# Patient Record
Sex: Male | Born: 1935
Health system: Southern US, Community
[De-identification: ages and names within clinical notes are randomized; demographics above are authoritative.]

## PROBLEM LIST (undated history)

## (undated) DIAGNOSIS — M1990 Unspecified osteoarthritis, unspecified site: Secondary | ICD-10-CM

## (undated) DIAGNOSIS — R202 Paresthesia of skin: Secondary | ICD-10-CM

## (undated) DIAGNOSIS — K449 Diaphragmatic hernia without obstruction or gangrene: Secondary | ICD-10-CM

## (undated) DIAGNOSIS — I10 Essential (primary) hypertension: Secondary | ICD-10-CM

## (undated) DIAGNOSIS — H5440 Blindness, one eye, unspecified eye: Secondary | ICD-10-CM

## (undated) DIAGNOSIS — K4090 Unilateral inguinal hernia, without obstruction or gangrene, not specified as recurrent: Secondary | ICD-10-CM

## (undated) DIAGNOSIS — I779 Disorder of arteries and arterioles, unspecified: Secondary | ICD-10-CM

## (undated) DIAGNOSIS — H409 Unspecified glaucoma: Secondary | ICD-10-CM

## (undated) DIAGNOSIS — K3184 Gastroparesis: Secondary | ICD-10-CM

## (undated) DIAGNOSIS — K5792 Diverticulitis of intestine, part unspecified, without perforation or abscess without bleeding: Secondary | ICD-10-CM

## (undated) DIAGNOSIS — E785 Hyperlipidemia, unspecified: Secondary | ICD-10-CM

## (undated) DIAGNOSIS — K5900 Constipation, unspecified: Secondary | ICD-10-CM

## (undated) DIAGNOSIS — F17200 Nicotine dependence, unspecified, uncomplicated: Secondary | ICD-10-CM

## (undated) DIAGNOSIS — R200 Anesthesia of skin: Secondary | ICD-10-CM

## (undated) DIAGNOSIS — N529 Male erectile dysfunction, unspecified: Secondary | ICD-10-CM

## (undated) DIAGNOSIS — I519 Heart disease, unspecified: Secondary | ICD-10-CM

## (undated) DIAGNOSIS — R269 Unspecified abnormalities of gait and mobility: Secondary | ICD-10-CM

## (undated) DIAGNOSIS — N3281 Overactive bladder: Secondary | ICD-10-CM

## (undated) DIAGNOSIS — M5410 Radiculopathy, site unspecified: Secondary | ICD-10-CM

## (undated) DIAGNOSIS — G4730 Sleep apnea, unspecified: Secondary | ICD-10-CM

## (undated) DIAGNOSIS — K219 Gastro-esophageal reflux disease without esophagitis: Secondary | ICD-10-CM

## (undated) DIAGNOSIS — G4700 Insomnia, unspecified: Secondary | ICD-10-CM

## (undated) DIAGNOSIS — R413 Other amnesia: Secondary | ICD-10-CM

## (undated) DIAGNOSIS — I739 Peripheral vascular disease, unspecified: Secondary | ICD-10-CM

## (undated) DIAGNOSIS — R0602 Shortness of breath: Secondary | ICD-10-CM

## (undated) DIAGNOSIS — K635 Polyp of colon: Secondary | ICD-10-CM

## (undated) DIAGNOSIS — M5030 Other cervical disc degeneration, unspecified cervical region: Secondary | ICD-10-CM

## (undated) DIAGNOSIS — I639 Cerebral infarction, unspecified: Secondary | ICD-10-CM

## (undated) HISTORY — DX: Peripheral vascular disease, unspecified: I73.9

## (undated) HISTORY — DX: Radiculopathy, site unspecified: M54.10

## (undated) HISTORY — DX: Nicotine dependence, unspecified, uncomplicated: F17.200

## (undated) HISTORY — DX: Hyperlipidemia, unspecified: E78.5

## (undated) HISTORY — DX: Male erectile dysfunction, unspecified: N52.9

## (undated) HISTORY — PX: CERVICAL FUSION: SHX112

## (undated) HISTORY — DX: Diverticulitis of intestine, part unspecified, without perforation or abscess without bleeding: K57.92

## (undated) HISTORY — DX: Polyp of colon: K63.5

## (undated) HISTORY — DX: Insomnia, unspecified: G47.00

## (undated) HISTORY — DX: Anesthesia of skin: R20.0

## (undated) HISTORY — DX: Overactive bladder: N32.81

## (undated) HISTORY — PX: HERNIA REPAIR: SHX51

## (undated) HISTORY — DX: Diaphragmatic hernia without obstruction or gangrene: K44.9

## (undated) HISTORY — DX: Unspecified abnormalities of gait and mobility: R26.9

## (undated) HISTORY — DX: Gastroparesis: K31.84

## (undated) HISTORY — PX: COLONOSCOPY: SHX174

## (undated) HISTORY — DX: Other cervical disc degeneration, unspecified cervical region: M50.30

## (undated) HISTORY — DX: Blindness, one eye, unspecified eye: H54.40

## (undated) HISTORY — DX: Other amnesia: R41.3

## (undated) HISTORY — DX: Unspecified osteoarthritis, unspecified site: M19.90

## (undated) HISTORY — DX: Unilateral inguinal hernia, without obstruction or gangrene, not specified as recurrent: K40.90

## (undated) HISTORY — DX: Heart disease, unspecified: I51.9

## (undated) HISTORY — DX: Disorder of arteries and arterioles, unspecified: I77.9

## (undated) HISTORY — DX: Paresthesia of skin: R20.2

---

## 1999-08-29 ENCOUNTER — Ambulatory Visit (HOSPITAL_COMMUNITY): Admission: AD | Admit: 1999-08-29 | Discharge: 1999-08-30 | Payer: Self-pay | Admitting: Ophthalmology

## 1999-08-29 ENCOUNTER — Encounter: Payer: Self-pay | Admitting: Ophthalmology

## 2000-09-10 ENCOUNTER — Encounter: Payer: Self-pay | Admitting: General Surgery

## 2000-09-10 ENCOUNTER — Ambulatory Visit (HOSPITAL_COMMUNITY): Admission: RE | Admit: 2000-09-10 | Discharge: 2000-09-10 | Payer: Self-pay | Admitting: General Surgery

## 2000-12-28 ENCOUNTER — Encounter: Payer: Self-pay | Admitting: Family Medicine

## 2000-12-28 ENCOUNTER — Ambulatory Visit (HOSPITAL_COMMUNITY): Admission: RE | Admit: 2000-12-28 | Discharge: 2000-12-28 | Payer: Self-pay | Admitting: Family Medicine

## 2001-02-01 ENCOUNTER — Ambulatory Visit (HOSPITAL_COMMUNITY): Admission: RE | Admit: 2001-02-01 | Discharge: 2001-02-01 | Payer: Self-pay | Admitting: Family Medicine

## 2001-02-01 ENCOUNTER — Encounter: Payer: Self-pay | Admitting: Family Medicine

## 2001-02-03 ENCOUNTER — Ambulatory Visit (HOSPITAL_COMMUNITY): Admission: RE | Admit: 2001-02-03 | Discharge: 2001-02-03 | Payer: Self-pay | Admitting: Family Medicine

## 2001-02-03 ENCOUNTER — Encounter: Payer: Self-pay | Admitting: Family Medicine

## 2001-02-15 ENCOUNTER — Ambulatory Visit (HOSPITAL_COMMUNITY): Admission: RE | Admit: 2001-02-15 | Discharge: 2001-02-15 | Payer: Self-pay | Admitting: Family Medicine

## 2001-02-15 ENCOUNTER — Encounter: Payer: Self-pay | Admitting: Family Medicine

## 2001-04-29 ENCOUNTER — Emergency Department (HOSPITAL_COMMUNITY): Admission: EM | Admit: 2001-04-29 | Discharge: 2001-04-29 | Payer: Self-pay | Admitting: Emergency Medicine

## 2002-04-14 ENCOUNTER — Ambulatory Visit (HOSPITAL_COMMUNITY): Admission: RE | Admit: 2002-04-14 | Discharge: 2002-04-14 | Payer: Self-pay | Admitting: Internal Medicine

## 2002-04-14 HISTORY — PX: ESOPHAGOGASTRODUODENOSCOPY: SHX1529

## 2002-07-08 ENCOUNTER — Ambulatory Visit (HOSPITAL_COMMUNITY): Admission: RE | Admit: 2002-07-08 | Discharge: 2002-07-08 | Payer: Self-pay | Admitting: Family Medicine

## 2002-07-08 ENCOUNTER — Encounter: Payer: Self-pay | Admitting: Family Medicine

## 2002-08-04 ENCOUNTER — Ambulatory Visit (HOSPITAL_COMMUNITY): Admission: RE | Admit: 2002-08-04 | Discharge: 2002-08-04 | Payer: Self-pay | Admitting: Internal Medicine

## 2002-08-04 HISTORY — PX: COLONOSCOPY: SHX174

## 2003-10-16 ENCOUNTER — Ambulatory Visit (HOSPITAL_COMMUNITY): Admission: RE | Admit: 2003-10-16 | Discharge: 2003-10-16 | Payer: Self-pay | Admitting: Neurology

## 2003-11-16 ENCOUNTER — Ambulatory Visit (HOSPITAL_COMMUNITY): Admission: RE | Admit: 2003-11-16 | Discharge: 2003-11-16 | Payer: Self-pay | Admitting: Family Medicine

## 2003-11-22 ENCOUNTER — Emergency Department (HOSPITAL_COMMUNITY): Admission: EM | Admit: 2003-11-22 | Discharge: 2003-11-22 | Payer: Self-pay | Admitting: Emergency Medicine

## 2003-12-02 ENCOUNTER — Ambulatory Visit (HOSPITAL_COMMUNITY): Admission: RE | Admit: 2003-12-02 | Discharge: 2003-12-02 | Payer: Self-pay | Admitting: Orthopaedic Surgery

## 2004-01-22 ENCOUNTER — Ambulatory Visit (HOSPITAL_COMMUNITY): Admission: RE | Admit: 2004-01-22 | Discharge: 2004-01-22 | Payer: Self-pay | Admitting: Family Medicine

## 2004-04-22 ENCOUNTER — Ambulatory Visit (HOSPITAL_COMMUNITY): Admission: RE | Admit: 2004-04-22 | Discharge: 2004-04-22 | Payer: Self-pay | Admitting: Neurology

## 2004-04-26 ENCOUNTER — Inpatient Hospital Stay (HOSPITAL_COMMUNITY): Admission: RE | Admit: 2004-04-26 | Discharge: 2004-04-27 | Payer: Self-pay | Admitting: Neurosurgery

## 2004-06-02 ENCOUNTER — Encounter: Admission: RE | Admit: 2004-06-02 | Discharge: 2004-06-02 | Payer: Self-pay | Admitting: Neurosurgery

## 2004-12-22 ENCOUNTER — Ambulatory Visit (HOSPITAL_COMMUNITY): Admission: RE | Admit: 2004-12-22 | Discharge: 2004-12-22 | Payer: Self-pay | Admitting: Family Medicine

## 2006-03-15 ENCOUNTER — Ambulatory Visit: Payer: Self-pay | Admitting: Internal Medicine

## 2006-04-19 ENCOUNTER — Ambulatory Visit (HOSPITAL_COMMUNITY): Admission: RE | Admit: 2006-04-19 | Discharge: 2006-04-19 | Payer: Self-pay | Admitting: *Deleted

## 2006-04-23 ENCOUNTER — Ambulatory Visit: Payer: Self-pay | Admitting: Internal Medicine

## 2006-07-23 ENCOUNTER — Ambulatory Visit: Payer: Self-pay | Admitting: Orthopedic Surgery

## 2006-07-25 DIAGNOSIS — Z8679 Personal history of other diseases of the circulatory system: Secondary | ICD-10-CM | POA: Insufficient documentation

## 2006-07-27 ENCOUNTER — Ambulatory Visit (HOSPITAL_COMMUNITY): Admission: RE | Admit: 2006-07-27 | Discharge: 2006-07-27 | Payer: Self-pay | Admitting: Orthopedic Surgery

## 2006-08-06 ENCOUNTER — Ambulatory Visit: Payer: Self-pay | Admitting: Orthopedic Surgery

## 2006-08-12 ENCOUNTER — Emergency Department (HOSPITAL_COMMUNITY): Admission: EM | Admit: 2006-08-12 | Discharge: 2006-08-12 | Payer: Self-pay | Admitting: Emergency Medicine

## 2006-10-29 ENCOUNTER — Ambulatory Visit (HOSPITAL_COMMUNITY): Admission: RE | Admit: 2006-10-29 | Discharge: 2006-10-29 | Payer: Self-pay | Admitting: Family Medicine

## 2007-08-26 ENCOUNTER — Ambulatory Visit (HOSPITAL_COMMUNITY): Admission: RE | Admit: 2007-08-26 | Discharge: 2007-08-26 | Payer: Self-pay | Admitting: Family Medicine

## 2008-09-08 ENCOUNTER — Encounter: Admission: RE | Admit: 2008-09-08 | Discharge: 2008-09-08 | Payer: Self-pay | Admitting: Specialist

## 2008-09-09 DIAGNOSIS — K5900 Constipation, unspecified: Secondary | ICD-10-CM

## 2008-09-09 DIAGNOSIS — Z8673 Personal history of transient ischemic attack (TIA), and cerebral infarction without residual deficits: Secondary | ICD-10-CM

## 2008-09-09 DIAGNOSIS — I10 Essential (primary) hypertension: Secondary | ICD-10-CM | POA: Insufficient documentation

## 2008-09-09 DIAGNOSIS — F17200 Nicotine dependence, unspecified, uncomplicated: Secondary | ICD-10-CM

## 2008-11-19 ENCOUNTER — Encounter: Admission: RE | Admit: 2008-11-19 | Discharge: 2008-11-19 | Payer: Self-pay | Admitting: Neurology

## 2009-05-27 ENCOUNTER — Ambulatory Visit: Payer: Self-pay | Admitting: Internal Medicine

## 2009-05-27 DIAGNOSIS — K219 Gastro-esophageal reflux disease without esophagitis: Secondary | ICD-10-CM

## 2009-05-27 DIAGNOSIS — R1310 Dysphagia, unspecified: Secondary | ICD-10-CM | POA: Insufficient documentation

## 2009-05-27 DIAGNOSIS — R198 Other specified symptoms and signs involving the digestive system and abdomen: Secondary | ICD-10-CM

## 2009-06-09 ENCOUNTER — Ambulatory Visit: Payer: Self-pay | Admitting: Internal Medicine

## 2009-06-09 ENCOUNTER — Ambulatory Visit (HOSPITAL_COMMUNITY): Admission: RE | Admit: 2009-06-09 | Discharge: 2009-06-09 | Payer: Self-pay | Admitting: Internal Medicine

## 2009-06-09 HISTORY — PX: ESOPHAGOGASTRODUODENOSCOPY: SHX1529

## 2009-06-11 ENCOUNTER — Encounter: Payer: Self-pay | Admitting: Internal Medicine

## 2009-07-08 ENCOUNTER — Encounter: Payer: Self-pay | Admitting: Internal Medicine

## 2010-02-01 NOTE — Letter (Signed)
Summary: tcs/egd/ed order  tcs/egd/ed order   Imported By: Ave Filter 05/27/2009 11:51:10  _____________________________________________________________________  External Attachment:    Type:   Image     Comment:   External Document

## 2010-02-01 NOTE — Letter (Signed)
Summary: Patient Notice, Colon Biopsy Results  [facility]  [address]   [place], [place] [address]   Phone: [phone]  Fax: [phone]       June 11, 2009   CHESKY HEYER [address] [facility] [place], [place]  [address] 05-Jun-1934    Dear Mr. Tenorio,  I am pleased to inform you that the biopsies taken during your recent colonoscopy did not show any evidence of cancer upon pathologic examination.  Additional information/recommendations:  You should have a repeat colonoscopy examination  in 3 years.  Please call us if you are having persistent problems or have questions about your condition that have not been fully answered at this time.  Sincerely,    R. Roetta Sessions MD, FACP [facility] Ph: [phone]    Fax: [phone]   Appended Document: Patient Notice, Colon Biopsy Results letter mailed to pt  Appended Document: Patient Notice, Colon Biopsy Results 52yr TCS reminder appt made- cdg

## 2010-02-01 NOTE — Assessment & Plan Note (Signed)
Summary: DSYPHAGIA/SS   Primary Care Provider:  Sherryll Burger  Chief Complaint:  dysphagia/worsening constipation.  History of Present Illness: [name] is a pleasant 76 y/o AA male, who preents today for further evaluation of dysphagia, worsening constipation. He had EGD/ED in 2004. Gradually over last year he has increased difficulty swallowing dry foods and meats. Feels like food is sticking. No typical gerd on omeprazole. He has intermittent vague substernal chest discomfort which radiated throughout chest at rest. Denies diaphoresis or exertional component. Denies cough. Denies abd pain but feels bloated. BM once per week. Takes Epson salt as needed. Tried colace 3 daily without results. Used to be on Amitiza with good control. No melena, brbpr. No weight loss.     Current Medications (verified): 1)  Docusate Sodium 100 Mg Tabs (Docusate Sodium) .... 3 As Needed, But Not Working So Stopped 2)  Furosemide 20 Mg Tabs (Furosemide) .... Take 1 Tablet By Mouth Once A Day 3)  Metoprolol Tartrate 25 Mg Tabs (Metoprolol Tartrate) .... Take 1 Tablet By Mouth Two Times A Day 4)  Omeprazole 20 Mg Cpdr (Omeprazole) .... Take 1 Tablet By Mouth Two Times A Day 5)  Klor-Con 20 Meq Pack (Potassium Chloride) .... Take 1 Tablet By Mouth Once A Day 6)  Aspir-Low 81 Mg Tbec (Aspirin) .... Take 1 Tablet By Mouth Once A Day 7)  Clonazepam 0.5 Mg Tabs (Clonazepam) .... Take 1 Tablet By Mouth Two Times A Day 8)  Simvastatin 20 Mg Tabs (Simvastatin) .... Take 1 Tablet By Mouth Once A Day  Allergies (verified): 1)  ! Aspirin  Past History:  Past Medical History: Chronic constipation Hypertension GERD CVA TCS, 2004-->pancolonic diverticula, inflammatory polyps EGD, 2004-->Schatzki ring, s/p dilation 60F, focal antral erosions Hyperlipidemia  Past Surgical History: Reviewed history from 09/09/2008 and no changes required. RIGHT INGUINAL HERNIA REPAIR VERTEBRA REPAIRED IN HIS NECK  Family  History: Mother and Father deceased, unknown cause. No FH chronic GI illnesses, CRC, liver disease. Brothers, 2, had either prostate or crc.  Social History: Married. 2 children. Retired. Smokes 5 cig/day. No alcohol or drugs.   Review of Systems General:  Complains of weakness; denies fever, chills, sweats, anorexia, fatigue, and weight loss; left-sided, prior cva. Eyes:  Denies vision loss. ENT:  Complains of difficulty swallowing; denies nasal congestion, sore throat, and hoarseness. CV:  Denies chest pains, angina, palpitations, dyspnea on exertion, and peripheral edema. Resp:  Denies dyspnea at rest, dyspnea with exercise, and cough. GI:  See HPI. GU:  Denies urinary burning and blood in urine. MS:  Denies joint pain / LOM. Derm:  Denies rash and itching. Neuro:  Complains of weakness; denies frequent headaches, memory loss, and confusion. Psych:  Denies depression and anxiety. Endo:  Denies unusual weight change. Heme:  Denies bruising and bleeding. Allergy:  Denies hives and rash.  Vital Signs:  Patient profile:   76 year old male Height:      69 inches Weight:      195 pounds BMI:     28.90 Temp:     98.5 degrees F oral Pulse rate:   80 / minute BP sitting:   158 / 80  (left arm) Cuff size:   regular  Vitals Entered By: Cloria Spring LPN (May 27, 2009 10:42 AM)  Physical Exam  General:  Well developed, well nourished, no acute distress. Head:  Normocephalic and atraumatic. Eyes:  Conjunctivae pink, no scleral icterus.  Mouth:  Oropharyngeal mucosa moist, pink.  No lesions, erythema  or exudate.   poor dentition.   Neck:  Supple; no masses or thyromegaly. Lungs:  Clear throughout to auscultation. Heart:  Regular rate and rhythm; no murmurs, rubs,  or bruits. Abdomen:  Bowel sounds normal.  Abdomen is soft, nontender, nondistended.  No rebound or guarding.  No hepatosplenomegaly, masses or hernias.  No abdominal bruits.  Rectal:  deferred until time of colonoscopy.    Extremities:  No clubbing, cyanosis, edema or deformities noted. Neurologic:  Alert and  oriented x4  Skin:  Intact without significant lesions or rashes. Psych:  Alert and cooperative. Normal mood and affect.  Impression & Recommendations:  Problem # 1:  DYSPHAGIA UNSPECIFIED (ICD-787.20)  Recurrent dysphagia, h/o remote Schatzki ring s/p dilation in 2004. He has chronic controlled GERD. Recommend EGD/ED. EGD/ED to be performed in near future.  Risks, alternatives, benefits including but not limited to risk of reaction to medications, bleeding, infection, and perforation addressed.  Patient voiced understanding and verbal consent obtained.   Orders: Est. Patient Level IV (09811)  Problem # 2:  CHANGE IN BOWELS (ICD-787.99)  Worsening constipation. BM once per week with use of Epson Salt. Last TCS in 2004, inflammatory polyps. No clear FH of CRC. Given change in bowels, repeat TCS at this time. He will have modified prep for severe constipation. Colonoscopy to be performed in near future.  Risks, alternatives, and benefits including but not limited to the risk of reaction to medication, bleeding, infection, and perforation were addressed.  Patient voiced understanding and provided verbal consent. Will start Amitiza by mouth two times a day with food. RX and samples provided.   Orders: Est. Patient Level IV (91478)  Problem # 3:  Hx of CVA (ICD-434.91) He will be maintained on ASA for procedures. Prescriptions: AMITIZA 24 MCG CAPS (LUBIPROSTONE) one by mouth two times a day with food  #60 x 5   Entered and Authorized by:   Leanna Battles. Dixon Boos   Signed by:   Leanna Battles Joelly Bolanos PA-C on 05/27/2009   Method used:   Electronically to        [facility] (retail)       [address]       [place], [place]  [address]       Ph: [phone]       Fax: [phone]   RxID:   [id number]

## 2010-05-03 ENCOUNTER — Other Ambulatory Visit: Payer: Self-pay | Admitting: Family Medicine

## 2010-05-03 DIAGNOSIS — K219 Gastro-esophageal reflux disease without esophagitis: Secondary | ICD-10-CM

## 2010-05-03 DIAGNOSIS — K222 Esophageal obstruction: Secondary | ICD-10-CM

## 2010-05-05 ENCOUNTER — Other Ambulatory Visit: Payer: Self-pay | Admitting: Family Medicine

## 2010-05-05 DIAGNOSIS — M545 Low back pain: Secondary | ICD-10-CM

## 2010-05-06 ENCOUNTER — Ambulatory Visit
Admission: RE | Admit: 2010-05-06 | Discharge: 2010-05-06 | Disposition: A | Discharge status: Home / Self Care | Payer: No Typology Code available for payment source | Source: Ambulatory Visit | Attending: Family Medicine | Admitting: Family Medicine

## 2010-05-06 ENCOUNTER — Other Ambulatory Visit: Payer: Self-pay | Admitting: Diagnostic Radiology

## 2010-05-06 DIAGNOSIS — M545 Low back pain: Secondary | ICD-10-CM

## 2010-05-12 ENCOUNTER — Ambulatory Visit
Admission: RE | Admit: 2010-05-12 | Discharge: 2010-05-12 | Disposition: A | Discharge status: Home / Self Care | Payer: No Typology Code available for payment source | Source: Ambulatory Visit | Attending: Family Medicine | Admitting: Family Medicine

## 2010-05-12 ENCOUNTER — Other Ambulatory Visit: Payer: Self-pay | Admitting: Family Medicine

## 2010-05-12 DIAGNOSIS — R52 Pain, unspecified: Secondary | ICD-10-CM

## 2010-05-12 DIAGNOSIS — K222 Esophageal obstruction: Secondary | ICD-10-CM

## 2010-05-12 DIAGNOSIS — K219 Gastro-esophageal reflux disease without esophagitis: Secondary | ICD-10-CM

## 2010-05-19 ENCOUNTER — Other Ambulatory Visit: Payer: Self-pay | Admitting: Orthopaedic Surgery

## 2010-05-19 DIAGNOSIS — M4800 Spinal stenosis, site unspecified: Secondary | ICD-10-CM

## 2010-05-20 NOTE — Consult Note (Signed)
[place]  Patient:    [name], [name] Visit Number: [id number] MRN: [id number]          Service Type: EMS Location: [facility] Attending Physician:  Shelba Flake Dictated by:   Stefani Dama, M.D. Proc. Date: 04/29/01 Admit Date:  04/29/2001   CC:         Lizzie Giovanni, M.D.   Consultation Report  REQUESTER:  Earlyne Iba, M.D.  REASON FOR REQUEST:  Right shoulder pain, herniated nucleus pulposus.  HISTORY OF PRESENT ILLNESS:  Patient is a 76 year old individual who has had a previous history of a stroke.  He is followed by Dr. Gareth Morgan in [place].  Patient had an MRI of the cervical spine performed in February 2003 and has demonstrated presence of severe degenerative disk disease at C5-6 and also at C6-7 with a herniated nucleus pulposus at T6-7 central and accentric to the right side.  Patient has been having shoulder and right arm pain with numbness into the fingers.  Over the past week or two it has become more acute.  He had seen Dr. Sudie Bailey last week and was given a course of prednisone.  Patient continued to complain of significant pain and was experiencing weakness in the right hand with decreased grip, decreased ability to reach his arm above his head.  He was sent to the emergency room today for further evaluation with his films.  PAST MEDICAL HISTORY:  The patient has had a previous history of a stroke involving weakness of the left side.  This was about a year ago in January. He has been recovering since.  He has been unemployed.  He is on medicare and medicaid.  REVIEW OF SYSTEMS:  Notable for a mild history of hypertension.  Also, history of gastroesophageal reflux disease and he is on Nexium 40 mg q.d.  He also takes an aspirin a day.  He has recently been on prednisone and is currently taking 20 mg q.d. on tapering doses.  SOCIAL HISTORY:  He is married.  On medicare and unemployed secondary to  a history of a stroke.  PHYSICAL EXAMINATION  GENERAL:  He is an alert, oriented, cooperative individual in no overt distress.  HEENT:  Pupils are 4 mm, briskly reactive to light and accommodation. Extraocular movements are full.  Face is symmetric to grimace.  Tongue and uvula are in the midline.  Sclerae and conjunctivae are clear.  NECK:  No masses.  No bruits are heard.  Range of motion is limited to 45 degrees to the right, 60 degrees to the left.  He extends and flexes normally. Axial compression reproduces no pain.  There is no moderate tenderness in the supraclavicular fossa on the right-hand side.  NEUROLOGIC:  His motor strength in the deltoid is 5/5 bilaterally.  Biceps strength is slightly graded down to 4/5 on the right side compared to the left.  Triceps, however, is weak at 4/5 with some slight suggestion of atrophy on the right side compared to the left.  Deep tendon reflexes are absent at the biceps and triceps on the right, 1+ in the biceps and triceps on the left, 2+ in both patellae, 2+ in both Achilles.  Babinskis upgoing on the left side.  Sensation is diminished to ______ and vibration in distal right upper extremity, particularly towards the ulnar aspect of the hands.  IMPRESSION:  The patient has evidence of disk degeneration and herniation at C5-6 and C6-7.  I offered to admit  the patient to the hospital to undergo anterior discectomy and arthrodesis as this process has been going on for several months and his films are now two months old.  The patient boffed at this, indicating that he is not ready to consider surgical intervention at this time.  I indicated that he is undergoing a proper course of conservative management at this time.  However, if he needs to consider surgical intervention he would have to be admitted through the emergency department. I indicated that our practice does not participate in the medicaid program at this time.  He wishes to  consider his treatment options further.  I discussed with him in great detail the approach to the anterior portion of the neck dissecting between the carotid artery and jugular vein and reflecting his esophagus and trachea off to one side.  I indicated the nature of the procedure by removing the entire disk and placing a bone graft in between and a plate over the vertebrae to fuse them together.  I indicated the major risks of the surgery including nerve root injury, potential for spinal cord injury, and risks of infection and bleeding.  Patient understands all these things. However, he does not wish to consider surgical intervention at this time. Dictated by:   Stefani Dama, M.D. Attending Physician:  Shelba Flake DD:  04/29/01 TD:  04/30/01 Job: [id number]

## 2010-05-20 NOTE — Op Note (Signed)
[place]  Patient:    Benjamin, [name]                           MRN: [id number] Proc. Date: 08/29/99 Adm. Date:  [id number] Disc. Date: [id number] Attending:  Ernesto Rutherford                           Operative Report  PREOPERATIVE DIAGNOSIS: 1. 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 2. 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 3. 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  POSTOPERATIVE DIAGNOSIS: 1. 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 2. 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 3. 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  OPERATION PERFORMED: 1. Posterior vitrectomy. 2. Removal of foreign body--nonmagnetic left eye into vitreous cavity.  SURGEON:  Ernesto Rutherford, M.D.  ANESTHESIA:  General endotracheal.  INDICATIONS FOR PROCEDURE:  The patient is a 76 year old man who has intraoccular pressure elevated to 50 to 51 with acute of acute pain and redness iver the last two to three days.  He has had a history of trauma some 20 to 25 years previously with spontaneous dislocation into the vitreous cavity of his lens.  Previous examination in my office some two years ago, disclosed no troubles from this lens asymptomatic and inflammatory problems. The options had been discussed at that time and the patient opted not to proceed with any intervention.  Now the patient has complications developed which necessitates removal of the inciting inflammatory factor, in this case the lens and its lens induced proteins that are being released in addition to possible macrophages in the form of phacoanaphylactic changes.  At this time the patient has an intraoccular pressure of 50.  The patient has been examined earlier today by Dr. Georgina Pillion in [place].  The patient understands that this is an attempt to quiet the inflammation as well as to remove the lens.  The patient  understands the risks of anesthesia, including the rare occurrence of death, but also to the eye including hemorrhage, infection, scarring, need for another surgery, no change in vision, loss of vision, progressive disease despite intervention.  After appropriate signed consent was obtained, the patient was taken to operating room.  DESCRIPTION OF PROCEDURE:  In the operating room initially local retrobulbar anesthesia with monitored anesthesia control was attempted.  Under sedation 0.75% Marcaine was delivered to the left eye.  The patient tolerated this portion of the procedure very well.  However, after sterile prep and draped in the usual ophthalmic fashion the left eye, the patient became combative, restless and moving and thus necessitating conversion to general anesthesia before the direct incision to the sclera had been made.  At this time, general endotracheal anesthesia was then started.  The left periocular region sterilely prepped and draped again.  Conjunctival incisions were made superiorly.  Decision was made to use a Lewicky anterior chamber maintainer to maintain clarity of the anterior segment.  Corneal cloudiness began to subside almost immediately.  Provisc was then used into the anterior chamber to protect the endothelium.  Superior sclerotomy was fashioned.  A Wild microscope placed into position with Biom attached.  Core vitrectomy was then begun.  Notable findings were of diffuse cortical type flaky material 360 degrees in the vitreous cavity  and the anterior chamber.  A dark brown ____________ leathery type nucleus was also noted.  All this was removed with phacofragmentation.  Scleral depression was then used 360 degrees to remove all visible lens fragments.  Notable findings were that of a small dot hemorrhage superotemporal arcade presumably made from a lens fragment bumping off the phacoprobe.  At one point the patient did buck against the endotracheal tube;  however, my palms and hands resting on his forehead prevented any direct contact with the instruments that were in the eye at that time.  Careful inspection and continuance of the procedure did not disclose any direct consequences to the eye or the ocular structures.  At this time, reinspection and completion of vitrectomy was then noted.  All vitreous had been trimmed 360 degrees.  Sclerotomies were then closed with 7-0 Vicryl suture.  The infusion was removed.  No stitch was necessary in the cornea.  Subconjunctival injections of antibiotics and steroids applied after the conjunctiva had been closed with 7-0 Vicryl suture.  Patient awakened from anesthesia in good and stable condition and taken to the recovery room. DD:  08/29/99 TD:  08/30/99 Job: [id number]

## 2010-05-20 NOTE — Consult Note (Signed)
NAME:  [name], EWINGS NO.:  [id number]   MEDICAL RECORD NO.:  [id number]                   PATIENT TYPE:  AMB   LOCATION:                                       FACILITY:  [facility]   PHYSICIAN:  R. Roetta Sessions, M.D.              DATE OF BIRTH:  July 14, 1934   DATE OF CONSULTATION:  04/04/2002  DATE OF DISCHARGE:                                   CONSULTATION   REQUESTING PHYSICIAN:  Mila Homer. Sudie Bailey, M.D.   REASON FOR CONSULTATION:  Dysphagia, vomiting.   HISTORY OF PRESENT ILLNESS:  The patient is a 76 year old black gentleman  patient of Dr. Sudie Bailey who presents today for further evaluation of the  above-stated symptoms.  The patient states for the last two years since his  stroke he has had difficulties with swallowing.  He feels like the food is  getting stuck in his upper esophagus.  Sometimes it feels like his esophagus  fills completely up and then he develops nausea and vomiting.  He denies any  esophageal pain.  He does have typical heartburn symptoms which are well  controlled on Prevacid.  He denies any abdominal pain, melena, rectal  bleeding, diarrhea.  He says his bowels are moving slower than they used to.  He generally has a bowel movement two to three times weekly.  He has never  had a colonoscopy or upper endoscopy.   CURRENT MEDICATIONS:  1. Aspirin 81 mg daily.  2. Detrol 4 mg daily.  3. Hydrochlorothiazide 50 mg daily.  4. Ibuprofen 800 mg p.r.n.  5. Hydrocodone 7.5/350 mg p.r.n.  6. Meclizine 25 mg daily.  7. Prevacid 30 mg daily.   ALLERGIES:  ADULT ASPIRIN causes increased reflux.   PAST MEDICAL HISTORY:  1. Hypertension.  2. History of stroke two years ago with left-sided hemiparesis.  3. Weak bladder.  4. Right inguinal hernia repair.   FAMILY HISTORY:  Negative for chronic GI illnesses or colorectal cancer.   SOCIAL HISTORY:  Has been married for 13 years.  Has two children.  He is  retired.  He smokes a half  a pack of cigarettes daily and has smoked since  age 40.  Denies any alcohol use.   REVIEW OF SYSTEMS:  Please see HPI for GI.  GENERAL:  Denies any weight  loss.  CARDIOPULMONARY:  Denies any chest pain or shortness of breath.   PHYSICAL EXAMINATION:  VITAL SIGNS:  Weight 188, height 5 feet 10 inches.  Temperature 97.4, blood pressure 150/80, pulse 78.  GENERAL:  Pleasant, well-developed, well-nourished black male in no acute  distress.  SKIN:  Warm and dry, no jaundice.  HEENT:  Conjunctivae are pink, sclerae nonicteric.  Oropharyngeal mucosa  moist and pink.  No lesions, erythema, or exudate.  He has multiple teeth  missing.  No lymphadenopathy or thyromegaly.  CHEST:  Lungs reveal scattered expiratory  wheezes.  CARDIAC:  Reveals regular rate and rhythm, normal S1, S2.  No murmurs, rubs,  or gallops.  ABDOMEN:  Positive bowel sounds, soft, nontender, nondistended.  No  organomegaly or masses.  EXTREMITIES:  No edema.   IMPRESSION:  The patient is a pleasant 76 year old gentleman who has a two-  year history of dysphagia.  He states that food becomes lodged in his upper  esophagus and this sometimes induces vomiting.  Would be concerned about  esophageal web ring or stricture.  He is a chronic smoker which does  increase his risk of esophageal cancer.  He has typical acid reflux symptoms  which appear to be well controlled on Prevacid.   He has constipation.  He has never had a colonoscopy.  I have recommended  one at some point in the near future for screening purposes.   PLAN:  1. EGD with dilatation in the near future.  2. He will continue Prevacid 30 mg daily.  3. Fiber supplement daily - Metamucil or Citrucel; samples of each given.  4. MiraLax 17 grams p.o. daily #527 grams with three refills given.  He was     asked to try the fiber first and if he did not increase frequency of his     bowel movements he can go ahead and begin MiraLax.  5. Recommend colonoscopy at some  point in the near future.   I would like to thank Dr. Sudie Bailey for allowing [place] to take part in the care  of this patient.     Tana Coast, Pricilla Larsson, M.D.    LL/MEDQ  D:  04/04/2002  T:  04/05/2002  Job:  [id number]   cc:   Mila Homer. Sudie Bailey, M.D.  [address] [place], [place] [address]  Fax: [phone]

## 2010-05-20 NOTE — Procedures (Signed)
NAME:  GREGORIO, WORLEY NO.:  [id number]   MEDICAL RECORD NO.:  [id number]           PATIENT TYPE:   LOCATION:                                FACILITY:  [facility]   PHYSICIAN:  Edward L. Juanetta Gosling, M.D.DATE OF BIRTH:  [birth year]   DATE OF PROCEDURE:  DATE OF DISCHARGE:                              PULMONARY FUNCTION TEST   1.  Spirometry shows a mild ventilatory defect, and the flow volume loop is      somewhat rounded.  I am not certain if he did not get a good effort on      the forced vital capacity maneuver.  2.  Lung volumes show mild reduction in total lung capacity, suggesting a      restrictive lung disease.  3.  DLCO is moderately reduced.  4.  If extrathoracic flow obstruction such as tracheal stenosis is a serious      consideration, CT of the chest and tracheal area may be of help.  I      suspect that this may be effort-induced, however.                                               ______________________________  Oneal Deputy. Juanetta Gosling, M.D.  Electronically Signed 01/25/2004 08:36:14 EST    ELH/MEDQ  D:  01/24/2004  T:  01/24/2004  Job:  [id number]

## 2010-05-20 NOTE — Op Note (Signed)
NAME:  OLUWASEUN, CREMER NO.:  [id number]   MEDICAL RECORD NO.:  [id number]          PATIENT TYPE:  INP   LOCATION:  2899                         FACILITY:  [facility]   PHYSICIAN:  Kathaleen Maser. Pool, M.D.    DATE OF BIRTH:  11-Oct-1934   DATE OF PROCEDURE:  04/26/2004  DATE OF DISCHARGE:                                 OPERATIVE REPORT   ATTENDING PHYSICIAN:  Julio Sicks, M.D.   SERVICE:  Neurosurgery.   PREOPERATIVE DIAGNOSIS:  C6-7 herniated nucleus pulposus with myelopathy.   POSTOPERATIVE DIAGNOSIS:  C6-7 herniated nucleus pulposus with myelopathy.   PROCEDURE NAME:  C6-7 anterior cervical diskectomy and fusion with allograft  anterior plating.   SURGEON:  Julio Sicks, M.D.   ASSISTANT:  Donalee Citrin, M.D.   ANESTHESIA:  General orotracheal.   INDICATION:  Mr. Legan is a 76 year old male with a history of progressive  spastic paraparesis and right upper extremity pain, paresthesias and  weakness.  Workup demonstrates evidence of an extremely large right-sided C6-  7 disk herniation with marked spinal cord compression.  Patient has been  counseled as to his options.  He has decided to proceed with a C6-7 anterior  cervical diskectomy and fusion with allograft anterior plating in hopes of  improving his symptoms.   OPERATIVE NOTE:  The patient was brought to the operating room, placed in  the supine position.  After an adequate level of anesthesia was achieved,  the patient was positioned supine with his neck slightly extended and held  in place with Holter traction.  The patient's anterior cervical region was  prepped and draped sterilely.  A 10 blade was used to make a linear skin  incision overlying the C6-7 interspace.  This was carried down sharply to  the platysma.  The platysma was then divided vertically and dissection  proceeded along the medial border of the sternocleidomastoid muscle and  carotid sheath.  The trachea and esophagus were mobilized, directed  towards  the left.  Prevertebral fascia was stripped off the anterior spinal column.  The longus colli muscle was then elevated bilaterally using electrocautery.  A deep self-retaining retractor was placed.  Intraoperative fluoroscopy was  used and levels were confirmed.  Disk space at C6-7 was then incised, a 15  blade rectangular fashion.  A wide disk space clean out was then achieved  using pituitary rongeurs, forward and backward-angled Karlen curets,  Kerrison rongeurs and high speed drill.  All remnants of the disk were moved  out to the level of the posterior annulus.  A microscope was brought into  the field for used throughout the remainder of the diskectomy, remaining  aspects of the annulus and osteophytes removed using a high speed drill down  to the level of the posterior longitudinal ligament.  The posterior  longitudinal ligament was then elevated and resected in piecemeal fashion  using Kerrison rongeurs.  The underlying thecal sac was then identified.  A  large right-sided disk herniation was encountered and completely resected  using micro nerve hooks and pituitaries and Kerrison rongeurs.  The bodies  of C6 and C7 were then undercut using the Kerrison's to fully decompress the  spinal canal.  A wide anterior foraminotomy was then performed along the  course of the exiting C7 nerve roots bilaterally.  The wound was then  irrigated using antibiotic solution.  Hemostasis was found to be excellent.  A 5 mm Life-Net allograft wedge was then impacted in place, recessed  approximately 1 mm from the anterior cortical surface.  A 25 mm Atlantis  anterior cervical plate was then placed over the C6 and C7 levels.  This was  then attached under fluoroscopic guidance using 13 mm variable angle screws.  All four screws given final tightening and found to be solid in bone.  A  locking screw was then engaged in both levels.  Final images revealed good  position of bone grafts and  hardware, proper operative level with normal  alignment of the spine.  The wound was then irrigated with the antibiotic  solution.  Hemostasis was ensured with bipolar electrocautery.  The wound  was then closed in layers in a typical fashion.  Steri-Strips, sterile  dressing were applied.  There were no apparent complications.  Patient did  well and he returned to the recovery room postop.      HAP/MEDQ  D:  04/26/2004  T:  04/26/2004  Job:  [id number]

## 2010-05-20 NOTE — Op Note (Signed)
NAME:  [name], [name]                            ACCOUNT NO.:  [id number]   MEDICAL RECORD NO.:  [id number]                   PATIENT TYPE:  AMB   LOCATION:  DAY                                  FACILITY:  [facility]   PHYSICIAN:  R. Roetta Sessions, M.D.              DATE OF BIRTH:  [birth year]   DATE OF PROCEDURE:  DATE OF DISCHARGE:                                 OPERATIVE REPORT   PROCEDURE:  Colonoscopy and snare polypectomy and biopsy.   ENDOSCOPIST:  Gerrit Friends. Rourk, M.D.   INDICATIONS FOR PROCEDURE:  The patient is a 76 year old gentleman with  chronic constipation.  He comes for colonoscopy.  This approach has been  discussed with the patient at length.  The potential risks, benefits, and  alternatives have been reviewed; questions answered; he is agreeable.  Please see documentation in my July 23, 2002 for more information.   PROCEDURE NOTE:  O2 saturation, blood pressure, pulse and respirations were  monitored throughout the entire procedure.  Conscious sedation: Versed 3 mg IV, Demerol 75 mg IV in divided doses.   INSTRUMENT:  Olympus video chip adult colonoscope.   FINDINGS:  Digital rectal exam revealed no abnormalities.   ENDOSCOPIC FINDINGS:  The prep was good.   RECTUM:  Examination of the rectal mucosa including the retroflex view of  the anal verge revealed no abnormalities.   COLON:  The colonic mucosa was surveyed from the rectosigmoid junction  through the left transverse and right colon to the area of the appendiceal  orifice, ileocecal valve, and cecum.  These structures were well seen and  photographed for the record.  The patient had pancolonic diverticula and  polyps all the way to the cecum.  In the cecum he had a 2-mm polyp which was  cold biopsied/removed.  There was a 6-mm pedunculated polyp in the  midascending colon which was removed with cold snare technique.  In the  transverse colon there was a 4 mm polyp which was cold snared, and there was  another 4 mm polyp in the sigmoid which was also cold snared.  There were no  other abnormalities observed.  The patient tolerated the procedure well and  was reacted in endoscopy.   IMPRESSION:  1. Normal rectum.  2. Pancolonic diverticula.  3. Colonic polyps as described above, biopsied and/or snared.   RECOMMENDATIONS:  1. No aspirin or arthritis medications for 10 days.  2.     Follow up on path.  3. Follow up on pending calcium and TSH.  4. Further recommendations to follow.                                               Jonathon Bellows, M.D.    RMR/MEDQ  D:  08/04/2002  T:  08/04/2002  Job:  [id number]   cc:   Mila Homer. Sudie Bailey, M.D.  [address] [place], [place] [address]  Fax: [phone]

## 2010-05-20 NOTE — Op Note (Signed)
NAME:  [name], [name]                            ACCOUNT NO.:  [id number]   MEDICAL RECORD NO.:  [id number]                   PATIENT TYPE:  AMB   LOCATION:  DAY                                  FACILITY:  [facility]   PHYSICIAN:  R. Roetta Sessions, M.D.              DATE OF BIRTH:  Apr 02, 1934   DATE OF PROCEDURE:  04/14/2002  DATE OF DISCHARGE:                                 OPERATIVE REPORT   PROCEDURE PERFORMED:  Esophagogastroduodenoscopy with Elease Hashimoto dilation.   ENDOSCOPIST:  Jonathon Bellows, M.D.   INDICATIONS FOR PROCEDURE:  The patient is a 76 year old gentleman with  esophageal dysphagia to solids.  Esophagogastroduodenoscopy is now being  done to further evaluate his symptoms, his reflux symptoms were controlled  on Prevacid.  Procedure discussed with the patient previously, potential  risks, benefits and alternatives.  Please see my April 22, 2002 dictation.   Oxygen saturations, blood pressure, pulse and respirations were monitored  throughout the entire procedure.  Conscious sedation Versed 2 gm IV, Demerol  50 mg IV.  Olympus video gastroscope.  Cetacaine spray for topical  oropharyngeal anesthesia.   FINDINGS:  Examination of the tubular esophagus revealed a Schatzki's' ring.  Esophageal mucosa otherwise appeared normal.  Esophagogastric junction was  easily traversed.   Stomach:  Gastric cavity was empty and insufflated well with air.  Thorough  examination of the gastric mucosa including retroflex view in the proximal  stomach esophagogastric junction demonstrated only a small hiatal hernia,  Schatzki's' ring.  Except for the antrum where there were some focal  prepyloric antral erosions.  Please see photos.  Pylorus patent and easily  traversed.   Duodenum:  Bulb and second portion are normal.   Therapeutic/diagnostic maneuvers performed:  A 56 French Maloney dilator was  passed to full insertion with ease.  Subsequently 46 French Maloney dilator  was passed to  full insertion with slight resistance upon full insertion.  I  looked back, revealed the ring had been dilated without apparent  complication.   The patient tolerated the procedure well and was reacted in endoscopy unit.   IMPRESSION:  1. Schatzki's' ring, status post dilation as described above.  2. Hiatal hernia, focal antral erosions of uncertain clinical significance.     Remainder of gastric mucosa and duodenum to the second portion appeared     normal.   RECOMMENDATIONS:  1. Continue Prevacid 30 mg orally daily.  2.     Check Helicobacter pylori serologies today.  3. If dysphagia does not resolve then he would need further evaluation     beginning with a barium pill esophagram.  4. Patient to follow up with Dr. Sudie Bailey.  Jonathon Bellows, M.D.    RMR/MEDQ  D:  04/14/2002  T:  04/14/2002  Job:  [id number]   cc:   Mila Homer. Sudie Bailey, M.D.  [address] [place], [place] [address]  Fax: [phone]

## 2010-05-25 ENCOUNTER — Other Ambulatory Visit: Payer: No Typology Code available for payment source

## 2010-05-26 ENCOUNTER — Other Ambulatory Visit: Payer: Self-pay | Admitting: Family Medicine

## 2010-05-26 ENCOUNTER — Ambulatory Visit
Admission: RE | Admit: 2010-05-26 | Discharge: 2010-05-26 | Disposition: A | Discharge status: Home / Self Care | Payer: No Typology Code available for payment source | Source: Ambulatory Visit | Attending: Orthopaedic Surgery | Admitting: Orthopaedic Surgery

## 2010-05-26 DIAGNOSIS — M4800 Spinal stenosis, site unspecified: Secondary | ICD-10-CM

## 2010-05-26 MED ORDER — GADOBENATE DIMEGLUMINE 529 MG/ML IV SOLN
15.0000 mL | Freq: Once | INTRAVENOUS | Status: AC | PRN
  Administered 2010-05-26: 15 mL via INTRAVENOUS

## 2011-05-25 ENCOUNTER — Other Ambulatory Visit: Payer: Self-pay | Admitting: Family Medicine

## 2011-05-25 DIAGNOSIS — M79604 Pain in right leg: Secondary | ICD-10-CM

## 2011-05-26 ENCOUNTER — Ambulatory Visit
Admission: RE | Admit: 2011-05-26 | Discharge: 2011-05-26 | Disposition: A | Discharge status: Home / Self Care | Payer: PRIVATE HEALTH INSURANCE | Source: Ambulatory Visit | Attending: Family Medicine | Admitting: Family Medicine

## 2011-05-26 DIAGNOSIS — M79604 Pain in right leg: Secondary | ICD-10-CM

## 2011-10-10 ENCOUNTER — Ambulatory Visit
Admission: RE | Admit: 2011-10-10 | Discharge: 2011-10-10 | Disposition: A | Discharge status: Home / Self Care | Payer: No Typology Code available for payment source | Source: Ambulatory Visit | Attending: Family Medicine | Admitting: Family Medicine

## 2011-10-10 ENCOUNTER — Other Ambulatory Visit: Payer: Self-pay | Admitting: Family Medicine

## 2011-10-10 DIAGNOSIS — R05 Cough: Secondary | ICD-10-CM

## 2011-10-31 ENCOUNTER — Other Ambulatory Visit: Payer: Self-pay | Admitting: Family Medicine

## 2011-10-31 DIAGNOSIS — R531 Weakness: Secondary | ICD-10-CM

## 2011-11-07 ENCOUNTER — Ambulatory Visit
Admission: RE | Admit: 2011-11-07 | Discharge: 2011-11-07 | Disposition: A | Discharge status: Home / Self Care | Payer: No Typology Code available for payment source | Source: Ambulatory Visit | Attending: Family Medicine | Admitting: Family Medicine

## 2011-11-07 DIAGNOSIS — R531 Weakness: Secondary | ICD-10-CM

## 2011-11-07 MED ORDER — GADOBENATE DIMEGLUMINE 529 MG/ML IV SOLN
15.0000 mL | Freq: Once | INTRAVENOUS | Status: AC | PRN
  Administered 2011-11-07: 15 mL via INTRAVENOUS

## 2012-01-18 ENCOUNTER — Encounter (HOSPITAL_COMMUNITY): Payer: Self-pay | Admitting: *Deleted

## 2012-01-18 ENCOUNTER — Other Ambulatory Visit: Payer: Self-pay

## 2012-01-18 ENCOUNTER — Emergency Department (HOSPITAL_COMMUNITY)
Admission: EM | Admit: 2012-01-18 | Discharge: 2012-01-18 | Disposition: A | Discharge status: Home / Self Care | Payer: Medicare Other | Attending: Emergency Medicine | Admitting: Emergency Medicine

## 2012-01-18 DIAGNOSIS — F17200 Nicotine dependence, unspecified, uncomplicated: Secondary | ICD-10-CM | POA: Insufficient documentation

## 2012-01-18 DIAGNOSIS — R55 Syncope and collapse: Secondary | ICD-10-CM | POA: Insufficient documentation

## 2012-01-18 DIAGNOSIS — I10 Essential (primary) hypertension: Secondary | ICD-10-CM | POA: Insufficient documentation

## 2012-01-18 DIAGNOSIS — Z86718 Personal history of other venous thrombosis and embolism: Secondary | ICD-10-CM | POA: Insufficient documentation

## 2012-01-18 DIAGNOSIS — R42 Dizziness and giddiness: Secondary | ICD-10-CM | POA: Insufficient documentation

## 2012-01-18 HISTORY — DX: Cerebral infarction, unspecified: I63.9

## 2012-01-18 HISTORY — DX: Essential (primary) hypertension: I10

## 2012-01-18 LAB — BASIC METABOLIC PANEL
BUN: 18 mg/dL (ref 6–23)
Chloride: 103 mEq/L (ref 96–112)
Creatinine, Ser: 1.23 mg/dL (ref 0.50–1.35)
GFR calc Af Amer: 65 mL/min — ABNORMAL LOW (ref >90)
GFR calc non Af Amer: 56 mL/min — ABNORMAL LOW (ref >90)

## 2012-01-18 LAB — CBC WITH DIFFERENTIAL/PLATELET
Basophils Relative: 1 % (ref 0–1)
Eosinophils Absolute: 0.4 10*3/uL (ref 0.0–0.7)
Eosinophils Relative: 6 % — ABNORMAL HIGH (ref 0–5)
HCT: 40.1 % (ref 39.0–52.0)
Hemoglobin: 13.7 g/dL (ref 13.0–17.0)
MCH: 31.5 pg (ref 26.0–34.0)
MCHC: 34.2 g/dL (ref 30.0–36.0)
MCV: 92.2 fL (ref 78.0–100.0)
Monocytes Absolute: 0.5 10*3/uL (ref 0.1–1.0)
Monocytes Relative: 9 % (ref 3–12)

## 2012-01-18 NOTE — ED Notes (Signed)
States he almost passed out at dinner tonight, denies any pain

## 2012-01-18 NOTE — ED Provider Notes (Signed)
History     CSN: [id number]  Arrival date & time 01/18/12  1744   First MD Initiated Contact with Patient 01/18/12 1759      Chief Complaint  Patient presents with  . Near Syncope    (Consider location/radiation/quality/duration/timing/severity/associated sxs/prior treatment) HPI....Marland Kitcheneating hamburger this afternoon when he felt weak, dizzy, diaphoretic. Symptoms lasted approximately 20-30 minutes. Completely better now. No associated chest pain, dyspnea, neuro deficits, fever, sweats, chills.  He feels completely normal now. Nothing makes symptoms better or worse.  Severity was moderate. Past Medical History  Diagnosis Date  . Hypertension   . Stroke     History reviewed. No pertinent past surgical history.  No family history on file.  History  Substance Use Topics  . Smoking status: Current Every Day Smoker -- 0.5 packs/day  . Smokeless tobacco: Not on file  . Alcohol Use: No      Review of Systems  All other systems reviewed and are negative.    Allergies  Aspirin  Home Medications  No current outpatient prescriptions on file.  BP 163/68  Pulse 64  Temp 98 F (36.7 C) (Oral)  Resp 20  SpO2 97%  Physical Exam  Nursing note and vitals reviewed. Constitutional: He is oriented to person, place, and time. He appears well-developed and well-nourished.  HENT:  Head: Normocephalic and atraumatic.  Eyes: Conjunctivae normal and EOM are normal. Pupils are equal, round, and reactive to light.  Neck: Normal range of motion. Neck supple.  Cardiovascular: Normal rate, regular rhythm and normal heart sounds.   Pulmonary/Chest: Effort normal and breath sounds normal.  Abdominal: Soft. Bowel sounds are normal.  Musculoskeletal: Normal range of motion.  Neurological: He is alert and oriented to person, place, and time.  Skin: Skin is warm and dry.  Psychiatric: He has a normal mood and affect.    ED Course  Procedures (including critical care time)  Labs  Reviewed  CBC WITH DIFFERENTIAL - Abnormal; Notable for the following:    Eosinophils Relative 6 (*)     All other components within normal limits  BASIC METABOLIC PANEL - Abnormal; Notable for the following:    Glucose, Bld 120 (*)     GFR calc non Af Amer 56 (*)     GFR calc Af Amer 65 (*)     All other components within normal limits  GLUCOSE, CAPILLARY - Abnormal; Notable for the following:    Glucose-Capillary 112 (*)     All other components within normal limits   No results found.   No diagnosis found.   Date: 01/18/2012  Rate: 60  Rhythm: normal sinus rhythm  QRS Axis: normal  Intervals: normal  ST/T Wave abnormalities: normal  Conduction Disutrbances:first-degree A-V block   Narrative Interpretation:   Old EKG Reviewed: changes noted   MDM  Patient feels totally back to normal. Negative workup. His primary care followup        Donnetta Hutching, MD 01/18/12 [phone]

## 2012-02-22 ENCOUNTER — Ambulatory Visit (HOSPITAL_COMMUNITY)
Admission: RE | Admit: 2012-02-22 | Discharge: 2012-02-22 | Disposition: A | Discharge status: Home / Self Care | Payer: Medicare Other | Source: Ambulatory Visit | Attending: Family Medicine | Admitting: Family Medicine

## 2012-02-22 ENCOUNTER — Other Ambulatory Visit (HOSPITAL_COMMUNITY): Payer: Self-pay | Admitting: Family Medicine

## 2012-02-22 DIAGNOSIS — Z111 Encounter for screening for respiratory tuberculosis: Secondary | ICD-10-CM | POA: Insufficient documentation

## 2012-02-22 DIAGNOSIS — Z139 Encounter for screening, unspecified: Secondary | ICD-10-CM

## 2012-04-23 ENCOUNTER — Other Ambulatory Visit (HOSPITAL_COMMUNITY): Payer: Self-pay | Admitting: Family Medicine

## 2012-04-23 ENCOUNTER — Ambulatory Visit (HOSPITAL_COMMUNITY)
Admission: RE | Admit: 2012-04-23 | Discharge: 2012-04-23 | Disposition: A | Discharge status: Home / Self Care | Payer: PRIVATE HEALTH INSURANCE | Source: Ambulatory Visit | Attending: Family Medicine | Admitting: Family Medicine

## 2012-04-23 DIAGNOSIS — M25511 Pain in right shoulder: Secondary | ICD-10-CM

## 2012-04-23 DIAGNOSIS — M542 Cervicalgia: Secondary | ICD-10-CM | POA: Insufficient documentation

## 2012-04-23 DIAGNOSIS — M25519 Pain in unspecified shoulder: Secondary | ICD-10-CM | POA: Insufficient documentation

## 2012-04-23 DIAGNOSIS — M5030 Other cervical disc degeneration, unspecified cervical region: Secondary | ICD-10-CM | POA: Insufficient documentation

## 2012-04-23 DIAGNOSIS — M47812 Spondylosis without myelopathy or radiculopathy, cervical region: Secondary | ICD-10-CM | POA: Insufficient documentation

## 2012-04-23 DIAGNOSIS — Q762 Congenital spondylolisthesis: Secondary | ICD-10-CM | POA: Insufficient documentation

## 2012-05-23 ENCOUNTER — Other Ambulatory Visit: Payer: Self-pay | Admitting: Neurosurgery

## 2012-05-23 DIAGNOSIS — G959 Disease of spinal cord, unspecified: Secondary | ICD-10-CM

## 2012-05-29 ENCOUNTER — Ambulatory Visit: Payer: Medicare Other | Admitting: Cardiovascular Disease

## 2012-05-31 ENCOUNTER — Ambulatory Visit
Admission: RE | Admit: 2012-05-31 | Discharge: 2012-05-31 | Disposition: A | Discharge status: Home / Self Care | Payer: PRIVATE HEALTH INSURANCE | Source: Ambulatory Visit | Attending: Neurosurgery | Admitting: Neurosurgery

## 2012-05-31 DIAGNOSIS — G959 Disease of spinal cord, unspecified: Secondary | ICD-10-CM

## 2012-06-14 ENCOUNTER — Other Ambulatory Visit: Payer: Self-pay | Admitting: Neurosurgery

## 2012-06-14 DIAGNOSIS — G959 Disease of spinal cord, unspecified: Secondary | ICD-10-CM

## 2012-06-18 ENCOUNTER — Encounter: Payer: Self-pay | Admitting: Internal Medicine

## 2012-06-26 ENCOUNTER — Encounter (HOSPITAL_COMMUNITY): Payer: Self-pay | Admitting: Pharmacy Technician

## 2012-06-28 ENCOUNTER — Other Ambulatory Visit: Payer: Self-pay | Admitting: Neurosurgery

## 2012-06-28 ENCOUNTER — Encounter (HOSPITAL_COMMUNITY): Payer: Self-pay | Admitting: *Deleted

## 2012-06-28 NOTE — Progress Notes (Signed)
Pt has seen a cardiologist in the past, "she moved and he does not have one now.  PCP is Dr Katharine Look in [place]. Pt said he has a" pain sometimes in his right upper chest- that is where my neck and shoulder get to hurting."

## 2012-06-30 MED ORDER — CEFAZOLIN SODIUM-DEXTROSE 2-3 GM-% IV SOLR
2.0000 g | INTRAVENOUS | Status: AC
  Administered 2012-07-01: 2 g via INTRAVENOUS
  Filled 2012-06-30 (×2): qty 50

## 2012-07-01 ENCOUNTER — Inpatient Hospital Stay (HOSPITAL_COMMUNITY)
Admission: RE | Admit: 2012-07-01 | Discharge: 2012-07-04 | DRG: 473 | Disposition: A | Discharge status: Home Health | Payer: Medicare Other | Source: Ambulatory Visit | Attending: Neurosurgery | Admitting: Neurosurgery

## 2012-07-01 ENCOUNTER — Encounter (HOSPITAL_COMMUNITY)
Admission: RE | Disposition: A | Discharge status: Home Health | Payer: Self-pay | Source: Ambulatory Visit | Attending: Neurosurgery

## 2012-07-01 ENCOUNTER — Inpatient Hospital Stay (HOSPITAL_COMMUNITY): Payer: Medicare Other | Admitting: Anesthesiology

## 2012-07-01 ENCOUNTER — Encounter (HOSPITAL_COMMUNITY): Payer: Self-pay | Admitting: Certified Registered Nurse Anesthetist

## 2012-07-01 ENCOUNTER — Inpatient Hospital Stay (HOSPITAL_COMMUNITY): Payer: Medicare Other

## 2012-07-01 ENCOUNTER — Encounter (HOSPITAL_COMMUNITY): Payer: Self-pay | Admitting: Anesthesiology

## 2012-07-01 DIAGNOSIS — I10 Essential (primary) hypertension: Secondary | ICD-10-CM | POA: Diagnosis present

## 2012-07-01 DIAGNOSIS — Z981 Arthrodesis status: Secondary | ICD-10-CM

## 2012-07-01 DIAGNOSIS — M4802 Spinal stenosis, cervical region: Secondary | ICD-10-CM | POA: Diagnosis present

## 2012-07-01 DIAGNOSIS — Z886 Allergy status to analgesic agent status: Secondary | ICD-10-CM

## 2012-07-01 DIAGNOSIS — K219 Gastro-esophageal reflux disease without esophagitis: Secondary | ICD-10-CM | POA: Diagnosis present

## 2012-07-01 DIAGNOSIS — Z79899 Other long term (current) drug therapy: Secondary | ICD-10-CM

## 2012-07-01 DIAGNOSIS — Z7982 Long term (current) use of aspirin: Secondary | ICD-10-CM

## 2012-07-01 DIAGNOSIS — H409 Unspecified glaucoma: Secondary | ICD-10-CM | POA: Diagnosis present

## 2012-07-01 DIAGNOSIS — F17200 Nicotine dependence, unspecified, uncomplicated: Secondary | ICD-10-CM | POA: Diagnosis present

## 2012-07-01 DIAGNOSIS — M4712 Other spondylosis with myelopathy, cervical region: Principal | ICD-10-CM | POA: Diagnosis present

## 2012-07-01 DIAGNOSIS — G4730 Sleep apnea, unspecified: Secondary | ICD-10-CM | POA: Diagnosis present

## 2012-07-01 HISTORY — DX: Constipation, unspecified: K59.00

## 2012-07-01 HISTORY — DX: Unspecified glaucoma: H40.9

## 2012-07-01 HISTORY — DX: Shortness of breath: R06.02

## 2012-07-01 HISTORY — PX: ANTERIOR CERVICAL DECOMP/DISCECTOMY FUSION: SHX1161

## 2012-07-01 HISTORY — DX: Gastro-esophageal reflux disease without esophagitis: K21.9

## 2012-07-01 HISTORY — DX: Sleep apnea, unspecified: G47.30

## 2012-07-01 LAB — CBC WITH DIFFERENTIAL/PLATELET
Basophils Absolute: 0 10*3/uL (ref 0.0–0.1)
Lymphocytes Relative: 43 % (ref 12–46)
Lymphs Abs: 3 10*3/uL (ref 0.7–4.0)
Neutro Abs: 3.1 10*3/uL (ref 1.7–7.7)
Neutrophils Relative %: 45 % (ref 43–77)
Platelets: 219 10*3/uL (ref 150–400)
RBC: 4.73 MIL/uL (ref 4.22–5.81)
RDW: 13.2 % (ref 11.5–15.5)
WBC: 7 10*3/uL (ref 4.0–10.5)

## 2012-07-01 LAB — BASIC METABOLIC PANEL
Calcium: 9.2 mg/dL (ref 8.4–10.5)
Chloride: 101 mEq/L (ref 96–112)
Creatinine, Ser: 1.13 mg/dL (ref 0.50–1.35)
GFR calc Af Amer: 71 mL/min — ABNORMAL LOW (ref >90)
Sodium: 139 mEq/L (ref 135–145)

## 2012-07-01 LAB — SURGICAL PCR SCREEN
MRSA, PCR: NEGATIVE
Staphylococcus aureus: NEGATIVE

## 2012-07-01 SURGERY — ANTERIOR CERVICAL DECOMPRESSION/DISCECTOMY FUSION 1 LEVEL/HARDWARE REMOVAL
Anesthesia: General | Wound class: Clean

## 2012-07-01 MED ORDER — SODIUM CHLORIDE 0.9 % IJ SOLN: 3.0000 mL | INTRAMUSCULAR | Status: DC | PRN

## 2012-07-01 MED ORDER — DEXAMETHASONE SODIUM PHOSPHATE 10 MG/ML IJ SOLN
10.0000 mg | INTRAMUSCULAR | Status: AC
  Administered 2012-07-01: 10 mg via INTRAVENOUS

## 2012-07-01 MED ORDER — 0.9 % SODIUM CHLORIDE (POUR BTL) OPTIME
TOPICAL | Status: DC | PRN
  Administered 2012-07-01: 1000 mL

## 2012-07-01 MED ORDER — EPHEDRINE SULFATE 50 MG/ML IJ SOLN
INTRAMUSCULAR | Status: DC | PRN
  Administered 2012-07-01: 2.5 mg via INTRAVENOUS
  Administered 2012-07-01 (×2): 5 mg via INTRAVENOUS

## 2012-07-01 MED ORDER — MENTHOL 3 MG MT LOZG
1.0000 | LOZENGE | OROMUCOSAL | Status: DC | PRN
  Administered 2012-07-01: 3 mg via ORAL
  Filled 2012-07-01: qty 9

## 2012-07-01 MED ORDER — SODIUM CHLORIDE 0.9 % IV SOLN: 250.0000 mL | INTRAVENOUS | Status: DC

## 2012-07-01 MED ORDER — CEFAZOLIN SODIUM-DEXTROSE 2-3 GM-% IV SOLR: 2.0000 g | INTRAVENOUS | Status: DC

## 2012-07-01 MED ORDER — OXYCODONE HCL 5 MG PO TABS: 5.0000 mg | ORAL_TABLET | Freq: Once | ORAL | Status: DC | PRN

## 2012-07-01 MED ORDER — ARTIFICIAL TEARS OP OINT
TOPICAL_OINTMENT | OPHTHALMIC | Status: DC | PRN
  Administered 2012-07-01: 1 via OPHTHALMIC

## 2012-07-01 MED ORDER — LISINOPRIL 10 MG PO TABS
10.0000 mg | ORAL_TABLET | Freq: Every day | ORAL | Status: DC
  Administered 2012-07-01 – 2012-07-04 (×4): 10 mg via ORAL
  Filled 2012-07-01 (×4): qty 1

## 2012-07-01 MED ORDER — SENNA 8.6 MG PO TABS
1.0000 | ORAL_TABLET | Freq: Two times a day (BID) | ORAL | Status: DC
  Administered 2012-07-01 – 2012-07-04 (×5): 8.6 mg via ORAL
  Filled 2012-07-01 (×7): qty 1

## 2012-07-01 MED ORDER — LACTATED RINGERS IV SOLN
INTRAVENOUS | Status: DC | PRN
  Administered 2012-07-01 (×2): via INTRAVENOUS

## 2012-07-01 MED ORDER — SODIUM CHLORIDE 0.9 % IR SOLN
Status: DC | PRN
  Administered 2012-07-01: 10:00:00

## 2012-07-01 MED ORDER — BISACODYL 10 MG RE SUPP: 10.0000 mg | Freq: Every day | RECTAL | Status: DC | PRN

## 2012-07-01 MED ORDER — SODIUM CHLORIDE 0.9 % IJ SOLN
3.0000 mL | Freq: Two times a day (BID) | INTRAMUSCULAR | Status: DC
  Administered 2012-07-01 – 2012-07-03 (×3): 3 mL via INTRAVENOUS

## 2012-07-01 MED ORDER — ADULT MULTIVITAMIN W/MINERALS CH
1.0000 | ORAL_TABLET | Freq: Every day | ORAL | Status: DC
  Administered 2012-07-01 – 2012-07-04 (×4): 1 via ORAL
  Filled 2012-07-01 (×4): qty 1

## 2012-07-01 MED ORDER — HYDROCHLOROTHIAZIDE 25 MG PO TABS
25.0000 mg | ORAL_TABLET | Freq: Every day | ORAL | Status: DC
  Administered 2012-07-01 – 2012-07-04 (×4): 25 mg via ORAL
  Filled 2012-07-01 (×4): qty 1

## 2012-07-01 MED ORDER — VITAMIN D3 25 MCG (1000 UNIT) PO TABS
1000.0000 [IU] | ORAL_TABLET | Freq: Every day | ORAL | Status: DC
  Administered 2012-07-01 – 2012-07-04 (×4): 1000 [IU] via ORAL
  Filled 2012-07-01 (×4): qty 1

## 2012-07-01 MED ORDER — NEOSTIGMINE METHYLSULFATE 1 MG/ML IJ SOLN
INTRAMUSCULAR | Status: DC | PRN
  Administered 2012-07-01: 4 mg via INTRAVENOUS

## 2012-07-01 MED ORDER — POLYETHYLENE GLYCOL 3350 17 G PO PACK
17.0000 g | PACK | Freq: Every day | ORAL | Status: DC | PRN
  Filled 2012-07-01: qty 1

## 2012-07-01 MED ORDER — OXYCODONE HCL 5 MG/5ML PO SOLN: 5.0000 mg | Freq: Once | ORAL | Status: DC | PRN

## 2012-07-01 MED ORDER — PANTOPRAZOLE SODIUM 40 MG PO TBEC
80.0000 mg | DELAYED_RELEASE_TABLET | Freq: Every day | ORAL | Status: DC
  Administered 2012-07-01 – 2012-07-04 (×4): 80 mg via ORAL
  Filled 2012-07-01: qty 1
  Filled 2012-07-01 (×4): qty 2

## 2012-07-01 MED ORDER — ASPIRIN EC 81 MG PO TBEC
81.0000 mg | DELAYED_RELEASE_TABLET | Freq: Every day | ORAL | Status: DC
  Administered 2012-07-01 – 2012-07-04 (×3): 81 mg via ORAL
  Filled 2012-07-01 (×4): qty 1

## 2012-07-01 MED ORDER — HYDROCODONE-ACETAMINOPHEN 5-325 MG PO TABS
1.0000 | ORAL_TABLET | ORAL | Status: DC | PRN
  Administered 2012-07-01: 1 via ORAL
  Filled 2012-07-01: qty 1

## 2012-07-01 MED ORDER — PHENYLEPHRINE HCL 10 MG/ML IJ SOLN
INTRAMUSCULAR | Status: DC | PRN
  Administered 2012-07-01: 80 ug via INTRAVENOUS
  Administered 2012-07-01 (×2): 40 ug via INTRAVENOUS
  Administered 2012-07-01 (×3): 80 ug via INTRAVENOUS
  Administered 2012-07-01: 40 ug via INTRAVENOUS
  Administered 2012-07-01 (×2): 80 ug via INTRAVENOUS
  Administered 2012-07-01: 40 ug via INTRAVENOUS

## 2012-07-01 MED ORDER — OXYCODONE-ACETAMINOPHEN 5-325 MG PO TABS
1.0000 | ORAL_TABLET | ORAL | Status: DC | PRN
  Administered 2012-07-01: 1 via ORAL
  Administered 2012-07-02 – 2012-07-03 (×2): 2 via ORAL
  Filled 2012-07-01: qty 1
  Filled 2012-07-01 (×2): qty 2

## 2012-07-01 MED ORDER — ALUM & MAG HYDROXIDE-SIMETH 200-200-20 MG/5ML PO SUSP: 30.0000 mL | Freq: Four times a day (QID) | ORAL | Status: DC | PRN

## 2012-07-01 MED ORDER — DEXAMETHASONE SODIUM PHOSPHATE 10 MG/ML IJ SOLN
INTRAMUSCULAR | Status: AC
  Filled 2012-07-01: qty 1

## 2012-07-01 MED ORDER — ACETAMINOPHEN 650 MG RE SUPP: 650.0000 mg | RECTAL | Status: DC | PRN

## 2012-07-01 MED ORDER — SODIUM CHLORIDE 0.9 % IV SOLN
INTRAVENOUS | Status: AC
  Filled 2012-07-01: qty 500

## 2012-07-01 MED ORDER — FENTANYL CITRATE 0.05 MG/ML IJ SOLN
INTRAMUSCULAR | Status: DC | PRN
  Administered 2012-07-01 (×2): 100 ug via INTRAVENOUS
  Administered 2012-07-01: 50 ug via INTRAVENOUS

## 2012-07-01 MED ORDER — ACETAMINOPHEN 325 MG PO TABS: 650.0000 mg | ORAL_TABLET | ORAL | Status: DC | PRN

## 2012-07-01 MED ORDER — LIDOCAINE HCL (CARDIAC) 20 MG/ML IV SOLN
INTRAVENOUS | Status: DC | PRN
  Administered 2012-07-01: 100 mg via INTRAVENOUS

## 2012-07-01 MED ORDER — PHENOL 1.4 % MT LIQD
1.0000 | OROMUCOSAL | Status: DC | PRN
  Filled 2012-07-01: qty 177

## 2012-07-01 MED ORDER — CEFAZOLIN SODIUM 1-5 GM-% IV SOLN
1.0000 g | Freq: Three times a day (TID) | INTRAVENOUS | Status: AC
  Administered 2012-07-01 – 2012-07-02 (×2): 1 g via INTRAVENOUS
  Filled 2012-07-01 (×2): qty 50

## 2012-07-01 MED ORDER — MIDAZOLAM HCL 2 MG/2ML IJ SOLN: 1.0000 mg | INTRAMUSCULAR | Status: DC | PRN

## 2012-07-01 MED ORDER — ONDANSETRON HCL 4 MG/2ML IJ SOLN: 4.0000 mg | INTRAMUSCULAR | Status: DC | PRN

## 2012-07-01 MED ORDER — BACITRACIN 50000 UNITS IM SOLR
INTRAMUSCULAR | Status: AC
  Filled 2012-07-01: qty 1

## 2012-07-01 MED ORDER — HYDROMORPHONE HCL PF 1 MG/ML IJ SOLN: 0.5000 mg | INTRAMUSCULAR | Status: DC | PRN

## 2012-07-01 MED ORDER — HEMOSTATIC AGENTS (NO CHARGE) OPTIME
TOPICAL | Status: DC | PRN
  Administered 2012-07-01: 1 via TOPICAL

## 2012-07-01 MED ORDER — CYCLOBENZAPRINE HCL 10 MG PO TABS: 10.0000 mg | ORAL_TABLET | Freq: Three times a day (TID) | ORAL | Status: DC | PRN

## 2012-07-01 MED ORDER — ONDANSETRON HCL 4 MG/2ML IJ SOLN
INTRAMUSCULAR | Status: DC | PRN
  Administered 2012-07-01: 4 mg via INTRAVENOUS

## 2012-07-01 MED ORDER — GLYCOPYRROLATE 0.2 MG/ML IJ SOLN
INTRAMUSCULAR | Status: DC | PRN
  Administered 2012-07-01: 0.6 mg via INTRAVENOUS

## 2012-07-01 MED ORDER — DARIFENACIN HYDROBROMIDE ER 7.5 MG PO TB24
7.5000 mg | ORAL_TABLET | Freq: Every day | ORAL | Status: DC
  Administered 2012-07-01 – 2012-07-04 (×4): 7.5 mg via ORAL
  Filled 2012-07-01 (×4): qty 1

## 2012-07-01 MED ORDER — MUPIROCIN 2 % EX OINT
TOPICAL_OINTMENT | CUTANEOUS | Status: AC
  Administered 2012-07-01: 1 via NASAL
  Filled 2012-07-01: qty 22

## 2012-07-01 MED ORDER — THROMBIN 5000 UNITS EX SOLR
CUTANEOUS | Status: DC | PRN
  Administered 2012-07-01 (×2): 5000 [IU] via TOPICAL

## 2012-07-01 MED ORDER — HYDROMORPHONE HCL PF 1 MG/ML IJ SOLN
INTRAMUSCULAR | Status: AC
  Filled 2012-07-01: qty 1

## 2012-07-01 MED ORDER — PROPOFOL 10 MG/ML IV BOLUS
INTRAVENOUS | Status: DC | PRN
  Administered 2012-07-01: 180 mg via INTRAVENOUS

## 2012-07-01 MED ORDER — FLEET ENEMA 7-19 GM/118ML RE ENEM: 1.0000 | ENEMA | Freq: Once | RECTAL | Status: AC | PRN

## 2012-07-01 MED ORDER — NITROGLYCERIN 0.4 MG SL SUBL: 0.4000 mg | SUBLINGUAL_TABLET | SUBLINGUAL | Status: DC | PRN

## 2012-07-01 MED ORDER — ROCURONIUM BROMIDE 100 MG/10ML IV SOLN
INTRAVENOUS | Status: DC | PRN
  Administered 2012-07-01: 50 mg via INTRAVENOUS

## 2012-07-01 MED ORDER — METOCLOPRAMIDE HCL 10 MG PO TABS
10.0000 mg | ORAL_TABLET | Freq: Every day | ORAL | Status: DC
Start: 2012-07-01 — End: 2012-07-04
  Administered 2012-07-01 – 2012-07-03 (×3): 10 mg via ORAL
  Filled 2012-07-01 (×4): qty 1

## 2012-07-01 MED ORDER — MIDAZOLAM HCL 5 MG/5ML IJ SOLN
INTRAMUSCULAR | Status: DC | PRN
  Administered 2012-07-01: 1 mg via INTRAVENOUS

## 2012-07-01 MED ORDER — POTASSIUM CHLORIDE CRYS ER 20 MEQ PO TBCR
20.0000 meq | EXTENDED_RELEASE_TABLET | Freq: Two times a day (BID) | ORAL | Status: DC
  Administered 2012-07-01 – 2012-07-04 (×7): 20 meq via ORAL
  Filled 2012-07-01 (×8): qty 1

## 2012-07-01 MED ORDER — FENTANYL CITRATE 0.05 MG/ML IJ SOLN: 50.0000 ug | Freq: Once | INTRAMUSCULAR | Status: DC

## 2012-07-01 MED ORDER — HYDROMORPHONE HCL PF 1 MG/ML IJ SOLN
0.2500 mg | INTRAMUSCULAR | Status: DC | PRN
  Administered 2012-07-01 (×2): 0.5 mg via INTRAVENOUS

## 2012-07-01 SURGICAL SUPPLY — 57 items
BAG DECANTER FOR FLEXI CONT (MISCELLANEOUS) ×2 IMPLANT
BENZOIN TINCTURE PRP APPL 2/3 (GAUZE/BANDAGES/DRESSINGS) ×2 IMPLANT
BIT DRILL NEURO 2X3.1 SFT TUCH (MISCELLANEOUS) ×1 IMPLANT
BRUSH SCRUB EZ PLAIN DRY (MISCELLANEOUS) ×2 IMPLANT
BUR MATCHSTICK NEURO 3.0 LAGG (BURR) ×2 IMPLANT
CANISTER SUCTION 2500CC (MISCELLANEOUS) ×2 IMPLANT
CLOTH BEACON ORANGE TIMEOUT ST (SAFETY) ×2 IMPLANT
CONT SPEC 4OZ CLIKSEAL STRL BL (MISCELLANEOUS) ×2 IMPLANT
DERMABOND ADVANCED (GAUZE/BANDAGES/DRESSINGS) ×1
DERMABOND ADVANCED .7 DNX12 (GAUZE/BANDAGES/DRESSINGS) ×1 IMPLANT
DRAPE C-ARM 42X72 X-RAY (DRAPES) ×4 IMPLANT
DRAPE LAPAROTOMY 100X72 PEDS (DRAPES) ×2 IMPLANT
DRAPE MICROSCOPE ZEISS OPMI (DRAPES) ×2 IMPLANT
DRAPE POUCH INSTRU U-SHP 10X18 (DRAPES) ×2 IMPLANT
DRILL BIT (BIT) ×2 IMPLANT
DRILL NEURO 2X3.1 SOFT TOUCH (MISCELLANEOUS) ×2
DURAPREP 6ML APPLICATOR 50/CS (WOUND CARE) ×2 IMPLANT
ELECT COATED BLADE 2.86 ST (ELECTRODE) ×2 IMPLANT
ELECT REM PT RETURN 9FT ADLT (ELECTROSURGICAL) ×2
ELECTRODE REM PT RTRN 9FT ADLT (ELECTROSURGICAL) ×1 IMPLANT
GAUZE SPONGE 4X4 16PLY XRAY LF (GAUZE/BANDAGES/DRESSINGS) IMPLANT
GLOVE BIO SURGEON STRL SZ8.5 (GLOVE) ×2 IMPLANT
GLOVE ECLIPSE 8.5 STRL (GLOVE) ×2 IMPLANT
GLOVE EXAM NITRILE LRG STRL (GLOVE) ×2 IMPLANT
GLOVE EXAM NITRILE MD LF STRL (GLOVE) IMPLANT
GLOVE EXAM NITRILE XL STR (GLOVE) IMPLANT
GLOVE EXAM NITRILE XS STR PU (GLOVE) IMPLANT
GLOVE INDICATOR 7.0 STRL GRN (GLOVE) ×2 IMPLANT
GLOVE OPTIFIT SS 6.5 STRL BRWN (GLOVE) ×4 IMPLANT
GLOVE OPTIFIT SS 8.0 STRL (GLOVE) ×2 IMPLANT
GOWN BRE IMP SLV AUR LG STRL (GOWN DISPOSABLE) ×2 IMPLANT
GOWN BRE IMP SLV AUR XL STRL (GOWN DISPOSABLE) ×4 IMPLANT
GOWN STRL REIN 2XL LVL4 (GOWN DISPOSABLE) IMPLANT
HEAD HALTER (SOFTGOODS) ×2 IMPLANT
KIT BASIN OR (CUSTOM PROCEDURE TRAY) ×2 IMPLANT
KIT ROOM TURNOVER OR (KITS) ×2 IMPLANT
NEEDLE SPNL 20GX3.5 QUINCKE YW (NEEDLE) ×2 IMPLANT
NS IRRIG 1000ML POUR BTL (IV SOLUTION) ×2 IMPLANT
PACK LAMINECTOMY NEURO (CUSTOM PROCEDURE TRAY) ×2 IMPLANT
PAD ARMBOARD 7.5X6 YLW CONV (MISCELLANEOUS) ×6 IMPLANT
PLATE VISION ELITE 27.5MM (Plate) ×2 IMPLANT
RUBBERBAND STERILE (MISCELLANEOUS) ×4 IMPLANT
SCREW ST 13X4XST VA NS SPNE (Screw) ×4 IMPLANT
SCREW ST VAR 4 ATL (Screw) ×4 IMPLANT
SPACER BONE CORNERSTONE 8X14 (Orthopedic Implant) ×4 IMPLANT
SPONGE GAUZE 4X4 12PLY (GAUZE/BANDAGES/DRESSINGS) ×2 IMPLANT
SPONGE INTESTINAL PEANUT (DISPOSABLE) ×2 IMPLANT
SPONGE SURGIFOAM ABS GEL SZ50 (HEMOSTASIS) ×2 IMPLANT
STRIP CLOSURE SKIN 1/2X4 (GAUZE/BANDAGES/DRESSINGS) ×2 IMPLANT
SUT PDS AB 5-0 P3 18 (SUTURE) ×2 IMPLANT
SUT VIC AB 3-0 SH 8-18 (SUTURE) ×2 IMPLANT
SYR 20ML ECCENTRIC (SYRINGE) ×2 IMPLANT
TAPE CLOTH 4X10 WHT NS (GAUZE/BANDAGES/DRESSINGS) ×2 IMPLANT
TAPE CLOTH SURG 4X10 WHT LF (GAUZE/BANDAGES/DRESSINGS) ×2 IMPLANT
TOWEL OR 17X24 6PK STRL BLUE (TOWEL DISPOSABLE) ×2 IMPLANT
TOWEL OR 17X26 10 PK STRL BLUE (TOWEL DISPOSABLE) ×2 IMPLANT
WATER STERILE IRR 1000ML POUR (IV SOLUTION) ×2 IMPLANT

## 2012-07-01 NOTE — Brief Op Note (Signed)
07/01/2012  1:09 PM  PATIENT:  Kirke Corin  78 y.o. male  PRE-OPERATIVE DIAGNOSIS:  stenosis  POST-OPERATIVE DIAGNOSIS:  stenosis  PROCEDURE:  Procedure(s): ANTERIOR CERVICAL DECOMPRESSION/DISCECTOMY FUSION CERVICAL FIVE-SIX [name] REMOVAL CERVICAL SIX-SEVEN (N/A)  SURGEON:  Surgeon(s) and Role:    * Temple Pacini, MD - Primary  PHYSICIAN ASSISTANT:   ASSISTANTS: Jenkins   ANESTHESIA:   general  EBL:  Total I/O In: 1900 [I.V.:1900] Out: 50 [Blood:50]  BLOOD ADMINISTERED:none  DRAINS: none   LOCAL MEDICATIONS USED:  NONE  SPECIMEN:  No Specimen  DISPOSITION OF SPECIMEN:  N/A  COUNTS:  YES  TOURNIQUET:  * No tourniquets in log *  DICTATION: .Dragon Dictation  PLAN OF CARE: Admit to inpatient   PATIENT DISPOSITION:  PACU - hemodynamically stable.   Delay start of Pharmacological VTE agent (>24hrs) due to surgical blood loss or risk of bleeding: yes

## 2012-07-01 NOTE — Anesthesia Preprocedure Evaluation (Signed)
Anesthesia Evaluation  Patient identified by MRN, date of birth, ID band Patient awake    Reviewed: Allergy & Precautions, H&P , NPO status , Patient's Chart, lab work & pertinent test results  Airway Mallampati: II TM Distance: >3 FB Neck ROM: Limited    Dental   Pulmonary shortness of breath, sleep apnea , COPDCurrent Smoker,  + rhonchi         Cardiovascular hypertension, + CABG and + Peripheral Vascular Disease Rhythm:Regular Rate:Normal     Neuro/Psych CVA    GI/Hepatic GERD-  ,  Endo/Other    Renal/GU      Musculoskeletal   Abdominal   Peds  Hematology   Anesthesia Other Findings   Reproductive/Obstetrics                           Anesthesia Physical Anesthesia Plan  ASA: III  Anesthesia Plan: General   Post-op Pain Management:    Induction: Intravenous  Airway Management Planned: Oral ETT  Additional Equipment:   Intra-op Plan:   Post-operative Plan: Extubation in OR  Informed Consent: I have reviewed the patients History and Physical, chart, labs and discussed the procedure including the risks, benefits and alternatives for the proposed anesthesia with the patient or authorized representative who has indicated his/her understanding and acceptance.     Plan Discussed with: CRNA and Surgeon  Anesthesia Plan Comments:         Anesthesia Quick Evaluation

## 2012-07-01 NOTE — Transfer of Care (Signed)
Immediate Anesthesia Transfer of Care Note  Patient: [name]  Procedure(s) Performed: Procedure(s): ANTERIOR CERVICAL DECOMPRESSION/DISCECTOMY FUSION CERVICAL FIVE-SIX [name] REMOVAL CERVICAL SIX-SEVEN (N/A)  Patient Location: PACU  Anesthesia Type:General  Level of Consciousness: Alert and oriented, cooperative.  Airway & Oxygen Therapy: Patient Spontanous Breathing  Post-op Assessment: Report given to PACU RN, Post -op Vital signs reviewed and stable.  MAE  Post vital signs: Reviewed and stable  Complications: No apparent anesthesia complications

## 2012-07-01 NOTE — Preoperative (Signed)
Beta Blockers   Reason not to administer Beta Blockers:Not Applicable 

## 2012-07-01 NOTE — Op Note (Signed)
Date of procedure: 07/01/2012  Date of dictation: Same  Service: Neurosurgery  Preoperative diagnosis: C5-6 stenosis with myelopathy status post previous C6-7 anterior cervical discectomy and fusion with allograft and her plating.  Postoperative diagnosis: Same  Procedure Name: C5-6 anterior cervical discectomy with interbody allograft fusion and anterior plate instrumentation.  Reexploration of C6-7 anterior cervical fusion with removal of C6-7 anterior cervical plate hardware.  Surgeon:Bailee Thall A.Zair Borawski, M.D.  Asst. Surgeon: Lovell Sheehan  Anesthesia: General  Indication: 78 year old male status post previous C6-7 anterior cervical discectomy and fusion with allograft and her plating for treatment of a severe compressive myelopathy. Postoperative patient did reasonably well. Good functional gains over the last 6 months has worsened once again. Workup demonstrates evidence of stenosis above the level was previous fusion with significant spinal cord compression. Patient presents now for C5-6 anterior cervical decompression infusion in hopes of improving his symptoms.  Operative note: After induction of anesthesia, patient positioned supine with Extended and held in place of halter traction. Patient's anterior cervical region was prepped and draped sterilely. 10 blade in a linear skin incision overlying the C6 vertebral level. This carried down sharply through the platysma. Is then divided vertically and dissection proceeded on the medial border of the sternocleidomastoid muscle and carotid sheath. Trachea and esophagus were mobilized and retracted towards the left. Prevertebral fascia stripped off the anterior spinal column. Longus colli muscles elevated bilaterally. Previous anterior plate instrumentation at C6-7 was dissected free disassembled and removed. Fusion at C6-7 was inspected and found to be solid. Disc space at C5-6 was then incised 15 blade in a rectangular fashion. A large anterior  osteophyte emanating from the C5 vertebral body was resected. An aggressive discectomies and performed using pituitary rongeurs forward and backward angle Carlen curettes Kerrison rongeurs the high-speed drill. All elements the disc removed down to the level of the posterior annulus. Maximum then brought into the field and used throughout the remainder of the discectomy. Remaining aspects of annulus and osteophytes removed using a high-speed drill down to the level of the posterior longitudinal limb. Posterior lateral and was elevated and resected piecemeal fashion using Kerrison rongeurs. Underlying thecal sac was identified. A wide central decompression and perform undercutting the bodies of C5 and C6. Decompression MCH are of foramen. Wide anterior foraminotomies were then performed on course exiting C6 nerve roots bilaterally. This point a very thorough decompression been achieved. There is no evidence of injury to thecal sac and nerve roots. Wounds an area and with and bike solution. Gelfoam was placed topically for hemostasis the needed. Microscope and retractor system were removed. An 8 mm cornerstone allograft wedges and packed in place recessed off the 1 mm from the anterior cortical margin. 27 mm Atlantis anterior splint was then placed over the C5 and C6 vertebral level. This is an attachment or fluoroscopic guidance using 13 mm variable-angle screws 2 each at both levels. All 4 screws were given a final tightening down to be solidly within bone. Locking screws were engaged at both levels. Final images revealed good position the bone graft and hardware at proper upper level with normal lamina spine. Wounds that area and one final time. Hemostasis was achieved with bipolar chart was and close in layers with Vicryl sutures. Steri-Strips and sterile dressing were applied. There were no apparent complications. Patient tolerated the procedure well and he returns recovery room postop.

## 2012-07-01 NOTE — H&P (Signed)
[name] is an 78 y.o. male.   Chief Complaint: Neck pain and weakness. HPI: 78 year old male status post C6-7 anterior cervical discectomy and fusion for treatment of a severe compressive myelopathy. Patient did reasonably well but over the past 8 months has had recurrent neck pain and upper extremity pain. Patient also with increasing difficulty with lower extremity weakness. He is essentially become nonambulatory. Past Medical History  Diagnosis Date  . Hypertension   . Stroke     Left side weakness.  . Shortness of breath     with activity  . Sleep apnea     does not use CPAP.  Tested maybe 5- 10 years ago  . GERD (gastroesophageal reflux disease)   . Constipation   . Glaucoma     Past Surgical History  Procedure Laterality Date  . Hernia repair Right     inguinal  . Cervical fusion      History reviewed. No pertinent family history. Social History:  reports that he has been smoking.  He does not have any smokeless tobacco history on file. He reports that he does not drink alcohol or use illicit drugs.  Allergies:  Allergies  Allergen Reactions  . Aspirin     REACTION: vomiting, tolerates 81mg  daily    Medications Prior to Admission  Medication Sig Dispense Refill  . aspirin EC 81 MG tablet Take 81 mg by mouth daily.      . cholecalciferol (VITAMIN D) 1000 UNITS tablet Take 1,000 Units by mouth daily.      . fluticasone (FLONASE) 50 MCG/ACT nasal spray Place 2 sprays into the nose daily as needed for rhinitis.      . hydrochlorothiazide (HYDRODIURIL) 25 MG tablet Take 25 mg by mouth daily.      [name] ibuprofen (ADVIL,MOTRIN) 200 MG tablet Take 400 mg by mouth 2 (two) times daily as needed for pain.      [name] lisinopril (PRINIVIL,ZESTRIL) 10 MG tablet Take 10 mg by mouth daily.      . metoCLOPramide (REGLAN) 10 MG tablet Take 10 mg by mouth at bedtime.      . Multiple Vitamin (MULTIVITAMIN WITH MINERALS) TABS Take 1 tablet by mouth daily.      . nitroGLYCERIN (NITROSTAT)  0.4 MG SL tablet Place 0.4 mg under the tongue every 5 (five) minutes as needed for chest pain.      [name] omeprazole (PRILOSEC) 20 MG capsule Take 20 mg by mouth daily.      . polyethylene glycol (MIRALAX / GLYCOLAX) packet Take 17 g by mouth as needed.      . potassium chloride SA (K-DUR,KLOR-CON) 20 MEQ tablet Take 20 mEq by mouth 2 (two) times daily.      . solifenacin (VESICARE) 5 MG tablet Take 5 mg by mouth daily.        Results for orders placed during the hospital encounter of 07/01/12 (from the past 48 hour(s))  BASIC METABOLIC PANEL     Status: Abnormal   Collection Time    07/01/12  9:28 AM      Result Value Range   Sodium 139  135 - 145 mEq/L   Potassium 3.6  3.5 - 5.1 mEq/L   Chloride 101  96 - 112 mEq/L   CO2 26  19 - 32 mEq/L   Glucose, Bld 103 (*) 70 - 99 mg/dL   BUN 19  6 - 23 mg/dL   Creatinine, Ser 0.98  0.50 - 1.35 mg/dL   Calcium  9.2  8.4 - 10.5 mg/dL   GFR calc non Af Amer 61 (*) >90 mL/min   GFR calc Af Amer 71 (*) >90 mL/min   Comment:            The eGFR has been calculated     using the CKD EPI equation.     This calculation has not been     validated in all clinical     situations.     eGFR's persistently     <90 mL/min signify     possible Chronic Kidney Disease.  CBC WITH DIFFERENTIAL     Status: None   Collection Time    07/01/12  9:28 AM      Result Value Range   WBC 7.0  4.0 - 10.5 K/uL   RBC 4.73  4.22 - 5.81 MIL/uL   Hemoglobin 14.5  13.0 - 17.0 g/dL   HCT 16.1  09.6 - 04.5 %   MCV 90.5  78.0 - 100.0 fL   MCH 30.7  26.0 - 34.0 pg   MCHC 33.9  30.0 - 36.0 g/dL   RDW 40.9  81.1 - 91.4 %   Platelets 219  150 - 400 K/uL   Neutrophils Relative % 45  43 - 77 %   Neutro Abs 3.1  1.7 - 7.7 K/uL   Lymphocytes Relative 43  12 - 46 %   Lymphs Abs 3.0  0.7 - 4.0 K/uL   Monocytes Relative 7  3 - 12 %   Monocytes Absolute 0.5  0.1 - 1.0 K/uL   Eosinophils Relative 5  0 - 5 %   Eosinophils Absolute 0.3  0.0 - 0.7 K/uL   Basophils Relative 1  0 - 1 %    Basophils Absolute 0.0  0.0 - 0.1 K/uL   No results found.  Review of Systems  Constitutional: Negative.   HENT: Negative.   Eyes: Negative.   Respiratory: Negative.   Cardiovascular: Negative.   Gastrointestinal: Negative.   Genitourinary: Negative.   Musculoskeletal: Negative.   Skin: Negative.   Neurological: Negative.   Endo/Heme/Allergies: Negative.   Psychiatric/Behavioral: Negative.     Blood pressure 145/82, pulse 77, temperature 97.1 F (36.2 C), temperature source Oral, resp. rate 18, height 5\' 9"  (1.753 m), weight 76.5 kg (168 lb 10.4 oz), SpO2 99.00%. Physical Exam  Constitutional: He is oriented to person, place, and time. He appears well-developed and well-nourished. No distress.  HENT:  Head: Normocephalic and atraumatic.  Right Ear: External ear normal.  Left Ear: External ear normal.  Nose: Nose normal.  Mouth/Throat: Oropharynx is clear and moist.  Eyes: Conjunctivae and EOM are normal. Pupils are equal, round, and reactive to light. Right eye exhibits no discharge. Left eye exhibits no discharge.  Neck: Normal range of motion. No tracheal deviation present. No thyromegaly present.  Cardiovascular: Normal rate, regular rhythm, normal heart sounds and intact distal pulses.  Exam reveals no friction rub.   No murmur heard. Respiratory: Effort normal and breath sounds normal. No respiratory distress. He has no wheezes.  GI: Soft. Bowel sounds are normal. He exhibits no distension. There is no tenderness.  Musculoskeletal: Normal range of motion. He exhibits no edema and no tenderness.  Neurological: He is alert and oriented to person, place, and time. No cranial nerve deficit. He exhibits normal muscle tone. Coordination normal.  Patient with bilateral distal upper extremity weakness involving grip strength and intrinsic function bilaterally. Patient with bilateral upper extremity sensory loss. Bilateral lower  extremity weakness grating out at 4+ over 5. Patient  minimally ambulatory. Mild hyperreflexia.  Skin: Skin is warm and dry. No rash noted. He is not diaphoretic. No erythema. No pallor.  Psychiatric: He has a normal mood and affect. His behavior is normal. Judgment and thought content normal.     Assessment/Plan C5-6 stenosis with myelopathy status post previous C6-7 decompression and fusion. Plan C5-6 anterior cervical discectomy and fusion with allograft and her plating. Risks and benefits have been explained. Patient wishes to proceed.  Collette Pescador A 07/01/2012, 10:55 AM

## 2012-07-01 NOTE — Progress Notes (Signed)
UR COMPLETED  

## 2012-07-01 NOTE — Anesthesia Postprocedure Evaluation (Signed)
  Anesthesia Post-op Note  Patient: [name]  Procedure(s) Performed: Procedure(s): ANTERIOR CERVICAL DECOMPRESSION/DISCECTOMY FUSION CERVICAL FIVE-SIX [name] REMOVAL CERVICAL SIX-SEVEN (N/A)  Patient Location: PACU  Anesthesia Type:General  Level of Consciousness: awake  Airway and Oxygen Therapy: Patient Spontanous Breathing  Post-op Pain: mild  Post-op Assessment: Post-op Vital signs reviewed, Patient's Cardiovascular Status Stable, Respiratory Function Stable, Patent Airway, No signs of Nausea or vomiting and Pain level controlled  Post-op Vital Signs: stable  Complications: No apparent anesthesia complications

## 2012-07-01 NOTE — Plan of Care (Signed)
Offered pain meds but pt refused. States pain is 0/10.

## 2012-07-02 ENCOUNTER — Inpatient Hospital Stay (HOSPITAL_COMMUNITY): Admission: RE | Admit: 2012-07-02 | Payer: PRIVATE HEALTH INSURANCE | Source: Ambulatory Visit

## 2012-07-02 NOTE — Progress Notes (Signed)
Brief Nutrition Note:   RD pulled to chart for malnutrition screening tool report of possible unintentional weight loss.   Pt sleeping at time of RD visit. Spoke with family in room. Pt has not had a change in appetite. Pt has had some weight loss, but it was intentional with changes in diet/intake.   Chart reviewed, no nutrition interventions warranted at this time. Please consult as needed.   Clarene Duke RD, LDN Pager [phone] After Hours pager [phone]

## 2012-07-02 NOTE — Evaluation (Signed)
Occupational Therapy Evaluation Patient Details Name: [name] MRN: [id number] DOB: 08/25/1934 Today's Date: 07/02/2012 Time: 8469-6295 OT Time Calculation (min): 22 min  OT Assessment / Plan / Recommendation History of present illness 78 year old male with h/o CVA and left sided weakness status post previous C6-7 anterior cervical discectomy and fusion with allograft and her plating for treatment of a severe compressive myelopathy. Postoperative patient did reasonably well. Good functional gains over the last 6 months has worsened once again.  Patient now s/p C5-6 anterior cervical decompression POD #1. Lower extremities have also become weak, wife reporting he "just stopped walking" in August and has essentially become w/c bound (independent with transfers to and from w/c) however requiring assist from wife for ADLs and to propell w/c.    Clinical Impression   Pt presents to OT with generalized weakness.  Anticipate he is close to his baseline level of functioning.  Pt was non ambulatory PTA.  He will benefit from OT to address the below deficits to allow him to return home with wife at supervision level     OT Assessment  Patient needs continued OT Services    Follow Up Recommendations  No OT follow up;Supervision/Assistance - 24 hour    Barriers to Discharge      Equipment Recommendations  None recommended by OT    Recommendations for Other Services    Frequency  Min 2X/week    Precautions / Restrictions Precautions Precautions: Fall;Cervical Required Braces or Orthoses: Cervical Brace Cervical Brace: Soft collar Restrictions Weight Bearing Restrictions: No       ADL  Eating/Feeding: Modified independent Where Assessed - Eating/Feeding: Chair;Bed level Grooming: Wash/dry hands;Wash/dry face;Teeth care;Minimal assistance Where Assessed - Grooming: Supported sitting Upper Body Bathing: Supervision/safety Where Assessed - Upper Body Bathing: Unsupported sitting Lower  Body Bathing: Minimal assistance Where Assessed - Lower Body Bathing: Lean right and/or left Upper Body Dressing: Set up;Supervision/safety Where Assessed - Upper Body Dressing: Unsupported sitting Lower Body Dressing: Minimal assistance Where Assessed - Lower Body Dressing: Lean right and/or left Toilet Transfer: Minimal assistance Toilet Transfer Method: Stand pivot;Squat pivot Toilet Transfer Equipment: Bedside commode Toileting - Clothing Manipulation and Hygiene: Minimal assistance Where Assessed - Glass blower/designer Manipulation and Hygiene: Lean right and/or left Equipment Used:  (soft cervical collar) Transfers/Ambulation Related to ADLs: Pt able to perform squat pivot transfer with min guard assist, and stand pivot with min guard assist ADL Comments: Pt reports he leans side to side to pull up pants at home.      OT Diagnosis: Generalized weakness;Ataxia  OT Problem List: Decreased strength;Decreased activity tolerance;Decreased coordination;Decreased knowledge of use of DME or AE;Decreased knowledge of precautions OT Treatment Interventions: Self-care/ADL training;DME and/or AE instruction;Therapeutic activities;Patient/family education   OT Goals(Current goals can be found in the care plan section) Acute Rehab OT Goals Patient Stated Goal: to walk OT Goal Formulation: With patient Time For Goal Achievement: 07/09/12 Potential to Achieve Goals: Good ADL Goals Pt Will Perform Lower Body Bathing: sitting/lateral leans;with set-up Pt Will Perform Lower Body Dressing: with set-up;sitting/lateral leans Pt Will Transfer to Toilet: with supervision;squat pivot transfer;stand pivot transfer;regular height toilet;grab bars Pt Will Perform Toileting - Clothing Manipulation and hygiene: with supervision;sitting/lateral leans  Visit Information  Last OT Received On: 07/02/12 Assistance Needed: +1 History of Present Illness: 78 year old male with h/o CVA and left sided weakness status  post previous C6-7 anterior cervical discectomy and fusion with allograft and her plating for treatment of a severe compressive myelopathy. Postoperative patient did reasonably  well. Good functional gains over the last 6 months has worsened once again.  Patient now s/p C5-6 anterior cervical decompression POD #1. Lower extremities have also become weak, wife reporting he "just stopped walking" in August and has essentially become w/c bound (independent with transfers to and from w/c) however requiring assist from wife for ADLs and to propell w/c.        Prior Functioning     Home Living Family/patient expects to be discharged to:: Private residence Living Arrangements: Spouse/significant other Available Help at Discharge: Family;Available 24 hours/day Type of Home: House Home Access: Level entry Home Layout: One level Home Equipment: Wheelchair - manual;Walker - 4 wheels;Grab bars - toilet;Grab bars - tub/shower;Shower seat (wheel in shower seat; lift chair) Additional Comments: Pt reports he was going to [facility] [place] until the last of last year Prior Function Level of Independence: Needs assistance Gait / Transfers Assistance Needed: Patient is not ambulatory however was performing SPT modified independently/supervision ADL's / Homemaking Assistance Needed: minA for bathing/dressing Communication / Swallowing Assistance Needed: no difficulties Comments: Pt reports he was ambulating with RW until first of the year Communication Communication: No difficulties         Vision/Perception     Cognition  Cognition Arousal/Alertness: Awake/alert Behavior During Therapy: WFL for tasks assessed/performed Overall Cognitive Status: Within Functional Limits for tasks assessed    Extremity/Trunk Assessment Upper Extremity Assessment Upper Extremity Assessment: RUE deficits/detail;LUE deficits/detail RUE Deficits / Details: mild ataxia RUE Coordination: decreased fine motor;decreased  gross motor LUE Deficits / Details: dysmetria LUE Coordination: decreased fine motor;decreased gross motor Lower Extremity Assessment Lower Extremity Assessment: LLE deficits/detail;RLE deficits/detail RLE Deficits / Details: grossly 4+/5, clonic jerking noted in standing especially when trying to initiate stepping RLE Coordination: decreased gross motor LLE Deficits / Details: grossly 4/5, clonic jerking noted in standing especially when trying to initiate stepping LLE Coordination: decreased gross motor Cervical / Trunk Assessment Cervical / Trunk Assessment: Kyphotic;Other exceptions Cervical / Trunk Exceptions: forward head     Mobility Bed Mobility Bed Mobility: Supine to Sit;Sitting - Scoot to Edge of Bed Supine to Sit: 6: Modified independent (Device/Increase time);HOB elevated Sitting - Scoot to Edge of Bed: 6: Modified independent (Device/Increase time) Details for Bed Mobility Assistance: pt sat right up into long sitting  Transfers Transfers: Sit to Stand;Stand to Sit Sit to Stand: 4: Min guard;With upper extremity assist;From bed;From chair/3-in-1 Stand to Sit: 4: Min guard;With upper extremity assist;To bed;To chair/3-in-1 Details for Transfer Assistance: guarding for safety.  Pt with ataxic movements.     Exercise     Balance Balance Balance Assessed: Yes Static Standing Balance Static Standing - Balance Support: Bilateral upper extremity supported Static Standing - Level of Assistance:  (mingaurdA)   End of Session OT - End of Session Equipment Utilized During Treatment: Cervical collar Activity Tolerance: Patient tolerated treatment well Patient left: in chair;with call bell/phone within reach  GO     Ivet Guerrieri M 07/02/2012, 1:38 PM

## 2012-07-02 NOTE — Progress Notes (Signed)
Postop day 1. Neck pain and upper extremity symptoms improved. Lower extremity weakness and loss of motor control unchanged.  Afebrile. Vital stable. Urine output good. Next soft. Dressing dry. Motor examination reveals improved upper extremity strength with some mild intrinsic and grip weakness bilaterally. Lower extremity strength remains diffusely weak grating out at 4/5.  Doing well following anterior cervical decompression and fusion. I remained concerned the patient has coexistent severe lumbar stenosis. This is never been fully evaluated with imaging. One is in the hospital I like to have an MRI scan of his lumbar spine to better evaluate this.

## 2012-07-02 NOTE — Evaluation (Signed)
Physical Therapy Evaluation Patient Details Name: [name] MRN: [id number] DOB: 1934-04-29 Today's Date: 07/02/2012 Time: 4098-1191 PT Time Calculation (min): 26 min  PT Assessment / Plan / Recommendation History of Present Illness  78 year old male with h/o CVA and left sided weakness status post previous C6-7 anterior cervical discectomy and fusion with allograft and her plating for treatment of a severe compressive myelopathy. Postoperative patient did reasonably well. Good functional gains over the last 6 months has worsened once again.  Patient now s/p C5-6 anterior cervical decompression POD #1. Lower extremities have also become weak over the past several months, wife reporting he "just stopped walking" in August and has essentially become w/c bound (independent with transfers to and from w/c) however requiring assist from wife for ADLs and to propel w/c.   Clinical Impression  Presents to PT with below impairments including weakness, decreased lower extremity coordination and imbalance impacting his safety. Will benefit physical therapy in the acute setting to maximize safety with transfers so as to facilitate safe d/c home and initiation of OPPT services to work on ambulation. Wife and patient feel good about going home and feel they know a good place to go to physical therapy in [place].     PT Assessment  Patient needs continued PT services    Follow Up Recommendations  Outpatient PT;Supervision/Assistance - 24 hour    Does the patient have the potential to tolerate intense rehabilitation      Barriers to Discharge        Equipment Recommendations  None recommended by PT    Recommendations for Other Services     Frequency Min 5X/week    Precautions / Restrictions Precautions Precautions: Fall;Cervical Required Braces or Orthoses: Cervical Brace Cervical Brace: Soft collar Restrictions Weight Bearing Restrictions: No   Pertinent Vitals/Pain Reports minimal soreness  anterior neck, area of incision      Mobility  Bed Mobility Bed Mobility: Supine to Sit Supine to Sit: 6: Modified independent (Device/Increase time);HOB elevated (30 degrees) Details for Bed Mobility Assistance: pt sat right up into long sitting  Transfers Transfers: Sit to Stand;Stand to Sit;Stand Pivot Transfers Sit to Stand: 4: Min guard;From bed;From chair/3-in-1;With upper extremity assist;With armrests Stand to Sit: 4: Min guard;To chair/3-in-1;With upper extremity assist;With armrests Stand Pivot Transfers: 4: Min guard Details for Transfer Assistance: gaurding for safety, no physical assist needed however pt with jerky/clonic instability in lower extremities when weight bearing needing close gaurding; difficulty controlling, limited tolerance for standing long periods of time Ambulation/Gait Ambulation/Gait Assistance: 4: Min assist Ambulation Distance (Feet): 2 Feet Assistive device: Rolling walker Ambulation/Gait Assistance Details: attempted taking 2 steps forward and backward with minA stability assist and using RW, pt with increase in clonic jerks when trying to initiate stepping needing increased assist; decrease tolerance for tall standing, legs give out quickly Gait Pattern: Step-through pattern Stairs: No    Exercises     PT Diagnosis: Difficulty walking;Abnormality of gait;Generalized weakness  PT Problem List: Decreased strength;Decreased activity tolerance;Decreased balance;Decreased coordination;Decreased knowledge of precautions PT Treatment Interventions: DME instruction;Gait training;Functional mobility training;Therapeutic activities;Therapeutic exercise;Balance training;Neuromuscular re-education;Patient/family education     PT Goals(Current goals can be found in the care plan section) Acute Rehab PT Goals Patient Stated Goal: to walk PT Goal Formulation: With patient Time For Goal Achievement: 07/09/12 Potential to Achieve Goals: Good  Visit  Information  Last PT Received On: 07/02/12 Assistance Needed: +1 (+2 for ambulation) History of Present Illness: 78 year old male with h/o CVA and left sided weakness  status post previous C6-7 anterior cervical discectomy and fusion with allograft and her plating for treatment of a severe compressive myelopathy. Postoperative patient did reasonably well. Good functional gains over the last 6 months has worsened once again.  Patient now s/p C5-6 anterior cervical decompression POD #1. Lower extremities have also become weak, wife reporting he "just stopped walking" in August and has essentially become w/c bound (independent with transfers to and from w/c) however requiring assist from wife for ADLs and to propell w/c.        Prior Functioning  Home Living Family/patient expects to be discharged to:: Private residence Living Arrangements: Spouse/significant other Available Help at Discharge: Family;Available 24 hours/day Type of Home: House Home Access: Level entry Home Layout: One level Home Equipment: Wheelchair - manual;Walker - 4 wheels;Grab bars - toilet;Grab bars - tub/shower;Shower seat Prior Function Level of Independence: Needs assistance Gait / Transfers Assistance Needed: Patient is not ambulatory however was performing SPT modified independently/supervision ADL's / Homemaking Assistance Needed: minA for bathing/dressing Communication / Swallowing Assistance Needed: no difficulties Communication Communication: No difficulties    Cognition  Cognition Arousal/Alertness: Awake/alert Behavior During Therapy: WFL for tasks assessed/performed Overall Cognitive Status: Within Functional Limits for tasks assessed    Extremity/Trunk Assessment Upper Extremity Assessment Upper Extremity Assessment: Defer to OT evaluation Lower Extremity Assessment Lower Extremity Assessment: LLE deficits/detail;RLE deficits/detail RLE Deficits / Details: grossly 4+/5, clonic jerking noted in standing  especially when trying to initiate stepping RLE Coordination: decreased gross motor LLE Deficits / Details: grossly 4/5, clonic jerking noted in standing especially when trying to initiate stepping LLE Coordination: decreased gross motor Cervical / Trunk Assessment Cervical / Trunk Assessment: Kyphotic;Other exceptions Cervical / Trunk Exceptions: forward head   Balance Balance Balance Assessed: Yes Static Standing Balance Static Standing - Balance Support: Bilateral upper extremity supported Static Standing - Level of Assistance:  (mingaurdA)  End of Session PT - End of Session Equipment Utilized During Treatment: Gait belt Activity Tolerance: Patient tolerated treatment well Patient left: in chair;with call bell/phone within reach Nurse Communication: Mobility status  GP     [facility] HELEN 07/02/2012, 10:40 AM

## 2012-07-03 ENCOUNTER — Inpatient Hospital Stay (HOSPITAL_COMMUNITY): Payer: Medicare Other

## 2012-07-03 NOTE — Progress Notes (Signed)
Physical Therapy Treatment Patient Details Name: [name] MRN: [id number] DOB: 04/27/1934 Today's Date: 07/03/2012 Time: 2841-3244 PT Time Calculation (min): 19 min  PT Assessment / Plan / Recommendation  PT Comments   Much improved control from yesterday. Performing SPT with supervision. Wife and patient feel confident with his mobility for d/c home, hopeful for tomorrow. Educated pt on lower extremity exercises for strengthening in prep for ambulation. Still feel OPPT is the best plan to progress his ambulation. Advised patient to wait for therapy to progress gait training.   Follow Up Recommendations  Outpatient PT;Supervision/Assistance - 24 hour     Does the patient have the potential to tolerate intense rehabilitation     Barriers to Discharge        Equipment Recommendations  None recommended by PT    Recommendations for Other Services    Frequency Min 3X/week   Progress towards PT Goals Progress towards PT goals: Progressing toward goals  Plan Current plan remains appropriate;Frequency needs to be updated    Precautions / Restrictions Precautions Precautions: Fall;Cervical Required Braces or Orthoses: Cervical Brace Cervical Brace: Soft collar   Pertinent Vitals/Pain Denies pain    Mobility  Bed Mobility Bed Mobility: Not assessed Transfers Transfers: Sit to Stand;Stand to Sit;Stand Pivot Transfers Sit to Stand: 5: Supervision;With upper extremity assist;From chair/3-in-1;From bed Stand to Sit: 5: Supervision;With upper extremity assist;To chair/3-in-1;To bed Stand Pivot Transfers: 5: Supervision (bed<>chair) Details for Transfer Assistance: supervision for safety, much improved from yesterday, ataxia still present however not as pronounced with improved control from the patient Ambulation/Gait Ambulation/Gait Assistance: 4: Min assist Assistive device: Standard walker Ambulation/Gait Assistance Details: pre-gait activity initiated with reciprocal steps fwd/bwd  x5 bilaterally, improved control from yesterday, still needing minA for stability Gait Pattern: Step-through pattern;Decreased stride length    Exercises General Exercises - Lower Extremity Long Arc Quad: AROM;Both;10 reps;Seated Hip Flexion/Marching: Standing Heel Raises: AROM;Both;10 reps;Seated Mini-Sqauts: AROM;Both;10 reps;Seated Other Exercises Other Exercises: educated wife and patient on exercise of sit<>stand to improve lower extremity strengthening     PT Goals (current goals can now be found in the care plan section) Acute Rehab PT Goals Patient Stated Goal: to walk  Visit Information  Last PT Received On: 07/03/12 Assistance Needed: +1 History of Present Illness: 78 year old male with h/o CVA and left sided weakness status post previous C6-7 anterior cervical discectomy and fusion with allograft and her plating for treatment of a severe compressive myelopathy. Postoperative patient did reasonably well. Good functional gains over the last 6 months has worsened once again.  Patient now s/p C5-6 anterior cervical decompression POD #1. Lower extremities have also become weak, wife reporting he "just stopped walking" in August and has essentially become w/c bound (independent with transfers to and from w/c) however requiring assist from wife for ADLs and to propell w/c.     Subjective Data  Patient Stated Goal: to walk   Cognition  Cognition Arousal/Alertness: Awake/alert Behavior During Therapy: WFL for tasks assessed/performed Overall Cognitive Status: Within Functional Limits for tasks assessed    Balance  Static Standing Balance Static Standing - Balance Support: Bilateral upper extremity supported Static Standing - Level of Assistance: 5: Stand by assistance  End of Session PT - End of Session Equipment Utilized During Treatment: Gait belt Activity Tolerance: Patient tolerated treatment well Patient left: in chair;with call bell/phone within reach   GP      [facility] HELEN 07/03/2012, 5:23 PM

## 2012-07-03 NOTE — Progress Notes (Signed)
OT Cancellation Note  Patient Details Name: [name] MRN: [id number] DOB: Nov 14, 1934   Cancelled Treatment:    Reason Eval/Treat Not Completed: Fatigue/lethargy limiting ability to participate - pt reports fatigue just after MRI. Attempted later, and pt sleeping soundly   [name] M 07/03/2012, 3:07 PM

## 2012-07-03 NOTE — Progress Notes (Signed)
Overall stable. Pain well controlled.  Afebrile with stable vitals. Neuro exam stable. Wound clean and dry.  MRI scan lumbar spine pending. Continue efforts at therapy.

## 2012-07-04 ENCOUNTER — Encounter (HOSPITAL_COMMUNITY): Payer: Self-pay | Admitting: Neurosurgery

## 2012-07-04 MED ORDER — HYDROCODONE-ACETAMINOPHEN 5-325 MG PO TABS: 1.0000 | ORAL_TABLET | ORAL | Status: DC | PRN

## 2012-07-04 MED ORDER — CYCLOBENZAPRINE HCL 10 MG PO TABS: 10.0000 mg | ORAL_TABLET | Freq: Three times a day (TID) | ORAL | Status: DC | PRN

## 2012-07-04 NOTE — Discharge Summary (Signed)
Physician Discharge Summary  Patient ID: [name] MRN: [id number] DOB/AGE: Jul 23, 1934 78 y.o.  Admit date: 07/01/2012 Discharge date: 07/04/2012  Admission Diagnoses:  Discharge Diagnoses:  Principal Problem:   Spinal stenosis of cervical region   Discharged Condition: good  Hospital Course: Patient in the hospital for treatment and evaluation of his severe cervical myelopathy secondary to cervical stenosis. Patient underwent uncomplicated C5-6 anterior cervical discectomy and fusion with allograft and her plating. Postoperatively he is to progress quite well. Neck pain and upper extremity pain and weakness are much improved. Lower extremity is much improved as well. During his hospitalization the patient also underwent an MRI scan of his lumbar spine to rule out significant lumbar stenosis as a cause for his lower t extremity weakness. This was negative for any severe stenosis.  Consults:   Significant Diagnostic Studies:   Treatments:   Discharge Exam: Blood pressure 136/49, pulse 73, temperature 98 F (36.7 C), temperature source Oral, resp. rate 18, height 5\' 9"  (1.753 m), weight 76.5 kg (168 lb 10.4 oz), SpO2 93.00%. Awake and alert. Oriented appropriate her cranial nerve function intact. Motor examination extremities demonstrates 5 over 5 strength except for some mild grip and intrinsic weakness bilaterally. Lower trimming strength is 4+ over 5 with moderate spasticity has an occasional clonus. Wound is well-healed. Chest and abdomen benign.  Disposition: 01-Home or Self Care  Discharge Orders   Future Orders Complete By Expires     Face-to-face encounter (required for Medicare/Medicaid patients)  As directed     Comments:      I Lenny Bouchillon A certify that this patient is under my care and that I, or a nurse practitioner or physician's assistant working with me, had a face-to-face encounter that meets the physician face-to-face encounter requirements with this patient on  07/04/2012. The encounter with the patient was in whole, or in part for the following medical condition(s) which is the primary reason for home health care (List medical condition): Cervical myelopathy secondary to cervical stenosis    Questions:      The encounter with the patient was in whole, or in part, for the following medical condition, which is the primary reason for home health care:  Cervical myelopathy    I certify that, based on my findings, the following services are medically necessary home health services:  Physical therapy    My clinical findings support the need for the above services:  Unable to leave home safely without assistance and/or assistive device    Further, I certify that my clinical findings support that this patient is homebound due to:  Unsafe ambulation due to balance issues    Reason for Medically Necessary Home Health Services:  Therapy- Home Adaptation to Facilitate Safety    Therapy- Investment banker, operational, Patent examiner    Home Health  As directed     Questions:      To provide the following care/treatments:  PT    OT        Medication List         aspirin EC 81 MG tablet  Take 81 mg by mouth daily.     cholecalciferol 1000 UNITS tablet  Commonly known as:  VITAMIN D  Take 1,000 Units by mouth daily.     cyclobenzaprine 10 MG tablet  Commonly known as:  FLEXERIL  Take 1 tablet (10 mg total) by mouth 3 (three) times daily as needed for muscle spasms.     fluticasone 50 MCG/ACT nasal  spray  Commonly known as:  FLONASE  Place 2 sprays into the nose daily as needed for rhinitis.     hydrochlorothiazide 25 MG tablet  Commonly known as:  HYDRODIURIL  Take 25 mg by mouth daily.     HYDROcodone-acetaminophen 5-325 MG per tablet  Commonly known as:  NORCO/VICODIN  Take 1-2 tablets by mouth every 4 (four) hours as needed.     ibuprofen 200 MG tablet  Commonly known as:  ADVIL,MOTRIN  Take 400 mg by mouth 2 (two) times daily as needed  for pain.     lisinopril 10 MG tablet  Commonly known as:  PRINIVIL,ZESTRIL  Take 10 mg by mouth daily.     metoCLOPramide 10 MG tablet  Commonly known as:  REGLAN  Take 10 mg by mouth at bedtime.     multivitamin with minerals Tabs  Take 1 tablet by mouth daily.     nitroGLYCERIN 0.4 MG SL tablet  Commonly known as:  NITROSTAT  Place 0.4 mg under the tongue every 5 (five) minutes as needed for chest pain.     omeprazole 20 MG capsule  Commonly known as:  PRILOSEC  Take 20 mg by mouth daily.     polyethylene glycol packet  Commonly known as:  MIRALAX / GLYCOLAX  Take 17 g by mouth as needed.     potassium chloride SA 20 MEQ tablet  Commonly known as:  K-DUR,KLOR-CON  Take 20 mEq by mouth 2 (two) times daily.     solifenacin 5 MG tablet  Commonly known as:  VESICARE  Take 5 mg by mouth daily.           Follow-up Information   Follow up with Jayme Cham A, MD. Call in 1 week. (ext [phone])    Contact information:   [address] [place] [address] [phone]       Signed: Shahara Hartsfield A 07/04/2012, 8:03 AM

## 2012-07-04 NOTE — Progress Notes (Signed)
PT Cancellation Note  Patient Details Name: [name] MRN: [id number] DOB: [birth year]   Cancelled Treatment:    Reason Eval/Treat Not Completed: Plans for d/c this morning. Discussed this with the patient who denies concerns from a physical therapy perspective. Feels confident transferring to and from the car and his w/c. Plan is for HHPT with progression to OPPT for continued gait training. I agree with this.    [facility] HELEN 07/04/2012, 8:48 AM Pager: [phone]

## 2012-07-04 NOTE — Care Management Note (Signed)
    Page 1 of 1   07/04/2012     2:34:34 PM   CARE MANAGEMENT NOTE 07/04/2012  Patient:  LAVARIS, SEXSON   Account Number:  [id number]  Date Initiated:  07/04/2012  Documentation initiated by:  Elmer Bales  Subjective/Objective Assessment:   Pt admitted with stenosis.     Action/Plan:   Will follow for discharge needs.   Anticipated DC Date:  07/04/2012   Anticipated DC Plan:  HOME/SELF CARE      DC Planning Services  CM consult      Choice offered to / List presented to:  C-1 Patient           Status of service:  Completed, signed off Medicare Important Message given?   (If response is "NO", the following Medicare IM given date fields will be blank) Date Medicare IM given:   Date Additional Medicare IM given:    Discharge Disposition:  HOME/SELF CARE  Per UR Regulation:  Reviewed for med. necessity/level of care/duration of stay  If discussed at Long Length of Stay Meetings, dates discussed:    Comments:  07/04/12 1030  Elmer Bales RN, MSN CM-  Met with patient and family to discuss outpatient PT. Pt has requested that he be set up at [facility] in [place], [place].  Contacted [facility] to verify that patient's insurance is accepted. [facility] has accepted patient referral and information was faxed.

## 2012-08-30 ENCOUNTER — Other Ambulatory Visit (HOSPITAL_COMMUNITY): Payer: Self-pay | Admitting: Family Medicine

## 2012-08-30 ENCOUNTER — Ambulatory Visit (HOSPITAL_COMMUNITY)
Admission: RE | Admit: 2012-08-30 | Discharge: 2012-08-30 | Disposition: A | Discharge status: Home / Self Care | Payer: PRIVATE HEALTH INSURANCE | Source: Ambulatory Visit | Attending: Family Medicine | Admitting: Family Medicine

## 2012-08-30 DIAGNOSIS — J189 Pneumonia, unspecified organism: Secondary | ICD-10-CM

## 2012-08-30 DIAGNOSIS — R079 Chest pain, unspecified: Secondary | ICD-10-CM | POA: Insufficient documentation

## 2012-08-30 DIAGNOSIS — Z87891 Personal history of nicotine dependence: Secondary | ICD-10-CM | POA: Insufficient documentation

## 2012-10-12 ENCOUNTER — Inpatient Hospital Stay (HOSPITAL_COMMUNITY)
Admission: EM | Admit: 2012-10-12 | Discharge: 2012-10-15 | DRG: 603 | Disposition: A | Discharge status: Home / Self Care | Payer: PRIVATE HEALTH INSURANCE | Attending: Family Medicine | Admitting: Family Medicine

## 2012-10-12 ENCOUNTER — Encounter (HOSPITAL_COMMUNITY): Payer: Self-pay | Admitting: Emergency Medicine

## 2012-10-12 ENCOUNTER — Emergency Department (HOSPITAL_COMMUNITY): Payer: PRIVATE HEALTH INSURANCE

## 2012-10-12 ENCOUNTER — Other Ambulatory Visit: Payer: Self-pay

## 2012-10-12 ENCOUNTER — Inpatient Hospital Stay (HOSPITAL_COMMUNITY): Payer: PRIVATE HEALTH INSURANCE

## 2012-10-12 DIAGNOSIS — M4802 Spinal stenosis, cervical region: Secondary | ICD-10-CM | POA: Diagnosis present

## 2012-10-12 DIAGNOSIS — IMO0002 Reserved for concepts with insufficient information to code with codable children: Secondary | ICD-10-CM

## 2012-10-12 DIAGNOSIS — Z981 Arthrodesis status: Secondary | ICD-10-CM

## 2012-10-12 DIAGNOSIS — K219 Gastro-esophageal reflux disease without esophagitis: Secondary | ICD-10-CM | POA: Diagnosis present

## 2012-10-12 DIAGNOSIS — G4730 Sleep apnea, unspecified: Secondary | ICD-10-CM | POA: Diagnosis present

## 2012-10-12 DIAGNOSIS — I69992 Facial weakness following unspecified cerebrovascular disease: Secondary | ICD-10-CM

## 2012-10-12 DIAGNOSIS — L03211 Cellulitis of face: Principal | ICD-10-CM | POA: Diagnosis present

## 2012-10-12 DIAGNOSIS — K029 Dental caries, unspecified: Secondary | ICD-10-CM | POA: Diagnosis present

## 2012-10-12 DIAGNOSIS — Z79899 Other long term (current) drug therapy: Secondary | ICD-10-CM

## 2012-10-12 DIAGNOSIS — H409 Unspecified glaucoma: Secondary | ICD-10-CM | POA: Diagnosis present

## 2012-10-12 DIAGNOSIS — F17200 Nicotine dependence, unspecified, uncomplicated: Secondary | ICD-10-CM | POA: Diagnosis present

## 2012-10-12 DIAGNOSIS — I6350 Cerebral infarction due to unspecified occlusion or stenosis of unspecified cerebral artery: Secondary | ICD-10-CM

## 2012-10-12 DIAGNOSIS — Z8673 Personal history of transient ischemic attack (TIA), and cerebral infarction without residual deficits: Secondary | ICD-10-CM

## 2012-10-12 DIAGNOSIS — L0201 Cutaneous abscess of face: Principal | ICD-10-CM | POA: Diagnosis present

## 2012-10-12 DIAGNOSIS — I10 Essential (primary) hypertension: Secondary | ICD-10-CM | POA: Diagnosis present

## 2012-10-12 DIAGNOSIS — R29810 Facial weakness: Secondary | ICD-10-CM

## 2012-10-12 LAB — URINALYSIS, ROUTINE W REFLEX MICROSCOPIC
Glucose, UA: NEGATIVE mg/dL
Hgb urine dipstick: NEGATIVE
Ketones, ur: NEGATIVE mg/dL
Leukocytes, UA: NEGATIVE
Protein, ur: NEGATIVE mg/dL
pH: 7 (ref 5.0–8.0)

## 2012-10-12 LAB — RAPID URINE DRUG SCREEN, HOSP PERFORMED
Amphetamines: NOT DETECTED
Barbiturates: NOT DETECTED
Benzodiazepines: NOT DETECTED
Cocaine: NOT DETECTED
Opiates: NOT DETECTED
Tetrahydrocannabinol: NOT DETECTED

## 2012-10-12 LAB — CBC
HCT: 37.8 % — ABNORMAL LOW (ref 39.0–52.0)
Hemoglobin: 12.7 g/dL — ABNORMAL LOW (ref 13.0–17.0)
MCH: 30.3 pg (ref 26.0–34.0)
MCV: 90.2 fL (ref 78.0–100.0)
RBC: 4.19 MIL/uL — ABNORMAL LOW (ref 4.22–5.81)
WBC: 7.3 10*3/uL (ref 4.0–10.5)

## 2012-10-12 LAB — BASIC METABOLIC PANEL
CO2: 28 mEq/L (ref 19–32)
Calcium: 9.3 mg/dL (ref 8.4–10.5)
Chloride: 101 mEq/L (ref 96–112)
Creatinine, Ser: 0.98 mg/dL (ref 0.50–1.35)
Glucose, Bld: 120 mg/dL — ABNORMAL HIGH (ref 70–99)

## 2012-10-12 MED ORDER — FLUTICASONE PROPIONATE 50 MCG/ACT NA SUSP
2.0000 | Freq: Every day | NASAL | Status: DC | PRN
  Filled 2012-10-12: qty 16

## 2012-10-12 MED ORDER — ADULT MULTIVITAMIN W/MINERALS CH
1.0000 | ORAL_TABLET | Freq: Every day | ORAL | Status: DC
  Administered 2012-10-13 – 2012-10-15 (×3): 1 via ORAL
  Filled 2012-10-12 (×3): qty 1

## 2012-10-12 MED ORDER — VITAMIN D 1000 UNITS PO TABS
1000.0000 [IU] | ORAL_TABLET | Freq: Every day | ORAL | Status: DC
  Administered 2012-10-13 – 2012-10-15 (×3): 1000 [IU] via ORAL
  Filled 2012-10-12 (×3): qty 1

## 2012-10-12 MED ORDER — METOCLOPRAMIDE HCL 10 MG PO TABS
10.0000 mg | ORAL_TABLET | Freq: Every day | ORAL | Status: DC
  Administered 2012-10-12 – 2012-10-14 (×3): 10 mg via ORAL
  Filled 2012-10-12 (×3): qty 1

## 2012-10-12 MED ORDER — ENOXAPARIN SODIUM 40 MG/0.4ML ~~LOC~~ SOLN
40.0000 mg | SUBCUTANEOUS | Status: DC
  Administered 2012-10-12 – 2012-10-13 (×2): 40 mg via SUBCUTANEOUS
  Filled 2012-10-12 (×3): qty 0.4

## 2012-10-12 MED ORDER — CLOPIDOGREL BISULFATE 75 MG PO TABS
75.0000 mg | ORAL_TABLET | Freq: Every day | ORAL | Status: DC
  Administered 2012-10-13 – 2012-10-15 (×3): 75 mg via ORAL
  Filled 2012-10-12 (×3): qty 1

## 2012-10-12 MED ORDER — SODIUM CHLORIDE 0.9 % IJ SOLN: 3.0000 mL | INTRAMUSCULAR | Status: DC | PRN

## 2012-10-12 MED ORDER — POTASSIUM CHLORIDE CRYS ER 20 MEQ PO TBCR
20.0000 meq | EXTENDED_RELEASE_TABLET | Freq: Two times a day (BID) | ORAL | Status: DC
Start: 2012-10-12 — End: 2012-10-15
  Administered 2012-10-12 – 2012-10-15 (×6): 20 meq via ORAL
  Filled 2012-10-12 (×6): qty 1

## 2012-10-12 MED ORDER — PANTOPRAZOLE SODIUM 40 MG PO TBEC
40.0000 mg | DELAYED_RELEASE_TABLET | Freq: Every day | ORAL | Status: DC
  Administered 2012-10-13 – 2012-10-15 (×3): 40 mg via ORAL
  Filled 2012-10-12 (×3): qty 1

## 2012-10-12 MED ORDER — HYDROCODONE-ACETAMINOPHEN 5-325 MG PO TABS
1.0000 | ORAL_TABLET | Freq: Four times a day (QID) | ORAL | Status: DC | PRN
  Administered 2012-10-12 – 2012-10-14 (×3): 1 via ORAL
  Filled 2012-10-12 (×3): qty 1

## 2012-10-12 MED ORDER — SENNOSIDES-DOCUSATE SODIUM 8.6-50 MG PO TABS: 1.0000 | ORAL_TABLET | Freq: Every evening | ORAL | Status: DC | PRN

## 2012-10-12 MED ORDER — ACETAMINOPHEN 650 MG RE SUPP: 650.0000 mg | RECTAL | Status: DC | PRN

## 2012-10-12 MED ORDER — NITROGLYCERIN 0.4 MG SL SUBL: 0.4000 mg | SUBLINGUAL_TABLET | SUBLINGUAL | Status: DC | PRN

## 2012-10-12 MED ORDER — POLYETHYLENE GLYCOL 3350 17 G PO PACK
17.0000 g | PACK | ORAL | Status: DC | PRN
  Administered 2012-10-14: 17 g via ORAL
  Filled 2012-10-12: qty 1

## 2012-10-12 MED ORDER — LISINOPRIL 10 MG PO TABS
10.0000 mg | ORAL_TABLET | Freq: Every day | ORAL | Status: DC
  Administered 2012-10-13 – 2012-10-15 (×3): 10 mg via ORAL
  Filled 2012-10-12 (×3): qty 1

## 2012-10-12 MED ORDER — ONDANSETRON HCL 4 MG/2ML IJ SOLN: 4.0000 mg | Freq: Four times a day (QID) | INTRAMUSCULAR | Status: DC | PRN

## 2012-10-12 MED ORDER — SODIUM CHLORIDE 0.9 % IJ SOLN
3.0000 mL | Freq: Two times a day (BID) | INTRAMUSCULAR | Status: DC
  Administered 2012-10-12 – 2012-10-14 (×5): 3 mL via INTRAVENOUS

## 2012-10-12 MED ORDER — ACETAMINOPHEN 325 MG PO TABS
650.0000 mg | ORAL_TABLET | ORAL | Status: DC | PRN
  Administered 2012-10-13: 650 mg via ORAL
  Filled 2012-10-12: qty 2

## 2012-10-12 MED ORDER — DARIFENACIN HYDROBROMIDE ER 7.5 MG PO TB24
7.5000 mg | ORAL_TABLET | Freq: Every day | ORAL | Status: DC
  Administered 2012-10-13 – 2012-10-15 (×3): 7.5 mg via ORAL
  Filled 2012-10-12 (×5): qty 1

## 2012-10-12 MED ORDER — SODIUM CHLORIDE 0.9 % IV SOLN: 250.0000 mL | INTRAVENOUS | Status: DC | PRN

## 2012-10-12 MED ORDER — HYDROCHLOROTHIAZIDE 25 MG PO TABS
25.0000 mg | ORAL_TABLET | Freq: Every day | ORAL | Status: DC
  Administered 2012-10-13 – 2012-10-15 (×3): 25 mg via ORAL
  Filled 2012-10-12 (×3): qty 1

## 2012-10-12 NOTE — ED Provider Notes (Addendum)
CSN: [id number]     Arrival date & time 10/12/12  1207 History   First MD Initiated Contact with Patient 10/12/12 1224     Chief Complaint  Patient presents with  . Facial Swelling   (Consider location/radiation/quality/duration/timing/severity/associated sxs/prior Treatment) HPI Comments: Wife noticed some R sided facial droop that began yesterday morning. Also stated some swelling underneath the R lateral eye that began yesterday with some associated facial swelling.   Patient is a 78 y.o. male presenting with neurologic complaint. The history is provided by the patient.  Neurologic Problem This is a new problem. The current episode started yesterday. The problem occurs constantly. The problem has not changed since onset.Pertinent negatives include no chest pain, no abdominal pain and no shortness of breath. Nothing aggravates the symptoms. Nothing relieves the symptoms. He has tried nothing for the symptoms.    Past Medical History  Diagnosis Date  . Hypertension   . Stroke     Left side weakness.  . Shortness of breath     with activity  . Sleep apnea     does not use CPAP.  Tested maybe 5- 10 years ago  . GERD (gastroesophageal reflux disease)   . Constipation   . Glaucoma    Past Surgical History  Procedure Laterality Date  . Hernia repair Right     inguinal  . Cervical fusion    . Anterior cervical decomp/discectomy fusion N/A 07/01/2012    Procedure: ANTERIOR CERVICAL DECOMPRESSION/DISCECTOMY FUSION CERVICAL FIVE-SIX [name] REMOVAL CERVICAL SIX-SEVEN;  Surgeon: Temple Pacini, MD;  Location: [facility];  Service: Neurosurgery;  Laterality: N/A;   No family history on file. History  Substance Use Topics  . Smoking status: Current Every Day Smoker -- 0.40 packs/day for 60 years  . Smokeless tobacco: Not on file  . Alcohol Use: No    Review of Systems  Constitutional: Negative for fever.  Respiratory: Negative for cough and shortness of breath.   Cardiovascular:  Negative for chest pain.  Gastrointestinal: Negative for vomiting and abdominal pain.  All other systems reviewed and are negative.    Allergies  Aspirin  Home Medications   Current Outpatient Rx  Name  Route  Sig  Dispense  Refill  . aspirin EC 81 MG tablet   Oral   Take 81 mg by mouth daily.         . cholecalciferol (VITAMIN D) 1000 UNITS tablet   Oral   Take 1,000 Units by mouth daily.         . fluticasone (FLONASE) 50 MCG/ACT nasal spray   Nasal   Place 2 sprays into the nose daily as needed for rhinitis.         . hydrochlorothiazide (HYDRODIURIL) 25 MG tablet   Oral   Take 25 mg by mouth daily.         [name] lisinopril (PRINIVIL,ZESTRIL) 10 MG tablet   Oral   Take 10 mg by mouth daily.         . metoCLOPramide (REGLAN) 10 MG tablet   Oral   Take 10 mg by mouth at bedtime.         . Multiple Vitamin (MULTIVITAMIN WITH MINERALS) TABS   Oral   Take 1 tablet by mouth daily.         [name] omeprazole (PRILOSEC) 20 MG capsule   Oral   Take 20 mg by mouth daily.         . polyethylene glycol (MIRALAX / GLYCOLAX) packet  Oral   Take 17 g by mouth as needed.         . potassium chloride SA (K-DUR,KLOR-CON) 20 MEQ tablet   Oral   Take 20 mEq by mouth 2 (two) times daily.         . solifenacin (VESICARE) 5 MG tablet   Oral   Take 5 mg by mouth daily.         . nitroGLYCERIN (NITROSTAT) 0.4 MG SL tablet   Sublingual   Place 0.4 mg under the tongue every 5 (five) minutes as needed for chest pain.          BP 116/55  Pulse 72  Temp(Src) 98.6 F (37 C) (Oral)  Resp 23  Ht 5\' 9"  (1.753 m)  Wt 148 lb (67.132 kg)  BMI 21.85 kg/m2  SpO2 93% Physical Exam  Nursing note and vitals reviewed. Constitutional: He is oriented to person, place, and time. He appears well-developed and well-nourished. No distress.  HENT:  Head: Normocephalic and atraumatic.    Mouth/Throat: No oropharyngeal exudate.  No fluctuance or marked cellulitis  Eyes:  EOM are normal. Pupils are equal, round, and reactive to light.  Neck: Normal range of motion. Neck supple.  Cardiovascular: Normal rate and regular rhythm.  Exam reveals no friction rub.   No murmur heard. Pulmonary/Chest: Effort normal and breath sounds normal. No respiratory distress. He has no wheezes. He has no rales.  Abdominal: He exhibits no distension. There is no tenderness. There is no rebound.  Musculoskeletal: Normal range of motion. He exhibits no edema.  Neurological: He is alert and oriented to person, place, and time. A cranial nerve deficit (R sided facial droop with sparing of the forehead.) is present. He exhibits normal muscle tone. Coordination normal.  Skin: He is not diaphoretic.    ED Course  Procedures (including critical care time) Labs Review Labs Reviewed  CBC - Abnormal; Notable for the following:    RBC 4.19 (*)    Hemoglobin 12.7 (*)    HCT 37.8 (*)    All other components within normal limits  BASIC METABOLIC PANEL  URINALYSIS, ROUTINE W REFLEX MICROSCOPIC  URINE RAPID DRUG SCREEN (HOSP PERFORMED)   Imaging Review No results found.  EKG Interpretation   None       Date: 10/12/2012  Rate: 72  Rhythm: normal sinus rhythm  QRS Axis: normal  Intervals: normal  ST/T Wave abnormalities: normal  Conduction Disutrbances:first-degree A-V block   Narrative Interpretation:   Old EKG Reviewed: unchanged   MDM   1. Facial droop due to stroke    51M here with R sided facial droop, constant since yesterday morning. Patient has hx of stroke, is currently on aspirin. He also has mild R lateral lower eyelid swelling at lateral orbit, not within eyelid itself. Here, AFVSS, relaxing comfortably. R facial droop with sparing of the forehead. Normal strength and sensation in upper and lower extremities. Cannot obtain MRI, outside of the tPA window also. Will start with CT of Head.   CT without acute infarct. Admitted for stroke workup.   Dagmar Hait, MD 10/12/12 1478  Dagmar Hait, MD 10/12/12 [phone]

## 2012-10-12 NOTE — ED Notes (Signed)
Pt wife took pt's wheelchair, that was from home, to the car prior to admission.

## 2012-10-12 NOTE — Progress Notes (Signed)
Clarified the NPO order with Dr. Ardyth Harps.  According to her orders the patient could be progressed to eat if he passed the RN swallow eval.  According to the Emergency Room report the patient did pass his screen.  So the patient was placed on a heart healthy diet due to his heart hx.

## 2012-10-12 NOTE — ED Notes (Signed)
Wife states pt woke up yesterday with facial swelling on the right. States this morning his mouth was drawn to the right

## 2012-10-12 NOTE — H&P (Signed)
[facility]          History and Physical    PCP:   Milana Obey, MD   Chief Complaint:  Right-sided facial droop  HPI: Patient is a very pleasant 78 year old African American man with history of hypertension, stroke x2 in the past as well as recent neck surgery for spinal stenosis. He presents to the hospital with a 2 day history of right-sided facial droop. Patient's wife is present at time of my interview. She states that she first noticed some "swelling" to the right side of his face that began about 3 days ago. Yesterday morning she noted right-sided facial droop but they did not seek medical attention. This morning when he woke up facial droop was still present so they decided to seek medical attention at that time. Of note patient denies dysarthria, difficulty swallowing, excessive drooling, gait unsteadiness or imbalance as well as focal weakness. We have been asked to admit him for further evaluation and management.  Allergies:   Allergies  Allergen Reactions  . Aspirin     REACTION: vomiting, tolerates 81mg  daily      Past Medical History  Diagnosis Date  . Hypertension   . Stroke     Left side weakness.  . Shortness of breath     with activity  . Sleep apnea     does not use CPAP.  Tested maybe 5- 10 years ago  . GERD (gastroesophageal reflux disease)   . Constipation   . Glaucoma     Past Surgical History  Procedure Laterality Date  . Hernia repair Right     inguinal  . Cervical fusion    . Anterior cervical decomp/discectomy fusion N/A 07/01/2012    Procedure: ANTERIOR CERVICAL DECOMPRESSION/DISCECTOMY FUSION CERVICAL FIVE-SIX [name] REMOVAL CERVICAL SIX-SEVEN;  Surgeon: Temple Pacini, MD;  Location: [facility];  Service: Neurosurgery;  Laterality: N/A;    Prior to Admission medications   Medication Sig Start Date End Date Taking? Authorizing Provider  aspirin EC 81 MG tablet Take 81 mg by mouth daily.   Yes Historical Provider,  MD  cholecalciferol (VITAMIN D) 1000 UNITS tablet Take 1,000 Units by mouth daily.   Yes Historical Provider, MD  fluticasone (FLONASE) 50 MCG/ACT nasal spray Place 2 sprays into the nose daily as needed for rhinitis.   Yes Historical Provider, MD  hydrochlorothiazide (HYDRODIURIL) 25 MG tablet Take 25 mg by mouth daily.   Yes Historical Provider, MD  lisinopril (PRINIVIL,ZESTRIL) 10 MG tablet Take 10 mg by mouth daily.   Yes Historical Provider, MD  metoCLOPramide (REGLAN) 10 MG tablet Take 10 mg by mouth at bedtime.   Yes Historical Provider, MD  Multiple Vitamin (MULTIVITAMIN WITH MINERALS) TABS Take 1 tablet by mouth daily.   Yes Historical Provider, MD  omeprazole (PRILOSEC) 20 MG capsule Take 20 mg by mouth daily.   Yes Historical Provider, MD  polyethylene glycol (MIRALAX / GLYCOLAX) packet Take 17 g by mouth as needed.   Yes Historical Provider, MD  potassium chloride SA (K-DUR,KLOR-CON) 20 MEQ tablet Take 20 mEq by mouth 2 (two) times daily.   Yes Historical Provider, MD  solifenacin (VESICARE) 5 MG tablet Take 5 mg by mouth daily.   Yes Historical Provider, MD  nitroGLYCERIN (NITROSTAT) 0.4 MG SL tablet Place 0.4 mg under the tongue every 5 (five) minutes as needed for chest pain.    Historical Provider, MD    Social History:  reports that he has been smoking.  He does  not have any smokeless tobacco history on file. He reports that he does not drink alcohol or use illicit drugs.  No family history on file.  Review of Systems:  Constitutional: Denies fever, chills, diaphoresis, appetite change and fatigue.  HEENT: Denies photophobia, eye pain, hearing loss, ear pain, congestion, sore throat, rhinorrhea, sneezing, mouth sores, trouble swallowing, neck pain, neck stiffness and tinnitus.   Respiratory: Denies SOB, DOE, cough, chest tightness,  and wheezing.   Cardiovascular: Denies chest pain, palpitations and leg swelling.  Gastrointestinal: Denies nausea, vomiting, abdominal pain,  diarrhea, constipation, blood in stool and abdominal distention.  Genitourinary: Denies dysuria, urgency, frequency, hematuria, flank pain and difficulty urinating.  Endocrine: Denies: hot or cold intolerance, sweats, changes in hair or nails, polyuria, polydipsia. Musculoskeletal: Denies myalgias, back pain, joint swelling, arthralgias and gait problem.  Skin: Denies pallor, rash and wound.  Neurological: Denies dizziness, seizures, syncope, weakness, light-headedness, numbness and headaches.  Hematological: Denies adenopathy. Easy bruising, personal or family bleeding history  Psychiatric/Behavioral: Denies suicidal ideation, mood changes, confusion, nervousness, sleep disturbance and agitation   Physical Exam: Blood pressure 125/69, pulse 68, temperature 98.6 F (37 C), temperature source Oral, resp. rate 17, height 5\' 9"  (1.753 m), weight 67.132 kg (148 lb), SpO2 100.00%. General: Alert, awake, oriented x3, no distress. HEENT: Normocephalic, atraumatic, pupils equal round and reactive to light, extraocular movements intact, moist mucous membranes, right scleral injection, puffiness under the right eye that is tender to palpation. Neck: Supple, no JVD, no lymphadenopathy, no bruits, no goiter, visible right sided scar of the lower neck from recent surgery. Cardiovascular: Regular rate and rhythm, no murmurs, rubs or gallops. Lungs: Clear to auscultation bilaterally. Abdomen: Soft, nontender, nondistended, positive bowel sounds, no masses or organomegaly noted. Extremities: No clubbing, cyanosis or edema, positive pedal pulses. Neurologic: Mental status intact, right-sided facial droop otherwise cranial nerves intact, muscle strength is 5 out of 5 bilateral and symmetrical, DTRs are 2+ symmetric, normal finger-nose and heel-to-shin, downgoing Babinski bilaterally.  Labs on Admission:  Results for orders placed during the hospital encounter of 10/12/12 (from the past 48 hour(s))  CBC      Status: Abnormal   Collection Time    10/12/12  1:40 PM      Result Value Range   WBC 7.3  4.0 - 10.5 K/uL   RBC 4.19 (*) 4.22 - 5.81 MIL/uL   Hemoglobin 12.7 (*) 13.0 - 17.0 g/dL   HCT 16.1 (*) 09.6 - 04.5 %   MCV 90.2  78.0 - 100.0 fL   MCH 30.3  26.0 - 34.0 pg   MCHC 33.6  30.0 - 36.0 g/dL   RDW 40.9  81.1 - 91.4 %   Platelets 197  150 - 400 K/uL  BASIC METABOLIC PANEL     Status: Abnormal   Collection Time    10/12/12  1:40 PM      Result Value Range   Sodium 135  135 - 145 mEq/L   Potassium 3.8  3.5 - 5.1 mEq/L   Chloride 101  96 - 112 mEq/L   CO2 28  19 - 32 mEq/L   Glucose, Bld 120 (*) 70 - 99 mg/dL   BUN 15  6 - 23 mg/dL   Creatinine, Ser 7.82  0.50 - 1.35 mg/dL   Calcium 9.3  8.4 - 95.6 mg/dL   GFR calc non Af Amer 78 (*) >90 mL/min   GFR calc Af Amer >90  >90 mL/min   Comment: (NOTE)  The eGFR has been calculated using the CKD EPI equation.     This calculation has not been validated in all clinical situations.     eGFR's persistently <90 mL/min signify possible Chronic Kidney     Disease.  URINALYSIS, ROUTINE W REFLEX MICROSCOPIC     Status: Abnormal   Collection Time    10/12/12  1:45 PM      Result Value Range   Color, Urine YELLOW  YELLOW   APPearance CLEAR  CLEAR   Specific Gravity, Urine 1.020  1.005 - 1.030   pH 7.0  5.0 - 8.0   Glucose, UA NEGATIVE  NEGATIVE mg/dL   Hgb urine dipstick NEGATIVE  NEGATIVE   Bilirubin Urine NEGATIVE  NEGATIVE   Ketones, ur NEGATIVE  NEGATIVE mg/dL   Protein, ur NEGATIVE  NEGATIVE mg/dL   Urobilinogen, UA 2.0 (*) 0.0 - 1.0 mg/dL   Nitrite NEGATIVE  NEGATIVE   Leukocytes, UA NEGATIVE  NEGATIVE   Comment: MICROSCOPIC NOT DONE ON URINES WITH NEGATIVE PROTEIN, BLOOD, LEUKOCYTES, NITRITE, OR GLUCOSE <1000 mg/dL.  URINE RAPID DRUG SCREEN (HOSP PERFORMED)     Status: None   Collection Time    10/12/12  1:45 PM      Result Value Range   Opiates NONE DETECTED  NONE DETECTED   Cocaine NONE DETECTED  NONE DETECTED    Benzodiazepines NONE DETECTED  NONE DETECTED   Amphetamines NONE DETECTED  NONE DETECTED   Tetrahydrocannabinol NONE DETECTED  NONE DETECTED   Barbiturates NONE DETECTED  NONE DETECTED   Comment:            DRUG SCREEN FOR MEDICAL PURPOSES     ONLY.  IF CONFIRMATION IS NEEDED     FOR ANY PURPOSE, NOTIFY LAB     WITHIN 5 DAYS.                LOWEST DETECTABLE LIMITS     FOR URINE DRUG SCREEN     Drug Class       Cutoff (ng/mL)     Amphetamine      1000     Barbiturate      200     Benzodiazepine   200     Tricyclics       300     Opiates          300     Cocaine          300     THC              50    Radiological Exams on Admission: Ct Head Wo Contrast  10/12/2012   CLINICAL DATA:  Patient with left side of mouth drawn. History of hypertension and stroke.  EXAM: CT HEAD WITHOUT CONTRAST  TECHNIQUE: Contiguous axial images were obtained from the base of the skull through the vertex without intravenous contrast.  COMPARISON:  09/08/2008.  FINDINGS: The ventricles are normal in size, for this patient's age, and normal configuration. There is sulcal enlargement reflecting volume loss, greatest from the cerebellum.  There are no parenchymal masses or mass effect. There is an old right inferior cerebellar infarct. Mild white matter hypoattenuation is noted most consistent with chronic microvascular ischemic change. There is no evidence of a recent cortical infarct.  There are no extra-axial masses or abnormal fluid collections.  There is no intracranial hemorrhage.  The visualized sinuses and mastoid air cells are clear.  IMPRESSION: 1. No acute intracranial abnormalities. 2. Age related  volume loss. 3. Old right cerebellar infarct. Mild chronic microvascular ischemic change.   Electronically Signed   By: Amie Portland M.D.   On: 10/12/2012 14:37    Assessment/Plan Principal Problem:   Facial droop Active Problems:   HYPERTENSION   CVA   Spinal stenosis of cervical region   Right-sided  facial droop/rule out CVA -Patient has a history of prior CVAs, both he and his wife corroborate that this right-sided facial droop is new from 2 days ago. -Will upgrade aspirin to Plavix, we'll admit to telemetry, will check carotid Dopplers and 2-D echoes, we'll order an MRI on Monday morning. We'll request neurology evaluation on Monday. -PT/OT evaluations.  Hypertension -Well controlled. -Continue current medications.  DVT prophylaxis -Lovenox.  CODE STATUS -Full code.  Time Spent on Admission: 75 minutes  HERNANDEZ ACOSTA,ESTELA [facility] Pager: [phone] 10/12/2012, 4:20 PM

## 2012-10-13 ENCOUNTER — Inpatient Hospital Stay (HOSPITAL_COMMUNITY): Payer: PRIVATE HEALTH INSURANCE

## 2012-10-13 LAB — LIPID PANEL
LDL Cholesterol: 77 mg/dL (ref 0–99)
Total CHOL/HDL Ratio: 3.4 RATIO
VLDL: 19 mg/dL (ref 0–40)

## 2012-10-13 NOTE — Evaluation (Signed)
Physical Therapy Evaluation Patient Details Name: [name] MRN: [id number] DOB: [birth year] Today's Date: 10/13/2012 Time: 8295-6213 PT Time Calculation (min): 17 min  PT Assessment / Plan / Recommendation History of Present Illness  78 year old male status post C6-7 anterior cervical discectomy and fusion for treatment of a severe compressive myelopathy. Patient did reasonably well but over the past 8 months has had recurrent neck pain and upper extremity pain. Patient also with increasing difficulty with lower extremity weakness. He is essentially become nonambulatory.  He has been admitted for possible stroke.   Clinical Impression  [name] is a pleasant 78 year old male admitted to [facility] for ischemic stroke with impairments listed below.  At this time pt reports that he has been using a W/C for a year since his past stroke.  He attended OP PT to address UE weakness s/p cervical discectomy and did not address his LE weakness.  At this time pt demonstrates mild coordination deficits to his BLE likely due to generalized weakness and decreased mobility.  Pt reports that his wife helps him with his exercises 3x/wk and he states she tells him to "let her do the work".  Educated pt that he needs to be doing his exercises along with her guidance and to decrease use of caregiver work. Pt would benefit from continued care in the hospital and with HHPT in order to decrease burden of care to caregiver and educate patient and family on importance of independent mobility to improve QOL to patient and caregiver.     PT Assessment  Patient needs continued PT services    Follow Up Recommendations  Home health PT;Outpatient PT    Does the patient have the potential to tolerate intense rehabilitation      Barriers to Discharge        Equipment Recommendations       Recommendations for Other Services     Frequency Min 3X/week    Precautions / Restrictions Precautions Precautions: Fall   Pertinent  Vitals/Pain Denies pain     Mobility  Bed Mobility Bed Mobility: Rolling Left;Left Sidelying to Sit Rolling Left: 6: Modified independent (Device/Increase time);With rail Left Sidelying to Sit: 6: Modified independent (Device/Increase time);HOB flat;With rails Transfers Transfers: Sit to Stand;Stand to Sit Sit to Stand: 5: Supervision;With armrests;From bed Stand to Sit: 5: Supervision;To chair/3-in-1;To bed Details for Transfer Assistance: cueing for handplacement with RW Ambulation/Gait Ambulation/Gait Assistance: 4: Min assist Ambulation Distance (Feet): 5 Feet Assistive device: Rolling walker Gait Pattern: Step-to pattern Gait velocity: decreased    Exercises General Exercises - Lower Extremity Ankle Circles/Pumps: AROM;Both;10 reps;Seated;Strengthening Long Arc Quad: AROM;Strengthening;Both;10 reps;Seated Heel Slides: AROM;Both;10 reps;Seated Hip ABduction/ADduction: AROM;Both;10 reps;Seated Straight Leg Raises: AROM;Seated;Both;10 reps   PT Diagnosis: Difficulty walking;Abnormality of gait;Generalized weakness  PT Problem List: Decreased strength;Decreased activity tolerance;Decreased mobility PT Treatment Interventions: Gait training;Stair training;Functional mobility training;Therapeutic activities;Therapeutic exercise;Patient/family education     PT Goals(Current goals can be found in the care plan section) Acute Rehab PT Goals Patient Stated Goal: "I should be able to walk more." Pt reports that he is worried that he will not be able to afford his co-pay for outpatient therapy.  PT Goal Formulation: With patient Time For Goal Achievement: 10/18/12 Potential to Achieve Goals: Good  Visit Information  Last PT Received On: 10/13/12 History of Present Illness: 78 year old male status post C6-7 anterior cervical discectomy and fusion for treatment of a severe compressive myelopathy. Patient did reasonably well but over the past 8 months has had recurrent  neck pain and  upper extremity pain. Patient also with increasing difficulty with lower extremity weakness. He is essentially become nonambulatory.  He has been admitted for possible stroke.        Prior Functioning  Home Living Family/patient expects to be discharged to:: Private residence Living Arrangements: Spouse/significant other Type of Home: House Home Access: Level entry Home Layout: One level Home Equipment: Wheelchair - manual;Walker - 4 wheels;Grab bars - toilet;Grab bars - tub/shower;Shower seat Additional Comments: Pt reports recent outpatient PT to address UE weakness from cervical surgery. Prior Function Level of Independence: Needs assistance Gait / Transfers Assistance Needed: Reports requiring WC and assistance w/SPT.  Comments: Pt reports he was ambulating with RW until first of the year Communication Communication: No difficulties    Cognition  Cognition Arousal/Alertness: Awake/alert Behavior During Therapy: WFL for tasks assessed/performed Overall Cognitive Status: Within Functional Limits for tasks assessed    Extremity/Trunk Assessment Lower Extremity Assessment Lower Extremity Assessment: Generalized weakness;LLE deficits/detail LLE Deficits / Details: Hip/knee/ankle 3/5    Balance Balance Balance Assessed: Yes Static Sitting Balance Static Sitting - Balance Support: No upper extremity supported;Feet supported Static Sitting - Level of Assistance: 7: Independent Static Sitting - Comment/# of Minutes: x5 minutes Static Standing Balance Static Standing - Balance Support: Bilateral upper extremity supported Static Standing - Level of Assistance: 4: Min assist Static Standing - Comment/# of Minutes: has mild coordination/strength deficits to BLE requiring min A  End of Session PT - End of Session Equipment Utilized During Treatment: Gait belt Activity Tolerance: Patient tolerated treatment well Patient left: in chair;with call bell/phone within reach Nurse  Communication: Mobility status  GP     Jaqulyn Chancellor, MPT, ATC 10/13/2012, 10:07 AM

## 2012-10-13 NOTE — Progress Notes (Signed)
Notified Dr. Ardyth Harps of the patients complaint of noBM x1 week.  Voiced to her the patient is eating well and on pain medications.  Suggested that the patient take dulcolax;ax po since that is what he is takes at home. New orders given and followed.

## 2012-10-14 ENCOUNTER — Inpatient Hospital Stay (HOSPITAL_COMMUNITY): Payer: PRIVATE HEALTH INSURANCE

## 2012-10-14 DIAGNOSIS — I517 Cardiomegaly: Secondary | ICD-10-CM

## 2012-10-14 MED ORDER — ENSURE COMPLETE PO LIQD
237.0000 mL | Freq: Two times a day (BID) | ORAL | Status: DC
Start: 2012-10-15 — End: 2012-10-15

## 2012-10-14 NOTE — Progress Notes (Addendum)
INITIAL NUTRITION ASSESSMENT  DOCUMENTATION CODES Per approved criteria  -Not Applicable   INTERVENTION:  Recommend liberalize diet as much as medically feasible to increase food choices.  Add milk with all meals  Bowel regimen per MD  Ensure Complete po BID between meals, each supplement provides 350 kcal and 13 grams of protein.  NUTRITION DIAGNOSIS:    Inadequate oral intake related to decreased appetite  as evidenced by diet hx.   Goal: Pt to meet >/= 90% of their estimated nutrition needs   Monitor:  Diet tolerance, meal and supplement intake Weight changes and labs   Reason for Assessment: Malnutrition Screen Score = 4  78 y.o. male  Admitting Dx: Facial droop  ASSESSMENT: Pt from home. Hx includes strokes, HTN, constipation.  He says appetite has been poor lately. Usually eats 2 meals per day: late breakfast and dinner. Feeds himself. He uses a wheelchair at home. Drinks Ensure (receives delivery to home) prefers chocolate. Unintentional weight loss of 12%, 21# x 4 months considered severe. Unable to complete nutrition focused exam at this time.  Patient Active Problem List   Diagnosis Date Noted  . Facial droop 10/12/2012  . Spinal stenosis of cervical region 07/01/2012  . GERD 05/27/2009  . DYSPHAGIA UNSPECIFIED 05/27/2009  . CHANGE IN BOWELS 05/27/2009  . SMOKER 09/09/2008  . HYPERTENSION 09/09/2008  . CVA 09/09/2008  . CONSTIPATION 09/09/2008  . HIGH BLOOD PRESSURE 07/25/2006    Height: Ht Readings from Last 1 Encounters:  10/12/12 5\' 9"  (1.753 m)    Weight: Wt Readings from Last 1 Encounters:  10/12/12 148 lb (67.132 kg)    Ideal Body Weight: 160# (72.7 kg)  % Ideal Body Weight: 93%  Wt Readings from Last 10 Encounters:  10/12/12 148 lb (67.132 kg)  07/01/12 168 lb 10.4 oz (76.5 kg)  07/01/12 168 lb 10.4 oz (76.5 kg)  05/27/09 195 lb (88.451 kg)    Usual Body Weight: 169#  % Usual Body Weight: 88%  BMI:  Body mass index is 21.85  kg/(m^2).normal range  Estimated Nutritional Needs: Kcal: 1610-9604 Protein: 80-94 gr Fluid: 2000 ml/day  Skin: No current issues noted  Diet Order: Cardiac po's 20-50% per pt  EDUCATION NEEDS: -Education needs addressed   Intake/Output Summary (Last 24 hours) at 10/14/12 1208 Last data filed at 10/14/12 0900  Gross per 24 hour  Intake    120 ml  Output      0 ml  Net    120 ml    Last BM: 10/11/12 (addressed by nursing)  Labs:   Recent Labs Lab 10/12/12 1340  NA 135  K 3.8  CL 101  CO2 28  BUN 15  CREATININE 0.98  CALCIUM 9.3  GLUCOSE 120*    CBG (last 3)  No results found for this basename: GLUCAP,  in the last 72 hours  Scheduled Meds: . cholecalciferol  1,000 Units Oral Daily  . clopidogrel  75 mg Oral Q breakfast  . darifenacin  7.5 mg Oral Daily  . enoxaparin (LOVENOX) injection  40 mg Subcutaneous Q24H  . hydrochlorothiazide  25 mg Oral Daily  . lisinopril  10 mg Oral Daily  . metoCLOPramide  10 mg Oral QHS  . multivitamin with minerals  1 tablet Oral Daily  . pantoprazole  40 mg Oral Daily  . potassium chloride SA  20 mEq Oral BID  . sodium chloride  3 mL Intravenous Q12H    Continuous Infusions:   Past Medical History  Diagnosis Date  .  Hypertension   . Stroke     Left side weakness.  . Shortness of breath     with activity  . Sleep apnea     does not use CPAP.  Tested maybe 5- 10 years ago  . GERD (gastroesophageal reflux disease)   . Constipation   . Glaucoma     Past Surgical History  Procedure Laterality Date  . Hernia repair Right     inguinal  . Cervical fusion    . Anterior cervical decomp/discectomy fusion N/A 07/01/2012    Procedure: ANTERIOR CERVICAL DECOMPRESSION/DISCECTOMY FUSION CERVICAL FIVE-SIX [name] REMOVAL CERVICAL SIX-SEVEN;  Surgeon: Temple Pacini, MD;  Location: [facility];  Service: Neurosurgery;  Laterality: N/A;    Royann Shivers MS,RD,LDN,CSG Office: [phone] Pager: [phone]

## 2012-10-14 NOTE — Progress Notes (Signed)
Occupational Therapy Evaluation Patient Details Name: [name] MRN: [id number] DOB: January 29, 1934 Today's Date: 10/14/2012 Time: 4132-4401 OT Time Calculation (min): 25 min 0272-5366 Eval   OT Assessment / Plan / Recommendation History of present illness 78 year old male status post C6-7 anterior cervical discectomy and fusion for treatment of a severe compressive myelopathy. Patient did reasonably well but over the past 8 months has had recurrent neck pain and upper extremity pain. Patient also with increasing difficulty with lower extremity weakness. He is essentially become nonambulatory.  He has been admitted for possible stroke.    Clinical Impression   Pt pesents with generalized weakness bilateral UE and increased complaint of LLE pain and decreased balance.      OT Assessment  Patient does not need any further OT services    Follow Up Recommendations  No OT follow up                Recommendations for Other Services Other (comment) Recommend cont of UE HEP for strengthening   Frequency       Precautions / Restrictions Precautions Precautions: Fall Restrictions Weight Bearing Restrictions: No       ADL  Grooming: Set up;Wash/dry face;Teeth care Where Assessed - Grooming: Supported sitting Upper Body Dressing: Simulated;Supervision/safety Where Assessed - Upper Body Dressing: Supported sitting Lower Body Dressing: Supervision/safety Where Assessed - Lower Body Dressing: Supported sitting      OT Goals(Current goals can be found in the care plan section) Acute Rehab OT Goals Patient Stated Goal: My legs and my balance are what give me more trouble besides whatever this is going on with my face right now OT Goal Formulation: With patient/family Potential to Achieve Goals: Good  Visit Information  History of Present Illness: 78 year old male status post C6-7 anterior cervical discectomy and fusion for treatment of a severe compressive myelopathy. Patient did  reasonably well but over the past 8 months has had recurrent neck pain and upper extremity pain. Patient also with increasing difficulty with lower extremity weakness. He is essentially become nonambulatory.  He has been admitted for possible stroke.        Prior Functioning     Home Living Family/patient expects to be discharged to:: Private residence Living Arrangements: Spouse/significant other Available Help at Discharge: Family;Available 24 hours/day Type of Home: House Home Access: Level entry Home Layout: One level Home Equipment: Wheelchair - manual;Walker - 4 wheels;Grab bars - toilet;Grab bars - tub/shower;Shower seat Additional Comments: Pt reports recent outpatient PT to address UE weakness from cervical surgery. Prior Function Level of Independence: Needs assistance Gait / Transfers Assistance Needed: Reports requiring WC and assistance w/SPT.  ADL's / Homemaking Assistance Needed: minA for bathing/dressing Communication / Swallowing Assistance Needed: no difficulties Communication Communication: No difficulties Dominant Hand: Right         Vision/Perception Vision - History Patient Visual Report: No change from baseline;Other (comment) Vision - Assessment Eye Alignment: Within Functional Limits Additional Comments: Pt states his right eye just feels heavy, no difficulties with vision or tracking    Cognition  Cognition Arousal/Alertness: Awake/alert Behavior During Therapy: WFL for tasks assessed/performed Overall Cognitive Status: Within Functional Limits for tasks assessed    Extremity/Trunk Assessment Upper Extremity Assessment Upper Extremity Assessment: Generalized weakness RUE Deficits / Details: proximal 3/5 LUE Deficits / Details: proximal 3/5 Lower Extremity Assessment Lower Extremity Assessment: Defer to PT evaluation     Mobility Bed Mobility Bed Mobility: Rolling Right Rolling Left: 6: Modified independent (Device/Increase time);With  rail Left Sidelying  to Sit: 6: Modified independent (Device/Increase time);HOB flat;With rails Transfers Transfers: Sit to Stand Sit to Stand: 5: Supervision;With armrests;From bed Stand to Sit: 5: Supervision;To chair/3-in-1;To bed     Exercise General Exercises - Upper Extremity Shoulder Flexion: AROM Shoulder ABduction: AROM Shoulder Horizontal ABduction: AROM Shoulder Horizontal ADduction: AROM Elbow Flexion: AROM Chair Push Up: AROM Other Exercises Other Exercises: Review of UE HEP with wife and patient; encouraged patient to perform exercises with supervision rather than allow his wife to do for him.  verbalized good understanding and with return demo    Balance     End of Session OT - End of Session Equipment Utilized During Treatment: Gait belt Activity Tolerance: Patient tolerated treatment well;Patient limited by fatigue;Patient limited by pain Patient left: in chair;with call bell/phone within reach;with family/visitor present  GO     Velora Mediate 10/14/2012, 1:46 PM

## 2012-10-14 NOTE — Progress Notes (Signed)
UR Chart Review Completed  

## 2012-10-14 NOTE — Care Management Note (Unsigned)
    Page 1 of 1   10/14/2012     1:25:35 PM   CARE MANAGEMENT NOTE 10/14/2012  Patient:  JESSICA, SEIDMAN   Account Number:  [id number]  Date Initiated:  10/14/2012  Documentation initiated by:  Rosemary Holms  Subjective/Objective Assessment:   Pt admitted from home where he lives with spouse. Per pt recently had cervical spine surgery and has been going to Outpt PT at [facility] in [place] working on all upper body. No assistance with legs.     Action/Plan:   Anticipated DC Date:     Anticipated DC Plan:        DC Planning Services  CM consult      Choice offered to / List presented to:             Status of service:  In process, will continue to follow Medicare Important Message given?   (If response is "NO", the following Medicare IM given date fields will be blank) Date Medicare IM given:   Date Additional Medicare IM given:    Discharge Disposition:    Per UR Regulation:    If discussed at Long Length of Stay Meetings, dates discussed:    Comments:  10/14/12 Rosemary Holms RN BSN CM

## 2012-10-14 NOTE — Progress Notes (Signed)
[facility]   2D echo completed 10/14/2012.   Cindy Chelsey Kimberley, RDCS  

## 2012-10-14 NOTE — Progress Notes (Signed)
[name] RUE:[id number] DOB: [birth year] DOA: 10/12/2012 PCP: Milana Obey, MD   Subjective: This very pleasant 78 year old man, hypertensive, history of previous stroke presents with a right facial droop for the last 2 days now which has not resolved. MRI brain scan does not show any new CVA. Neurology consultation is pending.           Physical Exam: Blood pressure 124/71, pulse 76, temperature 98.4 F (36.9 C), temperature source Oral, resp. rate 19, height 5\' 9"  (1.753 m), weight 67.132 kg (148 lb), SpO2 99.00%. He looks systemically well. He still has a right facial droop, upper motor neuron lesion, consistent with CVA. Heart sounds are present and normal without murmurs. Lung fields are clear. There are no carotid bruits. He is alert and orientated. There is no limb weakness.   Investigations:  No results found for this or any previous visit (from the past 240 hour(s)).   Basic Metabolic Panel:  Recent Labs  14/78/29 1340  NA 135  K 3.8  CL 101  CO2 28  GLUCOSE 120*  BUN 15  CREATININE 0.98  CALCIUM 9.3      CBC:  Recent Labs  10/12/12 1340  WBC 7.3  HGB 12.7*  HCT 37.8*  MCV 90.2  PLT 197    Dg Chest 1 View  10/13/2012   *RADIOLOGY REPORT*  Clinical Data: Weakness.  CHEST - 1 VIEW  Comparison: Chest radiograph performed 06/30/2012  Findings: The lungs are hypoexpanded.  Left basilar airspace opacity raises concern for mild pneumonia.  Vascular crowding and vascular congestion are seen.  No definite pleural effusion or pneumothorax is identified.  The cardiomediastinal silhouette is within normal limits.  No acute osseous abnormalities are seen.  IMPRESSION:  1.  Lungs hypoexpanded.  Left basilar airspace opacity raises concern for mild pneumonia. 2.  Vascular congestion noted.   Original Report Authenticated By: Tonia Ghent, M.D.   Mr [facility] Wo Contrast  10/14/2012   CLINICAL DATA:  78 year old male with headache and weakness.  EXAM:  MRA HEAD WITHOUT CONTRAST  TECHNIQUE: MRA HEAD WITHOUT CONTRAST  COMPARISON:  Brain MRI from the same day reported separately.  FINDINGS: Antegrade flow in the posterior circulation with codominant distal vertebral arteries. No distal vertebral artery stenosis. Patent vertebrobasilar junction. PICA and right AICA vessels largely not visible. No basilar stenosis. SCA and PCA origins are normal. Left posterior communicating artery is present and normal, the right is diminutive or absent. Bilateral PCA branches are within normal limits.  Antegrade flow in both ICA siphons. No ICA stenosis. Ophthalmic and left posterior communicating artery origins are normal. Normal carotid termini, MCA and ACA origins.  Tortuous proximal ACAs. Anterior communicating artery within normal limits. Otherwise negative visualized ACA branches. Visualized bilateral MCA branches are within normal limits.  IMPRESSION: Negative intracranial MRA.   Electronically Signed   By: Augusto Gamble M.D.   On: 10/14/2012 12:19   Mr Brain Wo Contrast  10/14/2012   CLINICAL DATA:  78 year old male with headache and weakness.  EXAM: MRI HEAD WITHOUT CONTRAST  TECHNIQUE: Multiplanar, multisequence MR imaging was performed. No intravenous contrast was administered.  COMPARISON:  Brain MRI 11/19/2008.  FINDINGS: Widespread chronic bilateral cerebellar infarcts, more so on the right. Involvement of the right lateral brainstem (right PICA territory). Remaining brainstem appears stable and spared.  Mild T2 heterogeneity in the thalami appears increased. Patchy in confluent mostly posterior periventricular white matter T2 and FLAIR hyperintensity appears stable. No supratentorial cortical encephalomalacia identified.  Overall, brain volume is stable since 2010.  No restricted diffusion to suggest acute infarction. No midline shift, mass effect, evidence of mass lesion, ventriculomegaly, extra-axial collection or acute intracranial hemorrhage. Cervicomedullary  junction and pituitary are within normal limits. Negative visualized cervical spine. Major intracranial vascular flow voids are stable.  Stable orbits soft tissues. Visualized paranasal sinuses and mastoids are clear. Negative visualized internal auditory structures. Normal bone marrow signal. Negative scalp soft tissues.  IMPRESSION: No acute intracranial abnormality. Stable since 2010. Advanced chronic ischemic disease, mostly in the cerebellum.   Electronically Signed   By: Augusto Gamble M.D.   On: 10/14/2012 12:23   US Carotid Duplex Bilateral  10/13/2012   CLINICAL DATA:  Cerebrovascular accident.  EXAM: BILATERAL CAROTID DUPLEX ULTRASOUND  TECHNIQUE: Wallace Cullens scale imaging, color Doppler and duplex ultrasound were performed of bilateral carotid and vertebral arteries in the neck.  COMPARISON:  None.  FINDINGS: Criteria: Quantification of carotid stenosis is based on velocity parameters that correlate the residual internal carotid diameter with NASCET-based stenosis levels, using the diameter of the distal internal carotid lumen as the denominator for stenosis measurement.  The following velocity measurements were obtained:  RIGHT  ICA:  121/29 cm/sec  CCA:  134/20 cm/sec  SYSTOLIC ICA/CCA RATIO:  0.91  DIASTOLIC ICA/CCA RATIO:  1.45  ECA:  114 cm/sec  LEFT  ICA:  138/22 cm/sec  CCA:  108/13 cm/sec  SYSTOLIC ICA/CCA RATIO:  1.28  DIASTOLIC ICA/CCA RATIO:  1.62  ECA:  87 cm/sec  RIGHT CAROTID ARTERY: Minimal atheromatous disease is seen involving the distal right common carotid artery and proximal right internal carotid artery, which is consistent with less than 50% diameter stenosis based on Doppler criteria.  RIGHT VERTEBRAL ARTERY:  Antegrade flow is noted.  LEFT CAROTID ARTERY: Focal partially calcified plaque is noted in the mid portion of the left common carotid artery which is moderate in size. Moderate irregular partially calcified plaque is noted in the left carotid bulb and extending into the proximal left  internal carotid artery. This is consistent with 50-69% stenosis based on Doppler criteria.  LEFT VERTEBRAL ARTERY:  Antegrade flow is noted.  IMPRESSION: Minimal plaque formation is seen involving the proximal right internal carotid artery consistent with less than 50% diameter stenosis based on ultrasound criteria. Moderate size partially calcified plaque is noted in the left carotid bulb and extending into proximal left internal carotid artery consistent with 50-69% stenosis based on Doppler criteria.   Electronically Signed   By: Roque Lias M.D.   On: 10/13/2012 14:53      Medications: I have reviewed the patient's current medications.  Impression: 1. New CVA clinically. MRI is actually negative. 2. Hypertension.     Plan: 1. Await neurology consultation for further recommendations. Patient has been started on Plavix, which I think is appropriate. Blood pressure is well-controlled at the present time. Lipid panel is unremarkable and I do not think requires any statin therapy at the present time.  Consultants:  Neurology consultation pending.   Procedures:  Echocardiogram.   Antibiotics:  None.                   Code Status: Full code.  Family Communication: Discuss plan with patient at the bedside.   Disposition Plan: Home when medically stable, possibly in the next 24 hours.  Time spent: 20 minutes.   LOS: 2 days   Wilson Singer Pager [phone]  10/14/2012, 2:17 PM

## 2012-10-14 NOTE — Consult Note (Signed)
[facility] Brylee Mcgreal A. Gerilyn Pilgrim, MD     [email or web address]          [name] is an 78 y.o. male.   ASSESSMENT/PLAN:  1. Right facial pain and swelling of unclear etiology. The patient is unlikely to have a stroke. Potential etiologies includes inflammatory processes, infection or tumors. Consider additional imaging of the head and neck. A CT scan with and without IV contrast may be more sensitive. The patient apparently did have an MRI. This be followed. 2. Moderate to severe left hip pain of unclear etiology. Consider imaging. Also consider sedimentation rate.     The patient presents with a 3 day history of right facial swelling and pain. The patient denies dysarthria. He denies focal weakness. He decided to seek medical attention when he developed significant facial asymmetry on the right side. Again, there is no focal numbness or weakness. There is no gait instability. No dyspnea, chest pain, GI GU symptoms. The patient does complain of having significant left hip pain which radiates down the left lower extremity. The left hip pain has been present for about a month or so. It's unclear what the workup he has had for this. The review of systems otherwise negative.  GENERAL: This a very pleasant man in no acute distress.  HEENT: Supple. There is significant swelling of the right cheek area associated with marked soreness on palpation. There is no swelling or breaking of the skin. The oropharynx is normal without evidence of infection.  ABDOMEN: soft  EXTREMITIES: No edema. The patient has slightly reduced range of motion of the left hip due to pain. Pain is most prominent on flexion of the hip and less so on external rotation or abduction.   BACK: Normal.  SKIN: Normal by inspection.    MENTAL STATUS: Alert and oriented. Speech, language and cognition are generally intact. Judgment and insight normal.   CRANIAL NERVES: Pupils are equal, round and reactive to light  and accommodation; extra ocular movements are full, there is no significant nystagmus; visual fields are full; upper and lower facial muscles are normal in strength and symmetric, there is no flattening of the nasolabial folds; tongue is midline; uvula is midline; shoulder elevation is normal.  MOTOR: Normal tone, bulk and strength in the upper extremities; no pronator drift. The patient has left hip weakness 4/5 on flexion and and the abduction. Dorsiflexion 5. The right side is normal.  COORDINATION: Left finger to nose is normal, right finger to nose is normal, No rest tremor; no intention tremor; no postural tremor; no bradykinesia.  REFLEXES: Deep tendon reflexes are symmetrical and normal. Babinski reflexes are flexor bilaterally.   SENSATION: Normal to light touch.   Past Medical History  Diagnosis Date  . Hypertension   . Stroke     Left side weakness.  . Shortness of breath     with activity  . Sleep apnea     does not use CPAP.  Tested maybe 5- 10 years ago  . GERD (gastroesophageal reflux disease)   . Constipation   . Glaucoma     Past Surgical History  Procedure Laterality Date  . Hernia repair Right     inguinal  . Cervical fusion    . Anterior cervical decomp/discectomy fusion N/A 07/01/2012    Procedure: ANTERIOR CERVICAL DECOMPRESSION/DISCECTOMY FUSION CERVICAL FIVE-SIX [name] REMOVAL CERVICAL SIX-SEVEN;  Surgeon: Temple Pacini, MD;  Location: [facility];  Service: Neurosurgery;  Laterality: N/A;    History reviewed. No  pertinent family history.  Social History:  reports that he has been smoking.  He does not have any smokeless tobacco history on file. He reports that he does not drink alcohol or use illicit drugs.  Allergies:  Allergies  Allergen Reactions  . Aspirin     REACTION: vomiting, tolerates 81mg  daily    Medications: Prior to Admission medications   Medication Sig Start Date End Date Taking? Authorizing Provider  aspirin EC 81 MG tablet  Take 81 mg by mouth daily.   Yes Historical Provider, MD  cholecalciferol (VITAMIN D) 1000 UNITS tablet Take 1,000 Units by mouth daily.   Yes Historical Provider, MD  fluticasone (FLONASE) 50 MCG/ACT nasal spray Place 2 sprays into the nose daily as needed for rhinitis.   Yes Historical Provider, MD  hydrochlorothiazide (HYDRODIURIL) 25 MG tablet Take 25 mg by mouth daily.   Yes Historical Provider, MD  lisinopril (PRINIVIL,ZESTRIL) 10 MG tablet Take 10 mg by mouth daily.   Yes Historical Provider, MD  metoCLOPramide (REGLAN) 10 MG tablet Take 10 mg by mouth at bedtime.   Yes Historical Provider, MD  Multiple Vitamin (MULTIVITAMIN WITH MINERALS) TABS Take 1 tablet by mouth daily.   Yes Historical Provider, MD  omeprazole (PRILOSEC) 20 MG capsule Take 20 mg by mouth daily.   Yes Historical Provider, MD  polyethylene glycol (MIRALAX / GLYCOLAX) packet Take 17 g by mouth as needed.   Yes Historical Provider, MD  potassium chloride SA (K-DUR,KLOR-CON) 20 MEQ tablet Take 20 mEq by mouth 2 (two) times daily.   Yes Historical Provider, MD  solifenacin (VESICARE) 5 MG tablet Take 5 mg by mouth daily.   Yes Historical Provider, MD  nitroGLYCERIN (NITROSTAT) 0.4 MG SL tablet Place 0.4 mg under the tongue every 5 (five) minutes as needed for chest pain.    Historical Provider, MD    Scheduled Meds: . cholecalciferol  1,000 Units Oral Daily  . clopidogrel  75 mg Oral Q breakfast  . darifenacin  7.5 mg Oral Daily  . enoxaparin (LOVENOX) injection  40 mg Subcutaneous Q24H  . hydrochlorothiazide  25 mg Oral Daily  . lisinopril  10 mg Oral Daily  . metoCLOPramide  10 mg Oral QHS  . multivitamin with minerals  1 tablet Oral Daily  . pantoprazole  40 mg Oral Daily  . potassium chloride SA  20 mEq Oral BID  . sodium chloride  3 mL Intravenous Q12H   Continuous Infusions:  PRN Meds:.sodium chloride, acetaminophen, acetaminophen, fluticasone, HYDROcodone-acetaminophen, nitroGLYCERIN, ondansetron (ZOFRAN) IV,  polyethylene glycol, senna-docusate, sodium chloride    Blood pressure 136/56, pulse 82, temperature 97.9 F (36.6 C), temperature source Oral, resp. rate 18, height 5\' 9"  (1.753 m), weight 67.132 kg (148 lb), SpO2 97.00%.   Results for orders placed during the hospital encounter of 10/12/12 (from the past 48 hour(s))  CBC     Status: Abnormal   Collection Time    10/12/12  1:40 PM      Result Value Range   WBC 7.3  4.0 - 10.5 K/uL   RBC 4.19 (*) 4.22 - 5.81 MIL/uL   Hemoglobin 12.7 (*) 13.0 - 17.0 g/dL   HCT 16.1 (*) 09.6 - 04.5 %   MCV 90.2  78.0 - 100.0 fL   MCH 30.3  26.0 - 34.0 pg   MCHC 33.6  30.0 - 36.0 g/dL   RDW 40.9  81.1 - 91.4 %   Platelets 197  150 - 400 K/uL  BASIC METABOLIC PANEL  Status: Abnormal   Collection Time    10/12/12  1:40 PM      Result Value Range   Sodium 135  135 - 145 mEq/L   Potassium 3.8  3.5 - 5.1 mEq/L   Chloride 101  96 - 112 mEq/L   CO2 28  19 - 32 mEq/L   Glucose, Bld 120 (*) 70 - 99 mg/dL   BUN 15  6 - 23 mg/dL   Creatinine, Ser 4.54  0.50 - 1.35 mg/dL   Calcium 9.3  8.4 - 09.8 mg/dL   GFR calc non Af Amer 78 (*) >90 mL/min   GFR calc Af Amer >90  >90 mL/min   Comment: (NOTE)     The eGFR has been calculated using the CKD EPI equation.     This calculation has not been validated in all clinical situations.     eGFR's persistently <90 mL/min signify possible Chronic Kidney     Disease.  URINALYSIS, ROUTINE W REFLEX MICROSCOPIC     Status: Abnormal   Collection Time    10/12/12  1:45 PM      Result Value Range   Color, Urine YELLOW  YELLOW   APPearance CLEAR  CLEAR   Specific Gravity, Urine 1.020  1.005 - 1.030   pH 7.0  5.0 - 8.0   Glucose, UA NEGATIVE  NEGATIVE mg/dL   Hgb urine dipstick NEGATIVE  NEGATIVE   Bilirubin Urine NEGATIVE  NEGATIVE   Ketones, ur NEGATIVE  NEGATIVE mg/dL   Protein, ur NEGATIVE  NEGATIVE mg/dL   Urobilinogen, UA 2.0 (*) 0.0 - 1.0 mg/dL   Nitrite NEGATIVE  NEGATIVE   Leukocytes, UA NEGATIVE   NEGATIVE   Comment: MICROSCOPIC NOT DONE ON URINES WITH NEGATIVE PROTEIN, BLOOD, LEUKOCYTES, NITRITE, OR GLUCOSE <1000 mg/dL.  URINE RAPID DRUG SCREEN (HOSP PERFORMED)     Status: None   Collection Time    10/12/12  1:45 PM      Result Value Range   Opiates NONE DETECTED  NONE DETECTED   Cocaine NONE DETECTED  NONE DETECTED   Benzodiazepines NONE DETECTED  NONE DETECTED   Amphetamines NONE DETECTED  NONE DETECTED   Tetrahydrocannabinol NONE DETECTED  NONE DETECTED   Barbiturates NONE DETECTED  NONE DETECTED   Comment:            DRUG SCREEN FOR MEDICAL PURPOSES     ONLY.  IF CONFIRMATION IS NEEDED     FOR ANY PURPOSE, NOTIFY LAB     WITHIN 5 DAYS.                LOWEST DETECTABLE LIMITS     FOR URINE DRUG SCREEN     Drug Class       Cutoff (ng/mL)     Amphetamine      1000     Barbiturate      200     Benzodiazepine   200     Tricyclics       300     Opiates          300     Cocaine          300     THC              50  HEMOGLOBIN A1C     Status: Abnormal   Collection Time    10/13/12  4:50 AM      Result Value Range   Hemoglobin A1C 6.3 (*) <5.7 %  Comment: (NOTE)                                                                               According to the ADA Clinical Practice Recommendations for 2011, when     HbA1c is used as a screening test:      >=6.5%   Diagnostic of Diabetes Mellitus               (if abnormal result is confirmed)     5.7-6.4%   Increased risk of developing Diabetes Mellitus     References:Diagnosis and Classification of Diabetes Mellitus,Diabetes     Care,2011,34(Suppl 1):S62-S69 and Standards of Medical Care in             Diabetes - 2011,Diabetes Care,2011,34 (Suppl 1):S11-S61.   Mean Plasma Glucose 134 (*) <117 mg/dL   Comment: Performed at [facility]  LIPID PANEL     Status: None   Collection Time    10/13/12  4:50 AM      Result Value Range   Cholesterol 136  0 - 200 mg/dL   Triglycerides 95  <409 mg/dL   HDL 40  >81 mg/dL    Total CHOL/HDL Ratio 3.4     VLDL 19  0 - 40 mg/dL   LDL Cholesterol 77  0 - 99 mg/dL   Comment:            Total Cholesterol/HDL:CHD Risk     Coronary Heart Disease Risk Table                         Men   Women      1/2 Average Risk   3.4   3.3      Average Risk       5.0   4.4      2 X Average Risk   9.6   7.1      3 X Average Risk  23.4   11.0                Use the calculated Patient Ratio     above and the CHD Risk Table     to determine the patient's CHD Risk.                ATP III CLASSIFICATION (LDL):      <100     mg/dL   Optimal      191-478  mg/dL   Near or Above                        Optimal      130-159  mg/dL   Borderline      295-621  mg/dL   High      >308     mg/dL   Very High    Dg Chest 1 View  10/13/2012   *RADIOLOGY REPORT*  Clinical Data: Weakness.  CHEST - 1 VIEW  Comparison: Chest radiograph performed 06/30/2012  Findings: The lungs are hypoexpanded.  Left basilar airspace opacity raises concern for mild pneumonia.  Vascular crowding and vascular congestion are seen.  No definite pleural effusion or  pneumothorax is identified.  The cardiomediastinal silhouette is within normal limits.  No acute osseous abnormalities are seen.  IMPRESSION:  1.  Lungs hypoexpanded.  Left basilar airspace opacity raises concern for mild pneumonia. 2.  Vascular congestion noted.   Original Report Authenticated By: Tonia Ghent, M.D.   Ct Head Wo Contrast  10/12/2012   CLINICAL DATA:  Patient with left side of mouth drawn. History of hypertension and stroke.  EXAM: CT HEAD WITHOUT CONTRAST  TECHNIQUE: Contiguous axial images were obtained from the base of the skull through the vertex without intravenous contrast.  COMPARISON:  09/08/2008.  FINDINGS: The ventricles are normal in size, for this patient's age, and normal configuration. There is sulcal enlargement reflecting volume loss, greatest from the cerebellum.  There are no parenchymal masses or mass effect. There is an old  right inferior cerebellar infarct. Mild white matter hypoattenuation is noted most consistent with chronic microvascular ischemic change. There is no evidence of a recent cortical infarct.  There are no extra-axial masses or abnormal fluid collections.  There is no intracranial hemorrhage.  The visualized sinuses and mastoid air cells are clear.  IMPRESSION: 1. No acute intracranial abnormalities. 2. Age related volume loss. 3. Old right cerebellar infarct. Mild chronic microvascular ischemic change.   Electronically Signed   By: Amie Portland M.D.   On: 10/12/2012 14:37   US Carotid Duplex Bilateral  10/13/2012   CLINICAL DATA:  Cerebrovascular accident.  EXAM: BILATERAL CAROTID DUPLEX ULTRASOUND  TECHNIQUE: Wallace Cullens scale imaging, color Doppler and duplex ultrasound were performed of bilateral carotid and vertebral arteries in the neck.  COMPARISON:  None.  FINDINGS: Criteria: Quantification of carotid stenosis is based on velocity parameters that correlate the residual internal carotid diameter with NASCET-based stenosis levels, using the diameter of the distal internal carotid lumen as the denominator for stenosis measurement.  The following velocity measurements were obtained:  RIGHT  ICA:  121/29 cm/sec  CCA:  134/20 cm/sec  SYSTOLIC ICA/CCA RATIO:  0.91  DIASTOLIC ICA/CCA RATIO:  1.45  ECA:  114 cm/sec  LEFT  ICA:  138/22 cm/sec  CCA:  108/13 cm/sec  SYSTOLIC ICA/CCA RATIO:  1.28  DIASTOLIC ICA/CCA RATIO:  1.62  ECA:  87 cm/sec  RIGHT CAROTID ARTERY: Minimal atheromatous disease is seen involving the distal right common carotid artery and proximal right internal carotid artery, which is consistent with less than 50% diameter stenosis based on Doppler criteria.  RIGHT VERTEBRAL ARTERY:  Antegrade flow is noted.  LEFT CAROTID ARTERY: Focal partially calcified plaque is noted in the mid portion of the left common carotid artery which is moderate in size. Moderate irregular partially calcified plaque is noted in  the left carotid bulb and extending into the proximal left internal carotid artery. This is consistent with 50-69% stenosis based on Doppler criteria.  LEFT VERTEBRAL ARTERY:  Antegrade flow is noted.  IMPRESSION: Minimal plaque formation is seen involving the proximal right internal carotid artery consistent with less than 50% diameter stenosis based on ultrasound criteria. Moderate size partially calcified plaque is noted in the left carotid bulb and extending into proximal left internal carotid artery consistent with 50-69% stenosis based on Doppler criteria.   Electronically Signed   By: Roque Lias M.D.   On: 10/13/2012 14:53        Haly Feher A. Gerilyn Pilgrim, M.D.  Diplomate, Biomedical engineer of Psychiatry and Neurology ( Neurology). 10/14/2012, 8:41 AM

## 2012-10-14 NOTE — Progress Notes (Signed)
SLP Cancellation Note  Patient Details Name: [name] MRN: [id number] DOB: Oct 07, 1934   Cancelled treatment:       Reason Eval/Treat Not Completed: SLP screened, no needs identified, will sign off. SLP spoke with pt and wife and both deny changes in swallowing, speech, memory, or language this visit. Pt does have facial swelling on the right side which impairs buccal movement and pt reports that it feels sore. CN VII appears intact and no lingual deviation present.  Thank you,  Havery Moros, CCC-SLP [phone]    [name] 10/14/2012, 4:55 PM

## 2012-10-15 ENCOUNTER — Encounter (HOSPITAL_COMMUNITY): Payer: Self-pay | Admitting: Family Medicine

## 2012-10-15 ENCOUNTER — Inpatient Hospital Stay (HOSPITAL_COMMUNITY): Payer: PRIVATE HEALTH INSURANCE

## 2012-10-15 LAB — VITAMIN B12: Vitamin B-12: 378 pg/mL (ref 211–911)

## 2012-10-15 MED ORDER — AMOXICILLIN-POT CLAVULANATE 875-125 MG PO TABS: 1.0000 | ORAL_TABLET | Freq: Two times a day (BID) | ORAL | Status: DC

## 2012-10-15 NOTE — Progress Notes (Signed)
Patient ID: [name], male   DOB: 01/06/1934, 78 y.o.   MRN: [id number]  [facility] [name] A. Gerilyn Pilgrim, MD     [email or web address]          [name] is an 78 y.o. male.   Assessment/Plan: R facial mass ---infect versus tumor. FACIAL CT W contrast.   L hip pain.   Xray   In addition to the imaging studies above, sedimentation rate ANA and additional labs to be done.  The patient continues to complain of right facial swelling. He does not report focal neurological deficits, dysarthria or dysphagia. He also has pain right facial region.  The patient is awake and alert. He is lucid and coherent. He continues to have significant right facial swelling. The skin is intact there is no evidence of redness or erythema. No evidence of infection. He has a right exotropia and the left pupil is dilated and the minimal reactive. He has good strength in the extremities.  The patient's MRI is reviewed in person. The MRA is essentially unremarkable. There is no brain lesions indicating a stroke. There is swelling of the right facial region which seems most consistent with a mass as there is no evidence of edema. The case is reviewed with the Dr. Tyron Vaughan and he recommends a facial CT with contrast.   Objective: Vital signs in last 24 hours: Temp:  [98.1 F (36.7 C)-99 F (37.2 C)] 99 F (37.2 C) (10/14 0624) Pulse Rate:  [76-93] 93 (10/14 0624) Resp:  [18-20] 18 (10/14 0624) BP: (99-124)/(55-78) 115/55 mmHg (10/14 0624) SpO2:  [97 %-99 %] 97 % (10/14 0624)  Intake/Output from previous day: 10/13 0701 - 10/14 0700 In: 520 [P.O.:520] Out: -  Intake/Output this shift:   Nutritional status: Cardiac   Lab Results: No results found for this or any previous visit (from the past 48 hour(s)).  Lipid Panel  Recent Labs  10/13/12 0450  CHOL 136  TRIG 95  HDL 40  CHOLHDL 3.4  VLDL 19  LDLCALC 77    Studies/Results: [name] Wo Contrast  10/14/2012   CLINICAL DATA:   78 year old male with headache and weakness.  EXAM: MRA HEAD WITHOUT CONTRAST  TECHNIQUE: MRA HEAD WITHOUT CONTRAST  COMPARISON:  Brain MRI from the same day reported separately.  FINDINGS: Antegrade flow in the posterior circulation with codominant distal vertebral arteries. No distal vertebral artery stenosis. Patent vertebrobasilar junction. PICA and right AICA vessels largely not visible. No basilar stenosis. SCA and PCA origins are normal. Left posterior communicating artery is present and normal, the right is diminutive or absent. Bilateral PCA branches are within normal limits.  Antegrade flow in both ICA siphons. No ICA stenosis. Ophthalmic and left posterior communicating artery origins are normal. Normal carotid termini, MCA and ACA origins.  Tortuous proximal ACAs. Anterior communicating artery within normal limits. Otherwise negative visualized ACA branches. Visualized bilateral MCA branches are within normal limits.  IMPRESSION: Negative intracranial MRA.   Electronically Signed   By: [name] M.D.   On: 10/14/2012 12:19   Mr Brain Wo Contrast  10/14/2012   CLINICAL DATA:  78 year old male with headache and weakness.  EXAM: MRI HEAD WITHOUT CONTRAST  TECHNIQUE: Multiplanar, multisequence MR imaging was performed. No intravenous contrast was administered.  COMPARISON:  Brain MRI 11/19/2008.  FINDINGS: Widespread chronic bilateral cerebellar infarcts, more so on the right. Involvement of the right lateral brainstem (right PICA territory). Remaining brainstem appears stable and spared.  Mild T2 heterogeneity in  the thalami appears increased. Patchy in confluent mostly posterior periventricular white matter T2 and FLAIR hyperintensity appears stable. No supratentorial cortical encephalomalacia identified. Overall, brain volume is stable since 2010.  No restricted diffusion to suggest acute infarction. No midline shift, mass effect, evidence of mass lesion, ventriculomegaly, extra-axial collection or  acute intracranial hemorrhage. Cervicomedullary junction and pituitary are within normal limits. Negative visualized cervical spine. Major intracranial vascular flow voids are stable.  Stable orbits soft tissues. Visualized paranasal sinuses and mastoids are clear. Negative visualized internal auditory structures. Normal bone marrow signal. Negative scalp soft tissues.  IMPRESSION: No acute intracranial abnormality. Stable since 2010. Advanced chronic ischemic disease, mostly in the cerebellum.   Electronically Signed   By: [name] M.D.   On: 10/14/2012 12:23   US Carotid Duplex Bilateral  10/13/2012   CLINICAL DATA:  Cerebrovascular accident.  EXAM: BILATERAL CAROTID DUPLEX ULTRASOUND  TECHNIQUE: [name] scale imaging, color Doppler and duplex ultrasound were performed of bilateral carotid and vertebral arteries in the neck.  COMPARISON:  None.  FINDINGS: Criteria: Quantification of carotid stenosis is based on velocity parameters that correlate the residual internal carotid diameter with NASCET-based stenosis levels, using the diameter of the distal internal carotid lumen as the denominator for stenosis measurement.  The following velocity measurements were obtained:  RIGHT  ICA:  121/29 cm/sec  CCA:  134/20 cm/sec  SYSTOLIC ICA/CCA RATIO:  0.91  DIASTOLIC ICA/CCA RATIO:  1.45  ECA:  114 cm/sec  LEFT  ICA:  138/22 cm/sec  CCA:  108/13 cm/sec  SYSTOLIC ICA/CCA RATIO:  1.28  DIASTOLIC ICA/CCA RATIO:  1.62  ECA:  87 cm/sec  RIGHT CAROTID ARTERY: Minimal atheromatous disease is seen involving the distal right common carotid artery and proximal right internal carotid artery, which is consistent with less than 50% diameter stenosis based on Doppler criteria.  RIGHT VERTEBRAL ARTERY:  Antegrade flow is noted.  LEFT CAROTID ARTERY: Focal partially calcified plaque is noted in the mid portion of the left common carotid artery which is moderate in size. Moderate irregular partially calcified plaque is noted in the left  carotid bulb and extending into the proximal left internal carotid artery. This is consistent with 50-69% stenosis based on Doppler criteria.  LEFT VERTEBRAL ARTERY:  Antegrade flow is noted.  IMPRESSION: Minimal plaque formation is seen involving the proximal right internal carotid artery consistent with less than 50% diameter stenosis based on ultrasound criteria. Moderate size partially calcified plaque is noted in the left carotid bulb and extending into proximal left internal carotid artery consistent with 50-69% stenosis based on Doppler criteria.   Electronically Signed   By: Roque Lias M.D.   On: 10/13/2012 14:53    Medications:  Scheduled Meds: . cholecalciferol  1,000 Units Oral Daily  . clopidogrel  75 mg Oral Q breakfast  . darifenacin  7.5 mg Oral Daily  . enoxaparin (LOVENOX) injection  40 mg Subcutaneous Q24H  . feeding supplement (ENSURE COMPLETE)  237 mL Oral BID BM  . hydrochlorothiazide  25 mg Oral Daily  . lisinopril  10 mg Oral Daily  . metoCLOPramide  10 mg Oral QHS  . multivitamin with minerals  1 tablet Oral Daily  . pantoprazole  40 mg Oral Daily  . potassium chloride SA  20 mEq Oral BID  . sodium chloride  3 mL Intravenous Q12H   Continuous Infusions:  PRN Meds:.sodium chloride, acetaminophen, acetaminophen, fluticasone, HYDROcodone-acetaminophen, nitroGLYCERIN, ondansetron (ZOFRAN) IV, polyethylene glycol, senna-docusate, sodium chloride  LOS: 3 days   Chibuike Fleek A. Merlene Laughter, M.D.  Diplomate, Tax adviser of Psychiatry and Neurology ( Neurology).

## 2012-10-15 NOTE — Progress Notes (Signed)
Pt is to be discharged home today. Pt is in NAD, IV is out, all paperwork has been reviewed/discussed with patient, and there are no questions/concerns at this time. Assessment is unchanged from this morning. Pt is to be accompanied downstairs by staff and family via wheelchair.  

## 2012-10-15 NOTE — Progress Notes (Signed)
PT Cancellation Note  Patient Details Name: [name] MRN: [id number] DOB: [birth year]   Cancelled Treatment:    Reason Eval/Treat Not Completed: Patient at procedure or test/unavailable; 1st attempt patient was out of room in Xray; 2nd attempt patient refused due to being tired and wanting to rest.   Annalaya Wile ATKINSO 10/15/2012, 12:36 PM

## 2012-10-16 LAB — ANA: Anti Nuclear Antibody(ANA): NEGATIVE

## 2012-10-16 NOTE — Discharge Summary (Signed)
NAMEMarland Kitchen  [name], [name] NO.:  [id number]  MEDICAL RECORD NO.:  [id number]  LOCATION:  A341                          FACILITY:  [facility]  PHYSICIAN:  Mila Homer. Sudie Bailey, M.D.DATE OF BIRTH:  [birth year]  DATE OF ADMISSION:  10/12/2012 DATE OF DISCHARGE:  10/14/2014LH                              DISCHARGE SUMMARY   This 78 year old was admitted to the hospital with a presumptive CVA affecting his right cheek and right side of the face.  He developed a facial droop.  Apparently, unbeknownst to the admitting doctor, he also had developed soreness there as well.  These symptoms had started several days before admission.  He does have a history of a CVA affecting the left side.  He had a benign 4-day hospitalization extending from October 12, 2012, to October 15, 2012.  His vital signs were stable.  White cell count was 7300, hemoglobin 12.7.  His BMP was essentially normal, as was the lipid profile.  His A1c was 6.3 and glucose 120.  CT of the head without contrast showed an old right cerebellar infarct and mild chronic microvascular ischemic change.  MRI of the brain showed no acute abnormalities and stable since 2010, advanced chronic ischemic disease mainly in cerebellum.  There was no acute infarct seen.  His admission chest x-ray showed that his lungs to be hyperexpanded and vascular congestion.  His hip x-ray showed mild degenerative arthropathy in both hips.  Carotid ultrasound showed minimal plaque formation in the proximal right internal carotid artery felt to be less than 50% stenosis, and on the left side is moderate-sized partially calcified plaques consistent with 50-69% stenosis.  The 2D echocardiogram showed an ejection fraction 65-70% and mild LVH. The valve was seen normal.  A 12-lead EKG showed sinus rhythm with first-degree AV block.  The PR interval of 254.  There is no change in EKG of January 2014.  He was admitted to the hospital and put  on a feeding supplementation and RPR, ANA, and sed rate, B12 level pending.  He was put on IV fluids.  He was continued on vitamin D, fluticasone nasal spray, HCTZ, lisinopril, metoclopramide, multivitamins, Enablex, and then started on Plavix 75 mg daily as well.  Neurochecks q.2 hours, 12 hours, then every 4 hours.  Workup proceeded.  During this time apparently the swelling and pain got worse in the right face.  On the day of discharge, he still had a facial droop which seemed to be secondary to a cellulitis of the right face.  He also was complaining of left hip pain radiating down the lateral aspect of the left leg down into the knee, which had been going on for weeks at least.  He had no pain on palpation of the left greater trochanter and his x-rays were consistent with DJD of the hips.  However, the diagnosis of sciatica was also entertained.  He was discharged home on amoxicillin/clavulanic acid 875, 125 b.i.d. for 10 days.  He is to continue his other medications including aspirin 81 mg daily, cholecalciferol 1000 international units daily, fluticasone 15 nasal spray 2 sprays in each nostril daily, HCTZ 25 mg daily, lisinopril 10 mg  daily, metoclopramide 10 mg at bedtime, multivitamin and minerals daily, Omeprazole 20 mg daily, potassium chloride 20 mEq b.i.d., VESIcare 5 mg daily, polyethylene glycol 17 g in water or juice daily, nitroglycerin 0.4 mg sublingually p.r.n.  FINAL DISCHARGE DIAGNOSES: 1. Cellulitis of the right face. 2. Status post old cerebrovascular accident. 3. Benign essential hypertension. 4. Reflux esophagitis. 5. Carious teeth.  He will have followup in the office in 3 days.  I have discontinued the CT of the face that was ordered but this can be done over the next week to 10 days if the problem persists.     Mila Homer. Sudie Bailey, M.D.     SDK/MEDQ  D:  10/15/2012  T:  10/16/2012  Job:  [id number]

## 2012-10-30 ENCOUNTER — Other Ambulatory Visit (HOSPITAL_COMMUNITY): Payer: Self-pay | Admitting: Family Medicine

## 2012-10-30 DIAGNOSIS — R634 Abnormal weight loss: Secondary | ICD-10-CM

## 2012-10-30 DIAGNOSIS — R700 Elevated erythrocyte sedimentation rate: Secondary | ICD-10-CM

## 2012-11-04 ENCOUNTER — Ambulatory Visit (HOSPITAL_COMMUNITY)
Admission: RE | Admit: 2012-11-04 | Discharge: 2012-11-04 | Disposition: A | Discharge status: Home / Self Care | Payer: PRIVATE HEALTH INSURANCE | Source: Ambulatory Visit | Attending: Family Medicine | Admitting: Family Medicine

## 2012-11-04 DIAGNOSIS — K4090 Unilateral inguinal hernia, without obstruction or gangrene, not specified as recurrent: Secondary | ICD-10-CM | POA: Insufficient documentation

## 2012-11-04 DIAGNOSIS — R634 Abnormal weight loss: Secondary | ICD-10-CM

## 2012-11-04 DIAGNOSIS — R933 Abnormal findings on diagnostic imaging of other parts of digestive tract: Secondary | ICD-10-CM | POA: Insufficient documentation

## 2012-11-04 DIAGNOSIS — R700 Elevated erythrocyte sedimentation rate: Secondary | ICD-10-CM

## 2012-11-04 MED ORDER — IOHEXOL 300 MG/ML  SOLN
100.0000 mL | Freq: Once | INTRAMUSCULAR | Status: AC | PRN
  Administered 2012-11-04: 100 mL via INTRAVENOUS

## 2012-12-03 ENCOUNTER — Encounter: Payer: Self-pay | Admitting: Gastroenterology

## 2012-12-03 ENCOUNTER — Ambulatory Visit (INDEPENDENT_AMBULATORY_CARE_PROVIDER_SITE_OTHER): Payer: PRIVATE HEALTH INSURANCE | Admitting: Gastroenterology

## 2012-12-03 ENCOUNTER — Encounter (INDEPENDENT_AMBULATORY_CARE_PROVIDER_SITE_OTHER): Payer: Self-pay

## 2012-12-03 VITALS — BP 111/67 | HR 76 | Temp 97.5°F | Ht 69.0 in | Wt 167.2 lb

## 2012-12-03 DIAGNOSIS — R6881 Early satiety: Secondary | ICD-10-CM | POA: Insufficient documentation

## 2012-12-03 DIAGNOSIS — K5900 Constipation, unspecified: Secondary | ICD-10-CM

## 2012-12-03 DIAGNOSIS — D649 Anemia, unspecified: Secondary | ICD-10-CM

## 2012-12-03 DIAGNOSIS — Z86010 Personal history of colonic polyps: Secondary | ICD-10-CM

## 2012-12-03 LAB — IRON: Iron: 103 ug/dL (ref 42–165)

## 2012-12-03 MED ORDER — LUBIPROSTONE 24 MCG PO CAPS: 24.0000 ug | ORAL_CAPSULE | Freq: Two times a day (BID) | ORAL | Status: DC

## 2012-12-03 MED ORDER — PEG 3350-KCL-NA BICARB-NACL 420 G PO SOLR: 4000.0000 mL | ORAL | Status: DC

## 2012-12-03 NOTE — Patient Instructions (Signed)
Start taking Amitiza 1 capsule twice a day with meals to avoid nausea. I have sent this to your pharmacy and provided samples to get you started.   We have scheduled you for a colonoscopy and upper endoscopy with Dr. Jena Gauss in the near future.  Please have blood work drawn today. We will call with the results!

## 2012-12-03 NOTE — Progress Notes (Signed)
 Referring Provider: Knowlton, Stephen D, MD Primary Care Physician:  KNOWLTON,STEPHEN D, MD Primary GI: Dr. Rourk   Chief Complaint  Patient presents with  . Colonoscopy  . EGD  . Constipation    HPI:   [name] is a pleasant 78-year-old male who was last seen by our practice in 2011 and underwent a colonoscopy and EGD with empiric dilation. He is actually due for surveillance colonoscopy now due to multiple tubular adenomas in 2011. Hx of chronic constipation. Suppositories, Miralax, stool softeners. Not doing well. Has to use epsom salts.  Occasional low-volume hematochezia. BM every 4-5 days. Small balls. Amitiza worked well in the past. Abdominal discomfort secondary to constipation. Prilosec 20 mg daily for GERD. No dysphagia. reports weight loss of about 20 lbs within a few months. Several recent hospital admissions. States wasn't eating as much during that time. EARLY SATIETY noted. No melena. Sometimes LLQ discomfort at site of known hernia.    CT Nov 2014: thickened distal rectum.   Past Medical History  Diagnosis Date  . Hypertension   . Stroke     Left side weakness.  . Shortness of breath     with activity  . Sleep apnea     does not use CPAP.  Tested maybe 5- 10 years ago  . GERD (gastroesophageal reflux disease)   . Constipation   . Glaucoma     Past Surgical History  Procedure Laterality Date  . Hernia repair Right     inguinal  . Cervical fusion    . Anterior cervical decomp/discectomy fusion N/A 07/01/2012    Procedure: ANTERIOR CERVICAL DECOMPRESSION/DISCECTOMY FUSION CERVICAL FIVE-SIX /HARDWARE REMOVAL CERVICAL SIX-SEVEN;  Surgeon: Henry A Pool, MD;  Location: [facility];  Service: Neurosurgery;  Laterality: N/A;  . Esophagogastroduodenoscopy  04/14/2002    RMR:Schatzki's' ring, status post dilation as described above/Hiatal hernia, focal antral erosions of uncertain clinical significance  . Colonoscopy   08/04/2002      RMR: Normal rectum/Pancolonic  diverticula/Colonic polyps as described above, biopsied and/or snared  . Esophagogastroduodenoscopy  06/09/2009    RMR: normal esophagus s/p dilator/small hiatal hernia otherwise normal  . Colonoscopy  60/08/2009    RMR: normal rectum/left and right sided diverticula/multiple colonic polyps. tubular adenomas. surveillance due 2014    Current Outpatient Prescriptions  Medication Sig Dispense Refill  . aspirin EC 81 MG tablet Take 81 mg by mouth daily.      . cholecalciferol (VITAMIN D) 1000 UNITS tablet Take 1,000 Units by mouth daily.      . fluticasone (FLONASE) 50 MCG/ACT nasal spray Place 2 sprays into the nose daily as needed for rhinitis.      . hydrochlorothiazide (HYDRODIURIL) 25 MG tablet Take 25 mg by mouth daily.      . lisinopril (PRINIVIL,ZESTRIL) 10 MG tablet Take 10 mg by mouth daily.      . metoCLOPramide (REGLAN) 10 MG tablet Take 10 mg by mouth at bedtime.      . Multiple Vitamin (MULTIVITAMIN WITH MINERALS) TABS Take 1 tablet by mouth daily.      . nitroGLYCERIN (NITROSTAT) 0.4 MG SL tablet Place 0.4 mg under the tongue every 5 (five) minutes as needed for chest pain.      . omeprazole (PRILOSEC) 20 MG capsule Take 20 mg by mouth daily.      . polyethylene glycol (MIRALAX / GLYCOLAX) packet Take 17 g by mouth as needed.      . potassium chloride SA (K-DUR,KLOR-CON) 20 MEQ tablet   Take 20 mEq by mouth 2 (two) times daily.      . solifenacin (VESICARE) 5 MG tablet Take 5 mg by mouth daily.      . lubiprostone (AMITIZA) 24 MCG capsule Take 1 capsule (24 mcg total) by mouth 2 (two) times daily with a meal.  60 capsule  11  . polyethylene glycol-electrolytes (TRILYTE) 420 G solution Take 4,000 mLs by mouth as directed.  4000 mL  0   No current facility-administered medications for this visit.    Allergies as of 12/03/2012 - Review Complete 12/03/2012  Allergen Reaction Noted  . Aspirin  07/25/2006    Family History  Problem Relation Age of Onset  . Colon cancer Neg Hx      History   Social History  . Marital Status: Married    Spouse Name: N/A    Number of Children: N/A  . Years of Education: N/A   Social History Main Topics  . Smoking status: Current Every Day Smoker -- 0.40 packs/day for 60 years  . Smokeless tobacco: None  . Alcohol Use: No  . Drug Use: No  . Sexual Activity: None   Other Topics Concern  . None   Social History Narrative  . None    Review of Systems: Gen: see HPI CV: Denies chest pain, palpitations, syncope, peripheral edema, and claudication. Resp: mild DOE GI: see HPI Derm: Denies rash, itching, dry skin Psych: short-term memory loss  Heme: Denies bruising, bleeding, and enlarged lymph nodes.  Physical Exam: BP 111/67  Pulse 76  Temp(Src) 97.5 F (36.4 C) (Oral)  Ht 5' 9" (1.753 m)  Wt 167 lb 3.2 oz (75.841 kg)  BMI 24.68 kg/m2 General:   Alert and oriented. No distress noted. Pleasant and cooperative.  Head:  Normocephalic and atraumatic. Eyes:  Conjuctiva clear without scleral icterus. Mouth:  Oral mucosa pink and moist. Poor dentition Neck:  Supple, without mass or thyromegaly. Heart:  S1, S2 present without murmurs, rubs, or gallops. Regular rate and rhythm. Abdomen:  +BS, soft, mild discomfort LLQ and non-distended. No rebound or guarding. No HSM or masses noted. Msk:  Has to have assistance walking. Wheelchair all the time at home.  Extremities:  Without edema. Neurologic:  Alert and  oriented x4;  grossly normal neurologically. Skin:  Intact without significant lesions or rashes. Psych:  Alert and cooperative. Normal mood and affect.  Outside labs Oct 2014 by Dr. Knowlton:  Hgb 12.7 (was 14 in Jan) Hct 38.2   LFTs normal  CT Nov 04, 2012 at [place]: IMPRESSION:  1. No clear explanation for weight loss.  2. Moderate left inguinal hernia contains a nonobstructed loop of  descending colon.  3. Thickened distal rectum is nonspecific. Consider digital rectal  exam or flex sig for  evaluation.  

## 2012-12-05 ENCOUNTER — Encounter: Payer: Self-pay | Admitting: Gastroenterology

## 2012-12-05 DIAGNOSIS — D649 Anemia, unspecified: Secondary | ICD-10-CM | POA: Insufficient documentation

## 2012-12-05 NOTE — Assessment & Plan Note (Signed)
Resume Amitiza 24 mcg BID.  

## 2012-12-05 NOTE — Assessment & Plan Note (Signed)
New onset, normocytic. Check iron and ferritin now.

## 2012-12-05 NOTE — Assessment & Plan Note (Signed)
Multiple tubular adenomas on colonoscopy in 2011. Due for surveillance now. Occasional low-volume hematochezia likely benign anorectal source. On CT recently, non-specific thickened distal rectum. Needs lower GI evaluation for both surveillance and diagnostic purposes in near future.  Proceed with TCS with Dr. Jena Gauss in near future: the risks, benefits, and alternatives have been discussed with the patient in detail. The patient states understanding and desires to proceed.

## 2012-12-05 NOTE — Assessment & Plan Note (Signed)
Decreased appetite, early satiety, reported weight loss of 20 lbs in past few months. Last EGD in 2011; however, I recommend an EGD at time of colonoscopy due to dyspepsia. Concern for gastritis, PUD, occult malignancy. NO dysphagia reported.   Proceed with upper endoscopy in the near future with Dr. Jena Gauss. The risks, benefits, and alternatives have been discussed in detail with patient. They have stated understanding and desire to proceed.

## 2012-12-09 NOTE — Progress Notes (Signed)
cc'd to pcp 

## 2012-12-10 NOTE — Progress Notes (Signed)
Quick Note:  Ferritin and iron normal. Proceed with EGD/colonoscopy with Dr. Jena Gauss as planned. ______

## 2012-12-11 ENCOUNTER — Encounter (HOSPITAL_COMMUNITY): Payer: Self-pay | Admitting: Pharmacy Technician

## 2012-12-18 ENCOUNTER — Encounter (HOSPITAL_COMMUNITY): Payer: Self-pay | Admitting: *Deleted

## 2012-12-18 ENCOUNTER — Encounter (HOSPITAL_COMMUNITY)
Admission: RE | Disposition: A | Discharge status: Home / Self Care | Payer: Self-pay | Source: Ambulatory Visit | Attending: Internal Medicine

## 2012-12-18 ENCOUNTER — Telehealth: Payer: Self-pay

## 2012-12-18 ENCOUNTER — Ambulatory Visit (HOSPITAL_COMMUNITY)
Admission: RE | Admit: 2012-12-18 | Discharge: 2012-12-18 | Disposition: A | Discharge status: Home / Self Care | Payer: PRIVATE HEALTH INSURANCE | Source: Ambulatory Visit | Attending: Internal Medicine | Admitting: Internal Medicine

## 2012-12-18 DIAGNOSIS — K648 Other hemorrhoids: Secondary | ICD-10-CM | POA: Insufficient documentation

## 2012-12-18 DIAGNOSIS — Z79899 Other long term (current) drug therapy: Secondary | ICD-10-CM | POA: Diagnosis not present

## 2012-12-18 DIAGNOSIS — D126 Benign neoplasm of colon, unspecified: Secondary | ICD-10-CM | POA: Insufficient documentation

## 2012-12-18 DIAGNOSIS — Z86010 Personal history of colonic polyps: Secondary | ICD-10-CM

## 2012-12-18 DIAGNOSIS — K2970 Gastritis, unspecified, without bleeding: Secondary | ICD-10-CM

## 2012-12-18 DIAGNOSIS — I10 Essential (primary) hypertension: Secondary | ICD-10-CM | POA: Diagnosis not present

## 2012-12-18 DIAGNOSIS — K921 Melena: Secondary | ICD-10-CM | POA: Diagnosis not present

## 2012-12-18 DIAGNOSIS — K2990 Gastroduodenitis, unspecified, without bleeding: Secondary | ICD-10-CM

## 2012-12-18 DIAGNOSIS — K5730 Diverticulosis of large intestine without perforation or abscess without bleeding: Secondary | ICD-10-CM

## 2012-12-18 DIAGNOSIS — R634 Abnormal weight loss: Secondary | ICD-10-CM

## 2012-12-18 DIAGNOSIS — K5900 Constipation, unspecified: Secondary | ICD-10-CM

## 2012-12-18 DIAGNOSIS — Z7982 Long term (current) use of aspirin: Secondary | ICD-10-CM | POA: Insufficient documentation

## 2012-12-18 DIAGNOSIS — D649 Anemia, unspecified: Secondary | ICD-10-CM

## 2012-12-18 DIAGNOSIS — R6881 Early satiety: Secondary | ICD-10-CM | POA: Insufficient documentation

## 2012-12-18 DIAGNOSIS — K2940 Chronic atrophic gastritis without bleeding: Secondary | ICD-10-CM | POA: Diagnosis not present

## 2012-12-18 HISTORY — PX: COLONOSCOPY WITH ESOPHAGOGASTRODUODENOSCOPY (EGD): SHX5779

## 2012-12-18 SURGERY — COLONOSCOPY WITH ESOPHAGOGASTRODUODENOSCOPY (EGD)
Anesthesia: Moderate Sedation

## 2012-12-18 MED ORDER — BUTAMBEN-TETRACAINE-BENZOCAINE 2-2-14 % EX AERO
INHALATION_SPRAY | CUTANEOUS | Status: DC | PRN
  Administered 2012-12-18: 2 via TOPICAL

## 2012-12-18 MED ORDER — MIDAZOLAM HCL 5 MG/5ML IJ SOLN
INTRAMUSCULAR | Status: DC | PRN
  Administered 2012-12-18 (×3): 1 mg via INTRAVENOUS
  Administered 2012-12-18: 2 mg via INTRAVENOUS

## 2012-12-18 MED ORDER — MIDAZOLAM HCL 5 MG/5ML IJ SOLN
INTRAMUSCULAR | Status: AC
  Filled 2012-12-18: qty 10

## 2012-12-18 MED ORDER — STERILE WATER FOR IRRIGATION IR SOLN
Status: DC | PRN
  Administered 2012-12-18: 13:00:00

## 2012-12-18 MED ORDER — ONDANSETRON HCL 4 MG/2ML IJ SOLN
INTRAMUSCULAR | Status: AC
  Filled 2012-12-18: qty 2

## 2012-12-18 MED ORDER — SODIUM CHLORIDE 0.9 % IV SOLN
INTRAVENOUS | Status: DC
  Administered 2012-12-18: 1000 mL via INTRAVENOUS

## 2012-12-18 MED ORDER — MEPERIDINE HCL 100 MG/ML IJ SOLN
INTRAMUSCULAR | Status: DC | PRN
  Administered 2012-12-18 (×3): 25 mg via INTRAVENOUS

## 2012-12-18 MED ORDER — MEPERIDINE HCL 100 MG/ML IJ SOLN
INTRAMUSCULAR | Status: AC
  Filled 2012-12-18: qty 2

## 2012-12-18 MED ORDER — ONDANSETRON HCL 4 MG/2ML IJ SOLN
INTRAMUSCULAR | Status: DC | PRN
  Administered 2012-12-18: 4 mg via INTRAVENOUS

## 2012-12-18 NOTE — Op Note (Signed)
[facility] [address] [place], [address]   COLONOSCOPY PROCEDURE REPORT  PATIENT: [name], [name]  MR#:         [id number] BIRTHDATE: 1934-12-01 , 78  yrs. old GENDER: Male ENDOSCOPIST: R.  Roetta Sessions, MD FACP FACG REFERRED BY:  Gareth Morgan, M.D. PROCEDURE DATE:  12/18/2012 PROCEDURE:     Colonoscopy with multiple snare polypectomies  INDICATIONS: hematochezia; history of colonic adenoma  INFORMED CONSENT:  The risks, benefits, alternatives and imponderables including but not limited to bleeding, perforation as well as the possibility of a missed lesion have been reviewed.  The potential for biopsy, lesion removal, etc. have also been discussed.  Questions have been answered.  All parties agreeable. Please see the history and physical in the medical record for more information.  MEDICATIONS: Versed 5 mg IV and Demerol 75 mg IV in divided.. Zofran 4 mg IV  DESCRIPTION OF PROCEDURE:  After a digital rectal exam was performed, the EG-2990i ([id number]) and EC-3890Li ([id number]) colonoscope was advanced from the anus through the rectum and colon to the area of the cecum, ileocecal valve and appendiceal orifice. The cecum was deeply intubated.  These structures were well-seen and photographed for the record.  From the level of the cecum and ileocecal valve, the scope was slowly and cautiously withdrawn. The mucosal surfaces were carefully surveyed utilizing scope tip deflection to facilitate fold flattening as needed.  The scope was pulled down into the rectum where a thorough examination including retroflexion was performed.    FINDINGS:  Adequate preparation. Friable anal canal hemorrhoids; otherwise normal rectum. Pancolonic diverticulosis. Multiple colonic polyps; (2) at the splenic flexure; (1) at the hepatic flexure and (1) in the mid ascending  segment. The largest polyp was approximately 8 mm in dimensions. The remainder of the colonic mucosa  appeared normal.  THERAPEUTIC / DIAGNOSTIC MANEUVERS PERFORMED:  All of the above-mentioned polyps were either hot or cold snare removed.  COMPLICATIONS: None  CECAL WITHDRAWAL TIME:  15 minute  IMPRESSION:  Colonic diverticulosis. Multiple colonic polyps-removed as described above. Friable anal canal hemorrhoids-likely source of hematochezia  RECOMMENDATIONS:   Followup on pathology. See EGD report. Begin Benefiber 2 teaspoons twice daily. Course of Anusol suppositories - one per rectum twice a day x10 days. MiraLax 17 g orally at bedtime as needed for constipation.   _______________________________ eSigned:  R. Roetta Sessions, MD FACP [facility] 12/18/2012 2:03 PM   CC:

## 2012-12-18 NOTE — Telephone Encounter (Signed)
Pharmacist called- Tana [place] is not covered by pts insurance and it costs $70.00. They want to know if its ok to change to proctozone cream instead. Pharmacist said it would be covered by insurance.

## 2012-12-18 NOTE — Telephone Encounter (Signed)
Yes

## 2012-12-18 NOTE — Interval H&P Note (Signed)
History and Physical Interval Note:  12/18/2012 1:01 PM  [name]  has presented today for surgery, with the diagnosis of EARLY SATIETY AND CONSTIPATION  The various methods of treatment have been discussed with the patient and family. After consideration of risks, benefits and other options for treatment, the patient has consented to  Procedure(s) with comments: COLONOSCOPY WITH ESOPHAGOGASTRODUODENOSCOPY (EGD) (N/A) - 11:15 as a surgical intervention .  The patient's history has been reviewed, patient examined, no change in status, stable for surgery.  I have reviewed the patient's chart and labs.  Questions were answered to the patient's satisfaction.     No change. EGD and colonoscopy per plan.  The risks, benefits, limitations, imponderables and alternatives regarding both EGD and colonoscopy have been reviewed with the patient. Questions have been answered. All parties agreeable.   Eula Listen

## 2012-12-18 NOTE — H&P (View-Only) (Signed)
Referring Provider: Milana Obey, MD Primary Care Physician:  Milana Obey, MD Primary GI: Dr. Jena Gauss   Chief Complaint  Patient presents with  . Colonoscopy  . EGD  . Constipation    HPI:   [name] is a pleasant 78 year old male who was last seen by our practice in 2011 and underwent a colonoscopy and EGD with empiric dilation. He is actually due for surveillance colonoscopy now due to multiple tubular adenomas in 2011. Hx of chronic constipation. Suppositories, Miralax, stool softeners. Not doing well. Has to use epsom salts.  Occasional low-volume hematochezia. BM every 4-5 days. Small balls. Amitiza worked well in the past. Abdominal discomfort secondary to constipation. Prilosec 20 mg daily for GERD. No dysphagia. reports weight loss of about 20 lbs within a few months. Several recent hospital admissions. States wasn't eating as much during that time. EARLY SATIETY noted. No melena. Sometimes LLQ discomfort at site of known hernia.    CT Nov 2014: thickened distal rectum.   Past Medical History  Diagnosis Date  . Hypertension   . Stroke     Left side weakness.  . Shortness of breath     with activity  . Sleep apnea     does not use CPAP.  Tested maybe 5- 10 years ago  . GERD (gastroesophageal reflux disease)   . Constipation   . Glaucoma     Past Surgical History  Procedure Laterality Date  . Hernia repair Right     inguinal  . Cervical fusion    . Anterior cervical decomp/discectomy fusion N/A 07/01/2012    Procedure: ANTERIOR CERVICAL DECOMPRESSION/DISCECTOMY FUSION CERVICAL FIVE-SIX [name] REMOVAL CERVICAL SIX-SEVEN;  Surgeon: Temple Pacini, MD;  Location: [facility];  Service: Neurosurgery;  Laterality: N/A;  . Esophagogastroduodenoscopy  04/14/2002    ZOX:WRUEAVWU'J' ring, status post dilation as described above/Hiatal hernia, focal antral erosions of uncertain clinical significance  . Colonoscopy   08/04/2002      RMR: Normal rectum/Pancolonic  diverticula/Colonic polyps as described above, biopsied and/or snared  . Esophagogastroduodenoscopy  06/09/2009    RMR: normal esophagus s/p dilator/small hiatal hernia otherwise normal  . Colonoscopy  60/08/2009    RMR: normal rectum/left and right sided diverticula/multiple colonic polyps. tubular adenomas. surveillance due 2014    Current Outpatient Prescriptions  Medication Sig Dispense Refill  . aspirin EC 81 MG tablet Take 81 mg by mouth daily.      . cholecalciferol (VITAMIN D) 1000 UNITS tablet Take 1,000 Units by mouth daily.      . fluticasone (FLONASE) 50 MCG/ACT nasal spray Place 2 sprays into the nose daily as needed for rhinitis.      . hydrochlorothiazide (HYDRODIURIL) 25 MG tablet Take 25 mg by mouth daily.      [name] lisinopril (PRINIVIL,ZESTRIL) 10 MG tablet Take 10 mg by mouth daily.      . metoCLOPramide (REGLAN) 10 MG tablet Take 10 mg by mouth at bedtime.      . Multiple Vitamin (MULTIVITAMIN WITH MINERALS) TABS Take 1 tablet by mouth daily.      . nitroGLYCERIN (NITROSTAT) 0.4 MG SL tablet Place 0.4 mg under the tongue every 5 (five) minutes as needed for chest pain.      [name] omeprazole (PRILOSEC) 20 MG capsule Take 20 mg by mouth daily.      . polyethylene glycol (MIRALAX / GLYCOLAX) packet Take 17 g by mouth as needed.      . potassium chloride SA (K-DUR,KLOR-CON) 20 MEQ tablet  Take 20 mEq by mouth 2 (two) times daily.      . solifenacin (VESICARE) 5 MG tablet Take 5 mg by mouth daily.      [name] lubiprostone (AMITIZA) 24 MCG capsule Take 1 capsule (24 mcg total) by mouth 2 (two) times daily with a meal.  60 capsule  11  . polyethylene glycol-electrolytes (TRILYTE) 420 G solution Take 4,000 mLs by mouth as directed.  4000 mL  0   No current facility-administered medications for this visit.    Allergies as of 12/03/2012 - Review Complete 12/03/2012  Allergen Reaction Noted  . Aspirin  07/25/2006    Family History  Problem Relation Age of Onset  . Colon cancer Neg Hx      History   Social History  . Marital Status: Married    Spouse Name: N/A    Number of Children: N/A  . Years of Education: N/A   Social History Main Topics  . Smoking status: Current Every Day Smoker -- 0.40 packs/day for 60 years  . Smokeless tobacco: None  . Alcohol Use: No  . Drug Use: No  . Sexual Activity: None   Other Topics Concern  . None   Social History Narrative  . None    Review of Systems: Gen: see HPI CV: Denies chest pain, palpitations, syncope, peripheral edema, and claudication. Resp: mild DOE GI: see HPI Derm: Denies rash, itching, dry skin Psych: short-term memory loss  Heme: Denies bruising, bleeding, and enlarged lymph nodes.  Physical Exam: BP 111/67  Pulse 76  Temp(Src) 97.5 F (36.4 C) (Oral)  Ht 5\' 9"  (1.753 m)  Wt 167 lb 3.2 oz (75.841 kg)  BMI 24.68 kg/m2 General:   Alert and oriented. No distress noted. Pleasant and cooperative.  Head:  Normocephalic and atraumatic. Eyes:  Conjuctiva clear without scleral icterus. Mouth:  Oral mucosa pink and moist. Poor dentition Neck:  Supple, without mass or thyromegaly. Heart:  S1, S2 present without murmurs, rubs, or gallops. Regular rate and rhythm. Abdomen:  +BS, soft, mild discomfort LLQ and non-distended. No rebound or guarding. No HSM or masses noted. Msk:  Has to have assistance walking. Wheelchair all the time at home.  Extremities:  Without edema. Neurologic:  Alert and  oriented x4;  grossly normal neurologically. Skin:  Intact without significant lesions or rashes. Psych:  Alert and cooperative. Normal mood and affect.  Outside labs Oct 2014 by Dr. Sudie Bailey:  Hgb 12.7 (was 14 in Jan) Hct 38.2   LFTs normal  CT Nov 04, 2012 at [facility]: IMPRESSION:  1. No clear explanation for weight loss.  2. Moderate left inguinal hernia contains a nonobstructed loop of  descending colon.  3. Thickened distal rectum is nonspecific. Consider digital rectal  exam or flex sig for  evaluation.

## 2012-12-18 NOTE — Op Note (Signed)
[facility] [address] [place], [address]   ENDOSCOPY PROCEDURE REPORT  PATIENT: [name], [name]  MR#: [id number] BIRTHDATE: 1934/05/19 , 78  yrs. old GENDER: Male ENDOSCOPIST: R.  Roetta Sessions, MD FACP FACG REFERRED BY:  Gareth Morgan, M.D. PROCEDURE DATE:  12/18/2012 PROCEDURE:     EGD with gastric biopsy  INDICATIONS:     Early satiety; weight loss  INFORMED CONSENT:   The risks, benefits, limitations, alternatives and imponderables have been discussed.  The potential for biopsy, esophogeal dilation, etc. have also been reviewed.  Questions have been answered.  All parties agreeable.  Please see the history and physical in the medical record for more information.  MEDICATIONS:    Versed 4 mg IV and Demerol 50 mg IV in divided doses.   Cetacaine spray.  Zofran 4 mg IV  DESCRIPTION OF PROCEDURE:   The EG-2990i ([id number])  endoscope was introduced through the mouth and advanced to the second portion of the duodenum without difficulty or limitations.  The mucosal surfaces were surveyed very carefully during advancement of the scope and upon withdrawal.  Retroflexion view of the proximal stomach and esophagogastric junction was performed.      FINDINGS: Normal appearing tubular esophagus. Stomach empty. Scattered gastric erosions. No ulcer or infiltrating process. Patent pylorus. Normal first and second portion of the duodenum  THERAPEUTIC / DIAGNOSTIC MANEUVERS PERFORMED:  Biopsies of the abnormal gastric mucosa taken for histologic study   COMPLICATIONS:  None  IMPRESSION:   Gastric erosions-status post biopsy  RECOMMENDATIONS:  Followup on pathology. Further recommendations to follow. See colonoscopy report    _______________________________ R. Roetta Sessions, MD FACP [facility] eSigned:  R. Roetta Sessions, MD FACP [facility] 12/18/2012 1:28 PM     CC:

## 2012-12-18 NOTE — Telephone Encounter (Signed)
Pharmacist at [facility] is aware.

## 2012-12-21 ENCOUNTER — Encounter: Payer: Self-pay | Admitting: Internal Medicine

## 2012-12-23 ENCOUNTER — Telehealth: Payer: Self-pay

## 2012-12-23 ENCOUNTER — Encounter: Payer: Self-pay | Admitting: Internal Medicine

## 2012-12-23 ENCOUNTER — Encounter (HOSPITAL_COMMUNITY): Payer: Self-pay | Admitting: Internal Medicine

## 2012-12-23 NOTE — Telephone Encounter (Signed)
Cc PCP 

## 2012-12-23 NOTE — Telephone Encounter (Signed)
Pt is aware of OV on 1/20 at 130 with AS and appt card was mailed

## 2012-12-23 NOTE — Telephone Encounter (Signed)
Letter mailed to pt. [name], please schedule pt ov.

## 2012-12-23 NOTE — Telephone Encounter (Signed)
Letter from: Corbin Ade  Reason for Letter: Results Review  Send letter to patient.  Send copy of letter with path to referring provider and PCP.   Needs ov w extender 4-6 weeks to review ongoing gi sx

## 2013-01-14 LAB — CBC
HCT: 38 %
HGB: 12.7 g/dL

## 2013-01-14 LAB — COMPREHENSIVE METABOLIC PANEL
ALBUMIN: 1.2
ALT: 9 U/L — AB (ref 10–40)
AST: 16 U/L
Alkaline Phosphatase: 62 U/L
BILIRUBIN TOTAL: 0.3 mg/dL

## 2013-01-21 ENCOUNTER — Encounter (INDEPENDENT_AMBULATORY_CARE_PROVIDER_SITE_OTHER): Payer: Self-pay

## 2013-01-21 ENCOUNTER — Encounter: Payer: Self-pay | Admitting: Gastroenterology

## 2013-01-21 ENCOUNTER — Ambulatory Visit (INDEPENDENT_AMBULATORY_CARE_PROVIDER_SITE_OTHER): Payer: PRIVATE HEALTH INSURANCE | Admitting: Gastroenterology

## 2013-01-21 VITALS — BP 101/61 | HR 78 | Temp 98.4°F | Wt 167.0 lb

## 2013-01-21 DIAGNOSIS — K5900 Constipation, unspecified: Secondary | ICD-10-CM

## 2013-01-21 NOTE — Assessment & Plan Note (Signed)
Continue Amitiza 24 mcg BID; colonoscopy on file and due for surveillance if health permits in 5449. Overall, GI symptoms much improved, with patient denying early satiety and actually with stable weight. With prior hospitalizations, weight loss likely multifactorial. Return in 6 months or sooner if necessary.

## 2013-01-21 NOTE — Progress Notes (Signed)
Referring Provider: Robert Bellow, MD Primary Care Physician:  Robert Bellow, MD Primary GI: Dr. [name]   Chief Complaint  Patient presents with  . Follow-up    HPI:   [name] presents today in follow-up after TCS/EGD. Hx of chronic constipation, recent weight loss, early satiety. CT nov 2014 without evidence of occult malignancy. Weight stable since last seen. Appetite is better. Trying to eat 2 meals a day. Drinks coffee early in the morning. No abdominal pain. No N/V. Amitiza 24 mcg BID. BM 1-2 X per day. No diarrhea. No rectal bleeding.    Past Medical History  Diagnosis Date  . Hypertension   . Stroke     Left side weakness.  . Shortness of breath     with activity  . Sleep apnea     does not use CPAP.  Tested maybe 5- 10 years ago  . GERD (gastroesophageal reflux disease)   . Constipation   . Glaucoma     Past Surgical History  Procedure Laterality Date  . Hernia repair Right     inguinal  . Cervical fusion    . Anterior cervical decomp/discectomy fusion N/A 07/01/2012    Procedure: ANTERIOR CERVICAL DECOMPRESSION/DISCECTOMY FUSION CERVICAL FIVE-SIX [name] REMOVAL CERVICAL SIX-SEVEN;  Surgeon: [name], MD;  Location: [facility];  Service: Neurosurgery;  Laterality: N/A;  . Esophagogastroduodenoscopy  04/14/2002    OZD:GUYQIHKV'Q' ring, status post dilation as described above/Hiatal hernia, focal antral erosions of uncertain clinical significance  . Colonoscopy   08/04/2002      RMR: Normal rectum/Pancolonic diverticula/Colonic polyps as described above, biopsied and/or snared  . Esophagogastroduodenoscopy  06/09/2009    RMR: normal esophagus s/p dilator/small hiatal hernia otherwise normal  . Colonoscopy  60/08/2009    RMR: normal rectum/left and right sided diverticula/multiple colonic polyps. tubular adenomas. surveillance due 2014  . Colonoscopy with esophagogastroduodenoscopy (egd) N/A 12/18/2012    Dr. [name]:  Colonic diverticulosis.  Multiple colonic polyps-hyperplastic polyps and tubular adenoma. Friable anal canal hemorrhoids. EGD with chronic atrophic gastritis    Current Outpatient Prescriptions  Medication Sig Dispense Refill  . aspirin EC 81 MG tablet Take 81 mg by mouth daily.      . cholecalciferol (VITAMIN D) 1000 UNITS tablet Take 1,000 Units by mouth daily.      . fluticasone (FLONASE) 50 MCG/ACT nasal spray Place 2 sprays into the nose daily as needed for rhinitis.      . hydrochlorothiazide (HYDRODIURIL) 25 MG tablet Take 25 mg by mouth daily.      [name] ibuprofen (ADVIL,MOTRIN) 200 MG tablet Take 200 mg by mouth every 6 (six) hours as needed.      [name] lisinopril (PRINIVIL,ZESTRIL) 10 MG tablet Take 10 mg by mouth daily.      [name] lubiprostone (AMITIZA) 24 MCG capsule Take 1 capsule (24 mcg total) by mouth 2 (two) times daily with a meal.  60 capsule  11  . metoCLOPramide (REGLAN) 10 MG tablet Take 10 mg by mouth at bedtime.      . Multiple Vitamin (MULTIVITAMIN WITH MINERALS) TABS Take 1 tablet by mouth daily.      . nitroGLYCERIN (NITROSTAT) 0.4 MG SL tablet Place 0.4 mg under the tongue every 5 (five) minutes as needed for chest pain.      [name] omeprazole (PRILOSEC) 20 MG capsule Take 20 mg by mouth daily.      . potassium chloride SA (K-DUR,KLOR-CON) 20 MEQ tablet Take 20 mEq by mouth 2 (  two) times daily.      . solifenacin (VESICARE) 5 MG tablet Take 5 mg by mouth daily.      . polyethylene glycol-electrolytes (TRILYTE) 420 G solution Take 4,000 mLs by mouth as directed.  4000 mL  0   No current facility-administered medications for this visit.    Allergies as of 01/21/2013 - Review Complete 01/21/2013  Allergen Reaction Noted  . Aspirin Nausea And Vomiting 07/25/2006    Family History  Problem Relation Age of Onset  . Colon cancer Neg Hx     History   Social History  . Marital Status: Married    Spouse Name: N/A    Number of Children: N/A  . Years of Education: N/A   Social History Main Topics  .  Smoking status: Current Every Day Smoker -- 0.40 packs/day for 60 years  . Smokeless tobacco: None  . Alcohol Use: No  . Drug Use: No  . Sexual Activity: None   Other Topics Concern  . None   Social History Narrative  . None    Review of Systems: As mentioned in HPI.   Physical Exam: BP 101/61  Pulse 78  Temp(Src) 98.4 F (36.9 C) (Oral)  Wt 167 lb (75.751 kg) General:   Alert and oriented. No distress noted. Pleasant and cooperative.  Head:  Normocephalic and atraumatic. Eyes:  Conjuctiva clear without scleral icterus. Heart:  S1, S2 present without murmurs, rubs, or gallops. Regular rate and rhythm. Abdomen:  +BS, soft, non-tender and non-distended. No rebound or guarding. Extremities:  Without edema. Neurologic:  Alert and  oriented x4;  grossly normal neurologically. Skin:  Intact without significant lesions or rashes. Psych:  Alert and cooperative. Normal mood and affect.

## 2013-01-21 NOTE — Patient Instructions (Signed)
Continue taking Amitiza 1 capsule with food twice a day.   Please watch for any nausea, decreased appetite, weight loss, or constipation. Please call me if you have any of these issues.  Otherwise, we will see you in 6 months!

## 2013-01-22 NOTE — Progress Notes (Signed)
cc'd to pcp 

## 2013-07-30 ENCOUNTER — Encounter: Payer: Self-pay | Admitting: Internal Medicine

## 2013-07-31 ENCOUNTER — Encounter: Payer: Self-pay | Admitting: *Deleted

## 2013-08-01 ENCOUNTER — Ambulatory Visit (INDEPENDENT_AMBULATORY_CARE_PROVIDER_SITE_OTHER): Payer: PRIVATE HEALTH INSURANCE | Admitting: Neurology

## 2013-08-01 ENCOUNTER — Encounter: Payer: Self-pay | Admitting: Neurology

## 2013-08-01 VITALS — BP 114/66 | HR 75 | Ht 69.0 in

## 2013-08-01 DIAGNOSIS — R269 Unspecified abnormalities of gait and mobility: Secondary | ICD-10-CM | POA: Insufficient documentation

## 2013-08-01 HISTORY — DX: Unspecified abnormalities of gait and mobility: R26.9

## 2013-08-01 MED ORDER — BACLOFEN 10 MG PO TABS: ORAL_TABLET | ORAL | Status: DC

## 2013-08-01 NOTE — Patient Instructions (Signed)

## 2013-08-01 NOTE — Progress Notes (Signed)
Reason for visit: Gait disorder  [name] is a 79 y.o. male  History of present illness:  [name] is a 79 year old right-handed black male with a history of cerebrovascular disease sustaining a right PICA distribution stroke that occurred in 2002. The patient has had some difficulty with gait instability since that time. He has been walking with a walker, but he has been able to get around relatively well. On 08/10/2011, the patient had a sudden onset of problems with walking, and he has been in a wheelchair since that time. The patient indicates that when he tries to stand up, he will "bounce" on both legs. The patient will occasionally have extensor spasms of the legs at nighttime while sleeping. The patient has some urgency of the bladder, and some incontinence of the bowels. He has had recent cervical spine surgery done in June of 2014. He recently had MRI evaluation of the brain approximately one year ago, which shows no significant change from a prior study done in November 2010. The patient reports no new numbness or weakness of the extremities. He has not had any falls at home, and he is remaining safe. The denies any visual loss, syncope, slurred speech or swallowing problems. He has never had physical therapy for his walking. He is sent to this office for an evaluation.  Past Medical History  Diagnosis Date  . Hypertension   . Stroke     Left side weakness.  . Shortness of breath     with activity  . Sleep apnea     does not use CPAP.  Tested maybe 5- 10 years ago  . GERD (gastroesophageal reflux disease)   . Constipation   . Glaucoma   . Abnormality of gait 08/01/2013  . Blindness of left eye     Posttraumatic    Past Surgical History  Procedure Laterality Date  . Hernia repair Right     inguinal  . Cervical fusion    . Anterior cervical decomp/discectomy fusion N/A 07/01/2012    Procedure: ANTERIOR CERVICAL DECOMPRESSION/DISCECTOMY FUSION CERVICAL FIVE-SIX  [name] REMOVAL CERVICAL SIX-SEVEN;  Surgeon: [name], MD;  Location: [facility];  Service: Neurosurgery;  Laterality: N/A;  . Esophagogastroduodenoscopy  04/14/2002    DVV:OHYWVPXT'G' ring, status post dilation as described above/Hiatal hernia, focal antral erosions of uncertain clinical significance  . Colonoscopy   08/04/2002      RMR: Normal rectum/Pancolonic diverticula/Colonic polyps as described above, biopsied and/or snared  . Esophagogastroduodenoscopy  06/09/2009    RMR: normal esophagus s/p dilator/small hiatal hernia otherwise normal  . Colonoscopy  60/08/2009    RMR: normal rectum/left and right sided diverticula/multiple colonic polyps. tubular adenomas. surveillance due 2014  . Colonoscopy with esophagogastroduodenoscopy (egd) N/A 12/18/2012    Dr. [name]:  Colonic diverticulosis. Multiple colonic polyps-hyperplastic polyps and tubular adenoma. Friable anal canal hemorrhoids. EGD with chronic atrophic gastritis    Family History  Problem Relation Age of Onset  . Colon cancer Neg Hx   . Cancer Mother     Social history:  reports that he has been smoking Cigarettes.  He has a 24 pack-year smoking history. He has never used smokeless tobacco. He reports that he does not drink alcohol or use illicit drugs.  Medications:  Current Outpatient Prescriptions on File Prior to Visit  Medication Sig Dispense Refill  . aspirin EC 81 MG tablet Take 81 mg by mouth daily.      . cholecalciferol (VITAMIN D) 1000 UNITS tablet  Take 1,000 Units by mouth daily.      . fluticasone (FLONASE) 50 MCG/ACT nasal spray Place 2 sprays into the nose daily as needed for rhinitis.      . hydrochlorothiazide (HYDRODIURIL) 25 MG tablet Take 25 mg by mouth daily.      [name] ibuprofen (ADVIL,MOTRIN) 200 MG tablet Take 200 mg by mouth every 6 (six) hours as needed.      [name] lisinopril (PRINIVIL,ZESTRIL) 10 MG tablet Take 10 mg by mouth daily.      [name] lubiprostone (AMITIZA) 24 MCG capsule Take 1 capsule (24  mcg total) by mouth 2 (two) times daily with a meal.  60 capsule  11  . metoCLOPramide (REGLAN) 10 MG tablet Take 10 mg by mouth at bedtime.      . Multiple Vitamin (MULTIVITAMIN WITH MINERALS) TABS Take 1 tablet by mouth daily.      . nitroGLYCERIN (NITROSTAT) 0.4 MG SL tablet Place 0.4 mg under the tongue every 5 (five) minutes as needed for chest pain.      [name] omeprazole (PRILOSEC) 20 MG capsule Take 20 mg by mouth daily.      . polyethylene glycol-electrolytes (TRILYTE) 420 G solution Take 4,000 mLs by mouth as directed.  4000 mL  0  . potassium chloride SA (K-DUR,KLOR-CON) 20 MEQ tablet Take 20 mEq by mouth 2 (two) times daily.      . solifenacin (VESICARE) 5 MG tablet Take 5 mg by mouth daily.       No current facility-administered medications on file prior to visit.      Allergies  Allergen Reactions  . Aspirin Nausea And Vomiting    Tolerates 81mg  daily    ROS:  Out of a complete 14 system review of symptoms, the patient complains only of the following symptoms, and all other reviewed systems are negative.  Weight loss, fatigue Chest pain, swelling in the legs Moles Blurred vision, blindness in the left eye Shortness of breath, cough, snoring Constipation Feeling cold Joint pain, joint swelling Memory loss, confusion, numbness, weakness Depression, anxiety, not enough sleep, decreased energy, change in appetite, disinterest in activities Insomnia, restless legs  Blood pressure 114/66, pulse 75, height 5\' 9"  (1.753 m), weight 0 lb (0 kg).  Physical Exam  General: The patient is alert and cooperative at the time of the examination.  Eyes: Pupils are round, and reactive to light on the right. Discs are flat on the right. The patient is blind in the left eye.  Neck: The neck is supple, no carotid bruits are noted.  Respiratory: The respiratory examination is clear on the left, but there are posterior rhonchi on the right.  Cardiovascular: The cardiovascular  examination reveals a regular rate and rhythm, no obvious murmurs or rubs are noted.  Skin: Extremities are with 1+ edema at the ankles bilaterally.  Neurologic Exam  Mental status: The patient is alert and oriented x 3 at the time of the examination. The patient has apparent normal recent and remote memory, with an apparently normal attention span and concentration ability.  Cranial nerves: Facial symmetry is present. There is good sensation of the face to pinprick and soft touch bilaterally. The strength of the facial muscles and the muscles to head turning and shoulder shrug are normal bilaterally. Speech is well enunciated, no aphasia or dysarthria is noted. Extraocular movements are full on the right, the patient has divergent some days on the left, blindness in the left eye. Visual fields are full. The tongue is  midline, and the patient has symmetric elevation of the soft palate. No obvious hearing deficits are noted.  Motor: The motor testing reveals 5 over 5 strength of all 4 extremities. There may be some increased motor tone in the legs. Good symmetric motor tone is noted in the arms.  Sensory: Sensory testing is intact to pinprick, soft touch, vibration sensation, and position sense on all 4 extremities, with exception that position sense is decreased in both feet. No evidence of extinction is noted.  Coordination: Cerebellar testing reveals mild dysmetria with finger-nose-finger bilaterally. With the lower extremities, the patient has dysmetria bilaterally, but it is much more significant on the left than the right.  Gait and station: Gait could not be effectively tested. With standing, the patient develops severe truncal ataxia, unable to effectively walk.  Reflexes: Deep tendon reflexes are symmetric and normal bilaterally. Toes are downgoing bilaterally.   MRI brain 11/19/2008:  Impression: This MRI scan of the brain shows remote age infarcts in both cerebellum and mild changes  of chronic microvascular ischemia and generalized cerebral atrophy.   Carotid doppler 10/13/12:  IMPRESSION:  Minimal plaque formation is seen involving the proximal right  internal carotid artery consistent with less than 50% diameter  stenosis based on ultrasound criteria. Moderate size partially  calcified plaque is noted in the left carotid bulb and extending  into proximal left internal carotid artery consistent with 50-69%  stenosis based on Doppler criteria.   MRI cervical 05/14/12:  IMPRESSION:  1. Solid C6-C7 fusion without recurrent stenosis.  2. Moderate C7-T1 central stenosis with right greater than left  foraminal stenosis due to disc and osteophytic spurring extending  into both neural foramina. This potentially affects both C8  nerves.  3. Moderate C5-C6 central stenosis associated with broad-based  disc osteophyte complex and posterior ligamentum flavum redundancy.  Right greater than left foraminal stenosis potentially affects both  C6 nerves.  4. Less pronounced degenerative disease at other levels, detailed  above.     Assessment/Plan:  1. Cerebrovascular disease, right cerebellar, medullary stroke  2. Gait disturbance  The patient has a severe gait disorder, and he has what appears to be severe ataxia that affects the left greater than right lower extremity. The patient has had a sudden change in his ability to ambulate on 08/10/2011. The patient may have had some minor extension of his prior stroke, but this is not apparent on MRI evaluation of the brain. The patient has some stiffness in the legs, and evaluation for spinal cord compression needs to be undertaken. He did have spinal surgery in the cervical area in June 2014, but no significant cord compression was noted at that time. He will undergo a MRI of the thoracic spine, and further blood work evaluation. He will be placed on low-dose baclofen, and he will be set up for physical therapy for gait  training. I am concerned that the majority of his gait problems are related to dysmetria, and this may be difficult to treat. He will followup in about 3-4 months.  Jill Alexanders MD 08/02/2013 10:22 AM  [facility] [address] [place], [place] [address]  Phone [phone] Fax [phone]

## 2013-08-03 LAB — RPR: RPR: NONREACTIVE

## 2013-08-03 LAB — TSH: TSH: 1.45 u[IU]/mL (ref 0.450–4.500)

## 2013-08-03 LAB — HTLV-I/II ANTIBODIES, QUAL.: HTLV I/II Ab: NEGATIVE

## 2013-08-03 LAB — ANGIOTENSIN CONVERTING ENZYME: Angio Convert Enzyme: 16 U/L (ref 14–82)

## 2013-08-03 LAB — VITAMIN B12: VITAMIN B 12: 523 pg/mL (ref 211–946)

## 2013-08-03 LAB — ANA W/REFLEX: ANA: NEGATIVE

## 2013-08-04 ENCOUNTER — Telehealth: Payer: Self-pay | Admitting: Neurology

## 2013-08-04 NOTE — Telephone Encounter (Addendum)
Shared lab results with patient per Dr Jannifer Franklin, he verbalized understanding but said that they only received an weeks supply of the Baclofen- 10 mg. Explained to patient's wife  that the refill was sent on 08/01/13, and to check back with pharmacy for prescription and to call back if needed.

## 2013-08-04 NOTE — Telephone Encounter (Signed)
Patient's wife calling to state they received a call from [place] and a message stating to "ask for triage." Please return call to patient and advise.

## 2013-08-04 NOTE — Telephone Encounter (Signed)
Patient's wife returning call to [facility], please return call and advise.

## 2013-08-13 ENCOUNTER — Ambulatory Visit: Payer: PRIVATE HEALTH INSURANCE | Attending: Neurology | Admitting: Physical Therapy

## 2013-08-13 DIAGNOSIS — M6281 Muscle weakness (generalized): Secondary | ICD-10-CM | POA: Insufficient documentation

## 2013-08-13 DIAGNOSIS — IMO0001 Reserved for inherently not codable concepts without codable children: Secondary | ICD-10-CM | POA: Diagnosis present

## 2013-08-13 DIAGNOSIS — R269 Unspecified abnormalities of gait and mobility: Secondary | ICD-10-CM | POA: Diagnosis not present

## 2013-08-19 ENCOUNTER — Ambulatory Visit
Admission: RE | Admit: 2013-08-19 | Discharge: 2013-08-19 | Disposition: A | Discharge status: Home / Self Care | Payer: PRIVATE HEALTH INSURANCE | Source: Ambulatory Visit | Attending: Neurology | Admitting: Neurology

## 2013-08-19 DIAGNOSIS — R269 Unspecified abnormalities of gait and mobility: Secondary | ICD-10-CM

## 2013-08-20 ENCOUNTER — Telehealth: Payer: Self-pay | Admitting: Neurology

## 2013-08-20 NOTE — Telephone Encounter (Signed)
  I called patient. The MRI study of the thoracic spine was unremarkable. The likely etiology of his sudden change in walking in 2013 with an extension of the medullary stroke that was not apparent on MRI of the brain. The walking issues may not be very treatable.   MRI thoracic spine, 08/19/2013:  Impression   Normal MRI thoracic spine (without).

## 2013-08-26 ENCOUNTER — Other Ambulatory Visit: Payer: Self-pay | Admitting: Neurology

## 2013-09-03 ENCOUNTER — Ambulatory Visit (HOSPITAL_COMMUNITY)
Admission: RE | Admit: 2013-09-03 | Discharge: 2013-09-03 | Disposition: A | Discharge status: Home / Self Care | Payer: PRIVATE HEALTH INSURANCE | Source: Ambulatory Visit | Attending: Neurology | Admitting: Neurology

## 2013-09-03 DIAGNOSIS — R269 Unspecified abnormalities of gait and mobility: Secondary | ICD-10-CM | POA: Insufficient documentation

## 2013-09-03 DIAGNOSIS — I10 Essential (primary) hypertension: Secondary | ICD-10-CM | POA: Insufficient documentation

## 2013-09-03 DIAGNOSIS — IMO0001 Reserved for inherently not codable concepts without codable children: Secondary | ICD-10-CM | POA: Diagnosis present

## 2013-09-03 DIAGNOSIS — M6281 Muscle weakness (generalized): Secondary | ICD-10-CM | POA: Insufficient documentation

## 2013-09-03 NOTE — Progress Notes (Addendum)
Physical Therapy Treatment Patient Details  Name: [name] MRN: [id number] Date of Birth: September 03, 1934  Today's Date: 09/03/2013 Time: 0800-0845 [name] Time Calculation (min): 45 min Charge:  There ex [phone]  Visit#: 2 of 16  Re-eval: 09/17/13 Assessment Diagnosis: CVA Prior Therapy: evaluation at neuro  Authorization: [facility] Time Period:    Authorization Visit#: 2 of 10   Subjective: Symptoms/Limitations Symptoms: [name] is a 79 yo male who had a CVA.  He has been evaluated at [facility] Neuro outpatient.  [name] lives in [place] therefore he requested to come to therapy at [facility] How long can you sit comfortably?: no problem How long can you stand comfortably?: needs assist How long can you walk comfortably?: not walking   Precautions/Restrictions   falls  Exercise/Treatments   Aerobic Stationary Bike: nustep L2 hills 3 x 6:00   Standing Other Standing Knee Exercises: standing getting in correct posture and holding no hands x 15-30 seconds x 3 Seated Other Seated Knee Exercises: sit to stand x 5 Other Seated Knee Exercises: heel raises x 10 B Supine Bridges: 5 reps Straight Leg Raises: 5 reps;Both Sidelying Hip ABduction: 5 reps;Both Prone  Hamstring Curl: 5 reps Hip Extension: 5 reps      Physical Therapy Assessment and Plan [name] Assessment and Plan Clinical Impression Statement: [name] is a 79 yo male transferring services from this neuro to this clinic.  [name] has decreased balance, decrased coordination of his mm, decreased activity tolerance and decreased strength.  [name] will benefit from skilled therapy to improve his functional mobility and quality of life.  [name] Plan: Continue balance activity, [name] will benefit from the biodex free step.     Evaluation is in scanner done at Neurological clinic  [place] [name] will Perform Home Exercise Program: For increased strengthening [name] Goal: Perform Home Exercise Program -  Progress: Goal set today [name] Short Term Goals Time to Complete Short Term Goals: 4 weeks (goals are from evaluating therapist transcibed via cindy Elzie Knisley ) [name] Short Term Goal 1: Tolerate standing x 2 minu. with intremittent UE support for improved activity tolerance [name] Short Term Goal 2: ambulate greater than 50' with RW and min A for improved mobiliiy [name] Short Term Goal 3: sit to stand transfers with supervision  [name] Long Term Goals Time to Complete Long Term Goals: 8 weeks [name] Long Term Goal 1: [name] to verbalize fall prevention strategies within home environment [name] Long Term Goal 2: [name] to tolerate 5 min of standing with intermittent UE support and supervision for improved activity tolerance  Long Term Goal 3: [name] to ambulate 10 feet with RW and clse supervision for improved mobility  Long Term Goal 4: [name] to be able to complete sit to stnd transfers with mod I   Problem List Patient Active Problem List   Diagnosis Date Noted  . Abnormality of gait 08/01/2013  . Anemia 12/05/2012  . Early satiety 12/03/2012  . History of colon polyps 12/03/2012  . Facial droop 10/12/2012  . Spinal stenosis of cervical region 07/01/2012  . GERD 05/27/2009  . DYSPHAGIA UNSPECIFIED 05/27/2009  . CHANGE IN BOWELS 05/27/2009  . SMOKER 09/09/2008  . HYPERTENSION 09/09/2008  . CVA 09/09/2008  . CONSTIPATION 09/09/2008  . HIGH BLOOD PRESSURE 07/25/2006    [name] Plan of Care [name] Home Exercise Plan: given   GP Functional Limitation: Mobility: Walking and moving around Mobility: Walking and Moving Around Current Status (V0350): At least 80 percent but  less than 100 percent impaired, limited or restricted Mobility: Walking and Moving Around Goal Status [phone]): At least 40 percent but less than 60 percent impaired, limited or restricted  Cadan Vaughan,CINDY 09/03/2013, 9:57 AM

## 2013-09-11 ENCOUNTER — Ambulatory Visit (HOSPITAL_COMMUNITY)
Admission: RE | Admit: 2013-09-11 | Discharge: 2013-09-11 | Disposition: A | Discharge status: Home / Self Care | Payer: PRIVATE HEALTH INSURANCE | Source: Ambulatory Visit | Attending: Neurology | Admitting: Neurology

## 2013-09-11 DIAGNOSIS — IMO0001 Reserved for inherently not codable concepts without codable children: Secondary | ICD-10-CM | POA: Diagnosis not present

## 2013-09-11 NOTE — Progress Notes (Signed)
Physical Therapy Treatment Patient Details  Name: Caton Popowski MRN: [id number] Date of Birth: 01-28-1934  Today's Date: 09/11/2013 Time: 3646-8032 PT Time Calculation (min): 40 min TE 1224-8250  Visit#: 3 of 16  Re-eval: 09/17/13    Exercise/Treatments Aerobic Stationary Bike: [place] #2, Lv 3, 7' Standing Heel Raises: 2 sets;10 reps;Limitations;Other (comment) (In SITTING) Heel Raises Limitations: Toe Raises, 2x10, in sitting Seated Long Arc Quad: 2 sets;10 reps;Weights Long Arc Quad Weight: 1 lbs. Other Seated Knee Exercises: Sit <-> Stand 2x5 Other Seated Knee Exercises: Marching 2x10, 1# Supine Bridges: 2 sets;5 reps Straight Leg Raises: 2 sets;5 sets;Both Other Supine Knee Exercises: Calm Shells, GTB, 2x10 Sidelying Hip ABduction: 2 sets;5 reps;Both  Physical Therapy Assessment and Plan PT Assessment and Plan Clinical Impression Statement: Initiated POC, with therapuetic exercise in sitting and on table for LE strengthing.  Noted decreased strength on the Lt > Rt, though some coorindation/proprioception issues on the Rt > Lt during OK[place] exercises. Pt was able to complete all exercises, with limited rest breaks between activity, though increased rest breaks required on NuStep secondary to decreased activity tolerance. VC for upright standing during sit <-> stand exercises.  Pt will benefit from skilled therapeutic intervention in order to improve on the following deficits: Decreased activity tolerance;Decreased balance;Decreased strength;Decreased mobility Rehab Potential: Fair PT Plan: Continue balance activity, pt will benefit from the biodex free step.       Problem List Patient Active Problem List   Diagnosis Date Noted  . Abnormality of gait 08/01/2013  . Anemia 12/05/2012  . Early satiety 12/03/2012  . History of colon polyps 12/03/2012  . Facial droop 10/12/2012  . Spinal stenosis of cervical region 07/01/2012  . GERD 05/27/2009  . DYSPHAGIA UNSPECIFIED  05/27/2009  . CHANGE IN BOWELS 05/27/2009  . SMOKER 09/09/2008  . HYPERTENSION 09/09/2008  . CVA 09/09/2008  . CONSTIPATION 09/09/2008  . HIGH BLOOD PRESSURE 07/25/2006    PT - End of Session Equipment Utilized During Treatment: Gait belt Activity Tolerance: Patient tolerated treatment well;Patient limited by fatigue   Estoria Geary 09/11/2013, 3:20 PM

## 2013-09-15 ENCOUNTER — Ambulatory Visit (HOSPITAL_COMMUNITY)
Admission: RE | Admit: 2013-09-15 | Discharge: 2013-09-15 | Disposition: A | Discharge status: Home / Self Care | Payer: PRIVATE HEALTH INSURANCE | Source: Ambulatory Visit | Attending: Neurology | Admitting: Neurology

## 2013-09-15 DIAGNOSIS — IMO0001 Reserved for inherently not codable concepts without codable children: Secondary | ICD-10-CM | POA: Diagnosis not present

## 2013-09-15 NOTE — Progress Notes (Signed)
Physical Therapy Treatment Patient Details  Name: [name] MRN: [id number] Date of Birth: 31-Jan-1934  Today's Date: 09/15/2013 Time: 6812-7517 PT Time Calculation (min): 43 min TE 1302-1335, TA 1335-1345 Visit#: 4 of 16  Re-eval: 09/17/13      Subjective: Symptoms/Limitations Symptoms: No complaints of pain.   Precautions/Restrictions     Exercise/Treatments Aerobic Stationary Bike: NuStep, [place] #2, Lv. 2, 8' 0Standing Heel Raises: 10 reps Heel Raises Limitations: Toe Raises, x10 in standing Gait Training: 3 feet forward/backward with RW, x5 Seated Other Seated Knee Exercises: Sit <-> Stand x5 Supine Bridges: 3 sets;5 reps Straight Leg Raises: 3 sets;5 reps Other Supine Knee Exercises: Clam Shells, GTB, 3x5 with 3" hold Sidelying Hip ABduction: 3 sets;5 reps Prone  Hamstring Curl: 15 reps Hip Extension: 3 sets;5 reps      Physical Therapy Assessment and Plan PT Assessment and Plan Clinical Impression Statement: Transitioned HR/TR to standing today, with tremors noted during TR on the Lt side.  Pt was able to ambulate 6 feet today with min guard, though min guard/min assist required with sit <-> stand exercises secondary to fair immediate standing balance.  Pt will benefit from skilled therapeutic intervention in order to improve on the following deficits: Decreased activity tolerance;Decreased balance;Decreased strength;Decreased mobility Rehab Potential: Fair PT Plan: Re-assessment next visit.     Goals PT Short Term Goals PT Short Term Goal 1: Tolerate standing x 2 minu. with intremittent UE support for improved activity tolerance PT Short Term Goal 1 - Progress: Progressing toward goal PT Short Term Goal 2: ambulate greater than 50' with RW and min A for improved mobiliiy PT Short Term Goal 2 - Progress: Progressing toward goal PT Short Term Goal 3: sit to stand transfers with supervision  PT Short Term Goal 3 - Progress: Progressing toward  goal  Problem List Patient Active Problem List   Diagnosis Date Noted  . Abnormality of gait 08/01/2013  . Anemia 12/05/2012  . Early satiety 12/03/2012  . History of colon polyps 12/03/2012  . Facial droop 10/12/2012  . Spinal stenosis of cervical region 07/01/2012  . GERD 05/27/2009  . DYSPHAGIA UNSPECIFIED 05/27/2009  . CHANGE IN BOWELS 05/27/2009  . SMOKER 09/09/2008  . HYPERTENSION 09/09/2008  . CVA 09/09/2008  . CONSTIPATION 09/09/2008  . HIGH BLOOD PRESSURE 07/25/2006    PT - End of Session Activity Tolerance: Patient tolerated treatment well;Patient limited by fatigue   Shirline Kendle 09/15/2013, 1:59 PM

## 2013-09-18 ENCOUNTER — Ambulatory Visit (HOSPITAL_COMMUNITY)
Admission: RE | Admit: 2013-09-18 | Discharge: 2013-09-18 | Disposition: A | Discharge status: Home / Self Care | Payer: PRIVATE HEALTH INSURANCE | Source: Ambulatory Visit | Attending: Neurology | Admitting: Neurology

## 2013-09-18 DIAGNOSIS — IMO0001 Reserved for inherently not codable concepts without codable children: Secondary | ICD-10-CM | POA: Diagnosis not present

## 2013-09-18 NOTE — Evaluation (Signed)
Physical Therapy Re-Evaluation  Patient Details  Name: [name] MRN: [id number] Date of Birth: 1934/04/10  Today's Date: 09/18/2013 Time: 7616-0737 PT Time Calculation (min): 39 min  TE 1305-1320, MMT 1320-1334, Gait 1335-1344             Visit#: 5 of 24  Re-eval: 10/16/13 Assessment Diagnosis: CVA Next MD Visit: Jannifer Franklin 10/08/13    Past Medical History:  Past Medical History  Diagnosis Date  . Hypertension   . Stroke     Left side weakness.  . Shortness of breath     with activity  . Sleep apnea     does not use CPAP.  Tested maybe 5- 10 years ago  . GERD (gastroesophageal reflux disease)   . Constipation   . Glaucoma   . Abnormality of gait 08/01/2013  . Blindness of left eye     Posttraumatic   Past Surgical History:  Past Surgical History  Procedure Laterality Date  . Hernia repair Right     inguinal  . Cervical fusion    . Anterior cervical decomp/discectomy fusion N/A 07/01/2012    Procedure: ANTERIOR CERVICAL DECOMPRESSION/DISCECTOMY FUSION CERVICAL FIVE-SIX [name] REMOVAL CERVICAL SIX-SEVEN;  Surgeon: [name], MD;  Location: [facility];  Service: Neurosurgery;  Laterality: N/A;  . Esophagogastroduodenoscopy  04/14/2002    TGG:YIRSWNIO'E' ring, status post dilation as described above/Hiatal hernia, focal antral erosions of uncertain clinical significance  . Colonoscopy   08/04/2002      RMR: Normal rectum/Pancolonic diverticula/Colonic polyps as described above, biopsied and/or snared  . Esophagogastroduodenoscopy  06/09/2009    RMR: normal esophagus s/p dilator/small hiatal hernia otherwise normal  . Colonoscopy  60/08/2009    RMR: normal rectum/left and right sided diverticula/multiple colonic polyps. tubular adenomas. surveillance due 2014  . Colonoscopy with esophagogastroduodenoscopy (egd) N/A 12/18/2012    Dr. [name]:  Colonic diverticulosis. Multiple colonic polyps-hyperplastic polyps and tubular adenoma. Friable anal canal hemorrhoids. EGD  with chronic atrophic gastritis    Subjective Symptoms/Limitations Symptoms: No complaints of pain.  "feeling good today" How long can you walk comfortably?: Pt was able to ambulate in clinic with use of RW and min guard for 87 feet.  (was not walking)   Sensation/Coordination/Flexibility/Functional Tests Functional Tests Functional Tests: STS x5 with (B) UE/RW 45.02 seconds  Assessment RLE Strength Right Hip Flexion: 4/5 ((was 4/5)) Right Hip Extension: 3-/5 ((was 2+/5)) Right Hip ABduction: 3+/5 ((was 3-/5)) Right Hip ADduction: 4/5 ((was 4/5)) Right Knee Flexion:  (4+/5 (was 4/5)) Right Knee Extension: 5/5 (9was 5/5)) Right Ankle Dorsiflexion: 5/5 (9was 5/5)) Right Ankle Plantar Flexion: 4/5 ((was 2+/5)) LLE Strength Left Hip Flexion:  (4+/5 (was 5/5)) Left Hip Extension: 3-/5 ((was 2+/5)) Left Hip ABduction: 3+/5 ((was 3-/5)) Left Hip ADduction: 4/5 ((was 4/5)) Left Knee Flexion:  (4+/5 (was 4/5)) Left Knee Extension: 5/5 ((was 5/5)) Left Ankle Dorsiflexion: 5/5 Left Ankle Plantar Flexion:  (4-/5 (was 2+/5))  Exercise/Treatments Mobility/Balance  Static Standing Balance Tandem Stance - Right Leg: 1 Tandem Stance - Left Leg: 1  Supine Straight Leg Raises: 2 sets;10 reps;Both Prone  Hip Extension: 2 sets;10 reps;Both      Physical Therapy Assessment and Plan PT Assessment and Plan Clinical Impression Statement: Re-assessment completed today.  Pt demonstrates improvements in LE on (B) LE, though limitations are present on Rt > Lt side.  Notable deficiets with smooth motion in [place] exercises on (B) LE during bed exercises (related to decreased proximal stability vs. coordination/proprioception issues).  Pt has currently met 2/3  STG, for gait distance and static standing balance with limited UE assist.  Pt was able to complete squat pivot transfer from W/C to bed with supervision, though pt did require min guard and use of RW to complete stand pivot transfer.   Recommend  continued PT 2x/wk for 4 weeks to progress into LTG, and improve functional mobility skills.  Pt did questions attempting to walk at home; educated pt and wife of family education to be provided next week on how to assist with walking in the home for safe mobility skills.  Pt will benefit from skilled therapeutic intervention in order to improve on the following deficits: Decreased activity tolerance;Decreased balance;Decreased strength;Decreased mobility Rehab Potential: Fair PT Frequency: Min 2X/week PT Duration: 4 weeks PT Plan: Family to be present for upcoming treatment session to assist with walking some around the home; teach wife how to stand by pt (using gait belt) for safe mobility.  Work on stand pivot transfers (vs. squat pivot transfers) using RW as needed.     Goals PT Short Term Goals PT Short Term Goal 1: Tolerate standing x 2 minu. with intremittent UE support for improved activity tolerance PT Short Term Goal 1 - Progress: Met PT Short Term Goal 2: ambulate greater than 16' with RW and min A for improved mobility PT Short Term Goal 2 - Progress: Met PT Short Term Goal 3: sit to stand transfers with supervision  PT Short Term Goal 3 - Progress: Progressing toward goal (Able to complete transfer with squat pivot transfer with supervision) PT Long Term Goals Time to Complete Long Term Goals: 8 weeks PT Long Term Goal 1: Pt to verbalize fall prevention strategies within home environment PT Long Term Goal 1 - Progress: Progressing toward goal PT Long Term Goal 2: Pt to tolerate 5 min of standing with intermittent UE support and supervision for improved activity tolerance  PT Long Term Goal 2 - Progress: Progressing toward goal Long Term Goal 3: Pt to ambulate 10 feet with RW and clse supervision for improved mobility  Long Term Goal 3 Progress: Progressing toward goal Long Term Goal 4: Pt to be able to complete sit to stnd transfers with mod I  Long Term Goal 4 Progress:  Progressing toward goal  Problem List Patient Active Problem List   Diagnosis Date Noted  . Abnormality of gait 08/01/2013  . Anemia 12/05/2012  . Early satiety 12/03/2012  . History of colon polyps 12/03/2012  . Facial droop 10/12/2012  . Spinal stenosis of cervical region 07/01/2012  . GERD 05/27/2009  . DYSPHAGIA UNSPECIFIED 05/27/2009  . CHANGE IN BOWELS 05/27/2009  . SMOKER 09/09/2008  . HYPERTENSION 09/09/2008  . CVA 09/09/2008  . CONSTIPATION 09/09/2008  . HIGH BLOOD PRESSURE 07/25/2006    PT - End of Session Equipment Utilized During Treatment: Gait belt Activity Tolerance: Patient tolerated treatment well;Patient limited by fatigue General Behavior During Therapy: [facility] for tasks assessed/performed   , 09/18/2013, 4:53 PM  Physician Documentation Your signature is required to indicate approval of the treatment plan as stated above.  Please sign and either send electronically or make a copy of this report for your files and return this physician signed original.   Please mark one 1.__approve of plan  2. ___approve of plan with the following conditions.   ______________________________  _____________________ Physician Signature                                                                                                             Date

## 2013-09-22 ENCOUNTER — Ambulatory Visit (HOSPITAL_COMMUNITY)
Admission: RE | Admit: 2013-09-22 | Discharge: 2013-09-22 | Disposition: A | Discharge status: Home / Self Care | Payer: PRIVATE HEALTH INSURANCE | Source: Ambulatory Visit | Attending: Neurology | Admitting: Neurology

## 2013-09-22 DIAGNOSIS — IMO0001 Reserved for inherently not codable concepts without codable children: Secondary | ICD-10-CM | POA: Diagnosis not present

## 2013-09-22 NOTE — Progress Notes (Signed)
Physical Therapy Treatment Patient Details  Name: [name] MRN: [id number] Date of Birth: [birth year]  Today's Date: 09/22/2013 Time: 1350-1450 PT Time Calculation (min): 60 min  Visit#: 6 of 24  Re-eval: 10/16/13 Authorization: [employer] medicare  Authorization Visit#: 6 of 10  Charges:  Gait 1350-1415 (25'), therex 1415-1440 (25')   Subjective: Pt reports pain in his Lt hip when he walks.  States its more of a soreness not really a pain.     Exercise/Treatments Aerobic Stationary Bike: NuStep, [place] #2, Lv. 2, 8' Standing Gait Training: [phone], 180'X1 Supine Bridges: 2 sets;10 reps Straight Leg Raises: 2 sets;10 reps;Both Sidelying Hip ABduction: 2 sets;10 reps Prone  Hamstring Curl: 2 sets;10 reps Hip Extension: 2 sets;10 reps;Both      Physical Therapy Assessment and Plan PT Assessment and Plan Clinical Impression Statement: Wife present for session today.  Educated patient and spouse on safetly with ambulation and transfers.  Both verbalized understanding.  Urged patient to increase his activity level at home and to walk more often during the day.  Wife reports patient is scared but she is comfortable with assisting him with ambulation.  Patient able to increase ambulation distance today. Noted weakness with repeated repititions with therex.  PT Plan: Continue to progress stability via standing exercises, activity tolerance and  improve overall confidence in ambulation.  Pt to continue mat activities with HEP; progress standing and functional actvities.   Problem List Patient Active Problem List   Diagnosis Date Noted  . Abnormality of gait 08/01/2013  . Anemia 12/05/2012  . Early satiety 12/03/2012  . History of colon polyps 12/03/2012  . Facial droop 10/12/2012  . Spinal stenosis of cervical region 07/01/2012  . GERD 05/27/2009  . DYSPHAGIA UNSPECIFIED 05/27/2009  . CHANGE IN BOWELS 05/27/2009  . SMOKER 09/09/2008  . HYPERTENSION 09/09/2008  . CVA 09/09/2008   . CONSTIPATION 09/09/2008  . HIGH BLOOD PRESSURE 07/25/2006    PT - End of Session Equipment Utilized During Treatment: Gait belt Activity Tolerance: Patient tolerated treatment well;Patient limited by fatigue General Behavior During Therapy: [facility] for tasks assessed/performed  GP    Teena Irani, PTA/CLT 09/22/2013, 5:03 PM

## 2013-09-25 ENCOUNTER — Ambulatory Visit (HOSPITAL_COMMUNITY)
Admission: RE | Admit: 2013-09-25 | Discharge: 2013-09-25 | Disposition: A | Discharge status: Home / Self Care | Payer: PRIVATE HEALTH INSURANCE | Source: Ambulatory Visit | Attending: Neurology | Admitting: Neurology

## 2013-09-25 DIAGNOSIS — IMO0001 Reserved for inherently not codable concepts without codable children: Secondary | ICD-10-CM | POA: Diagnosis not present

## 2013-09-25 NOTE — Progress Notes (Signed)
Physical Therapy Treatment Patient Details  Name: [name] MRN: [id number] Date of Birth: May 08, 1934  Today's Date: 09/25/2013 Time: 1300-1340 PT Time Calculation (min): 40 min Charge there activity 1300-1330; gt 1330-1340  Visit#: 7 of 24  Re-eval: 10/16/13   Authorization Time Period:    Authorization Visit#: 7 of 10   Subjective: Symptoms/Limitations Symptoms: Pt states that he has been doing his exercises at night.   Exercise/Treatments      Standing Gait Training: 226 feet with one rest break Other Standing Knee Exercises: marching x 10  Other Standing Knee Exercises: tall kneeling 2 RT on mat for forward, retro and sidestep  Seated Other Seated Knee Exercises: sit to stand x 10    Prone  Hamstring Curl: 10 reps;Limitations Hamstring Curl Limitations: 3# B Hip Extension: 10 reps Other Prone Exercises: quadriped = crawling forward and backward on mat x 2  Other Prone Exercises: quadriped shifting wt from arms to legs after each RT of the quadriped exercises for rest.     Physical Therapy Assessment and Plan PT Assessment and Plan Clinical Impression Statement: Pt worked on balance and hip stability with tall kneeling and quadriped exercises.  Pt HR increased to 104 ( 100 being 70% of max) during treatment.  Pt able to tolerate walking for a longer distance today prior to resting.  PT Plan: Continue to progress stability via standing exercises, activity tolerance and  improve overall confidence in ambulation. Monitor HR with treatment.         Problem List Patient Active Problem List   Diagnosis Date Noted  . Abnormality of gait 08/01/2013  . Anemia 12/05/2012  . Early satiety 12/03/2012  . History of colon polyps 12/03/2012  . Facial droop 10/12/2012  . Spinal stenosis of cervical region 07/01/2012  . GERD 05/27/2009  . DYSPHAGIA UNSPECIFIED 05/27/2009  . CHANGE IN BOWELS 05/27/2009  . SMOKER 09/09/2008  . HYPERTENSION 09/09/2008  . CVA 09/09/2008  .  CONSTIPATION 09/09/2008  . HIGH BLOOD PRESSURE 07/25/2006       GP    Ramie Palladino,CINDY 09/25/2013, 1:50 PM

## 2013-09-29 ENCOUNTER — Ambulatory Visit (HOSPITAL_COMMUNITY)
Admission: RE | Admit: 2013-09-29 | Discharge: 2013-09-29 | Disposition: A | Discharge status: Home / Self Care | Payer: PRIVATE HEALTH INSURANCE | Source: Ambulatory Visit | Attending: Neurology | Admitting: Neurology

## 2013-09-29 DIAGNOSIS — IMO0001 Reserved for inherently not codable concepts without codable children: Secondary | ICD-10-CM | POA: Diagnosis not present

## 2013-09-29 NOTE — Progress Notes (Signed)
Physical Therapy Treatment Patient Details  Name: [name] MRN: [id number] Date of Birth: 09/10/1934  Today's Date: 09/29/2013 Time: 1300-1400 PT Time Calculation (min): 60 min Visit#: 8 of 24  Re-eval: 10/16/13 Authorization Visit#: 8 of 10  Charges:  therex 50  Subjective: Symptoms/Limitations Symptoms: Pt states he feels he is getting stronger.  STates his prone exericses are most dificult to complete at home.   Exercise/Treatments Aerobic Stationary Bike: NuStep, [place] #2, Lv. 2, 8' Standing Gait Training: 226 feet with RW  no rest break Other Standing Knee Exercises: marching x 10  Other Standing Knee Exercises: tall kneeling 1 RT on mat for forward, retro and sidestep  Seated Other Seated Knee Exercises: sit to stand x 10 no UE's standard chair  Prone  Hamstring Curl: 2 sets;10 reps Hamstring Curl Limitations: 3# B Hip Extension: 2 sets;10 reps Other Prone Exercises: quadriped = alt UE's 3X, alt LE's 3X      Physical Therapy Assessment and Plan PT Assessment and Plan Clinical Impression Statement: Pt able to complete all actvities with short rest breaks between each.  Able to increase to 2 sets of therex in prone.  Pt unable to complete prone exercises at home due to difficulty.   Retro ambulation in tall kneeling most difficult for patient to complete with noted hip and hamstring weakness.  Added alternating UE/LE in quadriped as well to increase trunk stablity.   PT Plan: Continue to progress stability via standing exercises, activity tolerance and  improve overall confidence in ambulation. Monitor HR with treatment.      Problem List Patient Active Problem List   Diagnosis Date Noted  . Abnormality of gait 08/01/2013  . Anemia 12/05/2012  . Early satiety 12/03/2012  . History of colon polyps 12/03/2012  . Facial droop 10/12/2012  . Spinal stenosis of cervical region 07/01/2012  . GERD 05/27/2009  . DYSPHAGIA UNSPECIFIED 05/27/2009  . CHANGE IN BOWELS  05/27/2009  . SMOKER 09/09/2008  . HYPERTENSION 09/09/2008  . CVA 09/09/2008  . CONSTIPATION 09/09/2008  . HIGH BLOOD PRESSURE 07/25/2006       Teena Irani, PTA/CLT 09/29/2013, 2:00 PM

## 2013-10-02 ENCOUNTER — Ambulatory Visit (HOSPITAL_COMMUNITY)
Admission: RE | Admit: 2013-10-02 | Discharge: 2013-10-02 | Disposition: A | Discharge status: Home / Self Care | Payer: PRIVATE HEALTH INSURANCE | Source: Ambulatory Visit | Attending: Neurology | Admitting: Neurology

## 2013-10-02 DIAGNOSIS — Z5189 Encounter for other specified aftercare: Secondary | ICD-10-CM | POA: Insufficient documentation

## 2013-10-02 DIAGNOSIS — I10 Essential (primary) hypertension: Secondary | ICD-10-CM | POA: Insufficient documentation

## 2013-10-02 DIAGNOSIS — M6281 Muscle weakness (generalized): Secondary | ICD-10-CM | POA: Diagnosis not present

## 2013-10-02 DIAGNOSIS — R269 Unspecified abnormalities of gait and mobility: Secondary | ICD-10-CM | POA: Diagnosis not present

## 2013-10-02 NOTE — Progress Notes (Signed)
Physical Therapy Treatment Patient Details  Name: [name] MRN: [id number] Date of Birth: October 15, 1934  Today's Date: 10/02/2013 Time: 1300-1340 PT Time Calculation (min): 40 min Charge:  There ex 1300-1340  Visit#: 9 of 10  Re-eval: 10/16/13    Authorization: [employer] medicare  Authorization Visit#: 9 of 10   Subjective: Symptoms/Limitations Symptoms: Pt states that he has not ate today secondary to having to go and give blood after therapy today.  Asks therapist to be easy on him.   At end of treatment pt states that he wants today to be his last day as he has so many MD appointments.  Pt states he feels therapy has gotten him to a point where he and his wife can take over.    Exercise/Treatments Standing Heel Raises: 10 reps Functional Squat: 10 reps Gait Training: 226 feet with RW  no rest break Other Standing Knee Exercises: weight shifting, abduction x 2 sets of 5  Other Standing Knee Exercises: hip extension x 5 ; marching x 5 Seated Other Seated Knee Exercises: sit to stand x 10       Physical Therapy Assessment and Plan PT Assessment and Plan Clinical Impression Statement: Pt needs therapist facilitation to maintain balance and correct form with exercises.  Therapist encouraged pt to come one last treatment so that he could be formally re-assessed and given an updated exercise program.   PT Plan: reassess next treatment and give new HEP         Problem List Patient Active Problem List   Diagnosis Date Noted  . Abnormality of gait 08/01/2013  . Anemia 12/05/2012  . Early satiety 12/03/2012  . History of colon polyps 12/03/2012  . Facial droop 10/12/2012  . Spinal stenosis of cervical region 07/01/2012  . GERD 05/27/2009  . DYSPHAGIA UNSPECIFIED 05/27/2009  . CHANGE IN BOWELS 05/27/2009  . SMOKER 09/09/2008  . HYPERTENSION 09/09/2008  . CVA 09/09/2008  . CONSTIPATION 09/09/2008  . HIGH BLOOD PRESSURE 07/25/2006       GP    RUSSELL,CINDY 10/02/2013,  2:13 PM

## 2013-10-08 ENCOUNTER — Ambulatory Visit (HOSPITAL_COMMUNITY)
Admission: RE | Admit: 2013-10-08 | Discharge: 2013-10-08 | Disposition: A | Discharge status: Home / Self Care | Payer: PRIVATE HEALTH INSURANCE | Source: Ambulatory Visit | Attending: Neurology | Admitting: Neurology

## 2013-10-08 DIAGNOSIS — Z5189 Encounter for other specified aftercare: Secondary | ICD-10-CM | POA: Diagnosis not present

## 2013-10-08 NOTE — Progress Notes (Signed)
Physical Therapy Re-evaluation/Treatment Note/Discharge summary  Patient Details  Name: [name] MRN: [id number] Date of Birth: [birth year]  Today's Date: 10/08/2013 Time: 7619-5093 PT Time Calculation (min): 50 min Charge: MMT 1432-1445, Gait 1445-1455, TE 2671-2458              Visit#: 10 of 10  Re-eval: 10/16/13 Assessment Diagnosis: CVA Next MD Visit: Jannifer Franklin 12/08/2013  Authorization: [employer] medicare    Authorization Time Period:    Authorization Visit#: 10 of 10   Subjective Symptoms/Limitations Symptoms: Pt stated he is sore today following exercises done at home.  Compliant wtih HEP 2x a week.  Pt and wife reported noted improved balance and safe mechnaics walking.  Has been walking around the house and reports no falls in over a year.  Pt preparing to move next week and feel confident completeing exercises at home and ready for discharge. How long can you sit comfortably?: no problem How long can you stand comfortably?: 5-10 minutes HHA with and without assistance (needs assist) How long can you walk comfortably?: Has been walking 4 laps (50 feet) around apt with wife assistnace 3-4x a week.(last re-eval Pt was able to ambulate in clinic with use of RW and min guard for 87 feet.  (was not walking)) Pain Assessment Currently in Pain?: No/denies  Objective:   Sensation/Coordination/Flexibility/Functional Tests Functional Tests Functional Tests: foto 51 was 38 Functional Tests: STS 5x in 31" was 45"  Assessment RLE Strength Right Hip Flexion:  (4+/5 was 4/5) Right Hip Extension: 3/5 (was 3-/5) Right Hip ABduction: 4/5 (was 3+/5) Right Hip ADduction: 4/5 (was 4/5) Right Knee Flexion: 5/5 (was 4/5) Right Knee Extension: 5/5 Right Ankle Dorsiflexion: 5/5 Right Ankle Plantar Flexion:  (was 4/5) LLE Strength Left Hip Flexion:  (4+/5 was 5/5) Left Hip Extension: 3/5 (was 3-/5) Left Hip ABduction: 4/5 (was 3+/5) Left Hip ADduction: 4/5 (was 4/5) Left Knee Flexion: 5/5  (was 4+/5) Left Knee Extension: 5/5 Left Ankle Dorsiflexion: 5/5 Left Ankle Plantar Flexion:  (was 4-/5)  Exercise/Treatments Standing Heel Raises: 10 reps Heel Raises Limitations: Toe Raises, x10 in standing Forward Lunges: Both;10 reps Functional Squat: 10 reps Gait Training: 226x 3 RT with RW SBA Seated Other Seated Knee Exercises: sit to stand x 10 2 sets    Physical Therapy Assessment and Plan PT Assessment and Plan Clinical Impression Statement: Reassessment complete with the following findings:  Pt reports compliance with HEP 2x a week.  Reports he has been walking daily with RW around the house.  Stated no falls in over a year.  Able to demonstrate 5 minutes standing with no HHA and no LOB episodes.  Improved LE strength and activity tolerance with abiltiy to ambulate for 5'30" with RW SBA before limited by fatigue.  Reviewed goals, recommend pt to continue wtih OPPT to improve strength, activity tolerance and gait.  Pt statedhe was ready for discharge, plans to move next week and has several MD apts, feels confident to complete at home.  Pt given advanced HEP printout progressed to closed chain strengthening acttivities.  Pt able to demosntrate appropriate technqique with all new exercises safely.   PT Plan: D/C to HEP per pt request.      Goals Home Exercise Program PT Goal: Perform Home Exercise Program - Progress: Met PT Short Term Goals PT Short Term Goal 1: Tolerate standing x 2 minu. with intremittent UE support for improved activity tolerance PT Short Term Goal 1 - Progress: Met PT Short Term Goal 2: ambulate greater than  55' with RW and min A for improved mobility PT Short Term Goal 2 - Progress: Met PT Short Term Goal 3: sit to stand transfers with supervision  PT Short Term Goal 3 - Progress: Met PT Long Term Goals PT Long Term Goal 1: Pt to verbalize fall prevention strategies within home environment PT Long Term Goal 1 - Progress: Met (Verbalized no fall in over  a year) PT Long Term Goal 2: Pt to tolerate 5 min of standing with intermittent UE support and supervision for improved activity tolerance  PT Long Term Goal 2 - Progress: Met Long Term Goal 3: Pt to ambulate 10 feet with RW and clse supervision for improved mobility  Long Term Goal 3 Progress: Progressing toward goal (5:26 completed 250 feet prior rest break) Long Term Goal 4: Pt to be able to complete sit to stnd transfers with mod I  Long Term Goal 4 Progress: Met  Problem List Patient Active Problem List   Diagnosis Date Noted  . Abnormality of gait 08/01/2013  . Anemia 12/05/2012  . Early satiety 12/03/2012  . History of colon polyps 12/03/2012  . Facial droop 10/12/2012  . Spinal stenosis of cervical region 07/01/2012  . GERD 05/27/2009  . DYSPHAGIA UNSPECIFIED 05/27/2009  . CHANGE IN BOWELS 05/27/2009  . SMOKER 09/09/2008  . HYPERTENSION 09/09/2008  . CVA 09/09/2008  . CONSTIPATION 09/09/2008  . HIGH BLOOD PRESSURE 07/25/2006    PT - End of Session Equipment Utilized During Treatment: Gait belt Activity Tolerance: Patient tolerated treatment well;Patient limited by fatigue General Behavior During Therapy: [facility] for tasks assessed/performed PT Plan of Care PT Home Exercise Plan: given.    GP Functional Assessment Tool Used: FOTO 51% was 38% Functional Limitation: Mobility: Walking and moving around Mobility: Walking and Moving Around Goal Status [phone]): At least 40 percent but less than 60 percent impaired, limited or restricted Mobility: Walking and Moving Around Discharge Status [phone]): At least 40 percent but less than 60 percent impaired, limited or restricted  Aldona Lento 10/08/2013, 4:30 PM  Physician Documentation Your signature is required to indicate approval of the treatment plan as stated above.  Please sign and either send electronically or make a copy of this report for your files and return this physician signed original.   Please mark one  1.__approve of plan  2. ___approve of plan with the following conditions.   ______________________________                                                          _____________________ Physician Signature                                                                                                             Date

## 2013-11-19 ENCOUNTER — Encounter: Payer: Self-pay | Admitting: Neurology

## 2013-11-25 ENCOUNTER — Encounter: Payer: Self-pay | Admitting: Neurology

## 2013-12-08 ENCOUNTER — Ambulatory Visit: Payer: PRIVATE HEALTH INSURANCE | Admitting: Neurology

## 2013-12-23 ENCOUNTER — Other Ambulatory Visit: Payer: Self-pay | Admitting: Neurology

## 2013-12-30 ENCOUNTER — Ambulatory Visit (HOSPITAL_COMMUNITY)
Admission: RE | Admit: 2013-12-30 | Discharge: 2013-12-30 | Disposition: A | Discharge status: Home / Self Care | Payer: PRIVATE HEALTH INSURANCE | Source: Ambulatory Visit | Attending: Family Medicine | Admitting: Family Medicine

## 2013-12-30 ENCOUNTER — Other Ambulatory Visit (HOSPITAL_COMMUNITY): Payer: Self-pay | Admitting: Family Medicine

## 2013-12-30 DIAGNOSIS — R05 Cough: Secondary | ICD-10-CM | POA: Insufficient documentation

## 2013-12-30 DIAGNOSIS — R0989 Other specified symptoms and signs involving the circulatory and respiratory systems: Secondary | ICD-10-CM | POA: Insufficient documentation

## 2013-12-30 DIAGNOSIS — J209 Acute bronchitis, unspecified: Secondary | ICD-10-CM

## 2014-02-06 ENCOUNTER — Ambulatory Visit (HOSPITAL_COMMUNITY)
Admission: RE | Admit: 2014-02-06 | Discharge: 2014-02-06 | Disposition: A | Discharge status: Home / Self Care | Payer: Medicare Other | Source: Ambulatory Visit | Attending: Family Medicine | Admitting: Family Medicine

## 2014-02-06 ENCOUNTER — Other Ambulatory Visit (HOSPITAL_COMMUNITY): Payer: Self-pay | Admitting: Family Medicine

## 2014-02-06 DIAGNOSIS — J42 Unspecified chronic bronchitis: Secondary | ICD-10-CM | POA: Diagnosis not present

## 2014-02-06 DIAGNOSIS — J410 Simple chronic bronchitis: Secondary | ICD-10-CM

## 2014-04-01 NOTE — H&P (Signed)
  NTS SOAP Note  Vital Signs:  Vitals as of: 9/67/8938: Systolic 101: Diastolic 62: Heart Rate 89: Temp 24F: Height 48ft 9in: Weight 168Lbs 0 Ounces: BMI 24.81  BMI : 24.81 kg/m2  Subjective: This 80 Years  old Male presents for of a left inguinal hernia.  Has been having swelling in left groin region, made worse when he coughs or transfers to wheelchair.  Reduces when lying back.  No pain or discomfort noted.  Has been present for a few months.;  Review of Symptoms:  Constitutional:  fatigue, weakness Head:unremarkable    Eyes:  blurred vision bilateral sinus problems Cardiovascular:  unremarkable   Respiratory:  dyspnea, wheezing, cough Gastrointestinal:  unremarkable   Genitourinary:unremarkable       back pain Skin:unremarkable Hematolgic/Lymphatic:unremarkable     Allergic/Immunologic:unremarkable     Past Medical History:    Reviewed  Past Medical History  Surgical History: RIH in remote past Medical Problems: CVA, GERD, HTN Allergies: asa Medications: metoclopramide, baby ASA, vesicare, potassium, HCTZ, lisinopril, oeprazole, amitiza   Social History:Reviewed  Social History  Preferred Language: English Race:  Black or African American Ethnicity: Not Hispanic / Latino Age: 70 Years 8 Months Marital Status:  M Alcohol: no   Smoking Status: Current every day smoker reviewed on 03/07/2014 Started Date:  Packs per day: 1.00 Functional Status reviewed on 03/07/2014 ------------------------------------------------ Bathing: Normal Cooking: Normal Dressing: Normal Driving: Normal Eating: Normal Managing Meds: Normal Oral Care: Normal Shopping: Normal Toileting: Normal Transferring: Disablilty - with difficulty Walking: Disablilty - uses wheelchair primarily Cognitive Status reviewed on 03/07/2014 ------------------------------------------------ Attention: Normal Decision Making: Normal Language:  Normal Memory: Normal Motor: Normal Perception: Normal Problem Solving: Normal Visual and Spatial: Normal   Family History:  Reviewed  Family Health History Family History is Unknown    Objective Information:   Wheelchair usage, but able to get up on exam table Neck:  Supple without lymphadenopathy.  Heart:  RRR, no murmur     No wheezing appreciated.  Clear bilaterally Abdomen:Soft, NT/ND, no HSM, no masses.  Reducible left inguinal hernia noted. BP:[id number]    Assessment:Left inguinal hernia  Diagnoses: 550.90 Inguinal hernia (Unilateral inguinal hernia, without obstruction or gangrene, not specified as recurrent)  Procedures: 23536 - OFFICE OUTPATIENT NEW 20 MINUTES    Plan:  Patient scheduled for left inguinal herniorrhaphy with mesh on 04/15/2014. The risks and benefits of the procedure including bleeding, infection, mesh use, and the possibility of recurrence of the hernia were fully explained to the patient, who gave informed consent.

## 2014-04-08 NOTE — Patient Instructions (Signed)
[name]  04/08/2014   Your procedure is scheduled on:  04/15/2014  Report to [facility] at  700  AM.  Call this number if you have problems the morning of surgery: [phone]   Remember:   Do not eat food or drink liquids after midnight.   Take these medicines the morning of surgery with A SIP OF WATER:  Lisinopril, reglan, prilosec, vesicare   Do not wear jewelry, make-up or nail polish.  Do not wear lotions, powders, or perfumes.   Do not shave 48 hours prior to surgery. Men may shave face and neck.  Do not bring valuables to the hospital.  [facility] is not responsible for any belongings or valuables.               Contacts, dentures or bridgework may not be worn into surgery.  Leave suitcase in the car. After surgery it may be brought to your room.  For patients admitted to the hospital, discharge time is determined by your treatment team.               Patients discharged the day of surgery will not be allowed to drive home.  Name and phone number of your driver: family  Special Instructions: Shower using CHG 2 nights before surgery and the night before surgery.  If you shower the day of surgery use CHG.  Use special wash - you have one bottle of CHG for all showers.  You should use approximately 1/3 of the bottle for each shower.   Please read over the following fact sheets that you were given: Pain Booklet, Coughing and Deep Breathing, Surgical Site Infection Prevention, Anesthesia Post-op Instructions and Care and Recovery After Surgery Hernia A hernia occurs when an internal organ pushes out through a weak spot in the abdominal wall. Hernias most commonly occur in the groin and around the navel. Hernias often can be pushed back into place (reduced). Most hernias tend to get worse over time. Some abdominal hernias can get stuck in the opening (irreducible or incarcerated hernia) and cannot be reduced. An irreducible abdominal hernia which is tightly squeezed into the  opening is at risk for impaired blood supply (strangulated hernia). A strangulated hernia is a medical emergency. Because of the risk for an irreducible or strangulated hernia, surgery may be recommended to repair a hernia. CAUSES   Heavy lifting.  Prolonged coughing.  Straining to have a bowel movement.  A cut (incision) made during an abdominal surgery. HOME CARE INSTRUCTIONS   Bed rest is not required. You may continue your normal activities.  Avoid lifting more than 10 pounds (4.5 kg) or straining.  Cough gently. If you are a smoker it is best to stop. Even the best hernia repair can break down with the continual strain of coughing. Even if you do not have your hernia repaired, a cough will continue to aggravate the problem.  Do not wear anything tight over your hernia. Do not try to keep it in with an outside bandage or truss. These can damage abdominal contents if they are trapped within the hernia sac.  Eat a normal diet.  Avoid constipation. Straining over long periods of time will increase hernia size and encourage breakdown of repairs. If you cannot do this with diet alone, stool softeners may be used. SEEK IMMEDIATE MEDICAL CARE IF:   You have a fever.  You develop increasing abdominal pain.  You feel nauseous or vomit.  Your hernia  is stuck outside the abdomen, looks discolored, feels hard, or is tender.  You have any changes in your bowel habits or in the hernia that are unusual for you.  You have increased pain or swelling around the hernia.  You cannot push the hernia back in place by applying gentle pressure while lying down. MAKE SURE YOU:   Understand these instructions.  Will watch your condition.  Will get help right away if you are not doing well or get worse. Document Released: 12/19/2004 Document Revised: 03/13/2011 Document Reviewed: 08/08/2007 [facility] Patient Information 2015 [place], [place]. This information is not intended to replace advice  given to you by your health care provider. Make sure you discuss any questions you have with your health care provider. PATIENT INSTRUCTIONS POST-ANESTHESIA  IMMEDIATELY FOLLOWING SURGERY:  Do not drive or operate machinery for the first twenty four hours after surgery.  Do not make any important decisions for twenty four hours after surgery or while taking narcotic pain medications or sedatives.  If you develop intractable nausea and vomiting or a severe headache please notify your doctor immediately.  FOLLOW-UP:  Please make an appointment with your surgeon as instructed. You do not need to follow up with anesthesia unless specifically instructed to do so.  WOUND CARE INSTRUCTIONS (if applicable):  Keep a dry clean dressing on the anesthesia/puncture wound site if there is drainage.  Once the wound has quit draining you may leave it open to air.  Generally you should leave the bandage intact for twenty four hours unless there is drainage.  If the epidural site drains for more than 36-48 hours please call the anesthesia department.  QUESTIONS?:  Please feel free to call your physician or the hospital operator if you have any questions, and they will be happy to assist you.

## 2014-04-10 ENCOUNTER — Other Ambulatory Visit: Payer: Self-pay

## 2014-04-10 ENCOUNTER — Encounter (HOSPITAL_COMMUNITY): Admission: RE | Admit: 2014-04-10 | Payer: Medicare Other | Source: Ambulatory Visit | Admitting: General Surgery

## 2014-04-10 ENCOUNTER — Encounter (HOSPITAL_COMMUNITY)
Admission: RE | Admit: 2014-04-10 | Discharge: 2014-04-10 | Disposition: A | Discharge status: Home / Self Care | Payer: Medicare Other | Source: Ambulatory Visit | Attending: General Surgery | Admitting: General Surgery

## 2014-04-10 ENCOUNTER — Encounter (HOSPITAL_COMMUNITY): Payer: Self-pay

## 2014-04-10 ENCOUNTER — Encounter (HOSPITAL_COMMUNITY)
Admission: RE | Admit: 2014-04-10 | Disposition: A | Discharge status: Home / Self Care | Payer: Medicare Other | Source: Ambulatory Visit | Attending: General Surgery | Admitting: General Surgery

## 2014-04-10 HISTORY — DX: Unspecified osteoarthritis, unspecified site: M19.90

## 2014-04-10 LAB — BASIC METABOLIC PANEL
Anion gap: 9 (ref 5–15)
BUN: 14 mg/dL (ref 6–23)
CHLORIDE: 107 mmol/L (ref 96–112)
CO2: 25 mmol/L (ref 19–32)
Calcium: 9.3 mg/dL (ref 8.4–10.5)
Creatinine, Ser: 1.12 mg/dL (ref 0.50–1.35)
GFR calc Af Amer: 71 mL/min — ABNORMAL LOW (ref >90)
GFR calc non Af Amer: 61 mL/min — ABNORMAL LOW (ref >90)
Glucose, Bld: 119 mg/dL — ABNORMAL HIGH (ref 70–99)
POTASSIUM: 4.2 mmol/L (ref 3.5–5.1)
Sodium: 141 mmol/L (ref 135–145)

## 2014-04-10 LAB — CBC WITH DIFFERENTIAL/PLATELET
Basophils Absolute: 0 10*3/uL (ref 0.0–0.1)
Basophils Relative: 1 % (ref 0–1)
EOS PCT: 6 % — AB (ref 0–5)
Eosinophils Absolute: 0.4 10*3/uL (ref 0.0–0.7)
HEMATOCRIT: 42.1 % (ref 39.0–52.0)
Hemoglobin: 14.2 g/dL (ref 13.0–17.0)
Lymphocytes Relative: 44 % (ref 12–46)
Lymphs Abs: 3 10*3/uL (ref 0.7–4.0)
MCH: 31.5 pg (ref 26.0–34.0)
MCHC: 33.7 g/dL (ref 30.0–36.0)
MCV: 93.3 fL (ref 78.0–100.0)
MONO ABS: 0.6 10*3/uL (ref 0.1–1.0)
Monocytes Relative: 9 % (ref 3–12)
Neutro Abs: 2.6 10*3/uL (ref 1.7–7.7)
Neutrophils Relative %: 40 % — ABNORMAL LOW (ref 43–77)
Platelets: 178 10*3/uL (ref 150–400)
RBC: 4.51 MIL/uL (ref 4.22–5.81)
RDW: 13.5 % (ref 11.5–15.5)
WBC: 6.6 10*3/uL (ref 4.0–10.5)

## 2014-04-10 NOTE — Pre-Procedure Instructions (Signed)
Patient given information to sign up for my chart at home. 

## 2014-04-15 ENCOUNTER — Encounter (HOSPITAL_COMMUNITY): Payer: Self-pay | Admitting: *Deleted

## 2014-04-15 ENCOUNTER — Ambulatory Visit (HOSPITAL_COMMUNITY): Payer: Medicare Other | Admitting: Certified Registered Nurse Anesthetist

## 2014-04-15 ENCOUNTER — Ambulatory Visit (HOSPITAL_COMMUNITY)
Admission: RE | Admit: 2014-04-15 | Discharge: 2014-04-15 | Disposition: A | Discharge status: Home / Self Care | Payer: Medicare Other | Source: Ambulatory Visit | Attending: General Surgery | Admitting: General Surgery

## 2014-04-15 ENCOUNTER — Encounter (HOSPITAL_COMMUNITY)
Admission: RE | Disposition: A | Discharge status: Home / Self Care | Payer: Self-pay | Source: Ambulatory Visit | Attending: General Surgery

## 2014-04-15 DIAGNOSIS — K4090 Unilateral inguinal hernia, without obstruction or gangrene, not specified as recurrent: Secondary | ICD-10-CM | POA: Diagnosis present

## 2014-04-15 DIAGNOSIS — K219 Gastro-esophageal reflux disease without esophagitis: Secondary | ICD-10-CM | POA: Insufficient documentation

## 2014-04-15 DIAGNOSIS — I10 Essential (primary) hypertension: Secondary | ICD-10-CM | POA: Diagnosis not present

## 2014-04-15 DIAGNOSIS — F17210 Nicotine dependence, cigarettes, uncomplicated: Secondary | ICD-10-CM | POA: Insufficient documentation

## 2014-04-15 DIAGNOSIS — Z8673 Personal history of transient ischemic attack (TIA), and cerebral infarction without residual deficits: Secondary | ICD-10-CM | POA: Insufficient documentation

## 2014-04-15 DIAGNOSIS — G4730 Sleep apnea, unspecified: Secondary | ICD-10-CM | POA: Insufficient documentation

## 2014-04-15 DIAGNOSIS — D176 Benign lipomatous neoplasm of spermatic cord: Secondary | ICD-10-CM | POA: Insufficient documentation

## 2014-04-15 DIAGNOSIS — Z888 Allergy status to other drugs, medicaments and biological substances status: Secondary | ICD-10-CM | POA: Diagnosis not present

## 2014-04-15 HISTORY — PX: INSERTION OF MESH: SHX5868

## 2014-04-15 HISTORY — PX: INGUINAL HERNIA REPAIR: SHX194

## 2014-04-15 SURGERY — REPAIR, HERNIA, INGUINAL, ADULT
Anesthesia: General | Site: Groin | Laterality: Left

## 2014-04-15 MED ORDER — NEOSTIGMINE METHYLSULFATE 10 MG/10ML IV SOLN
INTRAVENOUS | Status: AC
  Filled 2014-04-15: qty 1

## 2014-04-15 MED ORDER — FENTANYL CITRATE 0.05 MG/ML IJ SOLN
INTRAMUSCULAR | Status: AC
  Filled 2014-04-15: qty 5

## 2014-04-15 MED ORDER — DEXAMETHASONE SODIUM PHOSPHATE 4 MG/ML IJ SOLN
INTRAMUSCULAR | Status: AC
  Filled 2014-04-15: qty 1

## 2014-04-15 MED ORDER — LACTATED RINGERS IV SOLN
INTRAVENOUS | Status: DC
  Administered 2014-04-15 (×3): via INTRAVENOUS

## 2014-04-15 MED ORDER — FENTANYL CITRATE 0.05 MG/ML IJ SOLN
INTRAMUSCULAR | Status: AC
  Filled 2014-04-15: qty 2

## 2014-04-15 MED ORDER — SODIUM CHLORIDE 0.9 % IR SOLN
Status: DC | PRN
  Administered 2014-04-15: 1000 mL

## 2014-04-15 MED ORDER — ONDANSETRON HCL 4 MG/2ML IJ SOLN
INTRAMUSCULAR | Status: AC
  Filled 2014-04-15: qty 2

## 2014-04-15 MED ORDER — FENTANYL CITRATE 0.05 MG/ML IJ SOLN
25.0000 ug | INTRAMUSCULAR | Status: AC
  Administered 2014-04-15: 25 ug via INTRAVENOUS

## 2014-04-15 MED ORDER — GLYCOPYRROLATE 0.2 MG/ML IJ SOLN
INTRAMUSCULAR | Status: AC
  Filled 2014-04-15: qty 3

## 2014-04-15 MED ORDER — DEXAMETHASONE SODIUM PHOSPHATE 10 MG/ML IJ SOLN
INTRAMUSCULAR | Status: DC | PRN
  Administered 2014-04-15: 4 mg via INTRAVENOUS

## 2014-04-15 MED ORDER — CEFAZOLIN SODIUM-DEXTROSE 2-3 GM-% IV SOLR
INTRAVENOUS | Status: AC
  Filled 2014-04-15: qty 50

## 2014-04-15 MED ORDER — ROCURONIUM BROMIDE 50 MG/5ML IV SOLN
INTRAVENOUS | Status: AC
  Filled 2014-04-15: qty 1

## 2014-04-15 MED ORDER — KETOROLAC TROMETHAMINE 30 MG/ML IJ SOLN
30.0000 mg | Freq: Once | INTRAMUSCULAR | Status: AC
  Administered 2014-04-15: 30 mg via INTRAVENOUS

## 2014-04-15 MED ORDER — LIDOCAINE HCL (CARDIAC) 10 MG/ML IV SOLN
INTRAVENOUS | Status: DC | PRN
  Administered 2014-04-15: 40 mg via INTRAVENOUS

## 2014-04-15 MED ORDER — BUPIVACAINE LIPOSOME 1.3 % IJ SUSP
INTRAMUSCULAR | Status: AC
  Filled 2014-04-15: qty 20

## 2014-04-15 MED ORDER — CEFAZOLIN SODIUM-DEXTROSE 2-3 GM-% IV SOLR: 2.0000 g | INTRAVENOUS | Status: DC

## 2014-04-15 MED ORDER — CEFAZOLIN SODIUM-DEXTROSE 2-3 GM-% IV SOLR
INTRAVENOUS | Status: DC | PRN
  Administered 2014-04-15: 2 g via INTRAVENOUS

## 2014-04-15 MED ORDER — CHLORHEXIDINE GLUCONATE 4 % EX LIQD
1.0000 | Freq: Once | CUTANEOUS | Status: DC
Start: 2014-04-15 — End: 2014-04-15

## 2014-04-15 MED ORDER — MIDAZOLAM HCL 2 MG/2ML IJ SOLN
1.0000 mg | INTRAMUSCULAR | Status: DC | PRN
  Administered 2014-04-15: 2 mg via INTRAVENOUS

## 2014-04-15 MED ORDER — KETOROLAC TROMETHAMINE 30 MG/ML IJ SOLN
INTRAMUSCULAR | Status: AC
  Filled 2014-04-15: qty 1

## 2014-04-15 MED ORDER — FENTANYL CITRATE 0.05 MG/ML IJ SOLN
INTRAMUSCULAR | Status: DC | PRN
  Administered 2014-04-15: 50 ug via INTRAVENOUS
  Administered 2014-04-15: 25 ug via INTRAVENOUS
  Administered 2014-04-15: 50 ug via INTRAVENOUS
  Administered 2014-04-15: 25 ug via INTRAVENOUS

## 2014-04-15 MED ORDER — ONDANSETRON HCL 4 MG/2ML IJ SOLN
4.0000 mg | Freq: Once | INTRAMUSCULAR | Status: AC
  Administered 2014-04-15: 4 mg via INTRAVENOUS

## 2014-04-15 MED ORDER — BUPIVACAINE LIPOSOME 1.3 % IJ SUSP
INTRAMUSCULAR | Status: DC | PRN
  Administered 2014-04-15: 20 mL

## 2014-04-15 MED ORDER — ONDANSETRON HCL 4 MG/2ML IJ SOLN
INTRAMUSCULAR | Status: DC | PRN
  Administered 2014-04-15: 4 mg via INTRAVENOUS

## 2014-04-15 MED ORDER — PROPOFOL 10 MG/ML IV BOLUS
INTRAVENOUS | Status: DC | PRN
  Administered 2014-04-15: 130 mg via INTRAVENOUS

## 2014-04-15 MED ORDER — MIDAZOLAM HCL 2 MG/2ML IJ SOLN
INTRAMUSCULAR | Status: AC
  Filled 2014-04-15: qty 2

## 2014-04-15 MED ORDER — HYDROCODONE-ACETAMINOPHEN 5-325 MG PO TABS
1.0000 | ORAL_TABLET | ORAL | Status: DC | PRN
Start: 2014-04-15 — End: 2014-08-04

## 2014-04-15 MED ORDER — PROPOFOL 10 MG/ML IV BOLUS
INTRAVENOUS | Status: AC
  Filled 2014-04-15: qty 20

## 2014-04-15 MED ORDER — ONDANSETRON HCL 4 MG/2ML IJ SOLN: 4.0000 mg | Freq: Once | INTRAMUSCULAR | Status: DC | PRN

## 2014-04-15 MED ORDER — FENTANYL CITRATE 0.05 MG/ML IJ SOLN
25.0000 ug | INTRAMUSCULAR | Status: DC | PRN
  Administered 2014-04-15 (×2): 50 ug via INTRAVENOUS

## 2014-04-15 MED ORDER — SUCCINYLCHOLINE CHLORIDE 20 MG/ML IJ SOLN
INTRAMUSCULAR | Status: AC
  Filled 2014-04-15: qty 1

## 2014-04-15 MED ORDER — EPHEDRINE SULFATE 50 MG/ML IJ SOLN
INTRAMUSCULAR | Status: AC
  Filled 2014-04-15: qty 1

## 2014-04-15 MED ORDER — EPHEDRINE SULFATE 50 MG/ML IJ SOLN
INTRAMUSCULAR | Status: DC | PRN
  Administered 2014-04-15: 5 mg via INTRAVENOUS
  Administered 2014-04-15: 10 mg via INTRAVENOUS

## 2014-04-15 SURGICAL SUPPLY — 45 items
BAG HAMPER (MISCELLANEOUS) ×3 IMPLANT
BLADE 10 SAFETY STRL DISP (BLADE) IMPLANT
CLOTH BEACON ORANGE TIMEOUT ST (SAFETY) ×3 IMPLANT
COVER LIGHT HANDLE STERIS (MISCELLANEOUS) ×6 IMPLANT
DECANTER SPIKE VIAL GLASS SM (MISCELLANEOUS) ×3 IMPLANT
DRAIN PENROSE 18X1/2 LTX STRL (DRAIN) ×3 IMPLANT
ELECT REM PT RETURN 9FT ADLT (ELECTROSURGICAL) ×3
ELECTRODE REM PT RTRN 9FT ADLT (ELECTROSURGICAL) ×1 IMPLANT
FORMALIN 10 PREFIL 120ML (MISCELLANEOUS) ×3 IMPLANT
GLOVE BIO SURGEON STRL SZ 6.5 (GLOVE) ×2 IMPLANT
GLOVE BIO SURGEONS STRL SZ 6.5 (GLOVE) ×1
GLOVE BIOGEL PI IND STRL 7.0 (GLOVE) ×2 IMPLANT
GLOVE BIOGEL PI IND STRL 7.5 (GLOVE) ×1 IMPLANT
GLOVE BIOGEL PI INDICATOR 7.0 (GLOVE) ×4
GLOVE BIOGEL PI INDICATOR 7.5 (GLOVE) ×2
GLOVE ECLIPSE 6.5 STRL STRAW (GLOVE) ×3 IMPLANT
GLOVE SURG SS PI 7.5 STRL IVOR (GLOVE) ×3 IMPLANT
GOWN STRL REUS W/ TWL XL LVL3 (GOWN DISPOSABLE) ×1 IMPLANT
GOWN STRL REUS W/TWL LRG LVL3 (GOWN DISPOSABLE) ×6 IMPLANT
GOWN STRL REUS W/TWL XL LVL3 (GOWN DISPOSABLE) ×2
INST SET MINOR GENERAL (KITS) ×3 IMPLANT
KIT ROOM TURNOVER APOR (KITS) ×3 IMPLANT
LIQUID BAND (GAUZE/BANDAGES/DRESSINGS) ×3 IMPLANT
MANIFOLD NEPTUNE II (INSTRUMENTS) ×3 IMPLANT
MESH HERNIA 1.6X1.9 PLUG LRG (Mesh General) ×1 IMPLANT
MESH HERNIA PLUG LRG (Mesh General) ×2 IMPLANT
MESH MARLEX PLUG MEDIUM (Mesh General) ×3 IMPLANT
NEEDLE HYPO 21X1.5 SAFETY (NEEDLE) ×3 IMPLANT
NS IRRIG 1000ML POUR BTL (IV SOLUTION) ×3 IMPLANT
PACK MINOR (CUSTOM PROCEDURE TRAY) ×3 IMPLANT
PAD ARMBOARD 7.5X6 YLW CONV (MISCELLANEOUS) ×3 IMPLANT
PENCIL HANDSWITCHING (ELECTRODE) ×3 IMPLANT
SCRUB PCMX 4 OZ (MISCELLANEOUS) ×3 IMPLANT
SET BASIN LINEN APH (SET/KITS/TRAYS/PACK) ×3 IMPLANT
SUT NOVA NAB GS-22 2 2-0 T-19 (SUTURE) ×9 IMPLANT
SUT NOVAFIL NAB HGS22 2-0 30IN (SUTURE) IMPLANT
SUT SILK 3 0 (SUTURE)
SUT SILK 3-0 18XBRD TIE 12 (SUTURE) IMPLANT
SUT VIC AB 2-0 CT1 27 (SUTURE) ×2
SUT VIC AB 2-0 CT1 TAPERPNT 27 (SUTURE) ×1 IMPLANT
SUT VIC AB 3-0 SH 27 (SUTURE) ×2
SUT VIC AB 3-0 SH 27X BRD (SUTURE) ×1 IMPLANT
SUT VIC AB 4-0 PS2 27 (SUTURE) ×3 IMPLANT
SUT VICRYL AB 3 0 TIES (SUTURE) ×3 IMPLANT
SYR 20CC LL (SYRINGE) ×3 IMPLANT

## 2014-04-15 NOTE — Anesthesia Postprocedure Evaluation (Signed)
  Anesthesia Post-op Note  Patient: [name]  Procedure(s) Performed: Procedure(s): LEFT INGUINAL HERNIORRHAPHY WITH MESH (Left) INSERTION OF MESH (Left)  Patient Location: PACU  Anesthesia Type:General  Level of Consciousness: awake, alert , oriented, patient cooperative and responds to stimulation  Airway and Oxygen Therapy: Patient Spontanous Breathing and Patient connected to face mask oxygen  Post-op Pain: none  Post-op Assessment: Post-op Vital signs reviewed, Patient's Cardiovascular Status Stable, Respiratory Function Stable, Patent Airway, No signs of Nausea or vomiting and Pain level controlled  Post-op Vital Signs: Reviewed and stable  Last Vitals:  Filed Vitals:   04/15/14 0849  BP: 137/76  Pulse:   Temp:   Resp: 16    Complications: No apparent anesthesia complications

## 2014-04-15 NOTE — Anesthesia Procedure Notes (Signed)
Procedure Name: LMA Insertion Date/Time: 04/15/2014 9:00 AM Performed by: Gershon Mussel, Burgandy Hackworth Pre-anesthesia Checklist: Patient identified, Patient being monitored, Timeout performed, Emergency Drugs available and Suction available Patient Re-evaluated:Patient Re-evaluated prior to inductionOxygen Delivery Method: Circle System Utilized Preoxygenation: Pre-oxygenation with 100% oxygen Intubation Type: IV induction Ventilation: Mask ventilation without difficulty LMA Size: 4.0 Tube size: 7.0 mm Number of attempts: 1 Airway Equipment and Method: Stylet Placement Confirmation: positive ETCO2 Secured at: 21 cm Tube secured with: Tape Dental Injury: Teeth and Oropharynx as per pre-operative assessment

## 2014-04-15 NOTE — Transfer of Care (Signed)
Immediate Anesthesia Transfer of Care Note  Patient: [name]  Procedure(s) Performed: Procedure(s): LEFT INGUINAL HERNIORRHAPHY WITH MESH (Left) INSERTION OF MESH (Left)  Patient Location: PACU  Anesthesia Type:General  Level of Consciousness: awake, alert , oriented, patient cooperative and responds to stimulation  Airway & Oxygen Therapy: Patient Spontanous Breathing and Patient connected to face mask oxygen  Post-op Assessment: Report given to RN, Post -op Vital signs reviewed and stable, Patient moving all extremities and Patient moving all extremities X 4  Post vital signs: Reviewed and stable  Last Vitals:  Filed Vitals:   04/15/14 0849  BP: 137/76  Pulse:   Temp:   Resp: 16    Complications: No apparent anesthesia complications

## 2014-04-15 NOTE — Anesthesia Preprocedure Evaluation (Signed)
Anesthesia Evaluation  Patient identified by MRN, date of birth, ID band Patient awake    Reviewed: Allergy & Precautions, H&P , NPO status , Patient's Chart, lab work & pertinent test results  Airway Mallampati: II  TM Distance: >3 FB Neck ROM: Full    Dental  (+) Teeth Intact, Poor Dentition, Missing, Dental Advisory Given   Pulmonary shortness of breath, sleep apnea , COPDCurrent Smoker,  breath sounds clear to auscultation- rhonchi        Cardiovascular hypertension, Pt. on medications Rhythm:Regular Rate:Normal     Neuro/Psych CVA, Residual Symptoms    GI/Hepatic GERD-  Controlled and Medicated,  Endo/Other    Renal/GU      Musculoskeletal   Abdominal   Peds  Hematology   Anesthesia Other Findings   Reproductive/Obstetrics                             Anesthesia Physical Anesthesia Plan  ASA: III  Anesthesia Plan: General   Post-op Pain Management:    Induction: Intravenous  Airway Management Planned: LMA  Additional Equipment:   Intra-op Plan:   Post-operative Plan: Extubation in OR  Informed Consent: I have reviewed the patients History and Physical, chart, labs and discussed the procedure including the risks, benefits and alternatives for the proposed anesthesia with the patient or authorized representative who has indicated his/her understanding and acceptance.     Plan Discussed with:   Anesthesia Plan Comments:         Anesthesia Quick Evaluation

## 2014-04-15 NOTE — Op Note (Signed)
Patient:  [name]  DOB:  Sep 18, 1934  MRN:  [id number]   Preop Diagnosis:  Left inguinal hernia  Postop Diagnosis:  Same  Procedure:  Left inguinal herniorrhaphy with mesh  Surgeon:  Aviva Signs, M.D.  Anes:  Gen.  Indications:  Patient is a 80 year old black male who presents with a symptomatic left inguinal hernia. The risks and benefits of the procedure including bleeding, infection, mesh use, and recurrence of the hernia were fully explained to the patient, who gave informed consent.  Procedure note:  The patient was placed in the supine position. After general anesthesia was administered, the left groin region was prepped and draped using the usual sterile technique with Betadine. Surgical site confirmation was performed.  A left groin incision was made down to the external oblique aponeuroses. The aponeuroses was incised to the external ring. A Penrose drain was placed around the spermatic cord. The vase deferens was noted within the spermatic cord. The ilioinguinal nerve was identified retracted superiorly from the operative field. The patient had both an indirect and direct hernia. Both were reduced at the peritoneal reflection and inverted. A large Bard PerFix plug was then placed in the indirect ring and a medium size plug was placed in the direct ring. An onlay patch was then placed off the floor of the inguinal canal and secured superiorly to the conjoined tendon and inferiorly to the shelving edge of Poupart's ligament using 2-0 Novafil interrupted sutures. The internal ring was re-created using a 2-0 Novafil interrupted suture. A lipoma of the cord was excised and sent to pathology for further examination. The external oblique aponeuroses was reapproximated using a 2-0 Vicryl running suture. The subcutaneous layer was reapproximated using 3-0 Vicryl interrupted sutures. The skin was closed using a 4-0 Vicryl subcuticular suture. Exparel was instilled the surrounding wound. Liquid  barium was then applied.  All tape and needle counts were correct at the end of the procedure. Patient was awakened and transferred to PACU in stable condition.    Complications:  None  EBL:  Minimal  Specimen:  Lipoma of spermatic cord

## 2014-04-15 NOTE — Discharge Instructions (Signed)
Inguinal Hernia, Adult  °Care After °Refer to this sheet in the next few weeks. These discharge instructions provide you with general information on caring for yourself after you leave the hospital. Your caregiver may also give you specific instructions. Your treatment has been planned according to the most current medical practices available, but unavoidable complications sometimes occur. If you have any problems or questions after discharge, please call your caregiver. °HOME CARE INSTRUCTIONS °· Put ice on the operative site. °¨ Put ice in a plastic bag. °¨ Place a towel between your skin and the bag. °¨ Leave the ice on for 15-20 minutes at a time, 03-04 times a day while awake. °· Change bandages (dressings) as directed. °· Keep the wound dry and clean. The wound may be washed gently with soap and water. Gently blot or dab the wound dry. It is okay to take showers 24 to 48 hours after surgery. Do not take baths, use swimming pools, or use hot tubs for 10 days, or as directed by your caregiver. °· Only take over-the-counter or prescription medicines for pain, discomfort, or fever as directed by your caregiver. °· Continue your normal diet as directed. °· Do not lift anything more than 10 pounds or play contact sports for 3 weeks, or as directed. °SEEK MEDICAL CARE IF: °· There is redness, swelling, or increasing pain in the wound. °· There is fluid (pus) coming from the wound. °· There is drainage from a wound lasting longer than 1 day. °· You have an oral temperature above 102° F (38.9° C). °· You notice a bad smell coming from the wound or dressing. °· The wound breaks open after the stitches (sutures) have been removed. °· You notice increasing pain in the shoulders (shoulder strap areas). °· You develop dizzy episodes or fainting while standing. °· You feel sick to your stomach (nauseous) or throw up (vomit). °SEEK IMMEDIATE MEDICAL CARE IF: °· You develop a rash. °· You have difficulty breathing. °· You  develop a reaction or have side effects to medicines you were given. °MAKE SURE YOU:  °· Understand these instructions. °· Will watch your condition. °· Will get help right away if you are not doing well or get worse. °Document Released: 01/19/2006 Document Revised: 03/13/2011 Document Reviewed: 11/18/2008 °[employer]® Patient Information ©2015 [employer]. This information is not intended to replace advice given to you by your health care provider. Make sure you discuss any questions you have with your health care provider. ° °

## 2014-04-15 NOTE — Interval H&P Note (Signed)
History and Physical Interval Note:  04/15/2014 8:42 AM  [name]  has presented today for surgery, with the diagnosis of left inguinal hernia  The various methods of treatment have been discussed with the patient and family. After consideration of risks, benefits and other options for treatment, the patient has consented to  Procedure(s): LEFT INGUINAL HERNIORRHAPHY WITH MESH (Left) as a surgical intervention .  The patient's history has been reviewed, patient examined, no change in status, stable for surgery.  I have reviewed the patient's chart and labs.  Questions were answered to the patient's satisfaction.     Aviva Signs A

## 2014-04-16 ENCOUNTER — Encounter (HOSPITAL_COMMUNITY): Payer: Self-pay | Admitting: General Surgery

## 2014-04-16 NOTE — Addendum Note (Signed)
Addendum  created 04/16/14 5974 by Mickel Baas, CRNA   Modules edited: Charges VN

## 2014-04-23 ENCOUNTER — Other Ambulatory Visit: Payer: Self-pay | Admitting: Neurology

## 2014-04-26 ENCOUNTER — Emergency Department (HOSPITAL_COMMUNITY): Payer: Medicare Other

## 2014-04-26 ENCOUNTER — Encounter (HOSPITAL_COMMUNITY): Payer: Self-pay | Admitting: Emergency Medicine

## 2014-04-26 ENCOUNTER — Emergency Department (HOSPITAL_COMMUNITY)
Admission: EM | Admit: 2014-04-26 | Discharge: 2014-04-26 | Disposition: A | Discharge status: Home / Self Care | Payer: Medicare Other | Attending: Emergency Medicine | Admitting: Emergency Medicine

## 2014-04-26 ENCOUNTER — Other Ambulatory Visit: Payer: Self-pay

## 2014-04-26 DIAGNOSIS — H5442 Blindness, left eye, normal vision right eye: Secondary | ICD-10-CM | POA: Insufficient documentation

## 2014-04-26 DIAGNOSIS — K5900 Constipation, unspecified: Secondary | ICD-10-CM | POA: Insufficient documentation

## 2014-04-26 DIAGNOSIS — K219 Gastro-esophageal reflux disease without esophagitis: Secondary | ICD-10-CM | POA: Insufficient documentation

## 2014-04-26 DIAGNOSIS — Z79899 Other long term (current) drug therapy: Secondary | ICD-10-CM | POA: Insufficient documentation

## 2014-04-26 DIAGNOSIS — Z8673 Personal history of transient ischemic attack (TIA), and cerebral infarction without residual deficits: Secondary | ICD-10-CM | POA: Insufficient documentation

## 2014-04-26 DIAGNOSIS — M1990 Unspecified osteoarthritis, unspecified site: Secondary | ICD-10-CM | POA: Diagnosis not present

## 2014-04-26 DIAGNOSIS — R5383 Other fatigue: Secondary | ICD-10-CM | POA: Diagnosis present

## 2014-04-26 DIAGNOSIS — Y95 Nosocomial condition: Secondary | ICD-10-CM

## 2014-04-26 DIAGNOSIS — I10 Essential (primary) hypertension: Secondary | ICD-10-CM | POA: Diagnosis not present

## 2014-04-26 DIAGNOSIS — Z8669 Personal history of other diseases of the nervous system and sense organs: Secondary | ICD-10-CM | POA: Diagnosis not present

## 2014-04-26 DIAGNOSIS — Z7982 Long term (current) use of aspirin: Secondary | ICD-10-CM | POA: Diagnosis not present

## 2014-04-26 DIAGNOSIS — J189 Pneumonia, unspecified organism: Secondary | ICD-10-CM

## 2014-04-26 DIAGNOSIS — J159 Unspecified bacterial pneumonia: Secondary | ICD-10-CM | POA: Insufficient documentation

## 2014-04-26 DIAGNOSIS — Z720 Tobacco use: Secondary | ICD-10-CM | POA: Diagnosis not present

## 2014-04-26 LAB — CBC WITH DIFFERENTIAL/PLATELET
Basophils Absolute: 0.1 10*3/uL (ref 0.0–0.1)
Basophils Relative: 1 % (ref 0–1)
EOS ABS: 0.2 10*3/uL (ref 0.0–0.7)
Eosinophils Relative: 2 % (ref 0–5)
HEMATOCRIT: 36.9 % — AB (ref 39.0–52.0)
Hemoglobin: 12.4 g/dL — ABNORMAL LOW (ref 13.0–17.0)
Lymphocytes Relative: 23 % (ref 12–46)
Lymphs Abs: 2.1 10*3/uL (ref 0.7–4.0)
MCH: 30.9 pg (ref 26.0–34.0)
MCHC: 33.6 g/dL (ref 30.0–36.0)
MCV: 92 fL (ref 78.0–100.0)
MONO ABS: 0.7 10*3/uL (ref 0.1–1.0)
Monocytes Relative: 8 % (ref 3–12)
Neutro Abs: 5.9 10*3/uL (ref 1.7–7.7)
Neutrophils Relative %: 66 % (ref 43–77)
PLATELETS: 190 10*3/uL (ref 150–400)
RBC: 4.01 MIL/uL — AB (ref 4.22–5.81)
RDW: 13 % (ref 11.5–15.5)
WBC: 9 10*3/uL (ref 4.0–10.5)

## 2014-04-26 LAB — COMPREHENSIVE METABOLIC PANEL
ALK PHOS: 56 U/L (ref 39–117)
ALT: 17 U/L (ref 0–53)
AST: 27 U/L (ref 0–37)
Albumin: 3.1 g/dL — ABNORMAL LOW (ref 3.5–5.2)
Anion gap: 5 (ref 5–15)
BUN: 13 mg/dL (ref 6–23)
CHLORIDE: 106 mmol/L (ref 96–112)
CO2: 25 mmol/L (ref 19–32)
CREATININE: 1.11 mg/dL (ref 0.50–1.35)
Calcium: 8.7 mg/dL (ref 8.4–10.5)
GFR calc Af Amer: 72 mL/min — ABNORMAL LOW (ref >90)
GFR calc non Af Amer: 62 mL/min — ABNORMAL LOW (ref >90)
Glucose, Bld: 135 mg/dL — ABNORMAL HIGH (ref 70–99)
POTASSIUM: 3.7 mmol/L (ref 3.5–5.1)
SODIUM: 136 mmol/L (ref 135–145)
Total Bilirubin: 0.8 mg/dL (ref 0.3–1.2)
Total Protein: 7.3 g/dL (ref 6.0–8.3)

## 2014-04-26 LAB — URINALYSIS, ROUTINE W REFLEX MICROSCOPIC
Bilirubin Urine: NEGATIVE
Glucose, UA: NEGATIVE mg/dL
KETONES UR: NEGATIVE mg/dL
LEUKOCYTES UA: NEGATIVE
Nitrite: NEGATIVE
Protein, ur: NEGATIVE mg/dL
SPECIFIC GRAVITY, URINE: 1.025 (ref 1.005–1.030)
UROBILINOGEN UA: 0.2 mg/dL (ref 0.0–1.0)
pH: 5.5 (ref 5.0–8.0)

## 2014-04-26 LAB — URINE MICROSCOPIC-ADD ON

## 2014-04-26 LAB — TROPONIN I

## 2014-04-26 MED ORDER — LEVOFLOXACIN 750 MG PO TABS: 750.0000 mg | ORAL_TABLET | Freq: Every day | ORAL | Status: DC

## 2014-04-26 MED ORDER — LEVOFLOXACIN 750 MG PO TABS
750.0000 mg | ORAL_TABLET | Freq: Once | ORAL | Status: AC
  Administered 2014-04-26: 750 mg via ORAL
  Filled 2014-04-26: qty 1

## 2014-04-26 NOTE — Discharge Instructions (Signed)
Pneumonia, Adult  Pneumonia is an infection of the lungs. It may be caused by a germ (virus or bacteria). Some types of pneumonia can spread easily from person to person. This can happen when you cough or sneeze.  HOME CARE   Only take medicine as told by your doctor.   Take your medicine (antibiotics) as told. Finish it even if you start to feel better.   Do not smoke.   You may use a vaporizer or humidifier in your room. This can help loosen thick spit (mucus).   Sleep so you are almost sitting up (semi-upright). This helps reduce coughing.   Rest.  A shot (vaccine) can help prevent pneumonia. Shots are often advised for:   People over 65 years old.   Patients on chemotherapy.   People with long-term (chronic) lung problems.   People with immune system problems.  GET HELP RIGHT AWAY IF:    You are getting worse.   You cannot control your cough, and you are losing sleep.   You cough up blood.   Your pain gets worse, even with medicine.   You have a fever.   Any of your problems are getting worse, not better.   You have shortness of breath or chest pain.  MAKE SURE YOU:    Understand these instructions.   Will watch your condition.   Will get help right away if you are not doing well or get worse.  Document Released: 06/07/2007 Document Revised: 03/13/2011 Document Reviewed: 03/11/2010  ExitCare Patient Information 2015 [employer]. This information is not intended to replace advice given to you by your health care provider. Make sure you discuss any questions you have with your health care provider.

## 2014-04-26 NOTE — ED Provider Notes (Signed)
CSN: [id number]     Arrival date & time 04/26/14  8101 History   First MD Initiated Contact with Patient 04/26/14 [phone]     Chief Complaint  Patient presents with  . Fatigue     (Consider location/radiation/quality/duration/timing/severity/associated sxs/prior Treatment) The history is provided by the patient.   [name] is a 80 y.o. 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 He had a left inguinal repair 04/15/14, follow up with Dr. Arnoldo Morale 2 days ago for this and is without complication at this time.  He has had a cough occasionally productive of white sputum. He denies sob at rest, but has mild sob with exertion.  He denies headache, uri sx, neck pain or stiffness, abdominal pain, nausea or vomiting, no diarrhea or dysuria. He also denies chest or back pain.  He does have occasional angina which is treated with prn ntg, last dose taken 3-4 days ago with prompt resolution of symptoms.  He reports anorexia for the past 2 days and insomnia last night. He has had no medicines this am that would suppress fever this morning. He denies any recent contact with ill persons and obtained a flu shot last fall.    Past Medical History  Diagnosis Date  . Hypertension   . Stroke     Left side weakness.  . Shortness of breath     with activity  . Sleep apnea     does not use CPAP.  Tested maybe 5- 10 years ago  . GERD (gastroesophageal reflux disease)   . Constipation   . Glaucoma   . Abnormality of gait 08/01/2013  . Blindness of left eye     Posttraumatic  . Arthritis    Past Surgical History  Procedure Laterality Date  . Hernia repair Right     inguinal  . Cervical fusion    . Anterior cervical decomp/discectomy fusion N/A 07/01/2012    Procedure: ANTERIOR  CERVICAL DECOMPRESSION/DISCECTOMY FUSION CERVICAL FIVE-SIX [name] REMOVAL CERVICAL SIX-SEVEN;  Surgeon: [name], MD;  Location: [facility];  Service: Neurosurgery;  Laterality: N/A;  . Esophagogastroduodenoscopy  04/14/2002    CHE:NIDPOEUM'P' ring, status post dilation as described above/Hiatal hernia, focal antral erosions of uncertain clinical significance  . Colonoscopy   08/04/2002      RMR: Normal rectum/Pancolonic diverticula/Colonic polyps as described above, biopsied and/or snared  . Esophagogastroduodenoscopy  06/09/2009    RMR: normal esophagus s/p dilator/small hiatal hernia otherwise normal  . Colonoscopy  60/08/2009    RMR: normal rectum/left and right sided diverticula/multiple colonic polyps. tubular adenomas. surveillance due 2014  . Colonoscopy with esophagogastroduodenoscopy (egd) N/A 12/18/2012    Dr. [name]:  Colonic diverticulosis. Multiple colonic polyps-hyperplastic polyps and tubular adenoma. Friable anal canal hemorrhoids. EGD with chronic atrophic gastritis  . Inguinal hernia repair Left 04/15/2014    Procedure: LEFT INGUINAL HERNIORRHAPHY WITH MESH;  Surgeon: [name], MD;  Location: [facility];  Service: General;  Laterality: Left;  . Insertion of mesh Left 04/15/2014    Procedure: INSERTION OF MESH;  Surgeon: [name], MD;  Location: [facility];  Service: General;  Laterality: Left;   Family History  Problem Relation Age of Onset  . Colon cancer Neg Hx   . Cancer Mother    History  Substance Use  Topics  . Smoking status: Light Tobacco Smoker -- 0.25 packs/day for 60 years    Types: Cigarettes  . Smokeless tobacco: Never Used  . Alcohol Use: No    Review of Systems  Constitutional: Positive for fever, chills and appetite change.  HENT: Negative.  Negative for congestion and sore throat.   Eyes: Negative.   Respiratory: Positive for cough and shortness of breath. Negative for chest tightness and wheezing.   Cardiovascular: Negative for chest  pain, palpitations and leg swelling.  Gastrointestinal: Negative for nausea, vomiting, abdominal pain, diarrhea and constipation.  Genitourinary: Negative.  Negative for dysuria.  Musculoskeletal: Negative for joint swelling, arthralgias, neck pain and neck stiffness.  Skin: Negative.  Negative for rash and wound.  Neurological: Negative for dizziness, weakness, light-headedness, numbness and headaches.  Psychiatric/Behavioral: Negative.       Allergies  Aspirin  Home Medications   Prior to Admission medications   Medication Sig Start Date End Date Taking? Authorizing Provider  aspirin EC 81 MG tablet Take 81 mg by mouth daily.   Yes Historical Provider, MD  baclofen (LIORESAL) 10 MG tablet TAKE (1/2) TABLET BY MOUTH THREE TIMES A DAY. 04/23/14  Yes Kathrynn Ducking, MD  bisacodyl (DULCOLAX) 5 MG EC tablet Take 5 mg by mouth daily.   Yes Historical Provider, MD  cholecalciferol (VITAMIN D) 1000 UNITS tablet Take 1,000 Units by mouth daily.   Yes Historical Provider, MD  diphenhydrAMINE (BENADRYL) 25 MG tablet Take 25 mg by mouth daily.   Yes Historical Provider, MD  docusate sodium (COLACE) 100 MG capsule Take 100 mg by mouth daily.   Yes Historical Provider, MD  fluticasone (FLONASE) 50 MCG/ACT nasal spray Place 2 sprays into the nose daily as needed for rhinitis.   Yes Historical Provider, MD  hydrochlorothiazide (HYDRODIURIL) 25 MG tablet Take 25 mg by mouth daily.   Yes Historical Provider, MD  HYDROcodone-acetaminophen (NORCO/VICODIN) 5-325 MG per tablet Take 1 tablet by mouth every 4 (four) hours as needed for moderate pain. 04/15/14 04/15/15 Yes [name], MD  lisinopril (PRINIVIL,ZESTRIL) 10 MG tablet Take 10 mg by mouth daily.   Yes Historical Provider, MD  LORazepam (ATIVAN) 0.5 MG tablet Take 1 mg by mouth at bedtime as needed for sleep.   Yes Historical Provider, MD  lubiprostone (AMITIZA) 24 MCG capsule Take 1 capsule (24 mcg total) by mouth 2 (two) times daily with a  meal. 12/03/12  Yes Orvil Feil, NP  Melatonin 3 MG CAPS Take 3 capsules by mouth at bedtime.   Yes Historical Provider, MD  metoCLOPramide (REGLAN) 10 MG tablet Take 10 mg by mouth at bedtime.   Yes Historical Provider, MD  Multiple Vitamin (MULTIVITAMIN WITH MINERALS) TABS Take 1 tablet by mouth daily.   Yes Historical Provider, MD  nitroGLYCERIN (NITROSTAT) 0.4 MG SL tablet Place 0.4 mg under the tongue every 5 (five) minutes as needed for chest pain.   Yes Historical Provider, MD  omeprazole (PRILOSEC) 20 MG capsule Take 20 mg by mouth daily.   Yes Historical Provider, MD  potassium chloride SA (K-DUR,KLOR-CON) 20 MEQ tablet Take 20 mEq by mouth 2 (two) times daily.   Yes Historical Provider, MD  solifenacin (VESICARE) 5 MG tablet Take 5 mg by mouth daily.   Yes Historical Provider, MD  vitamin C (ASCORBIC ACID) 500 MG tablet Take 500 mg by mouth daily.   Yes Historical Provider, MD  levofloxacin (LEVAQUIN) 750 MG tablet Take 1 tablet (750 mg total) by mouth  daily. 04/26/14   Evalee Jefferson, PA-C   BP 118/71 mmHg  Pulse 75  Temp(Src) 98.1 F (36.7 C) (Oral)  Resp 21  Ht 5\' 9"  (1.753 m)  Wt 178 lb (80.74 kg)  BMI 26.27 kg/m2  SpO2 100% Physical Exam  Constitutional: He is oriented to person, place, and time. He appears well-developed and well-nourished.  HENT:  Head: Normocephalic and atraumatic.  Oropharynx clear, dry.  Eyes: Conjunctivae are normal.  Neck: Normal range of motion. Neck supple.  Cardiovascular: Normal rate, regular rhythm, normal heart sounds and intact distal pulses.   Pulmonary/Chest: Effort normal and breath sounds normal. He has no wheezes. He has no rales. He exhibits no tenderness.  Abdominal: Soft. Bowel sounds are normal. He exhibits no distension. There is no tenderness. There is no guarding.  Well healing surgical incision left groin, no edema or erythema.  Musculoskeletal: He exhibits no edema or tenderness.  Lymphadenopathy:    He has no cervical adenopathy.   Neurological: He is alert and oriented to person, place, and time.  Skin: Skin is warm and dry. No rash noted.  Psychiatric: He has a normal mood and affect.  Nursing note and vitals reviewed.   ED Course  Procedures (including critical care time) Labs Review Labs Reviewed  COMPREHENSIVE METABOLIC PANEL - Abnormal; Notable for the following:    Glucose, Bld 135 (*)    Albumin 3.1 (*)    GFR calc non Af Amer 62 (*)    GFR calc Af Amer 72 (*)    All other components within normal limits  CBC WITH DIFFERENTIAL/PLATELET - Abnormal; Notable for the following:    RBC 4.01 (*)    Hemoglobin 12.4 (*)    HCT 36.9 (*)    All other components within normal limits  URINALYSIS, ROUTINE W REFLEX MICROSCOPIC - Abnormal; Notable for the following:    Hgb urine dipstick TRACE (*)    All other components within normal limits  TROPONIN I  URINE MICROSCOPIC-ADD ON    Imaging Review Dg Chest 2 View  04/26/2014   CLINICAL DATA:  Fatigue. Fever and cough. Generalized weakness for 3 days.  EXAM: CHEST  2 VIEW  COMPARISON:  02/06/2014 and 12/30/2013  FINDINGS: Again noted are subtle densities in the right upper lung that appear to be chronic. There is concern for new subtle densities at the left lung base. Heart size is stable. Again noted are large calcifications along the left side of the mediastinum.  IMPRESSION: Subtle densities at the left lung base could represent atelectasis or infection.  Chronic densities in the right upper lung.  Evidence for old granulomatous disease.   Electronically Signed   By: Markus Daft M.D.   On: 04/26/2014 10:42     EKG Interpretation None      ED ECG REPORT   Date: 04/26/2014  Rate: 86  Rhythm: normal sinus rhythm  QRS Axis: normal  Intervals: normal  ST/T Wave abnormalities: normal  Conduction Disutrbances:first-degree A-V block   Narrative Interpretation: unchanged from 04/10/14  Old EKG Reviewed: unchanged    Results for orders placed or performed  during the hospital encounter of 04/26/14  Troponin I  Result Value Ref Range   Troponin I <0.03 <0.031 ng/mL  Comprehensive metabolic panel  Result Value Ref Range   Sodium 136 135 - 145 mmol/L   Potassium 3.7 3.5 - 5.1 mmol/L   Chloride 106 96 - 112 mmol/L   CO2 25 19 - 32 mmol/L   Glucose,  Bld 135 (H) 70 - 99 mg/dL   BUN 13 6 - 23 mg/dL   Creatinine, Ser 1.11 0.50 - 1.35 mg/dL   Calcium 8.7 8.4 - 10.5 mg/dL   Total Protein 7.3 6.0 - 8.3 g/dL   Albumin 3.1 (L) 3.5 - 5.2 g/dL   AST 27 0 - 37 U/L   ALT 17 0 - 53 U/L   Alkaline Phosphatase 56 39 - 117 U/L   Total Bilirubin 0.8 0.3 - 1.2 mg/dL   GFR calc non Af Amer 62 (L) >90 mL/min   GFR calc Af Amer 72 (L) >90 mL/min   Anion gap 5 5 - 15  CBC with Differential  Result Value Ref Range   WBC 9.0 4.0 - 10.5 K/uL   RBC 4.01 (L) 4.22 - 5.81 MIL/uL   Hemoglobin 12.4 (L) 13.0 - 17.0 g/dL   HCT 36.9 (L) 39.0 - 52.0 %   MCV 92.0 78.0 - 100.0 fL   MCH 30.9 26.0 - 34.0 pg   MCHC 33.6 30.0 - 36.0 g/dL   RDW 13.0 11.5 - 15.5 %   Platelets 190 150 - 400 K/uL   Neutrophils Relative % 66 43 - 77 %   Lymphocytes Relative 23 12 - 46 %   Monocytes Relative 8 3 - 12 %   Eosinophils Relative 2 0 - 5 %   Basophils Relative 1 0 - 1 %   Neutro Abs 5.9 1.7 - 7.7 K/uL   Lymphs Abs 2.1 0.7 - 4.0 K/uL   Monocytes Absolute 0.7 0.1 - 1.0 K/uL   Eosinophils Absolute 0.2 0.0 - 0.7 K/uL   Basophils Absolute 0.1 0.0 - 0.1 K/uL   WBC Morphology ATYPICAL LYMPHOCYTES   Urinalysis, Routine w reflex microscopic  Result Value Ref Range   Color, Urine YELLOW YELLOW   APPearance CLEAR CLEAR   Specific Gravity, Urine 1.025 1.005 - 1.030   pH 5.5 5.0 - 8.0   Glucose, UA NEGATIVE NEGATIVE mg/dL   Hgb urine dipstick TRACE (A) NEGATIVE   Bilirubin Urine NEGATIVE NEGATIVE   Ketones, ur NEGATIVE NEGATIVE mg/dL   Protein, ur NEGATIVE NEGATIVE mg/dL   Urobilinogen, UA 0.2 0.0 - 1.0 mg/dL   Nitrite NEGATIVE NEGATIVE   Leukocytes, UA NEGATIVE NEGATIVE  Urine  microscopic-add on  Result Value Ref Range   Squamous Epithelial / LPF RARE RARE   WBC, UA 0-2 <3 WBC/hpf   RBC / HPF 0-2 <3 RBC/hpf    MDM   Final diagnoses:  HAP (hospital-acquired pneumonia)    Patients labs and/or radiological studies were reviewed and considered during the medical decision making and disposition process.  Results were also discussed with patient. Pt was seen by Dr. Sabra Heck during this visit.  Pt with subtle but probable left lower pneumonia, hospital acquired given hospitalization last week for hernia repair.  He was placed on levaquin 750 mg qd x 7 days, first dose given here.  Advised recheck by pcp and/or return here for any worsened sx including worse fever, sob, weakness.   The patient appears reasonably screened and/or stabilized for discharge and I doubt any other medical condition or other [facility] requiring further screening, evaluation, or treatment in the ED at this time prior to discharge.     Evalee Jefferson, PA-C 04/26/14 1715  Noemi Chapel, MD 04/26/14 [phone]

## 2014-04-26 NOTE — ED Provider Notes (Addendum)
HPI: Patient complaining of subjective fever and cough for 2 days. He reports history surgery to left abdomen.  PE: Rales of the left base.  Pt will be treated with Levaquin for CAP - toehrwise non toxic appearing, pt is agreeable to plan.   EKG Interpretation  Date/Time:  Sunday April 26 2014 09:09:19 EDT Ventricular Rate:  86 PR Interval:  194 QRS Duration: 82 QT Interval:  347 QTC Calculation: 415 R Axis:   69 Text Interpretation:  Sinus rhythm Anteroseptal infarct, age indeterminate Baseline wander in lead(s) I II aVR Abnormal ekg since last tracing no significant change Confirmed by Sabra Heck  MD, Jomarion Mish ([id number]) on 04/26/2014 8:54:06 PM        Medical screening examination/treatment/procedure(s) were conducted as a shared visit with non-physician practitioner(s) and myself.  I personally evaluated the patient during the encounter.  Clinical Impression:   Final diagnoses:  HAP (hospital-acquired pneumonia)         Noemi Chapel, MD 04/26/14 2052  Noemi Chapel, MD 04/26/14 2055

## 2014-04-26 NOTE — ED Notes (Addendum)
Pt reports fever,cough, and generalized weakness x3 days. Pt denies any gi/gu symptoms.

## 2014-05-26 ENCOUNTER — Other Ambulatory Visit (HOSPITAL_COMMUNITY): Payer: Self-pay | Admitting: Pulmonary Disease

## 2014-05-26 DIAGNOSIS — R4702 Dysphasia: Secondary | ICD-10-CM

## 2014-06-03 ENCOUNTER — Other Ambulatory Visit (HOSPITAL_COMMUNITY): Payer: Medicare Other

## 2014-06-03 ENCOUNTER — Encounter (HOSPITAL_COMMUNITY): Payer: Medicare Other | Admitting: Speech Pathology

## 2014-06-04 ENCOUNTER — Ambulatory Visit (HOSPITAL_COMMUNITY): Payer: Medicare Other | Attending: Family Medicine | Admitting: Speech Pathology

## 2014-06-04 ENCOUNTER — Ambulatory Visit (HOSPITAL_COMMUNITY)
Admission: RE | Admit: 2014-06-04 | Discharge: 2014-06-04 | Disposition: A | Discharge status: Home / Self Care | Payer: Medicare Other | Source: Ambulatory Visit | Attending: Pulmonary Disease | Admitting: Pulmonary Disease

## 2014-06-04 DIAGNOSIS — R1314 Dysphagia, pharyngoesophageal phase: Secondary | ICD-10-CM | POA: Insufficient documentation

## 2014-06-04 DIAGNOSIS — R4702 Dysphasia: Secondary | ICD-10-CM | POA: Diagnosis not present

## 2014-06-04 NOTE — Therapy (Signed)
[place], [place], [address] Phone: [phone]   Fax:  [phone]  Modified Barium Swallow  Patient Details  Name: [name] MRN: [id number] Date of Birth: [birth year] Referring Provider:  Lemmie Evens, MD  Encounter Date: 06/04/2014      End of Session - 06/04/14 1316    Visit Number 1   Number of Visits 1   Authorization Type [employer] Medicare   SLP Start Time 1310   SLP Stop Time  3976   SLP Time Calculation (min) 28 min   Activity Tolerance Patient tolerated treatment well      Past Medical History  Diagnosis Date  . Hypertension   . Stroke     Left side weakness.  . Shortness of breath     with activity  . Sleep apnea     does not use CPAP.  Tested maybe 5- 10 years ago  . GERD (gastroesophageal reflux disease)   . Constipation   . Glaucoma   . Abnormality of gait 08/01/2013  . Blindness of left eye     Posttraumatic  . Arthritis     Past Surgical History  Procedure Laterality Date  . Hernia repair Right     inguinal  . Cervical fusion    . Anterior cervical decomp/discectomy fusion N/A 07/01/2012    Procedure: ANTERIOR CERVICAL DECOMPRESSION/DISCECTOMY FUSION CERVICAL FIVE-SIX [name] REMOVAL CERVICAL SIX-SEVEN;  Surgeon: [name], MD;  Location: [facility];  Service: Neurosurgery;  Laterality: N/A;  . Esophagogastroduodenoscopy  04/14/2002    BHA:LPFXTKWI'O' ring, status post dilation as described above/Hiatal hernia, focal antral erosions of uncertain clinical significance  . Colonoscopy   08/04/2002      RMR: Normal rectum/Pancolonic diverticula/Colonic polyps as described above, biopsied and/or snared  . Esophagogastroduodenoscopy  06/09/2009    RMR: normal esophagus s/p dilator/small hiatal hernia otherwise normal  . Colonoscopy  60/08/2009    RMR: normal rectum/left and right sided diverticula/multiple colonic polyps. tubular adenomas. surveillance due 2014  . Colonoscopy  with esophagogastroduodenoscopy (egd) N/A 12/18/2012    Dr. [name]:  Colonic diverticulosis. Multiple colonic polyps-hyperplastic polyps and tubular adenoma. Friable anal canal hemorrhoids. EGD with chronic atrophic gastritis  . Inguinal hernia repair Left 04/15/2014    Procedure: LEFT INGUINAL HERNIORRHAPHY WITH MESH;  Surgeon: [name], MD;  Location: [facility];  Service: General;  Laterality: Left;  . Insertion of mesh Left 04/15/2014    Procedure: INSERTION OF MESH;  Surgeon: [name], MD;  Location: [facility];  Service: General;  Laterality: Left;    There were no vitals filed for this visit.  Visit Diagnosis: Dysphagia, pharyngoesophageal phase      Subjective Assessment - 06/04/14 1312    Subjective "I don't feel like I have trouble swallowing."   Patient is accompained by: Family member   Special Tests MBSS   Currently in Pain? No/denies             General - 06/04/14 1312    General Information   Date of Onset 04/26/14   Other Pertinent Information [name] is a 80 yo gentleman who was referred by Dr. Luan Pulling for MBSS due to concerns of aspiration PNA. Pt treated for PNA in December and April. His PCP is Dr. Karie Kirks and he is accompanied by his wife. He had a stroke in 2002 with some residual left hemiparesis and ACDF surgery in 2014.  Pt denies difficulty swallowing, but states he occasionally has  trouble swallowing pills.    Type of Study --  MBSS   Reason for Referral Objectively evaluate swallowing function   Previous Swallow Assessment None on record; h/o EGD with dilation in 2004   Diet Prior to this Study Regular;Thin liquids   Temperature Spikes Noted No   Respiratory Status Room air   History of Recent Intubation No   Behavior/Cognition Alert;Cooperative;Pleasant mood   Oral Cavity - Dentition Adequate natural dentition/normal for age;Missing dentition   Oral Motor / Sensory Function Within functional limits   Self-Feeding Abilities Able to feed  self   Patient Positioning Upright in chair/Tumbleform   Baseline Vocal Quality Normal   Volitional Cough Strong   Volitional Swallow Able to elicit   Anatomy Other (Comment)  ACDF repair   Pharyngeal Secretions Not observed secondary MBS            Oral Preparation/Oral Phase - Jun 17, 2014 1614    Oral Preparation/Oral Phase   Oral Phase Within functional limits   Electrical stimulation - Oral Phase   Was Electrical Stimulation Used No          Pharyngeal Phase - 2014/06/17 1614    Pharyngeal Phase   Pharyngeal Phase Impaired   Pharyngeal - Thin   Pharyngeal- Thin Cup Swallow initiation at vallecula;Lateral channel residue   Pharyngeal- Thin Straw Lateral channel residue;Swallow initiation at vallecula;Swallow initiation at pyriform sinus   Pharyngeal - Solids   Pharyngeal- Puree Pharyngeal residue - pyriform   Pharyngeal- Mechanical Soft Within functional limits   Pharyngeal- Pill Pharyngeal residue - valleculae  transiently delayed during epiglottic deflection   Pharyngeal Phase - Comment   Pharyngeal Comment Pt noted to have a small amount of residue RIGHT lateral pharynx just beneath epiglottis with thin liquids-? laryngocele although it seemed a little high   Electrical Stimulation - Pharyngeal Phase   Was Electrical Stimulation Used No          Cricopharyngeal Phase - 06-17-2014 1618    Cervical Esophageal Phase   Cervical Esophageal Phase Within functional limits                  Plan - 06-17-14 1620    Clinical Impression Statement Pt presents with mild pharyngeal phase dysphagia, although WFL for age and in setting of prior stroke and ACDF repair. Pt with mild residuals in lateral channels with thin liquids which he independently clears with repeat/dry swallow and mild residuals in pyriforms with puree textures (possibly due to hardware from ACDF). No penetration or aspiration observed; pill transiently delayed during epiglottic deflection. Of note, pt  had some sort of recess (? laryngocele) on RIGHT side just posterior to valleculae which collected trace amounts of thin liquid during swallow. This did not appear to have clinical significance. Recommend regular diet textures and thin liquids with standard aspiration and reflux precautions. Pt encouraged to swallow 2x for each sip. Study reviewed with pt and spouse.   Consulted and Agree with Plan of Care Patient;Family member/caregiver   Family Member Consulted Wife          G-Codes - 06-17-2014 1630    Functional Assessment Tool Used clinical judgement; MBSS   Functional Limitations Swallowing   Swallow Current Status [phone]) 0 percent impaired, limited or restricted   Swallow Goal Status ([id number]) 0 percent impaired, limited or restricted   Swallow Discharge Status [id number] 0 percent impaired, limited or restricted          Recommendations/Treatment - 06/17/2014 1618    Swallow  Evaluation Recommendations   SLP Diet Recommendations Thin  regular textures   Liquid Administration via Cup;Straw   Medication Administration Whole meds with liquid   Supervision Patient able to self feed   Compensations Multiple dry swallows after each bite/sip   Postural Changes Seated upright at 90 degrees          Prognosis - 06/04/14 1618    Prognosis   Prognosis for Safe Diet Advancement Good   Individuals Consulted   Consulted and Agree with Results and Recommendations Patient;Family member/caregiver   Family Member Consulted Wife   Report Sent to  Referring physician      Problem List Patient Active Problem List   Diagnosis Date Noted  . Abnormality of gait 08/01/2013  . Anemia 12/05/2012  . Early satiety 12/03/2012  . History of colon polyps 12/03/2012  . Facial droop 10/12/2012  . Spinal stenosis of cervical region 07/01/2012  . GERD 05/27/2009  . DYSPHAGIA UNSPECIFIED 05/27/2009  . CHANGE IN BOWELS 05/27/2009  . SMOKER 09/09/2008  . HYPERTENSION 09/09/2008  . CVA 09/09/2008  .  CONSTIPATION 09/09/2008  . HIGH BLOOD PRESSURE 07/25/2006   Thank you,  Genene Churn, [place]  [facility] 06/04/2014, 4:31 PM  [facility] [address] [place], [place], [address] Phone: [phone]   Fax:  [phone]

## 2014-06-23 ENCOUNTER — Encounter (HOSPITAL_COMMUNITY): Payer: Medicare Other | Admitting: Speech Pathology

## 2014-06-25 ENCOUNTER — Ambulatory Visit (HOSPITAL_COMMUNITY)
Admission: RE | Admit: 2014-06-25 | Discharge: 2014-06-25 | Disposition: A | Discharge status: Home / Self Care | Payer: Medicare Other | Source: Ambulatory Visit | Attending: Family Medicine | Admitting: Family Medicine

## 2014-06-25 ENCOUNTER — Other Ambulatory Visit (HOSPITAL_COMMUNITY): Payer: Self-pay | Admitting: Family Medicine

## 2014-06-25 DIAGNOSIS — M5412 Radiculopathy, cervical region: Secondary | ICD-10-CM

## 2014-08-04 ENCOUNTER — Encounter: Payer: Self-pay | Admitting: Neurology

## 2014-08-04 ENCOUNTER — Ambulatory Visit (INDEPENDENT_AMBULATORY_CARE_PROVIDER_SITE_OTHER): Payer: Medicare Other | Admitting: Neurology

## 2014-08-04 VITALS — BP 170/92 | HR 64 | Ht 69.0 in | Wt 167.0 lb

## 2014-08-04 DIAGNOSIS — R413 Other amnesia: Secondary | ICD-10-CM | POA: Diagnosis not present

## 2014-08-04 DIAGNOSIS — R269 Unspecified abnormalities of gait and mobility: Secondary | ICD-10-CM

## 2014-08-04 DIAGNOSIS — R202 Paresthesia of skin: Secondary | ICD-10-CM

## 2014-08-04 DIAGNOSIS — R200 Anesthesia of skin: Secondary | ICD-10-CM | POA: Diagnosis not present

## 2014-08-04 DIAGNOSIS — E538 Deficiency of other specified B group vitamins: Secondary | ICD-10-CM

## 2014-08-04 HISTORY — DX: Other amnesia: R41.3

## 2014-08-04 HISTORY — DX: Anesthesia of skin: R20.0

## 2014-08-04 MED ORDER — DONEPEZIL HCL 5 MG PO TABS: 5.0000 mg | ORAL_TABLET | Freq: Every day | ORAL | Status: DC

## 2014-08-04 NOTE — Progress Notes (Signed)
Reason for visit: Memory disorder  Referring physician: Dr. Lemmie Evens  [name] is a 80 y.o. male  History of present illness:  [name] is a 80 year old right-handed black male with a history of cervical spondylosis and cerebrovascular disease. The patient has a chronic gait disorder associated with spasticity of the lower extremities. When previously seen, MRI evaluation of the thoracic spine was done and showed no cord compression. The patient has continued to have spasticity in both legs, he reports no recent falls, he is able to stand for transfers only. He has severe spasticity in the legs with standing. The patient comes in at this point with 2 new medical issues. Three to four months ago, he noted onset of right arm numbness that went down the entirety of the arm including the shoulder, with some pain initially, but the pain has resolved at this point. The patient has continued to have numbness of the hand and arm, slight weakness of grip, and problems with coordination of the arm. He denies any numbness on the face or body. The patient has chronic issues with urinary urgency and occasional incontinence. He has had a progressive issue with memory over the last 6-12 months. The patient denies any dizziness or headaches, he has some decreased vision that is chronic in the left eye. He denies any changes in speech or swallowing. His main issues with the memory are short-term, difficulty with names, and he indicates that he has not given up any activities of daily living because of memory. He requires some assistance during the day for bathing, but he is able to dress himself. He is sent to this office for further evaluation.  Past Medical History  Diagnosis Date  . Hypertension   . Stroke     Left side weakness.  . Shortness of breath     with activity  . Sleep apnea     does not use CPAP.  Tested maybe 5- 10 years ago  . GERD (gastroesophageal reflux disease)   . Constipation    . Glaucoma   . Abnormality of gait 08/01/2013  . Blindness of left eye     Posttraumatic  . Arthritis   . Radiculopathy   . ED (erectile dysfunction)   . Gastroparesis   . Unilateral inguinal hernia   . Osteoarthritis   . Carotid artery disease   . Disc disease, degenerative, cervical   . Overactive bladder   . Heart disease   . Insomnia   . Hyperlipidemia   . Diverticulitis   . Colon polyp   . Diaphragmatic hernia   . Nicotine dependence   . Memory difficulty 08/04/2014  . Numbness and tingling of right arm 08/04/2014    Past Surgical History  Procedure Laterality Date  . Hernia repair Right     inguinal  . Cervical fusion    . Anterior cervical decomp/discectomy fusion N/A 07/01/2012    Procedure: ANTERIOR CERVICAL DECOMPRESSION/DISCECTOMY FUSION CERVICAL FIVE-SIX [name] REMOVAL CERVICAL SIX-SEVEN;  Surgeon: [name], MD;  Location: [facility];  Service: Neurosurgery;  Laterality: N/A;  . Esophagogastroduodenoscopy  04/14/2002    GLO:VFIEPPIR'[id number]' ring, status post dilation as described above/Hiatal hernia, focal antral erosions of uncertain clinical significance  . Colonoscopy   08/04/2002      RMR: Normal rectum/Pancolonic diverticula/Colonic polyps as described above, biopsied and/or snared  . Esophagogastroduodenoscopy  06/09/2009    RMR: normal esophagus s/p dilator/small hiatal hernia otherwise normal  . Colonoscopy  60/08/2009  RMR: normal rectum/left and right sided diverticula/multiple colonic polyps. tubular adenomas. surveillance due 2014  . Colonoscopy with esophagogastroduodenoscopy (egd) N/A 12/18/2012    Dr. [name]:  Colonic diverticulosis. Multiple colonic polyps-hyperplastic polyps and tubular adenoma. Friable anal canal hemorrhoids. EGD with chronic atrophic gastritis  . Inguinal hernia repair Left 04/15/2014    Procedure: LEFT INGUINAL HERNIORRHAPHY WITH MESH;  Surgeon: [name], MD;  Location: [facility];  Service: General;  Laterality: Left;  .  Insertion of mesh Left 04/15/2014    Procedure: INSERTION OF MESH;  Surgeon: [name], MD;  Location: [facility];  Service: General;  Laterality: Left;    Family History  Problem Relation Age of Onset  . Colon cancer Neg Hx   . Cancer Mother   . Dementia Father     Social history:  reports that he has been smoking Cigarettes.  He has a 30 pack-year smoking history. He has never used smokeless tobacco. He reports that he does not drink alcohol or use illicit drugs.  Medications:  Prior to Admission medications   Medication Sig Start Date End Date Taking? Authorizing Provider  Albuterol Sulfate (PROAIR RESPICLICK) 035 (90 BASE) MCG/ACT AEPB Inhale 1-2 puffs into the lungs daily.   Yes Historical Provider, MD  aspirin EC 81 MG tablet Take 81 mg by mouth daily.   Yes Historical Provider, MD  cholecalciferol (VITAMIN D) 1000 UNITS tablet Take 1,000 Units by mouth daily.   Yes Historical Provider, MD  diphenhydrAMINE (BENADRYL) 25 MG tablet Take 25 mg by mouth daily.   Yes Historical Provider, MD  fluticasone (FLONASE) 50 MCG/ACT nasal spray Place 2 sprays into the nose daily as needed for rhinitis.   Yes Historical Provider, MD  LORazepam (ATIVAN) 0.5 MG tablet Take 1 mg by mouth at bedtime as needed for sleep.   Yes Historical Provider, MD  lubiprostone (AMITIZA) 24 MCG capsule Take 1 capsule (24 mcg total) by mouth 2 (two) times daily with a meal. 12/03/12  Yes Orvil Feil, NP  Melatonin 3 MG CAPS Take 3 capsules by mouth at bedtime.   Yes Historical Provider, MD  metoCLOPramide (REGLAN) 10 MG tablet Take 10 mg by mouth at bedtime.   Yes Historical Provider, MD  Multiple Vitamin (MULTIVITAMIN WITH MINERALS) TABS Take 1 tablet by mouth daily.   Yes Historical Provider, MD  nitroGLYCERIN (NITROSTAT) 0.4 MG SL tablet Place 0.4 mg under the tongue every 5 (five) minutes as needed for chest pain.   Yes Historical Provider, MD  omeprazole (PRILOSEC) 20 MG capsule Take 20 mg by mouth daily.   Yes  Historical Provider, MD  sildenafil (REVATIO) 20 MG tablet Take 20 mg by mouth daily. Up to 5 tablets   Yes Historical Provider, MD  solifenacin (VESICARE) 5 MG tablet Take 5 mg by mouth daily.   Yes Historical Provider, MD  Umeclidinium-Vilanterol (ANORO ELLIPTA) 62.5-25 MCG/INH AEPB Inhale 1 puff into the lungs daily.   Yes Historical Provider, MD      Allergies  Allergen Reactions  . Aspirin Nausea And Vomiting    Tolerates 81mg  daily    ROS:  Out of a complete 14 system review of symptoms, the patient complains only of the following symptoms, and all other reviewed systems are negative.  Fatigue Chest pain Blurred vision, loss of vision  Shortness of breath, snoring Feeling cold Constipation Evidence, urinary urgency Joint pain, achy muscles Memory loss, confusion, numbness, weakness Insomnia   Blood pressure 170/92, pulse 64, height 5\' 9"  (  1.753 m), weight 167 lb (75.751 kg).  Physical Exam  General: The patient is alert and cooperative at the time of the examination.  Eyes: Pupils are equal, round, and reactive to light. Discs are flat bilaterally.  Neck: The neck is supple, no carotid bruits are noted.  Respiratory: The respiratory examination is clear.  Cardiovascular: The cardiovascular examination reveals a regular rate and rhythm, no obvious murmurs or rubs are noted.  Skin: Extremities are without significant edema.  Neurologic Exam  Mental status: The patient is alert and oriented x 3 at the time of the examination. The patient has apparent normal recent and remote memory, with an apparently normal attention span and concentration ability. Mini-Mental Status Examination done today shows a total score 24/30. The patient is able to name 7 animals in 30 seconds.  Cranial nerves: Facial symmetry is present. There is good sensation of the face to pinprick and soft touch bilaterally. The strength of the facial muscles and the muscles to head turning and  shoulder shrug are normal bilaterally. Speech is well enunciated, no aphasia or dysarthria is noted. Extraocular movements are full with the right eye, there is exotropia with the left eye. There is decreased vision with the left eye. Visual fields are full with the right eye. The tongue is midline, and the patient has symmetric elevation of the soft palate. No obvious hearing deficits are noted.  Motor: The motor testing reveals 5 over 5 strength of all 4 extremities. Good symmetric motor tone is noted throughout.  Sensory: Sensory testing is intact to pinprick, soft touch, vibration sensation, and position sense on all 4 extremities, With exception of a minimal decrease in position sense in both feet. No evidence of extinction is noted.  Coordination: Cerebellar testing reveals good finger-nose-finger with the left arm, ataxia is noted with finger-nose-finger with the right arm and with heel-to-shin with the legs bilaterally..  Gait and station: Gait is wide-based, ataxic, the patient is able to stand only, cannot be ambulated.  Reflexes: Deep tendon reflexes are symmetric bilaterally, Slightly increased in the lower extremities. Toes are downgoing bilaterally.   Assessment/Plan:  1. Spastic paraparesis  2. Right arm numbness, ataxia  3. Memory disorder  4. Gait disorder   The patient is having new issues with memory, and he has had onset of numbness of the right arm associated with ataxia. The patient will need to be evaluated for cerebrovascular disease, MRI of the brain will be done. He will have blood work today, and he will be set up for nerve conduction studies of both arms, EMG on the right arm. The patient wishes to go on medication for memory, we will start Aricept at this time. He will follow-up in several weeks for the EMG evaluation.   Jill Alexanders MD 08/04/2014 7:15 PM  [facility] [address] [place], [place] [address]  Phone  [phone] Fax [phone]

## 2014-08-04 NOTE — Patient Instructions (Addendum)
We will check blood work today, get an evaluation for the memory with a brain scan. We will start a medication called Aricept (donepezil) for the memory. We will set you up for test called EMG and nerve conduction study, look at the right arm for evidence of nerve injury.  Begin Aricept (donepezil) at 5 mg at night for one month. If this medication is well-tolerated, please call our office and we will call in a prescription for the 10 mg tablets. Look out for side effects that may include nausea, diarrhea, weight loss, or stomach cramps. This medication will also cause a runny nose, therefore there is no need for allergy medications for this purpose.  Fall Prevention and Home Safety Falls cause injuries and can affect all age groups. It is possible to use preventive measures to significantly decrease the likelihood of falls. There are many simple measures which can make your home safer and prevent falls. OUTDOORS  Repair cracks and edges of walkways and driveways.  Remove high doorway thresholds.  Trim shrubbery on the main path into your home.  Have good outside lighting.  Clear walkways of tools, rocks, debris, and clutter.  Check that handrails are not broken and are securely fastened. Both sides of steps should have handrails.  Have leaves, snow, and ice cleared regularly.  Use sand or salt on walkways during winter months.  In the garage, clean up grease or oil spills. BATHROOM  Install night lights.  Install grab bars by the toilet and in the tub and shower.  Use non-skid mats or decals in the tub or shower.  Place a plastic non-slip stool in the shower to sit on, if needed.  Keep floors dry and clean up all water on the floor immediately.  Remove soap buildup in the tub or shower on a regular basis.  Secure bath mats with non-slip, double-sided rug tape.  Remove throw rugs and tripping hazards from the floors. BEDROOMS  Install night lights.  Make sure a  bedside light is easy to reach.  Do not use oversized bedding.  Keep a telephone by your bedside.  Have a firm chair with side arms to use for getting dressed.  Remove throw rugs and tripping hazards from the floor. KITCHEN  Keep handles on pots and pans turned toward the center of the stove. Use back burners when possible.  Clean up spills quickly and allow time for drying.  Avoid walking on wet floors.  Avoid hot utensils and knives.  Position shelves so they are not too high or low.  Place commonly used objects within easy reach.  If necessary, use a sturdy step stool with a grab bar when reaching.  Keep electrical cables out of the way.  Do not use floor polish or wax that makes floors slippery. If you must use wax, use non-skid floor wax.  Remove throw rugs and tripping hazards from the floor. STAIRWAYS  Never leave objects on stairs.  Place handrails on both sides of stairways and use them. Fix any loose handrails. Make sure handrails on both sides of the stairways are as long as the stairs.  Check carpeting to make sure it is firmly attached along stairs. Make repairs to worn or loose carpet promptly.  Avoid placing throw rugs at the top or bottom of stairways, or properly secure the rug with carpet tape to prevent slippage. Get rid of throw rugs, if possible.  Have an electrician put in a light switch at the top and  bottom of the stairs. OTHER FALL PREVENTION TIPS  Wear low-heel or rubber-soled shoes that are supportive and fit well. Wear closed toe shoes.  When using a stepladder, make sure it is fully opened and both spreaders are firmly locked. Do not climb a closed stepladder.  Add color or contrast paint or tape to grab bars and handrails in your home. Place contrasting color strips on first and last steps.  Learn and use mobility aids as needed. Install an electrical emergency response system.  Turn on lights to avoid dark areas. Replace light bulbs  that burn out immediately. Get light switches that glow.  Arrange furniture to create clear pathways. Keep furniture in the same place.  Firmly attach carpet with non-skid or double-sided tape.  Eliminate uneven floor surfaces.  Select a carpet pattern that does not visually hide the edge of steps.  Be aware of all pets. OTHER HOME SAFETY TIPS  Set the water temperature for 120 F (48.8 C).  Keep emergency numbers on or near the telephone.  Keep smoke detectors on every level of the home and near sleeping areas. Document Released: 12/09/2001 Document Revised: 06/20/2011 Document Reviewed: 03/10/2011 [facility] Patient Information 2015 [place], [place]. This information is not intended to replace advice given to you by your health care provider. Make sure you discuss any questions you have with your health care provider.

## 2014-08-06 ENCOUNTER — Telehealth: Payer: Self-pay

## 2014-08-06 LAB — VITAMIN B12: Vitamin B-12: 562 pg/mL (ref 211–946)

## 2014-08-06 LAB — COPPER, SERUM: COPPER: 99 ug/dL (ref 72–166)

## 2014-08-06 LAB — B. BURGDORFI ANTIBODIES

## 2014-08-06 NOTE — Telephone Encounter (Signed)
-----   Message from Kathrynn Ducking, MD sent at 08/06/2014  9:30 AM EDT -----  The blood work results are unremarkable. Please call the patient.  ----- Message -----    From: [facility] Lab Results In Interface    Sent: 08/05/2014   7:43 AM      To: Kathrynn Ducking, MD

## 2014-08-06 NOTE — Telephone Encounter (Signed)
I called the patient and relayed results. 

## 2014-08-19 ENCOUNTER — Encounter: Payer: Self-pay | Admitting: Neurology

## 2014-08-19 ENCOUNTER — Ambulatory Visit (INDEPENDENT_AMBULATORY_CARE_PROVIDER_SITE_OTHER): Payer: Self-pay | Admitting: Neurology

## 2014-08-19 ENCOUNTER — Ambulatory Visit (INDEPENDENT_AMBULATORY_CARE_PROVIDER_SITE_OTHER): Payer: Medicare Other | Admitting: Neurology

## 2014-08-19 DIAGNOSIS — M5412 Radiculopathy, cervical region: Secondary | ICD-10-CM | POA: Diagnosis not present

## 2014-08-19 DIAGNOSIS — R269 Unspecified abnormalities of gait and mobility: Secondary | ICD-10-CM

## 2014-08-19 DIAGNOSIS — R413 Other amnesia: Secondary | ICD-10-CM

## 2014-08-19 DIAGNOSIS — R200 Anesthesia of skin: Secondary | ICD-10-CM

## 2014-08-19 DIAGNOSIS — R202 Paresthesia of skin: Secondary | ICD-10-CM

## 2014-08-19 NOTE — Progress Notes (Signed)
Please refer to EMG and nerve conduction study procedure note. 

## 2014-08-19 NOTE — Procedures (Signed)
     HISTORY:  [name] is a 80 year old gentleman with history of cervical spondylosis, with prior cervical spine surgery and a chronic gait disorder with spasticity in the lower extremities.   NERVE CONDUCTION STUDIES:  Nerve conduction studies were performed on both upper extremities. The distal motor latencies and motor amplitudes for the median and ulnar nerves were within normal limits. The F wave latencies and nerve conduction velocities for these nerves were also normal. The sensory latencies for the median and ulnar nerves were normal.   EMG STUDIES:  EMG study was performed on the right upper extremity:  The first dorsal interosseous muscle reveals 2 to 6 K units with decreased recruitment. No fibrillations or positive waves were noted. The abductor pollicis brevis muscle reveals 2 to 6 K units with decreased recruitment. No fibrillations or positive waves were noted. The extensor indicis proprius muscle reveals 2 to 6 K units with moderately decreased recruitment. No fibrillations or positive waves were noted. The pronator teres muscle reveals 2 to 8 K units with decreased recruitment. No fibrillations or positive waves were noted. The biceps muscle reveals 1 to 2 K units with full recruitment. No fibrillations or positive waves were noted. The triceps muscle reveals 2 to 8 K units with decreased recruitment. No fibrillations or positive waves were noted. The anterior deltoid muscle reveals 2 to 3 K units with full recruitment. No fibrillations or positive waves were noted. The cervical paraspinal muscles were tested at 2 levels. No abnormalities of insertional activity were seen at either level tested. There was good relaxation.   IMPRESSION:  Nerve conduction studies done on both upper extremities were within normal limits. No evidence of a neuropathy is seen. EMG evaluation of the right upper extremity shows findings consistent with a chronic stable C7 and C8  radiculopathy, possibly involving the T1 level as well. No acute findings were noted.  Jill Alexanders MD 08/19/2014 4:33 PM  [facility] [address] [place], [place] [address]  Phone [phone] Fax [phone]

## 2014-08-20 ENCOUNTER — Other Ambulatory Visit: Payer: Self-pay | Admitting: Neurology

## 2014-08-20 ENCOUNTER — Ambulatory Visit
Admission: RE | Admit: 2014-08-20 | Discharge: 2014-08-20 | Disposition: A | Discharge status: Home / Self Care | Payer: Medicare Other | Source: Ambulatory Visit | Attending: Neurology | Admitting: Neurology

## 2014-08-20 DIAGNOSIS — R202 Paresthesia of skin: Secondary | ICD-10-CM

## 2014-08-20 DIAGNOSIS — R269 Unspecified abnormalities of gait and mobility: Secondary | ICD-10-CM | POA: Diagnosis not present

## 2014-08-20 DIAGNOSIS — R200 Anesthesia of skin: Secondary | ICD-10-CM

## 2014-08-20 DIAGNOSIS — R413 Other amnesia: Secondary | ICD-10-CM

## 2014-08-21 ENCOUNTER — Telehealth: Payer: Self-pay | Admitting: Neurology

## 2014-08-21 NOTE — Telephone Encounter (Signed)
I've tried to reach patient to ask how he is doing on this dose, to no avail.  Refilling same dose so he not without medication over the weekend, noting that he please call us.

## 2014-08-21 NOTE — Telephone Encounter (Signed)
  I called the patient. MRI the brain shows small vessel disease in the periventricular areas, significant right greater than left cerebellar involvement that is chronic, no obvious new changes. The EMG and nerve conduction study showed significant chronic stable C7 and C8 level nerve root injury. The patient reports no changes with his right arm function. He mainly has numbness in the hand and arm.  MRI brain 08/21/14:  IMPRESSION: Abnormal MRI scan of the brain showing mild to moderate changes of chronic microvascular ischemia and generalized cerebral atrophy. Remote age bilateral cerebellar infarcts are noted. Overall improvement in the right sided soft tissue swelling and otherwise stable appearance compared with MRI dated 10/14/2012

## 2014-10-20 ENCOUNTER — Other Ambulatory Visit: Payer: Self-pay | Admitting: Neurology

## 2014-10-20 NOTE — Telephone Encounter (Signed)
I spoke with the patient who says he feels good on the Aricpet 5mg , and would like to proceed to 10mg  dose.  I asked him to call us back if he notices any changes on this dose.  Patient expressed understanding and was agreeable to this plan.

## 2014-12-08 ENCOUNTER — Other Ambulatory Visit (HOSPITAL_COMMUNITY): Payer: Self-pay | Admitting: Family Medicine

## 2014-12-08 ENCOUNTER — Ambulatory Visit (HOSPITAL_COMMUNITY)
Admission: RE | Admit: 2014-12-08 | Discharge: 2014-12-08 | Disposition: A | Discharge status: Home / Self Care | Payer: Medicare Other | Source: Ambulatory Visit | Attending: Family Medicine | Admitting: Family Medicine

## 2014-12-08 DIAGNOSIS — M545 Low back pain: Secondary | ICD-10-CM | POA: Diagnosis present

## 2014-12-08 DIAGNOSIS — I700 Atherosclerosis of aorta: Secondary | ICD-10-CM | POA: Insufficient documentation

## 2014-12-08 DIAGNOSIS — M5441 Lumbago with sciatica, right side: Secondary | ICD-10-CM

## 2014-12-08 DIAGNOSIS — M5442 Lumbago with sciatica, left side: Principal | ICD-10-CM

## 2014-12-08 DIAGNOSIS — M25552 Pain in left hip: Secondary | ICD-10-CM | POA: Diagnosis not present

## 2014-12-21 ENCOUNTER — Other Ambulatory Visit (HOSPITAL_COMMUNITY): Payer: Self-pay | Admitting: Family Medicine

## 2014-12-21 DIAGNOSIS — M25551 Pain in right hip: Secondary | ICD-10-CM

## 2014-12-22 ENCOUNTER — Other Ambulatory Visit: Payer: Self-pay | Admitting: Neurology

## 2015-01-01 ENCOUNTER — Ambulatory Visit (HOSPITAL_COMMUNITY)
Admission: RE | Admit: 2015-01-01 | Discharge: 2015-01-01 | Disposition: A | Discharge status: Home / Self Care | Payer: Medicare Other | Source: Ambulatory Visit | Attending: Family Medicine | Admitting: Family Medicine

## 2015-01-01 DIAGNOSIS — R937 Abnormal findings on diagnostic imaging of other parts of musculoskeletal system: Secondary | ICD-10-CM | POA: Diagnosis not present

## 2015-01-01 DIAGNOSIS — M545 Low back pain: Secondary | ICD-10-CM | POA: Insufficient documentation

## 2015-01-01 DIAGNOSIS — M25551 Pain in right hip: Secondary | ICD-10-CM | POA: Insufficient documentation

## 2015-01-05 MED ORDER — MIDAZOLAM HCL 2 MG/2ML IJ SOLN
INTRAMUSCULAR | Status: AC
  Filled 2015-01-05: qty 2

## 2015-01-05 MED ORDER — FENTANYL CITRATE (PF) 100 MCG/2ML IJ SOLN
INTRAMUSCULAR | Status: AC
  Filled 2015-01-05: qty 2

## 2015-01-07 ENCOUNTER — Ambulatory Visit (HOSPITAL_COMMUNITY): Payer: Medicare Other

## 2015-01-19 ENCOUNTER — Ambulatory Visit (INDEPENDENT_AMBULATORY_CARE_PROVIDER_SITE_OTHER): Payer: Medicare Other | Admitting: Orthopedic Surgery

## 2015-01-19 ENCOUNTER — Encounter: Payer: Self-pay | Admitting: Orthopedic Surgery

## 2015-01-19 VITALS — BP 137/83 | Ht 69.0 in | Wt 167.0 lb

## 2015-01-19 DIAGNOSIS — M4806 Spinal stenosis, lumbar region: Secondary | ICD-10-CM

## 2015-01-19 DIAGNOSIS — M48061 Spinal stenosis, lumbar region without neurogenic claudication: Secondary | ICD-10-CM

## 2015-01-19 NOTE — Progress Notes (Signed)
Consult   Patient ID: [name], male   DOB: 1934/08/24, 81 y.o.   MRN: [id number]  Chief Complaint  Patient presents with  . Hip Pain    Bilateral hip pain, Referred by Dr. Karie Kirks for consult.    HPI [name] is a 81 y.o. male.  81 year old male presents for evaluation of bilateral groin pain however the patient has a history of long-standing spinal stenosis. He also had a cervical spine fusion about 2 years ago and presents also with persistent or I should say recurrent right upper extremity pain numbness reading from his neck to his hand. Surgery performed by Dr. Trenton Gammon  The patient notes that over the last 3 months his ability to walk has decreased is now full assistance help from his wife can get dressed can walk can't stand. He has back pain radiating down to his groin with normal x-rays of his hip and normal X MRI of his hip his right leg is worse than the left. He has sharp throbbing burning aching radiating pain unrelieved by Aleve with stiffness numbness and tingling in the lower extremities nothing has made it better and is worse when he moves or lies on his right side.  His review of systems is notable for recent weight loss fever and chills and fatigue and constipation he has take medicine to move his bowels. He had blood work which was normal he had a colonoscopy which was normal he had a prostate exam in the last few years including normal findings  She also has sinusitis vision problems shortness of breath cough depression anxiety temperature intolerance or excessive nighttime urination constipation numbness burning pain in his legs with weakness and tremors seasonal allergies joint pain limb pain muscle weakness and back pain    Review of Systems Review of Systems see history   Past Medical History  Diagnosis Date  . Hypertension   . Stroke     Left side weakness.  . Shortness of breath     with activity  . Sleep apnea     does not use CPAP.  Tested maybe 5- 10  years ago  . GERD (gastroesophageal reflux disease)   . Constipation   . Glaucoma   . Abnormality of gait 08/01/2013  . Blindness of left eye     Posttraumatic  . Arthritis   . Radiculopathy   . ED (erectile dysfunction)   . Gastroparesis   . Unilateral inguinal hernia   . Osteoarthritis   . Carotid artery disease   . Disc disease, degenerative, cervical   . Overactive bladder   . Heart disease   . Insomnia   . Hyperlipidemia   . Diverticulitis   . Colon polyp   . Diaphragmatic hernia   . Nicotine dependence   . Memory difficulty 08/04/2014  . Numbness and tingling of right arm 08/04/2014    Past Surgical History  Procedure Laterality Date  . Hernia repair Right     inguinal  . Cervical fusion    . Anterior cervical decomp/discectomy fusion N/A 07/01/2012    Procedure: ANTERIOR CERVICAL DECOMPRESSION/DISCECTOMY FUSION CERVICAL FIVE-SIX [name] REMOVAL CERVICAL SIX-SEVEN;  Surgeon: [name], MD;  Location: [facility];  Service: Neurosurgery;  Laterality: N/A;  . Esophagogastroduodenoscopy  04/14/2002    [id number] ring, status post dilation as described above/Hiatal hernia, focal antral erosions of uncertain clinical significance  . Colonoscopy   08/04/2002      RMR: Normal rectum/Pancolonic diverticula/Colonic polyps as described above, biopsied and/or  snared  . Esophagogastroduodenoscopy  06/09/2009    RMR: normal esophagus s/p dilator/small hiatal hernia otherwise normal  . Colonoscopy  60/08/2009    RMR: normal rectum/left and right sided diverticula/multiple colonic polyps. tubular adenomas. surveillance due 2014  . Colonoscopy with esophagogastroduodenoscopy (egd) N/A 12/18/2012    Dr. [name]:  Colonic diverticulosis. Multiple colonic polyps-hyperplastic polyps and tubular adenoma. Friable anal canal hemorrhoids. EGD with chronic atrophic gastritis  . Inguinal hernia repair Left 04/15/2014    Procedure: LEFT INGUINAL HERNIORRHAPHY WITH MESH;  Surgeon: [name], MD;  Location: [facility];  Service: General;  Laterality: Left;  . Insertion of mesh Left 04/15/2014    Procedure: INSERTION OF MESH;  Surgeon: [name], MD;  Location: [facility];  Service: General;  Laterality: Left;    Family History  Problem Relation Age of Onset  . Colon cancer Neg Hx   . Cancer Mother   . Dementia Father     Social History Social History  Substance Use Topics  . Smoking status: Current Every Day Smoker -- 0.50 packs/day for 60 years    Types: Cigarettes  . Smokeless tobacco: Never Used  . Alcohol Use: No    Allergies  Allergen Reactions  . Aspirin Nausea And Vomiting    Tolerates 81mg  daily    Current Outpatient Prescriptions  Medication Sig Dispense Refill  . Albuterol Sulfate (PROAIR RESPICLICK) 123XX123 (90 BASE) MCG/ACT AEPB Inhale 1-2 puffs into the lungs daily.    [name] aspirin EC 81 MG tablet Take 81 mg by mouth daily.    . cholecalciferol (VITAMIN D) 1000 UNITS tablet Take 1,000 Units by mouth daily.    . diphenhydrAMINE (BENADRYL) 25 MG tablet Take 25 mg by mouth daily.    [name] donepezil (ARICEPT) 10 MG tablet TAKE (1) TABLET BY MOUTH AT BEDTIME. 30 tablet 3  . fluticasone (FLONASE) 50 MCG/ACT nasal spray Place 2 sprays into the nose daily as needed for rhinitis.    [name] LORazepam (ATIVAN) 0.5 MG tablet Take 1 mg by mouth at bedtime as needed for sleep.    [name] lubiprostone (AMITIZA) 24 MCG capsule Take 1 capsule (24 mcg total) by mouth 2 (two) times daily with a meal. 60 capsule 11  . Melatonin 3 MG CAPS Take 3 capsules by mouth at bedtime.    . metoCLOPramide (REGLAN) 10 MG tablet Take 10 mg by mouth at bedtime.    . Multiple Vitamin (MULTIVITAMIN WITH MINERALS) TABS Take 1 tablet by mouth daily.    . nitroGLYCERIN (NITROSTAT) 0.4 MG SL tablet Place 0.4 mg under the tongue every 5 (five) minutes as needed for chest pain.    [name] omeprazole (PRILOSEC) 20 MG capsule Take 20 mg by mouth daily.    . sildenafil (REVATIO) 20 MG tablet Take 20 mg by mouth  daily. Up to 5 tablets    . solifenacin (VESICARE) 5 MG tablet Take 5 mg by mouth daily.    [name] Umeclidinium-Vilanterol (ANORO ELLIPTA) 62.5-25 MCG/INH AEPB Inhale 1 puff into the lungs daily.     No current facility-administered medications for this visit.       Physical Exam Physical Exam Blood pressure 137/83, height 5\' 9"  (1.753 m), weight 167 lb (75.751 kg). Appearance, there are no abnormalities in terms of appearance the patient was well-developed and well-nourished. The grooming and hygiene were normal.  Mental status orientation, there was normal alertness and orientation Mood pleasant Ambulatory status abnormal he is full assist sit to stand any basically wheelchair-bound  Upper extremity show no gross abnormalities on alignment range of motion stability and no atrophy neurovascular exam remains intact despite upper extremity pain and radicular symptoms  Examination of the lumbar spine reveal tenderness at the L4-5 disc space and also on the   right gluteal area  and lower back.  Decreased spinal flexion extension and rotation were noted.  The lower extremities exhibited normal dorsiflexion plantar flexion extension flexion of the knee as well as hip flexion. No atrophy was noted. The skin overlying the lumbar thoracic and cervical regions was warm dry and intact without laceration. The lower extremities also had normal skin. Specifically had no pain on provocative movers of the hip including flexion log roll push pull  Neurologic examination  Reflexes were 0+ and equal  Sensation was normal in both feet and legs  Babinski's tests were down going  Straight leg raise testing was normal bilaterally  The vascular examination revealed normal dorsalis pedis pulses in both feet and both feet were warm with good capillary refill   Data Reviewed Cervical spine x-ray dated June 2016 cervical spine fusion noted with degenerative changes in the left neural foramen at C2-3 and C3-4  large calcification in the carotid artery anterior plate fixation [id number] and C6-7 hip x-rays I personally reviewed these and they are normal no arthritis.  Lumbar spine x-ray had reviewed those he has chronic lumbar facet degeneration worse at L3-4 but noted throughout the lower spine  MRI of right hip the patient had no trauma but there was fluid suggesting hematoma in the left gluteus maximus and left vastus lateralis and again he is more symptomatic on his right side and these are not the areas of pain  Dr. Vickey Sages notes include all medications and blood pressure findings    Assessment  Spinal stenosis lumbar spine   Plan  MRI, call patient and then referred to neurosurgery

## 2015-02-03 ENCOUNTER — Ambulatory Visit (HOSPITAL_COMMUNITY)
Admission: RE | Admit: 2015-02-03 | Discharge: 2015-02-03 | Disposition: A | Discharge status: Home / Self Care | Payer: Medicare Other | Source: Ambulatory Visit | Attending: Orthopedic Surgery | Admitting: Orthopedic Surgery

## 2015-02-03 DIAGNOSIS — M48061 Spinal stenosis, lumbar region without neurogenic claudication: Secondary | ICD-10-CM

## 2015-02-03 DIAGNOSIS — M4806 Spinal stenosis, lumbar region: Secondary | ICD-10-CM | POA: Insufficient documentation

## 2015-03-30 ENCOUNTER — Encounter (HOSPITAL_COMMUNITY): Payer: Self-pay

## 2015-03-31 ENCOUNTER — Ambulatory Visit (HOSPITAL_COMMUNITY): Payer: Medicare Other | Attending: Anesthesiology

## 2015-03-31 DIAGNOSIS — Z658 Other specified problems related to psychosocial circumstances: Secondary | ICD-10-CM | POA: Insufficient documentation

## 2015-03-31 DIAGNOSIS — R2681 Unsteadiness on feet: Secondary | ICD-10-CM | POA: Insufficient documentation

## 2015-03-31 DIAGNOSIS — R262 Difficulty in walking, not elsewhere classified: Secondary | ICD-10-CM | POA: Diagnosis present

## 2015-03-31 DIAGNOSIS — M5416 Radiculopathy, lumbar region: Secondary | ICD-10-CM | POA: Diagnosis present

## 2015-03-31 DIAGNOSIS — R29898 Other symptoms and signs involving the musculoskeletal system: Secondary | ICD-10-CM | POA: Diagnosis present

## 2015-03-31 DIAGNOSIS — M79604 Pain in right leg: Secondary | ICD-10-CM | POA: Diagnosis present

## 2015-03-31 DIAGNOSIS — Z7409 Other reduced mobility: Secondary | ICD-10-CM

## 2015-04-01 NOTE — Therapy (Signed)
[facility] [address] [place], [place], [address] Phone: [phone]   Fax:  [phone]  Physical Therapy Evaluation  Patient Details  Name: [name] MRN: [id number] Date of Birth: Dec 08, 1934 Referring Provider: Dr. Clydell Hakim  Encounter Date: 03/31/2015      PT End of Session - 03/31/15 1954    Visit Number 1   Number of Visits 22   Date for PT Re-Evaluation 04/30/15   Authorization Type [employer] Medicare   Authorization Time Period 03/31/2015 to 05/19/2015    PT Start Time 1615   PT Stop Time 1710   PT Time Calculation (min) 55 min   Equipment Utilized During Treatment Gait belt   Activity Tolerance Patient tolerated treatment well;Patient limited by pain   Behavior During Therapy [facility] for tasks assessed/performed      Past Medical History  Diagnosis Date  . Hypertension   . Stroke [facility])     Left side weakness.  . Shortness of breath     with activity  . Sleep apnea     does not use CPAP.  Tested maybe 5- 10 years ago  . GERD (gastroesophageal reflux disease)   . Constipation   . Glaucoma   . Abnormality of gait 08/01/2013  . Blindness of left eye     Posttraumatic  . Arthritis   . Radiculopathy   . ED (erectile dysfunction)   . Gastroparesis   . Unilateral inguinal hernia   . Osteoarthritis   . Carotid artery disease ([place])   . Disc disease, degenerative, cervical   . Overactive bladder   . Heart disease   . Insomnia   . Hyperlipidemia   . Diverticulitis   . Colon polyp   . Diaphragmatic hernia   . Nicotine dependence   . Memory difficulty 08/04/2014  . Numbness and tingling of right arm 08/04/2014    Past Surgical History  Procedure Laterality Date  . Hernia repair Right     inguinal  . Cervical fusion    . Anterior cervical decomp/discectomy fusion N/A 07/01/2012    Procedure: ANTERIOR CERVICAL DECOMPRESSION/DISCECTOMY FUSION CERVICAL FIVE-SIX [name] REMOVAL CERVICAL SIX-SEVEN;  Surgeon: Benjamin Pitter,  MD;  Location: [facility];  Service: Neurosurgery;  Laterality: N/A;  . Esophagogastroduodenoscopy  04/14/2002    [id number] ring, status post dilation as described above/Hiatal hernia, focal antral erosions of uncertain clinical significance  . Colonoscopy   08/04/2002      RMR: Normal rectum/Pancolonic diverticula/Colonic polyps as described above, biopsied and/or snared  . Esophagogastroduodenoscopy  06/09/2009    RMR: normal esophagus s/p dilator/small hiatal hernia otherwise normal  . Colonoscopy  60/08/2009    RMR: normal rectum/left and right sided diverticula/multiple colonic polyps. tubular adenomas. surveillance due 2014  . Colonoscopy with esophagogastroduodenoscopy (egd) N/A 12/18/2012    Dr. [name]:  Colonic diverticulosis. Multiple colonic polyps-hyperplastic polyps and tubular adenoma. Friable anal canal hemorrhoids. EGD with chronic atrophic gastritis  . Inguinal hernia repair Left 04/15/2014    Procedure: LEFT INGUINAL HERNIORRHAPHY WITH MESH;  Surgeon: [name], MD;  Location: [facility];  Service: General;  Laterality: Left;  . Insertion of mesh Left 04/15/2014    Procedure: INSERTION OF MESH;  Surgeon: [name], MD;  Location: [facility];  Service: General;  Laterality: Left;    There were no vitals filed for this visit.  Visit Diagnosis:  Radiculopathy, lumbar region - Plan: PT plan of care cert/re-cert  Pain of right leg - Plan: PT  plan of care cert/re-cert  Weakness of both legs - Plan: PT plan of care cert/re-cert  Difficulty in walking, not elsewhere classified - Plan: PT plan of care cert/re-cert  Decreased independence with transfers - Plan: PT plan of care cert/re-cert  Unsteadiness on feet - Plan: PT plan of care cert/re-cert      Subjective Assessment - 03/31/15 1618    Subjective [name] is a 81 yo male who currently c/o R LE pain and occaional tingling of the R LE that insidiously began ~4 months ago. Patient and spouse reported that a  recent MRI indicated "pinched nerves" of his low back. Patient had an injection to the low back at the beginning of this month, which significantly reduced his low back pain. In addition, patient reports that he is unable to stand or ambulate independnetly, which began 4 years ago.    Patient is accompained by: Family member   Pertinent History HTN, CVA (2002), cervical fusion x 2, CAD, and blind in L eye    Limitations Walking;Standing   How long can you sit comfortably? Unlimited    How long can you stand comfortably? Few seconds    How long can you walk comfortably? Non-ambulatory    Diagnostic tests MRI= Indicated "pinched nerves"    Patient Stated Goals Patient's goal is to reduce his R LE pain and be able to stand/walk again if possible.    Currently in Pain? Yes   Pain Score 2  R LE pain ranges between a 0-8/10 on a VAS   Pain Location Groin  Leg   Pain Orientation Right   Pain Descriptors / Indicators Burning   Pain Type Chronic pain   Pain Radiating Towards into the R LE and into the R foot    Pain Onset More than a month ago   Pain Frequency Intermittent   Aggravating Factors  turning over in bed   Pain Relieving Factors pain meds and rest    Effect of Pain on Daily Activities Limiting his ability to complete standing activities and roll in bed             [facility] PT Assessment - 04/01/15 0001    Assessment   Medical Diagnosis Lumbar radiculopathy    Referring Provider Dr. Clydell Hakim   Onset Date/Surgical Date 12/03/14   Hand Dominance Right   Next MD Visit April 05, 2015    Precautions   Precautions Fall;Cervical   Precaution Comments hx of Cervical fusion x 2    Restrictions   Weight Bearing Restrictions No   Balance Screen   Has the patient fallen in the past 6 months Yes   How many times? 1   Has the patient had a decrease in activity level because of a fear of falling?  Yes   Is the patient reluctant to leave their home because of a fear of falling?  Yes    [place] Private residence   Living Arrangements Spouse/significant other   Type of [place] Access Level entry   [place] - power;Walker - 2 wheels;Walker - 4 wheels;Grab bars - tub/shower;Shower seat   Prior Function   Level of Independence Needs assistance with ADLs;Needs assistance with transfers   Vocation Retired   Leisure Restaurant manager, fast food   Cognition   Overall Cognitive Status Within Functional Limits for tasks assessed   Memory Impaired   Observation/Other  Assessments   Focus on Therapeutic Outcomes (FOTO)  54%   Sensation   Light Touch Appears Intact   Coordination   Finger Nose Finger Test [place], bilaterally   Heel Shin Test WNL   Posture/Postural Control   Posture/Postural Control Postural limitations   Postural Limitations Rounded Shoulders;Forward head;Flexed trunk   ROM / Strength   AROM / PROM / Strength Strength   AROM   Overall AROM Comments B hip PROM is [facility]   AROM Assessment Site Lumbar   Lumbar Flexion WFL   Lumbar Extension NT   Lumbar - Right Side Bend 15   Lumbar - Left Side Bend 15   Lumbar - Right Rotation NT   Lumbar - Left Rotation NT   Strength   Strength Assessment Site Hip;Knee;Ankle   Right/Left Hip Right;Left   Right Hip Flexion 3+/5   Right Hip Extension 3/5   Right Hip ABduction 3+/5   Left Hip Flexion 3+/5   Left Hip Extension 3/5   Left Hip ABduction 3+/5   Right Knee Flexion 4+/5   Right Knee Extension 5/5   Left Knee Flexion 4/5   Left Knee Extension 5/5   Right Ankle Dorsiflexion 4/5   Left Ankle Dorsiflexion 4/5   Flexibility   Soft Tissue Assessment /Muscle Length yes   Hamstrings Mod tightness, bilateral    Piriformis Mod tightness, bilateral    Palpation   Spinal mobility Unable to assess   Palpation comment TTP of lumbar paraspinals and R glutel region    Bed Mobility   Bed Mobility Sit to Supine;Supine to  Sit   Supine to Sit 4: Min assist   Sit to Supine 4: Min assist   Transfers   Transfers Sit to Stand;Stand to Sit;Stand Pivot Transfers   Sit to Stand 3: Mod assist   Sit to Stand Details (indicate cue type and reason) cues required for proper technique and hand placement    Stand to Sit 3: Mod assist   Stand to Sit Details cues required for proper technique and hand placement    Stand Pivot Transfers 3: Mod assist   Stand Pivot Transfer Details (indicate cue type and reason) cues required for proper technique and hand placement    Ambulation/Gait   Ambulation/Gait No   Gait Comments  non-ambulatory ( last 3 years)    Static Standing Balance   Static Standing - Balance Support Bilateral upper extremity supported   Static Standing - Level of Assistance 3: Mod assist;Other (comment)  static stand; unable to maintain for >6 sec sec pain/weak                           PT Education - 03/31/15 1957    Education provided Yes   Education Details Edcuated pt and spouse on PT eval findings, initial HEP, and pain management strategies   Person(s) Educated Patient;Spouse   Methods Explanation;Demonstration   Comprehension Verbalized understanding;Returned demonstration          PT Short Term Goals - 03/31/15 1951    PT SHORT TERM GOAL #1   Title Patient/spouse will independently verbalize and demo proper completion of initial HEP to continue with core/LE strengthening and LE stretches.   Time 2   Period Weeks   Status New   PT SHORT TERM GOAL #2   Title Patient will independently verbalize 5/5 fall precautions in order to reduce the risk for falls with functional mobility activities.    Time  2   Period Weeks   Status New   PT SHORT TERM GOAL #3   Title Patient will report decreased max R LE radicular pain to 5/10 on a VAS in order to improve quality of life and tolerance with lying in supine.    Time 3   Period Weeks   Status New   PT SHORT TERM GOAL #4   Title  Patient will complete sit<>stand transfer with CGA x1 with less difficulty assessed in order to imrpove safety and independence with transfers.    Time 4   Period Weeks   Status New           PT Long Term Goals - 03/31/15 1950    PT LONG TERM GOAL #1   Title  Patient/spouse will independently verbalize and demo proper completion of advanced HEP to continue with core/LE strengthening and LE stretches once DC from PT.    Time 7   Period Weeks   Status New   PT LONG TERM GOAL #2   Title Patient will report decreased R LE radicular pain to 0-3/10 with less exacerbations in order to improve ability to complete transfers with less pain and limitations.    Time 7   Period Weeks   Status New   PT LONG TERM GOAL #3   Title Patient will improve B hip and core strength by 1 MMT grade in order to imrpove performance with transfers.    Time 7   Period Weeks   Status New   PT LONG TERM GOAL #4   Title Patient will improve his FOTO limitation score to <40% in order to progress towards his PLOF.   Time 7   Period Weeks   Status New   PT LONG TERM GOAL #5   Title Patient will be able to statically stand for 30 seconds with B UE support on FWW in order to improve balance with ADLs.    Time 7   Period Weeks   Status New               Plan - 03/31/15 1953    Clinical Impression Statement Mr. Trottier is a 81 year old male who was referred to outpatient physical therapy secondary to recent diagnosis of lumbar radiculopathy. The patient presents with signs and symptoms that are consistent with referring MD diagnosis. The patient is significantly deconditioned and is chair bound at this time. According to the patient and his spouse, the patient has not been ambulatory for over three years and has a hx of CVA. The patient requires assistance from his spouse to complete all ADLs and tranfer due to his deconditioned state. Upon examination, patient demonstrated difficulty with sit to stand transfer  and required mod assist x1 in order to maintain standing balance. He also presented with decreased coordination with finger to nose testing. Furthermore, patient presented with decreased bilateral LE strength, impaired flexibility of hip flexors/piriformis/hamstring, impaired postural/core strength, and right LE radicular pain. Right LE radicular pain increased when placed in a supine position. Symptoms were relieved with long axis distraction of the right hip in the open pack position and with knees to chest exercise. Symptoms were completely reduced once transferred back into a short sit position. Instructed the patient and spouse on seated transversus abdominis activation and seated lumbar flexion in order to reduce symptoms and improve core stabilization. Good tolerance reported with initial prescribed ther ex. Mr. Zeh would benefit from skilled physical therapy for 3X/week for 7 weeks in order  to reduce pain levels, improve postural/core strength, improve lower extremity strength, improve balance, and improve functional mobility in order to reduce the risk for falls. Discussed possible referral to home physical therapy if patient has difficulty attending outpatient physical therapy due to his deconditioned state. The patient and his spouse verbalized full understanding and agreement with outpatient physical therapy POC and frequency with possible referral to homecare physical therapy if required.   Pt will benefit from skilled therapeutic intervention in order to improve on the following deficits Difficulty walking;Postural dysfunction;Decreased activity tolerance;Decreased mobility;Decreased balance;Decreased range of motion;Pain;Decreased coordination   Rehab Potential Fair   Clinical Impairments Affecting Rehab Potential B LE weakness and non-ambulatory    PT Frequency 3x / week   PT Duration Other (comment)  7 weeks    PT Treatment/Interventions ADLs/Self Care Home Management;Cryotherapy;Electrical  Stimulation;Moist Heat;Gait training;DME Instruction;Therapeutic activities;Therapeutic exercise;Balance training;Neuromuscular re-education;Patient/family education;Taping;Passive range of motion;Manual techniques   PT Next Visit Plan Next visit to focus on seated core stabilization, seated lumbar flexion, seated hip abduction strengthening, R LE distraction, and SKTC/DKTC.    PT Home Exercise Plan HEP initiated this visit with addition of seated transversus abdominis activation and seated lumbar flexion; Review HEP and add seated SKTC and seated piriformis stretch    Recommended Other Services Possible homecare PT if patient has difficulty attending outpatient PT due to his deconditioned state    Consulted and Agree with Plan of Care Patient;Family member/caregiver   Family Member Consulted Spouse           G-Codes - 2015-04-29 1952    Functional Assessment Tool Used Based on clinical findings ( MMT, balance, transfers, pain levels, ROM)    Functional Limitation Mobility: Walking and moving around   Mobility: Walking and Moving Around Current Status [phone]) At least 60 percent but less than 80 percent impaired, limited or restricted   Mobility: Walking and Moving Around Goal Status [phone]) At least 40 percent but less than 60 percent impaired, limited or restricted       Problem List Patient Active Problem List   Diagnosis Date Noted  . Memory difficulty 08/04/2014  . Numbness and tingling of right arm 08/04/2014  . Abnormality of gait 08/01/2013  . Anemia 12/05/2012  . Early satiety 12/03/2012  . History of colon polyps 12/03/2012  . Facial droop 10/12/2012  . Spinal stenosis of cervical region 07/01/2012  . GERD 05/27/2009  . DYSPHAGIA UNSPECIFIED 05/27/2009  . CHANGE IN BOWELS 05/27/2009  . SMOKER 09/09/2008  . HYPERTENSION 09/09/2008  . CVA 09/09/2008  . CONSTIPATION 09/09/2008  . HIGH BLOOD PRESSURE 07/25/2006    Garen Lah, PT, DPT  04/01/2015, 12:59 PM  [facility] [address] [place], [place], [address] Phone: [phone]   Fax:  [phone]  Name: [name] MRN: [id number] Date of Birth: September 18, 1934

## 2015-04-07 ENCOUNTER — Encounter (HOSPITAL_COMMUNITY): Payer: Self-pay

## 2015-04-07 ENCOUNTER — Ambulatory Visit (HOSPITAL_COMMUNITY): Payer: Medicare Other | Attending: Anesthesiology

## 2015-04-07 DIAGNOSIS — Z658 Other specified problems related to psychosocial circumstances: Secondary | ICD-10-CM | POA: Insufficient documentation

## 2015-04-07 DIAGNOSIS — M6281 Muscle weakness (generalized): Secondary | ICD-10-CM | POA: Insufficient documentation

## 2015-04-07 DIAGNOSIS — M5416 Radiculopathy, lumbar region: Secondary | ICD-10-CM | POA: Insufficient documentation

## 2015-04-07 DIAGNOSIS — Z7409 Other reduced mobility: Secondary | ICD-10-CM

## 2015-04-07 DIAGNOSIS — R2681 Unsteadiness on feet: Secondary | ICD-10-CM | POA: Diagnosis present

## 2015-04-07 DIAGNOSIS — R262 Difficulty in walking, not elsewhere classified: Secondary | ICD-10-CM | POA: Insufficient documentation

## 2015-04-07 DIAGNOSIS — R29898 Other symptoms and signs involving the musculoskeletal system: Secondary | ICD-10-CM | POA: Insufficient documentation

## 2015-04-07 DIAGNOSIS — M79604 Pain in right leg: Secondary | ICD-10-CM

## 2015-04-07 NOTE — Therapy (Signed)
[place], [place], [address] Phone: [phone]   Fax:  [phone]  Physical Therapy Treatment  Patient Details  Name: [name] MRN: [id number] Date of Birth: [birth year] Referring Provider: Dr. Clydell Hakim  Encounter Date: 04/07/2015      PT End of Session - 04/07/15 1358    Visit Number 2   Number of Visits 22   Date for PT Re-Evaluation 04/30/15   Authorization Type [employer] Medicare   Authorization Time Period 03/31/2015 to 05/19/2015    PT Start Time 1350   PT Stop Time 1430   PT Time Calculation (min) 40 min   Equipment Utilized During Treatment Gait belt   Activity Tolerance Patient tolerated treatment well   Behavior During Therapy [facility] for tasks assessed/performed      Past Medical History  Diagnosis Date  . Hypertension   . Stroke [facility])     Left side weakness.  . Shortness of breath     with activity  . Sleep apnea     does not use CPAP.  Tested maybe 5- 10 years ago  . GERD (gastroesophageal reflux disease)   . Constipation   . Glaucoma   . Abnormality of gait 08/01/2013  . Blindness of left eye     Posttraumatic  . Arthritis   . Radiculopathy   . ED (erectile dysfunction)   . Gastroparesis   . Unilateral inguinal hernia   . Osteoarthritis   . Carotid artery disease ([place])   . Disc disease, degenerative, cervical   . Overactive bladder   . Heart disease   . Insomnia   . Hyperlipidemia   . Diverticulitis   . Colon polyp   . Diaphragmatic hernia   . Nicotine dependence   . Memory difficulty 08/04/2014  . Numbness and tingling of right arm 08/04/2014    Past Surgical History  Procedure Laterality Date  . Hernia repair Right     inguinal  . Cervical fusion    . Anterior cervical decomp/discectomy fusion N/A 07/01/2012    Procedure: ANTERIOR CERVICAL DECOMPRESSION/DISCECTOMY FUSION CERVICAL FIVE-SIX [name] REMOVAL CERVICAL SIX-SEVEN;  Surgeon: [name], MD;  Location: [place] NEURO  ORS;  Service: Neurosurgery;  Laterality: N/A;  . Esophagogastroduodenoscopy  04/14/2002    [id number] ring, status post dilation as described above/Hiatal hernia, focal antral erosions of uncertain clinical significance  . Colonoscopy   08/04/2002      RMR: Normal rectum/Pancolonic diverticula/Colonic polyps as described above, biopsied and/or snared  . Esophagogastroduodenoscopy  06/09/2009    RMR: normal esophagus s/p dilator/small hiatal hernia otherwise normal  . Colonoscopy  60/08/2009    RMR: normal rectum/left and right sided diverticula/multiple colonic polyps. tubular adenomas. surveillance due 2014  . Colonoscopy with esophagogastroduodenoscopy (egd) N/A 12/18/2012    Dr. [name]:  Colonic diverticulosis. Multiple colonic polyps-hyperplastic polyps and tubular adenoma. Friable anal canal hemorrhoids. EGD with chronic atrophic gastritis  . Inguinal hernia repair Left 04/15/2014    Procedure: LEFT INGUINAL HERNIORRHAPHY WITH MESH;  Surgeon: [name], MD;  [facility];  Service: General;  Laterality: Left;  . Insertion of mesh Left 04/15/2014    Procedure: INSERTION OF MESH;  Surgeon: [name], MD;  Location: [facility];  Service: General;  Laterality: Left;    There were no vitals filed for this visit.  Visit Diagnosis:  Radiculopathy, lumbar region  Pain of right leg  Weakness of both legs  Difficulty in walking, not elsewhere classified  Decreased independence with transfers  Unsteadiness on feet      Subjective Assessment - 04/07/15 1354    Subjective Patient reported that he had an additional injection administered to his R gluteal/back region this past Monday with no significant changes assessed. However, pt noted that his MD instructed him that it would take ~ 1 week for his injection to be fully effective. Pt reported good compliance and tolerance with his HEP with the assistance from his spouse. He denied pain upon arrival while in a seated position,  but noted that his R groin and LE pain quickly increases when attempting to stand or transfer.   Patient is accompained by: Family member   Pertinent History HTN, CVA (2002), cervical fusion x 2, CAD, and blind in L eye    Limitations Walking;Standing   How long can you sit comfortably? Unlimited    How long can you stand comfortably? Few seconds    How long can you walk comfortably? Non-ambulatory    Diagnostic tests MRI= Indicated "pinched nerves"    Patient Stated Goals Patient's goal is to reduce his R LE pain and be able to stand/walk again if possible.    Currently in Pain? No/denies   Pain Score 0-No pain  R groin pain ranged between a 0-7/10 on a VAS during today's PT tx    Pain Orientation Right   Pain Descriptors / Indicators Burning   Pain Type Chronic pain   Pain Radiating Towards into the R LE and into the R foot    Pain Onset More than a month ago   Pain Frequency Intermittent   Aggravating Factors  lying in supine and standing    Pain Relieving Factors pain meds and sitting    Effect of Pain on Daily Activities Limiting his ability to complete standing activities, ambulate, transfer, and roll in bed                          [facility] Adult PT Treatment/Exercise - 04/07/15 0001    Exercises   Exercises Lumbar;Knee/Hip   Lumbar Exercises: Stretches   Single Knee to Chest Stretch Limitations 1 set of 15 reps with manual assist    Lower Trunk Rotation Limitations 1 set of 10 reps with manual assist    Piriformis Stretch 30 seconds;3 reps   Piriformis Stretch Limitations in seated position, bilateral    Lumbar Exercises: Seated   Other Seated Lumbar Exercises Seated lumbar flexion with use of physioball x 1 set of 15 reps with 3 sec hold   manual assist provided for proper technique    Other Seated Lumbar Exercises Seated core bracing x 1 set of 15 reps with 5 sec hold   verbal and tactile cues required for proper core activation   Knee/Hip Exercises: Seated    Abduction/Adduction  Strengthening;2 sets;Both;10 reps;Other (comment)  each ther ex    Abd/Adduction Limitations Abduction= red thera-band;  Adduction= yellow ball squeeze with 3 sec hold    Sit to Sand 2 sets;Other (comment)  with min to mod assist x 1; limited by R groin pain    Manual Therapy   Manual Therapy Joint mobilization;Passive ROM   Manual therapy comments Completed separately from ther ex interventions    Joint Mobilization R hip long axis distraction in supine, 4 sets of 1 minute   Passive ROM B hip PROM into flex/IR/ER x 15 reps in supine  PT Education - 04/07/15 1614    Education provided Yes   Education Details Current HEP, pain management strategies, and transfer technique with stan pivot transfer from/to W/C    Person(s) Educated Patient   Methods Explanation;Demonstration;Verbal cues;Tactile cues   Comprehension Verbalized understanding;Returned demonstration;Need further instruction          PT Short Term Goals - 03/31/15 1951    PT SHORT TERM GOAL #1   Title Patient/spouse will independently verbalize and demo proper completion of initial HEP to continue with core/LE strengthening and LE stretches.   Time 2   Period Weeks   Status New   PT SHORT TERM GOAL #2   Title Patient will independently verbalize 5/5 fall precautions in order to reduce the risk for falls with functional mobility activities.    Time 2   Period Weeks   Status New   PT SHORT TERM GOAL #3   Title Patient will report decreased max R LE radicular pain to 5/10 on a VAS in order to improve quality of life and tolerance with lying in supine.    Time 3   Period Weeks   Status New   PT SHORT TERM GOAL #4   Title Patient will complete sit<>stand transfer with CGA x1 with less difficulty assessed in order to imrpove safety and independence with transfers.    Time 4   Period Weeks   Status New           PT Long Term Goals - 03/31/15 1950    PT LONG TERM  GOAL #1   Title  Patient/spouse will independently verbalize and demo proper completion of advanced HEP to continue with core/LE strengthening and LE stretches once DC from PT.    Time 7   Period Weeks   Status New   PT LONG TERM GOAL #2   Title Patient will report decreased R LE radicular pain to 0-3/10 with less exacerbations in order to improve ability to complete transfers with less pain and limitations.    Time 7   Period Weeks   Status New   PT LONG TERM GOAL #3   Title Patient will improve B hip and core strength by 1 MMT grade in order to imrpove performance with transfers.    Time 7   Period Weeks   Status New   PT LONG TERM GOAL #4   Title Patient will improve his FOTO limitation score to <40% in order to progress towards his PLOF.   Time 7   Period Weeks   Status New   PT LONG TERM GOAL #5   Title Patient will be able to statically stand for 30 seconds with B UE support on FWW in order to improve balance with ADLs.    Time 7   Period Weeks   Status New               Plan - 04/07/15 1359    Clinical Impression Statement PT tx focused on seated hip strengthening ther ex, lumbar flexion AAROM based ther ex in supine/sitting, core stabilization in sitting, and R LE distraction/PROM. Fair to good tolerance reported with completed ther ex and manual techniques. Pt c/o increased R groin pain to a 7/10 on a VAS upon standing, which subsided once LE were placed in hooklying position. Completed R hip distraction with decreased R groin pain to a 0/10 on a VAS. Pt was limited with supine ther ex secondary to R groin pain; therefore, majority of his ther  ex were completed in a seated position. Pt was able to tolerate all seated ther ex with R groin pain rated a 0/10 on a VAS. Reviewed current HEP with patient and spouse with good understanding verbalized by spouse.  Pt responded well to PT tx and would benefit from seated ther ex in order to improve tolerance with ther ex. Continue  with current POC.    Pt will benefit from skilled therapeutic intervention in order to improve on the following deficits Difficulty walking;Postural dysfunction;Decreased activity tolerance;Decreased mobility;Decreased balance;Decreased range of motion;Pain;Decreased coordination   Rehab Potential Fair   Clinical Impairments Affecting Rehab Potential B LE weakness and non-ambulatory    PT Frequency 3x / week   PT Duration Other (comment)  7 weeks    PT Treatment/Interventions ADLs/Self Care Home Management;Cryotherapy;Electrical Stimulation;Moist Heat;Gait training;DME Instruction;Therapeutic activities;Therapeutic exercise;Balance training;Neuromuscular re-education;Patient/family education;Taping;Passive range of motion;Manual techniques   PT Next Visit Plan Next visit to focus on seated core stabilization, seated lumbar flexion, seated hip abduction strengthening, R LE distraction, and SKTC/DKTC.    PT Home Exercise Plan  Reviewed HEP with spouse and patient with good understanding verbalized by patient only   Consulted and Agree with Plan of Care Patient;Family member/caregiver   Family Member Consulted Spouse         Problem List Patient Active Problem List   Diagnosis Date Noted  . Memory difficulty 08/04/2014  . Numbness and tingling of right arm 08/04/2014  . Abnormality of gait 08/01/2013  . Anemia 12/05/2012  . Early satiety 12/03/2012  . History of colon polyps 12/03/2012  . Facial droop 10/12/2012  . Spinal stenosis of cervical region 07/01/2012  . GERD 05/27/2009  . DYSPHAGIA UNSPECIFIED 05/27/2009  . CHANGE IN BOWELS 05/27/2009  . SMOKER 09/09/2008  . HYPERTENSION 09/09/2008  . CVA 09/09/2008  . CONSTIPATION 09/09/2008  . HIGH BLOOD PRESSURE 07/25/2006    Garen Lah, PT, DPT   04/07/2015, 4:24 PM  [place] [address] [place], [place], [address] Phone: [phone]   Fax:  [phone]  Name: [name] MRN:  [id number] Date of Birth: July 09, 1934

## 2015-04-09 ENCOUNTER — Ambulatory Visit (HOSPITAL_COMMUNITY): Payer: Medicare Other

## 2015-04-09 DIAGNOSIS — M79604 Pain in right leg: Secondary | ICD-10-CM

## 2015-04-09 DIAGNOSIS — M5416 Radiculopathy, lumbar region: Secondary | ICD-10-CM | POA: Diagnosis not present

## 2015-04-09 DIAGNOSIS — R2681 Unsteadiness on feet: Secondary | ICD-10-CM

## 2015-04-09 DIAGNOSIS — M6281 Muscle weakness (generalized): Secondary | ICD-10-CM

## 2015-04-09 DIAGNOSIS — R262 Difficulty in walking, not elsewhere classified: Secondary | ICD-10-CM

## 2015-04-09 NOTE — Therapy (Signed)
[place], [place], [address] Phone: [phone]   Fax:  [phone]  Physical Therapy Treatment  Patient Details  Name: [name] MRN: [id number] Date of Birth: 1934/05/05 Referring Provider: Dr. Clydell Hakim  Encounter Date: 04/09/2015      PT End of Session - 04/09/15 1643    Visit Number 3   Number of Visits 22   Date for PT Re-Evaluation 04/30/15   Authorization Type [employer]HC Medicare   Authorization Time Period 03/31/2015 to 05/19/2015    PT Start Time 1603   PT Stop Time 1643   PT Time Calculation (min) 40 min   Equipment Utilized During Treatment Gait belt   Activity Tolerance Patient tolerated treatment well   Behavior During Therapy [facility] for tasks assessed/performed      Past Medical History  Diagnosis Date  . Hypertension   . Stroke [facility])     Left side weakness.  . Shortness of breath     with activity  . Sleep apnea     does not use CPAP.  Tested maybe 5- 10 years ago  . GERD (gastroesophageal reflux disease)   . Constipation   . Glaucoma   . Abnormality of gait 08/01/2013  . Blindness of left eye     Posttraumatic  . Arthritis   . Radiculopathy   . ED (erectile dysfunction)   . Gastroparesis   . Unilateral inguinal hernia   . Osteoarthritis   . Carotid artery disease ([employer])   . Disc disease, degenerative, cervical   . Overactive bladder   . Heart disease   . Insomnia   . Hyperlipidemia   . Diverticulitis   . Colon polyp   . Diaphragmatic hernia   . Nicotine dependence   . Memory difficulty 08/04/2014  . Numbness and tingling of right arm 08/04/2014    Past Surgical History  Procedure Laterality Date  . Hernia repair Right     inguinal  . Cervical fusion    . Anterior cervical decomp/discectomy fusion N/A 07/01/2012    Procedure: ANTERIOR CERVICAL DECOMPRESSION/DISCECTOMY FUSION CERVICAL FIVE-SIX [name] REMOVAL CERVICAL SIX-SEVEN;  Surgeon: [name], MD;  Location: [place] NEURO  ORS;  Service: Neurosurgery;  Laterality: N/A;  . Esophagogastroduodenoscopy  04/14/2002    [id number] ring, status post dilation as described above/Hiatal hernia, focal antral erosions of uncertain clinical significance  . Colonoscopy   08/04/2002      RMR: Normal rectum/Pancolonic diverticula/Colonic polyps as described above, biopsied and/or snared  . Esophagogastroduodenoscopy  06/09/2009    RMR: normal esophagus s/p dilator/small hiatal hernia otherwise normal  . Colonoscopy  60/08/2009    RMR: normal rectum/left and right sided diverticula/multiple colonic polyps. tubular adenomas. surveillance due 2014  . Colonoscopy with esophagogastroduodenoscopy (egd) N/A 12/18/2012    Dr. [name]:  Colonic diverticulosis. Multiple colonic polyps-hyperplastic polyps and tubular adenoma. Friable anal canal hemorrhoids. EGD with chronic atrophic gastritis  . Inguinal hernia repair Left 04/15/2014    Procedure: LEFT INGUINAL HERNIORRHAPHY WITH MESH;  Surgeon: [name], MD;  Location: [facility];  Service: General;  Laterality: Left;  . Insertion of mesh Left 04/15/2014    Procedure: INSERTION OF MESH;  Surgeon: [name], MD;  Location: [facility];  Service: General;  Laterality: Left;    There were no vitals filed for this visit.      Subjective Assessment - 04/09/15 1601    Subjective Pt reports pain free in seated position, pain increased to 7/10  with standing or supine position   Pertinent History HTN, CVA (2002), cervical fusion x 2, CAD, and blind in L eye    Patient Stated Goals Patient's goal is to reduce his R LE pain and be able to stand/walk again if possible.    Currently in Pain? No/denies   Pain Score 0-No pain  0-7/10 with supine or standing   Pain Location Groin   Pain Orientation Right             [facility] Adult PT Treatment/Exercise - 04/09/15 0001    Lumbar Exercises: Stretches   Single Knee to Chest Stretch Limitations 1 set of 15 reps with manual assist     Piriformis Stretch 30 seconds;3 reps   Piriformis Stretch Limitations in seated position, bilateral    Lumbar Exercises: Seated   Hip Flexion on Ball Both;10 reps   Hip Flexion on Ball Limitations on mat marching and hold 5" while sitting tall and no UE A for core activation   Other Seated Lumbar Exercises Seated lumbar flexion with use of physioball x 1 set of 15 reps with 3 sec hold    Other Seated Lumbar Exercises Seated core bracing x 1 set of 15 reps with 5 sec hold    Knee/Hip Exercises: Seated   Abduction/Adduction  Strengthening;2 sets;Both;10 reps;Other (comment)   Abd/Adduction Limitations Abduction= red thera-band; Adduction= yellow ball squeeze with 3 sec hold    Manual Therapy   Manual Therapy Joint mobilization;Passive ROM   Manual therapy comments Completed separately from ther ex interventions    Joint Mobilization R hip long axis distraction in supine, 4 sets of 1 minute   Passive ROM B hip PROM into flex/IR/ER x 15 reps in supine              PT Short Term Goals - 03/31/15 1951    PT SHORT TERM GOAL #1   Title Patient/spouse will independently verbalize and demo proper completion of initial HEP to continue with core/LE strengthening and LE stretches.   Time 2   Period Weeks   Status New   PT SHORT TERM GOAL #2   Title Patient will independently verbalize 5/5 fall precautions in order to reduce the risk for falls with functional mobility activities.    Time 2   Period Weeks   Status New   PT SHORT TERM GOAL #3   Title Patient will report decreased max R LE radicular pain to 5/10 on a VAS in order to improve quality of life and tolerance with lying in supine.    Time 3   Period Weeks   Status New   PT SHORT TERM GOAL #4   Title Patient will complete sit<>stand transfer with CGA x1 with less difficulty assessed in order to imrpove safety and independence with transfers.    Time 4   Period Weeks   Status New           PT Long Term Goals - 03/31/15 1950     PT LONG TERM GOAL #1   Title  Patient/spouse will independently verbalize and demo proper completion of advanced HEP to continue with core/LE strengthening and LE stretches once DC from PT.    Time 7   Period Weeks   Status New   PT LONG TERM GOAL #2   Title Patient will report decreased R LE radicular pain to 0-3/10 with less exacerbations in order to improve ability to complete transfers with less pain and limitations.    Time  7   Period Weeks   Status New   PT LONG TERM GOAL #3   Title Patient will improve B hip and core strength by 1 MMT grade in order to imrpove performance with transfers.    Time 7   Period Weeks   Status New   PT LONG TERM GOAL #4   Title Patient will improve his FOTO limitation score to <40% in order to progress towards his PLOF.   Time 7   Period Weeks   Status New   PT LONG TERM GOAL #5   Title Patient will be able to statically stand for 30 seconds with B UE support on FWW in order to improve balance with ADLs.    Time 7   Period Weeks   Status New               Plan - 04/09/15 1655    Clinical Impression Statement Session focus on manual stretches to improve LE mobility and core stabiliization exercises.  Pt c/o increased Rt goin pain scale to 7/10 with standing while transitioning to supine position, pain supsided once LE were in hooklying positoin.  Pt reports pain relief with Rt hip distraction with pain scale reduced to 0/10.  Pt with good tolerance with core stabilization exercises in seated position with no reports of pain.  Pt educated on importance of proper posture, added hip marching exercises with cueing for core activaiton for strengthening, min cueing required to reduce forward rolled shoulders wtih new exercise.  No reports of pain through session.     Rehab Potential Fair   Clinical Impairments Affecting Rehab Potential B LE weakness and non-ambulatory    PT Frequency 3x / week   PT Duration --  7 weeks   PT  Treatment/Interventions ADLs/Self Care Home Management;Cryotherapy;Electrical Stimulation;Moist Heat;Gait training;DME Instruction;Therapeutic activities;Therapeutic exercise;Balance training;Neuromuscular re-education;Patient/family education;Taping;Passive range of motion;Manual techniques   PT Next Visit Plan Next visit to focus on seated core stabilization, seated lumbar flexion, seated hip abduction strengthening, R LE distraction, and SKTC/DKTC.  Progress transfers and  standing as able      Patient will benefit from skilled therapeutic intervention in order to improve the following deficits and impairments:  Difficulty walking, Postural dysfunction, Decreased activity tolerance, Decreased mobility, Decreased balance, Decreased range of motion, Pain, Decreased coordination  Visit Diagnosis: Radiculopathy, lumbar region  Pain of right leg  Muscle weakness (generalized)  Difficulty in walking, not elsewhere classified  Unsteadiness on feet     Problem List Patient Active Problem List   Diagnosis Date Noted  . Memory difficulty 08/04/2014  . Numbness and tingling of right arm 08/04/2014  . Abnormality of gait 08/01/2013  . Anemia 12/05/2012  . Early satiety 12/03/2012  . History of colon polyps 12/03/2012  . Facial droop 10/12/2012  . Spinal stenosis of cervical region 07/01/2012  . GERD 05/27/2009  . DYSPHAGIA UNSPECIFIED 05/27/2009  . CHANGE IN BOWELS 05/27/2009  . SMOKER 09/09/2008  . HYPERTENSION 09/09/2008  . CVA 09/09/2008  . CONSTIPATION 09/09/2008  . HIGH BLOOD PRESSURE 07/25/2006   Ihor Austin, LPTA; [place]  Aldona Lento 04/09/2015, 5:47 PM   Garen Lah, PT, DPT  [place] [address] [place], [place], [address] Phone: [phone]   Fax:  [phone]  Name: [name] MRN: [id number] Date of Birth: [birth year]

## 2015-04-12 ENCOUNTER — Encounter (HOSPITAL_COMMUNITY): Payer: Medicare Other

## 2015-04-16 ENCOUNTER — Ambulatory Visit (HOSPITAL_COMMUNITY): Payer: Medicare Other | Admitting: Physical Therapy

## 2015-04-16 DIAGNOSIS — M79604 Pain in right leg: Secondary | ICD-10-CM

## 2015-04-16 DIAGNOSIS — M5416 Radiculopathy, lumbar region: Secondary | ICD-10-CM | POA: Diagnosis not present

## 2015-04-16 DIAGNOSIS — M6281 Muscle weakness (generalized): Secondary | ICD-10-CM

## 2015-04-16 DIAGNOSIS — R262 Difficulty in walking, not elsewhere classified: Secondary | ICD-10-CM

## 2015-04-16 NOTE — Therapy (Signed)
[place], [place], [address] Phone: [phone]   Fax:  [phone]  Physical Therapy Treatment  Patient Details  Name: [name] MRN: [id number] Date of Birth: 1934-09-20 Referring Provider: Dr. Clydell Hakim  Encounter Date: 04/16/2015      PT End of Session - 04/16/15 1158    Visit Number 4   Number of Visits 22   Date for PT Re-Evaluation 04/30/15   Authorization Type [employer] Medicare   Authorization Time Period 03/31/2015 to 05/19/2015    PT Start Time 1120   PT Stop Time 1158   PT Time Calculation (min) 38 min   Equipment Utilized During Treatment Gait belt   Activity Tolerance Patient tolerated treatment well   Behavior During Therapy [facility] for tasks assessed/performed      Past Medical History  Diagnosis Date  . Hypertension   . Stroke [facility])     Left side weakness.  . Shortness of breath     with activity  . Sleep apnea     does not use CPAP.  Tested maybe 5- 10 years ago  . GERD (gastroesophageal reflux disease)   . Constipation   . Glaucoma   . Abnormality of gait 08/01/2013  . Blindness of left eye     Posttraumatic  . Arthritis   . Radiculopathy   . ED (erectile dysfunction)   . Gastroparesis   . Unilateral inguinal hernia   . Osteoarthritis   . Carotid artery disease ([place])   . Disc disease, degenerative, cervical   . Overactive bladder   . Heart disease   . Insomnia   . Hyperlipidemia   . Diverticulitis   . Colon polyp   . Diaphragmatic hernia   . Nicotine dependence   . Memory difficulty 08/04/2014  . Numbness and tingling of right arm 08/04/2014    Past Surgical History  Procedure Laterality Date  . Hernia repair Right     inguinal  . Cervical fusion    . Anterior cervical decomp/discectomy fusion N/A 07/01/2012    Procedure: ANTERIOR CERVICAL DECOMPRESSION/DISCECTOMY FUSION CERVICAL FIVE-SIX [name] REMOVAL CERVICAL SIX-SEVEN;  Surgeon: [name], MD;  Location: [place] [facility]  [facility];  Service: Neurosurgery;  Laterality: N/A;  . Esophagogastroduodenoscopy  04/14/2002    [id number] ring, status post dilation as described above/Hiatal hernia, focal antral erosions of uncertain clinical significance  . Colonoscopy   08/04/2002      RMR: Normal rectum/Pancolonic diverticula/Colonic polyps as described above, biopsied and/or snared  . Esophagogastroduodenoscopy  06/09/2009    RMR: normal esophagus s/p dilator/small hiatal hernia otherwise normal  . Colonoscopy  60/08/2009    RMR: normal rectum/left and right sided diverticula/multiple colonic polyps. tubular adenomas. surveillance due 2014  . Colonoscopy with esophagogastroduodenoscopy (egd) N/A 12/18/2012    Dr. [name]:  Colonic diverticulosis. Multiple colonic polyps-hyperplastic polyps and tubular adenoma. Friable anal canal hemorrhoids. EGD with chronic atrophic gastritis  . Inguinal hernia repair Left 04/15/2014    Procedure: LEFT INGUINAL HERNIORRHAPHY WITH MESH;  Surgeon: [name], MD;  Location: [facility];  Service: General;  Laterality: Left;  . Insertion of mesh Left 04/15/2014    Procedure: INSERTION OF MESH;  Surgeon: [name], MD;  Location: [facility];  Service: General;  Laterality: Left;    There were no vitals filed for this visit.      Subjective Assessment - 04/16/15 1120    Subjective Pt notes he hasn't had much change since his last visit.  He has had 2 injections and didn't notice much improvement. He has noticed both ankles have been swelling by the end of the day, but it is gone by the next morning.    Patient is accompained by: Family member   Pertinent History HTN, CVA (2002), cervical fusion x 2, CAD, and blind in L eye    Currently in Pain? No/denies  notes R groin pain during therex 8/10                         [facility] Adult PT Treatment/Exercise - 04/16/15 0001    Transfers   Stand Pivot Transfers 5: Supervision   Lumbar Exercises: Supine   Ab Set 10 reps, hold  5 sec   AB Set Limitations Cuing for correct technique   Clam 10 reps   Clam Limitations red TB   Bridge 10 reps   Other Supine Lumbar Exercises isometric abduction with towel squeeze x10 reps   Other Supine Lumbar Exercises Bent knee rotation L/R x20 reps   Manual Therapy   Manual Therapy Joint mobilization;Passive ROM   Manual therapy comments Completed separately from ther ex interventions    Joint Mobilization R hip long axis distraction in supine, 4 sets of 1 minute   Passive ROM B hip PROM into flex/IR/ER x 15 reps in supine                 PT Education - 04/16/15 1158    Education provided Yes   Education Details reviewed and updated HEP; discussed compliance with HEP for improved strength, etc.; encouraged pt to contact PCP regarding R groin pain.   Person(s) Educated Patient;Spouse   Methods Explanation;Demonstration;Handout   Comprehension Verbalized understanding;Returned demonstration;Need further instruction          PT Short Term Goals - 03/31/15 1951    PT SHORT TERM GOAL #1   Title Patient/spouse will independently verbalize and demo proper completion of initial HEP to continue with core/LE strengthening and LE stretches.   Time 2   Period Weeks   Status New   PT SHORT TERM GOAL #2   Title Patient will independently verbalize 5/5 fall precautions in order to reduce the risk for falls with functional mobility activities.    Time 2   Period Weeks   Status New   PT SHORT TERM GOAL #3   Title Patient will report decreased max R LE radicular pain to 5/10 on a VAS in order to improve quality of life and tolerance with lying in supine.    Time 3   Period Weeks   Status New   PT SHORT TERM GOAL #4   Title Patient will complete sit<>stand transfer with CGA x1 with less difficulty assessed in order to imrpove safety and independence with transfers.    Time 4   Period Weeks   Status New           PT Long Term Goals - 03/31/15 1950    PT LONG TERM  GOAL #1   Title  Patient/spouse will independently verbalize and demo proper completion of advanced HEP to continue with core/LE strengthening and LE stretches once DC from PT.    Time 7   Period Weeks   Status New   PT LONG TERM GOAL #2   Title Patient will report decreased R LE radicular pain to 0-3/10 with less exacerbations in order to improve ability to complete transfers with less pain and limitations.  Time 7   Period Weeks   Status New   PT LONG TERM GOAL #3   Title Patient will improve B hip and core strength by 1 MMT grade in order to imrpove performance with transfers.    Time 7   Period Weeks   Status New   PT LONG TERM GOAL #4   Title Patient will improve his FOTO limitation score to <40% in order to progress towards his PLOF.   Time 7   Period Weeks   Status New   PT LONG TERM GOAL #5   Title Patient will be able to statically stand for 30 seconds with B UE support on FWW in order to improve balance with ADLs.    Time 7   Period Weeks   Status New               Plan - 04/16/15 1200    Clinical Impression Statement Session focused on manual stretching and LE therex to improve strength and flexibility. Pt continues to c/o Rt groin pain rating it 8/10 on VAS with all activity during today's session. Pain was relieved with Rt hip distraction to 0/10. Otherwise pt with increased difficulty sequencing transverse abd activation during abdominal bracing exercise and requiring cuing for proper technique. Updated HEP and reviewed with pt and his wife with reported good understanding at the end of today's session.   Rehab Potential Fair   Clinical Impairments Affecting Rehab Potential B LE weakness and non-ambulatory    PT Frequency 3x / week   PT Duration --  7 weeks   PT Treatment/Interventions ADLs/Self Care Home Management;Cryotherapy;Electrical Stimulation;Moist Heat;Gait training;DME Instruction;Therapeutic activities;Therapeutic exercise;Balance  training;Neuromuscular re-education;Patient/family education;Taping;Passive range of motion;Manual techniques   PT Next Visit Plan Next visit to focus on seated core stabilization, seated lumbar flexion, seated hip abduction strengthening, R LE distraction, and SKTC/DKTC.  Progress transfers and  standing as able   PT Home Exercise Plan  Reviewed HEP with spouse and patient with good understanding verbalized by both   Consulted and Agree with Plan of Care Patient;Family member/caregiver   Family Member Consulted spouse      Patient will benefit from skilled therapeutic intervention in order to improve the following deficits and impairments:  Difficulty walking, Postural dysfunction, Decreased activity tolerance, Decreased mobility, Decreased balance, Decreased range of motion, Pain, Decreased coordination  Visit Diagnosis: Radiculopathy, lumbar region  Pain of right leg  Muscle weakness (generalized)  Difficulty in walking, not elsewhere classified     Problem List Patient Active Problem List   Diagnosis Date Noted  . Memory difficulty 08/04/2014  . Numbness and tingling of right arm 08/04/2014  . Abnormality of gait 08/01/2013  . Anemia 12/05/2012  . Early satiety 12/03/2012  . History of colon polyps 12/03/2012  . Facial droop 10/12/2012  . Spinal stenosis of cervical region 07/01/2012  . GERD 05/27/2009  . DYSPHAGIA UNSPECIFIED 05/27/2009  . CHANGE IN BOWELS 05/27/2009  . SMOKER 09/09/2008  . HYPERTENSION 09/09/2008  . CVA 09/09/2008  . CONSTIPATION 09/09/2008  . HIGH BLOOD PRESSURE 07/25/2006    12:07 PM,04/16/2015 Elly Modena PT, DPT [facility] [place] [address] [place], [place], [address] Phone: [phone]   Fax:  [phone]  Name: [name] MRN: [id number] Date of Birth: Oct 21, 1934

## 2015-04-20 ENCOUNTER — Ambulatory Visit (HOSPITAL_COMMUNITY): Payer: Medicare Other

## 2015-04-20 DIAGNOSIS — M6281 Muscle weakness (generalized): Secondary | ICD-10-CM

## 2015-04-20 DIAGNOSIS — R2681 Unsteadiness on feet: Secondary | ICD-10-CM

## 2015-04-20 DIAGNOSIS — M5416 Radiculopathy, lumbar region: Secondary | ICD-10-CM | POA: Diagnosis not present

## 2015-04-20 DIAGNOSIS — R262 Difficulty in walking, not elsewhere classified: Secondary | ICD-10-CM

## 2015-04-20 DIAGNOSIS — M79604 Pain in right leg: Secondary | ICD-10-CM

## 2015-04-20 NOTE — Therapy (Signed)
[name], [place], [address] Phone: [phone]   Fax:  [phone]  Physical Therapy Treatment  Patient Details  Name: [name] MRN: [id number] Date of Birth: 04-16-1934 Referring Provider: Dr. Clydell Hakim  Encounter Date: 04/20/2015      PT End of Session - 04/20/15 1621    Visit Number 5   Number of Visits 22   Date for PT Re-Evaluation 04/30/15   Authorization Type [employer]HC Medicare   PT Start Time 1605   PT Stop Time 1644   PT Time Calculation (min) 39 min   Equipment Utilized During Treatment Gait belt   Activity Tolerance Patient tolerated treatment well;Patient limited by pain;No increased pain  No reports of pain at EOS   Behavior During Therapy [facility] for tasks assessed/performed      Past Medical History  Diagnosis Date  . Hypertension   . Stroke [facility])     Left side weakness.  . Shortness of breath     with activity  . Sleep apnea     does not use CPAP.  Tested maybe 5- 10 years ago  . GERD (gastroesophageal reflux disease)   . Constipation   . Glaucoma   . Abnormality of gait 08/01/2013  . Blindness of left eye     Posttraumatic  . Arthritis   . Radiculopathy   . ED (erectile dysfunction)   . Gastroparesis   . Unilateral inguinal hernia   . Osteoarthritis   . Carotid artery disease ([place])   . Disc disease, degenerative, cervical   . Overactive bladder   . Heart disease   . Insomnia   . Hyperlipidemia   . Diverticulitis   . Colon polyp   . Diaphragmatic hernia   . Nicotine dependence   . Memory difficulty 08/04/2014  . Numbness and tingling of right arm 08/04/2014    Past Surgical History  Procedure Laterality Date  . Hernia repair Right     inguinal  . Cervical fusion    . Anterior cervical decomp/discectomy fusion N/A 07/01/2012    Procedure: ANTERIOR CERVICAL DECOMPRESSION/DISCECTOMY FUSION CERVICAL FIVE-SIX [name] REMOVAL CERVICAL SIX-SEVEN;  Surgeon: [name], MD;   Location: [place] NEURO ORS;  Service: Neurosurgery;  Laterality: N/A;  . Esophagogastroduodenoscopy  04/14/2002    [id number] ring, status post dilation as described above/Hiatal hernia, focal antral erosions of uncertain clinical significance  . Colonoscopy   08/04/2002      RMR: Normal rectum/Pancolonic diverticula/Colonic polyps as described above, biopsied and/or snared  . Esophagogastroduodenoscopy  06/09/2009    RMR: normal esophagus s/p dilator/small hiatal hernia otherwise normal  . Colonoscopy  60/08/2009    RMR: normal rectum/left and right sided diverticula/multiple colonic polyps. tubular adenomas. surveillance due 2014  . Colonoscopy with esophagogastroduodenoscopy (egd) N/A 12/18/2012    Dr. [name]:  Colonic diverticulosis. Multiple colonic polyps-hyperplastic polyps and tubular adenoma. Friable anal canal hemorrhoids. EGD with chronic atrophic gastritis  . Inguinal hernia repair Left 04/15/2014    Procedure: LEFT INGUINAL HERNIORRHAPHY WITH MESH;  Surgeon: [name], MD;  Location: [facility];  Service: General;  Laterality: Left;  . Insertion of mesh Left 04/15/2014    Procedure: INSERTION OF MESH;  Surgeon: [name], MD;  Location: [facility];  Service: General;  Laterality: Left;    There were no vitals filed for this visit.      Subjective Assessment - 04/20/15 1559    Subjective Pt reports pain free at entrance into dept  today, stated he continues to have increased Rt groin pain with standing or sitting on other chairs (arrived in wheelchair), reports went to MD yesterday and plans to be referred to specialist following xray tommorrow.     Pertinent History HTN, CVA (2002), cervical fusion x 2, CAD, and blind in L eye    Patient Stated Goals Patient's goal is to reduce his R LE pain and be able to stand/walk again if possible.    Currently in Pain? No/denies  0 sitting in WC, 5-6/10 supine with LE bent and extended   Pain Score --  0-6/10 supine position   Pain  Location Groin   Pain Orientation Right   Pain Descriptors / Indicators Aching   Pain Radiating Towards into the R LE and into the R foot   Pain Onset More than a month ago   Pain Frequency Intermittent   Aggravating Factors  lying in supine and standing   Pain Relieving Factors pain meds and sitting   Effect of Pain on Daily Activities Limiting his ability to complete standing activities, ambulate, transfer, and roll in bed             [facility] Adult PT Treatment/Exercise - 04/20/15 0001    Bed Mobility   Bed Mobility Sit to Sidelying Left;Rolling Right;Rolling Left;Left Sidelying to Sit   Rolling Right 6: Modified independent (Device/Increase time)  cueing for technique   Rolling Left 6: Modified independent (Device/Increase time)  cueing for technique   Left Sidelying to Sit 6: Modified independent (Device/Increase time)  cueing for technique   Sit to Sidelying Left --  cueing for technique   Transfers   Stand Pivot Transfers 5: Supervision   Lumbar Exercises: Stretches   Passive Hamstring Stretch 3 reps;30 seconds  passive long sitting on therapist    Piriformis Stretch 3 reps;30 seconds   Piriformis Stretch Limitations seated   Lumbar Exercises: Seated   Other Seated Lumbar Exercises Seated lumbar flexion with use of physioball x 1 set of 15 reps with 3 sec hold ; abd with RTB 15; h/s curls 15x with RTB   Other Seated Lumbar Exercises Seated core bracing x 1 set of 15 reps with 5 sec hold    Lumbar Exercises: Supine   Ab Set 10 reps   AB Set Limitations unable    Glut Set 10 reps   Glut Set Limitations attempted bridges reports increased pain   Bridge 10 reps   Bridge Limitations attempted   Other Supine Lumbar Exercises isometric abduction with towel squeeze                  PT Short Term Goals - 03/31/15 1951    PT SHORT TERM GOAL #1   Title Patient/spouse will independently verbalize and demo proper completion of initial HEP to continue with core/LE  strengthening and LE stretches.   Time 2   Period Weeks   Status New   PT SHORT TERM GOAL #2   Title Patient will independently verbalize 5/5 fall precautions in order to reduce the risk for falls with functional mobility activities.    Time 2   Period Weeks   Status New   PT SHORT TERM GOAL #3   Title Patient will report decreased max R LE radicular pain to 5/10 on a VAS in order to improve quality of life and tolerance with lying in supine.    Time 3   Period Weeks   Status New   PT SHORT TERM  GOAL #4   Title Patient will complete sit<>stand transfer with CGA x1 with less difficulty assessed in order to imrpove safety and independence with transfers.    Time 4   Period Weeks   Status New           PT Long Term Goals - 03/31/15 1950    PT LONG TERM GOAL #1   Title  Patient/spouse will independently verbalize and demo proper completion of advanced HEP to continue with core/LE strengthening and LE stretches once DC from PT.    Time 7   Period Weeks   Status New   PT LONG TERM GOAL #2   Title Patient will report decreased R LE radicular pain to 0-3/10 with less exacerbations in order to improve ability to complete transfers with less pain and limitations.    Time 7   Period Weeks   Status New   PT LONG TERM GOAL #3   Title Patient will improve B hip and core strength by 1 MMT grade in order to imrpove performance with transfers.    Time 7   Period Weeks   Status New   PT LONG TERM GOAL #4   Title Patient will improve his FOTO limitation score to <40% in order to progress towards his PLOF.   Time 7   Period Weeks   Status New   PT LONG TERM GOAL #5   Title Patient will be able to statically stand for 30 seconds with B UE support on FWW in order to improve balance with ADLs.    Time 7   Period Weeks   Status New               Plan - 04/20/15 1901    Clinical Impression Statement Session focus on manual/active stretches for LE mobilty and therex for  strengthening.  Pt continues to c/o significant Rt groin pain increasd to 6/10 with position change in supine.  Pt educated on bed mobilty mechanics to reduce stress on lower back and hip and encouraged to begin at home.  Pt stated pain resolved to 0/10 with Rt hip distraction.  No change in supine position with knee bent vs. extension.  Pt stated he has plans for an xray tomorrow and stated MD may send him to specialist based upon findings.  Pt continues to demonstrate significant weakness of core and proximal weakness and reports of increased pain with exercises in supine position.  Therapist facilitation required multimodal cueing for proper musculature activation as technique wtih majority of exercises today.  Reports compliance with seated HEP exercises at home.  End of session pt stated pain free in seated position.     Rehab Potential Fair   Clinical Impairments Affecting Rehab Potential B LE weakness and non-ambulatory    PT Frequency 3x / week   PT Duration --  7 weeks   PT Treatment/Interventions ADLs/Self Care Home Management;Cryotherapy;Electrical Stimulation;Moist Heat;Gait training;DME Instruction;Therapeutic activities;Therapeutic exercise;Balance training;Neuromuscular re-education;Patient/family education;Taping;Passive range of motion;Manual techniques   PT Next Visit Plan Pt with MD apt tomorrow 04/21/2015 concerning Rt groin pain, f/u with changes as appropriate.  Continue current PT POC to improve core stabilization, seated lumbar flexion and proximal musculature strengthening.  Manual Rt LE distraction for pain relief and progress transfers and standings as able.  Pt with increased pain in supine position.     PT Home Exercise Plan  Reviewed HEP with spouse and patient with good understanding verbalized by both      Patient will  benefit from skilled therapeutic intervention in order to improve the following deficits and impairments:  Difficulty walking, Postural dysfunction,  Decreased activity tolerance, Decreased mobility, Decreased balance, Decreased range of motion, Pain, Decreased coordination  Visit Diagnosis: Radiculopathy, lumbar region  Pain of right leg  Muscle weakness (generalized)  Difficulty in walking, not elsewhere classified  Unsteadiness on feet     Problem List Patient Active Problem List   Diagnosis Date Noted  . Memory difficulty 08/04/2014  . Numbness and tingling of right arm 08/04/2014  . Abnormality of gait 08/01/2013  . Anemia 12/05/2012  . Early satiety 12/03/2012  . History of colon polyps 12/03/2012  . Facial droop 10/12/2012  . Spinal stenosis of cervical region 07/01/2012  . GERD 05/27/2009  . DYSPHAGIA UNSPECIFIED 05/27/2009  . CHANGE IN BOWELS 05/27/2009  . SMOKER 09/09/2008  . HYPERTENSION 09/09/2008  . CVA 09/09/2008  . CONSTIPATION 09/09/2008  . HIGH BLOOD PRESSURE 07/25/2006   [name], LPTA; CBIS [phone]  [name] 04/20/2015, 7:12 PM  [place] [address] [place], [place], [address] Phone: [phone]   Fax:  [phone]  Name: [name] MRN: [id number] Date of Birth: January 01, 1935

## 2015-04-21 ENCOUNTER — Ambulatory Visit (HOSPITAL_COMMUNITY)
Admission: RE | Admit: 2015-04-21 | Discharge: 2015-04-21 | Disposition: A | Discharge status: Home / Self Care | Payer: Medicare Other | Source: Ambulatory Visit | Attending: Family Medicine | Admitting: Family Medicine

## 2015-04-21 ENCOUNTER — Other Ambulatory Visit (HOSPITAL_COMMUNITY): Payer: Self-pay | Admitting: Family Medicine

## 2015-04-21 DIAGNOSIS — R1031 Right lower quadrant pain: Secondary | ICD-10-CM | POA: Diagnosis present

## 2015-04-23 ENCOUNTER — Ambulatory Visit (HOSPITAL_COMMUNITY): Payer: Medicare Other | Admitting: Physical Therapy

## 2015-04-23 DIAGNOSIS — M79604 Pain in right leg: Secondary | ICD-10-CM

## 2015-04-23 DIAGNOSIS — M5416 Radiculopathy, lumbar region: Secondary | ICD-10-CM

## 2015-04-23 DIAGNOSIS — R2681 Unsteadiness on feet: Secondary | ICD-10-CM

## 2015-04-23 DIAGNOSIS — M6281 Muscle weakness (generalized): Secondary | ICD-10-CM

## 2015-04-23 DIAGNOSIS — R262 Difficulty in walking, not elsewhere classified: Secondary | ICD-10-CM

## 2015-04-23 NOTE — Therapy (Signed)
[facility] [address] [place], [place], [address] Phone: [phone]   Fax:  [phone]  Physical Therapy Treatment  Patient Details  Name: [name] MRN: [id number] Date of Birth: 01/20/1934 Referring Provider: Dr. Clydell Hakim  Encounter Date: 04/23/2015      PT End of Session - 04/23/15 1859    Visit Number 6   Number of Visits 22   Date for PT Re-Evaluation 04/30/15   Authorization Type [employer] Medicare   Authorization Time Period 03/31/2015 to 05/19/2015    PT Start Time [id number]   PT Stop Time 1756   PT Time Calculation (min) 41 min   Equipment Utilized During Treatment Gait belt   Activity Tolerance Patient tolerated treatment well;Patient limited by pain  reports of pain with weight bearing   Behavior During Therapy [facility] for tasks assessed/performed      Past Medical History  Diagnosis Date  . Hypertension   . Stroke [facility])     Left side weakness.  . Shortness of breath     with activity  . Sleep apnea     does not use CPAP.  Tested maybe 5- 10 years ago  . GERD (gastroesophageal reflux disease)   . Constipation   . Glaucoma   . Abnormality of gait 08/01/2013  . Blindness of left eye     Posttraumatic  . Arthritis   . Radiculopathy   . ED (erectile dysfunction)   . Gastroparesis   . Unilateral inguinal hernia   . Osteoarthritis   . Carotid artery disease ([place])   . Disc disease, degenerative, cervical   . Overactive bladder   . Heart disease   . Insomnia   . Hyperlipidemia   . Diverticulitis   . Colon polyp   . Diaphragmatic hernia   . Nicotine dependence   . Memory difficulty 08/04/2014  . Numbness and tingling of right arm 08/04/2014    Past Surgical History  Procedure Laterality Date  . Hernia repair Right     inguinal  . Cervical fusion    . Anterior cervical decomp/discectomy fusion N/A 07/01/2012    Procedure: ANTERIOR CERVICAL DECOMPRESSION/DISCECTOMY FUSION CERVICAL FIVE-SIX [name] REMOVAL CERVICAL  SIX-SEVEN;  Surgeon: [name], MD;  Location: [place] [facility];  Service: Neurosurgery;  Laterality: N/A;  . Esophagogastroduodenoscopy  04/14/2002    [id number] ring, status post dilation as described above/Hiatal hernia, focal antral erosions of uncertain clinical significance  . Colonoscopy   08/04/2002      RMR: Normal rectum/Pancolonic diverticula/Colonic polyps as described above, biopsied and/or snared  . Esophagogastroduodenoscopy  06/09/2009    RMR: normal esophagus s/p dilator/small hiatal hernia otherwise normal  . Colonoscopy  60/08/2009    RMR: normal rectum/left and right sided diverticula/multiple colonic polyps. tubular adenomas. surveillance due 2014  . Colonoscopy with esophagogastroduodenoscopy (egd) N/A 12/18/2012    Dr. [name]:  Colonic diverticulosis. Multiple colonic polyps-hyperplastic polyps and tubular adenoma. Friable anal canal hemorrhoids. EGD with chronic atrophic gastritis  . Inguinal hernia repair Left 04/15/2014    Procedure: LEFT INGUINAL HERNIORRHAPHY WITH MESH;  Surgeon: [name], MD;  Location: [facility];  Service: General;  Laterality: Left;  . Insertion of mesh Left 04/15/2014    Procedure: INSERTION OF MESH;  Surgeon: [name], MD;  Location: [facility];  Service: General;  Laterality: Left;    There were no vitals filed for this visit.      Subjective Assessment - 04/23/15 1719    Subjective Pt  reports he went to his MD and got an xray, but is not sure of  the results yet. He is currently not in any pain. He has been doing his exercises regularly. Notes 8/10 pain during weight bearing activity.    Patient is accompained by: Family member   Pertinent History HTN, CVA (2002), cervical fusion x 2, CAD, and blind in L eye    Currently in Pain? No/denies                         [facility] Adult PT Treatment/Exercise - 04/23/15 0001    Lumbar Exercises: Stretches   Passive Hamstring Stretch 3 reps;30 seconds   Lumbar Exercises:  Seated   Other Seated Lumbar Exercises seated trunk rotation reaching across body for cones x5 reps each, intermittent UE support   Lumbar Exercises: Supine   Ab Set 15 reps   AB Set Limitations perfomred correctly ~50% of the time with max cues   Clam 10 reps   Clam Limitations green TB x2 sets   Knee/Hip Exercises: Seated   Sit to Sand 1 set;with UE support  MinA, verbal cues for    Manual Therapy   Manual Therapy Joint mobilization   Joint Mobilization Rt long axis distraction x57min                PT Education - 04/23/15 1858    Education provided Yes   Education Details correct technique with sit to stand; log roll during sit to/from supine transfer   Person(s) Educated Patient   Methods Explanation;Demonstration;Verbal cues   Comprehension Verbalized understanding;Need further instruction          PT Short Term Goals - 03/31/15 1951    PT SHORT TERM GOAL #1   Title Patient/spouse will independently verbalize and demo proper completion of initial HEP to continue with core/LE strengthening and LE stretches.   Time 2   Period Weeks   Status New   PT SHORT TERM GOAL #2   Title Patient will independently verbalize 5/5 fall precautions in order to reduce the risk for falls with functional mobility activities.    Time 2   Period Weeks   Status New   PT SHORT TERM GOAL #3   Title Patient will report decreased max R LE radicular pain to 5/10 on a VAS in order to improve quality of life and tolerance with lying in supine.    Time 3   Period Weeks   Status New   PT SHORT TERM GOAL #4   Title Patient will complete sit<>stand transfer with CGA x1 with less difficulty assessed in order to imrpove safety and independence with transfers.    Time 4   Period Weeks   Status New           PT Long Term Goals - 03/31/15 1950    PT LONG TERM GOAL #1   Title  Patient/spouse will independently verbalize and demo proper completion of advanced HEP to continue with core/LE  strengthening and LE stretches once DC from PT.    Time 7   Period Weeks   Status New   PT LONG TERM GOAL #2   Title Patient will report decreased R LE radicular pain to 0-3/10 with less exacerbations in order to improve ability to complete transfers with less pain and limitations.    Time 7   Period Weeks   Status New   PT LONG TERM GOAL #3   Title  Patient will improve B hip and core strength by 1 MMT grade in order to imrpove performance with transfers.    Time 7   Period Weeks   Status New   PT LONG TERM GOAL #4   Title Patient will improve his FOTO limitation score to <40% in order to progress towards his PLOF.   Time 7   Period Weeks   Status New   PT LONG TERM GOAL #5   Title Patient will be able to statically stand for 30 seconds with B UE support on FWW in order to improve balance with ADLs.    Time 7   Period Weeks   Status New               Plan - 04/23/15 1901    Clinical Impression Statement Today's session focused on LE strengthening, flexibility and mobility training to improve strength and pain. Pt was pain free upon arrival however he was limited with weight bearing activities secondary to his sharp groin pain.  Therapist discussed correct log roll technique with pt needing further review. Will continue with current POC.   Rehab Potential Fair   Clinical Impairments Affecting Rehab Potential B LE weakness and non-ambulatory    PT Frequency 3x / week   PT Duration --  7 weeks   PT Treatment/Interventions ADLs/Self Care Home Management;Cryotherapy;Electrical Stimulation;Moist Heat;Gait training;DME Instruction;Therapeutic activities;Therapeutic exercise;Balance training;Neuromuscular re-education;Patient/family education;Taping;Passive range of motion;Manual techniques   PT Next Visit Plan Pt to f/u with MD concerning groin pain on 04/30/15.  Continue current PT POC to improve core stabilization, seated lumbar flexion and proximal musculature strengthening.   Manual Rt LE distraction for pain relief and progress transfers and standings as able.     PT Home Exercise Plan  Reviewed HEP with spouse and patient with good understanding verbalized by both   Consulted and Agree with Plan of Care Patient;Family member/caregiver   Family Member Consulted spouse      Patient will benefit from skilled therapeutic intervention in order to improve the following deficits and impairments:  Difficulty walking, Postural dysfunction, Decreased activity tolerance, Decreased mobility, Decreased balance, Decreased range of motion, Pain, Decreased coordination  Visit Diagnosis: Radiculopathy, lumbar region  Pain of right leg  Muscle weakness (generalized)  Difficulty in walking, not elsewhere classified  Unsteadiness on feet     Problem List Patient Active Problem List   Diagnosis Date Noted  . Memory difficulty 08/04/2014  . Numbness and tingling of right arm 08/04/2014  . Abnormality of gait 08/01/2013  . Anemia 12/05/2012  . Early satiety 12/03/2012  . History of colon polyps 12/03/2012  . Facial droop 10/12/2012  . Spinal stenosis of cervical region 07/01/2012  . GERD 05/27/2009  . DYSPHAGIA UNSPECIFIED 05/27/2009  . CHANGE IN BOWELS 05/27/2009  . SMOKER 09/09/2008  . HYPERTENSION 09/09/2008  . CVA 09/09/2008  . CONSTIPATION 09/09/2008  . HIGH BLOOD PRESSURE 07/25/2006   8:17 PM,04/23/2015 Elly Modena PT, DPT [facility] [address] [place], [place], [address] Phone: [phone]   Fax:  [phone]  Name: [name] MRN: [id number] Date of Birth: 1934-12-27

## 2015-04-27 ENCOUNTER — Encounter (HOSPITAL_COMMUNITY): Payer: Medicare Other

## 2015-04-28 ENCOUNTER — Ambulatory Visit (HOSPITAL_COMMUNITY): Payer: Medicare Other | Admitting: Physical Therapy

## 2015-04-28 DIAGNOSIS — R262 Difficulty in walking, not elsewhere classified: Secondary | ICD-10-CM

## 2015-04-28 DIAGNOSIS — M6281 Muscle weakness (generalized): Secondary | ICD-10-CM

## 2015-04-28 DIAGNOSIS — M79604 Pain in right leg: Secondary | ICD-10-CM

## 2015-04-28 DIAGNOSIS — M5416 Radiculopathy, lumbar region: Secondary | ICD-10-CM

## 2015-04-28 NOTE — Therapy (Signed)
[facility] [place], [place], [address] Phone: [phone]   Fax:  [phone]  Physical Therapy Treatment  Patient Details  Name: [name] MRN: [id number] Date of Birth: 01-Sep-1934 Referring Provider: Dr. Clydell Hakim  Encounter Date: 04/28/2015      PT End of Session - 04/28/15 1707    Visit Number 7   Number of Visits 22   Date for PT Re-Evaluation 04/30/15   Authorization Type [employer] Medicare   Authorization Time Period 03/31/2015 to 05/19/2015    Authorization - Visit Number 7   Authorization - Number of Visits 10   PT Start Time 1612   PT Stop Time 1655   PT Time Calculation (min) 43 min   Equipment Utilized During Treatment Gait belt   Activity Tolerance Patient tolerated treatment well;Patient limited by pain  reports of pain with weight bearing   Behavior During Therapy [facility] for tasks assessed/performed      Past Medical History  Diagnosis Date  . Hypertension   . Stroke [facility])     Left side weakness.  . Shortness of breath     with activity  . Sleep apnea     does not use CPAP.  Tested maybe 5- 10 years ago  . GERD (gastroesophageal reflux disease)   . Constipation   . Glaucoma   . Abnormality of gait 08/01/2013  . Blindness of left eye     Posttraumatic  . Arthritis   . Radiculopathy   . ED (erectile dysfunction)   . Gastroparesis   . Unilateral inguinal hernia   . Osteoarthritis   . Carotid artery disease ([place])   . Disc disease, degenerative, cervical   . Overactive bladder   . Heart disease   . Insomnia   . Hyperlipidemia   . Diverticulitis   . Colon polyp   . Diaphragmatic hernia   . Nicotine dependence   . Memory difficulty 08/04/2014  . Numbness and tingling of right arm 08/04/2014    Past Surgical History  Procedure Laterality Date  . Hernia repair Right     inguinal  . Cervical fusion    . Anterior cervical decomp/discectomy fusion N/A 07/01/2012    Procedure: ANTERIOR CERVICAL  DECOMPRESSION/DISCECTOMY FUSION CERVICAL FIVE-SIX [name] REMOVAL CERVICAL SIX-SEVEN;  Surgeon: [name], MD;  Location: [place] NEURO ORS;  Service: Neurosurgery;  Laterality: N/A;  . Esophagogastroduodenoscopy  04/14/2002    [id number]' ring, status post dilation as described above/Hiatal hernia, focal antral erosions of uncertain clinical significance  . Colonoscopy   08/04/2002      RMR: Normal rectum/Pancolonic diverticula/Colonic polyps as described above, biopsied and/or snared  . Esophagogastroduodenoscopy  06/09/2009    RMR: normal esophagus s/p dilator/small hiatal hernia otherwise normal  . Colonoscopy  60/08/2009    RMR: normal rectum/left and right sided diverticula/multiple colonic polyps. tubular adenomas. surveillance due 2014  . Colonoscopy with esophagogastroduodenoscopy (egd) N/A 12/18/2012    Dr. [name]:  Colonic diverticulosis. Multiple colonic polyps-hyperplastic polyps and tubular adenoma. Friable anal canal hemorrhoids. EGD with chronic atrophic gastritis  . Inguinal hernia repair Left 04/15/2014    Procedure: LEFT INGUINAL HERNIORRHAPHY WITH MESH;  Surgeon: [name], MD;  Location: [facility];  Service: General;  Laterality: Left;  . Insertion of mesh Left 04/15/2014    Procedure: INSERTION OF MESH;  Surgeon: [name], MD;  Location: [facility];  Service: General;  Laterality: Left;    There were no vitals filed for this visit.  Subjective Assessment - 04/28/15 1618    Subjective PT states he has been hurting in his Rt pelvis.  States he had an Xray but still hasn't gotten any results yet as his MD is out of town.  STates his back is feeling much better without pain 8/10 pain in his pelvis region.    Currently in Pain? No/denies                         [facility] Adult PT Treatment/Exercise - 04/28/15 0001    Lumbar Exercises: Stretches   Passive Hamstring Stretch 3 reps;30 seconds   Piriformis Stretch 30 seconds;1 rep   Piriformis  Stretch Limitations seated AAROM to get positioned   Lumbar Exercises: Seated   Other Seated Lumbar Exercises seated UE lateral flexion and rotation with use of small physioball on large mat 10 reps each.   Lumbar Exercises: Supine   Clam 5 reps   Bridge 5 reps   Bridge Limitations painful at 5 reps and discontinued   Other Supine Lumbar Exercises isometric abduction with towel squeeze   Knee/Hip Exercises: Seated   Sit to Sand with UE support  5 reps   Manual Therapy   Manual Therapy Joint mobilization   Joint Mobilization Rt long axis distraction x55min                  PT Short Term Goals - 03/31/15 1951    PT SHORT TERM GOAL #1   Title Patient/spouse will independently verbalize and demo proper completion of initial HEP to continue with core/LE strengthening and LE stretches.   Time 2   Period Weeks   Status New   PT SHORT TERM GOAL #2   Title Patient will independently verbalize 5/5 fall precautions in order to reduce the risk for falls with functional mobility activities.    Time 2   Period Weeks   Status New   PT SHORT TERM GOAL #3   Title Patient will report decreased max R LE radicular pain to 5/10 on a VAS in order to improve quality of life and tolerance with lying in supine.    Time 3   Period Weeks   Status New   PT SHORT TERM GOAL #4   Title Patient will complete sit<>stand transfer with CGA x1 with less difficulty assessed in order to imrpove safety and independence with transfers.    Time 4   Period Weeks   Status New           PT Long Term Goals - 03/31/15 1950    PT LONG TERM GOAL #1   Title  Patient/spouse will independently verbalize and demo proper completion of advanced HEP to continue with core/LE strengthening and LE stretches once DC from PT.    Time 7   Period Weeks   Status New   PT LONG TERM GOAL #2   Title Patient will report decreased R LE radicular pain to 0-3/10 with less exacerbations in order to improve ability to complete  transfers with less pain and limitations.    Time 7   Period Weeks   Status New   PT LONG TERM GOAL #3   Title Patient will improve B hip and core strength by 1 MMT grade in order to imrpove performance with transfers.    Time 7   Period Weeks   Status New   PT LONG TERM GOAL #4   Title Patient will improve his FOTO limitation score  to <40% in order to progress towards his PLOF.   Time 7   Period Weeks   Status New   PT LONG TERM GOAL #5   Title Patient will be able to statically stand for 30 seconds with B UE support on FWW in order to improve balance with ADLs.    Time 7   Period Weeks   Status New               Plan - 04/28/15 1714    Clinical Impression Statement Pt continues to have extreme LE weakness and Rt groin pain which are inhibiting progress.  difficult to complete majority of therex today due to pain with all movements in groin.  No back pain reported throughout session.  Pt is to return to MD first of next week regarding this.     Rehab Potential Fair   Clinical Impairments Affecting Rehab Potential B LE weakness and non-ambulatory    PT Frequency 3x / week   PT Duration --  7 weeks   PT Treatment/Interventions ADLs/Self Care Home Management;Cryotherapy;Electrical Stimulation;Moist Heat;Gait training;DME Instruction;Therapeutic activities;Therapeutic exercise;Balance training;Neuromuscular re-education;Patient/family education;Taping;Passive range of motion;Manual techniques   PT Next Visit Plan Pt to f/u with MD concerning groin pain by early next week.  Continue current PT POC to improve core stabilization, seated lumbar flexion and proximal musculature strengthening.  Manual Rt LE distraction for pain relief and progress transfers and standings as able.     PT Home Exercise Plan  Reviewed HEP with spouse and patient with good understanding verbalized by both   Consulted and Agree with Plan of Care Patient;Family member/caregiver   Family Member Consulted  spouse      Patient will benefit from skilled therapeutic intervention in order to improve the following deficits and impairments:  Difficulty walking, Postural dysfunction, Decreased activity tolerance, Decreased mobility, Decreased balance, Decreased range of motion, Pain, Decreased coordination  Visit Diagnosis: Radiculopathy, lumbar region  Pain of right leg  Muscle weakness (generalized)  Difficulty in walking, not elsewhere classified     Problem List Patient Active Problem List   Diagnosis Date Noted  . Memory difficulty 08/04/2014  . Numbness and tingling of right arm 08/04/2014  . Abnormality of gait 08/01/2013  . Anemia 12/05/2012  . Early satiety 12/03/2012  . History of colon polyps 12/03/2012  . Facial droop 10/12/2012  . Spinal stenosis of cervical region 07/01/2012  . GERD 05/27/2009  . DYSPHAGIA UNSPECIFIED 05/27/2009  . CHANGE IN BOWELS 05/27/2009  . SMOKER 09/09/2008  . HYPERTENSION 09/09/2008  . CVA 09/09/2008  . CONSTIPATION 09/09/2008  . HIGH BLOOD PRESSURE 07/25/2006    Teena Irani, PTA/CLT [phone]  04/28/2015, 5:33 PM  [facility] [address] [place], [place], [address] Phone: [phone]   Fax:  [phone]  Name: Markael Hyder MRN: [id number] Date of Birth: [birth year]

## 2015-04-29 ENCOUNTER — Ambulatory Visit (HOSPITAL_COMMUNITY): Payer: Medicare Other | Admitting: Physical Therapy

## 2015-05-04 ENCOUNTER — Ambulatory Visit (HOSPITAL_COMMUNITY): Payer: Medicare Other | Attending: Anesthesiology | Admitting: Physical Therapy

## 2015-05-04 DIAGNOSIS — R262 Difficulty in walking, not elsewhere classified: Secondary | ICD-10-CM | POA: Diagnosis present

## 2015-05-04 DIAGNOSIS — M6281 Muscle weakness (generalized): Secondary | ICD-10-CM

## 2015-05-04 DIAGNOSIS — M79604 Pain in right leg: Secondary | ICD-10-CM | POA: Diagnosis present

## 2015-05-04 DIAGNOSIS — R2681 Unsteadiness on feet: Secondary | ICD-10-CM

## 2015-05-04 DIAGNOSIS — M5416 Radiculopathy, lumbar region: Secondary | ICD-10-CM | POA: Diagnosis not present

## 2015-05-04 NOTE — Therapy (Signed)
[place], [place], [address] Phone: [phone]   Fax:  [phone]  Physical Therapy Treatment (Re-Assess)  Patient Details  Name: [name] MRN: [id number] Date of Birth: Feb 13, 1934 Referring Provider: Dr. Clydell Hakim  Encounter Date: 05/04/2015      PT End of Session - 05/04/15 1743    Visit Number 8   Number of Visits 12   Date for PT Re-Evaluation 05/18/15   Authorization Type [employer] (G-code done 8th session)   Authorization Time Period 03/31/2015 to 05/19/2015    Authorization - Visit Number 8   Authorization - Number of Visits 18   PT Start Time 5366   PT Stop Time 1730   PT Time Calculation (min) 42 min   Equipment Utilized During Treatment Gait belt   Activity Tolerance Patient limited by pain   Behavior During Therapy [facility] for tasks assessed/performed      Past Medical History  Diagnosis Date  . Hypertension   . Stroke [facility])     Left side weakness.  . Shortness of breath     with activity  . Sleep apnea     does not use CPAP.  Tested maybe 5- 10 years ago  . GERD (gastroesophageal reflux disease)   . Constipation   . Glaucoma   . Abnormality of gait 08/01/2013  . Blindness of left eye     Posttraumatic  . Arthritis   . Radiculopathy   . ED (erectile dysfunction)   . Gastroparesis   . Unilateral inguinal hernia   . Osteoarthritis   . Carotid artery disease ([place])   . Disc disease, degenerative, cervical   . Overactive bladder   . Heart disease   . Insomnia   . Hyperlipidemia   . Diverticulitis   . Colon polyp   . Diaphragmatic hernia   . Nicotine dependence   . Memory difficulty 08/04/2014  . Numbness and tingling of right arm 08/04/2014    Past Surgical History  Procedure Laterality Date  . Hernia repair Right     inguinal  . Cervical fusion    . Anterior cervical decomp/discectomy fusion N/A 07/01/2012    Procedure: ANTERIOR CERVICAL DECOMPRESSION/DISCECTOMY FUSION  CERVICAL FIVE-SIX [name] REMOVAL CERVICAL SIX-SEVEN;  Surgeon: [name], MD;  Location: [place] NEURO ORS;  Service: Neurosurgery;  Laterality: N/A;  . Esophagogastroduodenoscopy  04/14/2002    YQI:[id number]' ring, status post dilation as described above/Hiatal hernia, focal antral erosions of uncertain clinical significance  . Colonoscopy   08/04/2002      RMR: Normal rectum/Pancolonic diverticula/Colonic polyps as described above, biopsied and/or snared  . Esophagogastroduodenoscopy  06/09/2009    RMR: normal esophagus s/p dilator/small hiatal hernia otherwise normal  . Colonoscopy  60/08/2009    RMR: normal rectum/left and right sided diverticula/multiple colonic polyps. tubular adenomas. surveillance due 2014  . Colonoscopy with esophagogastroduodenoscopy (egd) N/A 12/18/2012    Dr. [name]:  Colonic diverticulosis. Multiple colonic polyps-hyperplastic polyps and tubular adenoma. Friable anal canal hemorrhoids. EGD with chronic atrophic gastritis  . Inguinal hernia repair Left 04/15/2014    Procedure: LEFT INGUINAL HERNIORRHAPHY WITH MESH;  Surgeon: [name], MD;  Location: [facility];  Service: General;  Laterality: Left;  . Insertion of mesh Left 04/15/2014    Procedure: INSERTION OF MESH;  Surgeon: [name], MD;  Location: [facility]S;  Service: General;  Laterality: Left;    There were no vitals filed for this visit.  Subjective Assessment - 05/04/15 1652    Subjective Patient reports that MD thinks that pain in his hips is due to OA in both hips, maybe also possibly due to hernia. He reports he cannot tell a huge difference since he's started skilled PT services. HE still has a hard time walking and standing up straight; his left side is weaker stil and he feels that this is affecting his walking. Cannot name anything specifically that has gotten easier.    Patient is accompained by: Family member   Pertinent History HTN, CVA (2002), cervical fusion x 2, CAD, and blind in L  eye    How long can you stand comfortably? 5/2- few seconds still    How long can you walk comfortably? 5/2- still not non-ambulatroy    Patient Stated Goals Patient's goal is to reduce his R LE pain and be able to stand/walk again if possible.    Currently in Pain? Yes   Pain Score 5    Pain Location Groin   Pain Orientation Right   Pain Descriptors / Indicators Aching   Pain Type Chronic pain   Pain Radiating Towards into R knee    Pain Onset More than a month ago   Pain Frequency Intermittent   Aggravating Factors  weight bearing, laying in supine    Pain Relieving Factors sitting and pain meds    Effect of Pain on Daily Activities limits weight bearing tasks and activities, as well as ability to lay supine             [facility] PT Assessment - 05/04/15 0001    AROM   Overall AROM Comments hip ROM limited by pain, moderate limitation however hip flexion assists in reducing pain    Strength   Right Hip Flexion 3+/5   Right Hip Extension --  DNT due to pain    Right Hip ABduction 2/5   Left Hip Flexion 3+/5   Left Hip ABduction --  unable due to pain R hip    Right Knee Flexion 4+/5   Right Knee Extension 4/5   Left Knee Flexion 4/5   Left Knee Extension 5/5   Right Ankle Dorsiflexion 4/5   Left Ankle Dorsiflexion 4/5   Flexibility   Hamstrings Mod tightness, bilateral    Piriformis min-mod tightness, B                      [facility] Adult PT Treatment/Exercise - 05/04/15 0001    Knee/Hip Exercises: Standing   Other Standing Knee Exercises standing approx 30 seconds with walker, CGA    Knee/Hip Exercises: Seated   Other Seated Knee/Hip Exercises seated hip flexion with 2# 1x16, hip ABD with red TB 1x15, resisted hip extension 1x5 each resisted manually with towel                 PT Education - 05/04/15 1742    Education provided Yes   Education Details progress with skilled PT services, plan of care movnig forward    Person(s) Educated Patient    Methods Explanation   Comprehension Verbalized understanding          PT Short Term Goals - 05/04/15 1712    PT SHORT TERM GOAL #1   Title Patient/spouse will independently verbalize and demo proper completion of initial HEP to continue with core/LE strengthening and LE stretches.   Baseline 5/2- pain limited but doing them every evening    Time 2   Period  Weeks   Status Partially Met   PT SHORT TERM GOAL #2   Title Patient will independently verbalize 5/5 fall precautions in order to reduce the risk for falls with functional mobility activities.    Baseline 5/2- needed education    Time 2   Period Weeks   Status On-going   PT SHORT TERM GOAL #3   Title Patient will report decreased max R LE radicular pain to 5/10 on a VAS in order to improve quality of life and tolerance with lying in supine.    Baseline 5/2- on avergae about 7/10   Time 3   Period Weeks   Status On-going   PT SHORT TERM GOAL #4   Title Patient will complete sit<>stand transfer with CGA x1 with less difficulty assessed in order to imrpove safety and independence with transfers.    Baseline 5/2- patient reports some difference in transfers    Time 4   Period Weeks   Status Partially Met           PT Long Term Goals - 05/04/15 1717    PT LONG TERM GOAL #1   Title  Patient/spouse will independently verbalize and demo proper completion of advanced HEP to continue with core/LE strengthening and LE stretches once DC from PT.    Time 7   Period Weeks   Status On-going   PT LONG TERM GOAL #2   Title Patient will report decreased R LE radicular pain to 0-3/10 with less exacerbations in order to improve ability to complete transfers with less pain and limitations.    Time 7   Period Weeks   Status On-going   PT LONG TERM GOAL #3   Title Patient will improve B hip and core strength by 1 MMT grade in order to imrpove performance with transfers.    Time 7   Period Weeks   Status On-going   PT LONG TERM GOAL  #4   Title Patient will improve his FOTO limitation score to <40% in order to progress towards his PLOF.   Time 7   Period Weeks   Status Deferred   PT LONG TERM GOAL #5   Title Patient will be able to statically stand for 30 seconds with B UE support on FWW in order to improve balance with ADLs.    Time 7   Period Weeks   Status On-going               Plan - 05/04/15 1744    Clinical Impression Statement Re-assessment performed today. Patient continues to be limited by pain in LEs and groin pain today, reports improved ease of transfers with skilled PT services but states that he has not noticed a big difference otherwise. Suspect possible more extensive hip pathology, besides arthritis, due to pain today with distraction, diffficulty straightening up from flexed position in standing, and positive FABER.  At this point would ilke to trial chagne in plan of care to see if this effects patient status, chagne to include aggressive seated hip and core strength in sitting, as this is patient's most comfortable position, in order to relieve pain in hips and back and potentially assist  in improving standing tolerance overall. Patient hesitant to contniue with skilled PT services but reports taht he will "think about it". AT this piont recommend continuation of trial of skilled PT services with change in POC, iwth re-assess in 2 weeks to assess patient tolerance of this plan.    Rehab Potential Fair  Clinical Impairments Affecting Rehab Potential B LE weakness and non-ambulatory    PT Frequency Other (comment)  4 more sessions    PT Duration Other (comment)  4 more sessoins    PT Treatment/Interventions ADLs/Self Care Home Management;Cryotherapy;Electrical Stimulation;Moist Heat;Gait training;DME Instruction;Therapeutic activities;Therapeutic exercise;Balance training;Neuromuscular re-education;Patient/family education;Taping;Passive range of motion;Manual techniques   PT Next Visit Plan  chagne in POC: aggressive strengthening in sitting for hips and core to be main focus. Progress standing and transfers as able. Check leg lengths.    PT Home Exercise Plan  Reviewed HEP with spouse and patient with good understanding verbalized by both   Consulted and Agree with Plan of Care Patient      Patient will benefit from skilled therapeutic intervention in order to improve the following deficits and impairments:  Difficulty walking, Postural dysfunction, Decreased activity tolerance, Decreased mobility, Decreased balance, Decreased range of motion, Pain, Decreased coordination  Visit Diagnosis: Radiculopathy, lumbar region  Pain of right leg  Muscle weakness (generalized)  Difficulty in walking, not elsewhere classified  Unsteadiness on feet       G-Codes - 2015-05-07 1750    Functional Assessment Tool Used Based on clinical findings ( MMT, balance, transfers, pain levels, ROM)    Functional Limitation Mobility: Walking and moving around   Mobility: Walking and Moving Around Current Status ([id number]) At least 60 percent but less than 80 percent impaired, limited or restricted   Mobility: Walking and Moving Around Goal Status [phone]) At least 40 percent but less than 60 percent impaired, limited or restricted      Problem List Patient Active Problem List   Diagnosis Date Noted  . Memory difficulty 08/04/2014  . Numbness and tingling of right arm 08/04/2014  . Abnormality of gait 08/01/2013  . Anemia 12/05/2012  . Early satiety 12/03/2012  . History of colon polyps 12/03/2012  . Facial droop 10/12/2012  . Spinal stenosis of cervical region 07/01/2012  . GERD 05/27/2009  . DYSPHAGIA UNSPECIFIED 05/27/2009  . CHANGE IN BOWELS 05/27/2009  . SMOKER 09/09/2008  . HYPERTENSION 09/09/2008  . CVA 09/09/2008  . CONSTIPATION 09/09/2008  . HIGH BLOOD PRESSURE 07/25/2006    Deniece Ree PT, DPT [place] [address] [place], [place], [address] Phone: [phone]   Fax:  [phone]  Name: Isiaih Hollenbach MRN: [id number] Date of Birth: [birth year]

## 2015-05-06 ENCOUNTER — Encounter (HOSPITAL_COMMUNITY): Payer: Medicare Other | Admitting: Physical Therapy

## 2015-05-06 ENCOUNTER — Telehealth (HOSPITAL_COMMUNITY): Payer: Self-pay

## 2015-05-06 NOTE — Telephone Encounter (Signed)
05/06/15 wife came by and said to cancel all remaining appts - didn't seem to be doing him any good

## 2015-05-11 ENCOUNTER — Encounter (HOSPITAL_COMMUNITY): Payer: Medicare Other | Admitting: Physical Therapy

## 2015-05-13 ENCOUNTER — Encounter (HOSPITAL_COMMUNITY): Payer: Medicare Other | Admitting: Physical Therapy

## 2015-05-18 ENCOUNTER — Encounter (HOSPITAL_COMMUNITY): Payer: Medicare Other | Admitting: Physical Therapy

## 2015-05-20 ENCOUNTER — Other Ambulatory Visit: Payer: Self-pay | Admitting: Neurology

## 2015-06-16 ENCOUNTER — Other Ambulatory Visit (HOSPITAL_COMMUNITY): Payer: Self-pay | Admitting: Family Medicine

## 2015-06-16 ENCOUNTER — Other Ambulatory Visit (HOSPITAL_COMMUNITY): Payer: Self-pay | Admitting: Internal Medicine

## 2015-06-16 ENCOUNTER — Other Ambulatory Visit (HOSPITAL_COMMUNITY): Payer: Self-pay | Admitting: Psychiatry

## 2015-06-16 ENCOUNTER — Ambulatory Visit (HOSPITAL_COMMUNITY)
Admission: RE | Admit: 2015-06-16 | Discharge: 2015-06-16 | Disposition: A | Discharge status: Home / Self Care | Payer: Medicare Other | Source: Ambulatory Visit | Attending: Family Medicine | Admitting: Family Medicine

## 2015-06-16 DIAGNOSIS — R059 Cough, unspecified: Secondary | ICD-10-CM

## 2015-06-16 DIAGNOSIS — M5412 Radiculopathy, cervical region: Secondary | ICD-10-CM

## 2015-06-16 DIAGNOSIS — I6522 Occlusion and stenosis of left carotid artery: Secondary | ICD-10-CM | POA: Insufficient documentation

## 2015-06-16 DIAGNOSIS — I517 Cardiomegaly: Secondary | ICD-10-CM | POA: Insufficient documentation

## 2015-06-16 DIAGNOSIS — R05 Cough: Secondary | ICD-10-CM | POA: Insufficient documentation

## 2015-06-16 DIAGNOSIS — M5382 Other specified dorsopathies, cervical region: Secondary | ICD-10-CM | POA: Diagnosis not present

## 2015-06-16 DIAGNOSIS — Z981 Arthrodesis status: Secondary | ICD-10-CM | POA: Insufficient documentation

## 2015-06-21 ENCOUNTER — Other Ambulatory Visit (HOSPITAL_COMMUNITY): Payer: Self-pay | Admitting: Family Medicine

## 2015-06-21 DIAGNOSIS — M5412 Radiculopathy, cervical region: Secondary | ICD-10-CM

## 2015-06-25 ENCOUNTER — Ambulatory Visit (HOSPITAL_COMMUNITY)
Admission: RE | Admit: 2015-06-25 | Discharge: 2015-06-25 | Disposition: A | Discharge status: Home / Self Care | Payer: Medicare Other | Source: Ambulatory Visit | Attending: Family Medicine | Admitting: Family Medicine

## 2015-06-25 DIAGNOSIS — M5023 Other cervical disc displacement, cervicothoracic region: Secondary | ICD-10-CM | POA: Diagnosis not present

## 2015-06-25 DIAGNOSIS — M5000 Cervical disc disorder with myelopathy, unspecified cervical region: Secondary | ICD-10-CM | POA: Insufficient documentation

## 2015-06-25 DIAGNOSIS — M4802 Spinal stenosis, cervical region: Secondary | ICD-10-CM | POA: Diagnosis not present

## 2015-06-25 DIAGNOSIS — M5412 Radiculopathy, cervical region: Secondary | ICD-10-CM | POA: Diagnosis present

## 2015-06-28 ENCOUNTER — Emergency Department (HOSPITAL_COMMUNITY)
Admission: EM | Admit: 2015-06-28 | Discharge: 2015-06-28 | Disposition: A | Discharge status: Home / Self Care | Payer: Medicare Other | Attending: Emergency Medicine | Admitting: Emergency Medicine

## 2015-06-28 ENCOUNTER — Other Ambulatory Visit (HOSPITAL_COMMUNITY): Payer: Self-pay | Admitting: Family Medicine

## 2015-06-28 ENCOUNTER — Encounter (HOSPITAL_COMMUNITY): Payer: Self-pay | Admitting: Emergency Medicine

## 2015-06-28 DIAGNOSIS — Z7982 Long term (current) use of aspirin: Secondary | ICD-10-CM | POA: Insufficient documentation

## 2015-06-28 DIAGNOSIS — Z8673 Personal history of transient ischemic attack (TIA), and cerebral infarction without residual deficits: Secondary | ICD-10-CM | POA: Diagnosis not present

## 2015-06-28 DIAGNOSIS — N390 Urinary tract infection, site not specified: Secondary | ICD-10-CM | POA: Insufficient documentation

## 2015-06-28 DIAGNOSIS — R109 Unspecified abdominal pain: Secondary | ICD-10-CM | POA: Diagnosis not present

## 2015-06-28 DIAGNOSIS — F17210 Nicotine dependence, cigarettes, uncomplicated: Secondary | ICD-10-CM | POA: Diagnosis not present

## 2015-06-28 DIAGNOSIS — Z79899 Other long term (current) drug therapy: Secondary | ICD-10-CM | POA: Insufficient documentation

## 2015-06-28 DIAGNOSIS — I10 Essential (primary) hypertension: Secondary | ICD-10-CM | POA: Diagnosis not present

## 2015-06-28 DIAGNOSIS — M1990 Unspecified osteoarthritis, unspecified site: Secondary | ICD-10-CM | POA: Diagnosis not present

## 2015-06-28 DIAGNOSIS — E785 Hyperlipidemia, unspecified: Secondary | ICD-10-CM | POA: Insufficient documentation

## 2015-06-28 DIAGNOSIS — R300 Dysuria: Secondary | ICD-10-CM | POA: Diagnosis present

## 2015-06-28 LAB — CBC WITH DIFFERENTIAL/PLATELET
Basophils Absolute: 0 10*3/uL (ref 0.0–0.1)
Basophils Relative: 0 %
EOS ABS: 0.1 10*3/uL (ref 0.0–0.7)
EOS PCT: 1 %
HCT: 40.3 % (ref 39.0–52.0)
Hemoglobin: 13.6 g/dL (ref 13.0–17.0)
LYMPHS ABS: 1.8 10*3/uL (ref 0.7–4.0)
LYMPHS PCT: 9 %
MCH: 31.3 pg (ref 26.0–34.0)
MCHC: 33.7 g/dL (ref 30.0–36.0)
MCV: 92.9 fL (ref 78.0–100.0)
MONO ABS: 1.8 10*3/uL — AB (ref 0.1–1.0)
Monocytes Relative: 10 %
Neutro Abs: 15.3 10*3/uL — ABNORMAL HIGH (ref 1.7–7.7)
Neutrophils Relative %: 80 %
PLATELETS: 144 10*3/uL — AB (ref 150–400)
RBC: 4.34 MIL/uL (ref 4.22–5.81)
RDW: 14.3 % (ref 11.5–15.5)
WBC: 19 10*3/uL — ABNORMAL HIGH (ref 4.0–10.5)

## 2015-06-28 LAB — BASIC METABOLIC PANEL
Anion gap: 7 (ref 5–15)
BUN: 16 mg/dL (ref 6–20)
CALCIUM: 8.7 mg/dL — AB (ref 8.9–10.3)
CO2: 25 mmol/L (ref 22–32)
CREATININE: 0.87 mg/dL (ref 0.61–1.24)
Chloride: 106 mmol/L (ref 101–111)
GFR calc Af Amer: >60 mL/min (ref >60)
Glucose, Bld: 113 mg/dL — ABNORMAL HIGH (ref 65–99)
Potassium: 3.4 mmol/L — ABNORMAL LOW (ref 3.5–5.1)
SODIUM: 138 mmol/L (ref 135–145)

## 2015-06-28 LAB — URINALYSIS, ROUTINE W REFLEX MICROSCOPIC
BILIRUBIN URINE: NEGATIVE
GLUCOSE, UA: NEGATIVE mg/dL
Ketones, ur: NEGATIVE mg/dL
Nitrite: NEGATIVE
PROTEIN: NEGATIVE mg/dL
Specific Gravity, Urine: 1.01 (ref 1.005–1.030)
pH: 6 (ref 5.0–8.0)

## 2015-06-28 LAB — URINE MICROSCOPIC-ADD ON

## 2015-06-28 MED ORDER — CEPHALEXIN 500 MG PO CAPS: ORAL_CAPSULE | ORAL | Status: DC

## 2015-06-28 MED ORDER — SODIUM CHLORIDE 0.9 % IV BOLUS (SEPSIS)
1000.0000 mL | Freq: Once | INTRAVENOUS | Status: AC
  Administered 2015-06-28: 1000 mL via INTRAVENOUS
  Filled 2015-06-28: qty 1000

## 2015-06-28 MED ORDER — DEXTROSE 5 % IV SOLN
1.0000 g | Freq: Once | INTRAVENOUS | Status: AC
  Administered 2015-06-28: 1 g via INTRAVENOUS
  Filled 2015-06-28: qty 10

## 2015-06-28 MED ORDER — SODIUM CHLORIDE 0.9 % IV BOLUS (SEPSIS)
1000.0000 mL | Freq: Once | INTRAVENOUS | Status: AC
  Administered 2015-06-28: 1000 mL via INTRAVENOUS

## 2015-06-28 NOTE — ED Notes (Signed)
Patient given discharge instruction, verbalized understand. IV removed, band aid applied. Patient ambulatory out of the department.  

## 2015-06-28 NOTE — ED Notes (Signed)
Pt voided, no complaints of pain

## 2015-06-28 NOTE — ED Provider Notes (Signed)
CSN: [id number]     Arrival date & time 06/28/15  1035 History   First MD Initiated Contact with Patient 06/28/15 1251     Chief Complaint  Patient presents with  . Dysuria     (Consider location/radiation/quality/duration/timing/severity/associated sxs/prior Treatment) Patient is a 81 y.o. male presenting with dysuria.  Dysuria This is a new problem. The current episode started yesterday. The problem occurs constantly. Associated symptoms include abdominal pain (suprpubic). Pertinent negatives include no chest pain. Nothing aggravates the symptoms. Nothing relieves the symptoms.    Past Medical History  Diagnosis Date  . Hypertension   . Stroke [facility])     Left side weakness.  . Shortness of breath     with activity  . Sleep apnea     does not use CPAP.  Tested maybe 5- 10 years ago  . GERD (gastroesophageal reflux disease)   . Constipation   . Glaucoma   . Abnormality of gait 08/01/2013  . Blindness of left eye     Posttraumatic  . Arthritis   . Radiculopathy   . ED (erectile dysfunction)   . Gastroparesis   . Unilateral inguinal hernia   . Osteoarthritis   . Carotid artery disease ([place])   . Disc disease, degenerative, cervical   . Overactive bladder   . Heart disease   . Insomnia   . Hyperlipidemia   . Diverticulitis   . Colon polyp   . Diaphragmatic hernia   . Nicotine dependence   . Memory difficulty 08/04/2014  . Numbness and tingling of right arm 08/04/2014   Past Surgical History  Procedure Laterality Date  . Hernia repair Right     inguinal  . Cervical fusion    . Anterior cervical decomp/discectomy fusion N/A 07/01/2012    Procedure: ANTERIOR CERVICAL DECOMPRESSION/DISCECTOMY FUSION CERVICAL FIVE-SIX [name] REMOVAL CERVICAL SIX-SEVEN;  Surgeon: [name], MD;  Location: [place] NEURO ORS;  Service: Neurosurgery;  Laterality: N/A;  . Esophagogastroduodenoscopy  04/14/2002    [id number] ring, status post dilation as described above/Hiatal hernia, focal  antral erosions of uncertain clinical significance  . Colonoscopy   08/04/2002      RMR: Normal rectum/Pancolonic diverticula/Colonic polyps as described above, biopsied and/or snared  . Esophagogastroduodenoscopy  06/09/2009    RMR: normal esophagus s/p dilator/small hiatal hernia otherwise normal  . Colonoscopy  60/08/2009    RMR: normal rectum/left and right sided diverticula/multiple colonic polyps. tubular adenomas. surveillance due 2014  . Colonoscopy with esophagogastroduodenoscopy (egd) N/A 12/18/2012    Dr. [name]:  Colonic diverticulosis. Multiple colonic polyps-hyperplastic polyps and tubular adenoma. Friable anal canal hemorrhoids. EGD with chronic atrophic gastritis  . Inguinal hernia repair Left 04/15/2014    Procedure: LEFT INGUINAL HERNIORRHAPHY WITH MESH;  Surgeon: [name], MD;  Location: [facility];  Service: General;  Laterality: Left;  . Insertion of mesh Left 04/15/2014    Procedure: INSERTION OF MESH;  Surgeon: [name], MD;  Location: [facility] ORS;  Service: General;  Laterality: Left;   Family History  Problem Relation Age of Onset  . Colon cancer Neg Hx   . Cancer Mother   . Dementia Father    Social History  Substance Use Topics  . Smoking status: Current Every Day Smoker -- 0.50 packs/day for 60 years    Types: Cigarettes  . Smokeless tobacco: Never Used  . Alcohol Use: No    Review of Systems  Constitutional: Positive for fever. Negative for activity change and appetite change.  Cardiovascular: Negative for  chest pain.  Gastrointestinal: Positive for abdominal pain (suprpubic). Negative for nausea and vomiting.  Genitourinary: Positive for dysuria.  All other systems reviewed and are negative.     Allergies  Aspirin  Home Medications   Prior to Admission medications   Medication Sig Start Date End Date Taking? Authorizing Provider  aspirin EC 81 MG tablet Take 81 mg by mouth daily.   Yes Historical Provider, MD  cholecalciferol (VITAMIN D)  1000 UNITS tablet Take 1,000 Units by mouth daily.   Yes Historical Provider, MD  citalopram (CELEXA) 20 MG tablet Take 20 mg by mouth daily.   Yes Historical Provider, MD  diclofenac sodium (VOLTAREN) 1 % GEL Apply 2 g topically 2 (two) times daily.   Yes Historical Provider, MD  donepezil (ARICEPT) 10 MG tablet TAKE (1) TABLET BY MOUTH AT BEDTIME. 05/20/15  Yes Kathrynn Ducking, MD  fluticasone [facility]) 50 MCG/ACT nasal spray Place 2 sprays into the nose daily as needed for rhinitis.   Yes Historical Provider, MD  furosemide (LASIX) 40 MG tablet Take 40 mg by mouth daily as needed for fluid.   Yes Historical Provider, MD  HYDROcodone-acetaminophen (NORCO/VICODIN) 5-325 MG tablet Take 1 tablet by mouth 2 (two) times daily as needed for moderate pain.   Yes Historical Provider, MD  lactulose (CHRONULAC) 10 GM/15ML solution Take 20 g by mouth at bedtime.   Yes Historical Provider, MD  levofloxacin (LEVAQUIN) 500 MG tablet Take 500 mg by mouth daily.   Yes Historical Provider, MD  lisinopril (PRINIVIL,ZESTRIL) 10 MG tablet Take 10 mg by mouth daily.   Yes Historical Provider, MD  LORazepam (ATIVAN) 0.5 MG tablet Take 1 mg by mouth at bedtime as needed for sleep.   Yes Historical Provider, MD  lubiprostone (AMITIZA) 24 MCG capsule Take 1 capsule (24 mcg total) by mouth 2 (two) times daily with a meal. Patient taking differently: Take 24 mcg by mouth daily with breakfast.  12/03/12  Yes Orvil Feil, NP  Melatonin 3 MG CAPS Take 3 capsules by mouth at bedtime.   Yes Historical Provider, MD  metoCLOPramide (REGLAN) 10 MG tablet Take 10 mg by mouth at bedtime.   Yes Historical Provider, MD  Multiple Vitamin (MULTIVITAMIN WITH MINERALS) TABS Take 1 tablet by mouth daily.   Yes Historical Provider, MD  naproxen (NAPROSYN) 500 MG tablet Take 500 mg by mouth 2 (two) times daily with a meal.   Yes Historical Provider, MD  nitroGLYCERIN (NITROSTAT) 0.4 MG SL tablet Place 0.4 mg under the tongue every 5 (five)  minutes as needed for chest pain.   Yes Historical Provider, MD  omeprazole (PRILOSEC) 20 MG capsule Take 20 mg by mouth daily.   Yes Historical Provider, MD  sildenafil (REVATIO) 20 MG tablet Take 20 mg by mouth daily. Up to 5 tablets   Yes Historical Provider, MD  solifenacin (VESICARE) 5 MG tablet Take 5 mg by mouth daily.   Yes Historical Provider, MD  traMADol-acetaminophen (ULTRACET) 37.5-325 MG tablet Take 1 tablet by mouth every 4 (four) hours as needed for moderate pain.   Yes Historical Provider, MD  traZODone (DESYREL) 50 MG tablet Take 50 mg by mouth at bedtime.   Yes Historical Provider, MD  cephALEXin (KEFLEX) 500 MG capsule 2 caps po bid x 7 days 06/28/15   Merrily Pew, MD   BP 162/78 mmHg  Pulse 77  Temp(Src) 98.6 F (37 C) (Oral)  Resp 18  Ht 5\' 9"  (1.753 m)  Wt 170 lb (  77.111 kg)  BMI 25.09 kg/m2  SpO2 98% Physical Exam  Constitutional: He is oriented to person, place, and time. He appears well-developed and well-nourished.  HENT:  Head: Normocephalic and atraumatic.  Mouth/Throat: Mucous membranes are dry.  Neck: Normal range of motion.  Cardiovascular: Normal rate.   Pulmonary/Chest: Effort normal. No respiratory distress. He has no wheezes.  Abdominal: He exhibits no distension. There is tenderness (suprapubic). There is no rebound and no guarding.  Musculoskeletal: Normal range of motion. He exhibits no edema or tenderness.  Neurological: He is alert and oriented to person, place, and time.  Skin: Skin is warm and dry.  Skin tenting  Nursing note and vitals reviewed.   ED Course  Procedures (including critical care time) Labs Review Labs Reviewed  CBC WITH DIFFERENTIAL/PLATELET - Abnormal; Notable for the following:    WBC 19.0 (*)    Platelets 144 (*)    Neutro Abs 15.3 (*)    Monocytes Absolute 1.8 (*)    All other components within normal limits  BASIC METABOLIC PANEL - Abnormal; Notable for the following:    Potassium 3.4 (*)    Glucose, Bld 113  (*)    Calcium 8.7 (*)    All other components within normal limits  URINALYSIS, ROUTINE W REFLEX MICROSCOPIC (NOT AT [facility]) - Abnormal; Notable for the following:    Hgb urine dipstick SMALL (*)    Leukocytes, UA MODERATE (*)    All other components within normal limits  URINE MICROSCOPIC-ADD ON - Abnormal; Notable for the following:    Squamous Epithelial / LPF 0-5 (*)    Bacteria, UA MANY (*)    All other components within normal limits    Imaging Review No results found. I have personally reviewed and evaluated these images and lab results as part of my medical decision-making.   EKG Interpretation None      MDM   Final diagnoses:  UTI (lower urinary tract infection)    81 year old male with couple days of decreased urine after he had a few days of increased frequency. Some suprapubic tenderness. Initially with no urine in his bladder. suspectied urinary tract infection versus kidney dysfunction so evaluate for both. He had a liter of fluids and patient made some urine which was obviously infected. Started on Rocephin here and given another liter of fluids no evidence of acute kidney injury. Patient tolerating by mouth medications without difficulty. Has other chronic problems such as shoulder pain and leg pain which he'll continue following up with his primary doctor. Otherwise was sent home on 7 days of Keflex and PCP follow-up to make sure his urine clears.  New Prescriptions: Discharge Medication List as of 06/28/2015  3:33 PM    START taking these medications   Details  cephALEXin (KEFLEX) 500 MG capsule 2 caps po bid x 7 days, Print         I have personally and contemperaneously reviewed labs and imaging and used in my decision making as above.   A medical screening exam was performed and I feel the patient has had an appropriate workup for their chief complaint at this time and likelihood of emergent condition existing is low and thus workup can continue on an  outpatient basis.. Their vital signs are stable. They have been counseled on decision, discharge, follow up and which symptoms necessitate immediate return to the emergency department.  They verbally stated understanding and agreement with plan and discharged in stable condition.  Merrily Pew, MD 06/29/15 [phone]

## 2015-06-28 NOTE — ED Notes (Signed)
MD at the bedside  

## 2015-06-28 NOTE — ED Notes (Signed)
Patient states "I can't make no urine. I went this morning but it was only just a little bit, about a teaspoon." States this started yesterday. Complaining of "soreness" to lower abdomen.

## 2015-06-28 NOTE — ED Notes (Signed)
Pt can be discharged after IV fluids and antibiotic

## 2015-07-12 ENCOUNTER — Other Ambulatory Visit (HOSPITAL_COMMUNITY): Payer: Self-pay | Admitting: Orthopedic Surgery

## 2015-07-12 DIAGNOSIS — M542 Cervicalgia: Secondary | ICD-10-CM

## 2015-07-13 ENCOUNTER — Other Ambulatory Visit (HOSPITAL_COMMUNITY)
Admission: RE | Admit: 2015-07-13 | Discharge: 2015-07-13 | Disposition: A | Discharge status: Home / Self Care | Payer: Medicare Other | Source: Ambulatory Visit | Attending: Orthopedic Surgery | Admitting: Orthopedic Surgery

## 2015-07-13 DIAGNOSIS — M542 Cervicalgia: Secondary | ICD-10-CM | POA: Diagnosis present

## 2015-07-13 LAB — CREATININE, SERUM
CREATININE: 1 mg/dL (ref 0.61–1.24)
GFR calc non Af Amer: >60 mL/min (ref >60)

## 2015-07-20 ENCOUNTER — Other Ambulatory Visit: Payer: Self-pay | Admitting: Neurology

## 2015-07-21 ENCOUNTER — Ambulatory Visit (HOSPITAL_COMMUNITY)
Admission: RE | Admit: 2015-07-21 | Discharge: 2015-07-21 | Disposition: A | Discharge status: Home / Self Care | Payer: Medicare Other | Source: Ambulatory Visit | Attending: Orthopedic Surgery | Admitting: Orthopedic Surgery

## 2015-07-21 DIAGNOSIS — M50321 Other cervical disc degeneration at C4-C5 level: Secondary | ICD-10-CM | POA: Insufficient documentation

## 2015-07-21 DIAGNOSIS — Z981 Arthrodesis status: Secondary | ICD-10-CM | POA: Insufficient documentation

## 2015-07-21 DIAGNOSIS — M542 Cervicalgia: Secondary | ICD-10-CM | POA: Diagnosis present

## 2015-07-21 DIAGNOSIS — I639 Cerebral infarction, unspecified: Secondary | ICD-10-CM | POA: Diagnosis not present

## 2015-07-21 DIAGNOSIS — M4802 Spinal stenosis, cervical region: Secondary | ICD-10-CM | POA: Insufficient documentation

## 2015-07-21 MED ORDER — GADOBENATE DIMEGLUMINE 529 MG/ML IV SOLN
15.0000 mL | Freq: Once | INTRAVENOUS | Status: AC | PRN
  Administered 2015-07-21: 15 mL via INTRAVENOUS

## 2015-08-12 DIAGNOSIS — K219 Gastro-esophageal reflux disease without esophagitis: Secondary | ICD-10-CM | POA: Insufficient documentation

## 2015-10-25 ENCOUNTER — Other Ambulatory Visit: Payer: Self-pay | Admitting: Neurology

## 2016-04-11 IMAGING — DX DG LUMBAR SPINE COMPLETE 4+V
5 series · 5 of 5 positions shown · non-contrast
Comparison: CT Abdomen and Pelvis 11/04/2012. Lumbar MRI
07/03/2012.

CLINICAL DATA: 80-year-old male with lumbar back pain radiating to
both hips for 6 weeks. No known injury. Initial encounter.

EXAM:
LUMBAR SPINE - COMPLETE 4+ VIEW

[l-spine ap]
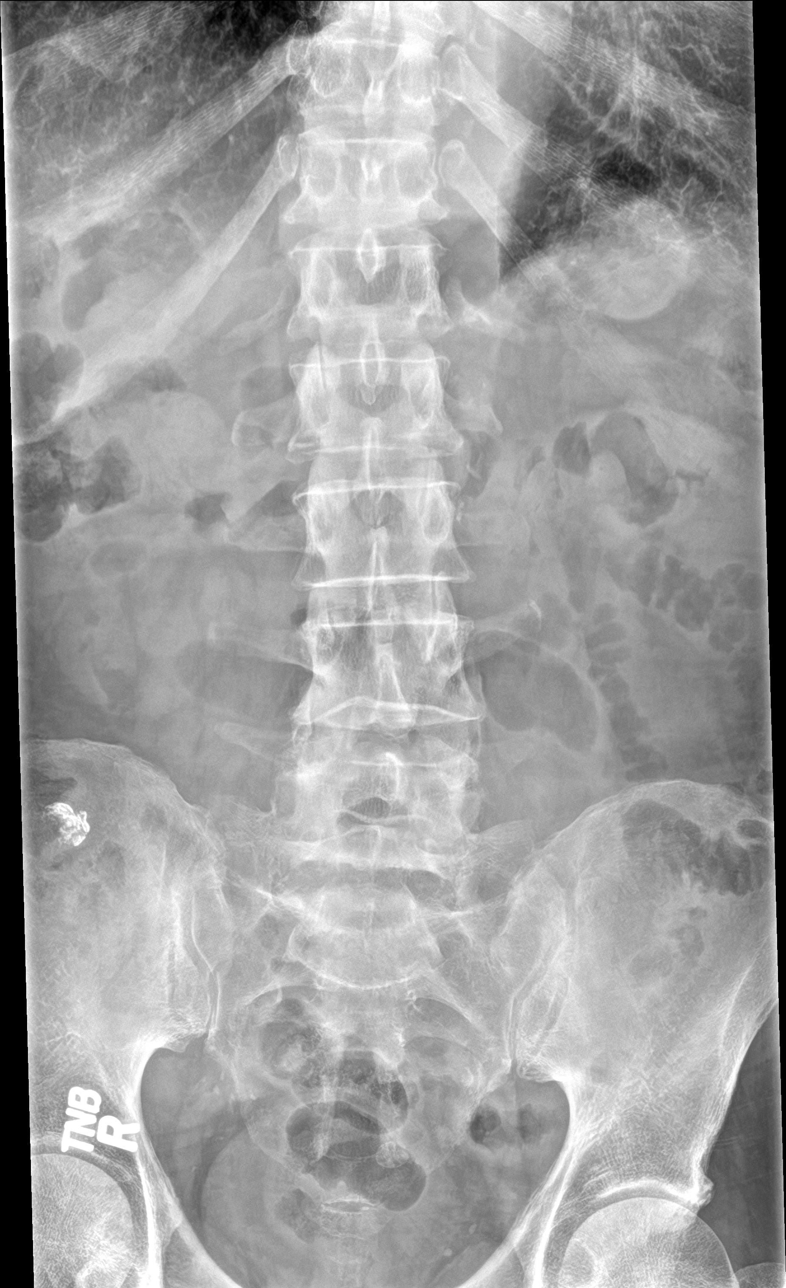

[l-spine obl (1 of 2)]
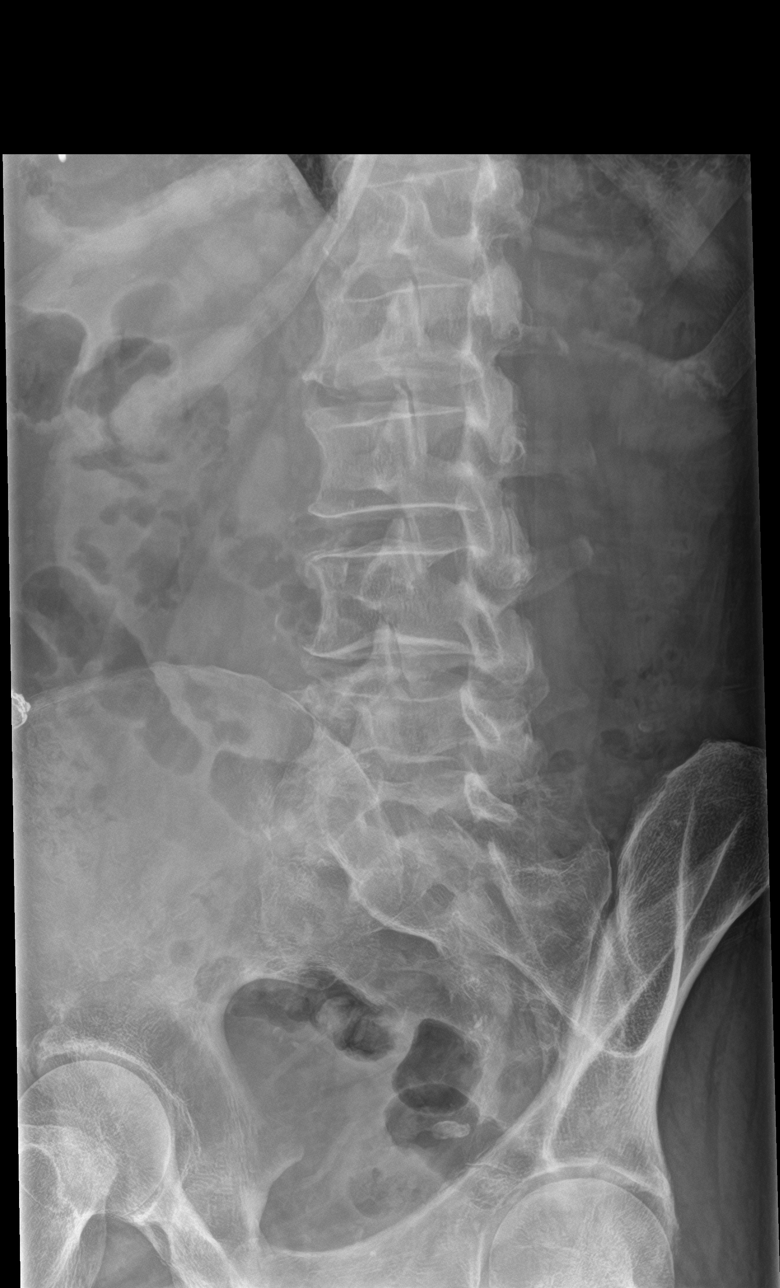

[l-spine obl (2 of 2)]
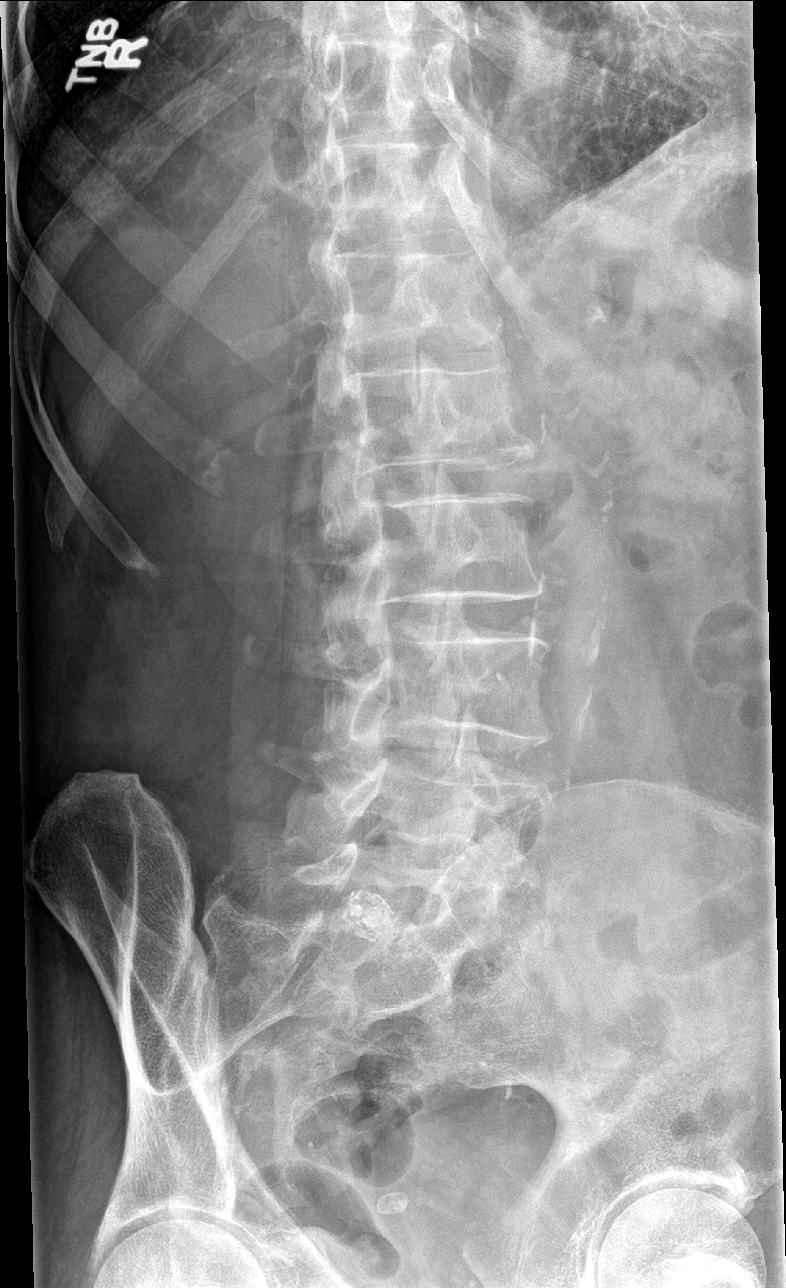

[l-spine lat]
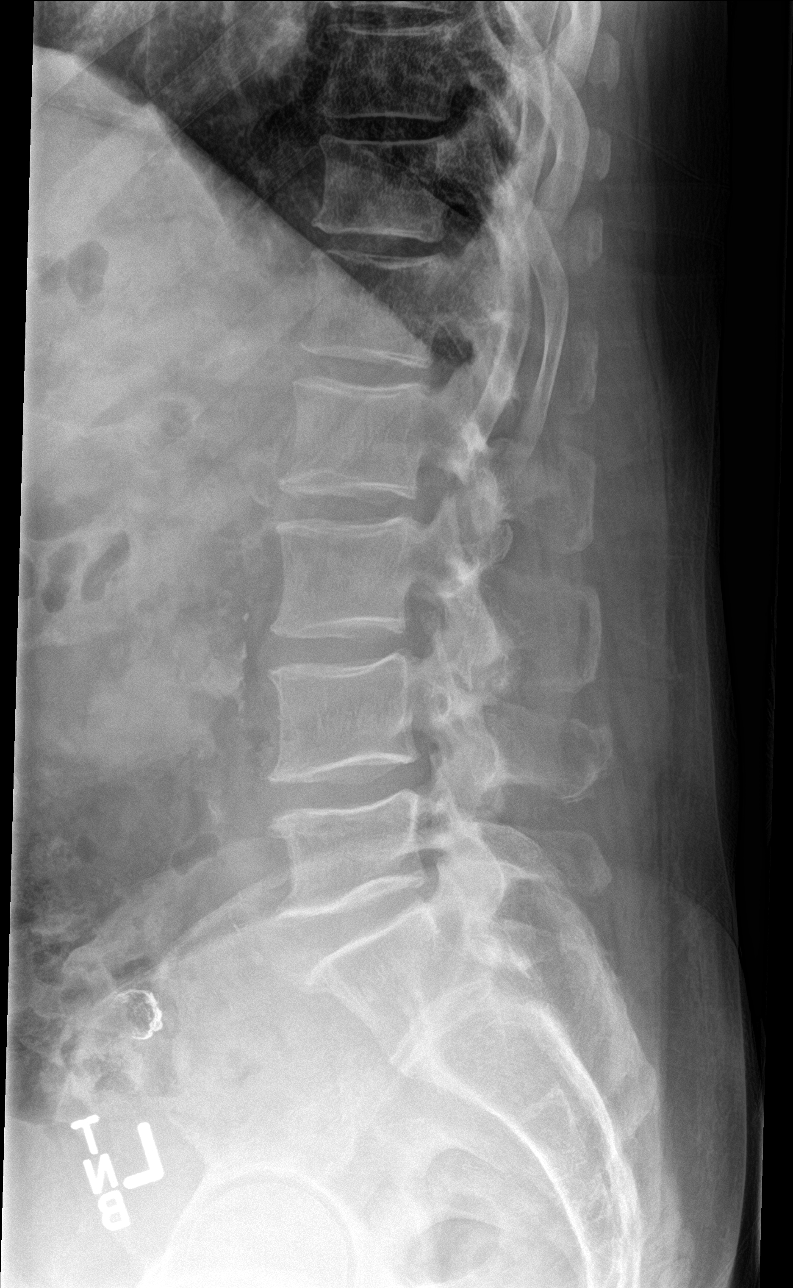

[l-spine spot]
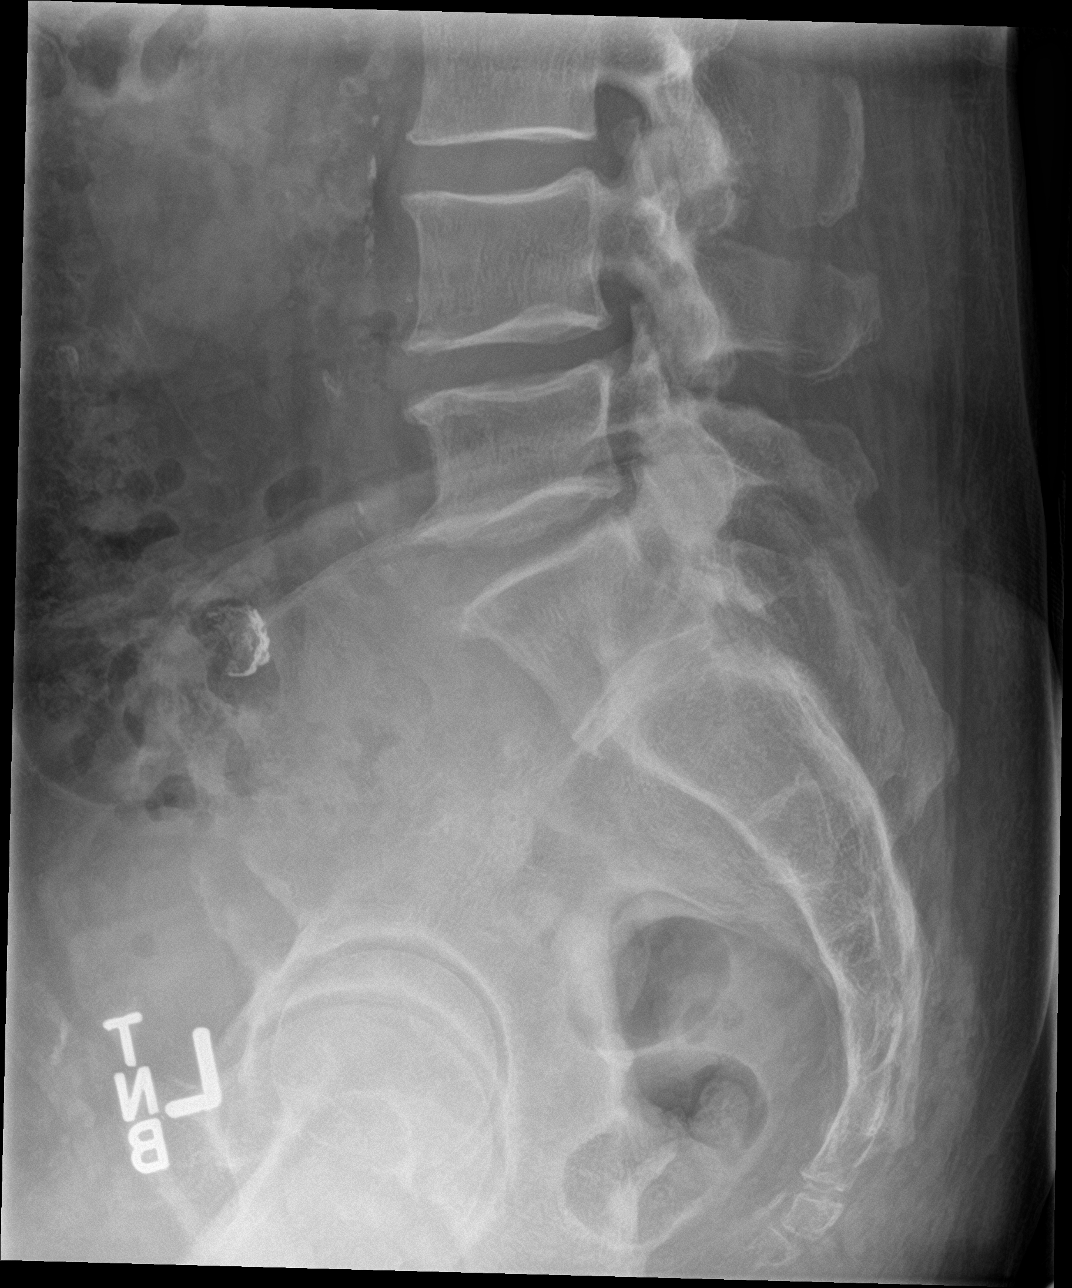

[5 of 5 positions shown; findings below may reference images not displayed]

FINDINGS: Normal lumbar segmentation. Stable vertebral height and alignment.
Stable lumbar disc spaces. No pars fracture. Mild to moderate lumbar
facet hypertrophy, maximal at L3-L4. Sacral ala and SI joints appear
within normal limits. Calcified aortic and iliac artery
atherosclerosis. Dense probable retained oral contrast at the tip of
the cecum. Visible lower thoracic levels appear intact.
IMPRESSION: 1.  No acute osseous abnormality identified in the lumbar spine
2. Dominant degenerative finding chronic lumbar facet degeneration,
maximal at L3-L4.
3. Calcified aortic atherosclerosis.

## 2016-08-18 ENCOUNTER — Ambulatory Visit (INDEPENDENT_AMBULATORY_CARE_PROVIDER_SITE_OTHER): Payer: Medicare Other | Admitting: Family Medicine

## 2016-08-18 VITALS — BP 118/52 | HR 87 | Temp 98.2°F | Resp 14

## 2016-08-18 DIAGNOSIS — Z993 Dependence on wheelchair: Secondary | ICD-10-CM | POA: Insufficient documentation

## 2016-08-18 DIAGNOSIS — I10 Essential (primary) hypertension: Secondary | ICD-10-CM | POA: Diagnosis not present

## 2016-08-18 DIAGNOSIS — F0150 Vascular dementia without behavioral disturbance: Secondary | ICD-10-CM

## 2016-08-18 DIAGNOSIS — M48062 Spinal stenosis, lumbar region with neurogenic claudication: Secondary | ICD-10-CM

## 2016-08-18 DIAGNOSIS — N3946 Mixed incontinence: Secondary | ICD-10-CM | POA: Diagnosis not present

## 2016-08-18 DIAGNOSIS — F0390 Unspecified dementia without behavioral disturbance: Secondary | ICD-10-CM | POA: Insufficient documentation

## 2016-08-18 DIAGNOSIS — F17200 Nicotine dependence, unspecified, uncomplicated: Secondary | ICD-10-CM | POA: Diagnosis not present

## 2016-08-18 DIAGNOSIS — M48061 Spinal stenosis, lumbar region without neurogenic claudication: Secondary | ICD-10-CM | POA: Insufficient documentation

## 2016-08-18 DIAGNOSIS — Z79899 Other long term (current) drug therapy: Secondary | ICD-10-CM | POA: Diagnosis not present

## 2016-08-18 DIAGNOSIS — R32 Unspecified urinary incontinence: Secondary | ICD-10-CM | POA: Insufficient documentation

## 2016-08-18 LAB — CBC WITH DIFFERENTIAL/PLATELET
BASOS ABS: 69 {cells}/uL (ref 0–200)
Basophils Relative: 1 %
EOS PCT: 11 %
Eosinophils Absolute: 759 cells/uL — ABNORMAL HIGH (ref 15–500)
HCT: 39.5 % (ref 38.5–50.0)
Hemoglobin: 12.9 g/dL — ABNORMAL LOW (ref 13.0–17.0)
Lymphocytes Relative: 25 %
Lymphs Abs: 1725 cells/uL (ref 850–3900)
MCH: 30.6 pg (ref 27.0–33.0)
MCHC: 32.7 g/dL (ref 32.0–36.0)
MCV: 93.8 fL (ref 80.0–100.0)
MPV: 9.9 fL (ref 7.5–12.5)
Monocytes Absolute: 621 cells/uL (ref 200–950)
Monocytes Relative: 9 %
NEUTROS PCT: 54 %
Neutro Abs: 3726 cells/uL (ref 1500–7800)
PLATELETS: 203 10*3/uL (ref 140–400)
RBC: 4.21 MIL/uL (ref 4.20–5.80)
RDW: 13.3 % (ref 11.0–15.0)
WBC: 6.9 10*3/uL (ref 3.8–10.8)

## 2016-08-18 NOTE — Assessment & Plan Note (Signed)
Chronic pain syndrome I will continue the tramadol for now.

## 2016-08-18 NOTE — Assessment & Plan Note (Signed)
I will obtain his records from his previous primary care provider I will also look especially his records. He is on a significant amount of medications which at this point even the family is not aware why he is on some of these pills. He has duplicate the things as well. He needs his medication was narrowed down with significant polypharmacy. He is wheelchair-bound and has chronic pain which is currently very difficult to control with I do not see this improving otherwise based on his history. I'm not sure why he is on metoclopramide why he is truly on the melatonin and not think is providing any benefit as far as his insomnia. Some of his medications could be changed to one pill such as his trazodone dose and the lorazepam doses he takes both at the same time. Wife is aware of the interaction of the vessel care with the Aricept however he gets benefit with regard to the urinary incontinence therefore they want to continue this. I'm not sure if he just has chronic constipation will be does have some type of neurogenic bowel problem but is unclear why he's only prescribed Amitiza once a day when he does take regularly twice a day.  There is significant asked medical history which I will review and try to make some adjustments to his regimen. Discussed this with the patient and his wife. I did obtain some fasting labs today.  Per wife immunizations are up-to-date.  He is a smoker about 5 cigarettes a day, he was counseled on tobacco cessation but he does not want to quit.

## 2016-08-18 NOTE — Patient Instructions (Signed)
Release of records- Dr. Karie Kirks Release of records- Dr. Trenton Gammon Neurosurgeon  We will call with lab results F/U 1st week October

## 2016-08-18 NOTE — Progress Notes (Signed)
Subjective:    Patient ID: [name], male    DOB: 1934-04-09, 82 y.o.   MRN: [id number]  Patient presents for New Patient (Initial Visit)   Previous PCP Dr. Karie Kirks , here to establish care, here with wife, she states he takes about 23 pills a da\y    Dr. [facility]-  Chronic pain into Right leg/ Spinal stenosis lumbar spine   - Takes Ultram for pain 100mg  BID, was on hydrocodone but it didn't help   also on naprosyn BID    History of CVA- wheelchair bound for past 5 years was seen by Dr. Jannifer Franklin  In past, on lisinopril for hypertension and ASA, no statin drug   He was going to [place] at [facility] they transitioned from walker to wheelchair, due to oingoing gait problems   Chronic pain- fron back, cervical spondylosis histor of cervical spine surgery , has spineal stenosis in lumbar spine. Unable to stand, uses lift chair to slide into recliner, and into the bed, uses urinal during the day  No history of CHF, heart diease, no history Diabetes , no lung problems   Chronic constipation- on Amitiz once a day in pill packs, but he has extras so he talkes twice a day, he has tried all other meds they have not helped,  has had colonoscopy 2014  MDD/Insomnia- takes Trazadone at bedtime ( 2 of the 50mg  at bedtime), taking ativan 1mg  at bedtime but written as 2 (0.5mg  tablets) , also on metatonin   Dementia- on aricept , he does shake with intetional movements , needs help with dressing and bathing , has AIDE comes Monday through [place] 8-1pm  ( [place] [facility] )  Taking Reglan 10mg  at bedtime unknown reason , does have GERD on omeprazole and has seen Dr. Janace Hoard ENT in past, as well as GI- Dr. [name]   Urinary incontience- on vesicare - takes in the morning , doe snot wear depends   Medications from RX care   Immunizations UTD per wife   Smokes- 5 cig/day   Review Of Systems:  GEN- denies fatigue, fever, weight loss,weakness, recent illness HEENT- denies eye drainage, change in  vision, nasal discharge, CVS- denies chest pain, palpitations RESP- denies SOB, cough, wheeze ABD- denies N/V, change in stools, abd pain GU- denies dysuria, hematuria, dribbling, incontinence MSK- denies joint pain, muscle aches, injury Neuro- denies headache, dizziness, syncope, seizure activity       Objective:    BP (!) 118/52   Pulse 87   Temp 98.2 F (36.8 C) (Oral)   Resp 14   SpO2 96%  GEN- NAD, alert and oriented x3, in wheelchair  HEENT- PERRL, EOMI,lazy left eye  non injected sclera, pink conjunctiva, MMM, oropharynx clear Neck- Supple  CVS- RRR, no murmur RESP-CTAB ABD-NABS,soft,NT,ND EXT- No edema Pulses- Radial  2+        Assessment & Plan:      Problem List Items Addressed This Visit      Unprioritized   SMOKER - Primary   Wheelchair bound   Urinary incontinence   Polypharmacy   Lumbar spinal stenosis    Chronic pain syndrome I will continue the tramadol for now.      Essential hypertension   Relevant Orders   CBC with Differential/Platelet   Comprehensive metabolic panel   Lipid panel   Dementia    I will obtain his records from his previous primary care provider I will also look especially his records. He is on  a significant amount of medications which at this point even the family is not aware why he is on some of these pills. He has duplicate the things as well. He needs his medication was narrowed down with significant polypharmacy. He is wheelchair-bound and has chronic pain which is currently very difficult to control with I do not see this improving otherwise based on his history. I'm not sure why he is on metoclopramide why he is truly on the melatonin and not think is providing any benefit as far as his insomnia. Some of his medications could be changed to one pill such as his trazodone dose and the lorazepam doses he takes both at the same time. Wife is aware of the interaction of the vessel care with the Aricept however he gets benefit  with regard to the urinary incontinence therefore they want to continue this. I'm not sure if he just has chronic constipation will be does have some type of neurogenic bowel problem but is unclear why he's only prescribed Amitiza once a day when he does take regularly twice a day.  There is significant asked medical history which I will review and try to make some adjustments to his regimen. Discussed this with the patient and his wife. I did obtain some fasting labs today.  Per wife immunizations are up-to-date.  He is a smoker about 5 cigarettes a day, he was counseled on tobacco cessation but he does not want to quit.         Note: This dictation was prepared with Dragon dictation along with smaller phrase technology. Any transcriptional errors that result from this process are unintentional.

## 2016-08-19 LAB — COMPREHENSIVE METABOLIC PANEL
ALBUMIN: 3.5 g/dL — AB (ref 3.6–5.1)
ALT: 6 U/L — ABNORMAL LOW (ref 9–46)
AST: 16 U/L (ref 10–35)
Alkaline Phosphatase: 69 U/L (ref 40–115)
BUN: 18 mg/dL (ref 7–25)
CHLORIDE: 106 mmol/L (ref 98–110)
CO2: 25 mmol/L (ref 20–32)
CREATININE: 0.99 mg/dL (ref 0.70–1.11)
Calcium: 9.3 mg/dL (ref 8.6–10.3)
Glucose, Bld: 113 mg/dL — ABNORMAL HIGH (ref 70–99)
POTASSIUM: 4.4 mmol/L (ref 3.5–5.3)
SODIUM: 140 mmol/L (ref 135–146)
Total Bilirubin: 0.4 mg/dL (ref 0.2–1.2)
Total Protein: 6.7 g/dL (ref 6.1–8.1)

## 2016-08-19 LAB — LIPID PANEL
CHOL/HDL RATIO: 2.8 ratio (ref <5.0)
Cholesterol: 108 mg/dL (ref <200)
HDL: 39 mg/dL — ABNORMAL LOW (ref >40)
LDL CALC: 54 mg/dL (ref <100)
TRIGLYCERIDES: 74 mg/dL (ref <150)
VLDL: 15 mg/dL (ref <30)

## 2016-08-27 ENCOUNTER — Telehealth: Payer: Self-pay | Admitting: Family Medicine

## 2016-08-27 NOTE — Telephone Encounter (Signed)
I have reviewed his records  I recommend the following changes to his medication and some to reduce the pill count  There are quite a few, I think we need to discuss in person, so they understand the changes.  Schedule an OV ASAP for medication changes.

## 2016-08-28 NOTE — Telephone Encounter (Signed)
Call placed to patient wife, Festus Holts.   Appointment scheduled.

## 2016-08-31 ENCOUNTER — Ambulatory Visit (INDEPENDENT_AMBULATORY_CARE_PROVIDER_SITE_OTHER): Payer: Medicare Other | Admitting: Family Medicine

## 2016-08-31 ENCOUNTER — Encounter: Payer: Self-pay | Admitting: Family Medicine

## 2016-08-31 VITALS — BP 144/72 | HR 78 | Temp 98.4°F | Resp 16

## 2016-08-31 DIAGNOSIS — K5900 Constipation, unspecified: Secondary | ICD-10-CM

## 2016-08-31 DIAGNOSIS — F321 Major depressive disorder, single episode, moderate: Secondary | ICD-10-CM | POA: Insufficient documentation

## 2016-08-31 DIAGNOSIS — Z79899 Other long term (current) drug therapy: Secondary | ICD-10-CM

## 2016-08-31 DIAGNOSIS — F5104 Psychophysiologic insomnia: Secondary | ICD-10-CM | POA: Diagnosis not present

## 2016-08-31 DIAGNOSIS — M48062 Spinal stenosis, lumbar region with neurogenic claudication: Secondary | ICD-10-CM

## 2016-08-31 MED ORDER — TRAZODONE HCL 100 MG PO TABS: 100.0000 mg | ORAL_TABLET | Freq: Every day | ORAL | 1 refills | Status: DC

## 2016-08-31 MED ORDER — LORAZEPAM 1 MG PO TABS: 1.0000 mg | ORAL_TABLET | Freq: Every day | ORAL | 3 refills | Status: DC

## 2016-08-31 MED ORDER — HYDROCODONE-ACETAMINOPHEN 5-325 MG PO TABS: 1.0000 | ORAL_TABLET | Freq: Three times a day (TID) | ORAL | 0 refills | Status: DC | PRN

## 2016-08-31 MED ORDER — LUBIPROSTONE 24 MCG PO CAPS: 24.0000 ug | ORAL_CAPSULE | Freq: Two times a day (BID) | ORAL | 2 refills | Status: DC

## 2016-08-31 MED ORDER — METOCLOPRAMIDE HCL 5 MG PO TABS: 5.0000 mg | ORAL_TABLET | Freq: Every day | ORAL | 1 refills | Status: DC

## 2016-08-31 NOTE — Assessment & Plan Note (Signed)
D/c melatonin Continue trazodone 100mg  and ativan 1mg 

## 2016-08-31 NOTE — Progress Notes (Signed)
   Subjective:    Patient ID: [name], male    DOB: April 30, 1934, 82 y.o.   MRN: [id number]  Patient presents for Follow-up (labs/ medication changes) Patient here with his wife for interim visit to discuss his medications. At the last visit I obtain records from his previous primary care provider as well as his neurologist and neurosurgeon. He has extensive past medical history. His medications were reviewed in detail  Changes below discussed:  Chronic insomnia currently on melatonin 3 mg this was started a few years ago, trazodone 100 mg, lorazepam 1 mg, I recommend that we discontinue the melatonin and continue the trazodone but changed to 100mg  tablet  and lorazepam will be changed to 1 mg tablet  Chronic pain he has significant spinal stenosis degenerative disc wheelchair bound the tramadol is not helping and he has been taking up to 2 tablets 4 times a day, as well as hydrocodone. For arthritis component he has been on Naprosyn 500 mg twice a day Recommended we discontinue the tramadol and use hydrocodone 5-325mg  3 times a day as  needed . Recommend discontinuing the Naprosyn in the setting of his gastroparesis and acid reflux he also has topical Voltaren gel  Major depression currently on citalopram will keep this at 20 mg  Gastroparesis continue the Reglan 10 mg at bedtime acid reflux we'll continue the omeprazole 20 mg. They were not aware of the gastroparesis diagnosis  Dementia he is on Aricept prescribed by neurology we'll continue this they were aware that he is also on vest to care for incontinence but this is affecting his quality of life the family would like to keep the best care.  Constipation records state  lactulose 60 mL at bedtime for constipation but he is not taking he is  on Amitiza 24 g once a day recommend changing his Amitiza to twice a day as he still has problems with his bowels   Review Of Systems:  GEN- denies fatigue, fever, weight loss,weakness, recent  illness HEENT- denies eye drainage, change in vision, nasal discharge, CVS- denies chest pain, palpitations RESP- denies SOB, cough, wheeze ABD- denies N/V, change in stools, abd pain GU- denies dysuria, hematuria, dribbling, incontinence MSK-+ joint pain, muscle aches, injury Neuro- denies headache, dizziness, syncope, seizure activity       Objective:    BP (!) 144/72   Pulse 78   Temp 98.4 F (36.9 C) (Oral)   Resp 16   SpO2 96%  GEN- NAD, alert and oriented x3, in wheelchair         Assessment & Plan:    approx 20 minutes spent on medication reconciliation/counseling of medications   Problem List Items Addressed This Visit      Unprioritized   Polypharmacy   Lumbar spinal stenosis    D/C Ultram, Naprosyn Increase norco 5-325mg  TID prn      Depression, major, single episode, moderate (HCC)    Continue celexa      Relevant Medications   traZODone (DESYREL) 100 MG tablet   LORazepam (ATIVAN) 1 MG tablet   Constipation - Primary    amitiza increased twice a day       Chronic insomnia    D/c melatonin Continue trazodone 100mg  and ativan 1mg           Note: This dictation was prepared with Dragon dictation along with smaller phrase technology. Any transcriptional errors that result from this process are unintentional.

## 2016-08-31 NOTE — Assessment & Plan Note (Signed)
amitiza increased twice a day

## 2016-08-31 NOTE — Assessment & Plan Note (Signed)
D/C Ultram, Naprosyn Increase norco 5-325mg  TID prn

## 2016-08-31 NOTE — Patient Instructions (Addendum)
STOP TRAMADOL STOP Naprosyn Stop the Melatonin For Pain- Hydrocodone three times a day as needed  For constipation- Amitiza will be twice a day  For sleep- trazodone 100mg  and Ativan 1mg   For bladder- COntinue the Veiscare For memory- Continue the aricept For STOMACH- decreasing reglan to 5mg , continue Omeprazole  F/U as previous

## 2016-08-31 NOTE — Assessment & Plan Note (Signed)
Continue celexa

## 2016-10-02 ENCOUNTER — Encounter: Payer: Self-pay | Admitting: Family Medicine

## 2016-10-02 ENCOUNTER — Ambulatory Visit (INDEPENDENT_AMBULATORY_CARE_PROVIDER_SITE_OTHER): Payer: Medicare Other | Admitting: Family Medicine

## 2016-10-02 VITALS — BP 130/62 | HR 66 | Temp 98.1°F | Resp 14

## 2016-10-02 DIAGNOSIS — K5900 Constipation, unspecified: Secondary | ICD-10-CM | POA: Diagnosis not present

## 2016-10-02 DIAGNOSIS — F5104 Psychophysiologic insomnia: Secondary | ICD-10-CM | POA: Diagnosis not present

## 2016-10-02 DIAGNOSIS — Z23 Encounter for immunization: Secondary | ICD-10-CM

## 2016-10-02 DIAGNOSIS — G8929 Other chronic pain: Secondary | ICD-10-CM | POA: Insufficient documentation

## 2016-10-02 DIAGNOSIS — I10 Essential (primary) hypertension: Secondary | ICD-10-CM | POA: Diagnosis not present

## 2016-10-02 DIAGNOSIS — G894 Chronic pain syndrome: Secondary | ICD-10-CM

## 2016-10-02 MED ORDER — HYDROCODONE-ACETAMINOPHEN 5-325 MG PO TABS: 1.0000 | ORAL_TABLET | Freq: Three times a day (TID) | ORAL | 0 refills | Status: DC | PRN

## 2016-10-02 NOTE — Patient Instructions (Addendum)
The Reglan will be discontined  I will take the ASA out of his pillpack- you can just buy Flu shot  F/u 4 MONTHS

## 2016-10-02 NOTE — Progress Notes (Signed)
   Subjective:    Patient ID: [name], male    DOB: 25-Nov-1934, 82 y.o.   MRN: [id number]  Patient presents for Follow-up and Flu Vaccine She had a follow-up his medications. He was seen a month ago at that time we made some changes to his medications to decrease polypharmacy. He states that he feels better with taking less pills. His pain is also better control with the hydrocodone compared to the tramadol.  Chronic constipation he is doing well with the Amitiza twice a day dosing  Chronic insomnia and he continues to have some difficulty sleeping states some nights he is good at the night he stays awake. He is on trazodone 100 mg and lorazepam 1 mg.  History of this questionable gastroparesis which she states she never was aware of. I decreased his Reglan down to 5 mg at bedtime he states that his stomach is fine and he is eating well he is also on omeprazole.   Due  for flu shot today Wife it also like [place] to take the aspirin out of the bubble pack she can get it for free  nO NEW COncerns   Review Of Systems:  GEN- denies fatigue, fever, weight loss,weakness, recent illness HEENT- denies eye drainage, change in vision, nasal discharge, CVS- denies chest pain, palpitations RESP- denies SOB, cough, wheeze ABD- denies N/V, change in stools, abd pain GU- denies dysuria, hematuria, dribbling, incontinence MSK- denies joint pain, muscle aches, injury Neuro- denies headache, dizziness, syncope, seizure activity       Objective:    BP 130/62   Pulse 66   Temp 98.1 F (36.7 C) (Oral)   Resp 14   SpO2 98%  GEN- NAD, alert and oriented x3,sitting in wheelchair  CVS- RRR, no murmur RESP-CTAB ABD-NABS,soft,NT,ND EXT- No edema Pulses- Radial 2+        Assessment & Plan:      Problem List Items Addressed This Visit      Unprioritized   Essential hypertension    Blood pressure control medication medication      Constipation - Primary    Improve with the medication  adjustments. I'm been actually discontinue the Reglan and see how he does. He is not aware why he is on the medication to begin with. Seems he has not had any digestive issues.      Chronic pain    Continue the hydrocodone      Relevant Medications   HYDROcodone-acetaminophen (NORCO/VICODIN) 5-325 MG tablet   Chronic insomnia    Continue that trazodone in the lorazepam at bedtime I think this is the safest combination for him.       Other Visit Diagnoses    Needs flu shot       Relevant Orders   Flu Vaccine QUAD 36+ mos IM (Completed)      Note: This dictation was prepared with Dragon dictation along with smaller phrase technology. Any transcriptional errors that result from this process are unintentional.

## 2016-10-02 NOTE — Assessment & Plan Note (Signed)
Continue the hydrocodone

## 2016-10-02 NOTE — Assessment & Plan Note (Signed)
Blood pressure control medication medication

## 2016-10-02 NOTE — Assessment & Plan Note (Signed)
Continue that trazodone in the lorazepam at bedtime I think this is the safest combination for him.

## 2016-10-02 NOTE — Assessment & Plan Note (Signed)
Improve with the medication adjustments. I'm been actually discontinue the Reglan and see how he does. He is not aware why he is on the medication to begin with. Seems he has not had any digestive issues.

## 2016-10-04 ENCOUNTER — Ambulatory Visit: Payer: Medicare Other | Admitting: Family Medicine

## 2016-10-24 DIAGNOSIS — J449 Chronic obstructive pulmonary disease, unspecified: Secondary | ICD-10-CM | POA: Diagnosis not present

## 2016-10-24 DIAGNOSIS — I10 Essential (primary) hypertension: Secondary | ICD-10-CM | POA: Diagnosis not present

## 2016-10-24 DIAGNOSIS — M4800 Spinal stenosis, site unspecified: Secondary | ICD-10-CM | POA: Diagnosis not present

## 2016-11-07 ENCOUNTER — Telehealth: Payer: Self-pay | Admitting: Family Medicine

## 2016-11-07 NOTE — Telephone Encounter (Signed)
Call placed to patient.   States that he is having increased R hip pain. States that Hydrocodone 5/325mg  is not effective anymore.   Advised that he has to be seen in office to increase medications. States that he will wait until his appointment on 11/22/2016.

## 2016-11-07 NOTE — Telephone Encounter (Signed)
CB# [phone] Patient is requesting something called into [facility] in [place] for his hip pain. I offered him an appointment since he states he hasn't seen her for this issue but he states he has one on the 21st.

## 2016-11-14 ENCOUNTER — Other Ambulatory Visit: Payer: Self-pay | Admitting: Family Medicine

## 2016-11-14 ENCOUNTER — Other Ambulatory Visit: Payer: Self-pay

## 2016-11-14 MED ORDER — HYDROCODONE-ACETAMINOPHEN 5-325 MG PO TABS: 1.0000 | ORAL_TABLET | Freq: Three times a day (TID) | ORAL | 0 refills | Status: DC | PRN

## 2016-11-14 NOTE — Telephone Encounter (Signed)
[name] aware rx can be picked up on 11/14

## 2016-11-14 NOTE — Telephone Encounter (Signed)
Last OV 10/02/2016 Last refill  10/01/02018 Ok to refill?  Drug registry printed

## 2016-11-14 NOTE — Telephone Encounter (Signed)
Okay to refill Drug registry reviewed

## 2016-11-16 ENCOUNTER — Other Ambulatory Visit: Payer: Self-pay | Admitting: *Deleted

## 2016-11-16 MED ORDER — VITAMIN D 1000 UNITS PO TABS: 1000.0000 [IU] | ORAL_TABLET | Freq: Every day | ORAL | 0 refills | Status: DC

## 2016-11-20 ENCOUNTER — Other Ambulatory Visit: Payer: Self-pay | Admitting: Family Medicine

## 2016-11-20 NOTE — Telephone Encounter (Signed)
okay

## 2016-11-20 NOTE — Telephone Encounter (Signed)
Ok to refill??  Last office visit 10/02/2016.  Last refill 08/31/2016, #3 refills.

## 2016-11-21 NOTE — Telephone Encounter (Signed)
Medication called to pharmacy. 

## 2016-11-24 DIAGNOSIS — M4800 Spinal stenosis, site unspecified: Secondary | ICD-10-CM | POA: Diagnosis not present

## 2016-11-24 DIAGNOSIS — J449 Chronic obstructive pulmonary disease, unspecified: Secondary | ICD-10-CM | POA: Diagnosis not present

## 2016-11-24 DIAGNOSIS — I10 Essential (primary) hypertension: Secondary | ICD-10-CM | POA: Diagnosis not present

## 2016-11-28 ENCOUNTER — Other Ambulatory Visit: Payer: Self-pay | Admitting: Family Medicine

## 2016-12-19 ENCOUNTER — Other Ambulatory Visit: Payer: Self-pay | Admitting: Family Medicine

## 2016-12-19 NOTE — Telephone Encounter (Signed)
Ok to refill??  Last office visit 10/02/2016.  Last refill 11/14/2016.

## 2016-12-19 NOTE — Telephone Encounter (Signed)
Okay to refill? 

## 2016-12-20 NOTE — Telephone Encounter (Signed)
Prescription printed and patient made aware to come to office to pick up on 12/21/2016.

## 2016-12-24 DIAGNOSIS — J449 Chronic obstructive pulmonary disease, unspecified: Secondary | ICD-10-CM | POA: Diagnosis not present

## 2016-12-24 DIAGNOSIS — I10 Essential (primary) hypertension: Secondary | ICD-10-CM | POA: Diagnosis not present

## 2016-12-24 DIAGNOSIS — M4800 Spinal stenosis, site unspecified: Secondary | ICD-10-CM | POA: Diagnosis not present

## 2016-12-27 DIAGNOSIS — H4720 Unspecified optic atrophy: Secondary | ICD-10-CM | POA: Diagnosis not present

## 2016-12-27 DIAGNOSIS — H18413 Arcus senilis, bilateral: Secondary | ICD-10-CM | POA: Diagnosis not present

## 2016-12-27 DIAGNOSIS — H40023 Open angle with borderline findings, high risk, bilateral: Secondary | ICD-10-CM | POA: Diagnosis not present

## 2016-12-27 DIAGNOSIS — H3589 Other specified retinal disorders: Secondary | ICD-10-CM | POA: Diagnosis not present

## 2016-12-27 DIAGNOSIS — H2511 Age-related nuclear cataract, right eye: Secondary | ICD-10-CM | POA: Diagnosis not present

## 2017-01-04 ENCOUNTER — Telehealth: Payer: Self-pay | Admitting: Family Medicine

## 2017-01-04 DIAGNOSIS — I739 Peripheral vascular disease, unspecified: Secondary | ICD-10-CM | POA: Diagnosis not present

## 2017-01-04 DIAGNOSIS — L110 Acquired keratosis follicularis: Secondary | ICD-10-CM | POA: Diagnosis not present

## 2017-01-04 DIAGNOSIS — B351 Tinea unguium: Secondary | ICD-10-CM | POA: Diagnosis not present

## 2017-01-04 MED ORDER — LUBIPROSTONE 24 MCG PO CAPS: 24.0000 ug | ORAL_CAPSULE | Freq: Every day | ORAL | 2 refills | Status: DC

## 2017-01-04 NOTE — Telephone Encounter (Signed)
Patients wife calling to see if dr Buelah Manis can let the mail order pharmacy know that he does not need any more amitiza, he has enough to last him all year

## 2017-01-04 NOTE — Telephone Encounter (Signed)
Call placed to patient to inquire.   States that he only takes Amitiza QD and QD PRN if bowels did not move fully. States that he has about 5 month supply.   [facility] made aware.

## 2017-01-06 ENCOUNTER — Emergency Department (HOSPITAL_COMMUNITY): Payer: Medicare Other

## 2017-01-06 ENCOUNTER — Emergency Department (HOSPITAL_COMMUNITY)
Admission: EM | Admit: 2017-01-06 | Discharge: 2017-01-06 | Disposition: A | Discharge status: Home / Self Care | Payer: Medicare Other | Attending: Emergency Medicine | Admitting: Emergency Medicine

## 2017-01-06 ENCOUNTER — Other Ambulatory Visit: Payer: Self-pay

## 2017-01-06 ENCOUNTER — Encounter (HOSPITAL_COMMUNITY): Payer: Self-pay

## 2017-01-06 DIAGNOSIS — Z8673 Personal history of transient ischemic attack (TIA), and cerebral infarction without residual deficits: Secondary | ICD-10-CM | POA: Insufficient documentation

## 2017-01-06 DIAGNOSIS — J189 Pneumonia, unspecified organism: Secondary | ICD-10-CM

## 2017-01-06 DIAGNOSIS — R0981 Nasal congestion: Secondary | ICD-10-CM | POA: Diagnosis not present

## 2017-01-06 DIAGNOSIS — J181 Lobar pneumonia, unspecified organism: Secondary | ICD-10-CM | POA: Diagnosis not present

## 2017-01-06 DIAGNOSIS — Z7982 Long term (current) use of aspirin: Secondary | ICD-10-CM | POA: Diagnosis not present

## 2017-01-06 DIAGNOSIS — Z79899 Other long term (current) drug therapy: Secondary | ICD-10-CM | POA: Insufficient documentation

## 2017-01-06 DIAGNOSIS — I119 Hypertensive heart disease without heart failure: Secondary | ICD-10-CM | POA: Insufficient documentation

## 2017-01-06 DIAGNOSIS — Z87891 Personal history of nicotine dependence: Secondary | ICD-10-CM | POA: Diagnosis not present

## 2017-01-06 DIAGNOSIS — I2510 Atherosclerotic heart disease of native coronary artery without angina pectoris: Secondary | ICD-10-CM | POA: Insufficient documentation

## 2017-01-06 DIAGNOSIS — R05 Cough: Secondary | ICD-10-CM | POA: Diagnosis not present

## 2017-01-06 MED ORDER — HYDROCODONE-HOMATROPINE 5-1.5 MG/5ML PO SYRP: 5.0000 mL | ORAL_SOLUTION | Freq: Four times a day (QID) | ORAL | 0 refills | Status: DC | PRN

## 2017-01-06 MED ORDER — AMOXICILLIN 250 MG PO CAPS
1000.0000 mg | ORAL_CAPSULE | Freq: Once | ORAL | Status: AC
  Administered 2017-01-06: 1000 mg via ORAL
  Filled 2017-01-06: qty 4

## 2017-01-06 MED ORDER — ONDANSETRON HCL 4 MG PO TABS
4.0000 mg | ORAL_TABLET | Freq: Once | ORAL | Status: AC
  Administered 2017-01-06: 4 mg via ORAL
  Filled 2017-01-06: qty 1

## 2017-01-06 MED ORDER — ALBUTEROL SULFATE (2.5 MG/3ML) 0.083% IN NEBU
2.5000 mg | INHALATION_SOLUTION | Freq: Once | RESPIRATORY_TRACT | Status: AC
  Administered 2017-01-06: 2.5 mg via RESPIRATORY_TRACT
  Filled 2017-01-06: qty 3

## 2017-01-06 MED ORDER — AZITHROMYCIN 250 MG PO TABS: ORAL_TABLET | ORAL | 0 refills | Status: DC

## 2017-01-06 MED ORDER — ALBUTEROL SULFATE HFA 108 (90 BASE) MCG/ACT IN AERS
2.0000 | INHALATION_SPRAY | Freq: Once | RESPIRATORY_TRACT | Status: AC
  Administered 2017-01-06: 2 via RESPIRATORY_TRACT
  Filled 2017-01-06: qty 6.7

## 2017-01-06 MED ORDER — HYDROCOD POLST-CPM POLST ER 10-8 MG/5ML PO SUER
5.0000 mL | Freq: Once | ORAL | Status: AC
  Administered 2017-01-06: 5 mL via ORAL
  Filled 2017-01-06: qty 5

## 2017-01-06 MED ORDER — AZITHROMYCIN 250 MG PO TABS
1000.0000 mg | ORAL_TABLET | Freq: Once | ORAL | Status: AC
  Administered 2017-01-06: 1000 mg via ORAL
  Filled 2017-01-06: qty 4

## 2017-01-06 MED ORDER — METHYLPREDNISOLONE SODIUM SUCC 125 MG IJ SOLR
80.0000 mg | Freq: Once | INTRAMUSCULAR | Status: AC
  Administered 2017-01-06: 80 mg via INTRAVENOUS
  Filled 2017-01-06: qty 2

## 2017-01-06 MED ORDER — CEFDINIR 300 MG PO CAPS
300.0000 mg | ORAL_CAPSULE | Freq: Two times a day (BID) | ORAL | 0 refills | Status: DC
Start: 2017-01-06 — End: 2017-02-02

## 2017-01-06 MED ORDER — IPRATROPIUM-ALBUTEROL 0.5-2.5 (3) MG/3ML IN SOLN
3.0000 mL | Freq: Once | RESPIRATORY_TRACT | Status: AC
  Administered 2017-01-06: 3 mL via RESPIRATORY_TRACT
  Filled 2017-01-06: qty 3

## 2017-01-06 NOTE — ED Triage Notes (Signed)
Patient reports of dry cough x 3-4 weeks. States the past week has been worse after outside of rain a few days ago. Reports of fever, did not take temperature. Also reports of quit smoking beginning of year. Denies shortness of breath or pain.

## 2017-01-06 NOTE — Discharge Instructions (Signed)
Your vital signs are within normal limits.  Your x-ray however shows right lung pneumonia.  Please increase fluids.  Please monitor your temperature closely.  Use Tylenol every 4 hours for temperature elevation or aching.  Please use Zithromax 1 daily until all taken.  Please use Omnicef 2 times daily with food.  Use albuterol 2 puffs every 4 hours, may use Hycodan for cough if needed.  This medication may cause drowsiness, please use it with caution.  Please see [name], or a member of their team at the beginning of next week for recheck of your pneumonia.  Please return to the emergency department if any difficulty with breathing, high fever that would not respond to Tylenol, or changes in your general condition.

## 2017-01-06 NOTE — ED Notes (Signed)
Pt given crackers and sprite.

## 2017-01-06 NOTE — ED Provider Notes (Signed)
[facility] EMERGENCY DEPARTMENT Provider Note   CSN: [id number] Arrival date & time: 01/06/17  4970     History   Chief Complaint Chief Complaint  Patient presents with  . Cough    HPI [name] is a 83 y.o. male.   Patient is an 83 year old male who presents to the emergency department with a complaint of cough.  The patient states this problem is been going off and on for 3-4 weeks.  He notices that the problem is getting progressively worse.  He was up most of the night because of cough and difficulty with his breathing.  He thought initially that this probably could be related to him stopping smoking.  Because he only stop smoking about 5 days ago.  He was also concerned as to whether or not this could be related to the increase amount of rain with changes in temperature recently.  He says he is not short of breath, but he has to cough some much that he has soreness in his muscles.  His wife convinced him to come to the emergency department for evaluation.      Past Medical History:  Diagnosis Date  . Abnormality of gait 08/01/2013  . Arthritis   . Blindness of left eye    Posttraumatic  . Carotid artery disease ([place])   . Colon polyp   . Constipation   . Diaphragmatic hernia   . Disc disease, degenerative, cervical   . Diverticulitis   . ED (erectile dysfunction)   . Gastroparesis   . GERD (gastroesophageal reflux disease)   . Glaucoma   . Heart disease   . Hyperlipidemia   . Hypertension   . Insomnia   . Memory difficulty 08/04/2014  . Nicotine dependence   . Numbness and tingling of right arm 08/04/2014  . Osteoarthritis   . Overactive bladder   . Radiculopathy   . Shortness of breath    with activity  . Sleep apnea    does not use CPAP.  Tested maybe 5- 10 years ago  . Stroke [facility])    Left side weakness.  . Unilateral inguinal hernia     Patient Active Problem List   Diagnosis Date Noted  . Chronic pain 10/02/2016  . Depression, major, single  episode, moderate ([place]) 08/31/2016  . Chronic insomnia 08/31/2016  . Lumbar spinal stenosis 08/18/2016  . Wheelchair bound 08/18/2016  . Dementia 08/18/2016  . Urinary incontinence 08/18/2016  . Polypharmacy 08/18/2016  . Memory difficulty 08/04/2014  . Numbness and tingling of right arm 08/04/2014  . Abnormality of gait 08/01/2013  . Anemia 12/05/2012  . Early satiety 12/03/2012  . History of colon polyps 12/03/2012  . Facial droop 10/12/2012  . Spinal stenosis of cervical region 07/01/2012  . GERD 05/27/2009  . DYSPHAGIA UNSPECIFIED 05/27/2009  . CHANGE IN BOWELS 05/27/2009  . SMOKER 09/09/2008  . Essential hypertension 09/09/2008  . CVA 09/09/2008  . Constipation 09/09/2008  . HIGH BLOOD PRESSURE 07/25/2006    Past Surgical History:  Procedure Laterality Date  . ANTERIOR CERVICAL DECOMP/DISCECTOMY FUSION N/A 07/01/2012   Procedure: ANTERIOR CERVICAL DECOMPRESSION/DISCECTOMY FUSION CERVICAL FIVE-SIX [name] REMOVAL CERVICAL SIX-SEVEN;  Surgeon: [name], MD;  Location: [place] [facility] ORS;  Service: Neurosurgery;  Laterality: N/A;  . CERVICAL FUSION    . COLONOSCOPY   08/04/2002     RMR: Normal rectum/Pancolonic diverticula/Colonic polyps as described above, biopsied and/or snared  . COLONOSCOPY  60/08/2009   RMR: normal rectum/left and right sided diverticula/multiple colonic  polyps. tubular adenomas. surveillance due 2014  . COLONOSCOPY WITH ESOPHAGOGASTRODUODENOSCOPY (EGD) N/A 12/18/2012   Dr. [name]:  Colonic diverticulosis. Multiple colonic polyps-hyperplastic polyps and tubular adenoma. Friable anal canal hemorrhoids. EGD with chronic atrophic gastritis  . ESOPHAGOGASTRODUODENOSCOPY  04/14/2002   AVW:[id number]' ring, status post dilation as described above/Hiatal hernia, focal antral erosions of uncertain clinical significance  . ESOPHAGOGASTRODUODENOSCOPY  06/09/2009   RMR: normal esophagus s/p dilator/small hiatal hernia otherwise normal  . HERNIA REPAIR Right     inguinal  . INGUINAL HERNIA REPAIR Left 04/15/2014   Procedure: LEFT INGUINAL HERNIORRHAPHY WITH MESH;  Surgeon: [name], MD;  Location: [facility];  Service: General;  Laterality: Left;  . INSERTION OF MESH Left 04/15/2014   Procedure: INSERTION OF MESH;  Surgeon: [name], MD;  Location: [facility];  Service: General;  Laterality: Left;       Home Medications    Prior to Admission medications   Medication Sig Start Date End Date Taking? Authorizing Provider  aspirin EC 81 MG tablet Take 81 mg by mouth daily.   Yes [provider]  cholecalciferol (VITAMIN D) 1000 units tablet Take 1 tablet (1,000 Units total) daily by mouth. 11/16/16  Yes [place], Modena Nunnery, MD  citalopram (CELEXA) 20 MG tablet Take 20 mg by mouth daily.   Yes [provider]  donepezil (ARICEPT) 10 MG tablet TAKE (1) TABLET BY MOUTH AT BEDTIME. 05/20/15  Yes Kathrynn Ducking, MD  HYDROcodone-acetaminophen (NORCO/VICODIN) 5-325 MG tablet TAKE 1 TABLET BY MOUTH THREE TIMES DAILY AS NEEDED FOR MODERATE PAIN 12/20/16  Yes [place], Modena Nunnery, MD  lisinopril (PRINIVIL,ZESTRIL) 10 MG tablet Take 10 mg by mouth daily.   Yes [provider]  LORazepam (ATIVAN) 1 MG tablet TAKE (1) TABLET BY MOUTH AT BEDTIME. 11/21/16  Yes [place], Modena Nunnery, MD  lubiprostone (AMITIZA) 24 MCG capsule Take 1 capsule (24 mcg total) by mouth daily with breakfast. MAY TAKE ADDITIONAL DOSE QD PRN. 01/04/17  Yes [place], Modena Nunnery, MD  Multiple Vitamin (MULTIVITAMIN WITH MINERALS) TABS Take 1 tablet by mouth daily.   Yes [provider]  nitroGLYCERIN (NITROSTAT) 0.4 MG SL tablet Place 0.4 mg under the tongue every 5 (five) minutes as needed for chest pain.   Yes [provider]  omeprazole (PRILOSEC) 20 MG capsule TAKE 1 CAPSULE BY MOUTH ONCE DAILY FOR RELFUX ESOPHAGITIS OR STOMACH ULCERS. 12/19/16  Yes [place], Modena Nunnery, MD  traZODone (DESYREL) 100 MG tablet Take 1 tablet (100 mg total) by mouth at bedtime.  Changes to bubble pack 08/31/16  Yes [place], Modena Nunnery, MD  VESICARE 5 MG tablet TAKE ONE TABLET ONCE DAILY FOR BLADDER. 12/19/16  Yes [place], Modena Nunnery, MD  fluticasone (FLONASE) 50 MCG/ACT nasal spray USE 1 OR 2 SPRAYS IN EACH NOSTRIL EVERY DAY FOR NASAL ALLERGIES. Patient not taking: Reported on 01/06/2017 11/28/16   Alycia Rossetti, MD    Family History Family History  Problem Relation Age of Onset  . Cancer Mother   . Dementia Father   . Colon cancer Neg Hx     Social History Social History   Tobacco Use  . Smoking status: Former Smoker    Packs/day: 0.50    Years: 60.00    Pack years: 30.00    Types: Cigarettes  . Smokeless tobacco: Never Used  Substance Use Topics  . Alcohol use: No  . Drug use: No     Allergies   Aspirin   Review of Systems Review  of Systems  Constitutional: Negative for activity change.       All ROS Neg except as noted in HPI  HENT: Positive for congestion. Negative for nosebleeds.   Eyes: Negative for photophobia and discharge.  Respiratory: Positive for cough. Negative for shortness of breath and wheezing.   Cardiovascular: Negative for chest pain and palpitations.  Gastrointestinal: Negative for abdominal pain and blood in stool.  Genitourinary: Negative for dysuria, frequency and hematuria.  Musculoskeletal: Negative for arthralgias, back pain and neck pain.  Skin: Negative.   Neurological: Negative for dizziness, seizures and speech difficulty.  Psychiatric/Behavioral: Negative for confusion and hallucinations.     Physical Exam Updated Vital Signs BP 114/65   Pulse 69   Temp 98.5 F (36.9 C) (Oral)   Resp 14   Ht 5\' 9"  (1.753 m)   Wt 77.1 kg (170 lb)   SpO2 100%   BMI 25.10 kg/m    Physical Exam  Constitutional: He is oriented to person, place, and time. He appears well-developed and well-nourished.  Non-toxic appearance.  HENT:  Head: Normocephalic.  Right Ear: Tympanic membrane and external ear normal.  Left Ear:  Tympanic membrane and external ear normal.  Eyes: EOM and lids are normal. Pupils are equal, round, and reactive to light.  Neck: Normal range of motion. Neck supple. Carotid bruit is not present.  Cardiovascular: Normal rate, regular rhythm, normal heart sounds, intact distal pulses and normal pulses.  Pulmonary/Chest: No accessory muscle usage. No apnea. No respiratory distress. He has wheezes. He has rhonchi.  Abdominal: Soft. Bowel sounds are normal. There is no tenderness. There is no guarding.  Musculoskeletal: Normal range of motion.  Lymphadenopathy:       Head (right side): No submandibular adenopathy present.       Head (left side): No submandibular adenopathy present.    He has no cervical adenopathy.  Neurological: He is alert and oriented to person, place, and time. He has normal strength. No cranial nerve deficit or sensory deficit.  Skin: Skin is warm and dry.  Psychiatric: He has a normal mood and affect. His speech is normal.  Nursing note and vitals reviewed.    ED Treatments / Results  Labs (all labs ordered are listed, but only abnormal results are displayed) Labs Reviewed - No data to display  EKG  EKG Interpretation None       Radiology Dg Chest 2 View  Result Date: 01/06/2017 CLINICAL DATA:  Cough for 3 weeks. EXAM: CHEST  2 VIEW COMPARISON:  06/16/2015 and prior chest radiographs FINDINGS: Cardiomegaly and COPD changes again noted. Probable right middle lobe airspace disease noted likely representing pneumonia. Chronic changes within the left lower lung noted. No pneumothorax or definite pleural effusion. Calcified left hilar lymph node again noted. IMPRESSION: Question right middle lobe airspace disease/ pneumonia. Consider radiographic follow-up resolution. Cardiomegaly and COPD. Electronically Signed   By: Margarette Canada M.D.   On: 01/06/2017 11:34    Procedures Procedures (including critical care time)  Medications Ordered in ED Medications    chlorpheniramine-HYDROcodone (TUSSIONEX) 10-8 MG/5ML suspension 5 mL (not administered)  azithromycin (ZITHROMAX) tablet 1,000 mg (not administered)  amoxicillin (AMOXIL) capsule 1,000 mg (not administered)  ondansetron (ZOFRAN) tablet 4 mg (not administered)  albuterol (PROVENTIL HFA;VENTOLIN HFA) 108 (90 Base) MCG/ACT inhaler 2 puff (not administered)  ipratropium-albuterol (DUONEB) 0.5-2.5 (3) MG/3ML nebulizer solution 3 mL (3 mLs Nebulization Given 01/06/17 1041)  albuterol (PROVENTIL) (2.5 MG/3ML) 0.083% nebulizer solution 2.5 mg (2.5 mg Nebulization Given 01/06/17  1102)  methylPREDNISolone sodium succinate (SOLU-MEDROL) 125 mg/2 mL injection 80 mg (80 mg Intravenous Given 01/06/17 1111)     Initial Impression / Assessment and Plan / ED Course  I have reviewed the triage vital signs and the nursing notes.  Pertinent labs & imaging results that were available during my care of the patient were reviewed by me and considered in my medical decision making (see chart for details).      Final Clinical Impressions(s) / ED Diagn  Patient's pulse oximetry was 92% upon admission to the emergency department.  This improved to 98% after O2 started. Albuterol neb tx given.  Chest x-ray obtained.  Questions right middle lobe pneumonia.  Patient treated with Zithromax and a gram of Amoxil.  Patient continues to have some problems with wheezing.  2 puffs of albuterol inhaler given.  Patient was also given IV Solu-Medrol.  Patient states he feels some better.  He feels that he can manage this pneumonia at home with the help of his wife.  His wife states she feels she can also help her manage it at home.  Patient does not want to be admitted to the hospital at this time.  Prescription for Zithromax and Omnicef given to the patient.  Patient also given medication to help with cough.  He will use 2 puffs of albuterol every 4 hours along with his antibiotics.  The patient is to follow-up with [name] at  the beginning of the week.  He is to return to the emergency department if that appointment can be arranged.  Patient and wife are in agreement with this plan.  Pulse oximetry at discharge is 96% on room air.   Final diagnoses:  Community acquired pneumonia of right middle lobe of lung [facility])    ED Discharge Orders        Ordered    azithromycin (ZITHROMAX) 250 MG tablet     01/06/17 1419    cefdinir (OMNICEF) 300 MG capsule  2 times daily     01/06/17 1419    HYDROcodone-homatropine (HYCODAN) 5-1.5 MG/5ML syrup  Every 6 hours PRN     01/06/17 1419      Lily Kocher, PA-C 01/06/17 1934  Nat Christen, MD 01/07/17 2246

## 2017-01-06 NOTE — ED Notes (Signed)
Pt transported to xray 

## 2017-01-10 ENCOUNTER — Ambulatory Visit (INDEPENDENT_AMBULATORY_CARE_PROVIDER_SITE_OTHER): Payer: Medicare Other | Admitting: Family Medicine

## 2017-01-10 ENCOUNTER — Encounter: Payer: Self-pay | Admitting: Family Medicine

## 2017-01-10 ENCOUNTER — Other Ambulatory Visit: Payer: Self-pay

## 2017-01-10 VITALS — BP 128/58 | HR 88 | Temp 99.7°F | Resp 16

## 2017-01-10 DIAGNOSIS — J189 Pneumonia, unspecified organism: Secondary | ICD-10-CM

## 2017-01-10 DIAGNOSIS — J181 Lobar pneumonia, unspecified organism: Secondary | ICD-10-CM

## 2017-01-10 DIAGNOSIS — E46 Unspecified protein-calorie malnutrition: Secondary | ICD-10-CM | POA: Insufficient documentation

## 2017-01-10 DIAGNOSIS — F17200 Nicotine dependence, unspecified, uncomplicated: Secondary | ICD-10-CM

## 2017-01-10 DIAGNOSIS — E440 Moderate protein-calorie malnutrition: Secondary | ICD-10-CM

## 2017-01-10 DIAGNOSIS — J449 Chronic obstructive pulmonary disease, unspecified: Secondary | ICD-10-CM

## 2017-01-10 MED ORDER — PREDNISONE 20 MG PO TABS: 40.0000 mg | ORAL_TABLET | Freq: Every day | ORAL | 0 refills | Status: DC

## 2017-01-10 MED ORDER — MIRTAZAPINE 15 MG PO TABS: 7.5000 mg | ORAL_TABLET | Freq: Every day | ORAL | 1 refills | Status: DC

## 2017-01-10 NOTE — Patient Instructions (Addendum)
Get CXR in 4 weeks and pulmonary function test  Use flonase Take prednisone Complete antibiotics Use every as scheduled for another 2 days, then as needed  F/U as previous

## 2017-01-10 NOTE — Progress Notes (Signed)
   Subjective:    Patient ID: [name], male    DOB: 02/05/1934, 83 y.o.   MRN: [id number]  Patient presents for ER F/U (PNA)  Patient here for ER follow-up.  Seen in the ER on January 5 secondary to cough congestion wheezing he was given IV Solu-Medrol nebulizer treatment.  Chest x-ray showed right middle lobe infiltrate he was treated with azithromycin and Omnicef which he is currently on.  He was also given Hycodan cough syrup. Viewed ER note as well as images.  His O2 sat was initially down to 92% he was given oxygen and above therapies and it improved to 100%.  Cough has improved some but he still has some tightness in his chest and wheezing.  He also states that he felt stuffy in the head.  He has completed azthromycin, using cough medicine TID as needed, using inhaler 4 times a day  He is a smoker, bu this home states no smoking on premesis   Has had decreased appetite- thjis has been going on for a while, drinking boost 2 cans a day once a day   Note xray also showed COPD changes, he has not had PFT  Review Of Systems:  GEN- denies fatigue, fever, weight loss,weakness, recent illness HEENT- denies eye drainage, change in vision, nasal discharge, CVS- denies chest pain, palpitations RESP- denies SOB, cough, wheeze ABD- denies N/V, change in stools, abd pain GU- denies dysuria, hematuria, dribbling, incontinence MSK- denies joint pain, muscle aches, injury Neuro- denies headache, dizziness, syncope, seizure activity       Objective:    BP (!) 128/58   Pulse 88   Temp 99.7 F (37.6 C) (Oral)   Resp 16   SpO2 94%  GEN- NAD, alert and oriented x3, in wheelchair  HEENT- PERRL, EOMI,lazy left eye  non injected sclera, pink conjunctiva, MMM, oropharynx clear, TM clear bilat, no effusion  Neck- Supple  CVS- RRR, no murmur RESP-scattered bilateral wheeze, mild rhonchi, normal WOB at rest  ABD-NABS,soft,NT,ND EXT- No edema Pulses- Radial  2+       Assessment & Plan:       Problem List Items Addressed This Visit      Unprioritized   SMOKER    Other Visit Diagnoses    Community acquired pneumonia of right middle lobe of lung ([place])    -  Primary   CAP, complete treatment, will adder prednisone for 5 days, continue inhalers scheduled for another 2 days, then prn. I think there is a component of COPD. use flonase for nasal symptoms congestion   Chronic obstructive pulmonary disease, unspecified COPD type ([place])       I think there is a component of COPD. with acute exacerbation, albuterol, prednisone, plan for CXR and PFT in 1 month    Relevant Medications   predniSONE (DELTASONE) 20 MG tablet   Protein malnutrition- unable to weigh as he is wheelchair bound, but concerned he is drinking boost with little solid foods. No pain with eating no vomiting, trial of remeron 7.5mg  once he complete the PNA medications    Note: This dictation was prepared with Dragon dictation along with smaller phrase technology. Any transcriptional errors that result from this process are unintentional.

## 2017-01-15 DIAGNOSIS — H2511 Age-related nuclear cataract, right eye: Secondary | ICD-10-CM | POA: Diagnosis not present

## 2017-01-15 DIAGNOSIS — H40031 Anatomical narrow angle, right eye: Secondary | ICD-10-CM | POA: Diagnosis not present

## 2017-01-15 DIAGNOSIS — H40013 Open angle with borderline findings, low risk, bilateral: Secondary | ICD-10-CM | POA: Diagnosis not present

## 2017-01-15 DIAGNOSIS — H2702 Aphakia, left eye: Secondary | ICD-10-CM | POA: Diagnosis not present

## 2017-01-22 ENCOUNTER — Other Ambulatory Visit: Payer: Self-pay | Admitting: Family Medicine

## 2017-01-22 NOTE — Telephone Encounter (Signed)
Ok to refill??  Last office visit 01/10/2017.  Last refill 11/21/2016, #1 refill.

## 2017-01-24 DIAGNOSIS — J449 Chronic obstructive pulmonary disease, unspecified: Secondary | ICD-10-CM | POA: Diagnosis not present

## 2017-01-25 ENCOUNTER — Ambulatory Visit (HOSPITAL_COMMUNITY)
Admission: RE | Admit: 2017-01-25 | Discharge: 2017-01-25 | Disposition: A | Discharge status: Home / Self Care | Payer: Medicare Other | Source: Ambulatory Visit | Attending: Family Medicine | Admitting: Family Medicine

## 2017-01-25 DIAGNOSIS — F17200 Nicotine dependence, unspecified, uncomplicated: Secondary | ICD-10-CM | POA: Insufficient documentation

## 2017-01-25 DIAGNOSIS — J449 Chronic obstructive pulmonary disease, unspecified: Secondary | ICD-10-CM | POA: Diagnosis not present

## 2017-01-25 LAB — PULMONARY FUNCTION TEST
DL/VA % PRED: 81 %
DL/VA: 3.69 ml/min/mmHg/L
DLCO UNC % PRED: 46 %
DLCO unc: 14.41 ml/min/mmHg
FEF 25-75 POST: 2.8 L/s
FEF 25-75 PRE: 1.82 L/s
FEF2575-%CHANGE-POST: 54 %
FEF2575-%PRED-POST: 144 %
FEF2575-%PRED-PRE: 93 %
FEV1-%Change-Post: 12 %
FEV1-%Pred-Post: 94 %
FEV1-%Pred-Pre: 83 %
FEV1-Post: 2.38 L
FEV1-Pre: 2.12 L
FEV1FVC-%Change-Post: 2 %
FEV1FVC-%PRED-PRE: 104 %
FEV6-%CHANGE-POST: 7 %
FEV6-%PRED-POST: 87 %
FEV6-%PRED-PRE: 81 %
FEV6-POST: 2.88 L
FEV6-Pre: 2.68 L
FEV6FVC-%CHANGE-POST: 0 %
FEV6FVC-%PRED-POST: 106 %
FEV6FVC-%Pred-Pre: 105 %
FVC-%CHANGE-POST: 10 %
FVC-%PRED-POST: 85 %
FVC-%PRED-PRE: 77 %
FVC-Post: 2.97 L
FVC-Pre: 2.69 L
POST FEV6/FVC RATIO: 100 %
PRE FEV1/FVC RATIO: 79 %
Post FEV1/FVC ratio: 80 %
Pre FEV6/FVC Ratio: 100 %
RV % PRED: 116 %
RV: 3.03 L
TLC % pred: 81 %
TLC: 5.59 L

## 2017-01-25 MED ORDER — ALBUTEROL SULFATE (2.5 MG/3ML) 0.083% IN NEBU
2.5000 mg | INHALATION_SOLUTION | Freq: Once | RESPIRATORY_TRACT | Status: AC
  Administered 2017-01-25: 2.5 mg via RESPIRATORY_TRACT

## 2017-02-01 ENCOUNTER — Encounter: Payer: Self-pay | Admitting: *Deleted

## 2017-02-02 ENCOUNTER — Encounter: Payer: Self-pay | Admitting: Family Medicine

## 2017-02-02 ENCOUNTER — Other Ambulatory Visit: Payer: Self-pay

## 2017-02-02 ENCOUNTER — Ambulatory Visit (INDEPENDENT_AMBULATORY_CARE_PROVIDER_SITE_OTHER): Payer: Medicare Other | Admitting: Family Medicine

## 2017-02-02 VITALS — BP 148/78 | HR 76 | Temp 98.0°F | Resp 16

## 2017-02-02 DIAGNOSIS — F17200 Nicotine dependence, unspecified, uncomplicated: Secondary | ICD-10-CM

## 2017-02-02 DIAGNOSIS — F321 Major depressive disorder, single episode, moderate: Secondary | ICD-10-CM | POA: Diagnosis not present

## 2017-02-02 DIAGNOSIS — G459 Transient cerebral ischemic attack, unspecified: Secondary | ICD-10-CM

## 2017-02-02 DIAGNOSIS — F0150 Vascular dementia without behavioral disturbance: Secondary | ICD-10-CM | POA: Diagnosis not present

## 2017-02-02 DIAGNOSIS — I10 Essential (primary) hypertension: Secondary | ICD-10-CM | POA: Diagnosis not present

## 2017-02-02 DIAGNOSIS — G894 Chronic pain syndrome: Secondary | ICD-10-CM

## 2017-02-02 DIAGNOSIS — Z23 Encounter for immunization: Secondary | ICD-10-CM

## 2017-02-02 DIAGNOSIS — M48062 Spinal stenosis, lumbar region with neurogenic claudication: Secondary | ICD-10-CM

## 2017-02-02 DIAGNOSIS — F5104 Psychophysiologic insomnia: Secondary | ICD-10-CM

## 2017-02-02 LAB — CBC WITH DIFFERENTIAL/PLATELET
BASOS ABS: 62 {cells}/uL (ref 0–200)
Basophils Relative: 1.2 %
EOS ABS: 395 {cells}/uL (ref 15–500)
Eosinophils Relative: 7.6 %
HEMATOCRIT: 39.1 % (ref 38.5–50.0)
Hemoglobin: 13.4 g/dL (ref 13.2–17.1)
LYMPHS ABS: 2210 {cells}/uL (ref 850–3900)
MCH: 30.5 pg (ref 27.0–33.0)
MCHC: 34.3 g/dL (ref 32.0–36.0)
MCV: 88.9 fL (ref 80.0–100.0)
MPV: 10 fL (ref 7.5–12.5)
Monocytes Relative: 8.5 %
NEUTROS PCT: 40.2 %
Neutro Abs: 2090 cells/uL (ref 1500–7800)
Platelets: 190 10*3/uL (ref 140–400)
RBC: 4.4 10*6/uL (ref 4.20–5.80)
RDW: 12.9 % (ref 11.0–15.0)
Total Lymphocyte: 42.5 %
WBC: 5.2 10*3/uL (ref 3.8–10.8)
WBCMIX: 442 {cells}/uL (ref 200–950)

## 2017-02-02 LAB — LIPID PANEL
CHOLESTEROL: 145 mg/dL (ref <200)
HDL: 50 mg/dL (ref >40)
LDL CHOLESTEROL (CALC): 76 mg/dL
Non-HDL Cholesterol (Calc): 95 mg/dL (calc) (ref <130)
Total CHOL/HDL Ratio: 2.9 (calc) (ref <5.0)
Triglycerides: 104 mg/dL (ref <150)

## 2017-02-02 LAB — COMPREHENSIVE METABOLIC PANEL
AG Ratio: 1.1 (calc) (ref 1.0–2.5)
ALBUMIN MSPROF: 3.6 g/dL (ref 3.6–5.1)
ALKALINE PHOSPHATASE (APISO): 70 U/L (ref 40–115)
ALT: 9 U/L (ref 9–46)
AST: 16 U/L (ref 10–35)
BILIRUBIN TOTAL: 0.3 mg/dL (ref 0.2–1.2)
BUN: 14 mg/dL (ref 7–25)
CALCIUM: 9.2 mg/dL (ref 8.6–10.3)
CO2: 27 mmol/L (ref 20–32)
Chloride: 106 mmol/L (ref 98–110)
Creat: 0.98 mg/dL (ref 0.70–1.11)
Globulin: 3.2 g/dL (calc) (ref 1.9–3.7)
Glucose, Bld: 89 mg/dL (ref 65–99)
POTASSIUM: 4.7 mmol/L (ref 3.5–5.3)
Sodium: 142 mmol/L (ref 135–146)
Total Protein: 6.8 g/dL (ref 6.1–8.1)

## 2017-02-02 NOTE — Assessment & Plan Note (Signed)
Counseled on tobacco cessation  Pneumonia vaccine given, no records to show his prevnar 13

## 2017-02-02 NOTE — Progress Notes (Signed)
Subjective:    Patient ID: [name], male    DOB: 03-11-1934, 83 y.o.   MRN: [id number]  Patient presents for Follow-up (is fasting)  Here to follow-up chronic medical problems.  He was seen a few weeks ago secondary to community-acquired pneumonia.  As he has had episodes of bronchitis before and noted some changes on x-rays and him for pulmonary function test a couple weeks ago that showed mild bronchitic changes.  No inhalers are needed.  He does however continue to smoke.  His chronic pain is controlled fairly well with the hydrocodone.  Chronic insomnia controlled with trazodone and Ativan.  He has underlying depression as well which he is also on Celexa.  Some cognitive decline with the diagnosis of dementia taking Aricept in general he has been fairly stable mentation wise    Wife here with patient , states after the last visit on Jan 10 woke couldn't use his write hand and was confused, he refused to go to hospital he could not use it and hand was weak for 2-3 days, now back ot baseline He has history of right sided weakness, numbness due to cervical disc disease, evaluated back in 2016   Due for pneumonia shot, unknown last one    Hearing decreased but he declines hearing test and hearing aides    Review Of Systems:  GEN- denies fatigue, fever, weight loss,weakness, recent illness HEENT- denies eye drainage, change in vision, nasal discharge, CVS- denies chest pain, palpitations RESP- denies SOB, cough, wheeze ABD- denies N/V, change in stools, abd pain GU- denies dysuria, hematuria, dribbling, incontinence MSK- +joint pain, muscle aches, injury Neuro- denies headache, dizziness, syncope, seizure activity       Objective:    BP (!) 148/78   Pulse 76   Temp 98 F (36.7 C) (Oral)   Resp 16   SpO2 97%  GEN- NAD, alert and oriented x3, in wheelchair  HEENT- PERRL, EOMI,lazy left eye  non injected sclera, pink conjunctiva, MMM, oropharynx clear, TM clear bilat, no  effusion  Neck- Supple , no bruit  CVS- RRR, no murmur RESP-CTAB NEURO- sitting in wheelchair, normal speech and mentation, CNII-XII in tact, able to move bilat UE, decreased grasp right hand compared to left, sensation grossly in tact, Decreased ROM Bilat Lower ext  ABD-NABS,soft,NT,ND EXT- No edema Pulses- Radial  2+        Assessment & Plan:      Problem List Items Addressed This Visit      Unprioritized   Lumbar spinal stenosis   SMOKER - Primary    Counseled on tobacco cessation  Pneumonia vaccine given, no records to show his prevnar 13      Essential hypertension    Controlled, fasting labs today   Concerned that he may have had another cerebrovascular event however he did recover fairly quickly.  He has history of multiple strokes based on his MRI pattern in reviewing his last neurologist note a few years ago. Increase ASA to 162mg  , he was on plavix in past per wife, believes they stopped due to recurrent falls No imaging neeed, he is not on statin drug unclear reasoning unless they were looking at overall quality of life and mortality He is wheelchair bound, elderly male      Depression, major, single episode, moderate ([place])    Controlled with ativan and celexa      Dementia    Continue aricept       Relevant Orders  Lipid panel   Chronic pain    Continue norco Pain controlled with this      Chronic insomnia    Controlled on trazodone       Other Visit Diagnoses    TIA (transient ischemic attack)       Relevant Orders   CBC with Differential/Platelet   Comprehensive metabolic panel   Lipid panel   Need for prophylactic vaccination against Streptococcus pneumoniae (pneumococcus)       Relevant Orders   Pneumococcal conjugate vaccine 13-valent IM (Completed)      Note: This dictation was prepared with Dragon dictation along with smaller phrase technology. Any transcriptional errors that result from this process are unintentional.

## 2017-02-02 NOTE — Assessment & Plan Note (Signed)
Continue aricept 

## 2017-02-02 NOTE — Assessment & Plan Note (Signed)
Controlled with ativan and celexa

## 2017-02-02 NOTE — Assessment & Plan Note (Signed)
Continue norco Pain controlled with this

## 2017-02-02 NOTE — Assessment & Plan Note (Signed)
Controlled on trazodone

## 2017-02-02 NOTE — Assessment & Plan Note (Addendum)
Controlled, fasting labs today   Concerned that he may have had another cerebrovascular event however he did recover fairly quickly.  He has history of multiple strokes based on his MRI pattern in reviewing his last neurologist note a few years ago. Increase ASA to 162mg  , he was on plavix in past per wife, believes they stopped due to recurrent falls No imaging neeed, he is not on statin drug unclear reasoning unless they were looking at overall quality of life and mortality He is wheelchair bound, elderly male

## 2017-02-02 NOTE — Patient Instructions (Addendum)
Increase to 2 baby aspirin  We will call with results  Pneumonia vaccine given  F/U 4 months

## 2017-02-14 ENCOUNTER — Other Ambulatory Visit: Payer: Self-pay | Admitting: Family Medicine

## 2017-02-14 ENCOUNTER — Other Ambulatory Visit: Payer: Self-pay | Admitting: *Deleted

## 2017-02-14 MED ORDER — ADULT MULTIVITAMIN W/MINERALS CH: 1.0000 | ORAL_TABLET | Freq: Every day | ORAL | 3 refills | Status: DC

## 2017-02-15 ENCOUNTER — Other Ambulatory Visit: Payer: Self-pay | Admitting: *Deleted

## 2017-02-15 MED ORDER — ADULT MULTIVITAMIN W/MINERALS CH: 1.0000 | ORAL_TABLET | Freq: Every day | ORAL | 3 refills | Status: DC

## 2017-02-22 ENCOUNTER — Other Ambulatory Visit: Payer: Self-pay | Admitting: Family Medicine

## 2017-02-22 NOTE — Telephone Encounter (Signed)
Ok to refill??  Last office visit 02/02/2017.   Last refill 12/20/2016.

## 2017-02-24 DIAGNOSIS — I10 Essential (primary) hypertension: Secondary | ICD-10-CM | POA: Diagnosis not present

## 2017-02-24 DIAGNOSIS — M4800 Spinal stenosis, site unspecified: Secondary | ICD-10-CM | POA: Diagnosis not present

## 2017-02-24 DIAGNOSIS — J449 Chronic obstructive pulmonary disease, unspecified: Secondary | ICD-10-CM | POA: Diagnosis not present

## 2017-02-27 ENCOUNTER — Encounter: Payer: Self-pay | Admitting: Family Medicine

## 2017-02-27 ENCOUNTER — Other Ambulatory Visit: Payer: Self-pay

## 2017-02-27 ENCOUNTER — Ambulatory Visit (INDEPENDENT_AMBULATORY_CARE_PROVIDER_SITE_OTHER): Payer: Medicare Other | Admitting: Family Medicine

## 2017-02-27 VITALS — BP 148/78 | HR 74 | Temp 98.1°F | Resp 16

## 2017-02-27 DIAGNOSIS — F17200 Nicotine dependence, unspecified, uncomplicated: Secondary | ICD-10-CM | POA: Diagnosis not present

## 2017-02-27 DIAGNOSIS — J209 Acute bronchitis, unspecified: Secondary | ICD-10-CM

## 2017-02-27 MED ORDER — PREDNISONE 10 MG PO TABS: ORAL_TABLET | ORAL | 0 refills | Status: DC

## 2017-02-27 MED ORDER — ALBUTEROL SULFATE HFA 108 (90 BASE) MCG/ACT IN AERS: 2.0000 | INHALATION_SPRAY | RESPIRATORY_TRACT | 0 refills | Status: DC | PRN

## 2017-02-27 MED ORDER — AMOXICILLIN-POT CLAVULANATE 875-125 MG PO TABS: 1.0000 | ORAL_TABLET | Freq: Two times a day (BID) | ORAL | 0 refills | Status: DC

## 2017-02-27 NOTE — Progress Notes (Signed)
   Subjective:    Patient ID: [name], male    DOB: [birth year], 83 y.o.   MRN: [id number]  Patient presents for Illness (x months- semi- productive cough with wheezing)  Pt here with cough with mild production, wheezing worse at night for past 3 weeks. Started after our last visit. No fever.  He does continue to smoke  PFT showed mild bronchitic changes   Review Of Systems:  GEN- denies fatigue, fever, weight loss,weakness, recent illness HEENT- denies eye drainage, change in vision, +nasal discharge, CVS- denies chest pain, palpitations RESP- +SOB,+ cough, +wheeze ABD- denies N/V, change in stools, abd pain GU- denies dysuria, hematuria, dribbling, incontinence MSK- denies joint pain, muscle aches, injury Neuro- + headache, dizziness, syncope, seizure activity       Objective:    BP (!) 148/78   Pulse 74   Temp 98.1 F (36.7 C) (Oral)   Resp 16   SpO2 94%  GEN- NAD, alert and oriented x3,sitting in wheelchair  HEENT- PERRL, EOMI, non injected sclera, pink conjunctiva, MMM, oropharynx clear,nares clear rhinorrhea  Neck- Supple, no LAD CVS- RRR, no murmur RESP-few scattered  Wheeze, mild rhonchi,normal WOB at rest  ABD-NABS,soft,NT,ND EXT- No edema Pulses- Radial  2+   Status post neb improved work of breathing wheeze pretty much resolved.     Assessment & Plan:      Problem List Items Addressed This Visit      Unprioritized   SMOKER    Other Visit Diagnoses    Acute bronchitis, unspecified organism    -  Primary   Take the steroids, antibiotics and use inhaler .  Advised to quit smoking.  If he does not improve he needs a chest x-ray as he did have pneumonia about 6 weeks      Note: This dictation was prepared with Dragon dictation along with smaller phrase technology. Any transcriptional errors that result from this process are unintentional.

## 2017-02-27 NOTE — Patient Instructions (Signed)
Take antibiotics as prescribed Take prednisone Use inhaler  Call if not improving

## 2017-02-28 MED ORDER — ALBUTEROL SULFATE (2.5 MG/3ML) 0.083% IN NEBU
2.5000 mg | INHALATION_SOLUTION | Freq: Once | RESPIRATORY_TRACT | Status: AC
  Administered 2017-02-27: 2.5 mg via RESPIRATORY_TRACT

## 2017-02-28 NOTE — Addendum Note (Signed)
Addended by: Sheral Flow on: 02/28/2017 11:49 AM   Modules accepted: Orders

## 2017-03-03 ENCOUNTER — Emergency Department (HOSPITAL_COMMUNITY): Payer: Medicare Other

## 2017-03-03 ENCOUNTER — Encounter (HOSPITAL_COMMUNITY): Payer: Self-pay

## 2017-03-03 ENCOUNTER — Inpatient Hospital Stay (HOSPITAL_COMMUNITY)
Admission: EM | Admit: 2017-03-03 | Discharge: 2017-03-06 | DRG: 100 | Disposition: A | Discharge status: Home / Self Care | Payer: Medicare Other | Attending: Internal Medicine | Admitting: Internal Medicine

## 2017-03-03 DIAGNOSIS — I2510 Atherosclerotic heart disease of native coronary artery without angina pectoris: Secondary | ICD-10-CM | POA: Diagnosis not present

## 2017-03-03 DIAGNOSIS — Z8673 Personal history of transient ischemic attack (TIA), and cerebral infarction without residual deficits: Secondary | ICD-10-CM | POA: Diagnosis not present

## 2017-03-03 DIAGNOSIS — H5462 Unqualified visual loss, left eye, normal vision right eye: Secondary | ICD-10-CM | POA: Diagnosis not present

## 2017-03-03 DIAGNOSIS — Z87891 Personal history of nicotine dependence: Secondary | ICD-10-CM

## 2017-03-03 DIAGNOSIS — Z86010 Personal history of colonic polyps: Secondary | ICD-10-CM

## 2017-03-03 DIAGNOSIS — Z66 Do not resuscitate: Secondary | ICD-10-CM | POA: Diagnosis present

## 2017-03-03 DIAGNOSIS — I248 Other forms of acute ischemic heart disease: Secondary | ICD-10-CM | POA: Diagnosis not present

## 2017-03-03 DIAGNOSIS — R7989 Other specified abnormal findings of blood chemistry: Secondary | ICD-10-CM | POA: Diagnosis not present

## 2017-03-03 DIAGNOSIS — F0390 Unspecified dementia without behavioral disturbance: Secondary | ICD-10-CM | POA: Diagnosis present

## 2017-03-03 DIAGNOSIS — G9340 Encephalopathy, unspecified: Secondary | ICD-10-CM | POA: Diagnosis present

## 2017-03-03 DIAGNOSIS — K219 Gastro-esophageal reflux disease without esophagitis: Secondary | ICD-10-CM | POA: Diagnosis present

## 2017-03-03 DIAGNOSIS — R778 Other specified abnormalities of plasma proteins: Secondary | ICD-10-CM

## 2017-03-03 DIAGNOSIS — I10 Essential (primary) hypertension: Secondary | ICD-10-CM | POA: Diagnosis present

## 2017-03-03 DIAGNOSIS — R1310 Dysphagia, unspecified: Secondary | ICD-10-CM | POA: Diagnosis present

## 2017-03-03 DIAGNOSIS — G4089 Other seizures: Secondary | ICD-10-CM | POA: Diagnosis present

## 2017-03-03 DIAGNOSIS — G8929 Other chronic pain: Secondary | ICD-10-CM | POA: Diagnosis present

## 2017-03-03 DIAGNOSIS — I77810 Thoracic aortic ectasia: Secondary | ICD-10-CM | POA: Diagnosis not present

## 2017-03-03 DIAGNOSIS — I69992 Facial weakness following unspecified cerebrovascular disease: Secondary | ICD-10-CM | POA: Diagnosis not present

## 2017-03-03 DIAGNOSIS — J90 Pleural effusion, not elsewhere classified: Secondary | ICD-10-CM | POA: Diagnosis not present

## 2017-03-03 DIAGNOSIS — E785 Hyperlipidemia, unspecified: Secondary | ICD-10-CM | POA: Diagnosis not present

## 2017-03-03 DIAGNOSIS — M4802 Spinal stenosis, cervical region: Secondary | ICD-10-CM | POA: Diagnosis present

## 2017-03-03 DIAGNOSIS — Z886 Allergy status to analgesic agent status: Secondary | ICD-10-CM

## 2017-03-03 DIAGNOSIS — Z79891 Long term (current) use of opiate analgesic: Secondary | ICD-10-CM

## 2017-03-03 DIAGNOSIS — Z791 Long term (current) use of non-steroidal anti-inflammatories (NSAID): Secondary | ICD-10-CM

## 2017-03-03 DIAGNOSIS — G4733 Obstructive sleep apnea (adult) (pediatric): Secondary | ICD-10-CM | POA: Diagnosis not present

## 2017-03-03 DIAGNOSIS — Z7982 Long term (current) use of aspirin: Secondary | ICD-10-CM

## 2017-03-03 DIAGNOSIS — I6789 Other cerebrovascular disease: Secondary | ICD-10-CM | POA: Diagnosis not present

## 2017-03-03 DIAGNOSIS — I6523 Occlusion and stenosis of bilateral carotid arteries: Secondary | ICD-10-CM | POA: Diagnosis not present

## 2017-03-03 DIAGNOSIS — I639 Cerebral infarction, unspecified: Secondary | ICD-10-CM | POA: Diagnosis present

## 2017-03-03 DIAGNOSIS — J0140 Acute pansinusitis, unspecified: Secondary | ICD-10-CM | POA: Diagnosis present

## 2017-03-03 DIAGNOSIS — R55 Syncope and collapse: Secondary | ICD-10-CM | POA: Diagnosis not present

## 2017-03-03 DIAGNOSIS — J9811 Atelectasis: Secondary | ICD-10-CM | POA: Diagnosis present

## 2017-03-03 DIAGNOSIS — R748 Abnormal levels of other serum enzymes: Secondary | ICD-10-CM | POA: Diagnosis not present

## 2017-03-03 DIAGNOSIS — H50111 Monocular exotropia, right eye: Secondary | ICD-10-CM | POA: Diagnosis present

## 2017-03-03 DIAGNOSIS — F0150 Vascular dementia without behavioral disturbance: Secondary | ICD-10-CM | POA: Diagnosis not present

## 2017-03-03 DIAGNOSIS — J189 Pneumonia, unspecified organism: Secondary | ICD-10-CM | POA: Diagnosis not present

## 2017-03-03 DIAGNOSIS — I6522 Occlusion and stenosis of left carotid artery: Secondary | ICD-10-CM | POA: Diagnosis present

## 2017-03-03 DIAGNOSIS — R918 Other nonspecific abnormal finding of lung field: Secondary | ICD-10-CM | POA: Diagnosis not present

## 2017-03-03 DIAGNOSIS — Z79899 Other long term (current) drug therapy: Secondary | ICD-10-CM

## 2017-03-03 DIAGNOSIS — Z981 Arthrodesis status: Secondary | ICD-10-CM

## 2017-03-03 DIAGNOSIS — R569 Unspecified convulsions: Secondary | ICD-10-CM

## 2017-03-03 DIAGNOSIS — Z8701 Personal history of pneumonia (recurrent): Secondary | ICD-10-CM | POA: Diagnosis not present

## 2017-03-03 DIAGNOSIS — R32 Unspecified urinary incontinence: Secondary | ICD-10-CM | POA: Diagnosis present

## 2017-03-03 DIAGNOSIS — Z993 Dependence on wheelchair: Secondary | ICD-10-CM

## 2017-03-03 DIAGNOSIS — I700 Atherosclerosis of aorta: Secondary | ICD-10-CM | POA: Diagnosis present

## 2017-03-03 DIAGNOSIS — R05 Cough: Secondary | ICD-10-CM | POA: Diagnosis not present

## 2017-03-03 DIAGNOSIS — I340 Nonrheumatic mitral (valve) insufficiency: Secondary | ICD-10-CM | POA: Diagnosis not present

## 2017-03-03 DIAGNOSIS — R4020 Unspecified coma: Secondary | ICD-10-CM | POA: Diagnosis not present

## 2017-03-03 DIAGNOSIS — F5104 Psychophysiologic insomnia: Secondary | ICD-10-CM | POA: Diagnosis present

## 2017-03-03 LAB — BASIC METABOLIC PANEL
ANION GAP: 9 (ref 5–15)
BUN: 18 mg/dL (ref 6–20)
CALCIUM: 9.1 mg/dL (ref 8.9–10.3)
CO2: 26 mmol/L (ref 22–32)
Chloride: 104 mmol/L (ref 101–111)
Creatinine, Ser: 0.85 mg/dL (ref 0.61–1.24)
GLUCOSE: 132 mg/dL — AB (ref 65–99)
Potassium: 4.7 mmol/L (ref 3.5–5.1)
Sodium: 139 mmol/L (ref 135–145)

## 2017-03-03 LAB — CBC WITH DIFFERENTIAL/PLATELET
Basophils Absolute: 0 10*3/uL (ref 0.0–0.1)
Basophils Relative: 0 %
EOS ABS: 0 10*3/uL (ref 0.0–0.7)
Eosinophils Relative: 0 %
HEMATOCRIT: 40 % (ref 39.0–52.0)
HEMOGLOBIN: 13 g/dL (ref 13.0–17.0)
LYMPHS ABS: 1.3 10*3/uL (ref 0.7–4.0)
LYMPHS PCT: 24 %
MCH: 30.6 pg (ref 26.0–34.0)
MCHC: 32.5 g/dL (ref 30.0–36.0)
MCV: 94.1 fL (ref 78.0–100.0)
MONOS PCT: 7 %
Monocytes Absolute: 0.4 10*3/uL (ref 0.1–1.0)
NEUTROS ABS: 3.7 10*3/uL (ref 1.7–7.7)
NEUTROS PCT: 69 %
Platelets: 226 10*3/uL (ref 150–400)
RBC: 4.25 MIL/uL (ref 4.22–5.81)
RDW: 14.3 % (ref 11.5–15.5)
WBC: 5.3 10*3/uL (ref 4.0–10.5)

## 2017-03-03 LAB — URINALYSIS, ROUTINE W REFLEX MICROSCOPIC
Bilirubin Urine: NEGATIVE
Glucose, UA: NEGATIVE mg/dL
HGB URINE DIPSTICK: NEGATIVE
Ketones, ur: NEGATIVE mg/dL
Leukocytes, UA: NEGATIVE
NITRITE: NEGATIVE
PROTEIN: NEGATIVE mg/dL
SPECIFIC GRAVITY, URINE: 1.012 (ref 1.005–1.030)
pH: 7 (ref 5.0–8.0)

## 2017-03-03 LAB — MAGNESIUM: MAGNESIUM: 2.1 mg/dL (ref 1.7–2.4)

## 2017-03-03 LAB — TROPONIN I: Troponin I: 0.1 ng/mL (ref <0.03)

## 2017-03-03 MED ORDER — HYDROCODONE-ACETAMINOPHEN 5-325 MG PO TABS: 1.0000 | ORAL_TABLET | Freq: Four times a day (QID) | ORAL | Status: DC | PRN

## 2017-03-03 MED ORDER — CITALOPRAM HYDROBROMIDE 20 MG PO TABS
20.0000 mg | ORAL_TABLET | Freq: Every day | ORAL | Status: DC
  Administered 2017-03-04 – 2017-03-06 (×3): 20 mg via ORAL
  Filled 2017-03-03 (×4): qty 1

## 2017-03-03 MED ORDER — ACETAMINOPHEN 650 MG RE SUPP: 650.0000 mg | Freq: Four times a day (QID) | RECTAL | Status: DC | PRN

## 2017-03-03 MED ORDER — LEVETIRACETAM 500 MG PO TABS
500.0000 mg | ORAL_TABLET | Freq: Two times a day (BID) | ORAL | Status: DC
  Administered 2017-03-04 – 2017-03-05 (×3): 500 mg via ORAL
  Filled 2017-03-03 (×3): qty 1

## 2017-03-03 MED ORDER — LORAZEPAM 2 MG/ML IJ SOLN
2.0000 mg | INTRAMUSCULAR | Status: DC | PRN
Start: 2017-03-03 — End: 2017-03-06

## 2017-03-03 MED ORDER — SODIUM CHLORIDE 0.9% FLUSH
3.0000 mL | Freq: Two times a day (BID) | INTRAVENOUS | Status: DC
  Administered 2017-03-04 – 2017-03-06 (×6): 3 mL via INTRAVENOUS

## 2017-03-03 MED ORDER — LORAZEPAM 1 MG PO TABS
1.0000 mg | ORAL_TABLET | Freq: Every day | ORAL | Status: DC
  Administered 2017-03-04 – 2017-03-05 (×3): 1 mg via ORAL
  Filled 2017-03-03 (×3): qty 1

## 2017-03-03 MED ORDER — PREDNISONE 10 MG PO TABS
10.0000 mg | ORAL_TABLET | Freq: Every day | ORAL | Status: AC
  Administered 2017-03-04: 10 mg via ORAL
  Filled 2017-03-03: qty 1

## 2017-03-03 MED ORDER — TRAZODONE HCL 50 MG PO TABS
100.0000 mg | ORAL_TABLET | Freq: Every day | ORAL | Status: DC
  Administered 2017-03-04 – 2017-03-05 (×3): 100 mg via ORAL
  Filled 2017-03-03 (×3): qty 2

## 2017-03-03 MED ORDER — VITAMIN D 1000 UNITS PO TABS
1000.0000 [IU] | ORAL_TABLET | Freq: Every day | ORAL | Status: DC
  Administered 2017-03-04 – 2017-03-06 (×3): 1000 [IU] via ORAL
  Filled 2017-03-03 (×5): qty 1

## 2017-03-03 MED ORDER — NITROGLYCERIN 0.4 MG SL SUBL: 0.4000 mg | SUBLINGUAL_TABLET | SUBLINGUAL | Status: DC | PRN

## 2017-03-03 MED ORDER — ENOXAPARIN SODIUM 40 MG/0.4ML ~~LOC~~ SOLN
40.0000 mg | SUBCUTANEOUS | Status: DC
  Administered 2017-03-04 – 2017-03-05 (×3): 40 mg via SUBCUTANEOUS
  Filled 2017-03-03 (×3): qty 0.4

## 2017-03-03 MED ORDER — MIRTAZAPINE 15 MG PO TABS
7.5000 mg | ORAL_TABLET | Freq: Every day | ORAL | Status: DC
  Administered 2017-03-04 – 2017-03-05 (×3): 7.5 mg via ORAL
  Filled 2017-03-03 (×5): qty 1

## 2017-03-03 MED ORDER — SODIUM CHLORIDE 0.9% FLUSH: 3.0000 mL | INTRAVENOUS | Status: DC | PRN

## 2017-03-03 MED ORDER — HYDRALAZINE HCL 20 MG/ML IJ SOLN: 10.0000 mg | INTRAMUSCULAR | Status: DC | PRN

## 2017-03-03 MED ORDER — DONEPEZIL HCL 5 MG PO TABS
10.0000 mg | ORAL_TABLET | Freq: Every day | ORAL | Status: DC
  Administered 2017-03-04 – 2017-03-05 (×3): 10 mg via ORAL
  Filled 2017-03-03: qty 1
  Filled 2017-03-03 (×3): qty 2
  Filled 2017-03-03: qty 1

## 2017-03-03 MED ORDER — LUBIPROSTONE 24 MCG PO CAPS
24.0000 ug | ORAL_CAPSULE | Freq: Every day | ORAL | Status: DC
Start: 2017-03-04 — End: 2017-03-06
  Administered 2017-03-04 – 2017-03-06 (×3): 24 ug via ORAL
  Filled 2017-03-03 (×3): qty 1

## 2017-03-03 MED ORDER — ACETAMINOPHEN 325 MG PO TABS
650.0000 mg | ORAL_TABLET | Freq: Four times a day (QID) | ORAL | Status: DC | PRN
Start: 2017-03-03 — End: 2017-03-06

## 2017-03-03 MED ORDER — ASPIRIN EC 81 MG PO TBEC
162.0000 mg | DELAYED_RELEASE_TABLET | Freq: Every day | ORAL | Status: DC
  Administered 2017-03-04 – 2017-03-05 (×3): 162 mg via ORAL
  Filled 2017-03-03 (×3): qty 2

## 2017-03-03 MED ORDER — SODIUM CHLORIDE 0.9 % IV SOLN: 250.0000 mL | INTRAVENOUS | Status: DC | PRN

## 2017-03-03 MED ORDER — PANTOPRAZOLE SODIUM 40 MG PO TBEC
40.0000 mg | DELAYED_RELEASE_TABLET | Freq: Every day | ORAL | Status: DC
  Administered 2017-03-04 – 2017-03-06 (×3): 40 mg via ORAL
  Filled 2017-03-03 (×3): qty 1

## 2017-03-03 MED ORDER — DARIFENACIN HYDROBROMIDE ER 7.5 MG PO TB24
7.5000 mg | ORAL_TABLET | Freq: Every day | ORAL | Status: DC
  Administered 2017-03-04 – 2017-03-06 (×3): 7.5 mg via ORAL
  Filled 2017-03-03 (×4): qty 1

## 2017-03-03 MED ORDER — FLUTICASONE PROPIONATE 50 MCG/ACT NA SUSP
1.0000 | Freq: Every day | NASAL | Status: DC
  Administered 2017-03-04 – 2017-03-06 (×3): 1 via NASAL
  Filled 2017-03-03: qty 16

## 2017-03-03 MED ORDER — ONDANSETRON HCL 4 MG/2ML IJ SOLN: 4.0000 mg | Freq: Four times a day (QID) | INTRAMUSCULAR | Status: DC | PRN

## 2017-03-03 MED ORDER — SODIUM CHLORIDE 0.9 % IV SOLN
500.0000 mg | Freq: Once | INTRAVENOUS | Status: AC
  Administered 2017-03-03: 500 mg via INTRAVENOUS
  Filled 2017-03-03: qty 5

## 2017-03-03 MED ORDER — ALBUTEROL SULFATE (2.5 MG/3ML) 0.083% IN NEBU: 3.0000 mL | INHALATION_SOLUTION | RESPIRATORY_TRACT | Status: DC | PRN

## 2017-03-03 MED ORDER — SIMVASTATIN 20 MG PO TABS
20.0000 mg | ORAL_TABLET | Freq: Every day | ORAL | Status: DC
  Administered 2017-03-04 – 2017-03-05 (×3): 20 mg via ORAL
  Filled 2017-03-03 (×3): qty 1

## 2017-03-03 MED ORDER — ADULT MULTIVITAMIN W/MINERALS CH
1.0000 | ORAL_TABLET | Freq: Every day | ORAL | Status: DC
Start: 2017-03-04 — End: 2017-03-06
  Administered 2017-03-04 – 2017-03-06 (×3): 1 via ORAL
  Filled 2017-03-03 (×3): qty 1

## 2017-03-03 MED ORDER — ONDANSETRON HCL 4 MG PO TABS: 4.0000 mg | ORAL_TABLET | Freq: Four times a day (QID) | ORAL | Status: DC | PRN

## 2017-03-03 NOTE — ED Notes (Signed)
Date and time results received: 03/03/17 9:45 PM   Test: Troponin Critical Value:0.10 Name of Provider Notified: Dr Lita Mains Orders Received? Or Actions Taken?: MD notified

## 2017-03-03 NOTE — ED Provider Notes (Signed)
[facility] EMERGENCY DEPARTMENT Provider Note   CSN: [id number] Arrival date & time: 03/03/17  1805     History   Chief Complaint Chief Complaint  Patient presents with  . Altered Mental Status    HPI [name] is a 83 y.o. male.  HPI Patient last seen at baseline at 1730 this afternoon.  Son-in-law reports seeing the patient at 1740 sitting in his chair shaking.  He was unresponsive at this time.  States the patient's eyes were rolled back and that he was rigid.  This lasted 10-15 minutes.  Patient was incontinent of urine.  Denied tongue biting.  EMS arrived and patient gradually became alert.  Patient currently denying any pain.  Denies chest pain or shortness of breath.  Has cough which is improving.  Recently treated for pneumonia and influenza.  States he is still on antibiotics.  No visual or speech changes. Past Medical History:  Diagnosis Date  . Abnormality of gait 08/01/2013  . Arthritis   . Blindness of left eye    Posttraumatic  . Carotid artery disease ([place])   . Colon polyp   . Constipation   . Diaphragmatic hernia   . Disc disease, degenerative, cervical   . Diverticulitis   . ED (erectile dysfunction)   . Gastroparesis   . GERD (gastroesophageal reflux disease)   . Glaucoma   . Heart disease   . Hyperlipidemia   . Hypertension   . Insomnia   . Memory difficulty 08/04/2014  . Nicotine dependence   . Numbness and tingling of right arm 08/04/2014  . Osteoarthritis   . Overactive bladder   . Radiculopathy   . Shortness of breath    with activity  . Sleep apnea    does not use CPAP.  Tested maybe 5- 10 years ago  . Stroke [facility])    Left side weakness.  . Unilateral inguinal hernia     Patient Active Problem List   Diagnosis Date Noted  . Protein-calorie malnutrition ([place]) 01/10/2017  . Chronic pain 10/02/2016  . Depression, major, single episode, moderate ([place]) 08/31/2016  . Chronic insomnia 08/31/2016  . Lumbar spinal stenosis 08/18/2016  .  Wheelchair bound 08/18/2016  . Dementia 08/18/2016  . Urinary incontinence 08/18/2016  . Polypharmacy 08/18/2016  . Memory difficulty 08/04/2014  . Numbness and tingling of right arm 08/04/2014  . Abnormality of gait 08/01/2013  . Anemia 12/05/2012  . Early satiety 12/03/2012  . History of colon polyps 12/03/2012  . Facial droop 10/12/2012  . Spinal stenosis of cervical region 07/01/2012  . GERD 05/27/2009  . DYSPHAGIA UNSPECIFIED 05/27/2009  . CHANGE IN BOWELS 05/27/2009  . SMOKER 09/09/2008  . Essential hypertension 09/09/2008  . History of stroke 09/09/2008  . Constipation 09/09/2008  . HIGH BLOOD PRESSURE 07/25/2006    Past Surgical History:  Procedure Laterality Date  . ANTERIOR CERVICAL DECOMP/DISCECTOMY FUSION N/A 07/01/2012   Procedure: ANTERIOR CERVICAL DECOMPRESSION/DISCECTOMY FUSION CERVICAL FIVE-SIX [name] REMOVAL CERVICAL SIX-SEVEN;  Surgeon: [name], MD;  Location: [place] [facility]URO ORS;  Service: Neurosurgery;  Laterality: N/A;  . CERVICAL FUSION    . COLONOSCOPY   08/04/2002     RMR: Normal rectum/Pancolonic diverticula/Colonic polyps as described above, biopsied and/or snared  . COLONOSCOPY  60/08/2009   RMR: normal rectum/left and right sided diverticula/multiple colonic polyps. tubular adenomas. surveillance due 2014  . COLONOSCOPY WITH ESOPHAGOGASTRODUODENOSCOPY (EGD) N/A 12/18/2012   Dr. [name]:  Colonic diverticulosis. Multiple colonic polyps-hyperplastic polyps and tubular adenoma. Friable anal canal hemorrhoids.  EGD with chronic atrophic gastritis  . ESOPHAGOGASTRODUODENOSCOPY  04/14/2002   BOF:BPZWCHEN'I' ring, status post dilation as described above/Hiatal hernia, focal antral erosions of uncertain clinical significance  . ESOPHAGOGASTRODUODENOSCOPY  06/09/2009   RMR: normal esophagus s/p dilator/small hiatal hernia otherwise normal  . HERNIA REPAIR Right    inguinal  . INGUINAL HERNIA REPAIR Left 04/15/2014   Procedure: LEFT INGUINAL HERNIORRHAPHY WITH  MESH;  Surgeon: [name], MD;  Location: [facility];  Service: General;  Laterality: Left;  . INSERTION OF MESH Left 04/15/2014   Procedure: INSERTION OF MESH;  Surgeon: [name], MD;  Location: [facility];  Service: General;  Laterality: Left;       Home Medications    Prior to Admission medications   Medication Sig Start Date End Date Taking? Authorizing Provider  albuterol (PROVENTIL HFA;VENTOLIN HFA) 108 (90 Base) MCG/ACT inhaler Inhale 2 puffs into the lungs every 4 (four) hours as needed for wheezing or shortness of breath. 02/27/17  Yes [place], Modena Nunnery, MD  amoxicillin-clavulanate (AUGMENTIN) 875-125 MG tablet Take 1 tablet by mouth 2 (two) times daily. 02/27/17  Yes [place], Modena Nunnery, MD  aspirin EC 81 MG tablet Take 162 mg by mouth at bedtime.    Yes [provider]  cholecalciferol (VITAMIN D) 1000 units tablet TAKE 1 CAPSULE BY MOUTH ONCE DAILY. 01/22/17  Yes [place], Modena Nunnery, MD  citalopram (CELEXA) 20 MG tablet TAKE 1 TABLET BY MOUTH ONCE A DAY FOR DEPRESSION. 02/22/17  Yes [place], Modena Nunnery, MD  donepezil (ARICEPT) 10 MG tablet TAKE (1) TABLET BY MOUTH AT BEDTIME. 05/20/15  Yes Kathrynn Ducking, MD  fluticasone (FLONASE) 50 MCG/ACT nasal spray USE 1 OR 2 SPRAYS IN EACH NOSTRIL EVERY DAY FOR NASAL ALLERGIES. 02/22/17  Yes [place], Modena Nunnery, MD  HYDROcodone-acetaminophen (NORCO/VICODIN) 5-325 MG tablet TAKE 1 TABLET BY MOUTH THREE TIMES DAILY AS NEEDED FOR MODERATE PAIN 02/23/17  Yes [place], Modena Nunnery, MD  lisinopril (PRINIVIL,ZESTRIL) 10 MG tablet Take 10 mg by mouth daily.   Yes [provider]  LORazepam (ATIVAN) 1 MG tablet TAKE (1) TABLET BY MOUTH AT BEDTIME. 11/21/16  Yes [place], Modena Nunnery, MD  lubiprostone (AMITIZA) 24 MCG capsule Take 1 capsule (24 mcg total) by mouth daily with breakfast. MAY TAKE ADDITIONAL DOSE QD PRN. 01/04/17  Yes [place], Modena Nunnery, MD  mirtazapine (REMERON) 15 MG tablet TAKE 1/2 TABLET(7.5MG ) BY MOUTH AT BEDTIME FOR APPETITE. 02/14/17   Yes [place], Modena Nunnery, MD  Multiple Vitamin (MULTIVITAMIN WITH MINERALS) TABS tablet Take 1 tablet by mouth daily. 02/15/17  Yes [place], Modena Nunnery, MD  naproxen sodium (ALEVE) 220 MG tablet Take 440 mg by mouth daily as needed.   Yes [provider]  nitroGLYCERIN (NITROSTAT) 0.4 MG SL tablet Place 0.4 mg under the tongue every 5 (five) minutes as needed for chest pain.   Yes [provider]  omeprazole (PRILOSEC) 20 MG capsule TAKE 1 CAPSULE BY MOUTH ONCE DAILY FOR RELFUX ESOPHAGITIS OR STOMACH ULCERS. 02/14/17  Yes [place], Modena Nunnery, MD  predniSONE (DELTASONE) 10 MG tablet Take 40mg  x 3 days,20mg  x 3 days, 10mg  x 3 days 02/27/17  Yes [place], Modena Nunnery, MD  traZODone (DESYREL) 100 MG tablet TAKE (1) TABLET BY MOUTH AT BEDTIME. 01/22/17  Yes [place], Modena Nunnery, MD  VESICARE 5 MG tablet TAKE ONE TABLET ONCE DAILY FOR BLADDER. 02/14/17  Yes [place], Modena Nunnery, MD    Family History Family History  Problem Relation Age of Onset  .  Cancer Mother   . Dementia Father   . Colon cancer Neg Hx     Social History Social History   Tobacco Use  . Smoking status: Former Smoker    Packs/day: 0.50    Years: 60.00    Pack years: 30.00    Types: Cigarettes  . Smokeless tobacco: Never Used  Substance Use Topics  . Alcohol use: No  . Drug use: No     Allergies   Aspirin   Review of Systems Review of Systems  Constitutional: Negative for chills and fever.  Respiratory: Positive for cough. Negative for shortness of breath.   Cardiovascular: Negative for chest pain, palpitations and leg swelling.  Gastrointestinal: Negative for abdominal pain, diarrhea, nausea and vomiting.  Genitourinary: Negative for dysuria, flank pain and frequency.  Musculoskeletal: Negative for back pain, neck pain and neck stiffness.  Skin: Negative for rash and wound.  Neurological: Positive for seizures and syncope. Negative for dizziness, weakness, light-headedness, numbness and headaches.    Psychiatric/Behavioral: Positive for confusion.  All other systems reviewed and are negative.    Physical Exam Updated Vital Signs BP (!) 165/83   Pulse 62   Temp 97.9 F (36.6 C)   Resp 14   Ht 5\' 9"  (1.753 m)   SpO2 97%   BMI 25.10 kg/m   Physical Exam  Constitutional: He is oriented to person, place, and time. He appears well-developed and well-nourished. No distress.  HENT:  Head: Normocephalic and atraumatic.  Mouth/Throat: Oropharynx is clear and moist.  No obvious intraoral trauma.  Poor dentition.  Eyes: EOM are normal. Pupils are equal, round, and reactive to light.  Disconjugate left lateral gaze in the left eye.  Unchanged per family.  Neck: Normal range of motion. Neck supple.  No meningismus.  No posterior midline cervical tenderness to palpation.  Cardiovascular: Normal rate and regular rhythm. Exam reveals no gallop and no friction rub.  No murmur heard. Pulmonary/Chest: Effort normal and breath sounds normal. No stridor. No respiratory distress. He has no wheezes. He has no rales. He exhibits no tenderness.  Abdominal: Soft. Bowel sounds are normal. There is no tenderness. There is no rebound and no guarding.  Musculoskeletal: Normal range of motion. He exhibits no edema or tenderness.  No lower extremity swelling, asymmetry or tenderness.  Distal pulses are 2+.  Neurological: He is alert and oriented to person, place, and time.  Patient is alert and oriented x3 with clear, goal oriented speech. Patient has 5/5 motor bilateral upper extremities.  She does have some mild drift in bilateral lower extremities.  No significant change per family.  Sensation is intact to light touch.   Skin: Skin is warm and dry. Capillary refill takes less than 2 seconds. No rash noted. No erythema.  Psychiatric: He has a normal mood and affect. His behavior is normal.  Nursing note and vitals reviewed.    ED Treatments / Results  Labs (all labs ordered are listed, but only  abnormal results are displayed) Labs Reviewed  BASIC METABOLIC PANEL - Abnormal; Notable for the following components:      Result Value   Glucose, Bld 132 (*)    All other components within normal limits  TROPONIN I - Abnormal; Notable for the following components:   Troponin I 0.10 (*)    All other components within normal limits  CBC WITH DIFFERENTIAL/PLATELET  URINALYSIS, ROUTINE W REFLEX MICROSCOPIC  MAGNESIUM    EKG  EKG Interpretation  Date/Time:  Saturday March 03 2017 18:18:27 EST Ventricular Rate:  68 PR Interval:    QRS Duration: 82 QT Interval:  423 QTC Calculation: 450 R Axis:   80 Text Interpretation:  Sinus rhythm Prolonged PR interval Probable anteroseptal infarct, old Confirmed by Julianne Rice [phone]) on 03/03/2017 7:05:09 PM       Radiology Dg Chest 2 View  Result Date: 03/03/2017 CLINICAL DATA:  Patient with recent flu and pneumonia. EXAM: CHEST  2 VIEW COMPARISON:  Chest radiograph 01/06/2017; 06/16/2015. FINDINGS: Monitoring leads overlie the patient. Stable cardiomegaly. Aortic atherosclerosis. Interval development of peripheral right mid lung and left lower lung patchy consolidative opacities. Probable small bilateral pleural effusions. Chronic changes within the left lower lung. Thoracic spine degenerative changes. IMPRESSION: Findings suggestive of possible right mid lung and left lower lung consolidation concerning for pneumonia in the appropriate clinical setting. Followup PA and lateral chest X-ray is recommended in 3-4 weeks following trial of antibiotic therapy to ensure resolution and exclude underlying malignancy. Electronically Signed   By: Lovey Newcomer M.D.   On: 03/03/2017 19:54   Ct Head Wo Contrast  Result Date: 03/03/2017 CLINICAL DATA:  Patient found unresponsive at 1740 hours today. EXAM: CT HEAD WITHOUT CONTRAST TECHNIQUE: Contiguous axial images were obtained from the base of the skull through the vertex without intravenous contrast.  COMPARISON:  Head CT scan 10/12/2012.  Brain MRI 08/13/2014. FINDINGS: Brain: No evidence of acute infarction, hemorrhage, hydrocephalus, extra-axial collection or mass lesion/mass effect. Cortical atrophy and chronic microvascular ischemic change are seen. Vascular: No hyperdense vessel or unexpected calcification. Skull: Intact. Sinuses/Orbits: Mucosal thickening is seen in all the imaged paranasal sinuses. Other: None. IMPRESSION: No acute abnormality. Atrophy and chronic microvascular ischemic disease. Sinus disease. Electronically Signed   By: Inge Rise M.D.   On: 03/03/2017 19:53    Procedures Procedures (including critical care time)  Medications Ordered in ED Medications  levETIRAcetam (KEPPRA) 500 mg in sodium chloride 0.9 % 100 mL IVPB (not administered)     Initial Impression / Assessment and Plan / ED Course  I have reviewed the triage vital signs and the nursing notes.  Pertinent labs & imaging results that were available during my care of the patient were reviewed by me and considered in my medical decision making (see chart for details).    Patient remains at his neurologic baseline.  Vital signs remained stable.  Questionable seizure-like episode.  CT head without acute findings.  Patient does have multifocal pneumonia but normal white blood cell count.  Mild elevation in troponin at 0.1.  Continues to deny chest pain.  EKG without acute findings.  Discussed with Dr. Cheral Marker, neurologist on call.  Would recommend loading the patient with 500 mg of Keppra IV and then 500 mg p.o. twice daily.  Does not believe the patient certainly needs to be emergently transferred to [facility] for MRI.  Discussed with hospitalist will see patient in the emergency department and admit for observation and trending troponin.   Final Clinical Impressions(s) / ED Diagnoses   Final diagnoses:  Witnessed seizure-like activity ([place])  Elevated troponin    ED Discharge Orders    None        Julianne Rice, MD 03/03/17 2258

## 2017-03-03 NOTE — H&P (Addendum)
History and Physical    [name] PPI:[id number] DOB: Jul 07, 1934 DOA: 03/03/2017  PCP: Alycia Rossetti, MD   Patient coming from: Home  Chief Complaint: Seizure-activity  HPI: [name] is a 83 y.o. male with medical history significant for hypertension, obstructive sleep apnea, GERD, dyslipidemia, hypertension, prior CVA, and dementia who was brought to the emergency department after he was noted to have what appeared to be seizure-like activity at approximately 1730 today.  His son-in-law witnessed the event and the patient was noted to be shaking in his chair and his eyes rolled back and he became rigid and went into tonic-clonic seizures.  The episode lasted approximately 10-15 minutes and the patient urinated over himself, but did not have any biting of his tongue.  He was difficult to arouse for several minutes after the episode until EMS arrived at which point, the patient became more alert.  The patient was being treated for recent pneumonia with Augmentin and began the course approximately 1 week ago.  He was also on a prednisone taper which he states is almost finished. Patient has a mild headache, denies any visual or speech changes. He denies any numbness or tingling or weakness. He denies any chest pain, or shortness of breath, he still continues to have a cough with whitish sputum. He denies any fevers or chills.   ED Course: Vital signs are stable and EKG demonstrates sinus rhythm.  He is noted to have a troponin of 0.10.  Chest x-ray demonstrates right middle lobe and left lower lobe consolidation suggestive of pneumonia.  CT head demonstrates no acute findings at this time.  I had requested ED physician Dr. Lita Mains to call neurology and he spoke with Dr. Cheral Marker at [facility] who recommends Keppra loading with 500 mg IV and 500 mg p.o. twice daily thereafter.  He does not recommend transfer, nor does the patient require urgent MRI evaluation.  Review of Systems: All others  reviewed and otherwise negative.  Past Medical History:  Diagnosis Date  . Abnormality of gait 08/01/2013  . Arthritis   . Blindness of left eye    Posttraumatic  . Carotid artery disease ([place])   . Colon polyp   . Constipation   . Diaphragmatic hernia   . Disc disease, degenerative, cervical   . Diverticulitis   . ED (erectile dysfunction)   . Gastroparesis   . GERD (gastroesophageal reflux disease)   . Glaucoma   . Heart disease   . Hyperlipidemia   . Hypertension   . Insomnia   . Memory difficulty 08/04/2014  . Nicotine dependence   . Numbness and tingling of right arm 08/04/2014  . Osteoarthritis   . Overactive bladder   . Radiculopathy   . Shortness of breath    with activity  . Sleep apnea    does not use CPAP.  Tested maybe 5- 10 years ago  . Stroke [facility])    Left side weakness.  . Unilateral inguinal hernia     Past Surgical History:  Procedure Laterality Date  . ANTERIOR CERVICAL DECOMP/DISCECTOMY FUSION N/A 07/01/2012   Procedure: ANTERIOR CERVICAL DECOMPRESSION/DISCECTOMY FUSION CERVICAL FIVE-SIX [name] REMOVAL CERVICAL SIX-SEVEN;  Surgeon: [name], MD;  Location: [place] [facility];  Service: Neurosurgery;  Laterality: N/A;  . CERVICAL FUSION    . COLONOSCOPY   08/04/2002     RMR: Normal rectum/Pancolonic diverticula/Colonic polyps as described above, biopsied and/or snared  . COLONOSCOPY  60/08/2009   RMR: normal rectum/left and right sided diverticula/multiple  colonic polyps. tubular adenomas. surveillance due 2014  . COLONOSCOPY WITH ESOPHAGOGASTRODUODENOSCOPY (EGD) N/A 12/18/2012   Dr. [name]:  Colonic diverticulosis. Multiple colonic polyps-hyperplastic polyps and tubular adenoma. Friable anal canal hemorrhoids. EGD with chronic atrophic gastritis  . ESOPHAGOGASTRODUODENOSCOPY  04/14/2002   MWU:[id number]'U' ring, status post dilation as described above/Hiatal hernia, focal antral erosions of uncertain clinical significance  . ESOPHAGOGASTRODUODENOSCOPY   06/09/2009   RMR: normal esophagus s/p dilator/small hiatal hernia otherwise normal  . HERNIA REPAIR Right    inguinal  . INGUINAL HERNIA REPAIR Left 04/15/2014   Procedure: LEFT INGUINAL HERNIORRHAPHY WITH MESH;  Surgeon: [name], MD;  Location: [facility];  Service: General;  Laterality: Left;  . INSERTION OF MESH Left 04/15/2014   Procedure: INSERTION OF MESH;  Surgeon: [name], MD;  Location: [facility];  Service: General;  Laterality: Left;     reports that he has quit smoking. His smoking use included cigarettes. He has a 30.00 pack-year smoking history. he has never used smokeless tobacco. He reports that he does not drink alcohol or use drugs.  Allergies  Allergen Reactions  . Aspirin Nausea And Vomiting    Tolerates 81mg  daily    Family History  Problem Relation Age of Onset  . Cancer Mother   . Dementia Father   . Colon cancer Neg Hx     Prior to Admission medications   Medication Sig Start Date End Date Taking? Authorizing Provider  albuterol (PROVENTIL HFA;VENTOLIN HFA) 108 (90 Base) MCG/ACT inhaler Inhale 2 puffs into the lungs every 4 (four) hours as needed for wheezing or shortness of breath. 02/27/17  Yes [place], Modena Nunnery, MD  amoxicillin-clavulanate (AUGMENTIN) 875-125 MG tablet Take 1 tablet by mouth 2 (two) times daily. 02/27/17  Yes [place], Modena Nunnery, MD  aspirin EC 81 MG tablet Take 162 mg by mouth at bedtime.    Yes [provider]  cholecalciferol (VITAMIN D) 1000 units tablet TAKE 1 CAPSULE BY MOUTH ONCE DAILY. 01/22/17  Yes [place], Modena Nunnery, MD  citalopram (CELEXA) 20 MG tablet TAKE 1 TABLET BY MOUTH ONCE A DAY FOR DEPRESSION. 02/22/17  Yes [place], Modena Nunnery, MD  donepezil (ARICEPT) 10 MG tablet TAKE (1) TABLET BY MOUTH AT BEDTIME. 05/20/15  Yes Kathrynn Ducking, MD  fluticasone (FLONASE) 50 MCG/ACT nasal spray USE 1 OR 2 SPRAYS IN EACH NOSTRIL EVERY DAY FOR NASAL ALLERGIES. 02/22/17  Yes [place], Modena Nunnery, MD  HYDROcodone-acetaminophen  (NORCO/VICODIN) 5-325 MG tablet TAKE 1 TABLET BY MOUTH THREE TIMES DAILY AS NEEDED FOR MODERATE PAIN 02/23/17  Yes [place], Modena Nunnery, MD  lisinopril (PRINIVIL,ZESTRIL) 10 MG tablet Take 10 mg by mouth daily.   Yes [provider]  LORazepam (ATIVAN) 1 MG tablet TAKE (1) TABLET BY MOUTH AT BEDTIME. 11/21/16  Yes [place], Modena Nunnery, MD  lubiprostone (AMITIZA) 24 MCG capsule Take 1 capsule (24 mcg total) by mouth daily with breakfast. MAY TAKE ADDITIONAL DOSE QD PRN. 01/04/17  Yes [place], Modena Nunnery, MD  mirtazapine (REMERON) 15 MG tablet TAKE 1/2 TABLET(7.5MG ) BY MOUTH AT BEDTIME FOR APPETITE. 02/14/17  Yes [place], Modena Nunnery, MD  Multiple Vitamin (MULTIVITAMIN WITH MINERALS) TABS tablet Take 1 tablet by mouth daily. 02/15/17  Yes [place], Modena Nunnery, MD  naproxen sodium (ALEVE) 220 MG tablet Take 440 mg by mouth daily as needed.   Yes [provider]  nitroGLYCERIN (NITROSTAT) 0.4 MG SL tablet Place 0.4 mg under the tongue every 5 (five) minutes as needed for chest pain.  Yes [provider]  omeprazole (PRILOSEC) 20 MG capsule TAKE 1 CAPSULE BY MOUTH ONCE DAILY FOR RELFUX ESOPHAGITIS OR STOMACH ULCERS. 02/14/17  Yes [place], Modena Nunnery, MD  predniSONE (DELTASONE) 10 MG tablet Take 40mg  x 3 days,20mg  x 3 days, 10mg  x 3 days 02/27/17  Yes [place], Modena Nunnery, MD  traZODone (DESYREL) 100 MG tablet TAKE (1) TABLET BY MOUTH AT BEDTIME. 01/22/17  Yes [place], Modena Nunnery, MD  VESICARE 5 MG tablet TAKE ONE TABLET ONCE DAILY FOR BLADDER. 02/14/17  Yes Alycia Rossetti, MD    Physical Exam: Vitals:   03/03/17 2130 03/03/17 2150 03/03/17 2200 03/03/17 2300  BP: (!) 164/75  (!) 165/83 (!) 177/75  Pulse: (!) 58  62 66  Resp: 14  14 14   Temp:  97.9 F (36.6 C)    TempSrc:      SpO2: 95%  97% 96%  Height:        Constitutional: NAD, calm, comfortable Vitals:   03/03/17 2130 03/03/17 2150 03/03/17 2200 03/03/17 2300  BP: (!) 164/75  (!) 165/83 (!) 177/75  Pulse: (!) 58  62 66  Resp: 14  14  14   Temp:  97.9 F (36.6 C)    TempSrc:      SpO2: 95%  97% 96%  Height:       Eyes: lids and conjunctivae normal ENMT: Mucous membranes are moist.  Neck: normal, supple Respiratory: clear to auscultation bilaterally. Normal respiratory effort. No accessory muscle use.  Cardiovascular: Regular rate and rhythm, no murmurs. No extremity edema. Abdomen: no tenderness, no distention. Bowel sounds positive.  Musculoskeletal:  No joint deformity upper and lower extremities.   Skin: no rashes, lesions, ulcers.  Psychiatric: Normal judgment and insight. Alert and oriented x 3. Normal mood.   Labs on Admission: I have personally reviewed following labs and imaging studies  CBC: Recent Labs  Lab 03/03/17 2024  WBC 5.3  NEUTROABS 3.7  HGB 13.0  HCT 40.0  MCV 94.1  PLT 175   Basic Metabolic Panel: Recent Labs  Lab 03/03/17 2024  NA 139  K 4.7  CL 104  CO2 26  GLUCOSE 132*  BUN 18  CREATININE 0.85  CALCIUM 9.1  MG 2.1   GFR: CrCl cannot be calculated (Unknown ideal weight.). Liver Function Tests: No results for input(s): AST, ALT, ALKPHOS, BILITOT, PROT, ALBUMIN in the last 168 hours. No results for input(s): LIPASE, AMYLASE in the last 168 hours. No results for input(s): AMMONIA in the last 168 hours. Coagulation Profile: No results for input(s): INR, PROTIME in the last 168 hours. Cardiac Enzymes: Recent Labs  Lab 03/03/17 2024  TROPONINI 0.10*   BNP (last 3 results) No results for input(s): PROBNP in the last 8760 hours. HbA1C: No results for input(s): HGBA1C in the last 72 hours. CBG: No results for input(s): GLUCAP in the last 168 hours. Lipid Profile: No results for input(s): CHOL, HDL, LDLCALC, TRIG, CHOLHDL, LDLDIRECT in the last 72 hours. Thyroid Function Tests: No results for input(s): TSH, T4TOTAL, FREET4, T3FREE, THYROIDAB in the last 72 hours. Anemia Panel: No results for input(s): VITAMINB12, FOLATE, FERRITIN, TIBC, IRON, RETICCTPCT in the last 72  hours. Urine analysis:    Component Value Date/Time   COLORURINE YELLOW 03/03/2017 1827   APPEARANCEUR CLEAR 03/03/2017 1827   LABSPEC 1.012 03/03/2017 1827   PHURINE 7.0 03/03/2017 1827   GLUCOSEU NEGATIVE 03/03/2017 1827   HGBUR NEGATIVE 03/03/2017 1827   BILIRUBINUR NEGATIVE 03/03/2017 1827   KETONESUR NEGATIVE 03/03/2017  [place] 03/03/2017 1827   UROBILINOGEN 0.2 04/26/2014 0930   NITRITE NEGATIVE 03/03/2017 1827   LEUKOCYTESUR NEGATIVE 03/03/2017 1827    Radiological Exams on Admission: Dg Chest 2 View  Result Date: 03/03/2017 CLINICAL DATA:  Patient with recent flu and pneumonia. EXAM: CHEST  2 VIEW COMPARISON:  Chest radiograph 01/06/2017; 06/16/2015. FINDINGS: Monitoring leads overlie the patient. Stable cardiomegaly. Aortic atherosclerosis. Interval development of peripheral right mid lung and left lower lung patchy consolidative opacities. Probable small bilateral pleural effusions. Chronic changes within the left lower lung. Thoracic spine degenerative changes. IMPRESSION: Findings suggestive of possible right mid lung and left lower lung consolidation concerning for pneumonia in the appropriate clinical setting. Followup PA and lateral chest X-ray is recommended in 3-4 weeks following trial of antibiotic therapy to ensure resolution and exclude underlying malignancy. Electronically Signed   By: Lovey Newcomer M.D.   On: 03/03/2017 19:54   Ct Head Wo Contrast  Result Date: 03/03/2017 CLINICAL DATA:  Patient found unresponsive at 1740 hours today. EXAM: CT HEAD WITHOUT CONTRAST TECHNIQUE: Contiguous axial images were obtained from the base of the skull through the vertex without intravenous contrast. COMPARISON:  Head CT scan 10/12/2012.  Brain MRI 08/13/2014. FINDINGS: Brain: No evidence of acute infarction, hemorrhage, hydrocephalus, extra-axial collection or mass lesion/mass effect. Cortical atrophy and chronic microvascular ischemic change are seen. Vascular: No  hyperdense vessel or unexpected calcification. Skull: Intact. Sinuses/Orbits: Mucosal thickening is seen in all the imaged paranasal sinuses. Other: None. IMPRESSION: No acute abnormality. Atrophy and chronic microvascular ischemic disease. Sinus disease. Electronically Signed   By: Inge Rise M.D.   On: 03/03/2017 19:53    EKG: Independently reviewed. SR.  Assessment/Plan Principal Problem:   Seizure [facility]) Active Problems:   Essential hypertension   History of stroke   Dementia   Pneumonia    1. New onset seizure with noted history of prior CVA.  Keppra load and continued on Keppra 500 mg twice daily as recommended by neurology.  Continue telemetry monitoring.  Recommend eventual MRI/MRA evaluation to rule out CVA.  I will order 2D echocardiogram as well as carotid ultrasound and EEG for now.  Previous echo performed on 10/2012 with noted EF 65-70% with moderate LVH and grade 1 diastolic dysfunction.  Maintain on seizure precautions and aspirin 162 mg daily.  Initiate statin and check lipid panel and TSH in a.m.  Ativan as needed for seizure activity. Neurochecks. 2. Elevated troponin.  This is likely secondary to above and patient denies any chest pain.  We will continue to trend. 3. Bilateral pneumonia.  I will start on IV Zosyn for aspiration coverage due to seizure activity.  Obtain pro calcitonin.  He has been on oral Augmentin for some time and therefore sputum cultures would be futile.  Will obtain urine Legionella and strep pneumonia. 4. Hypertension.  Hold home lisinopril for now and allow for some permissive hypertension.  Hydralazine as needed for systolic greater than 782 and diastolic greater than 956. 5. Dementia.  Continue Aricept.   DVT prophylaxis: Lovenox Code Status: DNR Family Communication: Wife at bedside Disposition Plan:Seizure management Consults called:EDP spoke with Dr. Cheral Marker on phone who does not recommend transfer at this time Admission status:  Inpatient, tele   Temica Righetti D [place] [facility] Pager [phone]  If 7PM-7AM, please contact night-coverage [email or web address] Password [facility]  03/03/2017, 11:16 PM

## 2017-03-03 NOTE — ED Notes (Signed)
[place]ordin[place], [place] notified of need for Keppra

## 2017-03-03 NOTE — ED Notes (Signed)
Pt given coffee and crackers with peanut butter per Dr Lita Mains okay

## 2017-03-03 NOTE — ED Notes (Signed)
Pt remains NPO at this time per Dr Lita Mains.

## 2017-03-03 NOTE — ED Notes (Signed)
ED Provider at bedside. 

## 2017-03-03 NOTE — ED Triage Notes (Signed)
Pt recently got over flu and pneumonia.  Reports around 1730 pt was seen normal.  Son in law stopped by the house at 1740 and found pt unresponsive.  EMS arrived and pt unresponsive and decreased respirations.  Reports rr 4bpm and shallow.  Reports pt had radial pulses.  CBG 143.  EMS reports once in the ambulance they were able to get him aroused and he was lucid.  Pt presently alert and oriented.  Pt says he could hear what was going on around him but he couldn't do anything.  Pt's pcp told him he thought he had a small stroke a few weeks ago.  EMS says pt had a left sided facial droop initially but is now corrected.  Pt still taking antibiotics for pneumonia.  Pt denies any pain.

## 2017-03-04 ENCOUNTER — Other Ambulatory Visit: Payer: Self-pay

## 2017-03-04 ENCOUNTER — Encounter (HOSPITAL_COMMUNITY): Payer: Self-pay | Admitting: *Deleted

## 2017-03-04 ENCOUNTER — Inpatient Hospital Stay (HOSPITAL_COMMUNITY): Payer: Medicare Other

## 2017-03-04 DIAGNOSIS — R748 Abnormal levels of other serum enzymes: Secondary | ICD-10-CM

## 2017-03-04 DIAGNOSIS — F0150 Vascular dementia without behavioral disturbance: Secondary | ICD-10-CM

## 2017-03-04 DIAGNOSIS — R569 Unspecified convulsions: Secondary | ICD-10-CM

## 2017-03-04 DIAGNOSIS — I340 Nonrheumatic mitral (valve) insufficiency: Secondary | ICD-10-CM

## 2017-03-04 DIAGNOSIS — I10 Essential (primary) hypertension: Secondary | ICD-10-CM

## 2017-03-04 LAB — BASIC METABOLIC PANEL
Anion gap: 8 (ref 5–15)
BUN: 18 mg/dL (ref 6–20)
CHLORIDE: 104 mmol/L (ref 101–111)
CO2: 26 mmol/L (ref 22–32)
Calcium: 8.7 mg/dL — ABNORMAL LOW (ref 8.9–10.3)
Creatinine, Ser: 0.89 mg/dL (ref 0.61–1.24)
Glucose, Bld: 90 mg/dL (ref 65–99)
Potassium: 3.9 mmol/L (ref 3.5–5.1)
SODIUM: 138 mmol/L (ref 135–145)

## 2017-03-04 LAB — CBC
HCT: 36.6 % — ABNORMAL LOW (ref 39.0–52.0)
Hemoglobin: 11.7 g/dL — ABNORMAL LOW (ref 13.0–17.0)
MCH: 30 pg (ref 26.0–34.0)
MCHC: 32 g/dL (ref 30.0–36.0)
MCV: 93.8 fL (ref 78.0–100.0)
PLATELETS: 220 10*3/uL (ref 150–400)
RBC: 3.9 MIL/uL — AB (ref 4.22–5.81)
RDW: 14.1 % (ref 11.5–15.5)
WBC: 8.3 10*3/uL (ref 4.0–10.5)

## 2017-03-04 LAB — PROCALCITONIN
Procalcitonin: <0.1 ng/mL
Procalcitonin: <0.1 ng/mL

## 2017-03-04 LAB — LIPID PANEL
CHOL/HDL RATIO: 2.7 ratio
Cholesterol: 114 mg/dL (ref 0–200)
HDL: 43 mg/dL (ref >40)
LDL Cholesterol: 62 mg/dL (ref 0–99)
TRIGLYCERIDES: 45 mg/dL (ref <150)
VLDL: 9 mg/dL (ref 0–40)

## 2017-03-04 LAB — STREP PNEUMONIAE URINARY ANTIGEN: STREP PNEUMO URINARY ANTIGEN: NEGATIVE

## 2017-03-04 LAB — ECHOCARDIOGRAM COMPLETE: Height: 69 in

## 2017-03-04 LAB — TROPONIN I
TROPONIN I: 0.08 ng/mL — AB (ref <0.03)
Troponin I: 0.09 ng/mL (ref <0.03)
Troponin I: 0.1 ng/mL (ref <0.03)

## 2017-03-04 LAB — TSH: TSH: 0.653 u[IU]/mL (ref 0.350–4.500)

## 2017-03-04 MED ORDER — IOPAMIDOL (ISOVUE-370) INJECTION 76%
100.0000 mL | Freq: Once | INTRAVENOUS | Status: AC | PRN
  Administered 2017-03-04: 100 mL via INTRAVENOUS

## 2017-03-04 MED ORDER — PIPERACILLIN-TAZOBACTAM 3.375 G IVPB
3.3750 g | Freq: Three times a day (TID) | INTRAVENOUS | Status: DC
  Administered 2017-03-04 – 2017-03-06 (×8): 3.375 g via INTRAVENOUS
  Filled 2017-03-04 (×8): qty 50

## 2017-03-04 NOTE — Progress Notes (Signed)
*  PRELIMINARY RESULTS* Echocardiogram 2D Echocardiogram has been performed.  Leavy Cella 03/04/2017, 8:47 AM

## 2017-03-04 NOTE — Evaluation (Signed)
Clinical/Bedside Swallow Evaluation Patient Details  Name: [name] MRN: [id number] Date of Birth: 11-03-1934  Today's Date: 03/04/2017 Time: SLP Start Time (ACUTE ONLY): 0938 SLP Stop Time (ACUTE ONLY): 1600 SLP Time Calculation (min) (ACUTE ONLY): 27 min  Past Medical History:  Past Medical History:  Diagnosis Date  . Abnormality of gait 08/01/2013  . Arthritis   . Blindness of left eye    Posttraumatic  . Carotid artery disease ([place])   . Colon polyp   . Constipation   . Diaphragmatic hernia   . Disc disease, degenerative, cervical   . Diverticulitis   . ED (erectile dysfunction)   . Gastroparesis   . GERD (gastroesophageal reflux disease)   . Glaucoma   . Heart disease   . Hyperlipidemia   . Hypertension   . Insomnia   . Memory difficulty 08/04/2014  . Nicotine dependence   . Numbness and tingling of right arm 08/04/2014  . Osteoarthritis   . Overactive bladder   . Radiculopathy   . Shortness of breath    with activity  . Sleep apnea    does not use CPAP.  Tested maybe 5- 10 years ago  . Stroke [name])    Left side weakness.  . Unilateral inguinal hernia    Past Surgical History:  Past Surgical History:  Procedure Laterality Date  . ANTERIOR CERVICAL DECOMP/DISCECTOMY FUSION N/A 07/01/2012   Procedure: ANTERIOR CERVICAL DECOMPRESSION/DISCECTOMY FUSION CERVICAL FIVE-SIX [name] REMOVAL CERVICAL SIX-SEVEN;  Surgeon: [name], MD;  Location: [place] [facility];  Service: Neurosurgery;  Laterality: N/A;  . CERVICAL FUSION    . COLONOSCOPY   08/04/2002     RMR: Normal rectum/Pancolonic diverticula/Colonic polyps as described above, biopsied and/or snared  . COLONOSCOPY  60/08/2009   RMR: normal rectum/left and right sided diverticula/multiple colonic polyps. tubular adenomas. surveillance due 2014  . COLONOSCOPY WITH ESOPHAGOGASTRODUODENOSCOPY (EGD) N/A 12/18/2012   Dr. [name]:  Colonic diverticulosis. Multiple colonic polyps-hyperplastic polyps and tubular adenoma. Friable  anal canal hemorrhoids. EGD with chronic atrophic gastritis  . ESOPHAGOGASTRODUODENOSCOPY  04/14/2002   HWE:[id number]'V' ring, status post dilation as described above/Hiatal hernia, focal antral erosions of uncertain clinical significance  . ESOPHAGOGASTRODUODENOSCOPY  06/09/2009   RMR: normal esophagus s/p dilator/small hiatal hernia otherwise normal  . HERNIA REPAIR Right    inguinal  . INGUINAL HERNIA REPAIR Left 04/15/2014   Procedure: LEFT INGUINAL HERNIORRHAPHY WITH MESH;  Surgeon: [name], MD;  Location: [facility];  Service: General;  Laterality: Left;  . INSERTION OF MESH Left 04/15/2014   Procedure: INSERTION OF MESH;  Surgeon: [name], MD;  Location: [facility];  Service: General;  Laterality: Left;   HPI:  [name] is a 83 y.o. male with medical history significant for hypertension, obstructive sleep apnea, GERD, dyslipidemia, hypertension, prior CVA, and dementia who was brought to the emergency department after he was noted to have what appeared to be seizure-like activity at approximately 1730 today.  His son-in-law witnessed the event and the patient was noted to be shaking in his chair and his eyes rolled back and he became rigid and went into tonic-clonic seizures.  The episode lasted approximately 10-15 minutes and the patient urinated over himself, but did not have any biting of his tongue.  He was difficult to arouse for several minutes after the episode until EMS arrived at which point, the patient became more alert.  The patient was being treated for recent pneumonia with Augmentin and began the course approximately 1 week  ago.  He was also on a prednisone taper which he states is almost finished. BSE requested.   Assessment / Plan / Recommendation Clinical Impression  Pt shows no overt signs or symptoms of aspiration or decreased airwary protection with regular textures or thin liquids. Pt able to self present all foods/liquids and does not modify textures despite  sparse dentition. Pt reports that he has had PNA since January and has not been able to "get rid of it". Pt had MBSS in 2016 with recommendation for regular/thin with question of small laryngocele which was clinically insignificant at the time. If MD suspects aspiration as a source for PNA, MBSS may be prudent during this admission. Continue diet as ordered and SLP will follow up tomorrow.   SLP Visit Diagnosis: Dysphagia, unspecified (R13.10)    Aspiration Risk  No limitations    Diet Recommendation Regular;Thin liquid   Liquid Administration via: Straw;Cup Medication Administration: Whole meds with liquid Supervision: Patient able to self feed;Intermittent supervision to cue for compensatory strategies Postural Changes: Seated upright at 90 degrees;Remain upright for at least 30 minutes after po intake    Other  Recommendations Oral Care Recommendations: Oral care BID;Staff/trained caregiver to provide oral care Other Recommendations: Clarify dietary restrictions   Follow up Recommendations None      Frequency and Duration min 2x/week  1 week       Prognosis Prognosis for Safe Diet Advancement: Good      Swallow Study   General Date of Onset: 03/03/17 HPI: [name] is a 83 y.o. male with medical history significant for hypertension, obstructive sleep apnea, GERD, dyslipidemia, hypertension, prior CVA, and dementia who was brought to the emergency department after he was noted to have what appeared to be seizure-like activity at approximately 1730 today.  His son-in-law witnessed the event and the patient was noted to be shaking in his chair and his eyes rolled back and he became rigid and went into tonic-clonic seizures.  The episode lasted approximately 10-15 minutes and the patient urinated over himself, but did not have any biting of his tongue.  He was difficult to arouse for several minutes after the episode until EMS arrived at which point, the patient became more alert.  The  patient was being treated for recent pneumonia with Augmentin and began the course approximately 1 week ago.  He was also on a prednisone taper which he states is almost finished. BSE requested. Type of Study: Bedside Swallow Evaluation Previous Swallow Assessment: MBSS in 2016 reg/thin Diet Prior to this Study: Regular;Thin liquids Temperature Spikes Noted: No Respiratory Status: Room air History of Recent Intubation: No Behavior/Cognition: Alert;Cooperative;Pleasant mood Oral Cavity Assessment: Within Functional Limits Oral Care Completed by SLP: No Oral Cavity - Dentition: Missing dentition Vision: Functional for self-feeding Self-Feeding Abilities: Able to feed self Patient Positioning: Upright in bed Baseline Vocal Quality: Normal Volitional Cough: Strong;Congested Volitional Swallow: Able to elicit    Oral/Motor/Sensory Function Overall Oral Motor/Sensory Function: Within functional limits   Ice Chips Ice chips: Not tested   Thin Liquid Thin Liquid: Within functional limits Presentation: Cup;Self Fed;Straw    Nectar Thick Nectar Thick Liquid: Not tested   Honey Thick Honey Thick Liquid: Not tested   Puree Puree: Within functional limits Presentation: Spoon   Solid   Thank you,  Genene Churn, CCC-SLP [phone]    Solid: Within functional limits Presentation: [place] 03/04/2017,4:26 PM

## 2017-03-04 NOTE — Progress Notes (Signed)
PROGRESS NOTE    Geovanie Vaughan  UEA:[id number] DOB: [birth year] DOA: 03/03/2017 PCP: Alycia Rossetti, MD    Brief Narrative:  83 year old male with a history of hypertension, prior CVA, dementia, presents to the hospital after having a seizure.  This was witnessed by family members.  He had a tonic-clonic seizure with urinary incontinence.  He was postictal after episode.  He was admitted to the hospital for further evaluation.  Chest x-ray indicated persistent pneumonia.  He is currently on IV antibiotics.   Assessment & Plan:   Principal Problem:   Seizure [facility]) Active Problems:   Essential hypertension   History of stroke   Dementia   Pneumonia   1. New-onset seizure.  Patient has a prior history of CVA.  He was loaded with Keppra in the emergency room and started on twice daily Keppra.  EEG has been ordered.  Further imaging with MRI/MRA to rule out CVA has also been requested.  2D echocardiogram and carotid Dopplers been done.  Will consult neurology.  Continue on aspirin therapy. 2. Left internal carotid artery stenosis.  Noted on carotid Dopplers to have 50-69% narrowing of left internal carotid artery.  CT Angio of the neck requested to further characterize this.  May need vascular surgery follow-up 3. Recurrent pneumonia.  Started on Zosyn.  Similar area of pneumonia noted on chest x-ray from 01/2016.  He completed a course of antibiotics at that time.  Since findings are persisting, we will further evaluate with CT chest to rule out underlying malignancy. 4. Hypertension.  Lisinopril currently on hold to allow for permissive hypertension 5. Dementia.  Continue Aricept 6. Elevated troponin.  Minimal elevation, likely related to demand ischemia.  Echocardiogram is unremarkable.   DVT prophylaxis: Lovenox Code Status: DNR Family Communication: Discussed with wife at the bedside) Disposition Plan: Discharge home once improved   Consultants:     Procedures:  Echo:- Normal LV  systolic function; mild proximal septal thickening;    mild diastolic dysfunction; mild MR and TR  Antimicrobials:   Zosyn 3/3>   Subjective: No significant shortness of breath.  No unilateral weakness or numbness.  Objective: Vitals:   03/03/17 2330 03/04/17 0036 03/04/17 0417 03/04/17 1453  BP: (!) 160/87 (!) 182/70 (!) 153/58 (!) 158/95  Pulse: 64 (!) 59 69 74  Resp: 14   16  Temp:  98.2 F (36.8 C) 97.7 F (36.5 C) 97.9 F (36.6 C)  TempSrc:  Oral Oral Oral  SpO2: 96% 97% 100% 96%  Height:        Intake/Output Summary (Last 24 hours) at 03/04/2017 1803 Last data filed at 03/04/2017 1747 Gross per 24 hour  Intake 820 ml  Output 500 ml  Net 320 ml   There were no vitals filed for this visit.  Examination:  General exam: Appears calm and comfortable  Respiratory system: Clear to auscultation. Respiratory effort normal. Cardiovascular system: S1 & S2 heard, RRR. No JVD, murmurs, rubs, gallops or clicks. No pedal edema. Gastrointestinal system: Abdomen is nondistended, soft and nontender. No organomegaly or masses felt. Normal bowel sounds heard. Central nervous system: Alert and oriented. No focal neurological deficits. Extremities: Symmetric 5 x 5 power. Skin: No rashes, lesions or ulcers Psychiatry: Judgement and insight appear normal. Mood & affect appropriate.     Data Reviewed: I have personally reviewed following labs and imaging studies  CBC: Recent Labs  Lab 03/03/17 2024 03/04/17 0613  WBC 5.3 8.3  NEUTROABS 3.7  --   HGB 13.0  11.7*  HCT 40.0 36.6*  MCV 94.1 93.8  PLT 226 818   Basic Metabolic Panel: Recent Labs  Lab 03/03/17 2024 03/04/17 0613  NA 139 138  K 4.7 3.9  CL 104 104  CO2 26 26  GLUCOSE 132* 90  BUN 18 18  CREATININE 0.85 0.89  CALCIUM 9.1 8.7*  MG 2.1  --    GFR: CrCl cannot be calculated (Unknown ideal weight.). Liver Function Tests: No results for input(s): AST, ALT, ALKPHOS, BILITOT, PROT, ALBUMIN in the last 168  hours. No results for input(s): LIPASE, AMYLASE in the last 168 hours. No results for input(s): AMMONIA in the last 168 hours. Coagulation Profile: No results for input(s): INR, PROTIME in the last 168 hours. Cardiac Enzymes: Recent Labs  Lab 03/03/17 2024 03/03/17 2349 03/04/17 0613 03/04/17 1219  TROPONINI 0.10* 0.08* 0.09* 0.10*   BNP (last 3 results) No results for input(s): PROBNP in the last 8760 hours. HbA1C: No results for input(s): HGBA1C in the last 72 hours. CBG: No results for input(s): GLUCAP in the last 168 hours. Lipid Profile: Recent Labs    03/04/17 0613  CHOL 114  HDL 43  LDLCALC 62  TRIG 45  CHOLHDL 2.7   Thyroid Function Tests: Recent Labs    03/03/17 2349  TSH 0.653   Anemia Panel: No results for input(s): VITAMINB12, FOLATE, FERRITIN, TIBC, IRON, RETICCTPCT in the last 72 hours. Sepsis Labs: Recent Labs  Lab 03/03/17 2349 03/04/17 0613  PROCALCITON <0.10 <0.10    No results found for this or any previous visit (from the past 240 hour(s)).       Radiology Studies: Dg Chest 2 View  Result Date: 03/03/2017 CLINICAL DATA:  Patient with recent flu and pneumonia. EXAM: CHEST  2 VIEW COMPARISON:  Chest radiograph 01/06/2017; 06/16/2015. FINDINGS: Monitoring leads overlie the patient. Stable cardiomegaly. Aortic atherosclerosis. Interval development of peripheral right mid lung and left lower lung patchy consolidative opacities. Probable small bilateral pleural effusions. Chronic changes within the left lower lung. Thoracic spine degenerative changes. IMPRESSION: Findings suggestive of possible right mid lung and left lower lung consolidation concerning for pneumonia in the appropriate clinical setting. Followup PA and lateral chest X-ray is recommended in 3-4 weeks following trial of antibiotic therapy to ensure resolution and exclude underlying malignancy. Electronically Signed   By: Lovey Newcomer M.D.   On: 03/03/2017 19:54   Ct Head Wo  Contrast  Result Date: 03/03/2017 CLINICAL DATA:  Patient found unresponsive at 1740 hours today. EXAM: CT HEAD WITHOUT CONTRAST TECHNIQUE: Contiguous axial images were obtained from the base of the skull through the vertex without intravenous contrast. COMPARISON:  Head CT scan 10/12/2012.  Brain MRI 08/13/2014. FINDINGS: Brain: No evidence of acute infarction, hemorrhage, hydrocephalus, extra-axial collection or mass lesion/mass effect. Cortical atrophy and chronic microvascular ischemic change are seen. Vascular: No hyperdense vessel or unexpected calcification. Skull: Intact. Sinuses/Orbits: Mucosal thickening is seen in all the imaged paranasal sinuses. Other: None. IMPRESSION: No acute abnormality. Atrophy and chronic microvascular ischemic disease. Sinus disease. Electronically Signed   By: Inge Rise M.D.   On: 03/03/2017 19:53   US Carotid Bilateral  Result Date: 03/04/2017 CLINICAL DATA:  Recent stroke. History of hypertension, hyperlipidemia and smoking. EXAM: BILATERAL CAROTID DUPLEX ULTRASOUND TECHNIQUE: Pearline Cables scale imaging, color Doppler and duplex ultrasound were performed of bilateral carotid and vertebral arteries in the neck. COMPARISON:  Adductor ultrasound - 10/13/2012 FINDINGS: Criteria: Quantification of carotid stenosis is based on velocity parameters that correlate the residual  internal carotid diameter with NASCET-based stenosis levels, using the diameter of the distal internal carotid lumen as the denominator for stenosis measurement. The following velocity measurements were obtained: RIGHT ICA:  104/18 cm/sec CCA:  202/54 cm/sec SYSTOLIC ICA/CCA RATIO:  0.6 DIASTOLIC ICA/CCA RATIO:  1.3 ECA:  146 cm/sec LEFT ICA:  158/28 cm/sec CCA:  270/62 cm/sec SYSTOLIC ICA/CCA RATIO:  1.2 DIASTOLIC ICA/CCA RATIO:  2.1 ECA:  134 cm/sec RIGHT CAROTID ARTERY: There is a moderate amount of eccentric mixed echogenic plaque within the right carotid bulb (images 16 and 18), extending to involve the  origin and proximal aspects of the right internal carotid artery (image 26), progressed compared to the 10/2012 examination, though again not resulting in elevated peak systolic velocities within the interrogated course the right internal carotid artery to suggest a hemodynamically significant stenosis. RIGHT VERTEBRAL ARTERY:  Antegrade Flow LEFT CAROTID ARTERY: There is a minimal to moderate amount of atherosclerotic plaque scattered throughout the left common carotid artery, morphologically similar to the 10/2012 examination. There is a moderate to large amount of eccentric mixed echogenic partially shadowing plaque within the left carotid bulb (image 54 and 57), extending to involve the origin and proximal aspect the left internal carotid artery (image 64), likely progressed compared to the 10/2012 examination and again resulting in elevated peak systolic velocities within the proximal aspect the left internal carotid artery. Greatest acquired peak systolic velocity with the proximal left ICA measures 158 cm/sec, previously 138 cm/sec. LEFT VERTEBRAL ARTERY:  Antegrade Flow IMPRESSION: 1. Interval progression of now moderate to large amount of left-sided atherosclerotic plaque results in elevated peak systolic velocities within the left internal carotid artery compatible with the 50 to 69% luminal narrowing range. Further evaluation with CTA could performed as clinically indicated. 2. Slight progression of now moderate amount of right-sided atherosclerotic plaque though again not definitely resulting in a hemodynamically significant stenosis. Electronically Signed   By: Sandi Mariscal M.D.   On: 03/04/2017 13:13        Scheduled Meds: . aspirin EC  162 mg Oral QHS  . cholecalciferol  1,000 Units Oral Daily  . citalopram  20 mg Oral Daily  . darifenacin  7.5 mg Oral Daily  . donepezil  10 mg Oral QHS  . enoxaparin (LOVENOX) injection  40 mg Subcutaneous Q24H  . fluticasone  1 spray Each Nare Daily  .  levETIRAcetam  500 mg Oral BID  . LORazepam  1 mg Oral QHS  . lubiprostone  24 mcg Oral Q breakfast  . mirtazapine  7.5 mg Oral QHS  . multivitamin with minerals  1 tablet Oral Daily  . pantoprazole  40 mg Oral Daily  . simvastatin  20 mg Oral q1800  . sodium chloride flush  3 mL Intravenous Q12H  . traZODone  100 mg Oral QHS   Continuous Infusions: . sodium chloride    . piperacillin-tazobactam (ZOSYN)  IV 3.375 g (03/04/17 1632)     LOS: 1 day    Time spent: 26mins    Kathie Dike, MD [facility] Pager [phone]  If 7PM-7AM, please contact night-coverage [email or web address] Password [facility] 03/04/2017, 6:03 PM

## 2017-03-04 NOTE — Progress Notes (Addendum)
ANTIBIOTIC CONSULT NOTE-Preliminary  Pharmacy Consult for zosyn Indication: pneumonia  Allergies  Allergen Reactions  . Aspirin Nausea And Vomiting    Tolerates 81mg  daily    Patient Measurements: Height: 5\' 9"  (175.3 cm) IBW/kg (Calculated) : 70.7 Adjusted Body Weight:   Vital Signs: Temp: 98.2 F (36.8 C) (03/03 0036) Temp Source: Oral (03/03 0036) BP: 182/70 (03/03 0036) Pulse Rate: 59 (03/03 0036)  Labs: Recent Labs    03/03/17 2024  WBC 5.3  HGB 13.0  PLT 226  CREATININE 0.85    CrCl cannot be calculated (Unknown ideal weight.).  No results for input(s): VANCOTROUGH, VANCOPEAK, VANCORANDOM, GENTTROUGH, GENTPEAK, GENTRANDOM, TOBRATROUGH, TOBRAPEAK, TOBRARND, AMIKACINPEAK, AMIKACINTROU, AMIKACIN in the last 72 hours.   Microbiology: No results found for this or any previous visit (from the past 720 hour(s)).  Medical History: Past Medical History:  Diagnosis Date  . Abnormality of gait 08/01/2013  . Arthritis   . Blindness of left eye    Posttraumatic  . Carotid artery disease ([place])   . Colon polyp   . Constipation   . Diaphragmatic hernia   . Disc disease, degenerative, cervical   . Diverticulitis   . ED (erectile dysfunction)   . Gastroparesis   . GERD (gastroesophageal reflux disease)   . Glaucoma   . Heart disease   . Hyperlipidemia   . Hypertension   . Insomnia   . Memory difficulty 08/04/2014  . Nicotine dependence   . Numbness and tingling of right arm 08/04/2014  . Osteoarthritis   . Overactive bladder   . Radiculopathy   . Shortness of breath    with activity  . Sleep apnea    does not use CPAP.  Tested maybe 5- 10 years ago  . Stroke [facility])    Left side weakness.  . Unilateral inguinal hernia     Medications:  Scheduled:  . aspirin EC  162 mg Oral QHS  . cholecalciferol  1,000 Units Oral Daily  . citalopram  20 mg Oral Daily  . darifenacin  7.5 mg Oral Daily  . donepezil  10 mg Oral QHS  . enoxaparin (LOVENOX) injection  40 mg  Subcutaneous Q24H  . fluticasone  1 spray Each Nare Daily  . levETIRAcetam  500 mg Oral BID  . LORazepam  1 mg Oral QHS  . lubiprostone  24 mcg Oral Q breakfast  . mirtazapine  7.5 mg Oral QHS  . multivitamin with minerals  1 tablet Oral Daily  . pantoprazole  40 mg Oral Daily  . predniSONE  10 mg Oral Q breakfast  . simvastatin  20 mg Oral q1800  . sodium chloride flush  3 mL Intravenous Q12H  . traZODone  100 mg Oral QHS   Infusions:  . sodium chloride    . piperacillin-tazobactam (ZOSYN)  IV 3.375 g (03/04/17 0049)   PRN: sodium chloride, acetaminophen **OR** acetaminophen, albuterol, hydrALAZINE, HYDROcodone-acetaminophen, LORazepam, nitroGLYCERIN, ondansetron **OR** ondansetron (ZOFRAN) IV, sodium chloride flush Anti-infectives (From admission, onward)   Start     Dose/Rate Route Frequency Ordered Stop   03/04/17 0030  piperacillin-tazobactam (ZOSYN) IVPB 3.375 g     3.375 g 12.5 mL/hr over 240 Minutes Intravenous Every 8 hours 03/04/17 0027        Assessment: 83 yo male presents to ED after seizure at home.  Patient had recently been treated for PNA. Startign Zosyn for aspiration coverage due to seizure.   Plan:  Preliminary review of pertinent patient information completed.  Protocol will be initiated  with dose(s) of zosyn 3.375 grams IV Q8.  Forestine Na clinical pharmacist will complete review during morning rounds to assess patient and finalize treatment regimen if needed.  [place], Tiffany Scarlett, RPH 03/04/2017,2:48 AM Cont zosyn as ordered.  F/u renal function, cultures and clinical course Excell Seltzer, PharmD

## 2017-03-04 NOTE — Plan of Care (Signed)
progressing 

## 2017-03-05 ENCOUNTER — Inpatient Hospital Stay (HOSPITAL_COMMUNITY): Payer: Medicare Other

## 2017-03-05 ENCOUNTER — Inpatient Hospital Stay (HOSPITAL_COMMUNITY)
Admit: 2017-03-05 | Discharge: 2017-03-05 | Disposition: A | Discharge status: Home / Self Care | Payer: Medicare Other | Attending: Internal Medicine | Admitting: Internal Medicine

## 2017-03-05 LAB — LEGIONELLA PNEUMOPHILA SEROGP 1 UR AG: L. PNEUMOPHILA SEROGP 1 UR AG: NEGATIVE

## 2017-03-05 LAB — PROCALCITONIN: Procalcitonin: <0.1 ng/mL

## 2017-03-05 MED ORDER — LISINOPRIL 10 MG PO TABS
10.0000 mg | ORAL_TABLET | Freq: Every day | ORAL | Status: DC
  Administered 2017-03-06: 10 mg via ORAL
  Filled 2017-03-05: qty 2

## 2017-03-05 MED ORDER — LEVETIRACETAM 250 MG PO TABS
250.0000 mg | ORAL_TABLET | Freq: Every morning | ORAL | Status: DC
  Administered 2017-03-06: 250 mg via ORAL
  Filled 2017-03-05 (×2): qty 1

## 2017-03-05 NOTE — Procedures (Signed)
Benjamin A. Merlene Laughter, MD     [email or web address]           HISTORY: This is a 83 year old male who presents with new onset seizures.  MEDICATIONS: Scheduled Meds: . aspirin EC  162 mg Oral QHS  . cholecalciferol  1,000 Units Oral Daily  . citalopram  20 mg Oral Daily  . darifenacin  7.5 mg Oral Daily  . donepezil  10 mg Oral QHS  . enoxaparin (LOVENOX) injection  40 mg Subcutaneous Q24H  . fluticasone  1 spray Each Nare Daily  . levETIRAcetam  500 mg Oral BID  . [START ON 03/06/2017] lisinopril  10 mg Oral Daily  . LORazepam  1 mg Oral QHS  . lubiprostone  24 mcg Oral Q breakfast  . mirtazapine  7.5 mg Oral QHS  . multivitamin with minerals  1 tablet Oral Daily  . pantoprazole  40 mg Oral Daily  . simvastatin  20 mg Oral q1800  . sodium chloride flush  3 mL Intravenous Q12H  . traZODone  100 mg Oral QHS   Continuous Infusions: . sodium chloride    . piperacillin-tazobactam (ZOSYN)  IV 3.375 g (03/05/17 1630)   PRN Meds:.sodium chloride, acetaminophen **OR** acetaminophen, albuterol, hydrALAZINE, HYDROcodone-acetaminophen, LORazepam, nitroGLYCERIN, ondansetron **OR** ondansetron (ZOFRAN) IV, sodium chloride flush  Prior to Admission medications   Medication Sig Start Date End Date Taking? Authorizing Provider  albuterol (PROVENTIL HFA;VENTOLIN HFA) 108 (90 Base) MCG/ACT inhaler Inhale 2 puffs into the lungs every 4 (four) hours as needed for wheezing or shortness of breath. 02/27/17  Yes [place], Modena Nunnery, MD  amoxicillin-clavulanate (AUGMENTIN) 875-125 MG tablet Take 1 tablet by mouth 2 (two) times daily. 02/27/17  Yes [place], Modena Nunnery, MD  aspirin EC 81 MG tablet Take 162 mg by mouth at bedtime.    Yes [provider]  cholecalciferol (VITAMIN D) 1000 units tablet TAKE 1 CAPSULE BY MOUTH ONCE DAILY. 01/22/17  Yes [place], Modena Nunnery, MD  citalopram (CELEXA) 20 MG tablet TAKE 1 TABLET BY MOUTH ONCE A DAY FOR DEPRESSION. 02/22/17  Yes [place], Modena Nunnery, MD  donepezil (ARICEPT) 10 MG tablet TAKE (1) TABLET BY MOUTH AT BEDTIME. 05/20/15  Yes Kathrynn Ducking, MD  fluticasone (FLONASE) 50 MCG/ACT nasal spray USE 1 OR 2 SPRAYS IN EACH NOSTRIL EVERY DAY FOR NASAL ALLERGIES. 02/22/17  Yes [place], Modena Nunnery, MD  HYDROcodone-acetaminophen (NORCO/VICODIN) 5-325 MG tablet TAKE 1 TABLET BY MOUTH THREE TIMES DAILY AS NEEDED FOR MODERATE PAIN 02/23/17  Yes [place], Modena Nunnery, MD  lisinopril (PRINIVIL,ZESTRIL) 10 MG tablet Take 10 mg by mouth daily.   Yes [provider]  LORazepam (ATIVAN) 1 MG tablet TAKE (1) TABLET BY MOUTH AT BEDTIME. 11/21/16  Yes [place], Modena Nunnery, MD  lubiprostone (AMITIZA) 24 MCG capsule Take 1 capsule (24 mcg total) by mouth daily with breakfast. MAY TAKE ADDITIONAL DOSE QD PRN. 01/04/17  Yes [place], Modena Nunnery, MD  mirtazapine (REMERON) 15 MG tablet TAKE 1/2 TABLET(7.5MG ) BY MOUTH AT BEDTIME FOR APPETITE. 02/14/17  Yes [place], Modena Nunnery, MD  Multiple Vitamin (MULTIVITAMIN WITH MINERALS) TABS tablet Take 1 tablet by mouth daily. 02/15/17  Yes , Modena Nunnery, MD  naproxen sodium (ALEVE) 220 MG tablet Take 440 mg by mouth daily as needed.   Yes [provider]  nitroGLYCERIN (NITROSTAT) 0.4 MG SL tablet Place 0.4 mg under the tongue every 5 (five) minutes as needed for chest pain.   Yes [provider]  omeprazole (PRILOSEC) 20 MG  capsule TAKE 1 CAPSULE BY MOUTH ONCE DAILY FOR RELFUX ESOPHAGITIS OR STOMACH ULCERS. 02/14/17  Yes [place], Modena Nunnery, MD  predniSONE (DELTASONE) 10 MG tablet Take 40mg  x 3 days,20mg  x 3 days, 10mg  x 3 days 02/27/17  Yes [place], Modena Nunnery, MD  traZODone (DESYREL) 100 MG tablet TAKE (1) TABLET BY MOUTH AT BEDTIME. 01/22/17  Yes [place], Modena Nunnery, MD  VESICARE 5 MG tablet TAKE ONE TABLET ONCE DAILY FOR BLADDER. 02/14/17  Yes [place], Modena Nunnery, MD      ANALYSIS: A 16 channel recording using standard 10 20 measurements is conducted for 22 minutes. There is a well-formed posterior dominant  rhythm of 7. 5-8 hertz which attenuates with eye opening. There is beta activity observing the frontal areas. Awake and drowsy activities are observed. Photic stimulation and hypoventilation are not conducted. The study is somewhat degraded with movement and myogenic artifact. Focal or lateral slowing are not observed. Epileptiform activities are not observed.   IMPRESSION: 1. This recording awake and drowsy states shows mild global slowing indicating a mild global encephalopathy. However, no epileptiform activities are observed.      Arneisha Kincannon A. Merlene Vaughan, M.D.  Diplomate, Tax adviser of Psychiatry and Neurology ( Neurology).

## 2017-03-05 NOTE — Progress Notes (Signed)
EEG completed, results pending. 

## 2017-03-05 NOTE — Progress Notes (Signed)
PROGRESS NOTE    [name]  RJJ:[id number] DOB: 1934-03-09 DOA: 03/03/2017 PCP: Alycia Rossetti, MD    Brief Narrative:  83 year old male with a history of hypertension, prior CVA, dementia, presents to the hospital after having a seizure.  This was witnessed by family members.  He had a tonic-clonic seizure with urinary incontinence.  He was postictal after episode.  He was admitted to the hospital for further evaluation.  Chest x-ray indicated persistent pneumonia.  He is currently on IV antibiotics.   Assessment & Plan:   Principal Problem:   Seizure [facility]) Active Problems:   Essential hypertension   History of stroke   Dementia   Pneumonia   1. New-onset seizure.  Patient has a prior history of CVA.  He was loaded with Keppra in the emergency room and started on twice daily Keppra.  EEG done with report pending.  MRI/MRA did not show any evidence of acute stroke.  2D echocardiogram was unremarkable.  Carotid Dopplers with results as below.  Neurology consulted.  Continue on aspirin therapy. 2. Left internal carotid artery stenosis.  Noted on carotid Dopplers to have 50-69% narrowing of left internal carotid artery.  CT Angio of the neck demonstrates stenosis of up to 60%.  Will refer patient to vascular surgery follow-up 3. Recurrent pneumonia.  Started on Zosyn.  Similar area of pneumonia noted on chest x-ray from 01/2016.  He completed a course of antibiotics at that time.  CT chest did not indicate any obvious malignancy, but did make note of bilateral pulmonary nodules measuring up to 8 mm.  These are felt to be indeterminate.  Will need follow-up as an outpatient after appropriate therapy. 4. Hypertension.  Restart on lisinopril 5. Dementia.  Continue Aricept 6. Elevated troponin.  Minimal elevation, likely related to demand ischemia.  Echocardiogram is unremarkable. 7. Dilated ascending aorta.  Incidental finding on CT chest, up to 4cm.  Follow-up with vascular surgery   DVT  prophylaxis: Lovenox Code Status: DNR Family Communication: Discussed with wife at the bedside Disposition Plan: Discharge home once evaluated by neurology   Consultants:   Neurology  Procedures:  Echo:- Normal LV systolic function; mild proximal septal thickening;    mild diastolic dysfunction; mild MR and TR  Antimicrobials:   Zosyn 3/3>   Subjective: No chest pain or shortness of breath.  No nausea or vomiting.  Objective: Vitals:   03/04/17 1831 03/04/17 2128 03/05/17 0509 03/05/17 1400  BP:  (!) 131/59 (!) 115/55 (!) 121/57  Pulse:  70 (!) 59 61  Resp:  16 16 17   Temp:  98.8 F (37.1 C) 97.6 F (36.4 C) 97.7 F (36.5 C)  TempSrc:  Oral Oral Oral  SpO2: 94% 92% 94% 95%  Height:        Intake/Output Summary (Last 24 hours) at 03/05/2017 1814 Last data filed at 03/05/2017 1300 Gross per 24 hour  Intake 480 ml  Output 350 ml  Net 130 ml   There were no vitals filed for this visit.  Examination:  General exam: Alert, awake, oriented x 3 Respiratory system: Clear to auscultation. Respiratory effort normal. Cardiovascular system:RRR. No murmurs, rubs, gallops. Gastrointestinal system: Abdomen is nondistended, soft and nontender. No organomegaly or masses felt. Normal bowel sounds heard. Central nervous system: Alert and oriented. No focal neurological deficits. Extremities: No C/C/E, +pedal pulses Skin: No rashes, lesions or ulcers Psychiatry: Judgement and insight appear normal. Mood & affect appropriate.   Data Reviewed: I have personally reviewed following labs  and imaging studies  CBC: Recent Labs  Lab 03/03/17 2024 03/04/17 0613  WBC 5.3 8.3  NEUTROABS 3.7  --   HGB 13.0 11.7*  HCT 40.0 36.6*  MCV 94.1 93.8  PLT 226 161   Basic Metabolic Panel: Recent Labs  Lab 03/03/17 2024 03/04/17 0613  NA 139 138  K 4.7 3.9  CL 104 104  CO2 26 26  GLUCOSE 132* 90  BUN 18 18  CREATININE 0.85 0.89  CALCIUM 9.1 8.7*  MG 2.1  --    GFR: CrCl  cannot be calculated (Unknown ideal weight.). Liver Function Tests: No results for input(s): AST, ALT, ALKPHOS, BILITOT, PROT, ALBUMIN in the last 168 hours. No results for input(s): LIPASE, AMYLASE in the last 168 hours. No results for input(s): AMMONIA in the last 168 hours. Coagulation Profile: No results for input(s): INR, PROTIME in the last 168 hours. Cardiac Enzymes: Recent Labs  Lab 03/03/17 2024 03/03/17 2349 03/04/17 0613 03/04/17 1219  TROPONINI 0.10* 0.08* 0.09* 0.10*   BNP (last 3 results) No results for input(s): PROBNP in the last 8760 hours. HbA1C: No results for input(s): HGBA1C in the last 72 hours. CBG: No results for input(s): GLUCAP in the last 168 hours. Lipid Profile: Recent Labs    03/04/17 0613  CHOL 114  HDL 43  LDLCALC 62  TRIG 45  CHOLHDL 2.7   Thyroid Function Tests: Recent Labs    03/03/17 2349  TSH 0.653   Anemia Panel: No results for input(s): VITAMINB12, FOLATE, FERRITIN, TIBC, IRON, RETICCTPCT in the last 72 hours. Sepsis Labs: Recent Labs  Lab 03/03/17 2349 03/04/17 0613 03/05/17 0620  PROCALCITON <0.10 <0.10 <0.10    No results found for this or any previous visit (from the past 240 hour(s)).       Radiology Studies: Dg Chest 2 View  Result Date: 03/03/2017 CLINICAL DATA:  Patient with recent flu and pneumonia. EXAM: CHEST  2 VIEW COMPARISON:  Chest radiograph 01/06/2017; 06/16/2015. FINDINGS: Monitoring leads overlie the patient. Stable cardiomegaly. Aortic atherosclerosis. Interval development of peripheral right mid lung and left lower lung patchy consolidative opacities. Probable small bilateral pleural effusions. Chronic changes within the left lower lung. Thoracic spine degenerative changes. IMPRESSION: Findings suggestive of possible right mid lung and left lower lung consolidation concerning for pneumonia in the appropriate clinical setting. Followup PA and lateral chest X-ray is recommended in 3-4 weeks following  trial of antibiotic therapy to ensure resolution and exclude underlying malignancy. Electronically Signed   By: Lovey Newcomer M.D.   On: 03/03/2017 19:54   Ct Head Wo Contrast  Result Date: 03/03/2017 CLINICAL DATA:  Patient found unresponsive at 1740 hours today. EXAM: CT HEAD WITHOUT CONTRAST TECHNIQUE: Contiguous axial images were obtained from the base of the skull through the vertex without intravenous contrast. COMPARISON:  Head CT scan 10/12/2012.  Brain MRI 08/13/2014. FINDINGS: Brain: No evidence of acute infarction, hemorrhage, hydrocephalus, extra-axial collection or mass lesion/mass effect. Cortical atrophy and chronic microvascular ischemic change are seen. Vascular: No hyperdense vessel or unexpected calcification. Skull: Intact. Sinuses/Orbits: Mucosal thickening is seen in all the imaged paranasal sinuses. Other: None. IMPRESSION: No acute abnormality. Atrophy and chronic microvascular ischemic disease. Sinus disease. Electronically Signed   By: Inge Rise M.D.   On: 03/03/2017 19:53   Ct Angio Neck W Or Wo Contrast  Result Date: 03/04/2017 CLINICAL DATA:  Initial evaluation for carotid stenosis, pneumonia. EXAM: CT ANGIOGRAPHY NECK CT CHEST WITH CONTRAST TECHNIQUE: Multidetector CT imaging of the neck  was performed using the standard protocol during bolus administration of intravenous contrast. Multiplanar CT image reconstructions and MIPs were obtained to evaluate the vascular anatomy. Carotid stenosis measurements (when applicable) are obtained utilizing NASCET criteria, using the distal internal carotid diameter as the denominator. Multi detector CT imaging of the chest was performed using the standard protocol following the administration of IV contrast. Coronal sagittal reformatted images provided. CONTRAST:  153mL ISOVUE-370 IOPAMIDOL (ISOVUE-370) INJECTION 76% COMPARISON:  Prior chest radiograph from 03/03/2017. FINDINGS: CTA NECK FINDINGS: Aortic arch: Visualized ascending aorta up  ectatic, better evaluated on concomitant chest CT. Visualized aorta otherwise within normal limits with normal branch pattern. Scattered atheromatous plaque about the arch itself. No flow-limiting stenosis about the origin of the great vessels. Left subclavian artery widely patent. Multifocal atheromatous irregularity with stenoses measuring up to approximately 50% present within the mid-distal right subclavian artery. Right carotid system: Right common carotid artery patent from its origin to the bifurcation without significant stenosis. Mild a centric mixed plaque about the right bifurcation/proximal right ICA with relatively mild narrowing of no more than 25% by NASCET criteria. Right ICA patent distally to the skull base without stenosis, dissection, or occlusion. Left carotid system: Left common carotid artery patent from its origin to the bifurcation without flow-limiting stenosis. Moderate predominantly calcified plaque about the left bifurcation/proximal left ICA. Resultant moderate stenosis of approximately 60% by NASCET criteria (series 7, image 122). Stenosis begins at the bifurcation and measures approximately 12 mm in length. Left ICA widely patent distally to the skull base without additional stenosis, dissection, or occlusion. Vertebral arteries: Both of the vertebral arteries arise from the subclavian arteries. Mild atheromatous narrowing at the origin of the vertebral arteries bilaterally. Skeleton: No acute osseous abnormality. No worrisome lytic or blastic osseous lesions. Patient is status post ACDF at C5 through C7. Multilevel facet arthrosis, worse on the left. Other neck: No acute soft tissue abnormality within the neck. Salivary glands within normal limits. Thyroid normal. No adenopathy. Diffuse mucosal thickening with air-fluid levels noted within the paranasal sinuses, suggesting acute pan sinusitis. CT CHEST FINDINGS: Ascending aorta ectatic measuring up to 4 cm in diameter. Mild to  moderate aortic atherosclerosis. Visualized aorta otherwise unremarkable. Mild cardiomegaly. No pericardial effusion. Three vessel coronary artery calcifications. Limited evaluation of the pulmonary arterial tree grossly unremarkable. Thyroid within normal limits. Enlarged 11 mm pretracheal node noted (series 13, image 52). Enlarged subcarinal node measures 16 mm in short axis (series 13, image 37). Findings are indeterminate, but may be reactive. Large 2 cm calcified prevascular nodes suggest prior granulomatous infection. No enlarged hilar or axillary adenopathy. Esophagus within normal limits. Tracheobronchial tree is patent. Lungs well inflated bilaterally. Small layering right pleural effusion. Mild bibasilar atelectatic changes. There are multifocal ground-glass opacities involving the bilateral upper lobes, right middle lobe, with more mild involvement within the superior aspects of the lower lobes bilaterally. Findings suspicious for multifocal infection/pneumonia. Component of underlying fibrotic lung changes with bronchiectasis noted. No significant pulmonary edema. No pneumothorax. Pleural base 8 mm left lower lobe nodule noted within the superior segment of left lower lobe (series 15, image 69). Additional 7 mm nodular density present within the peripheral right lower lobe (series 15, image 92). Findings are indeterminate. Visualized upper abdomen demonstrates no acute abnormality. Few scattered shotty subcentimeter gastrohepatic lymph nodes noted. No acute osseus abnormality. No worrisome lytic or blastic osseous lesions. IMPRESSION: 1. Moderate short-segment atheromatous stenosis measuring up to 60% by NASCET criteria at the proximal left ICA. 2. Mild  atheromatous stenosis of no more than 25% involving the proximal right ICA. 3. Widely patent vertebral arteries within the neck. 4. Acute pan sinusitis. CT CHEST IMPRESSION: 1. Patchy multifocal upper lobe predominant ground-glass opacities involving the  bilateral lungs as above, suspicious for multifocal pneumonia. Radiographic follow-up to resolution recommended. 2. Small layering right pleural effusion with associated atelectasis. 3. Bilateral pulmonary nodules measuring up to 8 mm as above, indeterminate, but may be infectious in nature. Attention at follow-up recommended. 4. Aortic atherosclerosis with diffuse 3 vessel coronary artery calcifications. 5. **An incidental finding of potential clinical significance has been found. Dilatation of the ascending aorta up to 4 cm in diameter. Recommend annual imaging followup by CTA or MRA. This recommendation follows 2010 ACCF/AHA/AATS/ACR/ASA/SCA/SCAI/SIR/STS/SVM Guidelines for the Diagnosis and Management of Patients with Thoracic Aortic Disease. Circulation. 2010; 121: C944-H675** Electronically Signed   By: Jeannine Boga M.D.   On: 03/04/2017 18:54   Ct Chest W Contrast  Result Date: 03/04/2017 CLINICAL DATA:  Initial evaluation for carotid stenosis, pneumonia. EXAM: CT ANGIOGRAPHY NECK CT CHEST WITH CONTRAST TECHNIQUE: Multidetector CT imaging of the neck was performed using the standard protocol during bolus administration of intravenous contrast. Multiplanar CT image reconstructions and MIPs were obtained to evaluate the vascular anatomy. Carotid stenosis measurements (when applicable) are obtained utilizing NASCET criteria, using the distal internal carotid diameter as the denominator. Multi detector CT imaging of the chest was performed using the standard protocol following the administration of IV contrast. Coronal sagittal reformatted images provided. CONTRAST:  170mL ISOVUE-370 IOPAMIDOL (ISOVUE-370) INJECTION 76% COMPARISON:  Prior chest radiograph from 03/03/2017. FINDINGS: CTA NECK FINDINGS: Aortic arch: Visualized ascending aorta up ectatic, better evaluated on concomitant chest CT. Visualized aorta otherwise within normal limits with normal branch pattern. Scattered atheromatous plaque about  the arch itself. No flow-limiting stenosis about the origin of the great vessels. Left subclavian artery widely patent. Multifocal atheromatous irregularity with stenoses measuring up to approximately 50% present within the mid-distal right subclavian artery. Right carotid system: Right common carotid artery patent from its origin to the bifurcation without significant stenosis. Mild a centric mixed plaque about the right bifurcation/proximal right ICA with relatively mild narrowing of no more than 25% by NASCET criteria. Right ICA patent distally to the skull base without stenosis, dissection, or occlusion. Left carotid system: Left common carotid artery patent from its origin to the bifurcation without flow-limiting stenosis. Moderate predominantly calcified plaque about the left bifurcation/proximal left ICA. Resultant moderate stenosis of approximately 60% by NASCET criteria (series 7, image 122). Stenosis begins at the bifurcation and measures approximately 12 mm in length. Left ICA widely patent distally to the skull base without additional stenosis, dissection, or occlusion. Vertebral arteries: Both of the vertebral arteries arise from the subclavian arteries. Mild atheromatous narrowing at the origin of the vertebral arteries bilaterally. Skeleton: No acute osseous abnormality. No worrisome lytic or blastic osseous lesions. Patient is status post ACDF at C5 through C7. Multilevel facet arthrosis, worse on the left. Other neck: No acute soft tissue abnormality within the neck. Salivary glands within normal limits. Thyroid normal. No adenopathy. Diffuse mucosal thickening with air-fluid levels noted within the paranasal sinuses, suggesting acute pan sinusitis. CT CHEST FINDINGS: Ascending aorta ectatic measuring up to 4 cm in diameter. Mild to moderate aortic atherosclerosis. Visualized aorta otherwise unremarkable. Mild cardiomegaly. No pericardial effusion. Three vessel coronary artery calcifications.  Limited evaluation of the pulmonary arterial tree grossly unremarkable. Thyroid within normal limits. Enlarged 11 mm pretracheal node noted (series 13,  image 52). Enlarged subcarinal node measures 16 mm in short axis (series 13, image 37). Findings are indeterminate, but may be reactive. Large 2 cm calcified prevascular nodes suggest prior granulomatous infection. No enlarged hilar or axillary adenopathy. Esophagus within normal limits. Tracheobronchial tree is patent. Lungs well inflated bilaterally. Small layering right pleural effusion. Mild bibasilar atelectatic changes. There are multifocal ground-glass opacities involving the bilateral upper lobes, right middle lobe, with more mild involvement within the superior aspects of the lower lobes bilaterally. Findings suspicious for multifocal infection/pneumonia. Component of underlying fibrotic lung changes with bronchiectasis noted. No significant pulmonary edema. No pneumothorax. Pleural base 8 mm left lower lobe nodule noted within the superior segment of left lower lobe (series 15, image 69). Additional 7 mm nodular density present within the peripheral right lower lobe (series 15, image 92). Findings are indeterminate. Visualized upper abdomen demonstrates no acute abnormality. Few scattered shotty subcentimeter gastrohepatic lymph nodes noted. No acute osseus abnormality. No worrisome lytic or blastic osseous lesions. IMPRESSION: 1. Moderate short-segment atheromatous stenosis measuring up to 60% by NASCET criteria at the proximal left ICA. 2. Mild atheromatous stenosis of no more than 25% involving the proximal right ICA. 3. Widely patent vertebral arteries within the neck. 4. Acute pan sinusitis. CT CHEST IMPRESSION: 1. Patchy multifocal upper lobe predominant ground-glass opacities involving the bilateral lungs as above, suspicious for multifocal pneumonia. Radiographic follow-up to resolution recommended. 2. Small layering right pleural effusion with  associated atelectasis. 3. Bilateral pulmonary nodules measuring up to 8 mm as above, indeterminate, but may be infectious in nature. Attention at follow-up recommended. 4. Aortic atherosclerosis with diffuse 3 vessel coronary artery calcifications. 5. **An incidental finding of potential clinical significance has been found. Dilatation of the ascending aorta up to 4 cm in diameter. Recommend annual imaging followup by CTA or MRA. This recommendation follows 2010 ACCF/AHA/AATS/ACR/ASA/SCA/SCAI/SIR/STS/SVM Guidelines for the Diagnosis and Management of Patients with Thoracic Aortic Disease. Circulation. 2010; 121: [id number]** Electronically Signed   By: Jeannine Boga M.D.   On: 03/04/2017 18:54   Mr Jodene Nam Head Wo Contrast  Result Date: 03/05/2017 CLINICAL DATA:  Seizure, new nontraumatic. Found unresponsive 2 days ago. Weakness and confusion. EXAM: MRI HEAD WITHOUT CONTRAST MRA HEAD WITHOUT CONTRAST TECHNIQUE: Multiplanar, multiecho pulse sequences of the brain and surrounding structures were obtained without intravenous contrast. Angiographic images of the head were obtained using MRA technique without contrast. COMPARISON:  Head CT and CTA from yesterday.  Brain MRI 08/20/2014 FINDINGS: MRI HEAD FINDINGS Brain: No acute infarction, hemorrhage, hydrocephalus, extra-axial collection or mass lesion. Moderate right more than left remote cerebellar infarct. Small remote lateral right medullary infarct. Generalized atrophy without progression since 2016. Chronic small vessel ischemia in the cerebral white matter that is confluent around the lateral ventricles, essentially stable. Vascular: Arterial findings below. Normal dural venous sinus flow voids. Skull and upper cervical spine: No evidence of marrow lesion. Sinuses/Orbits: Left cataract resection. Pansinusitis with mucosal thickening, secretions, and fluid levels. Other: Motion degraded. MRA HEAD FINDINGS Accounting for areas of artifactual signal drop there  is good patency of carotid vertebral and basilar arteries and the medium size branches. No evidence of vascular lesion. IMPRESSION: 1. No acute intracranial finding. 2. Atrophy and remote posterior fossa infarcts that are stable since 2016. 3. Pansinusitis. 4. Negative intracranial MRA. Electronically Signed   By: Monte Fantasia M.D.   On: 03/05/2017 09:14   Mr Brain Wo Contrast  Result Date: 03/05/2017 CLINICAL DATA:  Seizure, new nontraumatic. Found unresponsive 2 days ago.  Weakness and confusion. EXAM: MRI HEAD WITHOUT CONTRAST MRA HEAD WITHOUT CONTRAST TECHNIQUE: Multiplanar, multiecho pulse sequences of the brain and surrounding structures were obtained without intravenous contrast. Angiographic images of the head were obtained using MRA technique without contrast. COMPARISON:  Head CT and CTA from yesterday.  Brain MRI 08/20/2014 FINDINGS: MRI HEAD FINDINGS Brain: No acute infarction, hemorrhage, hydrocephalus, extra-axial collection or mass lesion. Moderate right more than left remote cerebellar infarct. Small remote lateral right medullary infarct. Generalized atrophy without progression since 2016. Chronic small vessel ischemia in the cerebral white matter that is confluent around the lateral ventricles, essentially stable. Vascular: Arterial findings below. Normal dural venous sinus flow voids. Skull and upper cervical spine: No evidence of marrow lesion. Sinuses/Orbits: Left cataract resection. Pansinusitis with mucosal thickening, secretions, and fluid levels. Other: Motion degraded. MRA HEAD FINDINGS Accounting for areas of artifactual signal drop there is good patency of carotid vertebral and basilar arteries and the medium size branches. No evidence of vascular lesion. IMPRESSION: 1. No acute intracranial finding. 2. Atrophy and remote posterior fossa infarcts that are stable since 2016. 3. Pansinusitis. 4. Negative intracranial MRA. Electronically Signed   By: Monte Fantasia M.D.   On:  03/05/2017 09:14   US Carotid Bilateral  Result Date: 03/04/2017 CLINICAL DATA:  Recent stroke. History of hypertension, hyperlipidemia and smoking. EXAM: BILATERAL CAROTID DUPLEX ULTRASOUND TECHNIQUE: Pearline Cables scale imaging, color Doppler and duplex ultrasound were performed of bilateral carotid and vertebral arteries in the neck. COMPARISON:  Adductor ultrasound - 10/13/2012 FINDINGS: Criteria: Quantification of carotid stenosis is based on velocity parameters that correlate the residual internal carotid diameter with NASCET-based stenosis levels, using the diameter of the distal internal carotid lumen as the denominator for stenosis measurement. The following velocity measurements were obtained: RIGHT ICA:  104/18 cm/sec CCA:  782/95 cm/sec SYSTOLIC ICA/CCA RATIO:  0.6 DIASTOLIC ICA/CCA RATIO:  1.3 ECA:  146 cm/sec LEFT ICA:  158/28 cm/sec CCA:  621/30 cm/sec SYSTOLIC ICA/CCA RATIO:  1.2 DIASTOLIC ICA/CCA RATIO:  2.1 ECA:  134 cm/sec RIGHT CAROTID ARTERY: There is a moderate amount of eccentric mixed echogenic plaque within the right carotid bulb (images 16 and 18), extending to involve the origin and proximal aspects of the right internal carotid artery (image 26), progressed compared to the 10/2012 examination, though again not resulting in elevated peak systolic velocities within the interrogated course the right internal carotid artery to suggest a hemodynamically significant stenosis. RIGHT VERTEBRAL ARTERY:  Antegrade Flow LEFT CAROTID ARTERY: There is a minimal to moderate amount of atherosclerotic plaque scattered throughout the left common carotid artery, morphologically similar to the 10/2012 examination. There is a moderate to large amount of eccentric mixed echogenic partially shadowing plaque within the left carotid bulb (image 54 and 57), extending to involve the origin and proximal aspect the left internal carotid artery (image 64), likely progressed compared to the 10/2012 examination and again  resulting in elevated peak systolic velocities within the proximal aspect the left internal carotid artery. Greatest acquired peak systolic velocity with the proximal left ICA measures 158 cm/sec, previously 138 cm/sec. LEFT VERTEBRAL ARTERY:  Antegrade Flow IMPRESSION: 1. Interval progression of now moderate to large amount of left-sided atherosclerotic plaque results in elevated peak systolic velocities within the left internal carotid artery compatible with the 50 to 69% luminal narrowing range. Further evaluation with CTA could performed as clinically indicated. 2. Slight progression of now moderate amount of right-sided atherosclerotic plaque though again not definitely resulting in a hemodynamically significant stenosis. Electronically  Signed   By: Sandi Mariscal M.D.   On: 03/04/2017 13:13        Scheduled Meds: . aspirin EC  162 mg Oral QHS  . cholecalciferol  1,000 Units Oral Daily  . citalopram  20 mg Oral Daily  . darifenacin  7.5 mg Oral Daily  . donepezil  10 mg Oral QHS  . enoxaparin (LOVENOX) injection  40 mg Subcutaneous Q24H  . fluticasone  1 spray Each Nare Daily  . levETIRAcetam  500 mg Oral BID  . LORazepam  1 mg Oral QHS  . lubiprostone  24 mcg Oral Q breakfast  . mirtazapine  7.5 mg Oral QHS  . multivitamin with minerals  1 tablet Oral Daily  . pantoprazole  40 mg Oral Daily  . simvastatin  20 mg Oral q1800  . sodium chloride flush  3 mL Intravenous Q12H  . traZODone  100 mg Oral QHS   Continuous Infusions: . sodium chloride    . piperacillin-tazobactam (ZOSYN)  IV 3.375 g (03/05/17 1630)     LOS: 2 days    Time spent: 3mins    Kathie Dike, MD [facility] Pager [phone]  If 7PM-7AM, please contact night-coverage [email or web address] Password [facility] 03/05/2017, 6:14 PM

## 2017-03-05 NOTE — Progress Notes (Signed)
Modified Barium Swallow Progress Note  Patient Details  Name: [name] MRN: [id number] Date of Birth: 05-Oct-1934  Today's Date: 03/05/2017  Modified Barium Swallow completed.  Full report located under Chart Review in the Imaging Section.  Brief recommendations include the following:  Clinical Impression  Pt presents with mild oropharyngeal phase dysphagia characterized by decreased oral bolus cohesiveness with piecemeal deglutition, min premature spillage and delay in swallow initiation with swallow trigger after filling valleculae and spilling to pyriforms with straw sips of thin. The above results in min lingual residue which trickles to valleculae post swallow and is generally cleared with a spontaneous repeat/dry swallow. 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 Pt with min pyriform residue post swallow with puree and solids near the entrance to UES and also at the level of previous ACDF (~C5/6). Pt with trace, flash penetration of thins when taking sequential straw sips, other wise no penetration or aspiration observed during the study.    Pt with a subjective c/o coughing when eating nuts and chips at home. When Pt was given the barium tablet, he swallowed it without liquid and it became delayed in the pyriforms and did not pass through UES until he followed with liquid wash. Suspect that this may also occur with nut pieces. Esophageal sweep was unremarkable, pill traversed to stomach after liquid wash. Pt was assessed with nectar thick liquids merely to ascertain which side of pharynx showed greater retention of bolus and it was shown to be the right side. After reviewing the study with Pt and his wife, the patient shared that he frequently eats peanuts throughout the day. Given the retention of pill in pyriforms during this study and Pt's subjective report of "getting  choked" on nuts and chips in setting of PNA, recommend D3/mech soft with thin liquids with use of aspiration precautions reviewed with Pt/family. Pt can resume a self regulated "regular texture" diet once at home with avoidance of nuts, cornbread, chips, and seeds. It was suggested that he try eating creamy peanut butter to replace the peanuts. Pt and wife in agreement. Pt to sit fully upright for all eating/drinking and remain upright for at least 30 minutes following, take small bites/sips, and swallow 2-3 times for each bite/sip. No further SLP services indicated at this time.    Swallow Evaluation Recommendations       SLP Diet Recommendations: Dysphagia 3 (Mech soft) solids;Thin liquid;Regular solids   Liquid Administration via: Cup;Straw   Medication Administration: Whole meds with liquid   Supervision: Patient able to self feed;Intermittent supervision to cue for compensatory strategies   Compensations: Small sips/bites;Multiple dry swallows after each bite/sip   Postural Changes: Remain semi-upright after after feeds/meals (Comment);Seated upright at 90 degrees   Oral Care Recommendations: Oral care BID;Staff/trained caregiver to provide oral care   Other Recommendations: Clarify dietary restrictions   Thank you,  Genene Churn, [place] 03/05/2017,10:35 PM

## 2017-03-05 NOTE — Consult Note (Signed)
[place] A. Merlene Laughter, MD     [email or web address]          [name] is an 83 y.o. male.   ASSESSMENT/PLAN: 1. New-onset seizure with a single unprovoked but does not meet the criteria for epilepsy: The patient likely has a low chance of developing recurrent seizures/epilepsy. It is reasonable to continue the Keppra which has been started but I would definitely reduce the dose to about 250 mg once daily. The patient should follow-up in the office in about 2 months.  2. Previous cerebellar infarcts: Agree with aspirin 160 mg.  3. Multifactorial severe gait impairment  4. Significant cognitive impairment at baseline    The patient is an 83 year old male who presents with new onset seizures. Patient was confused afterwards. There is a history of memory impairment at baseline. The family is at the bedside. The timing that he does have significant gait impairment and in fact does not ambulate. He does have severe degenerative changes of the cervical spine and underwent a fusion a few years ago. There is no prior history of seizures. The patient has been imaged several times because of significant gait impairment. He has been seen by neurology in [place] and myself for gait impairment. EMG have been unremarkable of the lower extremities. The review systems otherwise negative.    GENERAL: He is doing well and in no acute distress.  HEENT: There is severe right exotropia. The left pupil is 5 mm irregularly-shaped and nonreactive. The right is 3 mm and reactive.  ABDOMEN: soft  EXTREMITIES: No edema   BACK: This is normal  SKIN: Normal by inspection.    MENTAL STATUS: Alert and oriented - to hospital and the date but not the year. Speech, language and cognition are generally fair. Judgment and insight normal.   CRANIAL NERVES: The extra ocular movements are full, there is no significant nystagmus; visual fields are full; upper and lower facial muscles are normal  in strength and symmetric, there is no flattening of the nasolabial folds; tongue is midline; uvula is midline; shoulder elevation is normal.  MOTOR: Normal tone, bulk and strength; no pronator drift.  COORDINATION: Left finger to nose is normal, right finger to nose is normal, No rest tremor; no intention tremor; no postural tremor; no bradykinesia.  REFLEXES: Deep tendon reflexes are symmetrical and normal except the right knee where it is reduced. Plantar reflexes are flexor bilaterally.   SENSATION: Normal to light touch, temperature, and pain.      Blood pressure (!) 121/57, pulse 61, temperature 97.7 F (36.5 C), temperature source Oral, resp. rate 17, height 5' 9"  (1.753 m), SpO2 95 %.  Past Medical History:  Diagnosis Date  . Abnormality of gait 08/01/2013  . Arthritis   . Blindness of left eye    Posttraumatic  . Carotid artery disease ([place])   . Colon polyp   . Constipation   . Diaphragmatic hernia   . Disc disease, degenerative, cervical   . Diverticulitis   . ED (erectile dysfunction)   . Gastroparesis   . GERD (gastroesophageal reflux disease)   . Glaucoma   . Heart disease   . Hyperlipidemia   . Hypertension   . Insomnia   . Memory difficulty 08/04/2014  . Nicotine dependence   . Numbness and tingling of right arm 08/04/2014  . Osteoarthritis   . Overactive bladder   . Radiculopathy   . Shortness of breath    with activity  . Sleep apnea  does not use CPAP.  Tested maybe 5- 10 years ago  . Stroke [facility])    Left side weakness.  . Unilateral inguinal hernia     Past Surgical History:  Procedure Laterality Date  . ANTERIOR CERVICAL DECOMP/DISCECTOMY FUSION N/A 07/01/2012   Procedure: ANTERIOR CERVICAL DECOMPRESSION/DISCECTOMY FUSION CERVICAL FIVE-SIX [name] REMOVAL CERVICAL SIX-SEVEN;  Surgeon: [name], MD;  Location: [facility];  Service: Neurosurgery;  Laterality: N/A;  . CERVICAL FUSION    . COLONOSCOPY   08/04/2002     RMR: Normal  rectum/Pancolonic diverticula/Colonic polyps as described above, biopsied and/or snared  . COLONOSCOPY  60/08/2009   RMR: normal rectum/left and right sided diverticula/multiple colonic polyps. tubular adenomas. surveillance due 2014  . COLONOSCOPY WITH ESOPHAGOGASTRODUODENOSCOPY (EGD) N/A 12/18/2012   Dr. [name]:  Colonic diverticulosis. Multiple colonic polyps-hyperplastic polyps and tubular adenoma. Friable anal canal hemorrhoids. EGD with chronic atrophic gastritis  . ESOPHAGOGASTRODUODENOSCOPY  04/14/2002   BJS:EGBTDVVO'H' ring, status post dilation as described above/Hiatal hernia, focal antral erosions of uncertain clinical significance  . ESOPHAGOGASTRODUODENOSCOPY  06/09/2009   RMR: normal esophagus s/p dilator/small hiatal hernia otherwise normal  . HERNIA REPAIR Right    inguinal  . INGUINAL HERNIA REPAIR Left 04/15/2014   Procedure: LEFT INGUINAL HERNIORRHAPHY WITH MESH;  Surgeon: [name], MD;  Location: [facility];  Service: General;  Laterality: Left;  . INSERTION OF MESH Left 04/15/2014   Procedure: INSERTION OF MESH;  Surgeon: [name], MD;  Location: [facility];  Service: General;  Laterality: Left;    Family History  Problem Relation Age of Onset  . Cancer Mother   . Dementia Father   . Colon cancer Neg Hx     Social History:  reports that he has quit smoking. His smoking use included cigarettes. He has a 30.00 pack-year smoking history. he has never used smokeless tobacco. He reports that he does not drink alcohol or use drugs.  Allergies:  Allergies  Allergen Reactions  . Aspirin Nausea And Vomiting    Tolerates 45m daily    Medications: Prior to Admission medications   Medication Sig Start Date End Date Taking? Authorizing Provider  albuterol (PROVENTIL HFA;VENTOLIN HFA) 108 (90 Base) MCG/ACT inhaler Inhale 2 puffs into the lungs every 4 (four) hours as needed for wheezing or shortness of breath. 02/27/17  Yes [place], KModena Nunnery MD    amoxicillin-clavulanate (AUGMENTIN) 875-125 MG tablet Take 1 tablet by mouth 2 (two) times daily. 02/27/17  Yes [place], KModena Nunnery MD  aspirin EC 81 MG tablet Take 162 mg by mouth at bedtime.    Yes [provider]  cholecalciferol (VITAMIN D) 1000 units tablet TAKE 1 CAPSULE BY MOUTH ONCE DAILY. 01/22/17  Yes [place], KModena Nunnery MD  citalopram (CELEXA) 20 MG tablet TAKE 1 TABLET BY MOUTH ONCE A DAY FOR DEPRESSION. 02/22/17  Yes [place], KModena Nunnery MD  donepezil (ARICEPT) 10 MG tablet TAKE (1) TABLET BY MOUTH AT BEDTIME. 05/20/15  Yes WKathrynn Ducking MD  fluticasone (FLONASE) 50 MCG/ACT nasal spray USE 1 OR 2 SPRAYS IN EACH NOSTRIL EVERY DAY FOR NASAL ALLERGIES. 02/22/17  Yes [place], KModena Nunnery MD  HYDROcodone-acetaminophen (NORCO/VICODIN) 5-325 MG tablet TAKE 1 TABLET BY MOUTH THREE TIMES DAILY AS NEEDED FOR MODERATE PAIN 02/23/17  Yes , KModena Nunnery MD  lisinopril (PRINIVIL,ZESTRIL) 10 MG tablet Take 10 mg by mouth daily.   Yes [provider]  LORazepam (ATIVAN) 1 MG tablet TAKE (1) TABLET BY MOUTH AT BEDTIME. 11/21/16  Yes  [place], Modena Nunnery, MD  lubiprostone (AMITIZA) 24 MCG capsule Take 1 capsule (24 mcg total) by mouth daily with breakfast. MAY TAKE ADDITIONAL DOSE QD PRN. 01/04/17  Yes [place], Modena Nunnery, MD  mirtazapine (REMERON) 15 MG tablet TAKE 1/2 TABLET(7.5MG) BY MOUTH AT BEDTIME FOR APPETITE. 02/14/17  Yes [place], Modena Nunnery, MD  Multiple Vitamin (MULTIVITAMIN WITH MINERALS) TABS tablet Take 1 tablet by mouth daily. 02/15/17  Yes [place], Modena Nunnery, MD  naproxen sodium (ALEVE) 220 MG tablet Take 440 mg by mouth daily as needed.   Yes [provider]  nitroGLYCERIN (NITROSTAT) 0.4 MG SL tablet Place 0.4 mg under the tongue every 5 (five) minutes as needed for chest pain.   Yes [provider]  omeprazole (PRILOSEC) 20 MG capsule TAKE 1 CAPSULE BY MOUTH ONCE DAILY FOR RELFUX ESOPHAGITIS OR STOMACH ULCERS. 02/14/17  Yes [place], Modena Nunnery, MD  predniSONE  (DELTASONE) 10 MG tablet Take 34m x 3 days,258mx 3 days, 1079m 3 days 02/27/17  Yes [place], KawModena NunneryD  traZODone (DESYREL) 100 MG tablet TAKE (1) TABLET BY MOUTH AT BEDTIME. 01/22/17  Yes [place], KawModena NunneryD  VESICARE 5 MG tablet TAKE ONE TABLET ONCE DAILY FOR BLADDER. 02/14/17  Yes [place], KawModena NunneryD    Scheduled Meds: . aspirin EC  162 mg Oral QHS  . cholecalciferol  1,000 Units Oral Daily  . citalopram  20 mg Oral Daily  . darifenacin  7.5 mg Oral Daily  . donepezil  10 mg Oral QHS  . enoxaparin (LOVENOX) injection  40 mg Subcutaneous Q24H  . fluticasone  1 spray Each Nare Daily  . levETIRAcetam  500 mg Oral BID  . LORazepam  1 mg Oral QHS  . lubiprostone  24 mcg Oral Q breakfast  . mirtazapine  7.5 mg Oral QHS  . multivitamin with minerals  1 tablet Oral Daily  . pantoprazole  40 mg Oral Daily  . simvastatin  20 mg Oral q1800  . sodium chloride flush  3 mL Intravenous Q12H  . traZODone  100 mg Oral QHS   Continuous Infusions: . sodium chloride    . piperacillin-tazobactam (ZOSYN)  IV 3.375 g (03/05/17 1630)   PRN Meds:.sodium chloride, acetaminophen **OR** acetaminophen, albuterol, hydrALAZINE, HYDROcodone-acetaminophen, LORazepam, nitroGLYCERIN, ondansetron **OR** ondansetron (ZOFRAN) IV, sodium chloride flush     Results for orders placed or performed during the hospital encounter of 03/03/17 (from the past 48 hour(s))  Urinalysis, Routine w reflex microscopic     Status: None   Collection Time: 03/03/17  6:27 PM  Result Value Ref Range   Color, Urine YELLOW YELLOW   APPearance CLEAR CLEAR   Specific Gravity, Urine 1.012 1.005 - 1.030   pH 7.0 5.0 - 8.0   Glucose, UA NEGATIVE NEGATIVE mg/dL   Hgb urine dipstick NEGATIVE NEGATIVE   Bilirubin Urine NEGATIVE NEGATIVE   Ketones, ur NEGATIVE NEGATIVE mg/dL   Protein, ur NEGATIVE NEGATIVE mg/dL   Nitrite NEGATIVE NEGATIVE   Leukocytes, UA NEGATIVE NEGATIVE    Comment: Performed at [facility] [id number] Pneumophila Serogp 1 Ur Ag     Status: None   Collection Time: 03/03/17  6:28 PM  Result Value Ref Range   L. pneumophila Serogp 1 Ur Ag Negative Negative    Comment: (NOTE) Presumptive negative for L. pneumophila serogroup 1 antigen in urine, suggesting no recent or current infection. Legionnaires' disease cannot be ruled out since other serogroups and species  may also cause disease. Performed At: [facility] [place], [place] [id number] Rush Farmer MD UT:[id number]    Source of Sample URINE, CLEAN CATCH     Comment: Performed at [facility], [address], [place], [place] [address]  Strep pneumoniae urinary antigen     Status: None   Collection Time: 03/03/17  6:28 PM  Result Value Ref Range   Strep Pneumo Urinary Antigen NEGATIVE NEGATIVE    Comment: PERFORMED AT [facility]        Infection due to S. pneumoniae cannot be absolutely ruled out since the antigen present may be below the detection limit of the test.   CBC with Differential     Status: None   Collection Time: 03/03/17  8:24 PM  Result Value Ref Range   WBC 5.3 4.0 - 10.5 K/uL   RBC 4.25 4.22 - 5.81 MIL/uL   Hemoglobin 13.0 13.0 - 17.0 g/dL   HCT 40.0 39.0 - 52.0 %   MCV 94.1 78.0 - 100.0 fL   MCH 30.6 26.0 - 34.0 pg   MCHC 32.5 30.0 - 36.0 g/dL   RDW 14.3 11.5 - 15.5 %   Platelets 226 150 - 400 K/uL   Neutrophils Relative % 69 %   Neutro Abs 3.7 1.7 - 7.7 K/uL   Lymphocytes Relative 24 %   Lymphs Abs 1.3 0.7 - 4.0 K/uL   Monocytes Relative 7 %   Monocytes Absolute 0.4 0.1 - 1.0 K/uL   Eosinophils Relative 0 %   Eosinophils Absolute 0.0 0.0 - 0.7 K/uL   Basophils Relative 0 %   Basophils Absolute 0.0 0.0 - 0.1 K/uL    Comment: Performed at [facility], [address] [place], [place] [address]  Basic metabolic panel     Status: Abnormal   Collection Time: 03/03/17  8:24 PM  Result Value Ref Range   Sodium 139 135 - 145  mmol/L   Potassium 4.7 3.5 - 5.1 mmol/L   Chloride 104 101 - 111 mmol/L   CO2 26 22 - 32 mmol/L   Glucose, Bld 132 (H) 65 - 99 mg/dL   BUN 18 6 - 20 mg/dL   Creatinine, Ser 0.85 0.61 - 1.24 mg/dL   Calcium 9.1 8.9 - 10.3 mg/dL   GFR calc non Af Amer >60 >60 mL/min   GFR calc Af Amer >60 >60 mL/min    Comment: (NOTE) The eGFR has been calculated using the CKD EPI equation. This calculation has not been validated in all clinical situations. eGFR's persistently <60 mL/min signify possible Chronic Kidney Disease.    Anion gap 9 5 - 15    Comment: Performed at [facility], [address], [place], [place] [address]  Troponin I     Status: Abnormal   Collection Time: 03/03/17  8:24 PM  Result Value Ref Range   Troponin I 0.10 (HH) <0.03 ng/mL    Comment: CRITICAL RESULT CALLED TO, READ BACK BY AND VERIFIED WITH: TURNER,C. AT 2145 ON 03/03/2017 BY EVA Performed at [facility], [address] [place], [place] [address]   Magnesium     Status: None   Collection Time: 03/03/17  8:24 PM  Result Value Ref Range   Magnesium 2.1 1.7 - 2.4 mg/dL    Comment: Performed at [facility], [address], [place],  [address]  Troponin I     Status: Abnormal   Collection Time: 03/03/17 11:49 PM  Result Value Ref Range  Troponin I 0.08 (HH) <0.03 ng/mL    Comment: CRITICAL VALUE NOTED.  VALUE IS CONSISTENT WITH PREVIOUSLY REPORTED AND CALLED VALUE. Performed at [facility], [address], [place], [place] [address]   TSH     Status: None   Collection Time: 03/03/17 11:49 PM  Result Value Ref Range   TSH 0.653 0.350 - 4.500 uIU/mL    Comment: Performed by a 3rd Generation assay with a functional sensitivity of <=0.01 uIU/mL. Performed at [facility], [address] [place], [place] [address]   Procalcitonin     Status: None   Collection Time: 03/03/17 11:49 PM  Result Value Ref Range   Procalcitonin <0.10 ng/mL    Comment:        Interpretation: PCT (Procalcitonin) <= 0.5  ng/mL: Systemic infection (sepsis) is not likely. Local bacterial infection is possible. (NOTE)       Sepsis PCT Algorithm           Lower Respiratory Tract                                      Infection PCT Algorithm    ----------------------------     ----------------------------         PCT < 0.25 ng/mL                PCT < 0.10 ng/mL         Strongly encourage             Strongly discourage   discontinuation of antibiotics    initiation of antibiotics    ----------------------------     -----------------------------       PCT 0.25 - 0.50 ng/mL            PCT 0.10 - 0.25 ng/mL               OR       >80% decrease in PCT            Discourage initiation of                                            antibiotics      Encourage discontinuation           of antibiotics    ----------------------------     -----------------------------         PCT >= 0.50 ng/mL              PCT 0.26 - 0.50 ng/mL               AND        <80% decrease in PCT             Encourage initiation of                                             antibiotics       Encourage continuation           of antibiotics    ----------------------------     -----------------------------        PCT >= 0.50 ng/mL  PCT > 0.50 ng/mL               AND         increase in PCT                  Strongly encourage                                      initiation of antibiotics    Strongly encourage escalation           of antibiotics                                     -----------------------------                                           PCT <= 0.25 ng/mL                                                 OR                                        > 80% decrease in PCT                                     Discontinue / Do not initiate                                             antibiotics Performed at [facility], [address] [place], [place] [address]   Troponin I     Status: Abnormal   Collection Time:  03/04/17  6:13 AM  Result Value Ref Range   Troponin I 0.09 (HH) <0.03 ng/mL    Comment: CRITICAL VALUE NOTED.  VALUE IS CONSISTENT WITH PREVIOUSLY REPORTED AND CALLED VALUE. Performed at [facility], [address], [place], [place] [address]   Basic metabolic panel     Status: Abnormal   Collection Time: 03/04/17  6:13 AM  Result Value Ref Range   Sodium 138 135 - 145 mmol/L   Potassium 3.9 3.5 - 5.1 mmol/L    Comment: DELTA CHECK NOTED   Chloride 104 101 - 111 mmol/L   CO2 26 22 - 32 mmol/L   Glucose, Bld 90 65 - 99 mg/dL   BUN 18 6 - 20 mg/dL   Creatinine, Ser 0.89 0.61 - 1.24 mg/dL   Calcium 8.7 (L) 8.9 - 10.3 mg/dL   GFR calc non Af Amer >60 >60 mL/min   GFR calc Af Amer >60 >60 mL/min    Comment: (NOTE) The eGFR has been calculated using the CKD EPI equation. This calculation has not been validated in all clinical situations. eGFR's persistently <60 mL/min signify possible Chronic Kidney Disease.    Anion gap 8 5 - 15  Comment: Performed at [facility], [address], [place], [place] [address]  CBC     Status: Abnormal   Collection Time: 03/04/17  6:13 AM  Result Value Ref Range   WBC 8.3 4.0 - 10.5 K/uL   RBC 3.90 (L) 4.22 - 5.81 MIL/uL   Hemoglobin 11.7 (L) 13.0 - 17.0 g/dL   HCT 36.6 (L) 39.0 - 52.0 %   MCV 93.8 78.0 - 100.0 fL   MCH 30.0 26.0 - 34.0 pg   MCHC 32.0 30.0 - 36.0 g/dL   RDW 14.1 11.5 - 15.5 %   Platelets 220 150 - 400 K/uL    Comment: Performed at [facility], [address] [place], [place] [address]  Lipid panel     Status: None   Collection Time: 03/04/17  6:13 AM  Result Value Ref Range   Cholesterol 114 0 - 200 mg/dL   Triglycerides 45 <150 mg/dL   HDL 43 >40 mg/dL   Total CHOL/HDL Ratio 2.7 RATIO   VLDL 9 0 - 40 mg/dL   LDL Cholesterol 62 0 - 99 mg/dL    Comment:        Total Cholesterol/HDL:CHD Risk Coronary Heart Disease Risk Table                     Men   Women  1/2 Average Risk   3.4   3.3  Average Risk       5.0   4.4  2  X Average Risk   9.6   7.1  3 X Average Risk  23.4   11.0        Use the calculated Patient Ratio above and the CHD Risk Table to determine the patient's CHD Risk.        ATP III CLASSIFICATION (LDL):  <100     mg/dL   Optimal  100-129  mg/dL   Near or Above                    Optimal  130-159  mg/dL   Borderline  160-189  mg/dL   High  >190     mg/dL   Very High Performed at [facility] [place],  [address]   Procalcitonin     Status: None   Collection Time: 03/04/17  6:13 AM  Result Value Ref Range   Procalcitonin <0.10 ng/mL    Comment:        Interpretation: PCT (Procalcitonin) <= 0.5 ng/mL: Systemic infection (sepsis) is not likely. Local bacterial infection is possible. (NOTE)       Sepsis PCT Algorithm           Lower Respiratory Tract                                      Infection PCT Algorithm    ----------------------------     ----------------------------         PCT < 0.25 ng/mL                PCT < 0.10 ng/mL         Strongly encourage             Strongly discourage   discontinuation of antibiotics    initiation of antibiotics    ----------------------------     -----------------------------       PCT 0.25 - 0.50 ng/mL  PCT 0.10 - 0.25 ng/mL               OR       >80% decrease in PCT            Discourage initiation of                                            antibiotics      Encourage discontinuation           of antibiotics    ----------------------------     -----------------------------         PCT >= 0.50 ng/mL              PCT 0.26 - 0.50 ng/mL               AND        <80% decrease in PCT             Encourage initiation of                                             antibiotics       Encourage continuation           of antibiotics    ----------------------------     -----------------------------        PCT >= 0.50 ng/mL                  PCT > 0.50 ng/mL               AND         increase in PCT                   Strongly encourage                                      initiation of antibiotics    Strongly encourage escalation           of antibiotics                                     -----------------------------                                           PCT <= 0.25 ng/mL                                                 OR                                        > 80% decrease in PCT  Discontinue / Do not initiate                                             antibiotics Performed at [facility], [address], [place], [place] [address]   Troponin I     Status: Abnormal   Collection Time: 03/04/17 12:19 PM  Result Value Ref Range   Troponin I 0.10 (HH) <0.03 ng/mL    Comment: CRITICAL VALUE NOTED.  VALUE IS CONSISTENT WITH PREVIOUSLY REPORTED AND CALLED VALUE. Performed at [facility], [address], [place], [place] [address]   Procalcitonin     Status: None   Collection Time: 03/05/17  6:20 AM  Result Value Ref Range   Procalcitonin <0.10 ng/mL    Comment:        Interpretation: PCT (Procalcitonin) <= 0.5 ng/mL: Systemic infection (sepsis) is not likely. Local bacterial infection is possible. (NOTE)       Sepsis PCT Algorithm           Lower Respiratory Tract                                      Infection PCT Algorithm    ----------------------------     ----------------------------         PCT < 0.25 ng/mL                PCT < 0.10 ng/mL         Strongly encourage             Strongly discourage   discontinuation of antibiotics    initiation of antibiotics    ----------------------------     -----------------------------       PCT 0.25 - 0.50 ng/mL            PCT 0.10 - 0.25 ng/mL               OR       >80% decrease in PCT            Discourage initiation of                                            antibiotics      Encourage discontinuation           of antibiotics    ----------------------------     -----------------------------          PCT >= 0.50 ng/mL              PCT 0.26 - 0.50 ng/mL               AND        <80% decrease in PCT             Encourage initiation of                                             antibiotics       Encourage continuation           of antibiotics    ----------------------------     -----------------------------  PCT >= 0.50 ng/mL                  PCT > 0.50 ng/mL               AND         increase in PCT                  Strongly encourage                                      initiation of antibiotics    Strongly encourage escalation           of antibiotics                                     -----------------------------                                           PCT <= 0.25 ng/mL                                                 OR                                        > 80% decrease in PCT                                     Discontinue / Do not initiate                                             antibiotics Performed at [facility], [address] [place],  [address]     Studies/Results:   CTA NECK AND CT  IMPRESSION: 1. Moderate short-segment atheromatous stenosis measuring up to 60% by NASCET criteria at the proximal left ICA. 2. Mild atheromatous stenosis of no more than 25% involving the proximal right ICA. 3. Widely patent vertebral arteries within the neck. 4. Acute pan sinusitis.  CT CHEST IMPRESSION:  1. Patchy multifocal upper lobe predominant ground-glass opacities involving the bilateral lungs as above, suspicious for multifocal pneumonia. Radiographic follow-up to resolution recommended. 2. Small layering right pleural effusion with associated atelectasis. 3. Bilateral pulmonary nodules measuring up to 8 mm as above, indeterminate, but may be infectious in nature. Attention at follow-up recommended. 4. Aortic atherosclerosis with diffuse 3 vessel coronary artery calcifications. 5. **An incidental finding of potential clinical significance  has been found. Dilatation of the ascending aorta up to 4 cm in diameter. Recommend annual imaging followup by CTA or MRA   BRAIN MRI MRA FINDINGS: MRI HEAD FINDINGS  Brain: No acute infarction, hemorrhage, hydrocephalus, extra-axial collection or mass lesion.  Moderate right more than left remote cerebellar infarct. Small remote lateral right medullary infarct. Generalized atrophy without progression since 2016. Chronic  small vessel ischemia in the cerebral white matter that is confluent around the lateral ventricles, essentially stable.  Vascular: Arterial findings below. Normal dural venous sinus flow voids.  Skull and upper cervical spine: No evidence of marrow lesion.  Sinuses/Orbits: Left cataract resection.  Pansinusitis with mucosal thickening, secretions, and fluid levels.  Other: Motion degraded.  MRA HEAD FINDINGS  Accounting for areas of artifactual signal drop there is good patency of carotid vertebral and basilar arteries and the medium size branches. No evidence of vascular lesion.  IMPRESSION: 1. No acute intracranial finding. 2. Atrophy and remote posterior fossa infarcts that are stable since 2016. 3. Pansinusitis. 4. Negative intracranial MRA.    The brain MRI and MRA are reviewed in person. No acute lesions are seen on DWI. FLAIR and the T1 shows him multiple areas of encephalomalacia involving the cerebellum on both sides involving the right inferior middle section. No acute lesions are seen. 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 No hemorrhages appreciated. No evidence of cortical infarcts. MRA shows some luminal irregularity involving the distal MCA/M4 segments. There is also a absent right P-comm.       Cervical spine MRI 2017: IMPRESSION: The examination is extremely limited secondary to incomplete imaging. No axial images could be performed because the patient was unable to  complete the exam and refuses to return for additional imaging to completely evaluate the cervical spine.  1. At C7-T1 there is right paracentral disc protrusion abutting the ventral cervical spinal cord. Right facet arthropathy and uncovertebral degenerative changes resulting in severe right foraminal stenosis. Moderate left facet arthropathy and left uncovertebral degenerative change resulting in moderate left foraminal stenosis. 2. Anterior cervical fusion at C5-6 and C6-7. 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 Solid osseous bridging across the C6-7 disc space.    Vincenzo Stave A. Merlene Laughter, M.D.  Diplomate, Tax adviser of Psychiatry and Neurology ( Neurology). 03/05/2017, 6:13 PM

## 2017-03-06 DIAGNOSIS — I6522 Occlusion and stenosis of left carotid artery: Secondary | ICD-10-CM | POA: Diagnosis present

## 2017-03-06 DIAGNOSIS — R918 Other nonspecific abnormal finding of lung field: Secondary | ICD-10-CM

## 2017-03-06 DIAGNOSIS — R1310 Dysphagia, unspecified: Secondary | ICD-10-CM

## 2017-03-06 LAB — GLUCOSE, CAPILLARY: Glucose-Capillary: 90 mg/dL (ref 65–99)

## 2017-03-06 MED ORDER — AMOXICILLIN-POT CLAVULANATE 875-125 MG PO TABS: 1.0000 | ORAL_TABLET | Freq: Two times a day (BID) | ORAL | 0 refills | Status: DC

## 2017-03-06 MED ORDER — LEVETIRACETAM 250 MG PO TABS: 250.0000 mg | ORAL_TABLET | Freq: Every morning | ORAL | 0 refills | Status: DC

## 2017-03-06 NOTE — Care Management Important Message (Signed)
Important Message  Patient Details  Name: [name] MRN: [id number] Date of Birth: [birth year]   Medicare Important Message Given:  Yes    Joesiah Lonon, Chauncey Reading, RN 03/06/2017, 11:44 AM

## 2017-03-06 NOTE — Discharge Summary (Signed)
Physician Discharge Summary  [name] YWV:[id number] DOB: Sep 04, 1934 DOA: 03/03/2017  PCP: Alycia Rossetti, MD  Admit date: 03/03/2017 Discharge date: 03/06/2017  Admitted From: home Disposition:  home  Recommendations for Outpatient Follow-up:  1. Follow up with PCP in 1-2 weeks 2. Please obtain BMP/CBC in one week 3. Patient will need repeat imaging of his chest in 4-6 weeks to ensure resolution of bilateral pulmonary nodules 4. Follow-up with neurology in 2 months 5. Patient will be referred to vascular surgery for left ICA stenosis  Home Health: Equipment/Devices:  Discharge Condition: stable CODE STATUS: DNR Diet recommendation: dysphagia 3 with thin liquids  Brief/Interim Summary: 83 year old male with a history of hypertension, prior CVA, dementia, presents to the hospital after having a seizure.  This was witnessed by family members.  He had a tonic-clonic seizure with urinary incontinence.  He was postictal after episode.  He was admitted to the hospital for further evaluation.  Chest x-ray indicated persistent pneumonia.   Discharge Diagnoses:  Principal Problem:   Seizure [facility]) Active Problems:   Essential hypertension   History of stroke   Dementia   Pneumonia   Internal carotid artery stenosis, left   Dysphagia   Pulmonary nodules  1. New-onset seizure.  Patient has a prior history of CVA.  He was loaded with Keppra in the emergency room and started on twice daily Keppra.  EEG did not show any epileptiform discharges.  MRI/MRA did not show any evidence of infarct.  2D echocardiogram was unremarkable.  Carotid Dopplers with results as below.  Neurology evaluated the patient and agreed with continuing Windber for now.  He will be followed up in the next 2 months. 2. Left internal carotid artery stenosis.  Noted on carotid Dopplers to have 50-69% narrowing of left internal carotid artery.  CT Angio of the neck demonstrated stenosis of up to 60%.    Referral has been made  for outpatient vascular surgery follow-up.  Continue on aspirin.  LDL acceptable range 3. Recurrent pneumonia.  Started on Zosyn.  Similar area of pneumonia noted on chest x-ray from 01/2016.  He completed a course of antibiotics at that time.    CT chest did not indicate any obvious malignancy, but did make note of bilateral pulmonary nodules measuring up to 8 mm.  These were felt to be indeterminate.  He will need repeat imaging of his chest in the next 4-6 weeks.  Will continue on a course of antibiotics.   4. Dysphagia.  Due to recurrent pneumonia, he was evaluated by speech therapy.  It was felt that given the retention of barium pill in pyriforms during the study, and patient subjective report of "getting choked" on nuts and chips, it was recommended that patient continue on dysphagia 3/mechanical soft diet with thin liquids with use of aspiration precautions. 5. Hypertension.    Stable on lisinopril 6. Dementia.  Continue Aricept 7. Elevated troponin.  Minimal elevation, likely related to demand ischemia.  Echocardiogram is unremarkable. 8. Dilated ascending aorta.  Incidental finding on CT chest, up to 4 cm.  Follow-up with vascular surgery.     Discharge Instructions  Discharge Instructions    Ambulatory referral to Vascular Surgery   Complete by:  As directed    Patient with left ICA stenosis. He does not have a preference for provider. Please set up with next available appointment   Diet - low sodium heart healthy   Complete by:  As directed    Increase activity slowly  Complete by:  As directed      Allergies as of 03/06/2017      Reactions   Aspirin Nausea And Vomiting   Tolerates 81mg  daily      Medication List    STOP taking these medications   predniSONE 10 MG tablet Commonly known as:  DELTASONE     TAKE these medications   albuterol 108 (90 Base) MCG/ACT inhaler Commonly known as:  PROVENTIL HFA;VENTOLIN HFA Inhale 2 puffs into the lungs every 4 (four) hours as  needed for wheezing or shortness of breath.   amoxicillin-clavulanate 875-125 MG tablet Commonly known as:  AUGMENTIN Take 1 tablet by mouth 2 (two) times daily.   aspirin EC 81 MG tablet Take 162 mg by mouth at bedtime.   cholecalciferol 1000 units tablet Commonly known as:  VITAMIN D TAKE 1 CAPSULE BY MOUTH ONCE DAILY.   citalopram 20 MG tablet Commonly known as:  CELEXA TAKE 1 TABLET BY MOUTH ONCE A DAY FOR DEPRESSION.   donepezil 10 MG tablet Commonly known as:  ARICEPT TAKE (1) TABLET BY MOUTH AT BEDTIME.   fluticasone 50 MCG/ACT nasal spray Commonly known as:  FLONASE USE 1 OR 2 SPRAYS IN EACH NOSTRIL EVERY DAY FOR NASAL ALLERGIES.   HYDROcodone-acetaminophen 5-325 MG tablet Commonly known as:  NORCO/VICODIN TAKE 1 TABLET BY MOUTH THREE TIMES DAILY AS NEEDED FOR MODERATE PAIN   levETIRAcetam 250 MG tablet Commonly known as:  KEPPRA Take 1 tablet (250 mg total) by mouth every morning.   lisinopril 10 MG tablet Commonly known as:  PRINIVIL,ZESTRIL Take 10 mg by mouth daily.   LORazepam 1 MG tablet Commonly known as:  ATIVAN TAKE (1) TABLET BY MOUTH AT BEDTIME.   lubiprostone 24 MCG capsule Commonly known as:  AMITIZA Take 1 capsule (24 mcg total) by mouth daily with breakfast. MAY TAKE ADDITIONAL DOSE QD PRN.   mirtazapine 15 MG tablet Commonly known as:  REMERON TAKE 1/2 TABLET(7.5MG ) BY MOUTH AT BEDTIME FOR APPETITE.   multivitamin with minerals Tabs tablet Take 1 tablet by mouth daily.   naproxen sodium 220 MG tablet Commonly known as:  ALEVE Take 440 mg by mouth daily as needed.   nitroGLYCERIN 0.4 MG SL tablet Commonly known as:  NITROSTAT Place 0.4 mg under the tongue every 5 (five) minutes as needed for chest pain.   omeprazole 20 MG capsule Commonly known as:  PRILOSEC TAKE 1 CAPSULE BY MOUTH ONCE DAILY FOR RELFUX ESOPHAGITIS OR STOMACH ULCERS.   traZODone 100 MG tablet Commonly known as:  DESYREL TAKE (1) TABLET BY MOUTH AT BEDTIME.    VESICARE 5 MG tablet Generic drug:  solifenacin TAKE ONE TABLET ONCE DAILY FOR BLADDER.      Follow-up Information    Phillips Odor, MD On 05/07/2017.   Specialty:  Neurology Why:  at 10:00 am Contact information: [address] [place] [address] [phone]          Allergies  Allergen Reactions  . Aspirin Nausea And Vomiting    Tolerates 81mg  daily    Consultations:  Neurology  Speech therapy   Procedures/Studies: Dg Chest 2 View  Result Date: 03/03/2017 CLINICAL DATA:  Patient with recent flu and pneumonia. EXAM: CHEST  2 VIEW COMPARISON:  Chest radiograph 01/06/2017; 06/16/2015. FINDINGS: Monitoring leads overlie the patient. Stable cardiomegaly. Aortic atherosclerosis. Interval development of peripheral right mid lung and left lower lung patchy consolidative opacities. Probable small bilateral pleural effusions. Chronic changes within the left lower lung. Thoracic spine  degenerative changes. IMPRESSION: Findings suggestive of possible right mid lung and left lower lung consolidation concerning for pneumonia in the appropriate clinical setting. Followup PA and lateral chest X-ray is recommended in 3-4 weeks following trial of antibiotic therapy to ensure resolution and exclude underlying malignancy. Electronically Signed   By: Lovey Newcomer M.D.   On: 03/03/2017 19:54   Ct Head Wo Contrast  Result Date: 03/03/2017 CLINICAL DATA:  Patient found unresponsive at 1740 hours today. EXAM: CT HEAD WITHOUT CONTRAST TECHNIQUE: Contiguous axial images were obtained from the base of the skull through the vertex without intravenous contrast. COMPARISON:  Head CT scan 10/12/2012.  Brain MRI 08/13/2014. FINDINGS: Brain: No evidence of acute infarction, hemorrhage, hydrocephalus, extra-axial collection or mass lesion/mass effect. Cortical atrophy and chronic microvascular ischemic change are seen. Vascular: No hyperdense vessel or unexpected calcification. Skull: Intact.  Sinuses/Orbits: Mucosal thickening is seen in all the imaged paranasal sinuses. Other: None. IMPRESSION: No acute abnormality. Atrophy and chronic microvascular ischemic disease. Sinus disease. Electronically Signed   By: Inge Rise M.D.   On: 03/03/2017 19:53   Ct Angio Neck W Or Wo Contrast  Result Date: 03/04/2017 CLINICAL DATA:  Initial evaluation for carotid stenosis, pneumonia. EXAM: CT ANGIOGRAPHY NECK CT CHEST WITH CONTRAST TECHNIQUE: Multidetector CT imaging of the neck was performed using the standard protocol during bolus administration of intravenous contrast. Multiplanar CT image reconstructions and MIPs were obtained to evaluate the vascular anatomy. Carotid stenosis measurements (when applicable) are obtained utilizing NASCET criteria, using the distal internal carotid diameter as the denominator. Multi detector CT imaging of the chest was performed using the standard protocol following the administration of IV contrast. Coronal sagittal reformatted images provided. CONTRAST:  152mL ISOVUE-370 IOPAMIDOL (ISOVUE-370) INJECTION 76% COMPARISON:  Prior chest radiograph from 03/03/2017. FINDINGS: CTA NECK FINDINGS: Aortic arch: Visualized ascending aorta up ectatic, better evaluated on concomitant chest CT. Visualized aorta otherwise within normal limits with normal branch pattern. Scattered atheromatous plaque about the arch itself. No flow-limiting stenosis about the origin of the great vessels. Left subclavian artery widely patent. Multifocal atheromatous irregularity with stenoses measuring up to approximately 50% present within the mid-distal right subclavian artery. Right carotid system: Right common carotid artery patent from its origin to the bifurcation without significant stenosis. Mild a centric mixed plaque about the right bifurcation/proximal right ICA with relatively mild narrowing of no more than 25% by NASCET criteria. Right ICA patent distally to the skull base without stenosis,  dissection, or occlusion. Left carotid system: Left common carotid artery patent from its origin to the bifurcation without flow-limiting stenosis. Moderate predominantly calcified plaque about the left bifurcation/proximal left ICA. Resultant moderate stenosis of approximately 60% by NASCET criteria (series 7, image 122). Stenosis begins at the bifurcation and measures approximately 12 mm in length. Left ICA widely patent distally to the skull base without additional stenosis, dissection, or occlusion. Vertebral arteries: Both of the vertebral arteries arise from the subclavian arteries. Mild atheromatous narrowing at the origin of the vertebral arteries bilaterally. Skeleton: No acute osseous abnormality. No worrisome lytic or blastic osseous lesions. Patient is status post ACDF at C5 through C7. Multilevel facet arthrosis, worse on the left. Other neck: No acute soft tissue abnormality within the neck. Salivary glands within normal limits. Thyroid normal. No adenopathy. Diffuse mucosal thickening with air-fluid levels noted within the paranasal sinuses, suggesting acute pan sinusitis. CT CHEST FINDINGS: Ascending aorta ectatic measuring up to 4 cm in diameter. Mild to moderate aortic atherosclerosis. Visualized aorta otherwise unremarkable.  Mild cardiomegaly. No pericardial effusion. Three vessel coronary artery calcifications. Limited evaluation of the pulmonary arterial tree grossly unremarkable. Thyroid within normal limits. Enlarged 11 mm pretracheal node noted (series 13, image 52). Enlarged subcarinal node measures 16 mm in short axis (series 13, image 37). Findings are indeterminate, but may be reactive. Large 2 cm calcified prevascular nodes suggest prior granulomatous infection. No enlarged hilar or axillary adenopathy. Esophagus within normal limits. Tracheobronchial tree is patent. Lungs well inflated bilaterally. Small layering right pleural effusion. Mild bibasilar atelectatic changes. There are  multifocal ground-glass opacities involving the bilateral upper lobes, right middle lobe, with more mild involvement within the superior aspects of the lower lobes bilaterally. Findings suspicious for multifocal infection/pneumonia. Component of underlying fibrotic lung changes with bronchiectasis noted. No significant pulmonary edema. No pneumothorax. Pleural base 8 mm left lower lobe nodule noted within the superior segment of left lower lobe (series 15, image 69). Additional 7 mm nodular density present within the peripheral right lower lobe (series 15, image 92). Findings are indeterminate. Visualized upper abdomen demonstrates no acute abnormality. Few scattered shotty subcentimeter gastrohepatic lymph nodes noted. No acute osseus abnormality. No worrisome lytic or blastic osseous lesions. IMPRESSION: 1. Moderate short-segment atheromatous stenosis measuring up to 60% by NASCET criteria at the proximal left ICA. 2. Mild atheromatous stenosis of no more than 25% involving the proximal right ICA. 3. Widely patent vertebral arteries within the neck. 4. Acute pan sinusitis. CT CHEST IMPRESSION: 1. Patchy multifocal upper lobe predominant ground-glass opacities involving the bilateral lungs as above, suspicious for multifocal pneumonia. Radiographic follow-up to resolution recommended. 2. Small layering right pleural effusion with associated atelectasis. 3. Bilateral pulmonary nodules measuring up to 8 mm as above, indeterminate, but may be infectious in nature. Attention at follow-up recommended. 4. Aortic atherosclerosis with diffuse 3 vessel coronary artery calcifications. 5. **An incidental finding of potential clinical significance has been found. Dilatation of the ascending aorta up to 4 cm in diameter. Recommend annual imaging followup by CTA or MRA. This recommendation follows 2010 ACCF/AHA/AATS/ACR/ASA/SCA/SCAI/SIR/STS/SVM Guidelines for the Diagnosis and Management of Patients with Thoracic Aortic Disease.  Circulation. 2010; 121: [id number]** Electronically Signed   By: Jeannine Boga M.D.   On: 03/04/2017 18:54   Ct Chest W Contrast  Result Date: 03/04/2017 CLINICAL DATA:  Initial evaluation for carotid stenosis, pneumonia. EXAM: CT ANGIOGRAPHY NECK CT CHEST WITH CONTRAST TECHNIQUE: Multidetector CT imaging of the neck was performed using the standard protocol during bolus administration of intravenous contrast. Multiplanar CT image reconstructions and MIPs were obtained to evaluate the vascular anatomy. Carotid stenosis measurements (when applicable) are obtained utilizing NASCET criteria, using the distal internal carotid diameter as the denominator. Multi detector CT imaging of the chest was performed using the standard protocol following the administration of IV contrast. Coronal sagittal reformatted images provided. CONTRAST:  167mL ISOVUE-370 IOPAMIDOL (ISOVUE-370) INJECTION 76% COMPARISON:  Prior chest radiograph from 03/03/2017. FINDINGS: CTA NECK FINDINGS: Aortic arch: Visualized ascending aorta up ectatic, better evaluated on concomitant chest CT. Visualized aorta otherwise within normal limits with normal branch pattern. Scattered atheromatous plaque about the arch itself. No flow-limiting stenosis about the origin of the great vessels. Left subclavian artery widely patent. Multifocal atheromatous irregularity with stenoses measuring up to approximately 50% present within the mid-distal right subclavian artery. Right carotid system: Right common carotid artery patent from its origin to the bifurcation without significant stenosis. Mild a centric mixed plaque about the right bifurcation/proximal right ICA with relatively mild narrowing of no more than 25%  by NASCET criteria. Right ICA patent distally to the skull base without stenosis, dissection, or occlusion. Left carotid system: Left common carotid artery patent from its origin to the bifurcation without flow-limiting stenosis. Moderate  predominantly calcified plaque about the left bifurcation/proximal left ICA. Resultant moderate stenosis of approximately 60% by NASCET criteria (series 7, image 122). Stenosis begins at the bifurcation and measures approximately 12 mm in length. Left ICA widely patent distally to the skull base without additional stenosis, dissection, or occlusion. Vertebral arteries: Both of the vertebral arteries arise from the subclavian arteries. Mild atheromatous narrowing at the origin of the vertebral arteries bilaterally. Skeleton: No acute osseous abnormality. No worrisome lytic or blastic osseous lesions. Patient is status post ACDF at C5 through C7. Multilevel facet arthrosis, worse on the left. Other neck: No acute soft tissue abnormality within the neck. Salivary glands within normal limits. Thyroid normal. No adenopathy. Diffuse mucosal thickening with air-fluid levels noted within the paranasal sinuses, suggesting acute pan sinusitis. CT CHEST FINDINGS: Ascending aorta ectatic measuring up to 4 cm in diameter. Mild to moderate aortic atherosclerosis. Visualized aorta otherwise unremarkable. Mild cardiomegaly. No pericardial effusion. Three vessel coronary artery calcifications. Limited evaluation of the pulmonary arterial tree grossly unremarkable. Thyroid within normal limits. Enlarged 11 mm pretracheal node noted (series 13, image 52). Enlarged subcarinal node measures 16 mm in short axis (series 13, image 37). Findings are indeterminate, but may be reactive. Large 2 cm calcified prevascular nodes suggest prior granulomatous infection. No enlarged hilar or axillary adenopathy. Esophagus within normal limits. Tracheobronchial tree is patent. Lungs well inflated bilaterally. Small layering right pleural effusion. Mild bibasilar atelectatic changes. There are multifocal ground-glass opacities involving the bilateral upper lobes, right middle lobe, with more mild involvement within the superior aspects of the lower  lobes bilaterally. Findings suspicious for multifocal infection/pneumonia. Component of underlying fibrotic lung changes with bronchiectasis noted. No significant pulmonary edema. No pneumothorax. Pleural base 8 mm left lower lobe nodule noted within the superior segment of left lower lobe (series 15, image 69). Additional 7 mm nodular density present within the peripheral right lower lobe (series 15, image 92). Findings are indeterminate. Visualized upper abdomen demonstrates no acute abnormality. Few scattered shotty subcentimeter gastrohepatic lymph nodes noted. No acute osseus abnormality. No worrisome lytic or blastic osseous lesions. IMPRESSION: 1. Moderate short-segment atheromatous stenosis measuring up to 60% by NASCET criteria at the proximal left ICA. 2. Mild atheromatous stenosis of no more than 25% involving the proximal right ICA. 3. Widely patent vertebral arteries within the neck. 4. Acute pan sinusitis. CT CHEST IMPRESSION: 1. Patchy multifocal upper lobe predominant ground-glass opacities involving the bilateral lungs as above, suspicious for multifocal pneumonia. Radiographic follow-up to resolution recommended. 2. Small layering right pleural effusion with associated atelectasis. 3. Bilateral pulmonary nodules measuring up to 8 mm as above, indeterminate, but may be infectious in nature. Attention at follow-up recommended. 4. Aortic atherosclerosis with diffuse 3 vessel coronary artery calcifications. 5. **An incidental finding of potential clinical significance has been found. Dilatation of the ascending aorta up to 4 cm in diameter. Recommend annual imaging followup by CTA or MRA. This recommendation follows 2010 ACCF/AHA/AATS/ACR/ASA/SCA/SCAI/SIR/STS/SVM Guidelines for the Diagnosis and Management of Patients with Thoracic Aortic Disease. Circulation. 2010; 121: [id number]** Electronically Signed   By: Jeannine Boga M.D.   On: 03/04/2017 18:54   Mr Jodene Nam Head Wo Contrast  Result Date:  03/05/2017 CLINICAL DATA:  Seizure, new nontraumatic. Found unresponsive 2 days ago. Weakness and confusion. EXAM: MRI HEAD WITHOUT  CONTRAST MRA HEAD WITHOUT CONTRAST TECHNIQUE: Multiplanar, multiecho pulse sequences of the brain and surrounding structures were obtained without intravenous contrast. Angiographic images of the head were obtained using MRA technique without contrast. COMPARISON:  Head CT and CTA from yesterday.  Brain MRI 08/20/2014 FINDINGS: MRI HEAD FINDINGS Brain: No acute infarction, hemorrhage, hydrocephalus, extra-axial collection or mass lesion. Moderate right more than left remote cerebellar infarct. Small remote lateral right medullary infarct. Generalized atrophy without progression since 2016. Chronic small vessel ischemia in the cerebral white matter that is confluent around the lateral ventricles, essentially stable. Vascular: Arterial findings below. Normal dural venous sinus flow voids. Skull and upper cervical spine: No evidence of marrow lesion. Sinuses/Orbits: Left cataract resection. Pansinusitis with mucosal thickening, secretions, and fluid levels. Other: Motion degraded. MRA HEAD FINDINGS Accounting for areas of artifactual signal drop there is good patency of carotid vertebral and basilar arteries and the medium size branches. No evidence of vascular lesion. IMPRESSION: 1. No acute intracranial finding. 2. Atrophy and remote posterior fossa infarcts that are stable since 2016. 3. Pansinusitis. 4. Negative intracranial MRA. Electronically Signed   By: Monte Fantasia M.D.   On: 03/05/2017 09:14   Mr Brain Wo Contrast  Result Date: 03/05/2017 CLINICAL DATA:  Seizure, new nontraumatic. Found unresponsive 2 days ago. Weakness and confusion. EXAM: MRI HEAD WITHOUT CONTRAST MRA HEAD WITHOUT CONTRAST TECHNIQUE: Multiplanar, multiecho pulse sequences of the brain and surrounding structures were obtained without intravenous contrast. Angiographic images of the head were obtained using  MRA technique without contrast. COMPARISON:  Head CT and CTA from yesterday.  Brain MRI 08/20/2014 FINDINGS: MRI HEAD FINDINGS Brain: No acute infarction, hemorrhage, hydrocephalus, extra-axial collection or mass lesion. Moderate right more than left remote cerebellar infarct. Small remote lateral right medullary infarct. Generalized atrophy without progression since 2016. Chronic small vessel ischemia in the cerebral white matter that is confluent around the lateral ventricles, essentially stable. Vascular: Arterial findings below. Normal dural venous sinus flow voids. Skull and upper cervical spine: No evidence of marrow lesion. Sinuses/Orbits: Left cataract resection. Pansinusitis with mucosal thickening, secretions, and fluid levels. Other: Motion degraded. MRA HEAD FINDINGS Accounting for areas of artifactual signal drop there is good patency of carotid vertebral and basilar arteries and the medium size branches. No evidence of vascular lesion. IMPRESSION: 1. No acute intracranial finding. 2. Atrophy and remote posterior fossa infarcts that are stable since 2016. 3. Pansinusitis. 4. Negative intracranial MRA. Electronically Signed   By: Monte Fantasia M.D.   On: 03/05/2017 09:14   US Carotid Bilateral  Result Date: 03/04/2017 CLINICAL DATA:  Recent stroke. History of hypertension, hyperlipidemia and smoking. EXAM: BILATERAL CAROTID DUPLEX ULTRASOUND TECHNIQUE: Pearline Cables scale imaging, color Doppler and duplex ultrasound were performed of bilateral carotid and vertebral arteries in the neck. COMPARISON:  Adductor ultrasound - 10/13/2012 FINDINGS: Criteria: Quantification of carotid stenosis is based on velocity parameters that correlate the residual internal carotid diameter with NASCET-based stenosis levels, using the diameter of the distal internal carotid lumen as the denominator for stenosis measurement. The following velocity measurements were obtained: RIGHT ICA:  104/18 cm/sec CCA:  841/66 cm/sec SYSTOLIC  ICA/CCA RATIO:  0.6 DIASTOLIC ICA/CCA RATIO:  1.3 ECA:  146 cm/sec LEFT ICA:  158/28 cm/sec CCA:  063/01 cm/sec SYSTOLIC ICA/CCA RATIO:  1.2 DIASTOLIC ICA/CCA RATIO:  2.1 ECA:  134 cm/sec RIGHT CAROTID ARTERY: There is a moderate amount of eccentric mixed echogenic plaque within the right carotid bulb (images 16 and 18), extending to involve the origin and  proximal aspects of the right internal carotid artery (image 26), progressed compared to the 10/2012 examination, though again not resulting in elevated peak systolic velocities within the interrogated course the right internal carotid artery to suggest a hemodynamically significant stenosis. RIGHT VERTEBRAL ARTERY:  Antegrade Flow LEFT CAROTID ARTERY: There is a minimal to moderate amount of atherosclerotic plaque scattered throughout the left common carotid artery, morphologically similar to the 10/2012 examination. There is a moderate to large amount of eccentric mixed echogenic partially shadowing plaque within the left carotid bulb (image 54 and 57), extending to involve the origin and proximal aspect the left internal carotid artery (image 64), likely progressed compared to the 10/2012 examination and again resulting in elevated peak systolic velocities within the proximal aspect the left internal carotid artery. Greatest acquired peak systolic velocity with the proximal left ICA measures 158 cm/sec, previously 138 cm/sec. LEFT VERTEBRAL ARTERY:  Antegrade Flow IMPRESSION: 1. Interval progression of now moderate to large amount of left-sided atherosclerotic plaque results in elevated peak systolic velocities within the left internal carotid artery compatible with the 50 to 69% luminal narrowing range. Further evaluation with CTA could performed as clinically indicated. 2. Slight progression of now moderate amount of right-sided atherosclerotic plaque though again not definitely resulting in a hemodynamically significant stenosis. Electronically Signed   By:  Sandi Mariscal M.D.   On: 03/04/2017 13:13   Dg Swallowing Func-speech Pathology  Result Date: 03/05/2017 Objective Swallowing Evaluation: Type of Study: MBS-Modified Barium Swallow Study  Patient Details Name: [name] MRN: [id number] Date of Birth: 08-25-1934 Today's Date: 03/05/2017 Time: SLP Start Time (ACUTE ONLY): 1540 -SLP Stop Time (ACUTE ONLY): 1625 SLP Time Calculation (min) (ACUTE ONLY): 45 min Past Medical History: Past Medical History: Diagnosis Date . Abnormality of gait 08/01/2013 . Arthritis  . Blindness of left eye   Posttraumatic . Carotid artery disease ([place])  . Colon polyp  . Constipation  . Diaphragmatic hernia  . Disc disease, degenerative, cervical  . Diverticulitis  . ED (erectile dysfunction)  . Gastroparesis  . GERD (gastroesophageal reflux disease)  . Glaucoma  . Heart disease  . Hyperlipidemia  . Hypertension  . Insomnia  . Memory difficulty 08/04/2014 . Nicotine dependence  . Numbness and tingling of right arm 08/04/2014 . Osteoarthritis  . Overactive bladder  . Radiculopathy  . Shortness of breath   with activity . Sleep apnea   does not use CPAP.  Tested maybe 5- 10 years ago . Stroke [facility])   Left side weakness. . Unilateral inguinal hernia  Past Surgical History: Past Surgical History: Procedure Laterality Date . ANTERIOR CERVICAL DECOMP/DISCECTOMY FUSION N/A 07/01/2012  Procedure: ANTERIOR CERVICAL DECOMPRESSION/DISCECTOMY FUSION CERVICAL FIVE-SIX [name] REMOVAL CERVICAL SIX-SEVEN;  Surgeon: [name], MD;  Location: [facility];  Service: Neurosurgery;  Laterality: N/A; . CERVICAL FUSION   . COLONOSCOPY   08/04/2002    RMR: Normal rectum/Pancolonic diverticula/Colonic polyps as described above, biopsied and/or snared . COLONOSCOPY  60/08/2009  RMR: normal rectum/left and right sided diverticula/multiple colonic polyps. tubular adenomas. surveillance due 2014 . COLONOSCOPY WITH ESOPHAGOGASTRODUODENOSCOPY (EGD) N/A 12/18/2012  Dr. [name]:  Colonic diverticulosis. Multiple colonic  polyps-hyperplastic polyps and tubular adenoma. Friable anal canal hemorrhoids. EGD with chronic atrophic gastritis . ESOPHAGOGASTRODUODENOSCOPY  04/14/2002  XNA:[id number]' ring, status post dilation as described above/Hiatal hernia, focal antral erosions of uncertain clinical significance . ESOPHAGOGASTRODUODENOSCOPY  06/09/2009  RMR: normal esophagus s/p dilator/small hiatal hernia otherwise normal . HERNIA REPAIR Right   inguinal . INGUINAL HERNIA REPAIR Left  04/15/2014  Procedure: LEFT INGUINAL HERNIORRHAPHY WITH MESH;  Surgeon: [name], MD;  Location: [facility];  Service: General;  Laterality: Left; . INSERTION OF MESH Left 04/15/2014  Procedure: INSERTION OF MESH;  Surgeon: [name], MD;  Location: [facility];  Service: General;  Laterality: Left; HPI: Freman Lapage is a 83 y.o. male with medical history significant for hypertension, obstructive sleep apnea, GERD, dyslipidemia, hypertension, prior CVA, and dementia who was brought to the emergency department after he was noted to have what appeared to be seizure-like activity at approximately 1730 today.  His son-in-law witnessed the event and the patient was noted to be shaking in his chair and his eyes rolled back and he became rigid and went into tonic-clonic seizures.  The episode lasted approximately 10-15 minutes and the patient urinated over himself, but did not have any biting of his tongue.  He was difficult to arouse for several minutes after the episode until EMS arrived at which point, the patient became more alert.  The patient was being treated for recent pneumonia with Augmentin and began the course approximately 1 week ago.  He was also on a prednisone taper which he states is almost finished. BSE requested.  Subjective: "I get choked on nuts." Assessment / Plan / Recommendation CHL IP CLINICAL IMPRESSIONS 03/05/2017 Clinical Impression Pt presents with mild oropharyngeal phase dysphagia characterized by decreased oral bolus cohesiveness with  piecemeal deglutition, min premature spillage and delay in swallow initiation with swallow trigger after filling valleculae and spilling to pyriforms with straw sips of thin. The above results in min lingual residue which trickles to valleculae post swallow and is generally cleared with a spontaneous repeat/dry swallow. 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 Pt with min pyriform residue post swallow with puree and solids near the entrance to UES and also at the level of previous ACDF (~C5/6). Pt with trace, flash penetration of thins when taking sequential straw sips, other wise no penetration or aspiration observed during the study. Pt with a subjective c/o coughing when eating nuts and chips at home. When Pt was given the barium tablet, he swallowed it without liquid and it became delayed in the pyriforms and did not pass through UES until he followed with liquid wash. Suspect that this may also occur with nut pieces. Esophageal sweep was unremarkable, pill traversed to stomach after liquid wash. Pt was assessed with nectar thick liquids merely to ascertain which side of pharynx showed greater retention of bolus and it was shown to be the right side. After reviewing the study with Pt and his wife, the patient shared that he frequently eats peanuts throughout the day. Given the retention of pill in pyriforms during this study and Pt's subjective report of "getting choked" on nuts and chips in setting of PNA, recommend D3/mech soft with thin liquids with use of aspiration precautions reviewed with Pt/family. Pt can resume a self regulated "regular texture" diet once at home with avoidance of nuts, cornbread, chips, and seeds. It was suggested that he try eating creamy peanut butter to replace the peanuts. Pt and wife in agreement. Pt to sit fully upright for all eating/drinking and remain  upright for at least 30 minutes following, take small bites/sips, and swallow 2-3 times for each bite/sip. No further SLP services indicated at this time.  SLP Visit Diagnosis Dysphagia, oropharyngeal  phase (R13.12) Attention and concentration deficit following -- Frontal lobe and executive function deficit following -- Impact on safety and function Mild aspiration risk   CHL IP TREATMENT RECOMMENDATION 03/05/2017 Treatment Recommendations No treatment recommended at this time   Prognosis 03/05/2017 Prognosis for Safe Diet Advancement Good Barriers to Reach Goals -- Barriers/Prognosis Comment -- [facility]L IP DIET RECOMMENDATION 03/05/2017 SLP Diet Recommendations Dysphagia 3 (Mech soft) solids;Thin liquid;Regular solids Liquid Administration via Cup;Straw Medication Administration Whole meds with liquid Compensations Small sips/bites;Multiple dry swallows after each bite/sip Postural Changes Remain semi-upright after after feeds/meals (Comment);Seated upright at 90 degrees   CHL IP OTHER RECOMMENDATIONS 03/05/2017 Recommended Consults -- Oral Care Recommendations Oral care BID;Staff/trained caregiver to provide oral care Other Recommendations Clarify dietary restrictions   CHL IP FOLLOW UP RECOMMENDATIONS 03/05/2017 Follow up Recommendations None   CHL IP FREQUENCY AND DURATION 03/04/2017 Speech Therapy Frequency (ACUTE ONLY) min 2x/week Treatment Duration 1 week      CHL IP ORAL PHASE 03/05/2017 Oral Phase Impaired Oral - Pudding Teaspoon -- Oral - Pudding Cup -- Oral - Honey Teaspoon -- Oral - Honey Cup -- Oral - Nectar Teaspoon -- Oral - Nectar Cup -- Oral - Nectar Straw -- Oral - Thin Teaspoon -- Oral - Thin Cup Lingual/palatal residue;Decreased bolus cohesion Oral - Thin Straw -- Oral - Puree Piecemeal swallowing;Decreased bolus cohesion Oral - Mech Soft -- Oral - Regular -- Oral - Multi-Consistency -- Oral - Pill -- Oral Phase - Comment --  CHL IP PHARYNGEAL PHASE 03/05/2017 Pharyngeal Phase Impaired Pharyngeal- Pudding Teaspoon --  Pharyngeal -- Pharyngeal- Pudding Cup -- Pharyngeal -- Pharyngeal- Honey Teaspoon -- Pharyngeal -- Pharyngeal- Honey Cup -- Pharyngeal -- Pharyngeal- Nectar Teaspoon -- Pharyngeal -- Pharyngeal- Nectar Cup -- Pharyngeal -- Pharyngeal- Nectar Straw Delayed swallow initiation-vallecula;Pharyngeal residue - valleculae;Pharyngeal residue - pyriform Pharyngeal -- Pharyngeal- Thin Teaspoon WFL Pharyngeal -- Pharyngeal- Thin Cup Delayed swallow initiation-vallecula;Pharyngeal residue - pyriform Pharyngeal -- Pharyngeal- Thin Straw Delayed swallow initiation-vallecula;Delayed swallow initiation-pyriform sinuses;Reduced epiglottic inversion;Penetration/Aspiration during swallow;Pharyngeal residue - pyriform Pharyngeal Material does not enter airway;Material enters airway, remains ABOVE vocal cords then ejected out Pharyngeal- Puree Delayed swallow initiation-vallecula;Pharyngeal residue - pyriform Pharyngeal -- Pharyngeal- Mechanical Soft -- Pharyngeal -- Pharyngeal- Regular Delayed swallow initiation-vallecula;Pharyngeal residue - pyriform Pharyngeal -- Pharyngeal- Multi-consistency -- Pharyngeal -- Pharyngeal- Pill Pharyngeal residue - pyriform Pharyngeal -- Pharyngeal Comment slight retention of solids near UES at level of ACDF repair                                                        Thank you, Genene Churn, [place] 03/05/2017, 11:09 PM              DGL:OVFI recording awake and drowsy states shows mild global slowing indicating a mild global encephalopathy. However, no epileptiform activities are observed.  Echo:- Normal LV systolic function; mild proximal septal thickening;   mild diastolic dysfunction; mild MR and TR.  Subjective: No chest pain or shortness of breath.  No nausea or vomiting.  Discharge Exam: Vitals:   03/05/17 2109 03/06/17 0505  BP: 125/61 (!) 130/57  Pulse: 72 79  Resp: 17 17  Temp: 97.9 F (36.6 C) 98.4 F (36.9 C)  SpO2: 97% 97%   Vitals:    03/05/17 1400 03/05/17 2109 03/05/17 2244 03/06/17 0505  BP: [name])  121/57 125/61  (!) 130/57  Pulse: 61 72  79  Resp: 17 17  17   Temp: 97.7 F (36.5 C) 97.9 F (36.6 C)  98.4 F (36.9 C)  TempSrc: Oral Oral  Oral  SpO2: 95% 97%  97%  Weight:   72.3 kg (159 lb 6.3 oz)   Height:   5\' 9"  (1.753 m)     General: Pt is alert, awake, not in acute distress Cardiovascular: RRR, S1/S2 +, no rubs, no gallops Respiratory: CTA bilaterally, no wheezing, no rhonchi Abdominal: Soft, NT, ND, bowel sounds + Extremities: no edema, no cyanosis    The results of significant diagnostics from this hospitalization (including imaging, microbiology, ancillary and laboratory) are listed below for reference.     Microbiology: No results found for this or any previous visit (from the past 240 hour(s)).   Labs: BNP (last 3 results) No results for input(s): BNP in the last 8760 hours. Basic Metabolic Panel: Recent Labs  Lab 03/03/17 2024 03/04/17 0613  NA 139 138  K 4.7 3.9  CL 104 104  CO2 26 26  GLUCOSE 132* 90  BUN 18 18  CREATININE 0.85 0.89  CALCIUM 9.1 8.7*  MG 2.1  --    Liver Function Tests: No results for input(s): AST, ALT, ALKPHOS, BILITOT, PROT, ALBUMIN in the last 168 hours. No results for input(s): LIPASE, AMYLASE in the last 168 hours. No results for input(s): AMMONIA in the last 168 hours. CBC: Recent Labs  Lab 03/03/17 2024 03/04/17 0613  WBC 5.3 8.3  NEUTROABS 3.7  --   HGB 13.0 11.7*  HCT 40.0 36.6*  MCV 94.1 93.8  PLT 226 220   Cardiac Enzymes: Recent Labs  Lab 03/03/17 2024 03/03/17 2349 03/04/17 0613 03/04/17 1219  TROPONINI 0.10* 0.08* 0.09* 0.10*   BNP: Invalid input(s): POCBNP CBG: No results for input(s): GLUCAP in the last 168 hours. D-Dimer No results for input(s): DDIMER in the last 72 hours. Hgb A1c No results for input(s): HGBA1C in the last 72 hours. Lipid Profile Recent Labs    03/04/17 0613  CHOL 114  HDL 43  LDLCALC 62  TRIG 45   CHOLHDL 2.7   Thyroid function studies Recent Labs    03/03/17 2349  TSH 0.653   Anemia work up No results for input(s): VITAMINB12, FOLATE, FERRITIN, TIBC, IRON, RETICCTPCT in the last 72 hours. Urinalysis    Component Value Date/Time   COLORURINE YELLOW 03/03/2017 1827   APPEARANCEUR CLEAR 03/03/2017 1827   LABSPEC 1.012 03/03/2017 1827   PHURINE 7.0 03/03/2017 1827   GLUCOSEU NEGATIVE 03/03/2017 1827   HGBUR NEGATIVE 03/03/2017 1827   BILIRUBINUR NEGATIVE 03/03/2017 1827   KETONESUR NEGATIVE 03/03/2017 1827   PROTEINUR NEGATIVE 03/03/2017 1827   UROBILINOGEN 0.2 04/26/2014 0930   NITRITE NEGATIVE 03/03/2017 1827   LEUKOCYTESUR NEGATIVE 03/03/2017 1827   Sepsis Labs Invalid input(s): PROCALCITONIN,  WBC,  LACTICIDVEN Microbiology No results found for this or any previous visit (from the past 240 hour(s)).   Time coordinating discharge: Over 30 minutes  SIGNED:   Kathie Dike, MD  [facility] 03/06/2017, 11:55 AM Pager   If 7PM-7AM, please contact night-coverage [email or web address] Password [email or web address]

## 2017-03-06 NOTE — Progress Notes (Signed)
Patient's IVs removed, 2x2 gauze and paper tape applied to sites, reviewed AVS with patient's wife, who verbalized understanding.  Patient transported home via daughter.

## 2017-03-06 NOTE — Discharge Planning (Deleted)
Physician Discharge Summary  [name] NAT:[id number] DOB: September 10, 1934 DOA: 03/03/2017  PCP: Alycia Rossetti, MD  Admit date: 03/03/2017 Discharge date: 03/06/2017  Admitted From: home Disposition:  home  Recommendations for Outpatient Follow-up:  1. Follow up with PCP in 1-2 weeks 2. Please obtain BMP/CBC in one week 3. Patient will need repeat imaging of his chest in 4-6 weeks to ensure resolution of bilateral pulmonary nodules 4. Follow-up with neurology in 2 months 5. Patient will be referred to vascular surgery for left ICA stenosis  Home Health: Equipment/Devices:  Discharge Condition: stable CODE STATUS: DNR Diet recommendation: dysphagia 3 with thin liquids  Brief/Interim Summary: 83 year old male with a history of hypertension, prior CVA, dementia, presents to the hospital after having a seizure.  This was witnessed by family members.  He had a tonic-clonic seizure with urinary incontinence.  He was postictal after episode.  He was admitted to the hospital for further evaluation.  Chest x-ray indicated persistent pneumonia.   Discharge Diagnoses:  Principal Problem:   Seizure [facility]) Active Problems:   Essential hypertension   History of stroke   Dementia   Pneumonia   Internal carotid artery stenosis, left   Dysphagia   Pulmonary nodules  1. New-onset seizure.  Patient has a prior history of CVA.  He was loaded with Keppra in the emergency room and started on twice daily Keppra.  EEG did not show any epileptiform discharges.  MRI/MRA did not show any evidence of infarct.  2D echocardiogram was unremarkable.  Carotid Dopplers with results as below.  Neurology evaluated the patient and agreed with continuing Upton for now.  He will be followed up in the next 2 months. 2. Left internal carotid artery stenosis.  Noted on carotid Dopplers to have 50-69% narrowing of left internal carotid artery.  CT Angio of the neck demonstrated stenosis of up to 60%.    Referral has been made  for outpatient vascular surgery follow-up.  Continue on aspirin.  LDL acceptable range 3. Recurrent pneumonia.  Started on Zosyn.  Similar area of pneumonia noted on chest x-ray from 01/2016.  He completed a course of antibiotics at that time.    CT chest did not indicate any obvious malignancy, but did make note of bilateral pulmonary nodules measuring up to 8 mm.  These were felt to be indeterminate.  He will need repeat imaging of his chest in the next 4-6 weeks.  Will continue on a course of antibiotics.   4. Dysphagia.  Due to recurrent pneumonia, he was evaluated by speech therapy.  It was felt that given the retention of barium pill in pyriforms during the study, and patient subjective report of "getting choked" on nuts and chips, it was recommended that patient continue on dysphagia 3/mechanical soft diet with thin liquids with use of aspiration precautions. 5. Hypertension.    Stable on lisinopril 6. Dementia.  Continue Aricept 7. Elevated troponin.  Minimal elevation, likely related to demand ischemia.  Echocardiogram is unremarkable. 8. Dilated ascending aorta.  Incidental finding on CT chest, up to 4 cm.  Follow-up with vascular surgery.     Discharge Instructions  Discharge Instructions    Ambulatory referral to Vascular Surgery   Complete by:  As directed    Patient with left ICA stenosis. He does not have a preference for provider. Please set up with next available appointment   Diet - low sodium heart healthy   Complete by:  As directed    Increase activity slowly  Complete by:  As directed      Allergies as of 03/06/2017      Reactions   Aspirin Nausea And Vomiting   Tolerates 81mg  daily      Medication List    STOP taking these medications   predniSONE 10 MG tablet Commonly known as:  DELTASONE     TAKE these medications   albuterol 108 (90 Base) MCG/ACT inhaler Commonly known as:  PROVENTIL HFA;VENTOLIN HFA Inhale 2 puffs into the lungs every 4 (four) hours as  needed for wheezing or shortness of breath.   amoxicillin-clavulanate 875-125 MG tablet Commonly known as:  AUGMENTIN Take 1 tablet by mouth 2 (two) times daily.   aspirin EC 81 MG tablet Take 162 mg by mouth at bedtime.   cholecalciferol 1000 units tablet Commonly known as:  VITAMIN D TAKE 1 CAPSULE BY MOUTH ONCE DAILY.   citalopram 20 MG tablet Commonly known as:  CELEXA TAKE 1 TABLET BY MOUTH ONCE A DAY FOR DEPRESSION.   donepezil 10 MG tablet Commonly known as:  ARICEPT TAKE (1) TABLET BY MOUTH AT BEDTIME.   fluticasone 50 MCG/ACT nasal spray Commonly known as:  FLONASE USE 1 OR 2 SPRAYS IN EACH NOSTRIL EVERY DAY FOR NASAL ALLERGIES.   HYDROcodone-acetaminophen 5-325 MG tablet Commonly known as:  NORCO/VICODIN TAKE 1 TABLET BY MOUTH THREE TIMES DAILY AS NEEDED FOR MODERATE PAIN   levETIRAcetam 250 MG tablet Commonly known as:  KEPPRA Take 1 tablet (250 mg total) by mouth every morning.   lisinopril 10 MG tablet Commonly known as:  PRINIVIL,ZESTRIL Take 10 mg by mouth daily.   LORazepam 1 MG tablet Commonly known as:  ATIVAN TAKE (1) TABLET BY MOUTH AT BEDTIME.   lubiprostone 24 MCG capsule Commonly known as:  AMITIZA Take 1 capsule (24 mcg total) by mouth daily with breakfast. MAY TAKE ADDITIONAL DOSE QD PRN.   mirtazapine 15 MG tablet Commonly known as:  REMERON TAKE 1/2 TABLET(7.5MG ) BY MOUTH AT BEDTIME FOR APPETITE.   multivitamin with minerals Tabs tablet Take 1 tablet by mouth daily.   naproxen sodium 220 MG tablet Commonly known as:  ALEVE Take 440 mg by mouth daily as needed.   nitroGLYCERIN 0.4 MG SL tablet Commonly known as:  NITROSTAT Place 0.4 mg under the tongue every 5 (five) minutes as needed for chest pain.   omeprazole 20 MG capsule Commonly known as:  PRILOSEC TAKE 1 CAPSULE BY MOUTH ONCE DAILY FOR RELFUX ESOPHAGITIS OR STOMACH ULCERS.   traZODone 100 MG tablet Commonly known as:  DESYREL TAKE (1) TABLET BY MOUTH AT BEDTIME.    VESICARE 5 MG tablet Generic drug:  solifenacin TAKE ONE TABLET ONCE DAILY FOR BLADDER.       Allergies  Allergen Reactions  . Aspirin Nausea And Vomiting    Tolerates 81mg  daily    Consultations:  Neurology  Speech therapy   Procedures/Studies: Dg Chest 2 View  Result Date: 03/03/2017 CLINICAL DATA:  Patient with recent flu and pneumonia. EXAM: CHEST  2 VIEW COMPARISON:  Chest radiograph 01/06/2017; 06/16/2015. FINDINGS: Monitoring leads overlie the patient. Stable cardiomegaly. Aortic atherosclerosis. Interval development of peripheral right mid lung and left lower lung patchy consolidative opacities. Probable small bilateral pleural effusions. Chronic changes within the left lower lung. Thoracic spine degenerative changes. IMPRESSION: Findings suggestive of possible right mid lung and left lower lung consolidation concerning for pneumonia in the appropriate clinical setting. Followup PA and lateral chest X-ray is recommended in 3-4 weeks following trial of antibiotic  therapy to ensure resolution and exclude underlying malignancy. Electronically Signed   By: Lovey Newcomer M.D.   On: 03/03/2017 19:54   Ct Head Wo Contrast  Result Date: 03/03/2017 CLINICAL DATA:  Patient found unresponsive at 1740 hours today. EXAM: CT HEAD WITHOUT CONTRAST TECHNIQUE: Contiguous axial images were obtained from the base of the skull through the vertex without intravenous contrast. COMPARISON:  Head CT scan 10/12/2012.  Brain MRI 08/13/2014. FINDINGS: Brain: No evidence of acute infarction, hemorrhage, hydrocephalus, extra-axial collection or mass lesion/mass effect. Cortical atrophy and chronic microvascular ischemic change are seen. Vascular: No hyperdense vessel or unexpected calcification. Skull: Intact. Sinuses/Orbits: Mucosal thickening is seen in all the imaged paranasal sinuses. Other: None. IMPRESSION: No acute abnormality. Atrophy and chronic microvascular ischemic disease. Sinus disease.  Electronically Signed   By: Inge Rise M.D.   On: 03/03/2017 19:53   Ct Angio Neck W Or Wo Contrast  Result Date: 03/04/2017 CLINICAL DATA:  Initial evaluation for carotid stenosis, pneumonia. EXAM: CT ANGIOGRAPHY NECK CT CHEST WITH CONTRAST TECHNIQUE: Multidetector CT imaging of the neck was performed using the standard protocol during bolus administration of intravenous contrast. Multiplanar CT image reconstructions and MIPs were obtained to evaluate the vascular anatomy. Carotid stenosis measurements (when applicable) are obtained utilizing NASCET criteria, using the distal internal carotid diameter as the denominator. Multi detector CT imaging of the chest was performed using the standard protocol following the administration of IV contrast. Coronal sagittal reformatted images provided. CONTRAST:  157mL ISOVUE-370 IOPAMIDOL (ISOVUE-370) INJECTION 76% COMPARISON:  Prior chest radiograph from 03/03/2017. FINDINGS: CTA NECK FINDINGS: Aortic arch: Visualized ascending aorta up ectatic, better evaluated on concomitant chest CT. Visualized aorta otherwise within normal limits with normal branch pattern. Scattered atheromatous plaque about the arch itself. No flow-limiting stenosis about the origin of the great vessels. Left subclavian artery widely patent. Multifocal atheromatous irregularity with stenoses measuring up to approximately 50% present within the mid-distal right subclavian artery. Right carotid system: Right common carotid artery patent from its origin to the bifurcation without significant stenosis. Mild a centric mixed plaque about the right bifurcation/proximal right ICA with relatively mild narrowing of no more than 25% by NASCET criteria. Right ICA patent distally to the skull base without stenosis, dissection, or occlusion. Left carotid system: Left common carotid artery patent from its origin to the bifurcation without flow-limiting stenosis. Moderate predominantly calcified plaque about  the left bifurcation/proximal left ICA. Resultant moderate stenosis of approximately 60% by NASCET criteria (series 7, image 122). Stenosis begins at the bifurcation and measures approximately 12 mm in length. Left ICA widely patent distally to the skull base without additional stenosis, dissection, or occlusion. Vertebral arteries: Both of the vertebral arteries arise from the subclavian arteries. Mild atheromatous narrowing at the origin of the vertebral arteries bilaterally. Skeleton: No acute osseous abnormality. No worrisome lytic or blastic osseous lesions. Patient is status post ACDF at C5 through C7. Multilevel facet arthrosis, worse on the left. Other neck: No acute soft tissue abnormality within the neck. Salivary glands within normal limits. Thyroid normal. No adenopathy. Diffuse mucosal thickening with air-fluid levels noted within the paranasal sinuses, suggesting acute pan sinusitis. CT CHEST FINDINGS: Ascending aorta ectatic measuring up to 4 cm in diameter. Mild to moderate aortic atherosclerosis. Visualized aorta otherwise unremarkable. Mild cardiomegaly. No pericardial effusion. Three vessel coronary artery calcifications. Limited evaluation of the pulmonary arterial tree grossly unremarkable. Thyroid within normal limits. Enlarged 11 mm pretracheal node noted (series 13, image 52). Enlarged subcarinal node measures 16  mm in short axis (series 13, image 37). Findings are indeterminate, but may be reactive. Large 2 cm calcified prevascular nodes suggest prior granulomatous infection. No enlarged hilar or axillary adenopathy. Esophagus within normal limits. Tracheobronchial tree is patent. Lungs well inflated bilaterally. Small layering right pleural effusion. Mild bibasilar atelectatic changes. There are multifocal ground-glass opacities involving the bilateral upper lobes, right middle lobe, with more mild involvement within the superior aspects of the lower lobes bilaterally. Findings suspicious  for multifocal infection/pneumonia. Component of underlying fibrotic lung changes with bronchiectasis noted. No significant pulmonary edema. No pneumothorax. Pleural base 8 mm left lower lobe nodule noted within the superior segment of left lower lobe (series 15, image 69). Additional 7 mm nodular density present within the peripheral right lower lobe (series 15, image 92). Findings are indeterminate. Visualized upper abdomen demonstrates no acute abnormality. Few scattered shotty subcentimeter gastrohepatic lymph nodes noted. No acute osseus abnormality. No worrisome lytic or blastic osseous lesions. IMPRESSION: 1. Moderate short-segment atheromatous stenosis measuring up to 60% by NASCET criteria at the proximal left ICA. 2. Mild atheromatous stenosis of no more than 25% involving the proximal right ICA. 3. Widely patent vertebral arteries within the neck. 4. Acute pan sinusitis. CT CHEST IMPRESSION: 1. Patchy multifocal upper lobe predominant ground-glass opacities involving the bilateral lungs as above, suspicious for multifocal pneumonia. Radiographic follow-up to resolution recommended. 2. Small layering right pleural effusion with associated atelectasis. 3. Bilateral pulmonary nodules measuring up to 8 mm as above, indeterminate, but may be infectious in nature. Attention at follow-up recommended. 4. Aortic atherosclerosis with diffuse 3 vessel coronary artery calcifications. 5. **An incidental finding of potential clinical significance has been found. Dilatation of the ascending aorta up to 4 cm in diameter. Recommend annual imaging followup by CTA or MRA. This recommendation follows 2010 ACCF/AHA/AATS/ACR/ASA/SCA/SCAI/SIR/STS/SVM Guidelines for the Diagnosis and Management of Patients with Thoracic Aortic Disease. Circulation. 2010; 121: Z610-R604** Electronically Signed   By: Jeannine Boga M.D.   On: 03/04/2017 18:54   Ct Chest W Contrast  Result Date: 03/04/2017 CLINICAL DATA:  Initial  evaluation for carotid stenosis, pneumonia. EXAM: CT ANGIOGRAPHY NECK CT CHEST WITH CONTRAST TECHNIQUE: Multidetector CT imaging of the neck was performed using the standard protocol during bolus administration of intravenous contrast. Multiplanar CT image reconstructions and MIPs were obtained to evaluate the vascular anatomy. Carotid stenosis measurements (when applicable) are obtained utilizing NASCET criteria, using the distal internal carotid diameter as the denominator. Multi detector CT imaging of the chest was performed using the standard protocol following the administration of IV contrast. Coronal sagittal reformatted images provided. CONTRAST:  162mL ISOVUE-370 IOPAMIDOL (ISOVUE-370) INJECTION 76% COMPARISON:  Prior chest radiograph from 03/03/2017. FINDINGS: CTA NECK FINDINGS: Aortic arch: Visualized ascending aorta up ectatic, better evaluated on concomitant chest CT. Visualized aorta otherwise within normal limits with normal branch pattern. Scattered atheromatous plaque about the arch itself. No flow-limiting stenosis about the origin of the great vessels. Left subclavian artery widely patent. Multifocal atheromatous irregularity with stenoses measuring up to approximately 50% present within the mid-distal right subclavian artery. Right carotid system: Right common carotid artery patent from its origin to the bifurcation without significant stenosis. Mild a centric mixed plaque about the right bifurcation/proximal right ICA with relatively mild narrowing of no more than 25% by NASCET criteria. Right ICA patent distally to the skull base without stenosis, dissection, or occlusion. Left carotid system: Left common carotid artery patent from its origin to the bifurcation without flow-limiting stenosis. Moderate predominantly calcified plaque about  the left bifurcation/proximal left ICA. Resultant moderate stenosis of approximately 60% by NASCET criteria (series 7, image 122). Stenosis begins at the  bifurcation and measures approximately 12 mm in length. Left ICA widely patent distally to the skull base without additional stenosis, dissection, or occlusion. Vertebral arteries: Both of the vertebral arteries arise from the subclavian arteries. Mild atheromatous narrowing at the origin of the vertebral arteries bilaterally. Skeleton: No acute osseous abnormality. No worrisome lytic or blastic osseous lesions. Patient is status post ACDF at C5 through C7. Multilevel facet arthrosis, worse on the left. Other neck: No acute soft tissue abnormality within the neck. Salivary glands within normal limits. Thyroid normal. No adenopathy. Diffuse mucosal thickening with air-fluid levels noted within the paranasal sinuses, suggesting acute pan sinusitis. CT CHEST FINDINGS: Ascending aorta ectatic measuring up to 4 cm in diameter. Mild to moderate aortic atherosclerosis. Visualized aorta otherwise unremarkable. Mild cardiomegaly. No pericardial effusion. Three vessel coronary artery calcifications. Limited evaluation of the pulmonary arterial tree grossly unremarkable. Thyroid within normal limits. Enlarged 11 mm pretracheal node noted (series 13, image 52). Enlarged subcarinal node measures 16 mm in short axis (series 13, image 37). Findings are indeterminate, but may be reactive. Large 2 cm calcified prevascular nodes suggest prior granulomatous infection. No enlarged hilar or axillary adenopathy. Esophagus within normal limits. Tracheobronchial tree is patent. Lungs well inflated bilaterally. Small layering right pleural effusion. Mild bibasilar atelectatic changes. There are multifocal ground-glass opacities involving the bilateral upper lobes, right middle lobe, with more mild involvement within the superior aspects of the lower lobes bilaterally. Findings suspicious for multifocal infection/pneumonia. Component of underlying fibrotic lung changes with bronchiectasis noted. No significant pulmonary edema. No  pneumothorax. Pleural base 8 mm left lower lobe nodule noted within the superior segment of left lower lobe (series 15, image 69). Additional 7 mm nodular density present within the peripheral right lower lobe (series 15, image 92). Findings are indeterminate. Visualized upper abdomen demonstrates no acute abnormality. Few scattered shotty subcentimeter gastrohepatic lymph nodes noted. No acute osseus abnormality. No worrisome lytic or blastic osseous lesions. IMPRESSION: 1. Moderate short-segment atheromatous stenosis measuring up to 60% by NASCET criteria at the proximal left ICA. 2. Mild atheromatous stenosis of no more than 25% involving the proximal right ICA. 3. Widely patent vertebral arteries within the neck. 4. Acute pan sinusitis. CT CHEST IMPRESSION: 1. Patchy multifocal upper lobe predominant ground-glass opacities involving the bilateral lungs as above, suspicious for multifocal pneumonia. Radiographic follow-up to resolution recommended. 2. Small layering right pleural effusion with associated atelectasis. 3. Bilateral pulmonary nodules measuring up to 8 mm as above, indeterminate, but may be infectious in nature. Attention at follow-up recommended. 4. Aortic atherosclerosis with diffuse 3 vessel coronary artery calcifications. 5. **An incidental finding of potential clinical significance has been found. Dilatation of the ascending aorta up to 4 cm in diameter. Recommend annual imaging followup by CTA or MRA. This recommendation follows 2010 ACCF/AHA/AATS/ACR/ASA/SCA/SCAI/SIR/STS/SVM Guidelines for the Diagnosis and Management of Patients with Thoracic Aortic Disease. Circulation. 2010; 121: [id number]** Electronically Signed   By: Jeannine Boga M.D.   On: 03/04/2017 18:54   Mr Jodene Nam Head Wo Contrast  Result Date: 03/05/2017 CLINICAL DATA:  Seizure, new nontraumatic. Found unresponsive 2 days ago. Weakness and confusion. EXAM: MRI HEAD WITHOUT CONTRAST MRA HEAD WITHOUT CONTRAST TECHNIQUE:  Multiplanar, multiecho pulse sequences of the brain and surrounding structures were obtained without intravenous contrast. Angiographic images of the head were obtained using MRA technique without contrast. COMPARISON:  Head CT and  CTA from yesterday.  Brain MRI 08/20/2014 FINDINGS: MRI HEAD FINDINGS Brain: No acute infarction, hemorrhage, hydrocephalus, extra-axial collection or mass lesion. Moderate right more than left remote cerebellar infarct. Small remote lateral right medullary infarct. Generalized atrophy without progression since 2016. Chronic small vessel ischemia in the cerebral white matter that is confluent around the lateral ventricles, essentially stable. Vascular: Arterial findings below. Normal dural venous sinus flow voids. Skull and upper cervical spine: No evidence of marrow lesion. Sinuses/Orbits: Left cataract resection. Pansinusitis with mucosal thickening, secretions, and fluid levels. Other: Motion degraded. MRA HEAD FINDINGS Accounting for areas of artifactual signal drop there is good patency of carotid vertebral and basilar arteries and the medium size branches. No evidence of vascular lesion. IMPRESSION: 1. No acute intracranial finding. 2. Atrophy and remote posterior fossa infarcts that are stable since 2016. 3. Pansinusitis. 4. Negative intracranial MRA. Electronically Signed   By: Monte Fantasia M.D.   On: 03/05/2017 09:14   Mr Brain Wo Contrast  Result Date: 03/05/2017 CLINICAL DATA:  Seizure, new nontraumatic. Found unresponsive 2 days ago. Weakness and confusion. EXAM: MRI HEAD WITHOUT CONTRAST MRA HEAD WITHOUT CONTRAST TECHNIQUE: Multiplanar, multiecho pulse sequences of the brain and surrounding structures were obtained without intravenous contrast. Angiographic images of the head were obtained using MRA technique without contrast. COMPARISON:  Head CT and CTA from yesterday.  Brain MRI 08/20/2014 FINDINGS: MRI HEAD FINDINGS Brain: No acute infarction, hemorrhage,  hydrocephalus, extra-axial collection or mass lesion. Moderate right more than left remote cerebellar infarct. Small remote lateral right medullary infarct. Generalized atrophy without progression since 2016. Chronic small vessel ischemia in the cerebral white matter that is confluent around the lateral ventricles, essentially stable. Vascular: Arterial findings below. Normal dural venous sinus flow voids. Skull and upper cervical spine: No evidence of marrow lesion. Sinuses/Orbits: Left cataract resection. Pansinusitis with mucosal thickening, secretions, and fluid levels. Other: Motion degraded. MRA HEAD FINDINGS Accounting for areas of artifactual signal drop there is good patency of carotid vertebral and basilar arteries and the medium size branches. No evidence of vascular lesion. IMPRESSION: 1. No acute intracranial finding. 2. Atrophy and remote posterior fossa infarcts that are stable since 2016. 3. Pansinusitis. 4. Negative intracranial MRA. Electronically Signed   By: Monte Fantasia M.D.   On: 03/05/2017 09:14   US Carotid Bilateral  Result Date: 03/04/2017 CLINICAL DATA:  Recent stroke. History of hypertension, hyperlipidemia and smoking. EXAM: BILATERAL CAROTID DUPLEX ULTRASOUND TECHNIQUE: Pearline Cables scale imaging, color Doppler and duplex ultrasound were performed of bilateral carotid and vertebral arteries in the neck. COMPARISON:  Adductor ultrasound - 10/13/2012 FINDINGS: Criteria: Quantification of carotid stenosis is based on velocity parameters that correlate the residual internal carotid diameter with NASCET-based stenosis levels, using the diameter of the distal internal carotid lumen as the denominator for stenosis measurement. The following velocity measurements were obtained: RIGHT ICA:  104/18 cm/sec CCA:  176/16 cm/sec SYSTOLIC ICA/CCA RATIO:  0.6 DIASTOLIC ICA/CCA RATIO:  1.3 ECA:  146 cm/sec LEFT ICA:  158/28 cm/sec CCA:  073/71 cm/sec SYSTOLIC ICA/CCA RATIO:  1.2 DIASTOLIC ICA/CCA RATIO:   2.1 ECA:  134 cm/sec RIGHT CAROTID ARTERY: There is a moderate amount of eccentric mixed echogenic plaque within the right carotid bulb (images 16 and 18), extending to involve the origin and proximal aspects of the right internal carotid artery (image 26), progressed compared to the 10/2012 examination, though again not resulting in elevated peak systolic velocities within the interrogated course the right internal carotid artery to suggest a hemodynamically  significant stenosis. RIGHT VERTEBRAL ARTERY:  Antegrade Flow LEFT CAROTID ARTERY: There is a minimal to moderate amount of atherosclerotic plaque scattered throughout the left common carotid artery, morphologically similar to the 10/2012 examination. There is a moderate to large amount of eccentric mixed echogenic partially shadowing plaque within the left carotid bulb (image 54 and 57), extending to involve the origin and proximal aspect the left internal carotid artery (image 64), likely progressed compared to the 10/2012 examination and again resulting in elevated peak systolic velocities within the proximal aspect the left internal carotid artery. Greatest acquired peak systolic velocity with the proximal left ICA measures 158 cm/sec, previously 138 cm/sec. LEFT VERTEBRAL ARTERY:  Antegrade Flow IMPRESSION: 1. Interval progression of now moderate to large amount of left-sided atherosclerotic plaque results in elevated peak systolic velocities within the left internal carotid artery compatible with the 50 to 69% luminal narrowing range. Further evaluation with CTA could performed as clinically indicated. 2. Slight progression of now moderate amount of right-sided atherosclerotic plaque though again not definitely resulting in a hemodynamically significant stenosis. Electronically Signed   By: Sandi Mariscal M.D.   On: 03/04/2017 13:13   Dg Swallowing Func-speech Pathology  Result Date: 03/05/2017 Objective Swallowing Evaluation: Type of Study: MBS-Modified  Barium Swallow Study  Patient Details Name: [name] MRN: [id number] Date of Birth: 05/25/1934 Today's Date: 03/05/2017 Time: SLP Start Time (ACUTE ONLY): 1540 -SLP Stop Time (ACUTE ONLY): 1625 SLP Time Calculation (min) (ACUTE ONLY): 45 min Past Medical History: Past Medical History: Diagnosis Date . Abnormality of gait 08/01/2013 . Arthritis  . Blindness of left eye   Posttraumatic . Carotid artery disease ([place])  . Colon polyp  . Constipation  . Diaphragmatic hernia  . Disc disease, degenerative, cervical  . Diverticulitis  . ED (erectile dysfunction)  . Gastroparesis  . GERD (gastroesophageal reflux disease)  . Glaucoma  . Heart disease  . Hyperlipidemia  . Hypertension  . Insomnia  . Memory difficulty 08/04/2014 . Nicotine dependence  . Numbness and tingling of right arm 08/04/2014 . Osteoarthritis  . Overactive bladder  . Radiculopathy  . Shortness of breath   with activity . Sleep apnea   does not use CPAP.  Tested maybe 5- 10 years ago . Stroke [facility])   Left side weakness. . Unilateral inguinal hernia  Past Surgical History: Past Surgical History: Procedure Laterality Date . ANTERIOR CERVICAL DECOMP/DISCECTOMY FUSION N/A 07/01/2012  Procedure: ANTERIOR CERVICAL DECOMPRESSION/DISCECTOMY FUSION CERVICAL FIVE-SIX [name] REMOVAL CERVICAL SIX-SEVEN;  Surgeon: [name], MD;  Location: [place] [facility];  Service: Neurosurgery;  Laterality: N/A; . CERVICAL FUSION   . COLONOSCOPY   08/04/2002    RMR: Normal rectum/Pancolonic diverticula/Colonic polyps as described above, biopsied and/or snared . COLONOSCOPY  60/08/2009  RMR: normal rectum/left and right sided diverticula/multiple colonic polyps. tubular adenomas. surveillance due 2014 . COLONOSCOPY WITH ESOPHAGOGASTRODUODENOSCOPY (EGD) N/A 12/18/2012  Dr. [name]:  Colonic diverticulosis. Multiple colonic polyps-hyperplastic polyps and tubular adenoma. Friable anal canal hemorrhoids. EGD with chronic atrophic gastritis . ESOPHAGOGASTRODUODENOSCOPY  04/14/2002  EPP:[id number]'Y'  ring, status post dilation as described above/Hiatal hernia, focal antral erosions of uncertain clinical significance . ESOPHAGOGASTRODUODENOSCOPY  06/09/2009  RMR: normal esophagus s/p dilator/small hiatal hernia otherwise normal . HERNIA REPAIR Right   inguinal . INGUINAL HERNIA REPAIR Left 04/15/2014  Procedure: LEFT INGUINAL HERNIORRHAPHY WITH MESH;  Surgeon: [name], MD;  Location: [facility];  Service: General;  Laterality: Left; . INSERTION OF MESH Left 04/15/2014  Procedure: INSERTION OF MESH;  Surgeon: Elta Guadeloupe  Judithann Sauger, MD;  Location: [facility];  Service: General;  Laterality: Left; HPI: Ezio Wieck is a 83 y.o. male with medical history significant for hypertension, obstructive sleep apnea, GERD, dyslipidemia, hypertension, prior CVA, and dementia who was brought to the emergency department after he was noted to have what appeared to be seizure-like activity at approximately 1730 today.  His son-in-law witnessed the event and the patient was noted to be shaking in his chair and his eyes rolled back and he became rigid and went into tonic-clonic seizures.  The episode lasted approximately 10-15 minutes and the patient urinated over himself, but did not have any biting of his tongue.  He was difficult to arouse for several minutes after the episode until EMS arrived at which point, the patient became more alert.  The patient was being treated for recent pneumonia with Augmentin and began the course approximately 1 week ago.  He was also on a prednisone taper which he states is almost finished. BSE requested.  Subjective: "I get choked on nuts." Assessment / Plan / Recommendation CHL IP CLINICAL IMPRESSIONS 03/05/2017 Clinical Impression Pt presents with mild oropharyngeal phase dysphagia characterized by decreased oral bolus cohesiveness with piecemeal deglutition, min premature spillage and delay in swallow initiation with swallow trigger after filling valleculae and spilling to pyriforms with straw sips of  thin. The above results in min lingual residue which trickles to valleculae post swallow and is generally cleared with a spontaneous repeat/dry swallow. 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 Pt with min pyriform residue post swallow with puree and solids near the entrance to UES and also at the level of previous ACDF (~C5/6). Pt with trace, flash penetration of thins when taking sequential straw sips, other wise no penetration or aspiration observed during the study. Pt with a subjective c/o coughing when eating nuts and chips at home. When Pt was given the barium tablet, he swallowed it without liquid and it became delayed in the pyriforms and did not pass through UES until he followed with liquid wash. Suspect that this may also occur with nut pieces. Esophageal sweep was unremarkable, pill traversed to stomach after liquid wash. Pt was assessed with nectar thick liquids merely to ascertain which side of pharynx showed greater retention of bolus and it was shown to be the right side. After reviewing the study with Pt and his wife, the patient shared that he frequently eats peanuts throughout the day. Given the retention of pill in pyriforms during this study and Pt's subjective report of "getting choked" on nuts and chips in setting of PNA, recommend D3/mech soft with thin liquids with use of aspiration precautions reviewed with Pt/family. Pt can resume a self regulated "regular texture" diet once at home with avoidance of nuts, cornbread, chips, and seeds. It was suggested that he try eating creamy peanut butter to replace the peanuts. Pt and wife in agreement. Pt to sit fully upright for all eating/drinking and remain upright for at least 30 minutes following, take small bites/sips, and swallow 2-3 times for each bite/sip. No further SLP services indicated at this time.  SLP Visit  Diagnosis Dysphagia, oropharyngeal phase (R13.12) Attention and concentration deficit following -- Frontal lobe and executive function deficit following -- Impact on safety and function Mild aspiration risk   CHL IP TREATMENT RECOMMENDATION 03/05/2017 Treatment Recommendations No treatment recommended at this  time   Prognosis 03/05/2017 Prognosis for Safe Diet Advancement Good Barriers to Reach Goals -- Barriers/Prognosis Comment -- [facility]L IP DIET RECOMMENDATION 03/05/2017 SLP Diet Recommendations Dysphagia 3 (Mech soft) solids;Thin liquid;Regular solids Liquid Administration via Cup;Straw Medication Administration Whole meds with liquid Compensations Small sips/bites;Multiple dry swallows after each bite/sip Postural Changes Remain semi-upright after after feeds/meals (Comment);Seated upright at 90 degrees   CHL IP OTHER RECOMMENDATIONS 03/05/2017 Recommended Consults -- Oral Care Recommendations Oral care BID;Staff/trained caregiver to provide oral care Other Recommendations Clarify dietary restrictions   CHL IP FOLLOW UP RECOMMENDATIONS 03/05/2017 Follow up Recommendations None   CHL IP FREQUENCY AND DURATION 03/04/2017 Speech Therapy Frequency (ACUTE ONLY) min 2x/week Treatment Duration 1 week      CHL IP ORAL PHASE 03/05/2017 Oral Phase Impaired Oral - Pudding Teaspoon -- Oral - Pudding Cup -- Oral - Honey Teaspoon -- Oral - Honey Cup -- Oral - Nectar Teaspoon -- Oral - Nectar Cup -- Oral - Nectar Straw -- Oral - Thin Teaspoon -- Oral - Thin Cup Lingual/palatal residue;Decreased bolus cohesion Oral - Thin Straw -- Oral - Puree Piecemeal swallowing;Decreased bolus cohesion Oral - Mech Soft -- Oral - Regular -- Oral - Multi-Consistency -- Oral - Pill -- Oral Phase - Comment --  CHL IP PHARYNGEAL PHASE 03/05/2017 Pharyngeal Phase Impaired Pharyngeal- Pudding Teaspoon -- Pharyngeal -- Pharyngeal- Pudding Cup -- Pharyngeal -- Pharyngeal- Honey Teaspoon -- Pharyngeal -- Pharyngeal- Honey Cup -- Pharyngeal -- Pharyngeal- Nectar Teaspoon  -- Pharyngeal -- Pharyngeal- Nectar Cup -- Pharyngeal -- Pharyngeal- Nectar Straw Delayed swallow initiation-vallecula;Pharyngeal residue - valleculae;Pharyngeal residue - pyriform Pharyngeal -- Pharyngeal- Thin Teaspoon WFL Pharyngeal -- Pharyngeal- Thin Cup Delayed swallow initiation-vallecula;Pharyngeal residue - pyriform Pharyngeal -- Pharyngeal- Thin Straw Delayed swallow initiation-vallecula;Delayed swallow initiation-pyriform sinuses;Reduced epiglottic inversion;Penetration/Aspiration during swallow;Pharyngeal residue - pyriform Pharyngeal Material does not enter airway;Material enters airway, remains ABOVE vocal cords then ejected out Pharyngeal- Puree Delayed swallow initiation-vallecula;Pharyngeal residue - pyriform Pharyngeal -- Pharyngeal- Mechanical Soft -- Pharyngeal -- Pharyngeal- Regular Delayed swallow initiation-vallecula;Pharyngeal residue - pyriform Pharyngeal -- Pharyngeal- Multi-consistency -- Pharyngeal -- Pharyngeal- Pill Pharyngeal residue - pyriform Pharyngeal -- Pharyngeal Comment slight retention of solids near UES at level of ACDF repair                                                        Thank you, Genene Churn, [place] 03/05/2017, 11:09 PM               GMW:NUUV recording awake and drowsy states shows mild global slowing indicating a mild global encephalopathy. However, no epileptiform activities are observed.  Echo:- Normal LV systolic function; mild proximal septal thickening;   mild diastolic dysfunction; mild MR and TR.  Subjective: No chest pain or shortness of breath.  No nausea or vomiting.  Discharge Exam: Vitals:   03/05/17 2109 03/06/17 0505  BP: 125/61 (!) 130/57  Pulse: 72 79  Resp: 17 17  Temp: 97.9 F (36.6 C) 98.4 F (36.9 C)  SpO2: 97% 97%   Vitals:   03/05/17 1400 03/05/17 2109 03/05/17 2244 03/06/17 0505  BP: (!) 121/57 125/61  (!) 130/57  Pulse: 61 72  79  Resp: 17 17  17   Temp: 97.7 F (36.5 C) 97.9 F (36.6  C)  98.4 F (36.9 C)  TempSrc: Oral Oral  Oral  SpO2: 95% 97%  97%  Weight:   72.3 kg (159 lb 6.3 oz)   Height:   5\' 9"  (1.753 m)     General: Pt is alert, awake, not in acute distress Cardiovascular: RRR, S1/S2 +, no rubs, no gallops Respiratory: CTA bilaterally, no wheezing, no rhonchi Abdominal: Soft, NT, ND, bowel sounds + Extremities: no edema, no cyanosis    The results of significant diagnostics from this hospitalization (including imaging, microbiology, ancillary and laboratory) are listed below for reference.     Microbiology: No results found for this or any previous visit (from the past 240 hour(s)).   Labs: BNP (last 3 results) No results for input(s): BNP in the last 8760 hours. Basic Metabolic Panel: Recent Labs  Lab 03/03/17 2024 03/04/17 0613  NA 139 138  K 4.7 3.9  CL 104 104  CO2 26 26  GLUCOSE 132* 90  BUN 18 18  CREATININE 0.85 0.89  CALCIUM 9.1 8.7*  MG 2.1  --    Liver Function Tests: No results for input(s): AST, ALT, ALKPHOS, BILITOT, PROT, ALBUMIN in the last 168 hours. No results for input(s): LIPASE, AMYLASE in the last 168 hours. No results for input(s): AMMONIA in the last 168 hours. CBC: Recent Labs  Lab 03/03/17 2024 03/04/17 0613  WBC 5.3 8.3  NEUTROABS 3.7  --   HGB 13.0 11.7*  HCT 40.0 36.6*  MCV 94.1 93.8  PLT 226 220   Cardiac Enzymes: Recent Labs  Lab 03/03/17 2024 03/03/17 2349 03/04/17 0613 03/04/17 1219  TROPONINI 0.10* 0.08* 0.09* 0.10*   BNP: Invalid input(s): POCBNP CBG: No results for input(s): GLUCAP in the last 168 hours. D-Dimer No results for input(s): DDIMER in the last 72 hours. Hgb A1c No results for input(s): HGBA1C in the last 72 hours. Lipid Profile Recent Labs    03/04/17 0613  CHOL 114  HDL 43  LDLCALC 62  TRIG 45  CHOLHDL 2.7   Thyroid function studies Recent Labs    03/03/17 2349  TSH 0.653   Anemia work up No results for input(s): VITAMINB12, FOLATE, FERRITIN, TIBC,  IRON, RETICCTPCT in the last 72 hours. Urinalysis    Component Value Date/Time   COLORURINE YELLOW 03/03/2017 1827   APPEARANCEUR CLEAR 03/03/2017 1827   LABSPEC 1.012 03/03/2017 1827   PHURINE 7.0 03/03/2017 1827   GLUCOSEU NEGATIVE 03/03/2017 1827   HGBUR NEGATIVE 03/03/2017 1827   BILIRUBINUR NEGATIVE 03/03/2017 1827   KETONESUR NEGATIVE 03/03/2017 1827   PROTEINUR NEGATIVE 03/03/2017 1827   UROBILINOGEN 0.2 04/26/2014 0930   NITRITE NEGATIVE 03/03/2017 1827   LEUKOCYTESUR NEGATIVE 03/03/2017 1827   Sepsis Labs Invalid input(s): PROCALCITONIN,  WBC,  LACTICIDVEN Microbiology No results found for this or any previous visit (from the past 240 hour(s)).   Time coordinating discharge: Over 30 minutes  SIGNED:   Kathie Dike, MD  [facility] 03/06/2017, 11:41 AM Pager   If 7PM-7AM, please contact night-coverage [email or web address] Password [email or web address]

## 2017-03-15 DIAGNOSIS — I739 Peripheral vascular disease, unspecified: Secondary | ICD-10-CM | POA: Diagnosis not present

## 2017-03-15 DIAGNOSIS — L110 Acquired keratosis follicularis: Secondary | ICD-10-CM | POA: Diagnosis not present

## 2017-03-15 DIAGNOSIS — B351 Tinea unguium: Secondary | ICD-10-CM | POA: Diagnosis not present

## 2017-03-19 ENCOUNTER — Other Ambulatory Visit: Payer: Self-pay | Admitting: Family Medicine

## 2017-03-24 DIAGNOSIS — M4800 Spinal stenosis, site unspecified: Secondary | ICD-10-CM | POA: Diagnosis not present

## 2017-03-24 DIAGNOSIS — J449 Chronic obstructive pulmonary disease, unspecified: Secondary | ICD-10-CM | POA: Diagnosis not present

## 2017-03-24 DIAGNOSIS — I10 Essential (primary) hypertension: Secondary | ICD-10-CM | POA: Diagnosis not present

## 2017-03-26 ENCOUNTER — Ambulatory Visit (INDEPENDENT_AMBULATORY_CARE_PROVIDER_SITE_OTHER): Payer: Medicare Other | Admitting: Vascular Surgery

## 2017-03-26 ENCOUNTER — Encounter: Payer: Self-pay | Admitting: Vascular Surgery

## 2017-03-26 ENCOUNTER — Other Ambulatory Visit: Payer: Self-pay

## 2017-03-26 DIAGNOSIS — I739 Peripheral vascular disease, unspecified: Secondary | ICD-10-CM

## 2017-03-26 DIAGNOSIS — I6523 Occlusion and stenosis of bilateral carotid arteries: Secondary | ICD-10-CM

## 2017-03-26 DIAGNOSIS — I779 Disorder of arteries and arterioles, unspecified: Secondary | ICD-10-CM | POA: Insufficient documentation

## 2017-03-26 NOTE — Progress Notes (Signed)
New Carotid Patient  Requested by:  Kathie Dike, MD [address] [place], [place] [address]  Reason for consultation: CVA   History of Present Illness   [name] is a 83 y.o. (Mar 23, 1934) male who presents with chief complaint: prior stroke.  This patient previously had a CVA, leading to left sided weakness.  He has some residual neurologic loss that lead extended rehab.  Reportedly due to deconditioning he has not walked for the last 5+ years.  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  Patient previously offered surgery for such but refused such.  Recently he had a seizure which lead to further work up of his prior bilateral cerebellar strokes.  Previous carotid studies demonstrated: RICA <60% stenosis, LICA >45% stenosis.  The patient has never had amaurosis fugax or monocular blindness.  The patient has left leg weakness.  The patient has never had receptive or expressive aphasia.   Patient is unable to walk at this point and does not note any arm weakness.  The patient's risks factors for carotid disease include: HTN, HLD and active smoking.  Past Medical History:  Diagnosis Date  . Abnormality of gait 08/01/2013  . Arthritis   . Blindness of left eye    Posttraumatic  . Carotid artery disease ([place])   . Colon polyp   . Constipation   . Diaphragmatic hernia   . Disc disease, degenerative, cervical   . Diverticulitis   . ED (erectile dysfunction)   . Gastroparesis   . GERD (gastroesophageal reflux disease)   . Glaucoma   . Heart disease   . Hyperlipidemia   . Hypertension   . Insomnia   . Memory difficulty 08/04/2014  . Nicotine dependence   . Numbness and tingling of right arm 08/04/2014  . Osteoarthritis   . Overactive bladder   . Radiculopathy   . Shortness of breath    with activity  . Sleep apnea    does not use CPAP.  Tested maybe 5- 10 years ago  . Stroke [facility])    Left side weakness.  . Unilateral inguinal hernia      Past Surgical History:  Procedure Laterality Date  . ANTERIOR CERVICAL DECOMP/DISCECTOMY FUSION N/A 07/01/2012   Procedure: ANTERIOR CERVICAL DECOMPRESSION/DISCECTOMY FUSION CERVICAL FIVE-SIX [name] REMOVAL CERVICAL SIX-SEVEN;  Surgeon: [name], MD;  Location: [facility];  Service: Neurosurgery;  Laterality: N/A;  . CERVICAL FUSION    . COLONOSCOPY   08/04/2002     RMR: Normal rectum/Pancolonic diverticula/Colonic polyps as described above, biopsied and/or snared  . COLONOSCOPY  60/08/2009   RMR: normal rectum/left and right sided diverticula/multiple colonic polyps. tubular adenomas. surveillance due 2014  . COLONOSCOPY WITH ESOPHAGOGASTRODUODENOSCOPY (EGD) N/A 12/18/2012   Dr. [name]:  Colonic diverticulosis. Multiple colonic polyps-hyperplastic polyps and tubular adenoma. Friable anal canal hemorrhoids. EGD with chronic atrophic gastritis  . ESOPHAGOGASTRODUODENOSCOPY  04/14/2002   WUJ:[id number]'G' ring, status post dilation as described above/Hiatal hernia, focal antral erosions of uncertain clinical significance  . ESOPHAGOGASTRODUODENOSCOPY  06/09/2009   RMR: normal esophagus s/p dilator/small hiatal hernia otherwise normal  . HERNIA REPAIR Right    inguinal  . INGUINAL HERNIA REPAIR Left 04/15/2014   Procedure: LEFT INGUINAL HERNIORRHAPHY WITH MESH;  Surgeon: [name], MD;  Location: [facility];  Service: General;  Laterality: Left;  . INSERTION OF MESH Left 04/15/2014   Procedure: INSERTION OF MESH;  Surgeon: [name], MD;  Location: [facility];  Service: General;  Laterality: Left;    Social History   Socioeconomic History  . Marital status: Married    Spouse name: Not on file  . Number of children: 2  . Years of education: hs  . Highest education level: Not on file  Occupational History  . Occupation: Retired    Fish farm manager: RETIRED  Social Needs  . Financial resource strain: Not on file  . Food insecurity:    Worry: Not on file    Inability: Not on file   . Transportation needs:    Medical: Not on file    Non-medical: Not on file  Tobacco Use  . Smoking status: Former Smoker    Packs/day: 0.50    Years: 60.00    Pack years: 30.00    Types: Cigarettes  . Smokeless tobacco: Never Used  Substance and Sexual Activity  . Alcohol use: No  . Drug use: No  . Sexual activity: Not on file  Lifestyle  . Physical activity:    Days per week: Not on file    Minutes per session: Not on file  . Stress: Not on file  Relationships  . Social connections:    Talks on phone: Not on file    Gets together: Not on file    Attends religious service: Not on file    Active member of club or organization: Not on file    Attends meetings of clubs or organizations: Not on file    Relationship status: Not on file  . Intimate partner violence:    Fear of current or ex partner: Not on file    Emotionally abused: Not on file    Physically abused: Not on file    Forced sexual activity: Not on file  Other Topics Concern  . Not on file  Social History Narrative   Patient drinks 1-2 cups of caffeine daily.   Patient is right handed.    Family History  Problem Relation Age of Onset  . Cancer Mother   . Dementia Father   . Colon cancer Neg Hx     Current Outpatient Medications  Medication Sig Dispense Refill  . albuterol (PROVENTIL HFA;VENTOLIN HFA) 108 (90 Base) MCG/ACT inhaler Inhale 2 puffs into the lungs every 4 (four) hours as needed for wheezing or shortness of breath. 1 Inhaler 0  . aspirin EC 81 MG tablet Take 162 mg by mouth at bedtime.     . Cholecalciferol (VITAMIN D-3) 1000 units CAPS TAKE 1 CAPSULE BY MOUTH ONCE DAILY. 90 capsule 0  . citalopram (CELEXA) 20 MG tablet TAKE 1 TABLET BY MOUTH ONCE A DAY FOR DEPRESSION. 90 tablet 3  . DAILY MULTIPLE VITAMINS tablet TAKE 1 TABLET BY MOUTH ONCE DAILY. 90 tablet 0  . donepezil (ARICEPT) 10 MG tablet TAKE (1) TABLET BY MOUTH AT BEDTIME. 90 tablet 0  . fluticasone (FLONASE) 50 MCG/ACT nasal spray  USE 1 OR 2 SPRAYS IN EACH NOSTRIL EVERY DAY FOR NASAL ALLERGIES. 48 g 3  . HYDROcodone-acetaminophen (NORCO/VICODIN) 5-325 MG tablet TAKE 1 TABLET BY MOUTH THREE TIMES DAILY AS NEEDED FOR MODERATE PAIN 90 tablet 0  . levETIRAcetam (KEPPRA) 250 MG tablet TAKE (1) TABLET BY MOUTH EACH MORNING. 60 tablet 0  . lisinopril (PRINIVIL,ZESTRIL) 10 MG tablet Take 10 mg by mouth daily.    [name] LORazepam (ATIVAN) 1 MG tablet TAKE (1) TABLET BY MOUTH AT BEDTIME. 30 tablet 1  . lubiprostone (AMITIZA) 24 MCG capsule Take 1 capsule (24 mcg total)  by mouth daily with breakfast. MAY TAKE ADDITIONAL DOSE QD PRN. 180 capsule 2  . mirtazapine (REMERON) 15 MG tablet TAKE 1/2 TABLET(7.5MG ) BY MOUTH AT BEDTIME FOR APPETITE. 15 tablet 0  . Multiple Vitamin (MULTIVITAMIN WITH MINERALS) TABS tablet Take 1 tablet by mouth daily. 90 tablet 3  . naproxen sodium (ALEVE) 220 MG tablet Take 440 mg by mouth daily as needed.    . nitroGLYCERIN (NITROSTAT) 0.4 MG SL tablet Place 0.4 mg under the tongue every 5 (five) minutes as needed for chest pain.    [name] omeprazole (PRILOSEC) 20 MG capsule TAKE 1 CAPSULE BY MOUTH ONCE DAILY FOR RELFUX ESOPHAGITIS OR STOMACH ULCERS. 30 capsule 0  . traZODone (DESYREL) 100 MG tablet TAKE (1) TABLET BY MOUTH AT BEDTIME. 30 tablet 0  . VESICARE 5 MG tablet TAKE ONE TABLET ONCE DAILY FOR BLADDER. 30 tablet 0  . amoxicillin-clavulanate (AUGMENTIN) 875-125 MG tablet Take 1 tablet by mouth 2 (two) times daily. (Patient not taking: Reported on 03/26/2017) 10 tablet 0   No current facility-administered medications for this visit.     Allergies  Allergen Reactions  . Aspirin Nausea And Vomiting    Tolerates 81mg  daily    REVIEW OF SYSTEMS (negative unless checked):   Cardiac:  [x]  Chest pain or chest pressure? [x]  Shortness of breath upon activity? [x]  Shortness of breath when lying flat? []  Irregular heart rhythm?  Vascular:  [x]  Pain in calf, thigh, or hip brought on by walking? []  Pain in feet  at night that wakes you up from your sleep? []  Blood clot in your veins? [x]  Leg swelling?  Pulmonary:  []  Oxygen at home? [x]  Productive cough? [x]  Wheezing?  Neurologic:  [x]  Sudden weakness in arms or legs? []  Sudden numbness in arms or legs? []  Sudden onset of difficult speaking or slurred speech? []  Temporary loss of vision in one eye? []  Problems with dizziness?  Gastrointestinal:  []  Blood in stool? []  Vomited blood?  Genitourinary:  []  Burning when urinating? []  Blood in urine?  Psychiatric:  [x]  Major depression  Hematologic:  []  Bleeding problems? []  Problems with blood clotting?  Dermatologic:  []  Rashes or ulcers?  Constitutional:  []  Fever or chills?  Ear/Nose/Throat:  []  Change in hearing? []  Nose bleeds? []  Sore throat?  Musculoskeletal:  []  Back pain? []  Joint pain? []  Muscle pain?   For VQI Use Only   PRE-ADM LIVING Home  AMB STATUS Ambulatory  CAD Sx None  PRIOR CHF None  STRESS TEST No    Physical Examination     Vitals:   03/26/17 1330 03/26/17 1334  BP: 138/76 139/65  Pulse: 60 63  Resp: 18   Temp: 98.2 F (36.8 C)   TempSrc: Oral   SpO2: 98%   Height: 5\' 9"  (1.753 m)    Body mass index is 23.54 kg/m.  General Alert, O x 3, WD, NAD  Head [place]/AT,    Ear/Nose/ Throat Hearing grossly intact, nares without erythema or drainage, oropharynx without Erythema or Exudate, Mallampati score: 3,   Eyes PERRLA, EOMI,    Neck Supple, mid-line trachea,    Pulmonary Sym exp, good B air movt, CTA B  Cardiac RRR, Nl S1, S2, no Murmurs, No rubs, No S3,S4  Vascular Vessel Right Left  Radial Palpable Palpable  Brachial Palpable Palpable  Carotid Palpable, No Bruit Palpable, No Bruit  Aorta Not palpable N/A  Femoral Palpable Palpable  Popliteal Not palpable Not palpable  PT Not palpable Not palpable  DP Palpable Palpable    Gastro- intestinal soft, non-distended, non-tender to palpation, No guarding or rebound, no HSM, no  masses, no CVAT B, No palpable prominent aortic pulse,    Musculo- skeletal M/S 5/5 throughout BUE, BLE 2-3/5, Extremities without ischemic changes  , No edema present, No visible varicosities , No Lipodermatosclerosis present  Neurologic Cranial nerves 2-12 intact , Pain and light touch intact in extremities , Motor exam as listed above  Psychiatric Judgement intact, Mood & affect appropriate for pt's clinical situation  Dermatologic See M/S exam for extremity exam, No rashes otherwise noted  Lymphatic  Palpable lymph nodes: None    Radiology     MRI/MRA Head (03/05/17) 1. No acute intracranial finding. 2. Atrophy and remote posterior fossa infarcts that are stable since 2016. 3. Pansinusitis. 4. Negative intracranial MRA.  CTA Neck (03/04/16)  IMPRESSION: 1. Moderate short-segment atheromatous stenosis measuring up to 60% by NASCET criteria at the proximal left ICA. 2. Mild atheromatous stenosis of no more than 25% involving the proximal right ICA. 3. Widely patent vertebral arteries within the neck. 4. Acute pan sinusitis.  CT CHEST IMPRESSION:  1. Patchy multifocal upper lobe predominant ground-glass opacities involving the bilateral lungs as above, suspicious for multifocal pneumonia. Radiographic follow-up to resolution recommended. 2. Small layering right pleural effusion with associated atelectasis. 3. Bilateral pulmonary nodules measuring up to 8 mm as above, indeterminate, but may be infectious in nature. Attention at follow-up recommended. 4. Aortic atherosclerosis with diffuse 3 vessel coronary artery calcifications. 5. **An incidental finding of potential clinical significance has been found. Dilatation of the ascending aorta up to 4 cm in diameter  Based on my review of this patient's CTA neck, he has minimal disease in the R ICA and >50% disease in L ICA with complex plaque.   Outside Studies/Documentation   20 pages of outside documents were reviewed  including: EMR recent ED notes.   Medical Decision Making   [name] is a 83 y.o. male who presents with: history of prior cerebellar CVA, minimal R ICA stenosis, 60% L ICA stenosis, new onset seizure, known prior HNP causing R arm weakness   Given prior history of known R arm weakness from HNP, it is hard to jump to calling recurrent R arm numbness as a TIA. If Dr. Jannifer Franklin feels recent R arm sx are related to L ICA stenosis, might consider L CEA, otherwise I would just watch the L ICA with q6 month duplex. I discussed in depth with the patient the nature of atherosclerosis, and emphasized the importance of maximal medical management including strict control of blood pressure, blood glucose, and lipid levels, obtaining regular exercise, antiplatelet agents, and cessation of smoking.   The patient is currently not on on statin as not medically indicated.  The patient is currently on an anti-platelet: ASA.  The patient is aware that without maximal medical management the underlying atherosclerotic disease process will progress, limiting the benefit of any interventions.  Thank you for allowing [place] to participate in this patient's care.   Adele Barthel, MD, FACS [facility] Office: [phone] Pager: [phone]  03/26/2017, 2:08 PM

## 2017-04-13 ENCOUNTER — Other Ambulatory Visit: Payer: Self-pay | Admitting: Family Medicine

## 2017-04-17 ENCOUNTER — Other Ambulatory Visit: Payer: Self-pay | Admitting: Family Medicine

## 2017-04-17 NOTE — Telephone Encounter (Signed)
Ok to refill??  Last office visit 02/27/2017.  Last refill 02/23/2017.

## 2017-04-19 ENCOUNTER — Telehealth: Payer: Self-pay | Admitting: Family Medicine

## 2017-04-19 NOTE — Telephone Encounter (Signed)
Call placed to patient and wife.   Reports that patient has been having boil like areas pop up all over his body x2 weeks and blisters to hands. States that blister was noted to L index finger on 04/17/2017 and she popped it with a needle. States that today his hand is swollen and red tinged and warm to the touch.   Advised to take patient to ER for evaluation and possible IV ABTx. Advised that patient quickly becomes septic and needs to be seen at ER not UC.   MD made aware.

## 2017-04-19 NOTE — Telephone Encounter (Signed)
[name] called about [name] stating that he has broken out in blisters or boils all over his hands/hips/ and under his arms. His hand is completely swollen to the point of not being able to use it. She wanted an app for today I notified her that we do not have any openings left and that she probably needed to take him to U/C or ER and she was not having it. Please call pt to advise.

## 2017-04-22 ENCOUNTER — Emergency Department (HOSPITAL_COMMUNITY): Payer: Medicare Other

## 2017-04-22 ENCOUNTER — Other Ambulatory Visit: Payer: Self-pay

## 2017-04-22 ENCOUNTER — Encounter (HOSPITAL_COMMUNITY): Payer: Self-pay | Admitting: Emergency Medicine

## 2017-04-22 ENCOUNTER — Inpatient Hospital Stay (HOSPITAL_COMMUNITY)
Admission: EM | Admit: 2017-04-22 | Discharge: 2017-04-25 | DRG: 580 | Disposition: A | Discharge status: Home / Self Care | Payer: Medicare Other | Attending: Internal Medicine | Admitting: Internal Medicine

## 2017-04-22 DIAGNOSIS — F0390 Unspecified dementia without behavioral disturbance: Secondary | ICD-10-CM | POA: Diagnosis present

## 2017-04-22 DIAGNOSIS — F5104 Psychophysiologic insomnia: Secondary | ICD-10-CM | POA: Diagnosis present

## 2017-04-22 DIAGNOSIS — R569 Unspecified convulsions: Secondary | ICD-10-CM

## 2017-04-22 DIAGNOSIS — Z66 Do not resuscitate: Secondary | ICD-10-CM | POA: Diagnosis not present

## 2017-04-22 DIAGNOSIS — L03113 Cellulitis of right upper limb: Principal | ICD-10-CM

## 2017-04-22 DIAGNOSIS — L0291 Cutaneous abscess, unspecified: Secondary | ICD-10-CM | POA: Diagnosis not present

## 2017-04-22 DIAGNOSIS — Z86010 Personal history of colonic polyps: Secondary | ICD-10-CM | POA: Diagnosis not present

## 2017-04-22 DIAGNOSIS — E785 Hyperlipidemia, unspecified: Secondary | ICD-10-CM | POA: Diagnosis present

## 2017-04-22 DIAGNOSIS — M4800 Spinal stenosis, site unspecified: Secondary | ICD-10-CM | POA: Diagnosis not present

## 2017-04-22 DIAGNOSIS — M7989 Other specified soft tissue disorders: Secondary | ICD-10-CM | POA: Diagnosis not present

## 2017-04-22 DIAGNOSIS — N529 Male erectile dysfunction, unspecified: Secondary | ICD-10-CM | POA: Diagnosis present

## 2017-04-22 DIAGNOSIS — M1990 Unspecified osteoarthritis, unspecified site: Secondary | ICD-10-CM | POA: Diagnosis present

## 2017-04-22 DIAGNOSIS — D649 Anemia, unspecified: Secondary | ICD-10-CM | POA: Diagnosis not present

## 2017-04-22 DIAGNOSIS — Z886 Allergy status to analgesic agent status: Secondary | ICD-10-CM

## 2017-04-22 DIAGNOSIS — G4730 Sleep apnea, unspecified: Secondary | ICD-10-CM | POA: Diagnosis present

## 2017-04-22 DIAGNOSIS — H5462 Unqualified visual loss, left eye, normal vision right eye: Secondary | ICD-10-CM | POA: Diagnosis present

## 2017-04-22 DIAGNOSIS — F0150 Vascular dementia without behavioral disturbance: Secondary | ICD-10-CM

## 2017-04-22 DIAGNOSIS — R32 Unspecified urinary incontinence: Secondary | ICD-10-CM | POA: Diagnosis present

## 2017-04-22 DIAGNOSIS — Z87891 Personal history of nicotine dependence: Secondary | ICD-10-CM

## 2017-04-22 DIAGNOSIS — N3281 Overactive bladder: Secondary | ICD-10-CM | POA: Diagnosis present

## 2017-04-22 DIAGNOSIS — G40909 Epilepsy, unspecified, not intractable, without status epilepticus: Secondary | ICD-10-CM | POA: Diagnosis present

## 2017-04-22 DIAGNOSIS — L02511 Cutaneous abscess of right hand: Secondary | ICD-10-CM | POA: Diagnosis not present

## 2017-04-22 DIAGNOSIS — F321 Major depressive disorder, single episode, moderate: Secondary | ICD-10-CM | POA: Diagnosis present

## 2017-04-22 DIAGNOSIS — K219 Gastro-esophageal reflux disease without esophagitis: Secondary | ICD-10-CM

## 2017-04-22 DIAGNOSIS — L0390 Cellulitis, unspecified: Secondary | ICD-10-CM | POA: Diagnosis present

## 2017-04-22 DIAGNOSIS — Z791 Long term (current) use of non-steroidal anti-inflammatories (NSAID): Secondary | ICD-10-CM

## 2017-04-22 DIAGNOSIS — Z7951 Long term (current) use of inhaled steroids: Secondary | ICD-10-CM

## 2017-04-22 DIAGNOSIS — Z23 Encounter for immunization: Secondary | ICD-10-CM | POA: Diagnosis not present

## 2017-04-22 DIAGNOSIS — Z7982 Long term (current) use of aspirin: Secondary | ICD-10-CM | POA: Diagnosis not present

## 2017-04-22 DIAGNOSIS — L02413 Cutaneous abscess of right upper limb: Secondary | ICD-10-CM | POA: Diagnosis not present

## 2017-04-22 DIAGNOSIS — I739 Peripheral vascular disease, unspecified: Secondary | ICD-10-CM | POA: Diagnosis not present

## 2017-04-22 DIAGNOSIS — I10 Essential (primary) hypertension: Secondary | ICD-10-CM | POA: Diagnosis not present

## 2017-04-22 DIAGNOSIS — G8929 Other chronic pain: Secondary | ICD-10-CM | POA: Diagnosis present

## 2017-04-22 DIAGNOSIS — K3184 Gastroparesis: Secondary | ICD-10-CM | POA: Diagnosis present

## 2017-04-22 DIAGNOSIS — L03119 Cellulitis of unspecified part of limb: Secondary | ICD-10-CM | POA: Diagnosis present

## 2017-04-22 DIAGNOSIS — L089 Local infection of the skin and subcutaneous tissue, unspecified: Secondary | ICD-10-CM | POA: Diagnosis not present

## 2017-04-22 DIAGNOSIS — J449 Chronic obstructive pulmonary disease, unspecified: Secondary | ICD-10-CM | POA: Diagnosis not present

## 2017-04-22 DIAGNOSIS — S60511A Abrasion of right hand, initial encounter: Secondary | ICD-10-CM | POA: Diagnosis not present

## 2017-04-22 DIAGNOSIS — H409 Unspecified glaucoma: Secondary | ICD-10-CM | POA: Diagnosis not present

## 2017-04-22 DIAGNOSIS — Z79899 Other long term (current) drug therapy: Secondary | ICD-10-CM

## 2017-04-22 LAB — CBC WITH DIFFERENTIAL/PLATELET
BASOS ABS: 0.1 10*3/uL (ref 0.0–0.1)
BASOS PCT: 1 %
EOS ABS: 1.3 10*3/uL — AB (ref 0.0–0.7)
Eosinophils Relative: 13 %
HCT: 37.2 % — ABNORMAL LOW (ref 39.0–52.0)
HEMOGLOBIN: 12.2 g/dL — AB (ref 13.0–17.0)
Lymphocytes Relative: 16 %
Lymphs Abs: 1.5 10*3/uL (ref 0.7–4.0)
MCH: 30.7 pg (ref 26.0–34.0)
MCHC: 32.8 g/dL (ref 30.0–36.0)
MCV: 93.7 fL (ref 78.0–100.0)
Monocytes Absolute: 1 10*3/uL (ref 0.1–1.0)
Monocytes Relative: 11 %
NEUTROS ABS: 5.8 10*3/uL (ref 1.7–7.7)
NEUTROS PCT: 59 %
Platelets: 173 10*3/uL (ref 150–400)
RBC: 3.97 MIL/uL — ABNORMAL LOW (ref 4.22–5.81)
RDW: 14.3 % (ref 11.5–15.5)
WBC: 9.7 10*3/uL (ref 4.0–10.5)

## 2017-04-22 LAB — CBC
HCT: 30 % — ABNORMAL LOW (ref 39.0–52.0)
HEMOGLOBIN: 9.3 g/dL — AB (ref 13.0–17.0)
MCH: 29.9 pg (ref 26.0–34.0)
MCHC: 31 g/dL (ref 30.0–36.0)
MCV: 96.5 fL (ref 78.0–100.0)
Platelets: 198 10*3/uL (ref 150–400)
RBC: 3.11 MIL/uL — AB (ref 4.22–5.81)
RDW: 15.9 % — ABNORMAL HIGH (ref 11.5–15.5)
WBC: 9 10*3/uL (ref 4.0–10.5)

## 2017-04-22 LAB — COMPREHENSIVE METABOLIC PANEL
ALBUMIN: 3.2 g/dL — AB (ref 3.5–5.0)
ALK PHOS: 87 U/L (ref 38–126)
ALT: 12 U/L — ABNORMAL LOW (ref 17–63)
AST: 22 U/L (ref 15–41)
Anion gap: 12 (ref 5–15)
BILIRUBIN TOTAL: 0.8 mg/dL (ref 0.3–1.2)
BUN: 20 mg/dL (ref 6–20)
CO2: 23 mmol/L (ref 22–32)
Calcium: 8.9 mg/dL (ref 8.9–10.3)
Chloride: 104 mmol/L (ref 101–111)
Creatinine, Ser: 1.17 mg/dL (ref 0.61–1.24)
GFR calc Af Amer: >60 mL/min (ref >60)
GFR calc non Af Amer: 57 mL/min — ABNORMAL LOW (ref >60)
GLUCOSE: 124 mg/dL — AB (ref 65–99)
POTASSIUM: 3.4 mmol/L — AB (ref 3.5–5.1)
SODIUM: 139 mmol/L (ref 135–145)
TOTAL PROTEIN: 7.6 g/dL (ref 6.5–8.1)

## 2017-04-22 LAB — LACTIC ACID, PLASMA
Lactic Acid, Venous: 1.4 mmol/L (ref 0.5–1.9)
Lactic Acid, Venous: 1.2 mmol/L (ref 0.5–1.9)

## 2017-04-22 MED ORDER — LEVETIRACETAM 250 MG PO TABS
250.0000 mg | ORAL_TABLET | Freq: Two times a day (BID) | ORAL | Status: DC
  Administered 2017-04-22 – 2017-04-25 (×5): 250 mg via ORAL
  Filled 2017-04-22 (×5): qty 1

## 2017-04-22 MED ORDER — DONEPEZIL HCL 10 MG PO TABS
10.0000 mg | ORAL_TABLET | Freq: Every day | ORAL | Status: DC
  Administered 2017-04-22 – 2017-04-24 (×3): 10 mg via ORAL
  Filled 2017-04-22 (×3): qty 1

## 2017-04-22 MED ORDER — HYDROCODONE-ACETAMINOPHEN 5-325 MG PO TABS
1.0000 | ORAL_TABLET | Freq: Four times a day (QID) | ORAL | Status: DC | PRN
  Administered 2017-04-23: 1 via ORAL
  Filled 2017-04-22: qty 1

## 2017-04-22 MED ORDER — TETANUS-DIPHTH-ACELL PERTUSSIS 5-2.5-18.5 LF-MCG/0.5 IM SUSP
INTRAMUSCULAR | Status: AC
  Filled 2017-04-22: qty 0.5

## 2017-04-22 MED ORDER — CITALOPRAM HYDROBROMIDE 20 MG PO TABS
20.0000 mg | ORAL_TABLET | Freq: Every day | ORAL | Status: DC
  Administered 2017-04-24 – 2017-04-25 (×2): 20 mg via ORAL
  Filled 2017-04-22 (×2): qty 1

## 2017-04-22 MED ORDER — ONDANSETRON HCL 4 MG PO TABS: 4.0000 mg | ORAL_TABLET | Freq: Four times a day (QID) | ORAL | Status: DC | PRN

## 2017-04-22 MED ORDER — ENOXAPARIN SODIUM 40 MG/0.4ML ~~LOC~~ SOLN
40.0000 mg | SUBCUTANEOUS | Status: DC
  Administered 2017-04-22 – 2017-04-24 (×3): 40 mg via SUBCUTANEOUS
  Filled 2017-04-22 (×3): qty 0.4

## 2017-04-22 MED ORDER — ACETAMINOPHEN 325 MG PO TABS: 650.0000 mg | ORAL_TABLET | Freq: Four times a day (QID) | ORAL | Status: DC | PRN

## 2017-04-22 MED ORDER — TETANUS-DIPHTH-ACELL PERTUSSIS 5-2.5-18.5 LF-MCG/0.5 IM SUSP
0.5000 mL | Freq: Once | INTRAMUSCULAR | Status: AC
  Administered 2017-04-22: 0.5 mL via INTRAMUSCULAR

## 2017-04-22 MED ORDER — DARIFENACIN HYDROBROMIDE ER 7.5 MG PO TB24
7.5000 mg | ORAL_TABLET | Freq: Every day | ORAL | Status: DC
  Administered 2017-04-24 – 2017-04-25 (×2): 7.5 mg via ORAL
  Filled 2017-04-22 (×3): qty 1

## 2017-04-22 MED ORDER — VANCOMYCIN HCL 10 G IV SOLR
1250.0000 mg | INTRAVENOUS | Status: DC
  Administered 2017-04-23 – 2017-04-24 (×2): 1250 mg via INTRAVENOUS
  Filled 2017-04-22 (×3): qty 1250

## 2017-04-22 MED ORDER — PANTOPRAZOLE SODIUM 40 MG PO TBEC
40.0000 mg | DELAYED_RELEASE_TABLET | Freq: Every day | ORAL | Status: DC
  Administered 2017-04-24 – 2017-04-25 (×2): 40 mg via ORAL
  Filled 2017-04-22 (×2): qty 1

## 2017-04-22 MED ORDER — VANCOMYCIN HCL 10 G IV SOLR
1500.0000 mg | Freq: Once | INTRAVENOUS | Status: AC
  Administered 2017-04-22: 1500 mg via INTRAVENOUS
  Filled 2017-04-22: qty 1500

## 2017-04-22 MED ORDER — LORAZEPAM 1 MG PO TABS
1.0000 mg | ORAL_TABLET | Freq: Every day | ORAL | Status: DC
  Administered 2017-04-22 – 2017-04-24 (×3): 1 mg via ORAL
  Filled 2017-04-22 (×3): qty 1

## 2017-04-22 MED ORDER — KETOROLAC TROMETHAMINE 15 MG/ML IJ SOLN
15.0000 mg | Freq: Four times a day (QID) | INTRAMUSCULAR | Status: AC
  Administered 2017-04-22 – 2017-04-24 (×7): 15 mg via INTRAVENOUS
  Filled 2017-04-22 (×7): qty 1

## 2017-04-22 MED ORDER — LISINOPRIL 10 MG PO TABS
10.0000 mg | ORAL_TABLET | Freq: Every day | ORAL | Status: DC
  Administered 2017-04-24 – 2017-04-25 (×2): 10 mg via ORAL
  Filled 2017-04-22 (×2): qty 1

## 2017-04-22 MED ORDER — TRAZODONE HCL 100 MG PO TABS
100.0000 mg | ORAL_TABLET | Freq: Every day | ORAL | Status: DC
  Administered 2017-04-22 – 2017-04-24 (×3): 100 mg via ORAL
  Filled 2017-04-22 (×3): qty 1

## 2017-04-22 MED ORDER — PIPERACILLIN-TAZOBACTAM 3.375 G IVPB 30 MIN
3.3750 g | Freq: Once | INTRAVENOUS | Status: AC
  Administered 2017-04-22: 3.375 g via INTRAVENOUS
  Filled 2017-04-22: qty 50

## 2017-04-22 MED ORDER — VANCOMYCIN HCL IN DEXTROSE 1-5 GM/200ML-% IV SOLN: 1000.0000 mg | Freq: Once | INTRAVENOUS | Status: DC

## 2017-04-22 MED ORDER — MIRTAZAPINE 15 MG PO TABS
7.5000 mg | ORAL_TABLET | Freq: Every day | ORAL | Status: DC
  Administered 2017-04-22 – 2017-04-24 (×3): 7.5 mg via ORAL
  Filled 2017-04-22 (×3): qty 1

## 2017-04-22 MED ORDER — LACTATED RINGERS IV BOLUS
1000.0000 mL | Freq: Once | INTRAVENOUS | Status: AC
  Administered 2017-04-22: 1000 mL via INTRAVENOUS

## 2017-04-22 MED ORDER — ACETAMINOPHEN 650 MG RE SUPP: 650.0000 mg | Freq: Four times a day (QID) | RECTAL | Status: DC | PRN

## 2017-04-22 MED ORDER — LUBIPROSTONE 24 MCG PO CAPS
24.0000 ug | ORAL_CAPSULE | Freq: Every day | ORAL | Status: DC
  Administered 2017-04-24 – 2017-04-25 (×2): 24 ug via ORAL
  Filled 2017-04-22 (×3): qty 1

## 2017-04-22 MED ORDER — ONDANSETRON HCL 4 MG/2ML IJ SOLN: 4.0000 mg | Freq: Four times a day (QID) | INTRAMUSCULAR | Status: DC | PRN

## 2017-04-22 MED ORDER — PNEUMOCOCCAL VAC POLYVALENT 25 MCG/0.5ML IJ INJ
0.5000 mL | INJECTION | INTRAMUSCULAR | Status: DC
  Filled 2017-04-22: qty 0.5

## 2017-04-22 MED ORDER — ASPIRIN EC 81 MG PO TBEC
162.0000 mg | DELAYED_RELEASE_TABLET | Freq: Every day | ORAL | Status: DC
  Administered 2017-04-23 – 2017-04-24 (×2): 162 mg via ORAL
  Filled 2017-04-22 (×2): qty 2

## 2017-04-22 NOTE — ED Notes (Signed)
Left hand noted to be swollen red and warm across knuckles and on top of hand. 2 ulcerated areas noted ring finger knuckle and and opening to top of finger

## 2017-04-22 NOTE — ED Provider Notes (Signed)
Emergency Department Provider Note   I have reviewed the triage vital signs and the nursing notes.   HISTORY  Chief Complaint Abscess   HPI [name] is a 83 y.o. male multiple medical problems as documented below the presents to the emergency department today secondary to hand swelling.  Patient states he had a blister on his right hand earlier in the week and used a hot needle to poke it so that was drained.  At that time all the drained out was clear fluid since that time it started draining more purulent material.  He has had worsening redness that goes up his arm.  Worsening pain.  Some chills but no measured fevers.  No history of the same.  Has not tried any for the symptoms. No other associated or modifying symptoms.    Past Medical History:  Diagnosis Date  . Abnormality of gait 08/01/2013  . Arthritis   . Blindness of left eye    Posttraumatic  . Carotid artery disease ([place])   . Colon polyp   . Constipation   . Diaphragmatic hernia   . Disc disease, degenerative, cervical   . Diverticulitis   . ED (erectile dysfunction)   . Gastroparesis   . GERD (gastroesophageal reflux disease)   . Glaucoma   . Heart disease   . Hyperlipidemia   . Hypertension   . Insomnia   . Memory difficulty 08/04/2014  . Nicotine dependence   . Numbness and tingling of right arm 08/04/2014  . Osteoarthritis   . Overactive bladder   . Radiculopathy   . Shortness of breath    with activity  . Sleep apnea    does not use CPAP.  Tested maybe 5- 10 years ago  . Stroke [facility])    Left side weakness.  . Unilateral inguinal hernia     Patient Active Problem List   Diagnosis Date Noted  . Cellulitis of hand, right 04/22/2017  . Cellulitis 04/22/2017  . Cellulitis of hand 04/22/2017  . Carotid artery disease ([place]) 03/26/2017  . Internal carotid artery stenosis, left 03/06/2017  . Dysphagia 03/06/2017  . Pulmonary nodules 03/06/2017  . Pneumonia 03/03/2017  . Seizure ([place]) 03/03/2017    . Protein-calorie malnutrition ([place]) 01/10/2017  . Chronic pain 10/02/2016  . Depression, major, single episode, moderate ([place]) 08/31/2016  . Chronic insomnia 08/31/2016  . Lumbar spinal stenosis 08/18/2016  . Wheelchair bound 08/18/2016  . Dementia 08/18/2016  . Urinary incontinence 08/18/2016  . Polypharmacy 08/18/2016  . Memory difficulty 08/04/2014  . Numbness and tingling of right arm 08/04/2014  . Abnormality of gait 08/01/2013  . Anemia 12/05/2012  . Early satiety 12/03/2012  . History of colon polyps 12/03/2012  . Facial droop 10/12/2012  . Spinal stenosis of cervical region 07/01/2012  . GERD 05/27/2009  . DYSPHAGIA UNSPECIFIED 05/27/2009  . CHANGE IN BOWELS 05/27/2009  . SMOKER 09/09/2008  . Essential hypertension 09/09/2008  . History of stroke 09/09/2008  . Constipation 09/09/2008  . HIGH BLOOD PRESSURE 07/25/2006    Past Surgical History:  Procedure Laterality Date  . ANTERIOR CERVICAL DECOMP/DISCECTOMY FUSION N/A 07/01/2012   Procedure: ANTERIOR CERVICAL DECOMPRESSION/DISCECTOMY FUSION CERVICAL FIVE-SIX [name] REMOVAL CERVICAL SIX-SEVEN;  Surgeon: [name], MD;  Location: [place] [facility];  Service: Neurosurgery;  Laterality: N/A;  . CERVICAL FUSION    . COLONOSCOPY   08/04/2002     RMR: Normal rectum/Pancolonic diverticula/Colonic polyps as described above, biopsied and/or snared  . COLONOSCOPY  60/08/2009   RMR:  normal rectum/left and right sided diverticula/multiple colonic polyps. tubular adenomas. surveillance due 2014  . COLONOSCOPY WITH ESOPHAGOGASTRODUODENOSCOPY (EGD) N/A 12/18/2012   Dr. [name]:  Colonic diverticulosis. Multiple colonic polyps-hyperplastic polyps and tubular adenoma. Friable anal canal hemorrhoids. EGD with chronic atrophic gastritis  . ESOPHAGOGASTRODUODENOSCOPY  04/14/2002   JSH:FWYOVZCH'Y' ring, status post dilation as described above/Hiatal hernia, focal antral erosions of uncertain clinical significance  .  ESOPHAGOGASTRODUODENOSCOPY  06/09/2009   RMR: normal esophagus s/p dilator/small hiatal hernia otherwise normal  . HERNIA REPAIR Right    inguinal  . INGUINAL HERNIA REPAIR Left 04/15/2014   Procedure: LEFT INGUINAL HERNIORRHAPHY WITH MESH;  Surgeon: [name], MD;  Location: [facility];  Service: General;  Laterality: Left;  . INSERTION OF MESH Left 04/15/2014   Procedure: INSERTION OF MESH;  Surgeon: [name], MD;  Location: [facility];  Service: General;  Laterality: Left;    Current Outpatient Rx  . Order #: [id number] Class: Historical Med  . Order #: [id number] Class: Normal  . Order #: [id number] Class: Normal  . Order #: [id number] Class: Normal  . Order #: [id number] Class: Normal  . Order #: [id number] Class: Normal  . Order #: [id number] Class: Normal  . Order #: [id number] Class: Normal  . Order #: [id number] Class: Historical Med  . Order #: [id number] Class: Phone In  . Order #: [id number] Class: Normal  . Order #: [id number] Class: Normal  . Order #: [id number] Class: Print  . Order #: [id number] Class: Historical Med  . Order #: [id number] Class: Normal  . Order #: [id number] Class: Normal  . Order #: [id number] Class: Normal  . Order #: [id number] Class: Normal  . Order #: [id number] Class: Historical Med    Allergies Aspirin  Family History  Problem Relation Age of Onset  . Cancer Mother   . Dementia Father   . Colon cancer Neg Hx     Social History Social History   Tobacco Use  . Smoking status: Former Smoker    Packs/day: 0.50    Years: 60.00    Pack years: 30.00    Types: Cigarettes  . Smokeless tobacco: Never Used  Substance Use Topics  . Alcohol use: No  . Drug use: No    Review of Systems  All other systems negative except as documented in the HPI. All pertinent positives and negatives as reviewed in the HPI. ____________________________________________   PHYSICAL EXAM:  VITAL SIGNS: ED Triage Vitals  Enc Vitals Group     BP 04/22/17 1422 (!) 144/68     Pulse  Rate 04/22/17 1422 85     Resp 04/22/17 1422 20     Temp 04/22/17 1422 98.7 F (37.1 C)     Temp Source 04/22/17 1422 Temporal     SpO2 04/22/17 1422 97 %     Weight 04/22/17 1421 165 lb (74.8 kg)     Height 04/22/17 1421 5\' 10"  (1.778 m)     Head Circumference --      Peak Flow --      Pain Score 04/22/17 1421 9     Pain Loc --      Pain Edu? --      Excl. in [place]? --     Constitutional: Alert and oriented. Well appearing and in no acute distress. Eyes: Conjunctivae are normal. PERRL. EOMI. Head: Atraumatic. Nose: No congestion/rhinnorhea. Mouth/Throat: Mucous membranes are moist.  Oropharynx non-erythematous. Neck: No stridor.  No meningeal signs.   Cardiovascular: Normal rate, regular rhythm. Good peripheral circulation. Grossly normal heart sounds.   Respiratory: Normal respiratory effort.  No retractions.  Lungs CTAB. Gastrointestinal: Soft and nontender. No distention.  Musculoskeletal: No lower extremity tenderness nor edema. No gross deformities of extremities. Neurologic:  Normal speech and language. No gross focal neurologic deficits are appreciated.  Skin:  Has a draining abscess over right fourth MCP joint with erythema, edema, induration, warmth that spread 1/3 the way up his arm and encompass his whole hand. No streaking.   ____________________________________________   LABS (all labs ordered are listed, but only abnormal results are displayed)  Labs Reviewed  CBC WITH DIFFERENTIAL/PLATELET - Abnormal; Notable for the following components:      Result Value   RBC 3.97 (*)    Hemoglobin 12.2 (*)    HCT 37.2 (*)    Eosinophils Absolute 1.3 (*)    All other components within normal limits  COMPREHENSIVE METABOLIC PANEL - Abnormal; Notable for the following components:   Potassium 3.4 (*)    Glucose, Bld 124 (*)    Albumin 3.2 (*)    ALT 12 (*)    GFR calc non Af Amer 57 (*)    All other components within normal limits  CULTURE, BLOOD (ROUTINE X 2)  CULTURE,  BLOOD (ROUTINE X 2)  LACTIC ACID, PLASMA  LACTIC ACID, PLASMA   ____________________________________________  EKG  Abscess and cellulitis. Will start antibiotics. Already draining at this time, opened up a bit further.  ____________________________________________  RADIOLOGY  Dg Hand Complete Right  Result Date: 04/22/2017 CLINICAL DATA:  Large draining right hand abscess at the 4th MCP joint. EXAM: RIGHT HAND - COMPLETE 3+ VIEW COMPARISON:  None. FINDINGS: Dorsal soft tissue swelling, most pronounced at the level of the MCP joints. No soft tissue gas, bone destruction or periosteal reaction. No radiopaque foreign body. IMPRESSION: Dorsal soft tissue swelling without underlying bony abnormality. Electronically Signed   By: Claudie Revering M.D.   On: 04/22/2017 15:18    ____________________________________________   PROCEDURES  Procedure(s) performed:   Procedures   ____________________________________________   INITIAL IMPRESSION / ASSESSMENT AND PLAN / ED COURSE  Abscess and cellulitis. Already draining, opened up a bit more and significant purulent drainage resulted. Approximately 3/4 cm opening with good drainage currently. Will start antibiotics. 2/2 extent of infection and age will admit for iv antibiotics and further management.    Pertinent labs & imaging results that were available during my care of the patient were reviewed by me and considered in my medical decision making (see chart for details).  ____________________________________________  FINAL CLINICAL IMPRESSION(S) / ED DIAGNOSES  Final diagnoses:  Abscess  Cellulitis of right upper extremity     MEDICATIONS GIVEN DURING THIS VISIT:  Medications  vancomycin (VANCOCIN) 1,500 mg in sodium chloride 0.9 % 500 mL IVPB (0 mg Intravenous Stopped 04/22/17 1804)  piperacillin-tazobactam (ZOSYN) IVPB 3.375 g (0 g Intravenous Stopped 04/22/17 1616)  lactated ringers bolus 1,000 mL (0 mLs Intravenous Stopped  04/22/17 1739)  Tdap (BOOSTRIX) injection 0.5 mL (0.5 mLs Intramuscular Given 04/22/17 1838)     NEW OUTPATIENT MEDICATIONS STARTED DURING THIS VISIT:  New Prescriptions   No medications on file    Note:  This note was prepared with assistance of Dragon voice recognition software. Occasional wrong-word or sound-a-like substitutions may have occurred due to the inherent limitations of voice recognition software.   Merrily Pew, MD 04/22/17 1910

## 2017-04-22 NOTE — Progress Notes (Signed)
Patient BP 173/850 .  Notified the MD.  Will continue to monitor the patient.

## 2017-04-22 NOTE — ED Notes (Signed)
Pt given lunch tray.

## 2017-04-22 NOTE — H&P (Addendum)
History and Physical    [name] YKD:[id number] DOB: [birth year] DOA: 04/22/2017  PCP: Alycia Rossetti, MD  Patient coming from: home  I have personally briefly reviewed patient's old medical records in [place]  Chief Complaint: Right hand swelling  HPI: [name] is a 83 y.o. male with medical history significant of dementia, seizures, hypertension, presents to the hospital with complaints of right hand swelling.  He reports that approximately 5 days ago he had noticed a small blister on dorsal aspect of fourth digit of right hand.  His wife took a needle, he did it and then dipped it in alcohol.  She tried to rupture the blister.  The following day, he noticed worsening swelling and redness in his hand.  This progressively got worse and started to track up his arm.  Today, when he was putting on his pants, he noticed that there was a large area of drainage on the dorsal aspect of his right hand.  This was draining purulent fluid.  He also reports feeling feverish over the last several days.  He denies any significant trauma to this area.  Other than his hand, he does not have any chest pain, shortness of breath, cough, vomiting, diarrhea or any other complaints.  ED Course: He was evaluated in the emergency room where vitals were noted to be stable.  He did not have any leukocytosis.  It was noted that purulent fluid was draining from his right hand.  X-rays were performed of his hand which should not show any significant findings.  He is been referred for antibiotic therapy.  Review of Systems: As per HPI otherwise 10 point review of systems negative.    Past Medical History:  Diagnosis Date  . Abnormality of gait 08/01/2013  . Arthritis   . Blindness of left eye    Posttraumatic  . Carotid artery disease ([place])   . Colon polyp   . Constipation   . Diaphragmatic hernia   . Disc disease, degenerative, cervical   . Diverticulitis   . ED (erectile dysfunction)   .  Gastroparesis   . GERD (gastroesophageal reflux disease)   . Glaucoma   . Heart disease   . Hyperlipidemia   . Hypertension   . Insomnia   . Memory difficulty 08/04/2014  . Nicotine dependence   . Numbness and tingling of right arm 08/04/2014  . Osteoarthritis   . Overactive bladder   . Radiculopathy   . Shortness of breath    with activity  . Sleep apnea    does not use CPAP.  Tested maybe 5- 10 years ago  . Stroke [facility])    Left side weakness.  . Unilateral inguinal hernia     Past Surgical History:  Procedure Laterality Date  . ANTERIOR CERVICAL DECOMP/DISCECTOMY FUSION N/A 07/01/2012   Procedure: ANTERIOR CERVICAL DECOMPRESSION/DISCECTOMY FUSION CERVICAL FIVE-SIX [name] REMOVAL CERVICAL SIX-SEVEN;  Surgeon: [name], MD;  Location: [place] NEURO ORS;  Service: Neurosurgery;  Laterality: N/A;  . CERVICAL FUSION    . COLONOSCOPY   08/04/2002     RMR: Normal rectum/Pancolonic diverticula/Colonic polyps as described above, biopsied and/or snared  . COLONOSCOPY  60/08/2009   RMR: normal rectum/left and right sided diverticula/multiple colonic polyps. tubular adenomas. surveillance due 2014  . COLONOSCOPY WITH ESOPHAGOGASTRODUODENOSCOPY (EGD) N/A 12/18/2012   Dr. [name]:  Colonic diverticulosis. Multiple colonic polyps-hyperplastic polyps and tubular adenoma. Friable anal canal hemorrhoids. EGD with chronic atrophic gastritis  . ESOPHAGOGASTRODUODENOSCOPY  04/14/2002   LZJ:[id number]'T'  ring, status post dilation as described above/Hiatal hernia, focal antral erosions of uncertain clinical significance  . ESOPHAGOGASTRODUODENOSCOPY  06/09/2009   RMR: normal esophagus s/p dilator/small hiatal hernia otherwise normal  . HERNIA REPAIR Right    inguinal  . INGUINAL HERNIA REPAIR Left 04/15/2014   Procedure: LEFT INGUINAL HERNIORRHAPHY WITH MESH;  Surgeon: [name], MD;  Location: [facility];  Service: General;  Laterality: Left;  . INSERTION OF MESH Left 04/15/2014   Procedure:  INSERTION OF MESH;  Surgeon: [name], MD;  Location: [facility];  Service: General;  Laterality: Left;     reports that he has quit smoking. His smoking use included cigarettes. He has a 30.00 pack-year smoking history. He has never used smokeless tobacco. He reports that he does not drink alcohol or use drugs.  Allergies  Allergen Reactions  . Aspirin Nausea And Vomiting    Tolerates 81mg  daily    Family History  Problem Relation Age of Onset  . Cancer Mother   . Dementia Father   . Colon cancer Neg Hx     Prior to Admission medications   Medication Sig Start Date End Date Taking? Authorizing Provider  aspirin EC 81 MG tablet Take 162 mg by mouth at bedtime.    Yes [provider]  Cholecalciferol (VITAMIN D-3) 1000 units CAPS TAKE 1 CAPSULE BY MOUTH ONCE DAILY. 03/19/17  Yes [place], Modena Nunnery, MD  citalopram (CELEXA) 20 MG tablet TAKE 1 TABLET BY MOUTH ONCE A DAY FOR DEPRESSION. 02/22/17  Yes [place], Modena Nunnery, MD  DAILY MULTIPLE VITAMINS tablet TAKE 1 TABLET BY MOUTH ONCE DAILY. 03/19/17  Yes [place], Modena Nunnery, MD  donepezil (ARICEPT) 10 MG tablet TAKE (1) TABLET BY MOUTH AT BEDTIME. 05/20/15  Yes Kathrynn Ducking, MD  fluticasone (FLONASE) 50 MCG/ACT nasal spray USE 1 OR 2 SPRAYS IN EACH NOSTRIL EVERY DAY FOR NASAL ALLERGIES. 02/22/17  Yes [place], Modena Nunnery, MD  HYDROcodone-acetaminophen (NORCO/VICODIN) 5-325 MG tablet TAKE 1 TABLET BY MOUTH THREE TIMES DAILY AS NEEDED FOR MODERATE PAIN 04/17/17  Yes [place], Modena Nunnery, MD  levETIRAcetam (KEPPRA) 250 MG tablet TAKE (1) TABLET BY MOUTH EACH MORNING. 04/13/17  Yes [place], Modena Nunnery, MD  lisinopril (PRINIVIL,ZESTRIL) 10 MG tablet Take 10 mg by mouth daily.   Yes [provider]  LORazepam (ATIVAN) 1 MG tablet TAKE (1) TABLET BY MOUTH AT BEDTIME. 11/21/16  Yes [place], Modena Nunnery, MD  lubiprostone (AMITIZA) 24 MCG capsule Take 1 capsule (24 mcg total) by mouth daily with breakfast. MAY TAKE ADDITIONAL DOSE QD PRN. 01/04/17   Yes [place], Modena Nunnery, MD  mirtazapine (REMERON) 15 MG tablet TAKE 1/2 TABLET(7.5MG ) BY MOUTH AT BEDTIME FOR APPETITE. 04/13/17  Yes [place], Modena Nunnery, MD  Multiple Vitamin (MULTIVITAMIN WITH MINERALS) TABS tablet Take 1 tablet by mouth daily. 02/15/17  Yes [place], Modena Nunnery, MD  naproxen sodium (ALEVE) 220 MG tablet Take 440 mg by mouth daily as needed.   Yes [provider]  omeprazole (PRILOSEC) 20 MG capsule TAKE 1 CAPSULE BY MOUTH ONCE DAILY FOR RELFUX ESOPHAGITIS OR STOMACH ULCERS. 04/13/17  Yes [place], Modena Nunnery, MD  traZODone (DESYREL) 100 MG tablet TAKE (1) TABLET BY MOUTH AT BEDTIME. 03/19/17  Yes [place], Modena Nunnery, MD  VESICARE 5 MG tablet TAKE ONE TABLET ONCE DAILY FOR BLADDER. 04/13/17  Yes [place], Modena Nunnery, MD  albuterol (PROVENTIL HFA;VENTOLIN HFA) 108 (90 Base) MCG/ACT inhaler Inhale 2 puffs into the lungs every 4 (four) hours as needed for  wheezing or shortness of breath. 02/27/17   Alycia Rossetti, MD  amoxicillin-clavulanate (AUGMENTIN) 875-125 MG tablet Take 1 tablet by mouth 2 (two) times daily. Patient not taking: Reported on 03/26/2017 03/06/17   Kathie Dike, MD  nitroGLYCERIN (NITROSTAT) 0.4 MG SL tablet Place 0.4 mg under the tongue every 5 (five) minutes as needed for chest pain.    [provider]    Physical Exam: Vitals:   04/22/17 1421 04/22/17 1422 04/22/17 1436  BP:  (!) 144/68 (!) 154/68  Pulse:  85 76  Resp:  20 18  Temp:  98.7 F (37.1 C) 97.9 F (36.6 C)  TempSrc:  Temporal Oral  SpO2:  97% 94%  Weight: 74.8 kg (165 lb)    Height: 5\' 10"  (1.778 m)      Constitutional: NAD, calm, comfortable Vitals:   04/22/17 1421 04/22/17 1422 04/22/17 1436  BP:  (!) 144/68 (!) 154/68  Pulse:  85 76  Resp:  20 18  Temp:  98.7 F (37.1 C) 97.9 F (36.6 C)  TempSrc:  Temporal Oral  SpO2:  97% 94%  Weight: 74.8 kg (165 lb)    Height: 5\' 10"  (1.778 m)     Eyes: PERRL, lids and conjunctivae normal ENMT: Mucous membranes are moist. Posterior  pharynx clear of any exudate or lesions.Normal dentition.  Neck: normal, supple, no masses, no thyromegaly Respiratory: clear to auscultation bilaterally, no wheezing, no crackles. Normal respiratory effort. No accessory muscle use.  Cardiovascular: Regular rate and rhythm, no murmurs / rubs / gallops. No extremity edema. 2+ pedal pulses. No carotid bruits.  Abdomen: no tenderness, no masses palpated. No hepatosplenomegaly. Bowel sounds positive.  Musculoskeletal: no clubbing / cyanosis. No joint deformity upper and lower extremities. Good ROM, no contractures. Normal muscle tone.  Skin: See images below Neurologic: CN 2-12 grossly intact. Sensation intact, DTR normal. Strength 5/5 in all 4.  Psychiatric: Normal judgment and insight. Alert and oriented x 3. Normal mood.         Labs on Admission: I have personally reviewed following labs and imaging studies  CBC: Recent Labs  Lab 04/22/17 1449  WBC 9.7  NEUTROABS 5.8  HGB 12.2*  HCT 37.2*  MCV 93.7  PLT 161   Basic Metabolic Panel: Recent Labs  Lab 04/22/17 1449  NA 139  K 3.4*  CL 104  CO2 23  GLUCOSE 124*  BUN 20  CREATININE 1.17  CALCIUM 8.9   GFR: Estimated Creatinine Clearance: 52 mL/min (by C-G formula based on SCr of 1.17 mg/dL). Liver Function Tests: Recent Labs  Lab 04/22/17 1449  AST 22  ALT 12*  ALKPHOS 87  BILITOT 0.8  PROT 7.6  ALBUMIN 3.2*   No results for input(s): LIPASE, AMYLASE in the last 168 hours. No results for input(s): AMMONIA in the last 168 hours. Coagulation Profile: No results for input(s): INR, PROTIME in the last 168 hours. Cardiac Enzymes: No results for input(s): CKTOTAL, CKMB, CKMBINDEX, TROPONINI in the last 168 hours. BNP (last 3 results) No results for input(s): PROBNP in the last 8760 hours. HbA1C: No results for input(s): HGBA1C in the last 72 hours. CBG: No results for input(s): GLUCAP in the last 168 hours. Lipid Profile: No results for input(s): CHOL, HDL,  LDLCALC, TRIG, CHOLHDL, LDLDIRECT in the last 72 hours. Thyroid Function Tests: No results for input(s): TSH, T4TOTAL, FREET4, T3FREE, THYROIDAB in the last 72 hours. Anemia Panel: No results for input(s): VITAMINB12, FOLATE, FERRITIN, TIBC, IRON, RETICCTPCT in the last 72  hours. Urine analysis:    Component Value Date/Time   COLORURINE YELLOW 03/03/2017 1827   APPEARANCEUR CLEAR 03/03/2017 1827   LABSPEC 1.012 03/03/2017 1827   PHURINE 7.0 03/03/2017 1827   GLUCOSEU NEGATIVE 03/03/2017 1827   HGBUR NEGATIVE 03/03/2017 1827   BILIRUBINUR NEGATIVE 03/03/2017 1827   KETONESUR NEGATIVE 03/03/2017 1827   PROTEINUR NEGATIVE 03/03/2017 1827   UROBILINOGEN 0.2 04/26/2014 0930   NITRITE NEGATIVE 03/03/2017 1827   LEUKOCYTESUR NEGATIVE 03/03/2017 1827    Radiological Exams on Admission: Dg Hand Complete Right  Result Date: 04/22/2017 CLINICAL DATA:  Large draining right hand abscess at the 4th MCP joint. EXAM: RIGHT HAND - COMPLETE 3+ VIEW COMPARISON:  None. FINDINGS: Dorsal soft tissue swelling, most pronounced at the level of the MCP joints. No soft tissue gas, bone destruction or periosteal reaction. No radiopaque foreign body. IMPRESSION: Dorsal soft tissue swelling without underlying bony abnormality. Electronically Signed   By: Claudie Revering M.D.   On: 04/22/2017 15:18   Assessment/Plan Active Problems:   Essential hypertension   GERD   Dementia   Seizure (HCC)   Cellulitis of hand, right    1. Cellulitis of right hand.  No bony changes noted on x-ray.  Start on intravenous antibiotics of vancomycin.  Keep right hand elevated.  Extent of erythema has been demarcated.  Continue to monitor for improvement.  Case discussed with Dr. Alain Marion, on-call for hand surgery who recommended transfer to [facility] for continued observation since he may need further incision and drainage with washout if he does not improve.  We will keep patient n.p.o. after midnight in case any  procedures planned for morning.  Will also provide tetanus booster. 2. Seizure disorder.  Continue on Keppra 3. Dementia.  Stable.  Continue Aricept 4. GERD.  Continue on PPI 5. Hypertension.  Blood pressure stable.  Continue on lisinopril  DVT prophylaxis: Lovenox Code Status: DNR Family Communication: Discussed with wife and daughter at the bedside Disposition Plan: Discharge home once infection has improved Consults called:  Admission status: Inpatient, MedSurg   Kathie Dike MD [facility] Pager [phone]  If 7PM-7AM, please contact night-coverage [email or web address] Password [facility]  04/22/2017, 5:28 PM

## 2017-04-22 NOTE — ED Triage Notes (Signed)
Pt reports he had a blister on his right hand last Tuesday and had his wife stick a hot needle in it.  Now has large draining abscess to right 4th knuckle.

## 2017-04-22 NOTE — Progress Notes (Signed)
Pharmacy Note:  Initial antibiotic(s) regimen of Vancomycin ordered by EDP to treat sepsis.  Estimated Creatinine Clearance: 52 mL/min (by C-G formula based on SCr of 1.17 mg/dL).   Allergies  Allergen Reactions  . Aspirin Nausea And Vomiting    Tolerates 81mg  daily    Vitals:   04/22/17 1530 04/22/17 1730  BP: (!) 148/64 (!) 148/62  Pulse: 74 71  Resp: 17 15  Temp:    SpO2: 97% 97%    Anti-infectives (From admission, onward)   Start     Dose/Rate Route Frequency Ordered Stop   04/22/17 1515  vancomycin (VANCOCIN) 1,500 mg in sodium chloride 0.9 % 500 mL IVPB     1,500 mg 250 mL/hr over 120 Minutes Intravenous  Once 04/22/17 1449 04/22/17 1742   04/22/17 1500  piperacillin-tazobactam (ZOSYN) IVPB 3.375 g     3.375 g 100 mL/hr over 30 Minutes Intravenous  Once 04/22/17 1449 04/22/17 1616     Plan: Initial dose(s) of Vancomycin 1500mg  X 1 ordered. F/U admission orders for further dosing if therapy continued.  Ena Dawley, [facility] 04/22/2017 5:57 PM

## 2017-04-22 NOTE — ED Notes (Addendum)
Report given to Barbaraann Boys, RN

## 2017-04-22 NOTE — ED Notes (Signed)
Spoke with Dr Roderic Palau , he spoke to hand sx and wants transferred to [facility]

## 2017-04-23 ENCOUNTER — Encounter (HOSPITAL_COMMUNITY): Payer: Self-pay | Admitting: Orthopedic Surgery

## 2017-04-23 ENCOUNTER — Inpatient Hospital Stay (HOSPITAL_COMMUNITY): Payer: Medicare Other | Admitting: Certified Registered"

## 2017-04-23 ENCOUNTER — Encounter (HOSPITAL_COMMUNITY)
Admission: EM | Disposition: A | Discharge status: Home / Self Care | Payer: Self-pay | Source: Home / Self Care | Attending: Family Medicine

## 2017-04-23 DIAGNOSIS — R32 Unspecified urinary incontinence: Secondary | ICD-10-CM

## 2017-04-23 DIAGNOSIS — F321 Major depressive disorder, single episode, moderate: Secondary | ICD-10-CM

## 2017-04-23 DIAGNOSIS — F0390 Unspecified dementia without behavioral disturbance: Secondary | ICD-10-CM

## 2017-04-23 DIAGNOSIS — F5104 Psychophysiologic insomnia: Secondary | ICD-10-CM

## 2017-04-23 HISTORY — PX: I&D EXTREMITY: SHX5045

## 2017-04-23 LAB — CBC
HCT: 32.5 % — ABNORMAL LOW (ref 39.0–52.0)
Hemoglobin: 10.6 g/dL — ABNORMAL LOW (ref 13.0–17.0)
MCH: 30.2 pg (ref 26.0–34.0)
MCHC: 32.6 g/dL (ref 30.0–36.0)
MCV: 92.6 fL (ref 78.0–100.0)
PLATELETS: 167 10*3/uL (ref 150–400)
RBC: 3.51 MIL/uL — AB (ref 4.22–5.81)
RDW: 14.7 % (ref 11.5–15.5)
WBC: 6.8 10*3/uL (ref 4.0–10.5)

## 2017-04-23 LAB — HEMOGLOBIN A1C
Hgb A1c MFr Bld: 5.4 % (ref 4.8–5.6)
Mean Plasma Glucose: 108.28 mg/dL

## 2017-04-23 LAB — CREATININE, SERUM: CREATININE: 1.09 mg/dL (ref 0.61–1.24)

## 2017-04-23 LAB — BASIC METABOLIC PANEL
ANION GAP: 7 (ref 5–15)
BUN: 13 mg/dL (ref 6–20)
CO2: 24 mmol/L (ref 22–32)
CREATININE: 0.99 mg/dL (ref 0.61–1.24)
Calcium: 8.1 mg/dL — ABNORMAL LOW (ref 8.9–10.3)
Chloride: 108 mmol/L (ref 101–111)
GFR calc non Af Amer: >60 mL/min (ref >60)
Glucose, Bld: 99 mg/dL (ref 65–99)
POTASSIUM: 3.3 mmol/L — AB (ref 3.5–5.1)
SODIUM: 139 mmol/L (ref 135–145)

## 2017-04-23 SURGERY — IRRIGATION AND DEBRIDEMENT EXTREMITY
Anesthesia: General | Site: Hand | Laterality: Right

## 2017-04-23 MED ORDER — DEXAMETHASONE SODIUM PHOSPHATE 10 MG/ML IJ SOLN
INTRAMUSCULAR | Status: AC
  Filled 2017-04-23: qty 1

## 2017-04-23 MED ORDER — METOCLOPRAMIDE HCL 5 MG PO TABS
5.0000 mg | ORAL_TABLET | Freq: Three times a day (TID) | ORAL | Status: DC | PRN
  Filled 2017-04-23: qty 2

## 2017-04-23 MED ORDER — EPHEDRINE SULFATE-NACL 50-0.9 MG/10ML-% IV SOSY
PREFILLED_SYRINGE | INTRAVENOUS | Status: DC | PRN
  Administered 2017-04-23: 5 mg via INTRAVENOUS

## 2017-04-23 MED ORDER — LACTATED RINGERS IV SOLN
INTRAVENOUS | Status: DC
  Administered 2017-04-23: 13:00:00 via INTRAVENOUS

## 2017-04-23 MED ORDER — CEFAZOLIN SODIUM 1 G IJ SOLR
INTRAMUSCULAR | Status: AC
  Filled 2017-04-23: qty 20

## 2017-04-23 MED ORDER — HYDROCODONE-ACETAMINOPHEN 5-325 MG PO TABS
1.0000 | ORAL_TABLET | Freq: Four times a day (QID) | ORAL | Status: DC | PRN
  Administered 2017-04-23: 1 via ORAL
  Administered 2017-04-24 – 2017-04-25 (×2): 2 via ORAL
  Filled 2017-04-23: qty 2
  Filled 2017-04-23 (×2): qty 1
  Filled 2017-04-23: qty 2

## 2017-04-23 MED ORDER — PROPOFOL 10 MG/ML IV BOLUS
INTRAVENOUS | Status: AC
  Filled 2017-04-23: qty 20

## 2017-04-23 MED ORDER — FENTANYL CITRATE (PF) 250 MCG/5ML IJ SOLN
INTRAMUSCULAR | Status: DC | PRN
  Administered 2017-04-23 (×5): 25 ug via INTRAVENOUS

## 2017-04-23 MED ORDER — PHENYLEPHRINE 40 MCG/ML (10ML) SYRINGE FOR IV PUSH (FOR BLOOD PRESSURE SUPPORT)
PREFILLED_SYRINGE | INTRAVENOUS | Status: AC
  Filled 2017-04-23: qty 10

## 2017-04-23 MED ORDER — LACTATED RINGERS IV SOLN
INTRAVENOUS | Status: DC
Start: 2017-04-23 — End: 2017-04-25
  Administered 2017-04-23: 17:00:00 via INTRAVENOUS

## 2017-04-23 MED ORDER — CEFAZOLIN SODIUM-DEXTROSE 2-3 GM-%(50ML) IV SOLR
INTRAVENOUS | Status: DC | PRN
  Administered 2017-04-23: 2 g via INTRAVENOUS

## 2017-04-23 MED ORDER — 0.9 % SODIUM CHLORIDE (POUR BTL) OPTIME
TOPICAL | Status: DC | PRN
  Administered 2017-04-23: 1000 mL

## 2017-04-23 MED ORDER — BUPIVACAINE HCL (PF) 0.25 % IJ SOLN
INTRAMUSCULAR | Status: AC
  Filled 2017-04-23: qty 30

## 2017-04-23 MED ORDER — ONDANSETRON HCL 4 MG/2ML IJ SOLN
INTRAMUSCULAR | Status: AC
  Filled 2017-04-23: qty 2

## 2017-04-23 MED ORDER — DOCUSATE SODIUM 100 MG PO CAPS
100.0000 mg | ORAL_CAPSULE | Freq: Two times a day (BID) | ORAL | Status: DC
  Administered 2017-04-23 – 2017-04-24 (×3): 100 mg via ORAL
  Filled 2017-04-23 (×4): qty 1

## 2017-04-23 MED ORDER — PROPOFOL 10 MG/ML IV BOLUS
INTRAVENOUS | Status: DC | PRN
  Administered 2017-04-23: 140 mg via INTRAVENOUS

## 2017-04-23 MED ORDER — DEXAMETHASONE SODIUM PHOSPHATE 10 MG/ML IJ SOLN
INTRAMUSCULAR | Status: DC | PRN
  Administered 2017-04-23: 5 mg via INTRAVENOUS

## 2017-04-23 MED ORDER — PHENYLEPHRINE 40 MCG/ML (10ML) SYRINGE FOR IV PUSH (FOR BLOOD PRESSURE SUPPORT)
PREFILLED_SYRINGE | INTRAVENOUS | Status: DC | PRN
  Administered 2017-04-23: 80 ug via INTRAVENOUS

## 2017-04-23 MED ORDER — ONDANSETRON HCL 4 MG/2ML IJ SOLN: 4.0000 mg | Freq: Four times a day (QID) | INTRAMUSCULAR | Status: DC | PRN

## 2017-04-23 MED ORDER — ONDANSETRON HCL 4 MG/2ML IJ SOLN
INTRAMUSCULAR | Status: DC | PRN
  Administered 2017-04-23: 4 mg via INTRAVENOUS

## 2017-04-23 MED ORDER — LIDOCAINE 2% (20 MG/ML) 5 ML SYRINGE
INTRAMUSCULAR | Status: DC | PRN
  Administered 2017-04-23: 40 mg via INTRAVENOUS

## 2017-04-23 MED ORDER — FENTANYL CITRATE (PF) 250 MCG/5ML IJ SOLN
INTRAMUSCULAR | Status: AC
  Filled 2017-04-23: qty 5

## 2017-04-23 MED ORDER — FENTANYL CITRATE (PF) 100 MCG/2ML IJ SOLN: 25.0000 ug | INTRAMUSCULAR | Status: DC | PRN

## 2017-04-23 MED ORDER — METOCLOPRAMIDE HCL 5 MG/ML IJ SOLN: 5.0000 mg | Freq: Three times a day (TID) | INTRAMUSCULAR | Status: DC | PRN

## 2017-04-23 MED ORDER — PNEUMOCOCCAL VAC POLYVALENT 25 MCG/0.5ML IJ INJ: 0.5000 mL | INJECTION | INTRAMUSCULAR | Status: DC | PRN

## 2017-04-23 MED ORDER — ONDANSETRON HCL 4 MG PO TABS: 4.0000 mg | ORAL_TABLET | Freq: Four times a day (QID) | ORAL | Status: DC | PRN

## 2017-04-23 MED ORDER — SODIUM CHLORIDE 0.9 % IR SOLN
Status: DC | PRN
  Administered 2017-04-23: 3000 mL

## 2017-04-23 SURGICAL SUPPLY — 41 items
BANDAGE ACE 4X5 VEL STRL LF (GAUZE/BANDAGES/DRESSINGS) ×3 IMPLANT
BANDAGE ACE 6X5 VEL STRL LF (GAUZE/BANDAGES/DRESSINGS) ×3 IMPLANT
BLADE SURG 10 STRL SS (BLADE) ×3 IMPLANT
BNDG COHESIVE 4X5 TAN STRL (GAUZE/BANDAGES/DRESSINGS) ×3 IMPLANT
BNDG ESMARK 4X9 LF (GAUZE/BANDAGES/DRESSINGS) ×2 IMPLANT
BNDG GAUZE ELAST 4 BULKY (GAUZE/BANDAGES/DRESSINGS) ×3 IMPLANT
CONT SPEC 4OZ CLIKSEAL STRL BL (MISCELLANEOUS) ×2 IMPLANT
COVER SURGICAL LIGHT HANDLE (MISCELLANEOUS) ×3 IMPLANT
CUFF TOURNIQUET SINGLE 18IN (TOURNIQUET CUFF) ×2 IMPLANT
DRAPE SURG 17X23 STRL (DRAPES) ×2 IMPLANT
DRAPE U-SHAPE 47X51 STRL (DRAPES) IMPLANT
DRSG EMULSION OIL 3X3 NADH (GAUZE/BANDAGES/DRESSINGS) ×2 IMPLANT
DRSG PAD ABDOMINAL 8X10 ST (GAUZE/BANDAGES/DRESSINGS) ×3 IMPLANT
ELECT REM PT RETURN 9FT ADLT (ELECTROSURGICAL) ×3
ELECTRODE REM PT RTRN 9FT ADLT (ELECTROSURGICAL) IMPLANT
GAUZE PACKING IODOFORM 1/4X15 (GAUZE/BANDAGES/DRESSINGS) ×2 IMPLANT
GAUZE SPONGE 4X4 12PLY STRL (GAUZE/BANDAGES/DRESSINGS) ×3 IMPLANT
GAUZE XEROFORM 1X8 LF (GAUZE/BANDAGES/DRESSINGS) ×3 IMPLANT
GLOVE BIO SURGEON STRL SZ7.5 (GLOVE) ×6 IMPLANT
GLOVE BIOGEL PI IND STRL 8 (GLOVE) ×2 IMPLANT
GLOVE BIOGEL PI INDICATOR 8 (GLOVE) ×4
GLOVE SURG SS PI 6.5 STRL IVOR (GLOVE) ×2 IMPLANT
GOWN STRL REUS W/ TWL LRG LVL3 (GOWN DISPOSABLE) ×3 IMPLANT
GOWN STRL REUS W/TWL LRG LVL3 (GOWN DISPOSABLE) ×6
KIT BASIN OR (CUSTOM PROCEDURE TRAY) ×3 IMPLANT
KIT TURNOVER KIT B (KITS) ×3 IMPLANT
MANIFOLD NEPTUNE II (INSTRUMENTS) ×3 IMPLANT
NEEDLE 25GAX1.5 (MISCELLANEOUS) IMPLANT
NS IRRIG 1000ML POUR BTL (IV SOLUTION) ×3 IMPLANT
PACK ORTHO EXTREMITY (CUSTOM PROCEDURE TRAY) ×3 IMPLANT
PAD ARMBOARD 7.5X6 YLW CONV (MISCELLANEOUS) ×4 IMPLANT
SCRUB POVIDONE IODINE 4 OZ (MISCELLANEOUS) ×2 IMPLANT
SET CYSTO W/LG BORE CLAMP LF (SET/KITS/TRAYS/PACK) ×2 IMPLANT
STOCKINETTE IMPERVIOUS 9X36 MD (GAUZE/BANDAGES/DRESSINGS) ×3 IMPLANT
SUT ETHILON 3 0 PS 1 (SUTURE) ×2 IMPLANT
SYR CONTROL 10ML LL (SYRINGE) ×2 IMPLANT
TOWEL OR 17X26 10 PK STRL BLUE (TOWEL DISPOSABLE) ×3 IMPLANT
TUBE CONNECTING 12'X1/4 (SUCTIONS) ×1
TUBE CONNECTING 12X1/4 (SUCTIONS) ×2 IMPLANT
UNDERPAD 30X30 (UNDERPADS AND DIAPERS) ×3 IMPLANT
YANKAUER SUCT BULB TIP NO VENT (SUCTIONS) ×3 IMPLANT

## 2017-04-23 NOTE — Transfer of Care (Signed)
Immediate Anesthesia Transfer of Care Note  Patient: [name]  Procedure(s) Performed: IRRIGATION AND DEBRIDEMENT HAND (Right Hand)  Patient Location: PACU  Anesthesia Type:General  Level of Consciousness: awake, alert  and oriented  Airway & Oxygen Therapy: Patient Spontanous Breathing and Patient connected to face mask oxygen  Post-op Assessment: Report given to RN, Post -op Vital signs reviewed and stable and Patient moving all extremities X 4  Post vital signs: Reviewed and stable  Last Vitals:  Vitals Value Taken Time  BP 156/80 04/23/2017  3:16 PM  Temp    Pulse 71 04/23/2017  3:17 PM  Resp 25 04/23/2017  3:17 PM  SpO2 100 % 04/23/2017  3:17 PM  Vitals shown include unvalidated device data.  Last Pain:  Vitals:   04/23/17 1049  TempSrc:   PainSc: 8       Patients Stated Pain Goal: 5 (44/31/54 0086)  Complications: No apparent anesthesia complications

## 2017-04-23 NOTE — Anesthesia Preprocedure Evaluation (Addendum)
Anesthesia Evaluation  Patient identified by MRN, date of birth, ID band Patient awake    Reviewed: Allergy & Precautions, NPO status , Patient's Chart, lab work & pertinent test results  Airway Mallampati: II  TM Distance: >3 FB Neck ROM: Full    Dental  (+) Dental Advisory Given   Pulmonary sleep apnea , former smoker,    breath sounds clear to auscultation       Cardiovascular hypertension, Pt. on medications + Peripheral Vascular Disease   Rhythm:Regular Rate:Normal  Normal LV systolic function; mild proximal septal thickening;  mild diastolic dysfunction; mild MR and TR.   Neuro/Psych Seizures -,  Depression Dementia  Neuromuscular disease CVA    GI/Hepatic Neg liver ROS, GERD  ,  Endo/Other  negative endocrine ROS  Renal/GU negative Renal ROS     Musculoskeletal  (+) Arthritis ,   Abdominal   Peds  Hematology  (+) anemia ,   Anesthesia Other Findings   Reproductive/Obstetrics                            Lab Results  Component Value Date   WBC 6.8 04/23/2017   HGB 10.6 (L) 04/23/2017   HCT 32.5 (L) 04/23/2017   MCV 92.6 04/23/2017   PLT 167 04/23/2017   Lab Results  Component Value Date   CREATININE 0.99 04/23/2017   BUN 13 04/23/2017   NA 139 04/23/2017   K 3.3 (L) 04/23/2017   CL 108 04/23/2017   CO2 24 04/23/2017    Anesthesia Physical Anesthesia Plan  ASA: III  Anesthesia Plan: General   Post-op Pain Management:    Induction: Intravenous  PONV Risk Score and Plan: 2 and Dexamethasone, Ondansetron and Treatment may vary due to age or medical condition  Airway Management Planned: LMA  Additional Equipment:   Intra-op Plan:   Post-operative Plan: Extubation in OR  Informed Consent: I have reviewed the patients History and Physical, chart, labs and discussed the procedure including the risks, benefits and alternatives for the proposed anesthesia with the  patient or authorized representative who has indicated his/her understanding and acceptance.     Plan Discussed with: CRNA  Anesthesia Plan Comments:         Anesthesia Quick Evaluation

## 2017-04-23 NOTE — Interval H&P Note (Signed)
History and Physical Interval Note:  04/23/2017 1:44 PM  [name]  has presented today for surgery, with the diagnosis of infection  The various methods of treatment have been discussed with the patient and family. After consideration of risks, benefits and other options for treatment, the patient has consented to  Procedure(s): IRRIGATION AND DEBRIDEMENT HAND (Right) as a surgical intervention .  The patient's history has been reviewed, patient examined, no change in status, stable for surgery.  I have reviewed the patient's chart and labs.  Questions were answered to the patient's satisfaction.     [name] D

## 2017-04-23 NOTE — H&P (View-Only) (Signed)
ORTHOPAEDIC CONSULTATION  REQUESTING PHYSICIAN: Mariel Aloe, MD  Chief Complaint: Right hand pain/wound  HPI: [name] is a 83 y.o. male who complains of right hand pain and wound starting about 6 days ago as a "water blister" that he had his wife popped.  He denies known injury or bite.  He has had some increasing pain, swelling, and drainage and presented to the emergency department.  He was placed on vancomycin Zosyn and orthopedics was consulted for evaluation.  He has remained afebrile.  WBC is WNL.  There are no bony changes on x-ray.  Today his pain is controlled.  He denies fevers or systemic symptoms..  Last meal was yesterday.  He denies history of diabetes.  He does smoke occasionally.   Past Medical History:  Diagnosis Date  . Abnormality of gait 08/01/2013  . Arthritis   . Blindness of left eye    Posttraumatic  . Carotid artery disease ([place])   . Colon polyp   . Constipation   . Diaphragmatic hernia   . Disc disease, degenerative, cervical   . Diverticulitis   . ED (erectile dysfunction)   . Gastroparesis   . GERD (gastroesophageal reflux disease)   . Glaucoma   . Heart disease   . Hyperlipidemia   . Hypertension   . Insomnia   . Memory difficulty 08/04/2014  . Nicotine dependence   . Numbness and tingling of right arm 08/04/2014  . Osteoarthritis   . Overactive bladder   . Radiculopathy   . Shortness of breath    with activity  . Sleep apnea    does not use CPAP.  Tested maybe 5- 10 years ago  . Stroke [facility])    Left side weakness.  . Unilateral inguinal hernia    Past Surgical History:  Procedure Laterality Date  . ANTERIOR CERVICAL DECOMP/DISCECTOMY FUSION N/A 07/01/2012   Procedure: ANTERIOR CERVICAL DECOMPRESSION/DISCECTOMY FUSION CERVICAL FIVE-SIX [name] REMOVAL CERVICAL SIX-SEVEN;  Surgeon: [name], MD;  Location: [facility];  Service: Neurosurgery;  Laterality: N/A;  . CERVICAL FUSION    . COLONOSCOPY   08/04/2002     RMR:  Normal rectum/Pancolonic diverticula/Colonic polyps as described above, biopsied and/or snared  . COLONOSCOPY  60/08/2009   RMR: normal rectum/left and right sided diverticula/multiple colonic polyps. tubular adenomas. surveillance due 2014  . COLONOSCOPY WITH ESOPHAGOGASTRODUODENOSCOPY (EGD) N/A 12/18/2012   Dr. [name]:  Colonic diverticulosis. Multiple colonic polyps-hyperplastic polyps and tubular adenoma. Friable anal canal hemorrhoids. EGD with chronic atrophic gastritis  . ESOPHAGOGASTRODUODENOSCOPY  04/14/2002   VEH:[id number]' ring, status post dilation as described above/Hiatal hernia, focal antral erosions of uncertain clinical significance  . ESOPHAGOGASTRODUODENOSCOPY  06/09/2009   RMR: normal esophagus s/p dilator/small hiatal hernia otherwise normal  . HERNIA REPAIR Right    inguinal  . INGUINAL HERNIA REPAIR Left 04/15/2014   Procedure: LEFT INGUINAL HERNIORRHAPHY WITH MESH;  Surgeon: [name], MD;  Location: [facility];  Service: General;  Laterality: Left;  . INSERTION OF MESH Left 04/15/2014   Procedure: INSERTION OF MESH;  Surgeon: [name], MD;  Location: [facility];  Service: General;  Laterality: Left;   Social History   Socioeconomic History  . Marital status: Married    Spouse name: Not on file  . Number of children: 2  . Years of education: hs  . Highest education level: Not on file  Occupational History  . Occupation: Retired    Fish farm manager: RETIRED  Social Needs  . Financial  resource strain: Not on file  . Food insecurity:    Worry: Not on file    Inability: Not on file  . Transportation needs:    Medical: Not on file    Non-medical: Not on file  Tobacco Use  . Smoking status: Former Smoker    Packs/day: 0.50    Years: 60.00    Pack years: 30.00    Types: Cigarettes  . Smokeless tobacco: Never Used  Substance and Sexual Activity  . Alcohol use: No  . Drug use: No  . Sexual activity: Not on file  Lifestyle  . Physical activity:    Days per  week: Not on file    Minutes per session: Not on file  . Stress: Not on file  Relationships  . Social connections:    Talks on phone: Not on file    Gets together: Not on file    Attends religious service: Not on file    Active member of club or organization: Not on file    Attends meetings of clubs or organizations: Not on file    Relationship status: Not on file  Other Topics Concern  . Not on file  Social History Narrative   Patient drinks 1-2 cups of caffeine daily.   Patient is right handed.   Family History  Problem Relation Age of Onset  . Cancer Mother   . Dementia Father   . Colon cancer Neg Hx    Allergies  Allergen Reactions  . Aspirin Nausea And Vomiting    Tolerates 18m daily   Prior to Admission medications   Medication Sig Start Date End Date Taking? Authorizing Provider  aspirin EC 81 MG tablet Take 162 mg by mouth at bedtime.    Yes [provider]  Cholecalciferol (VITAMIN D-3) 1000 units CAPS TAKE 1 CAPSULE BY MOUTH ONCE DAILY. 03/19/17  Yes [place], KModena Nunnery MD  citalopram (CELEXA) 20 MG tablet TAKE 1 TABLET BY MOUTH ONCE A DAY FOR DEPRESSION. 02/22/17  Yes [place], KModena Nunnery MD  DAILY MULTIPLE VITAMINS tablet TAKE 1 TABLET BY MOUTH ONCE DAILY. 03/19/17  Yes [place], KModena Nunnery MD  donepezil (ARICEPT) 10 MG tablet TAKE (1) TABLET BY MOUTH AT BEDTIME. 05/20/15  Yes WKathrynn Ducking MD  fluticasone (FLONASE) 50 MCG/ACT nasal spray USE 1 OR 2 SPRAYS IN EACH NOSTRIL EVERY DAY FOR NASAL ALLERGIES. 02/22/17  Yes [place], KModena Nunnery MD  HYDROcodone-acetaminophen (NORCO/VICODIN) 5-325 MG tablet TAKE 1 TABLET BY MOUTH THREE TIMES DAILY AS NEEDED FOR MODERATE PAIN 04/17/17  Yes [place], KModena Nunnery MD  levETIRAcetam (KEPPRA) 250 MG tablet TAKE (1) TABLET BY MOUTH EACH MORNING. 04/13/17  Yes [place], KModena Nunnery MD  lisinopril (PRINIVIL,ZESTRIL) 10 MG tablet Take 10 mg by mouth daily.   Yes [provider]  LORazepam (ATIVAN) 1 MG tablet TAKE (1) TABLET BY  MOUTH AT BEDTIME. 11/21/16  Yes [place], KModena Nunnery MD  lubiprostone (AMITIZA) 24 MCG capsule Take 1 capsule (24 mcg total) by mouth daily with breakfast. MAY TAKE ADDITIONAL DOSE QD PRN. 01/04/17  Yes [place], KModena Nunnery MD  mirtazapine (REMERON) 15 MG tablet TAKE 1/2 TABLET(7.5MG) BY MOUTH AT BEDTIME FOR APPETITE. 04/13/17  Yes [place], KModena Nunnery MD  Multiple Vitamin (MULTIVITAMIN WITH MINERALS) TABS tablet Take 1 tablet by mouth daily. 02/15/17  Yes [place], KModena Nunnery MD  naproxen sodium (ALEVE) 220 MG tablet Take 440 mg by mouth daily as needed.   Yes [provider]  omeprazole (PRILOSEC) 20 MG capsule TAKE  1 CAPSULE BY MOUTH ONCE DAILY FOR RELFUX ESOPHAGITIS OR STOMACH ULCERS. 04/13/17  Yes [place], Modena Nunnery, MD  traZODone (DESYREL) 100 MG tablet TAKE (1) TABLET BY MOUTH AT BEDTIME. 03/19/17  Yes [place], Modena Nunnery, MD  VESICARE 5 MG tablet TAKE ONE TABLET ONCE DAILY FOR BLADDER. 04/13/17  Yes [place], Modena Nunnery, MD  albuterol (PROVENTIL HFA;VENTOLIN HFA) 108 (90 Base) MCG/ACT inhaler Inhale 2 puffs into the lungs every 4 (four) hours as needed for wheezing or shortness of breath. 02/27/17   Alycia Rossetti, MD  nitroGLYCERIN (NITROSTAT) 0.4 MG SL tablet Place 0.4 mg under the tongue every 5 (five) minutes as needed for chest pain.    [provider]   Dg Hand Complete Right  Result Date: 04/22/2017 CLINICAL DATA:  Large draining right hand abscess at the 4th MCP joint. EXAM: RIGHT HAND - COMPLETE 3+ VIEW COMPARISON:  None. FINDINGS: Dorsal soft tissue swelling, most pronounced at the level of the MCP joints. No soft tissue gas, bone destruction or periosteal reaction. No radiopaque foreign body. IMPRESSION: Dorsal soft tissue swelling without underlying bony abnormality. Electronically Signed   By: Claudie Revering M.D.   On: 04/22/2017 15:18    Positive ROS: All other systems have been reviewed and were otherwise negative with the exception of those mentioned in the HPI and as  above.  Objective: Labs cbc Recent Labs    04/22/17 1449 04/22/17 2256  WBC 9.7 9.0  HGB 12.2* 9.3*  HCT 37.2* 30.0*  PLT 173 198    Labs inflam No results for input(s): CRP in the last 72 hours.  Invalid input(s): ESR  Labs coag No results for input(s): INR, PTT in the last 72 hours.  Invalid input(s): PT  Recent Labs    04/22/17 1449 04/22/17 2256  NA 139  --   K 3.4*  --   CL 104  --   CO2 23  --   GLUCOSE 124*  --   BUN 20  --   CREATININE 1.17 1.09  CALCIUM 8.9  --     Physical Exam: Vitals:   04/22/17 2322 04/23/17 0603  BP: (!) 173/80 133/66  Pulse:  69  Resp:  18  Temp:  98.5 F (36.9 C)  SpO2:  100%   General: Alert, no acute distress.  Sleeping on arrival. Mental status: Alert and Oriented x3 Neurologic: Speech Clear and organized, no gross focal findings or movement disorder appreciated. Respiratory: No cyanosis, no use of accessory musculature Cardiovascular: No pedal edema GI: Abdomen is soft and non-tender, non-distended. Skin: Warm and dry.  Extremities: Warm and well perfused w/o edema Psychiatric: Patient is competent for consent with normal mood and affect  MUSCULOSKELETAL:  Right hand with moderate swelling throughout.  Approximately 3 cm long by 1 cm wide ulcer at the distal, dorsal aspect of his right fourth metacarpal.  Tissue is boggy and there is some serous/purulent drainage.  There is a second smaller round wound between the MP and PIP joints dorsally approximately 0.75 cm in diameter, also with some mild drainage.  He has no volar sided pain or wound.  Flexion is limited some mainly due to swelling.  Extension of the fourth finger is intact but he is not able to reach full extension. Other extremities are atraumatic with painless ROM and NVI.  Assessment / Plan: Active Problems:   Essential hypertension   GERD   Dementia   Seizure (HCC)   Cellulitis of hand, right   Cellulitis  Cellulitis of hand   Right hand  dorsal/extensor cellulitis  He is nontoxic appearing, WBC normal.  No bony changes on x-ray  Soreness and swelling have improved some per the patient since initiation of IV antibiotics.   At this point, I do still think that this will need surgical I&D.  Local wound care for now.  Elevate extremity.  Smoking cessation.  He denies diabetes, but will check A1c.  Will keep n.p.o.     Dr. Alain Marion to discuss w/ trauma team this morning about the patient.  Otherwise, plan for I/D this afternoon.  The risks benefits and alternatives of the procedure were discussed with the patient including but not limited to infection, bleeding, nerve injury, the need for revision surgery, blood clots, cardiopulmonary complications, morbidity, mortality, among others.  The patient verbalizes understanding and wishes to proceed.     Prudencio Burly III PA-C 04/23/2017 6:52 AM

## 2017-04-23 NOTE — Op Note (Signed)
04/23/2017  2:42 PM  PATIENT:  [name]    PRE-OPERATIVE DIAGNOSIS:  infection  POST-OPERATIVE DIAGNOSIS:  Same  PROCEDURE:  IRRIGATION AND DEBRIDEMENT HAND  SURGEON:  Ventura Hollenbeck, Ernesta Amble, MD  ASSISTANT: Roxan Hockey, PA-C, he was present and scrubbed throughout the case, critical for completion in a timely fashion, and for retraction, instrumentation, and closure.   ANESTHESIA:   gen  PREOPERATIVE INDICATIONS:  [name] is a  83 y.o. male with a diagnosis of infection who failed conservative measures and elected for surgical management.    The risks benefits and alternatives were discussed with the patient preoperatively including but not limited to the risks of infection, bleeding, nerve injury, cardiopulmonary complications, the need for revision surgery, among others, and the patient was willing to proceed.  OPERATIVE IMPLANTS: none  OPERATIVE FINDINGS: purulent tissue  BLOOD LOSS: min  COMPLICATIONS: none  TOURNIQUET TIME: 31min  OPERATIVE PROCEDURE:  Patient was identified in the preoperative holding area and site was marked by me He was transported to the operating theater and placed on the table in supine position taking care to pad all bony prominences. After a preincinduction time out anesthesia was induced. The right upper extremity was prepped and draped in normal sterile fashion and a pre-incision timeout was performed. He received ancef after cultures for preoperative antibiotics.   I made a longitudinal incision from his dorsal hand wound to mid shaft of his proximal phalanx where his second wound was.  These both these areas had a fair amount of purulent fluid and necrotic tissue.  This infection had tunneled from these 2 sites and has also tunneled down around the volar surface of his phalanx.  Did not violate the MP joint capsule or his extensor tendons.  I debrided the necrotic tissue with a pair of scissors this was an excisional debridement.  I then  performed a thorough irrigation I confirmed that we had opened up all space including the volar extension of the infection.  I then placed sterile packing around both sides of the phalanx and loosely closed the incision with a nylon stitch.  A sterile dressing was applied he is awoken and taken to the PACU in stable condition  POST OPERATIVE PLAN: Pull packing in 2-3 days. Will follow wound. Keep dressing clean and dry. Chemical dvt px per primary, recommend mobilization from Ortho standpoint

## 2017-04-23 NOTE — Discharge Instructions (Signed)
Elevate hand as much as possible to reduce pain / swelling. No lifting with right hand.  Keep dressings on and dry until follow up.

## 2017-04-23 NOTE — Consult Note (Addendum)
ORTHOPAEDIC CONSULTATION  REQUESTING PHYSICIAN: Mariel Aloe, MD  Chief Complaint: Right hand pain/wound  HPI: [name] is a 83 y.o. male who complains of right hand pain and wound starting about 6 days ago as a "water blister" that he had his wife popped.  He denies known injury or bite.  He has had some increasing pain, swelling, and drainage and presented to the emergency department.  He was placed on vancomycin Zosyn and orthopedics was consulted for evaluation.  He has remained afebrile.  WBC is WNL.  There are no bony changes on x-ray.  Today his pain is controlled.  He denies fevers or systemic symptoms..  Last meal was yesterday.  He denies history of diabetes.  He does smoke occasionally.   Past Medical History:  Diagnosis Date  . Abnormality of gait 08/01/2013  . Arthritis   . Blindness of left eye    Posttraumatic  . Carotid artery disease ([place])   . Colon polyp   . Constipation   . Diaphragmatic hernia   . Disc disease, degenerative, cervical   . Diverticulitis   . ED (erectile dysfunction)   . Gastroparesis   . GERD (gastroesophageal reflux disease)   . Glaucoma   . Heart disease   . Hyperlipidemia   . Hypertension   . Insomnia   . Memory difficulty 08/04/2014  . Nicotine dependence   . Numbness and tingling of right arm 08/04/2014  . Osteoarthritis   . Overactive bladder   . Radiculopathy   . Shortness of breath    with activity  . Sleep apnea    does not use CPAP.  Tested maybe 5- 10 years ago  . Stroke [facility])    Left side weakness.  . Unilateral inguinal hernia    Past Surgical History:  Procedure Laterality Date  . ANTERIOR CERVICAL DECOMP/DISCECTOMY FUSION N/A 07/01/2012   Procedure: ANTERIOR CERVICAL DECOMPRESSION/DISCECTOMY FUSION CERVICAL FIVE-SIX [name] REMOVAL CERVICAL SIX-SEVEN;  Surgeon: [name], MD;  Location: [facility];  Service: Neurosurgery;  Laterality: N/A;  . CERVICAL FUSION    . COLONOSCOPY   08/04/2002     RMR:  Normal rectum/Pancolonic diverticula/Colonic polyps as described above, biopsied and/or snared  . COLONOSCOPY  60/08/2009   RMR: normal rectum/left and right sided diverticula/multiple colonic polyps. tubular adenomas. surveillance due 2014  . COLONOSCOPY WITH ESOPHAGOGASTRODUODENOSCOPY (EGD) N/A 12/18/2012   Dr. [name]:  Colonic diverticulosis. Multiple colonic polyps-hyperplastic polyps and tubular adenoma. Friable anal canal hemorrhoids. EGD with chronic atrophic gastritis  . ESOPHAGOGASTRODUODENOSCOPY  04/14/2002   BZM:CEYEMVVK'P' ring, status post dilation as described above/Hiatal hernia, focal antral erosions of uncertain clinical significance  . ESOPHAGOGASTRODUODENOSCOPY  06/09/2009   RMR: normal esophagus s/p dilator/small hiatal hernia otherwise normal  . HERNIA REPAIR Right    inguinal  . INGUINAL HERNIA REPAIR Left 04/15/2014   Procedure: LEFT INGUINAL HERNIORRHAPHY WITH MESH;  Surgeon: [name], MD;  Location: [facility];  Service: General;  Laterality: Left;  . INSERTION OF MESH Left 04/15/2014   Procedure: INSERTION OF MESH;  Surgeon: [name], MD;  Location: [facility];  Service: General;  Laterality: Left;   Social History   Socioeconomic History  . Marital status: Married    Spouse name: Not on file  . Number of children: 2  . Years of education: hs  . Highest education level: Not on file  Occupational History  . Occupation: Retired    Fish farm manager: RETIRED  Social Needs  . Financial  resource strain: Not on file  . Food insecurity:    Worry: Not on file    Inability: Not on file  . Transportation needs:    Medical: Not on file    Non-medical: Not on file  Tobacco Use  . Smoking status: Former Smoker    Packs/day: 0.50    Years: 60.00    Pack years: 30.00    Types: Cigarettes  . Smokeless tobacco: Never Used  Substance and Sexual Activity  . Alcohol use: No  . Drug use: No  . Sexual activity: Not on file  Lifestyle  . Physical activity:    Days per  week: Not on file    Minutes per session: Not on file  . Stress: Not on file  Relationships  . Social connections:    Talks on phone: Not on file    Gets together: Not on file    Attends religious service: Not on file    Active member of club or organization: Not on file    Attends meetings of clubs or organizations: Not on file    Relationship status: Not on file  Other Topics Concern  . Not on file  Social History Narrative   Patient drinks 1-2 cups of caffeine daily.   Patient is right handed.   Family History  Problem Relation Age of Onset  . Cancer Mother   . Dementia Father   . Colon cancer Neg Hx    Allergies  Allergen Reactions  . Aspirin Nausea And Vomiting    Tolerates 66m daily   Prior to Admission medications   Medication Sig Start Date End Date Taking? Authorizing Provider  aspirin EC 81 MG tablet Take 162 mg by mouth at bedtime.    Yes [provider]  Cholecalciferol (VITAMIN D-3) 1000 units CAPS TAKE 1 CAPSULE BY MOUTH ONCE DAILY. 03/19/17  Yes [place], KModena Nunnery MD  citalopram (CELEXA) 20 MG tablet TAKE 1 TABLET BY MOUTH ONCE A DAY FOR DEPRESSION. 02/22/17  Yes [place], KModena Nunnery MD  DAILY MULTIPLE VITAMINS tablet TAKE 1 TABLET BY MOUTH ONCE DAILY. 03/19/17  Yes [place], KModena Nunnery MD  donepezil (ARICEPT) 10 MG tablet TAKE (1) TABLET BY MOUTH AT BEDTIME. 05/20/15  Yes WKathrynn Ducking MD  fluticasone (FLONASE) 50 MCG/ACT nasal spray USE 1 OR 2 SPRAYS IN EACH NOSTRIL EVERY DAY FOR NASAL ALLERGIES. 02/22/17  Yes [place], KModena Nunnery MD  HYDROcodone-acetaminophen (NORCO/VICODIN) 5-325 MG tablet TAKE 1 TABLET BY MOUTH THREE TIMES DAILY AS NEEDED FOR MODERATE PAIN 04/17/17  Yes [place], KModena Nunnery MD  levETIRAcetam (KEPPRA) 250 MG tablet TAKE (1) TABLET BY MOUTH EACH MORNING. 04/13/17  Yes [place], KModena Nunnery MD  lisinopril (PRINIVIL,ZESTRIL) 10 MG tablet Take 10 mg by mouth daily.   Yes [provider]  LORazepam (ATIVAN) 1 MG tablet TAKE (1) TABLET BY  MOUTH AT BEDTIME. 11/21/16  Yes [place], KModena Nunnery MD  lubiprostone (AMITIZA) 24 MCG capsule Take 1 capsule (24 mcg total) by mouth daily with breakfast. MAY TAKE ADDITIONAL DOSE QD PRN. 01/04/17  Yes [place], KModena Nunnery MD  mirtazapine (REMERON) 15 MG tablet TAKE 1/2 TABLET(7.5MG) BY MOUTH AT BEDTIME FOR APPETITE. 04/13/17  Yes , KModena Nunnery MD  Multiple Vitamin (MULTIVITAMIN WITH MINERALS) TABS tablet Take 1 tablet by mouth daily. 02/15/17  Yes , KModena Nunnery MD  naproxen sodium (ALEVE) 220 MG tablet Take 440 mg by mouth daily as needed.   Yes [provider]  omeprazole (PRILOSEC) 20 MG capsule TAKE  1 CAPSULE BY MOUTH ONCE DAILY FOR RELFUX ESOPHAGITIS OR STOMACH ULCERS. 04/13/17  Yes [place], Modena Nunnery, MD  traZODone (DESYREL) 100 MG tablet TAKE (1) TABLET BY MOUTH AT BEDTIME. 03/19/17  Yes [place], Modena Nunnery, MD  VESICARE 5 MG tablet TAKE ONE TABLET ONCE DAILY FOR BLADDER. 04/13/17  Yes [place], Modena Nunnery, MD  albuterol (PROVENTIL HFA;VENTOLIN HFA) 108 (90 Base) MCG/ACT inhaler Inhale 2 puffs into the lungs every 4 (four) hours as needed for wheezing or shortness of breath. 02/27/17   Alycia Rossetti, MD  nitroGLYCERIN (NITROSTAT) 0.4 MG SL tablet Place 0.4 mg under the tongue every 5 (five) minutes as needed for chest pain.    [provider]   Dg Hand Complete Right  Result Date: 04/22/2017 CLINICAL DATA:  Large draining right hand abscess at the 4th MCP joint. EXAM: RIGHT HAND - COMPLETE 3+ VIEW COMPARISON:  None. FINDINGS: Dorsal soft tissue swelling, most pronounced at the level of the MCP joints. No soft tissue gas, bone destruction or periosteal reaction. No radiopaque foreign body. IMPRESSION: Dorsal soft tissue swelling without underlying bony abnormality. Electronically Signed   By: Claudie Revering M.D.   On: 04/22/2017 15:18    Positive ROS: All other systems have been reviewed and were otherwise negative with the exception of those mentioned in the HPI and as  above.  Objective: Labs cbc Recent Labs    04/22/17 1449 04/22/17 2256  WBC 9.7 9.0  HGB 12.2* 9.3*  HCT 37.2* 30.0*  PLT 173 198    Labs inflam No results for input(s): CRP in the last 72 hours.  Invalid input(s): ESR  Labs coag No results for input(s): INR, PTT in the last 72 hours.  Invalid input(s): PT  Recent Labs    04/22/17 1449 04/22/17 2256  NA 139  --   K 3.4*  --   CL 104  --   CO2 23  --   GLUCOSE 124*  --   BUN 20  --   CREATININE 1.17 1.09  CALCIUM 8.9  --     Physical Exam: Vitals:   04/22/17 2322 04/23/17 0603  BP: (!) 173/80 133/66  Pulse:  69  Resp:  18  Temp:  98.5 F (36.9 C)  SpO2:  100%   General: Alert, no acute distress.  Sleeping on arrival. Mental status: Alert and Oriented x3 Neurologic: Speech Clear and organized, no gross focal findings or movement disorder appreciated. Respiratory: No cyanosis, no use of accessory musculature Cardiovascular: No pedal edema GI: Abdomen is soft and non-tender, non-distended. Skin: Warm and dry.  Extremities: Warm and well perfused w/o edema Psychiatric: Patient is competent for consent with normal mood and affect  MUSCULOSKELETAL:  Right hand with moderate swelling throughout.  Approximately 3 cm long by 1 cm wide ulcer at the distal, dorsal aspect of his right fourth metacarpal.  Tissue is boggy and there is some serous/purulent drainage.  There is a second smaller round wound between the MP and PIP joints dorsally approximately 0.75 cm in diameter, also with some mild drainage.  He has no volar sided pain or wound.  Flexion is limited some mainly due to swelling.  Extension of the fourth finger is intact but he is not able to reach full extension. Other extremities are atraumatic with painless ROM and NVI.  Assessment / Plan: Active Problems:   Essential hypertension   GERD   Dementia   Seizure (HCC)   Cellulitis of hand, right   Cellulitis  Cellulitis of hand   Right hand  dorsal/extensor cellulitis  He is nontoxic appearing, WBC normal.  No bony changes on x-ray  Soreness and swelling have improved some per the patient since initiation of IV antibiotics.   At this point, I do still think that this will need surgical I&D.  Local wound care for now.  Elevate extremity.  Smoking cessation.  He denies diabetes, but will check A1c.  Will keep n.p.o.     Dr. Alain Marion to discuss w/ trauma team this morning about the patient.  Otherwise, plan for I/D this afternoon.  The risks benefits and alternatives of the procedure were discussed with the patient including but not limited to infection, bleeding, nerve injury, the need for revision surgery, blood clots, cardiopulmonary complications, morbidity, mortality, among others.  The patient verbalizes understanding and wishes to proceed.     Prudencio Burly III PA-C 04/23/2017 6:52 AM

## 2017-04-23 NOTE — Progress Notes (Signed)
Patient arrived to the unit via [facility] from [facility]  Patient is alert and oriented x 3. Disoriented to time.  Skin assessment complete.  No skin issues.  Educated the patient on how to reach the staff on the unit.  Lowered the bed, activated the bed alarm and placed the call light with reach.  Will continue to monitor the patient and notify MD as needed

## 2017-04-23 NOTE — Progress Notes (Signed)
PROGRESS NOTE    [name]  PRF:[id number] DOB: 04/21/1934 DOA: 04/22/2017 PCP: Alycia Rossetti, MD   Brief Narrative: [name] is a 83 y.o. male with a history of dementia, seizures, patient presented to the ED secondary to right hand swelling.  He is found to have significant erythema concerning for cellulitis in addition to purulent drainage.  Orthopedic surgery consulted and plans for I&D.  Patient started on vancomycin IV with improvement of cellulitis.   Assessment & Plan:   Active Problems:   Essential hypertension   GERD   Dementia   Seizure (HCC)   Cellulitis of hand, right   Cellulitis   Cellulitis of hand   Purulent cellulitis of right hand Cellulitis aspect has significantly improved. ?Abscess. Surgery consulted and planning I&D today -Hand surgery recommendations -Continue Vancomycin -Follow-up blood cultures  Essential hypertension Hypertensive overnight improved this morning. -Continue lisinopril  Seizure disorder Stable -Continue Keppra  GERD -Continue PPI (protonix while inpatient)  Dementia -Continue Aricept  Depression Insomnia -Continue Remeron, lorazepam, Celexa  Urinary incontinence -Continue Vesicare  Chronic pain -Continue home Norco as needed   DVT prophylaxis: Lovenox Code Status:   Code Status: DNR Family Communication: None at bedside Disposition Plan: Discharge home in 24-48 hours pending improvement of cellulitis and management of possible abscess   Consultants:   Orthopedic surgery  Procedures:   None  Antimicrobials:  Vancomycin    Subjective: Patient reports some hand pain.  Still having some drainage.  Objective: Vitals:   04/22/17 2130 04/22/17 2250 04/22/17 2322 04/23/17 0603  BP: (!) 171/84 (!) 175/78 (!) 173/80 133/66  Pulse: 75 79  69  Resp: 17 18  18   Temp:  98.2 F (36.8 C)  98.5 F (36.9 C)  TempSrc:  Oral  Oral  SpO2: 97% 98%  100%  Weight:      Height:        Intake/Output  Summary (Last 24 hours) at 04/23/2017 1008 Last data filed at 04/23/2017 0604 Gross per 24 hour  Intake 1000 ml  Output 100 ml  Net 900 ml   Filed Weights   04/22/17 1421  Weight: 74.8 kg (165 lb)    Examination:  General exam: Appears calm and comfortable Respiratory system: Clear to auscultation. Respiratory effort normal. Cardiovascular system: S1 & S2 heard, RRR. No murmurs, rubs, gallops or clicks. Gastrointestinal system: Abdomen is nondistended, soft and nontender. Normal bowel sounds heard. Central nervous system: Alert and oriented. No focal neurological deficits. Extremities: No edema. No calf tenderness.  Right hand with purulent drainage from the dorsal surface no surrounding erythema.  Skin: No cyanosis. No rashes Psychiatry: Judgement and insight appear normal. Mood & affect appropriate.     Data Reviewed: I have personally reviewed following labs and imaging studies  CBC: Recent Labs  Lab 04/22/17 1449 04/22/17 2256 04/23/17 0509  WBC 9.7 9.0 6.8  NEUTROABS 5.8  --   --   HGB 12.2* 9.3* 10.6*  HCT 37.2* 30.0* 32.5*  MCV 93.7 96.5 92.6  PLT 173 198 935   Basic Metabolic Panel: Recent Labs  Lab 04/22/17 1449 04/22/17 2256 04/23/17 0509  NA 139  --  139  K 3.4*  --  3.3*  CL 104  --  108  CO2 23  --  24  GLUCOSE 124*  --  99  BUN 20  --  13  CREATININE 1.17 1.09 0.99  CALCIUM 8.9  --  8.1*   GFR: Estimated Creatinine Clearance: 61.4 mL/min (by C-G  formula based on SCr of 0.99 mg/dL). Liver Function Tests: Recent Labs  Lab 04/22/17 1449  AST 22  ALT 12*  ALKPHOS 87  BILITOT 0.8  PROT 7.6  ALBUMIN 3.2*   No results for input(s): LIPASE, AMYLASE in the last 168 hours. No results for input(s): AMMONIA in the last 168 hours. Coagulation Profile: No results for input(s): INR, PROTIME in the last 168 hours. Cardiac Enzymes: No results for input(s): CKTOTAL, CKMB, CKMBINDEX, TROPONINI in the last 168 hours. BNP (last 3 results) No results  for input(s): PROBNP in the last 8760 hours. HbA1C: Recent Labs    04/23/17 0509  HGBA1C 5.4   CBG: No results for input(s): GLUCAP in the last 168 hours. Lipid Profile: No results for input(s): CHOL, HDL, LDLCALC, TRIG, CHOLHDL, LDLDIRECT in the last 72 hours. Thyroid Function Tests: No results for input(s): TSH, T4TOTAL, FREET4, T3FREE, THYROIDAB in the last 72 hours. Anemia Panel: No results for input(s): VITAMINB12, FOLATE, FERRITIN, TIBC, IRON, RETICCTPCT in the last 72 hours. Sepsis Labs: Recent Labs  Lab 04/22/17 1449 04/22/17 1705  LATICACIDVEN 1.4 1.2    Recent Results (from the past 240 hour(s))  Blood culture (routine x 2)     Status: None (Preliminary result)   Collection Time: 04/22/17  2:49 PM  Result Value Ref Range Status   Specimen Description LEFT ANTECUBITAL  Final   Special Requests   Final    BOTTLES DRAWN AEROBIC AND ANAEROBIC Blood Culture adequate volume   Culture   Final    NO GROWTH < 24 HOURS Performed at [facility], [address] [place], [place] [address]    Report Status PENDING  Incomplete  Blood culture (routine x 2)     Status: None (Preliminary result)   Collection Time: 04/22/17  2:49 PM  Result Value Ref Range Status   Specimen Description LEFT ANTECUBITAL  Final   Special Requests   Final    BOTTLES DRAWN AEROBIC AND ANAEROBIC Blood Culture adequate volume   Culture   Final    NO GROWTH < 24 HOURS Performed at [facility], [address] [place], [place] [address]    Report Status PENDING  Incomplete         Radiology Studies: Dg Hand Complete Right  Result Date: 04/22/2017 CLINICAL DATA:  Large draining right hand abscess at the 4th MCP joint. EXAM: RIGHT HAND - COMPLETE 3+ VIEW COMPARISON:  None. FINDINGS: Dorsal soft tissue swelling, most pronounced at the level of the MCP joints. No soft tissue gas, bone destruction or periosteal reaction. No radiopaque foreign body. IMPRESSION: Dorsal soft tissue swelling without  underlying bony abnormality. Electronically Signed   By: Claudie Revering M.D.   On: 04/22/2017 15:18        Scheduled Meds: . aspirin EC  162 mg Oral QHS  . citalopram  20 mg Oral Daily  . darifenacin  7.5 mg Oral Daily  . donepezil  10 mg Oral QHS  . enoxaparin (LOVENOX) injection  40 mg Subcutaneous Q24H  . ketorolac  15 mg Intravenous Q6H  . levETIRAcetam  250 mg Oral BID  . lisinopril  10 mg Oral Daily  . LORazepam  1 mg Oral QHS  . lubiprostone  24 mcg Oral Q breakfast  . mirtazapine  7.5 mg Oral QHS  . pantoprazole  40 mg Oral Daily  . pneumococcal 23 valent vaccine  0.5 mL Intramuscular Tomorrow-1000  . traZODone  100 mg Oral QHS   Continuous Infusions: . vancomycin  LOS: 1 day     Cordelia Poche, MD [facility] 04/23/2017, 10:08 AM Pager: [phone]  If 7PM-7AM, please contact night-coverage [email or web address] Password [email or web address] 04/23/2017, 10:08 AM

## 2017-04-23 NOTE — Anesthesia Procedure Notes (Signed)
Procedure Name: LMA Insertion Date/Time: 04/23/2017 2:01 PM Performed by: Freddie Breech, CRNA Pre-anesthesia Checklist: Patient identified, Emergency Drugs available, Suction available and Patient being monitored Patient Re-evaluated:Patient Re-evaluated prior to induction Oxygen Delivery Method: Circle System Utilized Preoxygenation: Pre-oxygenation with 100% oxygen Induction Type: IV induction Ventilation: Mask ventilation without difficulty LMA: LMA inserted LMA Size: 4.0 Number of attempts: 1 Airway Equipment and Method: Bite block Placement Confirmation: positive ETCO2 Tube secured with: Tape Dental Injury: Teeth and Oropharynx as per pre-operative assessment

## 2017-04-24 ENCOUNTER — Encounter (HOSPITAL_COMMUNITY): Payer: Self-pay | Admitting: Orthopedic Surgery

## 2017-04-24 NOTE — Anesthesia Postprocedure Evaluation (Signed)
Anesthesia Post Note  Patient: Anatole Apollo  Procedure(s) Performed: IRRIGATION AND DEBRIDEMENT HAND (Right Hand)     Patient location during evaluation: PACU Anesthesia Type: General Level of consciousness: awake and alert Pain management: pain level controlled Vital Signs Assessment: post-procedure vital signs reviewed and stable Respiratory status: spontaneous breathing, nonlabored ventilation, respiratory function stable and patient connected to nasal cannula oxygen Cardiovascular status: blood pressure returned to baseline and stable Postop Assessment: no apparent nausea or vomiting Anesthetic complications: no    Last Vitals:  Vitals:   04/23/17 2125 04/24/17 0530  BP: (!) 152/55 (!) 155/62  Pulse: 64 73  Resp: 18 17  Temp: 36.5 C 36.8 C  SpO2: 97% 98%    Last Pain:  Vitals:   04/24/17 0612  TempSrc:   PainSc: Asleep                 Adina Puzzo

## 2017-04-24 NOTE — Progress Notes (Signed)
Pharmacy Antibiotic Note  [name] is a 83 y.o. male admitted on 04/22/2017 with cellulitis.  Pharmacy has been consulted for vancomycin dosing.  Patient is s/p I&D with ortho yesterday. Per discussion with Dr. Lonny Prude, cellulitis was tracking up his arm- is looking improved today. Tissue cx from yesterday with rare GPC in gram stain.  Plan: Vancomycin 1250mg  IV every 24 hours.  Goal trough 10-15 mcg/mL.  Follow renal function, c/s, de-escalation/LOT  Height: 5\' 10"  (177.8 cm) Weight: 165 lb (74.8 kg) IBW/kg (Calculated) : 73  Temp (24hrs), Avg:97.9 F (36.6 C), Min:97.6 F (36.4 C), Max:98.2 F (36.8 C)  Recent Labs  Lab 04/22/17 1449 04/22/17 1705 04/22/17 2256 04/23/17 0509  WBC 9.7  --  9.0 6.8  CREATININE 1.17  --  1.09 0.99  LATICACIDVEN 1.4 1.2  --   --     Estimated Creatinine Clearance: 61.4 mL/min (by C-G formula based on SCr of 0.99 mg/dL).    Allergies  Allergen Reactions  . Aspirin Nausea And Vomiting    Tolerates 81mg  daily    Antimicrobials this admission: Vancomycin 4/21 >>  Zosyn 4/21 x1  Dose adjustments this admission: n/a  Microbiology results: 4/22 R hand tissue: rare GPC 4/21 Bcx: ngtd  Thank you for allowing pharmacy to be a part of this patient's care.  Jaizon Deroos D. Ceana Fiala, PharmD, BCPS Clinical Pharmacist Clinical Phone for 04/24/2017 until 3:30pm: [phone] If after 3:30pm, please call main pharmacy at [phone] 04/24/2017 11:05 AM

## 2017-04-24 NOTE — Progress Notes (Signed)
PROGRESS NOTE    [name]  WIO:[id number] DOB: Jun 22, 1934 DOA: 04/22/2017 PCP: Alycia Rossetti, MD   Brief Narrative: [name] is a 83 y.o. male with a history of dementia, seizures, patient presented to the ED secondary to right hand swelling.  He is found to have significant erythema concerning for cellulitis in addition to purulent drainage.  Orthopedic surgery consulted and plans for I&D.  Patient started on vancomycin IV with improvement of cellulitis.   Assessment & Plan:   Active Problems:   Essential hypertension   GERD   Dementia   Urinary incontinence   Depression, major, single episode, moderate (HCC)   Chronic insomnia   Seizure (HCC)   Cellulitis of hand, right   Cellulitis   Cellulitis of hand   Purulent cellulitis of right hand Abscess of right hand Cellulitis aspect has significantly improved. S/p I&D and debridement on 4/22. -Hand surgery recommendations -Continue Vancomycin -Wound cultures pending -Follow-up blood cultures (no growth to date)  Essential hypertension Slightly hypertensive. -Continue lisinopril  Seizure disorder Stable -Continue Keppra  GERD -Continue PPI (protonix while inpatient)  Dementia -Continue Aricept  Depression Insomnia -Continue Remeron, lorazepam, Celexa  Urinary incontinence -Continue Vesicare  Chronic pain -Continue home Norco as needed   DVT prophylaxis: Lovenox Code Status:   Code Status: DNR Family Communication: None at bedside Disposition Plan: Discharge home likely in 48 hours pending improvement of cellulitis and management of abscess   Consultants:   Orthopedic surgery  Procedures:   None  Antimicrobials:  Vancomycin    Subjective: Hand pain is improved. No issues overnight. Afebrile.  Objective: Vitals:   04/23/17 1601 04/23/17 1610 04/23/17 2125 04/24/17 0530  BP: (!) 154/70  (!) 152/55 (!) 155/62  Pulse: 65 64 64 73  Resp: (!) 9 15 18 17   Temp:  97.6 F (36.4 C) 97.7  F (36.5 C) 98.2 F (36.8 C)  TempSrc:   Oral Oral  SpO2: 96% 99% 97% 98%  Weight:      Height:        Intake/Output Summary (Last 24 hours) at 04/24/2017 0846 Last data filed at 04/24/2017 0400 Gross per 24 hour  Intake 1895 ml  Output 10 ml  Net 1885 ml   Filed Weights   04/22/17 1421 04/23/17 1233  Weight: 74.8 kg (165 lb) 74.8 kg (165 lb)    Examination:  General exam: Appears calm and comfortable Respiratory system: Clear to auscultation. Respiratory effort normal. Cardiovascular system: S1 & S2 heard, RRR. No murmurs, rubs, gallops or clicks. Gastrointestinal system: Abdomen is nondistended, soft and nontender. No organomegaly or masses felt. Normal bowel sounds heard. Central nervous system: Alert and oriented. No focal neurological deficits. Extremities: No edema. No calf tenderness. Right hand/forearm with clean dressing/ACE bandage Skin: No cyanosis. No rashes Psychiatry: Judgement and insight appear normal. Mood & affect appropriate.      Data Reviewed: I have personally reviewed following labs and imaging studies  CBC: Recent Labs  Lab 04/22/17 1449 04/22/17 2256 04/23/17 0509  WBC 9.7 9.0 6.8  NEUTROABS 5.8  --   --   HGB 12.2* 9.3* 10.6*  HCT 37.2* 30.0* 32.5*  MCV 93.7 96.5 92.6  PLT 173 198 384   Basic Metabolic Panel: Recent Labs  Lab 04/22/17 1449 04/22/17 2256 04/23/17 0509  NA 139  --  139  K 3.4*  --  3.3*  CL 104  --  108  CO2 23  --  24  GLUCOSE 124*  --  99  BUN 20  --  13  CREATININE 1.17 1.09 0.99  CALCIUM 8.9  --  8.1*   GFR: Estimated Creatinine Clearance: 61.4 mL/min (by C-G formula based on SCr of 0.99 mg/dL). Liver Function Tests: Recent Labs  Lab 04/22/17 1449  AST 22  ALT 12*  ALKPHOS 87  BILITOT 0.8  PROT 7.6  ALBUMIN 3.2*   No results for input(s): LIPASE, AMYLASE in the last 168 hours. No results for input(s): AMMONIA in the last 168 hours. Coagulation Profile: No results for input(s): INR, PROTIME in  the last 168 hours. Cardiac Enzymes: No results for input(s): CKTOTAL, CKMB, CKMBINDEX, TROPONINI in the last 168 hours. BNP (last 3 results) No results for input(s): PROBNP in the last 8760 hours. HbA1C: Recent Labs    04/23/17 0509  HGBA1C 5.4   CBG: No results for input(s): GLUCAP in the last 168 hours. Lipid Profile: No results for input(s): CHOL, HDL, LDLCALC, TRIG, CHOLHDL, LDLDIRECT in the last 72 hours. Thyroid Function Tests: No results for input(s): TSH, T4TOTAL, FREET4, T3FREE, THYROIDAB in the last 72 hours. Anemia Panel: No results for input(s): VITAMINB12, FOLATE, FERRITIN, TIBC, IRON, RETICCTPCT in the last 72 hours. Sepsis Labs: Recent Labs  Lab 04/22/17 1449 04/22/17 1705  LATICACIDVEN 1.4 1.2    Recent Results (from the past 240 hour(s))  Blood culture (routine x 2)     Status: None (Preliminary result)   Collection Time: 04/22/17  2:49 PM  Result Value Ref Range Status   Specimen Description LEFT ANTECUBITAL  Final   Special Requests   Final    BOTTLES DRAWN AEROBIC AND ANAEROBIC Blood Culture adequate volume   Culture   Final    NO GROWTH < 24 HOURS Performed at [facility], [address], [place], [place] [address]    Report Status PENDING  Incomplete  Blood culture (routine x 2)     Status: None (Preliminary result)   Collection Time: 04/22/17  2:49 PM  Result Value Ref Range Status   Specimen Description LEFT ANTECUBITAL  Final   Special Requests   Final    BOTTLES DRAWN AEROBIC AND ANAEROBIC Blood Culture adequate volume   Culture   Final    NO GROWTH < 24 HOURS Performed at [facility], [address], [place], [place] [address]    Report Status PENDING  Incomplete  Aerobic/Anaerobic Culture (surgical/deep wound)     Status: None (Preliminary result)   Collection Time: 04/23/17  2:22 PM  Result Value Ref Range Status   Specimen Description TISSUE RIGHT HAND  Final   Special Requests NONE  Final   Gram Stain   Final    ABUNDANT WBC  PRESENT,BOTH PMN AND MONONUCLEAR RARE GRAM POSITIVE COCCI Performed at [facility], [place] [address] [place],  [address]    Culture PENDING  Incomplete   Report Status PENDING  Incomplete         Radiology Studies: Dg Hand Complete Right  Result Date: 04/22/2017 CLINICAL DATA:  Large draining right hand abscess at the 4th MCP joint. EXAM: RIGHT HAND - COMPLETE 3+ VIEW COMPARISON:  None. FINDINGS: Dorsal soft tissue swelling, most pronounced at the level of the MCP joints. No soft tissue gas, bone destruction or periosteal reaction. No radiopaque foreign body. IMPRESSION: Dorsal soft tissue swelling without underlying bony abnormality. Electronically Signed   By: Claudie Revering M.D.   On: 04/22/2017 15:18        Scheduled Meds: . aspirin EC  162 mg  Oral QHS  . citalopram  20 mg Oral Daily  . darifenacin  7.5 mg Oral Daily  . docusate sodium  100 mg Oral BID  . donepezil  10 mg Oral QHS  . enoxaparin (LOVENOX) injection  40 mg Subcutaneous Q24H  . ketorolac  15 mg Intravenous Q6H  . levETIRAcetam  250 mg Oral BID  . lisinopril  10 mg Oral Daily  . LORazepam  1 mg Oral QHS  . lubiprostone  24 mcg Oral Q breakfast  . mirtazapine  7.5 mg Oral QHS  . pantoprazole  40 mg Oral Daily  . traZODone  100 mg Oral QHS   Continuous Infusions: . lactated ringers Stopped (04/24/17 0543)  . vancomycin Stopped (04/23/17 2020)     LOS: 2 days     Cordelia Poche, MD [facility] 04/24/2017, 8:46 AM Pager: [phone]  If 7PM-7AM, please contact night-coverage [email or web address] Password [facility] 04/24/2017, 8:46 AM

## 2017-04-24 NOTE — Progress Notes (Signed)
    Subjective: Patient reports pain as mild, improved.    Objective:   VITALS:   Vitals:   04/23/17 1610 04/23/17 2125 04/24/17 0530 04/24/17 0845  BP:  (!) 152/55 (!) 155/62 (!) 145/65  Pulse: 64 64 73   Resp: 15 18 17    Temp: 97.6 F (36.4 C) 97.7 F (36.5 C) 98.2 F (36.8 C)   TempSrc:  Oral Oral   SpO2: 99% 97% 98%   Weight:      Height:       CBC Latest Ref Rng & Units 04/23/2017 04/22/2017 04/22/2017  WBC 4.0 - 10.5 K/uL 6.8 9.0 9.7  Hemoglobin 13.0 - 17.0 g/dL 10.6(L) 9.3(L) 12.2(L)  Hematocrit 39.0 - 52.0 % 32.5(L) 30.0(L) 37.2(L)  Platelets 150 - 400 K/uL 167 198 173   BMP Latest Ref Rng & Units 04/23/2017 04/22/2017 04/22/2017  Glucose 65 - 99 mg/dL 99 - 124(H)  BUN 6 - 20 mg/dL 13 - 20  Creatinine 0.61 - 1.24 mg/dL 0.99 1.09 1.17  BUN/Creat Ratio 6 - 22 (calc) - - -  Sodium 135 - 145 mmol/L 139 - 139  Potassium 3.5 - 5.1 mmol/L 3.3(L) - 3.4(L)  Chloride 101 - 111 mmol/L 108 - 104  CO2 22 - 32 mmol/L 24 - 23  Calcium 8.9 - 10.3 mg/dL 8.1(L) - 8.9   Intake/Output      04/22 0701 - 04/23 0700 04/23 0701 - 04/24 0700   P.O. 120 360   I.V. (mL/kg) 1525 (20.4)    IV Piggyback 250    Total Intake(mL/kg) 1895 (25.3) 360 (4.8)   Urine (mL/kg/hr)  100 (0.2)   Stool  0   Blood 10    Total Output 10 100   Net +1885 +260           Physical Exam: General: NAD.  Supine in bed.  Calm, conversant.  MSK RUE: Hand warm.  Dressings removed and packing was taken out this morning.  All new dressings were reapplied.  Swelling has improved.  There was no significant purulence.   Incision: dressing C/D/I   Assessment: 1 Day Post-Op  S/P Procedure(s) (LRB): IRRIGATION AND DEBRIDEMENT HAND (Right) by Dr. Ernesta Amble. Percell Miller on 04/23/2017  Active Problems:   Essential hypertension   GERD   Dementia   Urinary incontinence   Depression, major, single episode, moderate (HCC)   Chronic insomnia   Seizure (HCC)   Cellulitis of hand, right   Cellulitis   Cellulitis of  hand   Right hand cellulitis, status post I&D Doing well postop day 1. Pain has improved. Continues to deny fever or systemic symptoms.   Plan:  Elevate extremity.    Smoking cessation.  Recommend transition to p.o. Antibiotics when able-cultures pending  Maintain current dressings and depending on discharge day, will plan for follow-up in the office either this Friday or next Monday morning.  Weightbearing: NWB RUE Insicional and dressing care: Dressings left intact until follow-up Showering: Keep dressing dry Contact information:  Edmonia Lynch MD, Roxan Hockey PA-C   Charna Elizabeth [place] III, PA-C 04/24/2017, 12:48 PM

## 2017-04-25 DIAGNOSIS — L0291 Cutaneous abscess, unspecified: Secondary | ICD-10-CM

## 2017-04-25 DIAGNOSIS — Z23 Encounter for immunization: Secondary | ICD-10-CM | POA: Diagnosis not present

## 2017-04-25 LAB — BASIC METABOLIC PANEL
Anion gap: 7 (ref 5–15)
BUN: 19 mg/dL (ref 6–20)
CHLORIDE: 110 mmol/L (ref 101–111)
CO2: 24 mmol/L (ref 22–32)
Calcium: 8.3 mg/dL — ABNORMAL LOW (ref 8.9–10.3)
Creatinine, Ser: 0.95 mg/dL (ref 0.61–1.24)
GFR calc Af Amer: >60 mL/min (ref >60)
GFR calc non Af Amer: >60 mL/min (ref >60)
Glucose, Bld: 89 mg/dL (ref 65–99)
POTASSIUM: 3.9 mmol/L (ref 3.5–5.1)
SODIUM: 141 mmol/L (ref 135–145)

## 2017-04-25 LAB — CBC
HEMATOCRIT: 31.2 % — AB (ref 39.0–52.0)
Hemoglobin: 10.1 g/dL — ABNORMAL LOW (ref 13.0–17.0)
MCH: 29.9 pg (ref 26.0–34.0)
MCHC: 32.4 g/dL (ref 30.0–36.0)
MCV: 92.3 fL (ref 78.0–100.0)
Platelets: 188 10*3/uL (ref 150–400)
RBC: 3.38 MIL/uL — ABNORMAL LOW (ref 4.22–5.81)
RDW: 14.1 % (ref 11.5–15.5)
WBC: 7.9 10*3/uL (ref 4.0–10.5)

## 2017-04-25 MED ORDER — AMOXICILLIN-POT CLAVULANATE 500-125 MG PO TABS: 1.0000 | ORAL_TABLET | Freq: Three times a day (TID) | ORAL | 0 refills | Status: DC

## 2017-04-25 MED ORDER — SACCHAROMYCES BOULARDII 250 MG PO CAPS: 250.0000 mg | ORAL_CAPSULE | Freq: Two times a day (BID) | ORAL | 0 refills | Status: DC

## 2017-04-25 MED ORDER — DOXYCYCLINE HYCLATE 50 MG PO CAPS: 100.0000 mg | ORAL_CAPSULE | Freq: Two times a day (BID) | ORAL | 0 refills | Status: DC

## 2017-04-25 NOTE — Progress Notes (Signed)
IV has been taken out and teaching has been done. Patient's daughter can not get here till around 2 pm.

## 2017-04-25 NOTE — Progress Notes (Signed)
[name] to be D/C'd Home per MD order.  Discussed with the patient and all questions fully answered.  VSS, Skin clean, dry and intact without evidence of skin break down, no evidence of skin tears noted. IV catheter discontinued intact. Site without signs and symptoms of complications. Dressing and pressure applied.  An After Visit Summary was printed and given to the patient. Patient received prescription.  D/c education completed with patient/family including follow up instructions, medication list, d/c activities limitations if indicated, with other d/c instructions as indicated by MD - patient able to verbalize understanding, all questions fully answered.   Patient instructed to return to ED, call 911, or call MD for any changes in condition.   Patient escorted via [place], and D/C home via private auto.  Holley Raring 04/25/2017 12:00 PM

## 2017-04-25 NOTE — Discharge Summary (Addendum)
Physician Discharge Summary  [name] ZDG:[id number] DOB: 1934-03-24 DOA: 04/22/2017  PCP: Alycia Rossetti, MD  Admit date: 04/22/2017 Discharge date: 04/25/2017  Admitted From home Disposition: Home Recommendations for Outpatient Follow-up:  1. Follow up with PCP in 1-2 weeks 2. Please obtain BMP/CBC in one week 3. Follow-up wound culture 4. Follow-up with Ortho 04/27/2017.  Home Health: None Equipment/Devices: None Discharge Condition stable CODE STATUS: DNR Diet recommendation: Cardiac Brief/Interim Summary:83 y.o. male with a history of dementia, seizures, patient presented to the ED secondary to right hand swelling.  He is found to have significant erythema concerning for cellulitis in addition to purulent drainage.  Orthopedic surgery consulted and plans for I&D.  Patient started on vancomycin IV with improvement of cellulitis.     Discharge Diagnoses:  Active Problems:   Essential hypertension   GERD   Dementia   Urinary incontinence   Depression, major, single episode, moderate (HCC)   Chronic insomnia   Seizure (HCC)   Cellulitis of hand, right   Cellulitis   Cellulitis of hand  1] right hand abscess status post incision and drainage and debridement 422 patient has been treated with vancomycin during his hospital stay.  He will be discharged on Augmentin 500 mg 3 times a day for 7 days.  Final wound culture is pending.  Blood cultures are pending.  He will follow-up with Ortho 04/27/2017.  Essential hypertension Slightly hypertensive. -Continue lisinopril  Seizure disorder Stable -Continue Keppra  GERD -Continue PPI (protonix while inpatient)  Dementia -Continue Aricept  Depression Insomnia -Continue Remeron, lorazepam, Celexa  Urinary incontinence -Continue Vesicare  Chronic pain -Continue home Norco as needed     Discharge Instructions  Discharge Instructions    Call MD for:  persistant nausea and vomiting   Complete by:  As  directed    Call MD for:  redness, tenderness, or signs of infection (pain, swelling, redness, odor or green/yellow discharge around incision site)   Complete by:  As directed    Call MD for:  severe uncontrolled pain   Complete by:  As directed    Call MD for:  temperature >100.4   Complete by:  As directed    Diet - low sodium heart healthy   Complete by:  As directed    Increase activity slowly   Complete by:  As directed      Allergies as of 04/25/2017      Reactions   Aspirin Nausea And Vomiting   Tolerates 81mg  daily      Medication List    STOP taking these medications   DAILY MULTIPLE VITAMINS tablet     TAKE these medications   albuterol 108 (90 Base) MCG/ACT inhaler Commonly known as:  PROVENTIL HFA;VENTOLIN HFA Inhale 2 puffs into the lungs every 4 (four) hours as needed for wheezing or shortness of breath.   amoxicillin-clavulanate 500-125 MG tablet Commonly known as:  AUGMENTIN Take 1 tablet (500 mg total) by mouth 3 (three) times daily.   aspirin EC 81 MG tablet Take 162 mg by mouth at bedtime.   citalopram 20 MG tablet Commonly known as:  CELEXA TAKE 1 TABLET BY MOUTH ONCE A DAY FOR DEPRESSION.   donepezil 10 MG tablet Commonly known as:  ARICEPT TAKE (1) TABLET BY MOUTH AT BEDTIME.   fluticasone 50 MCG/ACT nasal spray Commonly known as:  FLONASE USE 1 OR 2 SPRAYS IN EACH NOSTRIL EVERY DAY FOR NASAL ALLERGIES.   HYDROcodone-acetaminophen 5-325 MG tablet Commonly known as:  NORCO/VICODIN  TAKE 1 TABLET BY MOUTH THREE TIMES DAILY AS NEEDED FOR MODERATE PAIN   levETIRAcetam 250 MG tablet Commonly known as:  KEPPRA TAKE (1) TABLET BY MOUTH EACH MORNING.   lisinopril 10 MG tablet Commonly known as:  PRINIVIL,ZESTRIL Take 10 mg by mouth daily.   LORazepam 1 MG tablet Commonly known as:  ATIVAN TAKE (1) TABLET BY MOUTH AT BEDTIME.   lubiprostone 24 MCG capsule Commonly known as:  AMITIZA Take 1 capsule (24 mcg total) by mouth daily with  breakfast. MAY TAKE ADDITIONAL DOSE QD PRN.   mirtazapine 15 MG tablet Commonly known as:  REMERON TAKE 1/2 TABLET(7.5MG ) BY MOUTH AT BEDTIME FOR APPETITE.   multivitamin with minerals Tabs tablet Take 1 tablet by mouth daily.   naproxen sodium 220 MG tablet Commonly known as:  ALEVE Take 440 mg by mouth daily as needed.   nitroGLYCERIN 0.4 MG SL tablet Commonly known as:  NITROSTAT Place 0.4 mg under the tongue every 5 (five) minutes as needed for chest pain.   omeprazole 20 MG capsule Commonly known as:  PRILOSEC TAKE 1 CAPSULE BY MOUTH ONCE DAILY FOR RELFUX ESOPHAGITIS OR STOMACH ULCERS.   saccharomyces boulardii 250 MG capsule Commonly known as:  FLORASTOR Take 1 capsule (250 mg total) by mouth 2 (two) times daily.   traZODone 100 MG tablet Commonly known as:  DESYREL TAKE (1) TABLET BY MOUTH AT BEDTIME.   VESICARE 5 MG tablet Generic drug:  solifenacin TAKE ONE TABLET ONCE DAILY FOR BLADDER.   Vitamin D-3 1000 units Caps TAKE 1 CAPSULE BY MOUTH ONCE DAILY.      Follow-up Information    [name], MD Follow up on 04/27/2017.   Specialty:  Orthopedic Surgery Why:  Dressing change in the office Friday.  Call for appointment, but plan for 945 am. Contact information: [address] [place] [place] [address] [phone]          Allergies  Allergen Reactions  . Aspirin Nausea And Vomiting    Tolerates 81mg  daily    Consultations: Ortho  Procedures/Studies: Dg Hand Complete Right  Result Date: 04/22/2017 CLINICAL DATA:  Large draining right hand abscess at the 4th MCP joint. EXAM: RIGHT HAND - COMPLETE 3+ VIEW COMPARISON:  None. FINDINGS: Dorsal soft tissue swelling, most pronounced at the level of the MCP joints. No soft tissue gas, bone destruction or periosteal reaction. No radiopaque foreign body. IMPRESSION: Dorsal soft tissue swelling without underlying bony abnormality. Electronically Signed   By: Claudie Revering M.D.   On:  04/22/2017 15:18    (Echo, Carotid, EGD, Colonoscopy, ERCP)    Subjective:   Discharge Exam: Vitals:   04/24/17 2142 04/25/17 0603  BP: (!) 142/69 (!) 165/79  Pulse: 62 70  Resp: 18 18  Temp: 98.2 F (36.8 C) 97.7 F (36.5 C)  SpO2: 95% 96%   Vitals:   04/24/17 0845 04/24/17 1301 04/24/17 2142 04/25/17 0603  BP: (!) 145/65 (!) 144/70 (!) 142/69 (!) 165/79  Pulse:  (!) 54 62 70  Resp:  18 18 18   Temp:  97.9 F (36.6 C) 98.2 F (36.8 C) 97.7 F (36.5 C)  TempSrc:  Oral Oral Oral  SpO2:  97% 95% 96%  Weight:      Height:        General: Pt is alert, awake, not in acute distress Cardiovascular: RRR, S1/S2 +, no rubs, no gallops Respiratory: CTA bilaterally, no wheezing, no rhonchi Abdominal: Soft, NT, ND, bowel sounds + Extremities:  Right hand forearm with clean dressing and Ace bandage.   The results of significant diagnostics from this hospitalization (including imaging, microbiology, ancillary and laboratory) are listed below for reference.     Microbiology: Recent Results (from the past 240 hour(s))  Blood culture (routine x 2)     Status: None (Preliminary result)   Collection Time: 04/22/17  2:49 PM  Result Value Ref Range Status   Specimen Description LEFT ANTECUBITAL  Final   Special Requests   Final    BOTTLES DRAWN AEROBIC AND ANAEROBIC Blood Culture adequate volume   Culture   Final    NO GROWTH 3 DAYS Performed at [facility], [address] [place], [place] [address]    Report Status PENDING  Incomplete  Blood culture (routine x 2)     Status: None (Preliminary result)   Collection Time: 04/22/17  2:49 PM  Result Value Ref Range Status   Specimen Description LEFT ANTECUBITAL  Final   Special Requests   Final    BOTTLES DRAWN AEROBIC AND ANAEROBIC Blood Culture adequate volume   Culture   Final    NO GROWTH 3 DAYS Performed at [facility], [address] [place], [place] [address]    Report Status PENDING  Incomplete  Aerobic/Anaerobic  Culture (surgical/deep wound)     Status: None (Preliminary result)   Collection Time: 04/23/17  2:22 PM  Result Value Ref Range Status   Specimen Description TISSUE RIGHT HAND  Final   Special Requests NONE  Final   Gram Stain   Final    ABUNDANT WBC PRESENT,BOTH PMN AND MONONUCLEAR RARE GRAM POSITIVE COCCI    Culture   Final    FEW STAPHYLOCOCCUS AUREUS SUSCEPTIBILITIES TO FOLLOW Performed at [facility], [place] [address] [place],  [address]    Report Status PENDING  Incomplete     Labs: BNP (last 3 results) No results for input(s): BNP in the last 8760 hours. Basic Metabolic Panel: Recent Labs  Lab 04/22/17 1449 04/22/17 2256 04/23/17 0509 04/25/17 0316  NA 139  --  139 141  K 3.4*  --  3.3* 3.9  CL 104  --  108 110  CO2 23  --  24 24  GLUCOSE 124*  --  99 89  BUN 20  --  13 19  CREATININE 1.17 1.09 0.99 0.95  CALCIUM 8.9  --  8.1* 8.3*   Liver Function Tests: Recent Labs  Lab 04/22/17 1449  AST 22  ALT 12*  ALKPHOS 87  BILITOT 0.8  PROT 7.6  ALBUMIN 3.2*   No results for input(s): LIPASE, AMYLASE in the last 168 hours. No results for input(s): AMMONIA in the last 168 hours. CBC: Recent Labs  Lab 04/22/17 1449 04/22/17 2256 04/23/17 0509 04/25/17 0316  WBC 9.7 9.0 6.8 7.9  NEUTROABS 5.8  --   --   --   HGB 12.2* 9.3* 10.6* 10.1*  HCT 37.2* 30.0* 32.5* 31.2*  MCV 93.7 96.5 92.6 92.3  PLT 173 198 167 188   Cardiac Enzymes: No results for input(s): CKTOTAL, CKMB, CKMBINDEX, TROPONINI in the last 168 hours. BNP: Invalid input(s): POCBNP CBG: No results for input(s): GLUCAP in the last 168 hours. D-Dimer No results for input(s): DDIMER in the last 72 hours. Hgb A1c Recent Labs    04/23/17 0509  HGBA1C 5.4   Lipid Profile No results for input(s): CHOL, HDL, LDLCALC, TRIG, CHOLHDL, LDLDIRECT in the last 72 hours. Thyroid function studies No results for  input(s): TSH, T4TOTAL, T3FREE, THYROIDAB in the last 72 hours.  Invalid  input(s): FREET3 Anemia work up No results for input(s): VITAMINB12, FOLATE, FERRITIN, TIBC, IRON, RETICCTPCT in the last 72 hours. Urinalysis    Component Value Date/Time   COLORURINE YELLOW 03/03/2017 1827   APPEARANCEUR CLEAR 03/03/2017 1827   LABSPEC 1.012 03/03/2017 1827   PHURINE 7.0 03/03/2017 1827   GLUCOSEU NEGATIVE 03/03/2017 1827   HGBUR NEGATIVE 03/03/2017 1827   BILIRUBINUR NEGATIVE 03/03/2017 1827   KETONESUR NEGATIVE 03/03/2017 1827   PROTEINUR NEGATIVE 03/03/2017 1827   UROBILINOGEN 0.2 04/26/2014 0930   NITRITE NEGATIVE 03/03/2017 1827   LEUKOCYTESUR NEGATIVE 03/03/2017 1827   Sepsis Labs Invalid input(s): PROCALCITONIN,  WBC,  LACTICIDVEN Microbiology Recent Results (from the past 240 hour(s))  Blood culture (routine x 2)     Status: None (Preliminary result)   Collection Time: 04/22/17  2:49 PM  Result Value Ref Range Status   Specimen Description LEFT ANTECUBITAL  Final   Special Requests   Final    BOTTLES DRAWN AEROBIC AND ANAEROBIC Blood Culture adequate volume   Culture   Final    NO GROWTH 3 DAYS Performed at [facility], [address], [place], [place] [address]    Report Status PENDING  Incomplete  Blood culture (routine x 2)     Status: None (Preliminary result)   Collection Time: 04/22/17  2:49 PM  Result Value Ref Range Status   Specimen Description LEFT ANTECUBITAL  Final   Special Requests   Final    BOTTLES DRAWN AEROBIC AND ANAEROBIC Blood Culture adequate volume   Culture   Final    NO GROWTH 3 DAYS Performed at [facility], [address], [place], [place] [address]    Report Status PENDING  Incomplete  Aerobic/Anaerobic Culture (surgical/deep wound)     Status: None (Preliminary result)   Collection Time: 04/23/17  2:22 PM  Result Value Ref Range Status   Specimen Description TISSUE RIGHT HAND  Final   Special Requests NONE  Final   Gram Stain   Final    ABUNDANT WBC PRESENT,BOTH PMN AND MONONUCLEAR RARE GRAM POSITIVE COCCI     Culture   Final    FEW STAPHYLOCOCCUS AUREUS SUSCEPTIBILITIES TO FOLLOW Performed at [facility], [place] [address] [place],  [address]    Report Status PENDING  Incomplete     Time coordinating discharge: 32 min SIGNED:   Georgette Shell, MD  [facility] 04/25/2017, 9:37 AM Pager   If 7PM-7AM, please contact night-coverage [email or web address] Password [email or web address]

## 2017-04-25 NOTE — Progress Notes (Signed)
    Subjective: Patient reports pain as mild, continues to improve.    Objective:   VITALS:   Vitals:   04/24/17 0845 04/24/17 1301 04/24/17 2142 04/25/17 0603  BP: (!) 145/65 (!) 144/70 (!) 142/69 (!) 165/79  Pulse:  (!) 54 62 70  Resp:  18 18 18   Temp:  25.0 F (36.6 C) 98.2 F (36.8 C) 97.7 F (36.5 C)  TempSrc:  Oral Oral Oral  SpO2:  97% 95% 96%  Weight:      Height:       CBC Latest Ref Rng & Units 04/25/2017 04/23/2017 04/22/2017  WBC 4.0 - 10.5 K/uL 7.9 6.8 9.0  Hemoglobin 13.0 - 17.0 g/dL 10.1(L) 10.6(L) 9.3(L)  Hematocrit 39.0 - 52.0 % 31.2(L) 32.5(L) 30.0(L)  Platelets 150 - 400 K/uL 188 167 198   BMP Latest Ref Rng & Units 04/25/2017 04/23/2017 04/22/2017  Glucose 65 - 99 mg/dL 89 99 -  BUN 6 - 20 mg/dL 19 13 -  Creatinine 0.61 - 1.24 mg/dL 0.95 0.99 1.09  BUN/Creat Ratio 6 - 22 (calc) - - -  Sodium 135 - 145 mmol/L 141 139 -  Potassium 3.5 - 5.1 mmol/L 3.9 3.3(L) -  Chloride 101 - 111 mmol/L 110 108 -  CO2 22 - 32 mmol/L 24 24 -  Calcium 8.9 - 10.3 mg/dL 8.3(L) 8.1(L) -   Intake/Output      04/23 0701 - 04/24 0700   P.O. 360   Total Intake(mL/kg) 360 (4.8)   Urine (mL/kg/hr) 100 (0.1)   Stool 0   Total Output 100   Net +260       Stool Occurrence 1 x      Physical Exam: General: NAD.  Supine in bed.  Calm, conversant.  MSK RUE: Hand warm.   Incision: dressing C/D/I Ace wrap re-applied   Assessment: 2 Days Post-Op  S/P Procedure(s) (LRB): IRRIGATION AND DEBRIDEMENT HAND (Right) by Dr. Ernesta Amble. Percell Miller on 04/23/2017  Active Problems:   Essential hypertension   GERD   Dementia   Urinary incontinence   Depression, major, single episode, moderate (HCC)   Chronic insomnia   Seizure (HCC)   Cellulitis of hand, right   Cellulitis   Cellulitis of hand   Right hand cellulitis, status post I&D Doing well postop day 2. Stable from an orthopedic perspective. Pain continues to improve. Continues to deny fever or systemic  symptoms.   Plan:  Elevate extremity.    Smoking cessation.  Recommend transition to p.o. Antibiotics when able - sensitivities pending  Maintain current dressings and depending on discharge day, will plan for follow-up in the office either Friday A.M. or next Monday morning.  This was discussed w/ the patient.  Weightbearing: NWB RUE Insicional and dressing care: Dressings left intact until follow-up Contact information:  Edmonia Lynch MD, Roxan Hockey PA-C.  Please call with questions.   Charna Elizabeth Martensen III, PA-C 04/25/2017, 6:27 AM

## 2017-04-27 LAB — CULTURE, BLOOD (ROUTINE X 2)
CULTURE: NO GROWTH
CULTURE: NO GROWTH
SPECIAL REQUESTS: ADEQUATE
Special Requests: ADEQUATE

## 2017-04-28 LAB — AEROBIC/ANAEROBIC CULTURE (SURGICAL/DEEP WOUND)

## 2017-04-28 LAB — AEROBIC/ANAEROBIC CULTURE W GRAM STAIN (SURGICAL/DEEP WOUND)

## 2017-04-30 DIAGNOSIS — S60511D Abrasion of right hand, subsequent encounter: Secondary | ICD-10-CM | POA: Diagnosis not present

## 2017-05-07 DIAGNOSIS — S60511D Abrasion of right hand, subsequent encounter: Secondary | ICD-10-CM | POA: Diagnosis not present

## 2017-05-14 ENCOUNTER — Other Ambulatory Visit: Payer: Self-pay | Admitting: Family Medicine

## 2017-05-14 DIAGNOSIS — S60511D Abrasion of right hand, subsequent encounter: Secondary | ICD-10-CM | POA: Diagnosis not present

## 2017-05-15 ENCOUNTER — Other Ambulatory Visit: Payer: Self-pay | Admitting: Family Medicine

## 2017-05-15 NOTE — Telephone Encounter (Signed)
Ok to refill Ativan??  Last office visit 02/27/2017.  Last refill 11/21/2016, #1 refill.

## 2017-05-24 DIAGNOSIS — I10 Essential (primary) hypertension: Secondary | ICD-10-CM | POA: Diagnosis not present

## 2017-05-24 DIAGNOSIS — J449 Chronic obstructive pulmonary disease, unspecified: Secondary | ICD-10-CM | POA: Diagnosis not present

## 2017-05-24 DIAGNOSIS — M4800 Spinal stenosis, site unspecified: Secondary | ICD-10-CM | POA: Diagnosis not present

## 2017-05-30 DIAGNOSIS — S60511D Abrasion of right hand, subsequent encounter: Secondary | ICD-10-CM | POA: Diagnosis not present

## 2017-06-04 ENCOUNTER — Other Ambulatory Visit: Payer: Self-pay

## 2017-06-04 ENCOUNTER — Ambulatory Visit (INDEPENDENT_AMBULATORY_CARE_PROVIDER_SITE_OTHER): Payer: Medicare Other | Admitting: Family Medicine

## 2017-06-04 ENCOUNTER — Encounter: Payer: Self-pay | Admitting: Family Medicine

## 2017-06-04 VITALS — BP 154/80 | HR 74 | Temp 98.3°F | Resp 16

## 2017-06-04 DIAGNOSIS — B372 Candidiasis of skin and nail: Secondary | ICD-10-CM | POA: Diagnosis not present

## 2017-06-04 DIAGNOSIS — K5900 Constipation, unspecified: Secondary | ICD-10-CM | POA: Diagnosis not present

## 2017-06-04 DIAGNOSIS — G894 Chronic pain syndrome: Secondary | ICD-10-CM

## 2017-06-04 DIAGNOSIS — I10 Essential (primary) hypertension: Secondary | ICD-10-CM | POA: Diagnosis not present

## 2017-06-04 DIAGNOSIS — Z8673 Personal history of transient ischemic attack (TIA), and cerebral infarction without residual deficits: Secondary | ICD-10-CM | POA: Diagnosis not present

## 2017-06-04 MED ORDER — NYSTATIN 100000 UNIT/GM EX POWD: Freq: Four times a day (QID) | CUTANEOUS | 3 refills | Status: DC

## 2017-06-04 MED ORDER — FLUCONAZOLE 100 MG PO TABS: ORAL_TABLET | ORAL | 0 refills | Status: DC

## 2017-06-04 MED ORDER — NYSTATIN 100000 UNIT/GM EX CREA: 1.0000 "application " | TOPICAL_CREAM | Freq: Two times a day (BID) | CUTANEOUS | 3 refills | Status: DC

## 2017-06-04 NOTE — Assessment & Plan Note (Signed)
Continue norco, difficult to control his pain in general Current pain due to severe yeast infection, treat with oral diflucan 100mg  daily x 3 days and topical nystatin, once clear, can use nystatin powder

## 2017-06-04 NOTE — Patient Instructions (Addendum)
Use nystatin cream  Take the diflucan  Use nystatin powder  F/U 3 WEEKS

## 2017-06-04 NOTE — Progress Notes (Signed)
   Subjective:    Patient ID: [name], male    DOB: [birth year], 83 y.o.   MRN: [id number]  Patient presents for Follow-up (is fasting)   Pt here f/u medical problems.   Had abscess of right hand, had surgical I &D in OR in April. Was seen by Dr. Maretta Los last Friday has been released. He has completed antibiotics    Still has ratteling in chest, has some production- clear white  Has some allergies- using flonase   No fevers     Has rash in groin region for past 3-4 weeks. Has itching and pain. States pain meds have not helped   He has chronic pain in legs, does not bear weight, transfers to bed/chair/commode   Has chronic constipation- some days takes 2 of the amitiza    Review Of Systems:  GEN- denies fatigue, fever, weight loss,weakness, recent illness HEENT- denies eye drainage, change in vision, nasal discharge, CVS- denies chest pain, palpitations RESP- denies SOB, cough, wheeze ABD- denies N/V, change in stools, abd pain GU- denies dysuria, hematuria, dribbling, incontinence MSK- + joint pain, muscle aches, injury Neuro- denies headache, dizziness, syncope, seizure activity       Objective:    BP (!) 154/80   Pulse 74   Temp 98.3 F (36.8 C) (Oral)   Resp 16   SpO2 97%  GEN- NAD, alert and oriented x3,sitting in wheelchair  HEENT- PERRL, EOMI, non injected sclera, pink conjunctiva, MMM, oropharynx clear Neck- Supple, no thyromegaly CVS- RRR, no murmur RESP-CTAB ABD-NABS,soft,NT,ND Skin- erythema with maceration in groin, caked powder, erythema with raw skin over scrotum extending to perineum EXT- No edema Pulses- Radial 2+        Assessment & Plan:      Problem List Items Addressed This Visit      Unprioritized   History of stroke   Essential hypertension    Bp is a little elevated Recheck in 3 weeks  Reassurance given for respiratory exam, continue allergy meds      Constipation    Use amitiza BID      Chronic pain    Continue norco,  difficult to control his pain in general Current pain due to severe yeast infection, treat with oral diflucan 100mg  daily x 3 days and topical nystatin, once clear, can use nystatin powder        Other Visit Diagnoses    Skin yeast infection    -  Primary   recheck in 3 weeks   Relevant Medications   nystatin (MYCOSTATIN/NYSTOP) powder   fluconazole (DIFLUCAN) 100 MG tablet   nystatin cream (MYCOSTATIN)      Note: This dictation was prepared with Dragon dictation along with smaller phrase technology. Any transcriptional errors that result from this process are unintentional.

## 2017-06-04 NOTE — Assessment & Plan Note (Addendum)
Bp is a little elevated Recheck in 3 weeks  Reassurance given for respiratory exam, continue allergy meds

## 2017-06-04 NOTE — Assessment & Plan Note (Signed)
Use amitiza BID

## 2017-06-13 DIAGNOSIS — L110 Acquired keratosis follicularis: Secondary | ICD-10-CM | POA: Diagnosis not present

## 2017-06-13 DIAGNOSIS — I739 Peripheral vascular disease, unspecified: Secondary | ICD-10-CM | POA: Diagnosis not present

## 2017-06-13 DIAGNOSIS — B351 Tinea unguium: Secondary | ICD-10-CM | POA: Diagnosis not present

## 2017-06-18 ENCOUNTER — Other Ambulatory Visit: Payer: Self-pay | Admitting: Family Medicine

## 2017-06-18 DIAGNOSIS — S60511D Abrasion of right hand, subsequent encounter: Secondary | ICD-10-CM | POA: Diagnosis not present

## 2017-06-19 NOTE — Telephone Encounter (Signed)
Ok to refill Hydrocodone/APAP??  Last office visit 06/04/2017.  Last refill 04/17/2017.

## 2017-06-25 ENCOUNTER — Ambulatory Visit: Payer: Medicare Other | Admitting: Family Medicine

## 2017-06-26 ENCOUNTER — Ambulatory Visit: Payer: Medicare Other | Admitting: Family Medicine

## 2017-06-27 DIAGNOSIS — S60511D Abrasion of right hand, subsequent encounter: Secondary | ICD-10-CM | POA: Diagnosis not present

## 2017-07-03 ENCOUNTER — Encounter: Payer: Self-pay | Admitting: Family Medicine

## 2017-07-03 ENCOUNTER — Ambulatory Visit (INDEPENDENT_AMBULATORY_CARE_PROVIDER_SITE_OTHER): Payer: Medicare Other | Admitting: Family Medicine

## 2017-07-03 ENCOUNTER — Other Ambulatory Visit: Payer: Self-pay

## 2017-07-03 VITALS — BP 142/92 | HR 82 | Temp 97.8°F | Resp 16

## 2017-07-03 DIAGNOSIS — B372 Candidiasis of skin and nail: Secondary | ICD-10-CM | POA: Diagnosis not present

## 2017-07-03 DIAGNOSIS — K5900 Constipation, unspecified: Secondary | ICD-10-CM | POA: Diagnosis not present

## 2017-07-03 DIAGNOSIS — E440 Moderate protein-calorie malnutrition: Secondary | ICD-10-CM | POA: Diagnosis not present

## 2017-07-03 NOTE — Patient Instructions (Signed)
F/U 3 months Leisa or MBD

## 2017-07-03 NOTE — Assessment & Plan Note (Addendum)
The importance of him eating in the morning.  He will start with breakfast bars and fruit bars toast and eggs something to get on his stomach.  He will continue the interim between.  Not can change his Remeron because of his other medications and interactions Unable to get an actual weight on him because he is wheelchair-bound.

## 2017-07-03 NOTE — Assessment & Plan Note (Signed)
Current dose of amitiza, bowels improved

## 2017-07-03 NOTE — Progress Notes (Signed)
   Subjective:    Patient ID: [name], male    DOB: [birth year], 83 y.o.   MRN: [id number]  Patient presents for Follow-up (is fasting)   Pt here for intermin visit  Has 1 more appt with Dr. Percell Miller, still has swelling in the hand where he had surgical intervention for his infection   Constipation- Taking 2 of the amitaza for constipation, bowels much better    Yeast infection on skin comleted diflucan/nytatin cream, rash now resolved  States appetite still isnt good, often only drinks coffee for breakfast, then eats again at 11am. Has some snacks.  He is taking Remeron he is also on Ensure.  Feels good today.  Review Of Systems:  GEN- denies fatigue, fever, weight loss,weakness, recent illness HEENT- denies eye drainage, change in vision, nasal discharge, CVS- denies chest pain, palpitations RESP- denies SOB, cough, wheeze ABD- denies N/V, change in stools, abd pain GU- denies dysuria, hematuria, dribbling, incontinence MSK-+ joint pain, muscle aches, injury Neuro- denies headache, dizziness, syncope, seizure activity       Objective:    BP (!) 142/92   Pulse 82   Temp 97.8 F (36.6 C) (Oral)   Resp 16   SpO2 97%  GEN- NAD, alert and oriented x3, exam and while in wheelchair HEENT- PERRL, EOMI, non injected sclera, pink conjunctiva, MMM, oropharynx clear CVS- RRR, no murmur RESP-CTAB ABD-NABS,soft,NT,ND Skin in tact, no rash  EXT- No edema Pulses- Radial 2+        Assessment & Plan:      Problem List Items Addressed This Visit      Unprioritized   Constipation    Current dose of amitiza, bowels improved       Protein-calorie malnutrition ([place]) - Primary    The importance of him eating in the morning.  He will start with breakfast bars and fruit bars toast and eggs something to get on his stomach.  He will continue the interim between.  Not can change his Remeron because of his other medications and interactions Unable to get an actual weight on him  because he is wheelchair-bound.       Other Visit Diagnoses    Yeast infection of the skin       Skin is completely clear.  We will continue to use nystatin or [place] powder to keep the moisture down in the groin creases      Note: This dictation was prepared with Dragon dictation along with smaller phrase technology. Any transcriptional errors that result from this process are unintentional.

## 2017-07-19 ENCOUNTER — Other Ambulatory Visit: Payer: Self-pay | Admitting: Family Medicine

## 2017-07-19 DIAGNOSIS — H401134 Primary open-angle glaucoma, bilateral, indeterminate stage: Secondary | ICD-10-CM | POA: Diagnosis not present

## 2017-07-19 DIAGNOSIS — H04123 Dry eye syndrome of bilateral lacrimal glands: Secondary | ICD-10-CM | POA: Diagnosis not present

## 2017-07-19 DIAGNOSIS — H2702 Aphakia, left eye: Secondary | ICD-10-CM | POA: Diagnosis not present

## 2017-07-19 DIAGNOSIS — H2511 Age-related nuclear cataract, right eye: Secondary | ICD-10-CM | POA: Diagnosis not present

## 2017-07-19 DIAGNOSIS — H25011 Cortical age-related cataract, right eye: Secondary | ICD-10-CM | POA: Diagnosis not present

## 2017-08-10 DIAGNOSIS — H47231 Glaucomatous optic atrophy, right eye: Secondary | ICD-10-CM | POA: Diagnosis not present

## 2017-08-10 DIAGNOSIS — H4720 Unspecified optic atrophy: Secondary | ICD-10-CM | POA: Diagnosis not present

## 2017-08-10 DIAGNOSIS — H18413 Arcus senilis, bilateral: Secondary | ICD-10-CM | POA: Diagnosis not present

## 2017-08-10 DIAGNOSIS — H401114 Primary open-angle glaucoma, right eye, indeterminate stage: Secondary | ICD-10-CM | POA: Diagnosis not present

## 2017-08-10 DIAGNOSIS — H348122 Central retinal vein occlusion, left eye, stable: Secondary | ICD-10-CM | POA: Diagnosis not present

## 2017-08-15 ENCOUNTER — Encounter: Payer: Self-pay | Admitting: Family Medicine

## 2017-08-15 ENCOUNTER — Ambulatory Visit (INDEPENDENT_AMBULATORY_CARE_PROVIDER_SITE_OTHER): Payer: Medicare Other | Admitting: Family Medicine

## 2017-08-15 VITALS — BP 152/78 | HR 82 | Temp 98.3°F | Resp 18

## 2017-08-15 DIAGNOSIS — R0602 Shortness of breath: Secondary | ICD-10-CM | POA: Diagnosis not present

## 2017-08-15 DIAGNOSIS — J449 Chronic obstructive pulmonary disease, unspecified: Secondary | ICD-10-CM | POA: Diagnosis not present

## 2017-08-15 DIAGNOSIS — R059 Cough, unspecified: Secondary | ICD-10-CM

## 2017-08-15 DIAGNOSIS — S60511D Abrasion of right hand, subsequent encounter: Secondary | ICD-10-CM | POA: Diagnosis not present

## 2017-08-15 DIAGNOSIS — R05 Cough: Secondary | ICD-10-CM

## 2017-08-15 MED ORDER — NEBULIZER/TUBING/MOUTHPIECE KIT: PACK | 0 refills | Status: DC

## 2017-08-15 MED ORDER — PREDNISONE 20 MG PO TABS: ORAL_TABLET | ORAL | 0 refills | Status: DC

## 2017-08-15 MED ORDER — HYDROCODONE-HOMATROPINE 5-1.5 MG/5ML PO SYRP: 5.0000 mL | ORAL_SOLUTION | Freq: Three times a day (TID) | ORAL | 0 refills | Status: DC | PRN

## 2017-08-15 MED ORDER — LEVOFLOXACIN 750 MG PO TABS: 750.0000 mg | ORAL_TABLET | Freq: Every day | ORAL | 0 refills | Status: AC

## 2017-08-15 MED ORDER — BENZONATATE 100 MG PO CAPS: 100.0000 mg | ORAL_CAPSULE | Freq: Three times a day (TID) | ORAL | 0 refills | Status: DC | PRN

## 2017-08-15 MED ORDER — ALBUTEROL SULFATE (2.5 MG/3ML) 0.083% IN NEBU: 2.5000 mg | INHALATION_SOLUTION | Freq: Four times a day (QID) | RESPIRATORY_TRACT | 1 refills | Status: DC | PRN

## 2017-08-15 MED ORDER — ALBUTEROL SULFATE HFA 108 (90 BASE) MCG/ACT IN AERS: 2.0000 | INHALATION_SPRAY | RESPIRATORY_TRACT | 0 refills | Status: DC | PRN

## 2017-08-15 NOTE — Progress Notes (Addendum)
Patient ID: [name], male    DOB: 29-Feb-1934, 82 y.o.   MRN: [id number]  PCP: Alycia Rossetti, MD  Chief Complaint  Patient presents with  . Runny nose, cough x 4 days    Subjective:   [name] is a 83 y.o. male, presents to clinic with CC of cough x 4 days, worsening, non-productive his abs are very sore from coughing.  Hx of multifocal pna a few months ago.  Several hospital visits this year, last was late April for abscess taken to OR and d/c with antibiotics.   His nasal sx have been severe with drainage and congestion for over a week leading to severe frequent coughing, with severe wheeze, feeling tired with decreased appetite.  Cough is worsening, dry, occurring with every time he tried to breath in deep.  He denies CP.  No confusion, no sleepiness per wife.  His abdomen and abs are very sore from the frequent violent coughing. Current smoker 1/2 pack x 60 years.  He has inhalers at home that he was given after hospitalization for PNA, but states they are hard to use and he can't use with current severe coughing with any deep breaths.  He denies N, V, D, rash, HA, near syncope, sweats, fever, weight change.  He is wheelchair bound so no exertional sx.  He had some mild hot/cold chills.     Patient Active Problem List   Diagnosis Date Noted  . Carotid artery disease ([place]) 03/26/2017  . Internal carotid artery stenosis, left 03/06/2017  . Dysphagia 03/06/2017  . Pulmonary nodules 03/06/2017  . Pneumonia 03/03/2017  . Seizure ([place]) 03/03/2017  . Protein-calorie malnutrition ([place]) 01/10/2017  . Chronic pain 10/02/2016  . Depression, major, single episode, moderate ([place]) 08/31/2016  . Chronic insomnia 08/31/2016  . Lumbar spinal stenosis 08/18/2016  . Wheelchair bound 08/18/2016  . Dementia 08/18/2016  . Urinary incontinence 08/18/2016  . Polypharmacy 08/18/2016  . Memory difficulty 08/04/2014  . Numbness and tingling of right arm 08/04/2014  . Abnormality of gait  08/01/2013  . Anemia 12/05/2012  . Early satiety 12/03/2012  . History of colon polyps 12/03/2012  . Facial droop 10/12/2012  . Spinal stenosis of cervical region 07/01/2012  . GERD 05/27/2009  . DYSPHAGIA UNSPECIFIED 05/27/2009  . CHANGE IN BOWELS 05/27/2009  . SMOKER 09/09/2008  . Essential hypertension 09/09/2008  . History of stroke 09/09/2008  . Constipation 09/09/2008  . HIGH BLOOD PRESSURE 07/25/2006     Prior to Admission medications   Medication Sig Start Date End Date Taking? Authorizing Provider  albuterol (PROVENTIL HFA;VENTOLIN HFA) 108 (90 Base) MCG/ACT inhaler Inhale 2 puffs into the lungs every 4 (four) hours as needed for wheezing or shortness of breath. 02/27/17  Yes [place], Modena Nunnery, MD  aspirin EC 81 MG tablet Take 162 mg by mouth at bedtime.    Yes [provider]  Cholecalciferol (VITAMIN D-3) 1000 units CAPS TAKE 1 CAPSULE BY MOUTH ONCE DAILY. 05/15/17  Yes [place], Modena Nunnery, MD  citalopram (CELEXA) 20 MG tablet TAKE 1 TABLET BY MOUTH ONCE A DAY FOR DEPRESSION. 02/22/17  Yes [place], Modena Nunnery, MD  donepezil (ARICEPT) 10 MG tablet TAKE (1) TABLET BY MOUTH AT BEDTIME. 05/20/15  Yes Kathrynn Ducking, MD  fluticasone (FLONASE) 50 MCG/ACT nasal spray USE 1 OR 2 SPRAYS IN EACH NOSTRIL EVERY DAY FOR NASAL ALLERGIES. 02/22/17  Yes [place], Modena Nunnery, MD  HYDROcodone-acetaminophen (NORCO/VICODIN) 5-325 MG tablet TAKE 1 TABLET BY MOUTH THREE TIMES  DAILY AS NEEDED FOR MODERATE PAIN 06/19/17  Yes [place], Modena Nunnery, MD  levETIRAcetam (KEPPRA) 250 MG tablet TAKE (1) TABLET BY MOUTH EACH MORNING. 07/19/17  Yes [place], Modena Nunnery, MD  lisinopril (PRINIVIL,ZESTRIL) 10 MG tablet TAKE 1 TABLET BY MOUTH ONCE A DAY FOR HIGH BLOOD PRESSURE 07/19/17  Yes [place], Modena Nunnery, MD  LORazepam (ATIVAN) 1 MG tablet TAKE (1) TABLET BY MOUTH AT BEDTIME. 11/21/16  Yes [place], Modena Nunnery, MD  LORazepam (ATIVAN) 1 MG tablet TAKE (1) TABLET BY MOUTH AT BEDTIME. 05/15/17  Yes [place], Modena Nunnery, MD    lubiprostone (AMITIZA) 24 MCG capsule Take 1 capsule (24 mcg total) by mouth daily with breakfast. MAY TAKE ADDITIONAL DOSE QD PRN. 01/04/17  Yes [place], Modena Nunnery, MD  mirtazapine (REMERON) 15 MG tablet TAKE 1/2 TABLET(7.'5MG'$ ) BY MOUTH AT BEDTIME FOR APPETITE. 07/19/17  Yes [place], Modena Nunnery, MD  naproxen sodium (ALEVE) 220 MG tablet Take 440 mg by mouth daily as needed.   Yes [provider]  nitroGLYCERIN (NITROSTAT) 0.4 MG SL tablet Place 0.4 mg under the tongue every 5 (five) minutes as needed for chest pain.   Yes [provider]  nystatin (MYCOSTATIN/NYSTOP) powder Apply topically 4 (four) times daily. As needed to affected areas 06/04/17  Yes [place], Modena Nunnery, MD  nystatin cream (MYCOSTATIN) Apply 1 application topically 2 (two) times daily. TO GROIN/Scrotum 06/04/17  Yes [place], Modena Nunnery, MD  omeprazole (PRILOSEC) 20 MG capsule TAKE 1 CAPSULE BY MOUTH ONCE DAILY FOR RELFUX ESOPHAGITIS OR STOMACH ULCERS. 07/19/17  Yes [place], Modena Nunnery, MD  saccharomyces boulardii (FLORASTOR) 250 MG capsule Take 1 capsule (250 mg total) by mouth 2 (two) times daily. 04/25/17  Yes Georgette Shell, MD  traZODone (DESYREL) 100 MG tablet TAKE (1) TABLET BY MOUTH AT BEDTIME. 05/14/17  Yes [place], Modena Nunnery, MD  VESICARE 5 MG tablet TAKE ONE TABLET ONCE DAILY FOR BLADDER. 07/19/17  Yes , Modena Nunnery, MD     Allergies  Allergen Reactions  . Aspirin Nausea And Vomiting    Tolerates '81mg'$  daily     Family History  Problem Relation Age of Onset  . Cancer Mother   . Dementia Father   . Colon cancer Neg Hx      Social History   Socioeconomic History  . Marital status: Married    Spouse name: Not on file  . Number of children: 2  . Years of education: hs  . Highest education level: Not on file  Occupational History  . Occupation: Retired    Fish farm manager: RETIRED  Social Needs  . Financial resource strain: Not on file  . Food insecurity:    Worry: Not on file    Inability: Not on  file  . Transportation needs:    Medical: Not on file    Non-medical: Not on file  Tobacco Use  . Smoking status: Current Some Day Smoker    Packs/day: 0.50    Years: 60.00    Pack years: 30.00    Types: Cigarettes  . Smokeless tobacco: Never Used  Substance and Sexual Activity  . Alcohol use: No  . Drug use: No  . Sexual activity: Not Currently  Lifestyle  . Physical activity:    Days per week: Not on file    Minutes per session: Not on file  . Stress: Not on file  Relationships  . Social connections:    Talks on phone: Not on file    Gets together: Not on file  Attends religious service: Not on file    Active member of club or organization: Not on file    Attends meetings of clubs or organizations: Not on file    Relationship status: Not on file  . Intimate partner violence:    Fear of current or ex partner: Not on file    Emotionally abused: Not on file    Physically abused: Not on file    Forced sexual activity: Not on file  Other Topics Concern  . Not on file  Social History Narrative   Patient drinks 1-2 cups of caffeine daily.   Patient is right handed.    10 Systems reviewed and are negative for acute change except as noted in the HPI.  Review of Systems  Constitutional: Negative.   HENT: Negative.   Eyes: Negative.   Respiratory: Negative.   Cardiovascular: Negative.   Gastrointestinal: Negative.   Endocrine: Negative.   Genitourinary: Negative.   Musculoskeletal: Negative.   Skin: Negative.   Allergic/Immunologic: Negative.   Neurological: Negative.   Hematological: Negative.   Psychiatric/Behavioral: Negative.   All other systems reviewed and are negative.      Objective:    Vitals:   08/15/17 1540  BP: (!) 152/78  Pulse: 82  Resp: 18  Temp: 98.3 F (36.8 C)  TempSrc: Oral  SpO2: 94%      Physical Exam  Constitutional: He is oriented to person, place, and time. He appears well-developed and well-nourished.  Non-toxic  appearance. He does not appear ill. No distress.  Elderly AAM, seated in wheelchair, non-toxic and mildly ill appearing, frequent coughing, NAD  HENT:  Head: Normocephalic and atraumatic.  Right Ear: Tympanic membrane, external ear and ear canal normal.  Left Ear: Tympanic membrane, external ear and ear canal normal.  Nose: Mucosal edema and rhinorrhea present.  Mouth/Throat: Uvula is midline. No trismus in the jaw. No uvula swelling. No oropharyngeal exudate, posterior oropharyngeal edema or posterior oropharyngeal erythema.  Copious nasal congestion  Eyes: Pupils are equal, round, and reactive to light. Conjunctivae, EOM and lids are normal.  Neck: Trachea normal, normal range of motion and phonation normal. Neck supple. No tracheal deviation present.  Cardiovascular: Regular rhythm and normal pulses. Exam reveals distant heart sounds. Exam reveals no gallop and no friction rub.  No murmur heard. Pulses:      Radial pulses are 2+ on the right side, and 2+ on the left side.  Pulmonary/Chest: Effort normal. No accessory muscle usage or stridor. No tachypnea. No respiratory distress. He has decreased breath sounds in the right lower field and the left lower field. He has wheezes (diffuse inspiratory and exp wheeze). He has rhonchi.  Scattered rhonchi to b/l middle to lower lobes, coarse crackles to b/l LL Frequent coughing with any deep inspiration At rest shallow respirations Able to speak in short sentences  Abdominal: Soft. Normal appearance and bowel sounds are normal. He exhibits no distension. There is no tenderness.  abd wall ttp, no rebound no guarding  Musculoskeletal: Normal range of motion. He exhibits no edema.  Neurological: He is alert and oriented to person, place, and time. Gait normal.  Skin: Skin is warm, dry and intact. Capillary refill takes less than 2 seconds. No rash noted. He is not diaphoretic. No cyanosis. No pallor. Nails show clubbing.  Psychiatric: He has a normal  mood and affect. His speech is normal and behavior is normal.  Nursing note and vitals reviewed.   Nebulizer tx administered and lungs rechecked, improved  BS to b/l middle lobes, continued diminished BS to b/l LL, wheeze improved, persistent crackles to R>L LL, scattered rhonchi minimally improved      Assessment & Plan:      ICD-10-CM   1. Cough R05 DG Chest 2 View    levofloxacin (LEVAQUIN) 750 MG tablet    predniSONE (DELTASONE) 20 MG tablet    albuterol (PROVENTIL) (2.5 MG/3ML) 0.083% nebulizer solution    albuterol (PROVENTIL HFA;VENTOLIN HFA) 108 (90 Base) MCG/ACT inhaler    Respiratory Therapy Supplies (NEBULIZER/TUBING/MOUTHPIECE) KIT   suspect CAP, with CAP vs acute exacerbation of COPD possible.  CAP earlier this year, no hospitalization in past 3 mo  2. Chronic obstructive pulmonary disease, unspecified COPD type (HCC) J44.9 predniSONE (DELTASONE) 20 MG tablet    albuterol (PROVENTIL) (2.5 MG/3ML) 0.083% nebulizer solution    albuterol (PROVENTIL HFA;VENTOLIN HFA) 108 (90 Base) MCG/ACT inhaler    Respiratory Therapy Supplies (NEBULIZER/TUBING/MOUTHPIECE) KIT    benzonatate (TESSALON) 100 MG capsule    HYDROcodone-homatropine (HYCODAN) 5-1.5 MG/5ML syrup  3. Shortness of breath R06.02 predniSONE (DELTASONE) 20 MG tablet    albuterol (PROVENTIL) (2.5 MG/3ML) 0.083% nebulizer solution    albuterol (PROVENTIL HFA;VENTOLIN HFA) 108 (90 Base) MCG/ACT inhaler    Respiratory Therapy Supplies (NEBULIZER/TUBING/MOUTHPIECE) KIT    Pt with rapidly worsening non-productive cough with wheeze and SOB secondary to URI sx and severe nasal discharge and congestion.  Hx of recent multifocal pneumonia.  Current smoker with probable COPD but pt denies dx.  Ddx CAP vs AECOPD.    VSS, BP mildly elevated, mild increased WOB but improved with neb, no tachycardia, no hypoxia, normal RR.  No concerning cardiac sx or findings on exam.  Breathing tx given in clinic with improved BS and persistent  crackles, CXR ordered.  He was supposed to get a f/up CXR after pneumonia in January and in March of this year, so lower threshold to image.  Starting abx, steroids, nebs, cough meds  Pt will not go to get CXR today, he is "too tired" but they will get it tomorrow per wife.  She was encouraged to start abx and steroids today with breathing txs or inhaler.    Attempting to obtain a nebulizer for him to treat because he cannot perform a good quality inspiration with severe coughing (suspect bronchospasms).  Rx for neb machine give with dx codes written on Rx, instructed to go to [facility] to get ASAP.    Pt will require frequent nebs for acute illness, and likely for chronic illnesses with neb tx with albuterol every 4 hours.  After improving from illness, he will likely need nebulized to do maintenance nebulizer treatments.    Cough meds PRN for severe cough, with cough syrup to use sparingly at night and monitor breathing.   I requested 2 day follow up, but I doubt pt will return for recheck.  Reviewed ER precautions as length with pt and his wife, who verbalized understanding.  ER if CP, SOB, confusion.    Recheck again in 1 week at end of abx.   Delsa Grana, PA-C 08/15/17 4:10 PM

## 2017-08-15 NOTE — Patient Instructions (Signed)
Need to get nebulizer machine at [facility] chest X-ray  Start steroids and antibiotics.  Follow up in 2 days (phone or office)  Office visit follow up in 1 week  Community-Acquired Pneumonia, Adult Pneumonia is an infection of the lungs. One type of pneumonia can happen while a person is in a hospital. A different type can happen when a person is not in a hospital (community-acquired pneumonia). It is easy for this kind to spread from person to person. It can spread to you if you breathe near an infected person who coughs or sneezes. Some symptoms include:  A dry cough.  A wet (productive) cough.  Fever.  Sweating.  Chest pain.  Follow these instructions at home:  Take over-the-counter and prescription medicines only as told by your doctor. ? Only take cough medicine if you are losing sleep. ? If you were prescribed an antibiotic medicine, take it as told by your doctor. Do not stop taking the antibiotic even if you start to feel better.  Sleep with your head and neck raised (elevated). You can do this by putting a few pillows under your head, or you can sleep in a recliner.  Do not use tobacco products. These include cigarettes, chewing tobacco, and e-cigarettes. If you need help quitting, ask your doctor.  Drink enough water to keep your pee (urine) clear or pale yellow. A shot (vaccine) can help prevent pneumonia. Shots are often suggested for:  People older than 81 years of age.  People older than 81 years of age: ? Who are having cancer treatment. ? Who have long-term (chronic) lung disease. ? Who have problems with their body's defense system (immune system).  You may also prevent pneumonia if you take these actions:  Get the flu (influenza) shot every year.  Go to the dentist as often as told.  Wash your hands often. If soap and water are not available, use hand sanitizer.  Contact a doctor if:  You have a fever.  You lose sleep because  your cough medicine does not help. Get help right away if:  You are short of breath and it gets worse.  You have more chest pain.  Your sickness gets worse. This is very serious if: ? You are an older adult. ? Your body's defense system is weak.  You cough up blood. This information is not intended to replace advice given to you by your health care provider. Make sure you discuss any questions you have with your health care provider. Document Released: 06/07/2007 Document Revised: 05/27/2015 Document Reviewed: 04/15/2014 Elsevier Interactive Patient Education  2018 [employer]. Chronic Obstructive Pulmonary Disease Chronic obstructive pulmonary disease (COPD) is a long-term (chronic) lung problem. When you have COPD, it is hard for air to get in and out of your lungs. The way your lungs work will never return to normal. Usually the condition gets worse over time. There are things you can do to keep yourself as healthy as possible. Your doctor may treat your condition with:  Medicines.  Quitting smoking, if you smoke.  Rehabilitation. This may involve a team of specialists.  Oxygen.  Exercise and changes to your diet.  Lung surgery.  Comfort measures (palliative care).  Follow these instructions at home: Medicines  Take over-the-counter and prescription medicines only as told by your doctor.  Talk to your doctor before taking any cough or allergy medicines. You may need to avoid medicines that cause your lungs to be dry. Lifestyle  If  you smoke, stop. Smoking makes the problem worse. If you need help quitting, ask your doctor.  Avoid being around things that make your breathing worse. This may include smoke, chemicals, and fumes.  Stay active, but remember to also rest.  Learn and use tips on how to relax.  Make sure you get enough sleep. Most adults need at least 7 hours a night.  Eat healthy foods. Eat smaller meals more often. Rest before meals. Controlled  breathing  Learn and use tips on how to control your breathing as told by your doctor. Try: ? Breathing in (inhaling) through your nose for 1 second. Then, pucker your lips and breath out (exhale) through your lips for 2 seconds. ? Putting one hand on your belly (abdomen). Breathe in slowly through your nose for 1 second. Your hand on your belly should move out. Pucker your lips and breathe out slowly through your lips. Your hand on your belly should move in as you breathe out. Controlled coughing  Learn and use controlled coughing to clear mucus from your lungs. The steps are: 1. Lean your head a little forward. 2. Breathe in deeply. 3. Try to hold your breath for 3 seconds. 4. Keep your mouth slightly open while coughing 2 times. 5. Spit any mucus out into a tissue. 6. Rest and do the steps again 1 or 2 times as needed. General instructions  Make sure you get all the shots (vaccines) that your doctor recommends. Ask your doctor about a flu shot and a pneumonia shot.  Use oxygen therapy and therapy to help improve your lungs (pulmonary rehabilitation) if told by your doctor. If you need home oxygen therapy, ask your doctor if you should buy a tool to measure your oxygen level (oximeter).  Make a COPD action plan with your doctor. This helps you know what to do if you feel worse than usual.  Manage any other conditions you have as told by your doctor.  Avoid going outside when it is very hot, cold, or humid.  Avoid people who have a sickness you can catch (contagious).  Keep all follow-up visits as told by your doctor. This is important. Contact a doctor if:  You cough up more mucus than usual.  There is a change in the color or thickness of the mucus.  It is harder to breathe than usual.  Your breathing is faster than usual.  You have trouble sleeping.  You need to use your medicines more often than usual.  You have trouble doing your normal activities such as getting  dressed or walking around the house. Get help right away if:  You have shortness of breath while resting.  You have shortness of breath that stops you from: ? Being able to talk. ? Doing normal activities.  Your chest hurts for longer than 5 minutes.  Your skin color is more blue than usual.  Your pulse oximeter shows that you have low oxygen for longer than 5 minutes.  You have a fever.  You feel too tired to breathe normally. Summary  Chronic obstructive pulmonary disease (COPD) is a long-term lung problem.  The way your lungs work will never return to normal. Usually the condition gets worse over time. There are things you can do to keep yourself as healthy as possible.  Take over-the-counter and prescription medicines only as told by your doctor.  If you smoke, stop. Smoking makes the problem worse. This information is not intended to replace advice given to you  by your health care provider. Make sure you discuss any questions you have with your health care provider. Document Released: 06/07/2007 Document Revised: 05/27/2015 Document Reviewed: 08/15/2012 Elsevier Interactive Patient Education  2017 [employer].

## 2017-08-16 ENCOUNTER — Ambulatory Visit (HOSPITAL_COMMUNITY)
Admission: RE | Admit: 2017-08-16 | Discharge: 2017-08-16 | Disposition: A | Discharge status: Home / Self Care | Payer: Medicare Other | Source: Ambulatory Visit | Attending: Family Medicine | Admitting: Family Medicine

## 2017-08-16 ENCOUNTER — Other Ambulatory Visit: Payer: Self-pay | Admitting: Family Medicine

## 2017-08-16 DIAGNOSIS — Z8701 Personal history of pneumonia (recurrent): Secondary | ICD-10-CM | POA: Insufficient documentation

## 2017-08-16 DIAGNOSIS — J9811 Atelectasis: Secondary | ICD-10-CM | POA: Diagnosis not present

## 2017-08-16 DIAGNOSIS — R0602 Shortness of breath: Secondary | ICD-10-CM | POA: Diagnosis not present

## 2017-08-16 DIAGNOSIS — R05 Cough: Secondary | ICD-10-CM | POA: Diagnosis not present

## 2017-08-16 MED ORDER — IPRATROPIUM-ALBUTEROL 0.5-2.5 (3) MG/3ML IN SOLN
3.0000 mL | Freq: Once | RESPIRATORY_TRACT | Status: AC
  Administered 2017-08-15: 3 mL via RESPIRATORY_TRACT

## 2017-08-17 DIAGNOSIS — R062 Wheezing: Secondary | ICD-10-CM | POA: Diagnosis not present

## 2017-08-17 DIAGNOSIS — J189 Pneumonia, unspecified organism: Secondary | ICD-10-CM | POA: Diagnosis not present

## 2017-08-17 DIAGNOSIS — J449 Chronic obstructive pulmonary disease, unspecified: Secondary | ICD-10-CM | POA: Diagnosis not present

## 2017-08-17 MED ORDER — NEBULIZER/TUBING/MOUTHPIECE KIT: PACK | 0 refills | Status: DC

## 2017-08-17 NOTE — Addendum Note (Signed)
Addended by: Delsa Grana on: 08/17/2017 03:03 PM   Modules accepted: Orders

## 2017-08-17 NOTE — Progress Notes (Signed)
I am reprinting Rx for nebulizer machine, printing note and we will have to fax it to [employer] to see if they can get approval.

## 2017-08-21 ENCOUNTER — Encounter: Payer: Self-pay | Admitting: Family Medicine

## 2017-08-21 ENCOUNTER — Ambulatory Visit (INDEPENDENT_AMBULATORY_CARE_PROVIDER_SITE_OTHER): Payer: Medicare Other | Admitting: Family Medicine

## 2017-08-21 ENCOUNTER — Telehealth: Payer: Self-pay | Admitting: *Deleted

## 2017-08-21 VITALS — BP 122/60 | HR 71 | Temp 97.7°F | Resp 18

## 2017-08-21 DIAGNOSIS — J181 Lobar pneumonia, unspecified organism: Secondary | ICD-10-CM

## 2017-08-21 DIAGNOSIS — J189 Pneumonia, unspecified organism: Secondary | ICD-10-CM

## 2017-08-21 DIAGNOSIS — J449 Chronic obstructive pulmonary disease, unspecified: Secondary | ICD-10-CM | POA: Diagnosis not present

## 2017-08-21 MED ORDER — PREDNISONE 10 MG PO TABS: ORAL_TABLET | ORAL | 0 refills | Status: DC

## 2017-08-21 MED ORDER — FLUCONAZOLE 150 MG PO TABS: 150.0000 mg | ORAL_TABLET | ORAL | 1 refills | Status: DC | PRN

## 2017-08-21 MED ORDER — FLUCONAZOLE 150 MG PO TABS: 150.0000 mg | ORAL_TABLET | ORAL | 0 refills | Status: DC | PRN

## 2017-08-21 NOTE — Progress Notes (Signed)
Patient ID: [name], male    DOB: 1934/12/15, 83 y.o.   MRN: [id number]  PCP: Alycia Rossetti, MD  Chief Complaint  Patient presents with  . follow up with pneumonia    Subjective:   Reginal Wojcicki is a 83 y.o. male, presents to clinic with CC of pneumonia with improving cough, wheeze and shortness of breath.  He got the nebulizer machine and treatment vials.  He has one more day of steroids and 2 days left of antibiotics.  He is still coughing, now it is productive.  His coughing fits has been less frequent, less severe.  He is able to breathe a little bit deeper than last week and he can talk in full sentences now.  He continues to be wheezy but it is not nearly as severe as it was previously.  Has used cough syrup but he had to pay cash for.  I did help temporarily with his coughing.  He continues to cough worse first thing in the morning and at night when laying down.  His wife states that he has not been confused, sleepy.  They both deny fever, sweats, chills, nausea, vomiting.  He denies any change to his appetite.  Patient and his wife both state that he does not drink very much at all throughout the day, he does not like water.  He denies any change to his weight.  He denies chest pain, shortness of breath.  He has had no respiratory distress.  He has not seen pulmonology outpt.     Patient Active Problem List   Diagnosis Date Noted  . Carotid artery disease ([place]) 03/26/2017  . Internal carotid artery stenosis, left 03/06/2017  . Dysphagia 03/06/2017  . Pulmonary nodules 03/06/2017  . Pneumonia 03/03/2017  . Seizure ([place]) 03/03/2017  . Protein-calorie malnutrition ([place]) 01/10/2017  . Chronic pain 10/02/2016  . Depression, major, single episode, moderate ([place]) 08/31/2016  . Chronic insomnia 08/31/2016  . Lumbar spinal stenosis 08/18/2016  . Wheelchair bound 08/18/2016  . Dementia 08/18/2016  . Urinary incontinence 08/18/2016  . Polypharmacy 08/18/2016  . Memory difficulty  08/04/2014  . Numbness and tingling of right arm 08/04/2014  . Abnormality of gait 08/01/2013  . Anemia 12/05/2012  . Early satiety 12/03/2012  . History of colon polyps 12/03/2012  . Facial droop 10/12/2012  . Spinal stenosis of cervical region 07/01/2012  . GERD 05/27/2009  . DYSPHAGIA UNSPECIFIED 05/27/2009  . CHANGE IN BOWELS 05/27/2009  . SMOKER 09/09/2008  . Essential hypertension 09/09/2008  . History of stroke 09/09/2008  . Constipation 09/09/2008  . HIGH BLOOD PRESSURE 07/25/2006     Prior to Admission medications   Medication Sig Start Date End Date Taking? Authorizing Provider  albuterol (PROVENTIL HFA;VENTOLIN HFA) 108 (90 Base) MCG/ACT inhaler Inhale 2 puffs into the lungs every 4 (four) hours as needed for wheezing or shortness of breath. 02/27/17  Yes [place], Modena Nunnery, MD  albuterol (PROVENTIL HFA;VENTOLIN HFA) 108 (90 Base) MCG/ACT inhaler Inhale 2 puffs into the lungs every 4 (four) hours as needed for wheezing or shortness of breath. 08/15/17  Yes Delsa Grana, PA-C  albuterol (PROVENTIL) (2.5 MG/3ML) 0.083% nebulizer solution Take 3 mLs (2.5 mg total) by nebulization every 6 (six) hours as needed for wheezing or shortness of breath. 08/15/17  Yes Delsa Grana, PA-C  aspirin EC 81 MG tablet Take 162 mg by mouth at bedtime.    Yes [provider]  benzonatate (TESSALON) 100 MG capsule Take 1 capsule (  100 mg total) by mouth 3 (three) times daily as needed for cough. 08/15/17  Yes Delsa Grana, PA-C  Cholecalciferol (VITAMIN D-3) 1000 units CAPS TAKE 1 CAPSULE BY MOUTH ONCE DAILY. 05/15/17  Yes [place], Modena Nunnery, MD  citalopram (CELEXA) 20 MG tablet TAKE 1 TABLET BY MOUTH ONCE A DAY FOR DEPRESSION. 02/22/17  Yes [place], Modena Nunnery, MD  donepezil (ARICEPT) 10 MG tablet TAKE (1) TABLET BY MOUTH AT BEDTIME. 05/20/15  Yes Kathrynn Ducking, MD  fluticasone (FLONASE) 50 MCG/ACT nasal spray USE 1 OR 2 SPRAYS IN EACH NOSTRIL EVERY DAY FOR NASAL ALLERGIES. 02/22/17  Yes [place],  Modena Nunnery, MD  HYDROcodone-acetaminophen (NORCO/VICODIN) 5-325 MG tablet TAKE 1 TABLET BY MOUTH THREE TIMES DAILY AS NEEDED FOR MODERATE PAIN 06/19/17  Yes [place], Modena Nunnery, MD  HYDROcodone-homatropine [facility]) 5-1.5 MG/5ML syrup Take 5 mLs by mouth every 8 (eight) hours as needed for cough. 08/15/17  Yes Delsa Grana, PA-C  levETIRAcetam (KEPPRA) 250 MG tablet TAKE (1) TABLET BY MOUTH EACH MORNING. 08/17/17  Yes [place], Modena Nunnery, MD  levofloxacin (LEVAQUIN) 750 MG tablet Take 1 tablet (750 mg total) by mouth daily for 7 days. 08/15/17 08/22/17 Yes Delsa Grana, PA-C  lisinopril (PRINIVIL,ZESTRIL) 10 MG tablet TAKE 1 TABLET BY MOUTH ONCE A DAY FOR HIGH BLOOD PRESSURE 08/17/17  Yes [place], Modena Nunnery, MD  LORazepam (ATIVAN) 1 MG tablet TAKE (1) TABLET BY MOUTH AT BEDTIME. 11/21/16  Yes [place], Modena Nunnery, MD  lubiprostone (AMITIZA) 24 MCG capsule Take 1 capsule (24 mcg total) by mouth daily with breakfast. MAY TAKE ADDITIONAL DOSE QD PRN. 01/04/17  Yes [place], Modena Nunnery, MD  mirtazapine (REMERON) 15 MG tablet TAKE 1/2 TABLET(7.5MG) BY MOUTH AT BEDTIME FOR APPETITE. 08/17/17  Yes [place], Modena Nunnery, MD  naproxen sodium (ALEVE) 220 MG tablet Take 440 mg by mouth daily as needed.   Yes [provider]  nitroGLYCERIN (NITROSTAT) 0.4 MG SL tablet Place 0.4 mg under the tongue every 5 (five) minutes as needed for chest pain.   Yes [provider]  nystatin (MYCOSTATIN/NYSTOP) powder Apply topically 4 (four) times daily. As needed to affected areas 06/04/17  Yes [place], Modena Nunnery, MD  nystatin cream (MYCOSTATIN) Apply 1 application topically 2 (two) times daily. TO GROIN/Scrotum 06/04/17  Yes [place], Modena Nunnery, MD  omeprazole (PRILOSEC) 20 MG capsule TAKE 1 CAPSULE BY MOUTH ONCE DAILY FOR RELFUX ESOPHAGITIS OR STOMACH ULCERS. 08/17/17  Yes [place], Modena Nunnery, MD  predniSONE (DELTASONE) 20 MG tablet 2 tabs poqday 1-3, 1 tabs poqday 4-6 08/15/17  Yes Delsa Grana, PA-C  Respiratory Therapy Supplies  (NEBULIZER/TUBING/MOUTHPIECE) KIT Disp one nebulizer machine, tubing set and mouthpiece kit 08/17/17  Yes Delsa Grana, PA-C  saccharomyces boulardii (FLORASTOR) 250 MG capsule Take 1 capsule (250 mg total) by mouth 2 (two) times daily. 04/25/17  Yes Georgette Shell, MD  traZODone (DESYREL) 100 MG tablet TAKE (1) TABLET BY MOUTH AT BEDTIME. 08/17/17  Yes [place], Modena Nunnery, MD  VESICARE 5 MG tablet TAKE ONE TABLET ONCE DAILY FOR BLADDER. 08/17/17  Yes , Modena Nunnery, MD     Allergies  Allergen Reactions  . Aspirin Nausea And Vomiting    Tolerates 61m daily     Family History  Problem Relation Age of Onset  . Cancer Mother   . Dementia Father   . Colon cancer Neg Hx      Social History   Socioeconomic History  . Marital status: Married    Spouse name: Not on file  .  Number of children: 2  . Years of education: hs  . Highest education level: Not on file  Occupational History  . Occupation: Retired    Fish farm manager: RETIRED  Social Needs  . Financial resource strain: Not on file  . Food insecurity:    Worry: Not on file    Inability: Not on file  . Transportation needs:    Medical: Not on file    Non-medical: Not on file  Tobacco Use  . Smoking status: Current Some Day Smoker    Packs/day: 0.50    Years: 60.00    Pack years: 30.00    Types: Cigarettes  . Smokeless tobacco: Never Used  Substance and Sexual Activity  . Alcohol use: No  . Drug use: No  . Sexual activity: Not Currently  Lifestyle  . Physical activity:    Days per week: Not on file    Minutes per session: Not on file  . Stress: Not on file  Relationships  . Social connections:    Talks on phone: Not on file    Gets together: Not on file    Attends religious service: Not on file    Active member of club or organization: Not on file    Attends meetings of clubs or organizations: Not on file    Relationship status: Not on file  . Intimate partner violence:    Fear of current or ex partner: Not on  file    Emotionally abused: Not on file    Physically abused: Not on file    Forced sexual activity: Not on file  Other Topics Concern  . Not on file  Social History Narrative   Patient drinks 1-2 cups of caffeine daily.   Patient is right handed.     Review of Systems  Constitutional: Negative.  Negative for chills, diaphoresis, fatigue and fever.  HENT: Negative.   Eyes: Negative.   Respiratory: Positive for cough, shortness of breath and wheezing.   Cardiovascular: Negative for chest pain, palpitations and leg swelling.  Gastrointestinal: Negative.   Endocrine: Negative.   Genitourinary: Negative.   Musculoskeletal: Negative.   Skin: Negative.   Neurological: Negative.  Negative for dizziness, syncope and weakness.  Hematological: Negative.   Psychiatric/Behavioral: Negative for confusion.  All other systems reviewed and are negative.      Objective:    Vitals:   08/21/17 1054  BP: 122/60  Pulse: 71  Resp: 18  Temp: 97.7 F (36.5 C)  TempSrc: Oral  SpO2: 91%      Physical Exam  Constitutional: He is oriented to person, place, and time. He appears well-developed and well-nourished.  Non-toxic appearance. He does not appear ill. No distress.  Elderly AAM, seated in wheelchair, non-toxic and chronically ill appearing, NAD, alert  HENT:  Head: Normocephalic and atraumatic.  Right Ear: Tympanic membrane, external ear and ear canal normal.  Left Ear: Tympanic membrane, external ear and ear canal normal.  Nose: Mucosal edema and rhinorrhea present.  Mouth/Throat: Uvula is midline. No trismus in the jaw. No uvula swelling. No oropharyngeal exudate, posterior oropharyngeal edema or posterior oropharyngeal erythema.  MM dry, white patches and diffuse mild erythema to oral mucosa  Eyes: Pupils are equal, round, and reactive to light. Conjunctivae, EOM and lids are normal.  Neck: Trachea normal, normal range of motion and phonation normal. Neck supple. No tracheal  deviation present.  Cardiovascular: Regular rhythm and normal pulses. Exam reveals distant heart sounds. Exam reveals no gallop and no friction rub.  No murmur heard. Pulses:      Radial pulses are 2+ on the right side, and 2+ on the left side.  Pulmonary/Chest: Effort normal. No accessory muscle usage or stridor. No tachypnea. No respiratory distress. He has decreased breath sounds in the right lower field and the left lower field. He has wheezes (diffuse inspiratory and exp wheeze). He has rhonchi.  Inspiratory and expiratory faint wheeze, distant BS diminished b/l at the bases, faint crackles to LLL with good, deep inspiratory effort which triggers wheezy cough and rhonchi.  Poor inspiratory effort at baseline, speaking in complete sentences, no distress, no accessory muscle use, no retractions.  Abdominal: Soft. Normal appearance and bowel sounds are normal. He exhibits no distension. There is no tenderness.  abd wall ttp, no rebound no guarding  Musculoskeletal: Normal range of motion. He exhibits no edema.  Neurological: He is alert and oriented to person, place, and time. Gait normal.  Skin: Skin is warm, dry and intact. Capillary refill takes less than 2 seconds. No rash noted. He is not diaphoretic. No cyanosis. No pallor. Nails show clubbing.  Psychiatric: He has a normal mood and affect. His speech is normal and behavior is normal.  Nursing note and vitals reviewed.         Assessment & Plan:      ICD-10-CM   1. Community acquired pneumonia of left lower lobe of lung ([place]) J18.1 DG Chest 2 View    Ambulatory referral to Pulmonology  2. Chronic obstructive pulmonary disease, unspecified COPD type ([place]) J44.9 predniSONE (DELTASONE) 10 MG tablet    Ambulatory referral to Pulmonology    83 y/o male returns for recheck of CAP to [facility], dx 6 days ago, recent multifocal PNA (multiple times this year per his wife) with hx of COPD Pt has been taking steroids, antibiotics cough meds, got  nebulizer yesterday and nebs have been helping.  Cough is not as severe or frequent.  He feels wheeze has improved.   On exam he has significantly less coughing and decreased WOB at rest.  Pulmonary exam is significant for splinting inspiratory effort, diminished breath sounds bilaterally at the bases, distant breath sounds throughout with faint inspiratory and expiratory wheezes.  I added a longer dosage of steroids, anticipate him needing longer treatment to help inflammation and chest and airways as he continues to have wheeze and bronchospasms with deep inspiration.  Plan - Finish abx, continue steroids (added 20 mg for 5 d, taper to 10 mg for 5 d), con't nebs BID for several days and albuterol nebs also PRN for wheeze/SOB Add mucinex, push fluids (a lot of fluids, pt oral mucosa very dry, and per his wife refuses to drink water) Will cover for oral thrush - diflucan x 2 to cover with abx use and hx of yeast infections.  F/up in 1-2 weeks for recheck  Future f/up CXR ordered for checking for interval resolution after tx.  Pulm referral to further eval and manage.   Delsa Grana, PA-C 08/21/17 11:30 AM

## 2017-08-21 NOTE — Patient Instructions (Addendum)
Continue antibiotic until finished.    When steroids are complete get additional steroids from pharmacy to continue 20 mg for 5 days and then 10 mg x 5 days.  Do nebulizer treatments morning and night  Get and take mucinex over the counter and DRINK plenty of water.  I have referred you to pulmonology to get checked by the specialist.  If you do not get in with them in the next 1-2 weeks would like to recheck you here in the office.     Community-Acquired Pneumonia, Adult Pneumonia is an infection of the lungs. One type of pneumonia can happen while a person is in a hospital. A different type can happen when a person is not in a hospital (community-acquired pneumonia). It is easy for this kind to spread from person to person. It can spread to you if you breathe near an infected person who coughs or sneezes. Some symptoms include:  A dry cough.  A wet (productive) cough.  Fever.  Sweating.  Chest pain.  Follow these instructions at home:  Take over-the-counter and prescription medicines only as told by your doctor. ? Only take cough medicine if you are losing sleep. ? If you were prescribed an antibiotic medicine, take it as told by your doctor. Do not stop taking the antibiotic even if you start to feel better.  Sleep with your head and neck raised (elevated). You can do this by putting a few pillows under your head, or you can sleep in a recliner.  Do not use tobacco products. These include cigarettes, chewing tobacco, and e-cigarettes. If you need help quitting, ask your doctor.  Drink enough water to keep your pee (urine) clear or pale yellow. A shot (vaccine) can help prevent pneumonia. Shots are often suggested for:  People older than 81 years of age.  People older than 81 years of age: ? Who are having cancer treatment. ? Who have long-term (chronic) lung disease. ? Who have problems with their body's defense system (immune system).  You may also prevent  pneumonia if you take these actions:  Get the flu (influenza) shot every year.  Go to the dentist as often as told.  Wash your hands often. If soap and water are not available, use hand sanitizer.  Contact a doctor if:  You have a fever.  You lose sleep because your cough medicine does not help. Get help right away if:  You are short of breath and it gets worse.  You have more chest pain.  Your sickness gets worse. This is very serious if: ? You are an older adult. ? Your body's defense system is weak.  You cough up blood. This information is not intended to replace advice given to you by your health care provider. Make sure you discuss any questions you have with your health care provider. Document Released: 06/07/2007 Document Revised: 05/27/2015 Document Reviewed: 04/15/2014 Elsevier Interactive Patient Education  2018 [employer]   Chronic Obstructive Pulmonary Disease Chronic obstructive pulmonary disease (COPD) is a long-term (chronic) lung problem. When you have COPD, it is hard for air to get in and out of your lungs. The way your lungs work will never return to normal. Usually the condition gets worse over time. There are things you can do to keep yourself as healthy as possible. Your doctor may treat your condition with: Medicines. Quitting smoking, if you smoke. Rehabilitation. This may involve a team of specialists. Oxygen. Exercise and changes to your diet. Lung surgery. Comfort  measures (palliative care).  Follow these instructions at home: Medicines Take over-the-counter and prescription medicines only as told by your doctor. Talk to your doctor before taking any cough or allergy medicines. You may need to avoid medicines that cause your lungs to be dry. Lifestyle If you smoke, stop. Smoking makes the problem worse. If you need help quitting, ask your doctor. Avoid being around things that make your breathing worse. This may include smoke, chemicals, and  fumes. Stay active, but remember to also rest. Learn and use tips on how to relax. Make sure you get enough sleep. Most adults need at least 7 hours a night. Eat healthy foods. Eat smaller meals more often. Rest before meals. Controlled breathing Learn and use tips on how to control your breathing as told by your doctor. Try: Breathing in (inhaling) through your nose for 1 second. Then, pucker your lips and breath out (exhale) through your lips for 2 seconds. Putting one hand on your belly (abdomen). Breathe in slowly through your nose for 1 second. Your hand on your belly should move out. Pucker your lips and breathe out slowly through your lips. Your hand on your belly should move in as you breathe out. Controlled coughing Learn and use controlled coughing to clear mucus from your lungs. The steps are: Lean your head a little forward. Breathe in deeply. Try to hold your breath for 3 seconds. Keep your mouth slightly open while coughing 2 times. Spit any mucus out into a tissue. Rest and do the steps again 1 or 2 times as needed. General instructions Make sure you get all the shots (vaccines) that your doctor recommends. Ask your doctor about a flu shot and a pneumonia shot. Use oxygen therapy and therapy to help improve your lungs (pulmonary rehabilitation) if told by your doctor. If you need home oxygen therapy, ask your doctor if you should buy a tool to measure your oxygen level (oximeter). Make a COPD action plan with your doctor. This helps you know what to do if you feel worse than usual. Manage any other conditions you have as told by your doctor. Avoid going outside when it is very hot, cold, or humid. Avoid people who have a sickness you can catch (contagious). Keep all follow-up visits as told by your doctor. This is important. Contact a doctor if: You cough up more mucus than usual. There is a change in the color or thickness of the mucus. It is harder to breathe than  usual. Your breathing is faster than usual. You have trouble sleeping. You need to use your medicines more often than usual. You have trouble doing your normal activities such as getting dressed or walking around the house. Get help right away if: You have shortness of breath while resting. You have shortness of breath that stops you from: Being able to talk. Doing normal activities. Your chest hurts for longer than 5 minutes. Your skin color is more blue than usual. Your pulse oximeter shows that you have low oxygen for longer than 5 minutes. You have a fever. You feel too tired to breathe normally. Summary Chronic obstructive pulmonary disease (COPD) is a long-term lung problem. The way your lungs work will never return to normal. Usually the condition gets worse over time. There are things you can do to keep yourself as healthy as possible. Take over-the-counter and prescription medicines only as told by your doctor. If you smoke, stop. Smoking makes the problem worse. This information is not intended to  replace advice given to you by your health care provider. Make sure you discuss any questions you have with your health care provider. Document Released: 06/07/2007 Document Revised: 05/27/2015 Document Reviewed: 08/15/2012 Elsevier Interactive Patient Education  2017 [employer].

## 2017-08-21 NOTE — Telephone Encounter (Signed)
Received call from pharmacy.   Reports that prescription for diflucan was received. States that directions read as follows: Take 1 tablet (150 mg total) by mouth every 3 (three) days as needed for up to 2 doses.  Reports that only (1) tablet was sent to pharmacy. Requested refill to be placed on prescription so that patient make take (2) doses if needed.   Please advise.

## 2017-08-21 NOTE — Telephone Encounter (Signed)
Will do, thanks

## 2017-08-28 ENCOUNTER — Encounter: Payer: Self-pay | Admitting: Family Medicine

## 2017-08-28 ENCOUNTER — Ambulatory Visit (INDEPENDENT_AMBULATORY_CARE_PROVIDER_SITE_OTHER): Payer: Medicare Other | Admitting: Family Medicine

## 2017-08-28 ENCOUNTER — Other Ambulatory Visit: Payer: Self-pay

## 2017-08-28 VITALS — BP 120/66 | HR 98 | Temp 97.9°F | Resp 16

## 2017-08-28 DIAGNOSIS — J449 Chronic obstructive pulmonary disease, unspecified: Secondary | ICD-10-CM | POA: Diagnosis not present

## 2017-08-28 DIAGNOSIS — J181 Lobar pneumonia, unspecified organism: Secondary | ICD-10-CM | POA: Diagnosis not present

## 2017-08-28 DIAGNOSIS — J189 Pneumonia, unspecified organism: Secondary | ICD-10-CM

## 2017-08-28 NOTE — Progress Notes (Signed)
Patient ID: [name], male    DOB: 1934-11-24, 83 y.o.   MRN: [id number]  PCP: Alycia Rossetti, MD  Chief Complaint  Patient presents with  . Follow-up    is fasting    Subjective:   [name] is a 83 y.o. male, presents to clinic with CC LLL CAP and COPD exacerbation continuing to improve since first being seen for it almost 2 weeks ago.  He is continuing to cough up some phlegm, but cough, wheeze and SOB have significantly improved.  He continues to smoke reported "4 cigarettes a day."  His wife notes that he has gotten his appetite back.  He is trying to drink liquids more.  He denies CP, sweats, SOB.   Patient Active Problem List   Diagnosis Date Noted  . Carotid artery disease ([place]) 03/26/2017  . Internal carotid artery stenosis, left 03/06/2017  . Dysphagia 03/06/2017  . Pulmonary nodules 03/06/2017  . Pneumonia 03/03/2017  . Seizure ([place]) 03/03/2017  . Protein-calorie malnutrition ([place]) 01/10/2017  . Chronic pain 10/02/2016  . Depression, major, single episode, moderate ([place]) 08/31/2016  . Chronic insomnia 08/31/2016  . Lumbar spinal stenosis 08/18/2016  . Wheelchair bound 08/18/2016  . Dementia 08/18/2016  . Urinary incontinence 08/18/2016  . Polypharmacy 08/18/2016  . Memory difficulty 08/04/2014  . Numbness and tingling of right arm 08/04/2014  . Abnormality of gait 08/01/2013  . Anemia 12/05/2012  . Early satiety 12/03/2012  . History of colon polyps 12/03/2012  . Facial droop 10/12/2012  . Spinal stenosis of cervical region 07/01/2012  . GERD 05/27/2009  . DYSPHAGIA UNSPECIFIED 05/27/2009  . CHANGE IN BOWELS 05/27/2009  . SMOKER 09/09/2008  . Essential hypertension 09/09/2008  . History of stroke 09/09/2008  . Constipation 09/09/2008  . HIGH BLOOD PRESSURE 07/25/2006     Prior to Admission medications   Medication Sig Start Date End Date Taking? Authorizing Provider  albuterol (PROVENTIL HFA;VENTOLIN HFA) 108 (90 Base) MCG/ACT inhaler Inhale 2  puffs into the lungs every 4 (four) hours as needed for wheezing or shortness of breath. 02/27/17  Yes [place], Modena Nunnery, MD  albuterol (PROVENTIL HFA;VENTOLIN HFA) 108 (90 Base) MCG/ACT inhaler Inhale 2 puffs into the lungs every 4 (four) hours as needed for wheezing or shortness of breath. 08/15/17  Yes Delsa Grana, PA-C  albuterol (PROVENTIL) (2.5 MG/3ML) 0.083% nebulizer solution Take 3 mLs (2.5 mg total) by nebulization every 6 (six) hours as needed for wheezing or shortness of breath. 08/15/17  Yes Delsa Grana, PA-C  aspirin EC 81 MG tablet Take 162 mg by mouth at bedtime.    Yes [provider]  benzonatate (TESSALON) 100 MG capsule Take 1 capsule (100 mg total) by mouth 3 (three) times daily as needed for cough. 08/15/17  Yes Delsa Grana, PA-C  Cholecalciferol (VITAMIN D-3) 1000 units CAPS TAKE 1 CAPSULE BY MOUTH ONCE DAILY. 05/15/17  Yes [place], Modena Nunnery, MD  citalopram (CELEXA) 20 MG tablet TAKE 1 TABLET BY MOUTH ONCE A DAY FOR DEPRESSION. 02/22/17  Yes , Modena Nunnery, MD  donepezil (ARICEPT) 10 MG tablet TAKE (1) TABLET BY MOUTH AT BEDTIME. 05/20/15  Yes Kathrynn Ducking, MD  fluconazole (DIFLUCAN) 150 MG tablet Take 1 tablet (150 mg total) by mouth every 3 (three) days as needed for up to 2 doses (oral yeast infection/oral thrush). 08/21/17  Yes Delsa Grana, PA-C  fluticasone (FLONASE) 50 MCG/ACT nasal spray USE 1 OR 2 SPRAYS IN EACH NOSTRIL EVERY DAY FOR NASAL ALLERGIES. 02/22/17  Yes [place], Modena Nunnery, MD  HYDROcodone-acetaminophen (NORCO/VICODIN) 5-325 MG tablet TAKE 1 TABLET BY MOUTH THREE TIMES DAILY AS NEEDED FOR MODERATE PAIN 06/19/17  Yes [place], Modena Nunnery, MD  HYDROcodone-homatropine [facility]) 5-1.5 MG/5ML syrup Take 5 mLs by mouth every 8 (eight) hours as needed for cough. 08/15/17  Yes Delsa Grana, PA-C  levETIRAcetam (KEPPRA) 250 MG tablet TAKE (1) TABLET BY MOUTH EACH MORNING. 08/17/17  Yes [place], Modena Nunnery, MD  lisinopril (PRINIVIL,ZESTRIL) 10 MG tablet TAKE 1 TABLET BY  MOUTH ONCE A DAY FOR HIGH BLOOD PRESSURE 08/17/17  Yes [place], Modena Nunnery, MD  LORazepam (ATIVAN) 1 MG tablet TAKE (1) TABLET BY MOUTH AT BEDTIME. 11/21/16  Yes [place], Modena Nunnery, MD  lubiprostone (AMITIZA) 24 MCG capsule Take 1 capsule (24 mcg total) by mouth daily with breakfast. MAY TAKE ADDITIONAL DOSE QD PRN. 01/04/17  Yes [place], Modena Nunnery, MD  mirtazapine (REMERON) 15 MG tablet TAKE 1/2 TABLET(7.5MG) BY MOUTH AT BEDTIME FOR APPETITE. 08/17/17  Yes [place], Modena Nunnery, MD  naproxen sodium (ALEVE) 220 MG tablet Take 440 mg by mouth daily as needed.   Yes [provider]  nitroGLYCERIN (NITROSTAT) 0.4 MG SL tablet Place 0.4 mg under the tongue every 5 (five) minutes as needed for chest pain.   Yes [provider]  nystatin (MYCOSTATIN/NYSTOP) powder Apply topically 4 (four) times daily. As needed to affected areas 06/04/17  Yes [place], Modena Nunnery, MD  nystatin cream (MYCOSTATIN) Apply 1 application topically 2 (two) times daily. TO GROIN/Scrotum 06/04/17  Yes [place], Modena Nunnery, MD  omeprazole (PRILOSEC) 20 MG capsule TAKE 1 CAPSULE BY MOUTH ONCE DAILY FOR RELFUX ESOPHAGITIS OR STOMACH ULCERS. 08/17/17  Yes [place], Modena Nunnery, MD  predniSONE (DELTASONE) 10 MG tablet Take 2 tabs (20 mg) PO daily with breakfast x 5 d, then take 1 tab (10 mg) PO daily with breakfast x 5 d 08/21/17  Yes Delsa Grana, PA-C  Respiratory Therapy Supplies (NEBULIZER/TUBING/MOUTHPIECE) KIT Disp one nebulizer machine, tubing set and mouthpiece kit 08/17/17  Yes Delsa Grana, PA-C  saccharomyces boulardii (FLORASTOR) 250 MG capsule Take 1 capsule (250 mg total) by mouth 2 (two) times daily. 04/25/17  Yes Georgette Shell, MD  traZODone (DESYREL) 100 MG tablet TAKE (1) TABLET BY MOUTH AT BEDTIME. 08/17/17  Yes [place], Modena Nunnery, MD  VESICARE 5 MG tablet TAKE ONE TABLET ONCE DAILY FOR BLADDER. 08/17/17  Yes , Modena Nunnery, MD     Allergies  Allergen Reactions  . Aspirin Nausea And Vomiting    Tolerates 3m daily      Family History  Problem Relation Age of Onset  . Cancer Mother   . Dementia Father   . Colon cancer Neg Hx      Social History   Socioeconomic History  . Marital status: Married    Spouse name: Not on file  . Number of children: 2  . Years of education: hs  . Highest education level: Not on file  Occupational History  . Occupation: Retired    [employer] farm manager RETIRED  Social Needs  . Financial resource strain: Not on file  . Food insecurity:    Worry: Not on file    Inability: Not on file  . Transportation needs:    Medical: Not on file    Non-medical: Not on file  Tobacco Use  . Smoking status: Current Some Day Smoker    Packs/day: 0.50    Years: 60.00    Pack years: 30.00    Types: Cigarettes  .  Smokeless tobacco: Never Used  Substance and Sexual Activity  . Alcohol use: No  . Drug use: No  . Sexual activity: Not Currently  Lifestyle  . Physical activity:    Days per week: Not on file    Minutes per session: Not on file  . Stress: Not on file  Relationships  . Social connections:    Talks on phone: Not on file    Gets together: Not on file    Attends religious service: Not on file    Active member of club or organization: Not on file    Attends meetings of clubs or organizations: Not on file    Relationship status: Not on file  . Intimate partner violence:    Fear of current or ex partner: Not on file    Emotionally abused: Not on file    Physically abused: Not on file    Forced sexual activity: Not on file  Other Topics Concern  . Not on file  Social History Narrative   Patient drinks 1-2 cups of caffeine daily.   Patient is right handed.     Review of Systems  Constitutional: Negative for activity change, chills, diaphoresis, fatigue and fever.  HENT: Negative.   Eyes: Negative.   Respiratory: Positive for cough. Negative for choking, chest tightness, shortness of breath, wheezing and stridor.   Cardiovascular: Negative.   Gastrointestinal:  Negative.   Endocrine: Negative.   Musculoskeletal: Negative.   Skin: Negative.   Allergic/Immunologic: Negative.   Neurological: Negative.  Negative for weakness.  Hematological: Negative.   Psychiatric/Behavioral: Negative.        Objective:    Vitals:   08/28/17 0949  BP: 120/66  Pulse: 98  Resp: 16  Temp: 97.9 F (36.6 C)  TempSrc: Oral  SpO2: 98%      Physical Exam  Constitutional: He is oriented to person, place, and time. He appears well-developed and well-nourished.  Non-toxic appearance. He does not appear ill. No distress.  Elderly AAM, seated in wheelchair, non-toxic, well-appearing, alert, conversational without coughing fits  HENT:  Head: Normocephalic and atraumatic.  Right Ear: Tympanic membrane, external ear and ear canal normal.  Left Ear: Tympanic membrane, external ear and ear canal normal.  Nose: No mucosal edema or rhinorrhea.  Mouth/Throat: Uvula is midline and oropharynx is clear and moist. No trismus in the jaw. No uvula swelling. No oropharyngeal exudate, posterior oropharyngeal edema or posterior oropharyngeal erythema.  MMM, no erythema  Eyes: Pupils are equal, round, and reactive to light. Conjunctivae, EOM and lids are normal.  Neck: Trachea normal, normal range of motion and phonation normal. Neck supple. No tracheal deviation present.  Cardiovascular: Regular rhythm and normal pulses. Exam reveals distant heart sounds. Exam reveals no gallop and no friction rub.  No murmur heard. Pulses:      Radial pulses are 2+ on the right side, and 2+ on the left side.  Pulmonary/Chest: Effort normal. No accessory muscle usage or stridor. No tachypnea. No respiratory distress. He has no decreased breath sounds. He has no wheezes (diffuse inspiratory and exp wheeze). He has no rhonchi. He has no rales. He exhibits no tenderness.  Good inspiratory effort, no tachypnea/retractions/accessory muscle use  Abdominal: Soft. Normal appearance and bowel sounds are  normal. He exhibits no distension. There is no tenderness.  Musculoskeletal: Normal range of motion. He exhibits no edema.  Neurological: He is alert and oriented to person, place, and time. Gait normal.  Skin: Skin is warm, dry and intact.  Capillary refill takes less than 2 seconds. No rash noted. He is not diaphoretic. No cyanosis. No pallor. Nails show clubbing.  Psychiatric: He has a normal mood and affect. His speech is normal and behavior is normal.  Nursing note and vitals reviewed.         Assessment & Plan:      ICD-10-CM   1. Community acquired pneumonia of left lower lobe of lung ([place]) J18.1   2. Chronic obstructive pulmonary disease, unspecified COPD type ([place]) J44.9     Patient here for recheck after being diagnosed with pneumonia in setting of COPD, current smoker, he appears to continue to improve.  Says that he did appear dehydrated today his mucous membranes appear moist, his wife endorses he is got his appetite back, coughing and breathing continue to improve and he is now able to use the maintenance inhaler.    Plan to repeat chest x-ray in 4 weeks to evaluate resolution of pneumonia, is following up with pulmonary, encouraged to continue to drink plenty of water and Mucinex, continue rescue inhaler as needed.   Delsa Grana, PA-C 08/28/17 10:16 AM

## 2017-08-28 NOTE — Patient Instructions (Addendum)
Get a repeat chest x-ray in about 4 weeks  The pulmonary doctor will call you to schedule an appointment.  Continue your mucinex and drinking a lot of water  Use the inhaler as directed daily   Chronic Obstructive Pulmonary Disease Chronic obstructive pulmonary disease (COPD) is a long-term (chronic) condition that affects the lungs. COPD is a general term that can be used to describe many different lung problems that cause lung swelling (inflammation) and limit airflow, including chronic bronchitis and emphysema. If you have COPD, your lung function will probably never return to normal. In most cases, it gets worse over time. However, there are steps you can take to slow the progression of the disease and improve your quality of life. What are the causes? This condition may be caused by:  Smoking. This is the most common cause.  Certain genes passed down through families.  What increases the risk? The following factors may make you more likely to develop this condition:  Secondhand smoke from cigarettes, pipes, or cigars.  Exposure to chemicals and other irritants such as fumes and dust in the work environment.  Chronic lung conditions or infections.  What are the signs or symptoms? Symptoms of this condition include:  Shortness of breath, especially during physical activity.  Chronic cough with a large amount of thick mucus. Sometimes the cough may not have any mucus (dry cough).  Wheezing.  Rapid breaths.  Gray or bluish discoloration (cyanosis) of the skin, especially in your fingers, toes, or lips.  Feeling tired (fatigue).  Weight loss.  Chest tightness.  Frequent infections.  Episodes when breathing symptoms become much worse (exacerbations).  Swelling in the ankles, feet, or legs. This may occur in later stages of the disease.  How is this diagnosed? This condition is diagnosed based on:  Your medical history.  A physical exam.  You may also have  tests, including:  Lung (pulmonary) function tests. This may include a spirometry test, which measures your ability to exhale properly.  Chest X-ray.  CT scan.  Blood tests.  How is this treated? This condition may be treated with:  Medicines. These may include inhaled rescue medicines to treat acute exacerbations as well as long-term, or maintenance, medicines to prevent flare-ups of COPD. ? Bronchodilators help treat COPD by dilating the airways to allow increased airflow and make your breathing more comfortable. ? Steroids can reduce airway inflammation and help prevent exacerbations.  Smoking cessation. If you smoke, your health care provider may ask you to quit, and may also recommend therapy or replacement products to help you quit.  Pulmonary rehabilitation. This may involve working with a team of health care providers and specialists, such as respiratory, occupational, and physical therapists.  Exercise and physical activity. These are beneficial for nearly all people with COPD.  Nutrition therapy to gain weight, if you are underweight.  Oxygen. Supplemental oxygen therapy is only helpful if you have a low oxygen level in your blood (hypoxemia).  Lung surgery or transplant.  Palliative care. This is to help people with COPD feel comfortable when treatment is no longer working.  Follow these instructions at home: Medicines  Take over-the-counter and prescription medicines (inhaled or pills) only as told by your health care provider.  Talk to your health care provider before taking any cough or allergy medicines. You may need to avoid certain medicines that dry out your airways. Lifestyle  If you are a smoker, the most important thing that you can do is to stop  smoking. Do not use any products that contain nicotine or tobacco, such as cigarettes and e-cigarettes. If you need help quitting, ask your health care provider. Continuing to smoke will cause the disease to  progress faster.  Avoid exposure to things that irritate your lungs, such as smoke, chemicals, and fumes.  Stay active, but balance activity with periods of rest. Exercise and physical activity will help you maintain your ability to do things you want to do.  Learn and use relaxation techniques to manage stress and to control your breathing.  Get the right amount of sleep and get quality sleep. Most adults need 7 or more hours per night.  Eat healthy foods. Eating smaller, more frequent meals and resting before meals may help you maintain your strength. Controlled breathing Learn and use controlled breathing techniques as directed by your health care provider. Controlled breathing techniques include:  Pursed lip breathing. Start by breathing in (inhaling) through your nose for 1 second. Then, purse your lips as if you were going to whistle and breathe out (exhale) through the pursed lips for 2 seconds.  Diaphragmatic breathing. Start by putting one hand on your abdomen just above your waist. Inhale slowly through your nose. The hand on your abdomen should move out. Then purse your lips and exhale slowly. You should be able to feel the hand on your abdomen moving in as you exhale.  Controlled coughing Learn and use controlled coughing to clear mucus from your lungs. Controlled coughing is a series of short, progressive coughs. The steps of controlled coughing are: 1. Lean your head slightly forward. 2. Breathe in deeply using diaphragmatic breathing. 3. Try to hold your breath for 3 seconds. 4. Keep your mouth slightly open while coughing twice. 5. Spit any mucus out into a tissue. 6. Rest and repeat the steps once or twice as needed.  General instructions  Make sure you receive all the vaccines that your health care provider recommends, especially the pneumococcal and influenza vaccines. Preventing infection and hospitalization is very important when you have COPD.  Use oxygen therapy  and pulmonary rehabilitation if directed to by your health care provider. If you require home oxygen therapy, ask your health care provider whether you should purchase a pulse oximeter to measure your oxygen level at home.  Work with your health care provider to develop a COPD action plan. This will help you know what steps to take if your condition gets worse.  Keep other chronic health conditions under control as told by your health care provider.  Avoid extreme temperature and humidity changes.  Avoid contact with people who have an illness that spreads from person to person (is contagious), such as viral infections or pneumonia.  Keep all follow-up visits as told by your health care provider. This is important. Contact a health care provider if:  You are coughing up more mucus than usual.  There is a change in the color or thickness of your mucus.  Your breathing is more labored than usual.  Your breathing is faster than usual.  You have difficulty sleeping.  You need to use your rescue medicines or inhalers more often than expected.  You have trouble doing routine activities such as getting dressed or walking around the house. Get help right away if:  You have shortness of breath while you are resting.  You have shortness of breath that prevents you from: ? Being able to talk. ? Performing your usual physical activities.  You have chest pain lasting  longer than 5 minutes.  Your skin color is more blue (cyanotic) than usual.  You measure low oxygen saturations for longer than 5 minutes with a pulse oximeter.  You have a fever.  You feel too tired to breathe normally. Summary  Chronic obstructive pulmonary disease (COPD) is a long-term (chronic) condition that affects the lungs.  Your lung function will probably never return to normal. In most cases, it gets worse over time. However, there are steps you can take to slow the progression of the disease and improve your  quality of life.  Treatment for COPD may include taking medicines, quitting smoking, pulmonary rehabilitation, and changes to diet and exercise. As the disease progresses, you may need oxygen therapy, a lung transplant, or palliative care.  To help manage your condition, do not smoke, avoid exposure to things that irritate your lungs, stay up to date on all vaccines, and follow your health care provider's instructions for taking medicines. This information is not intended to replace advice given to you by your health care provider. Make sure you discuss any questions you have with your health care provider. Document Released: 09/28/2004 Document Revised: 01/24/2016 Document Reviewed: 01/24/2016 Elsevier Interactive Patient Education  2018 [employer].   Steps to Quit Smoking Smoking tobacco can be bad for your health. It can also affect almost every organ in your body. Smoking puts you and people around you at risk for many serious long-lasting (chronic) diseases. Quitting smoking is hard, but it is one of the best things that you can do for your health. It is never too late to quit. What are the benefits of quitting smoking? When you quit smoking, you lower your risk for getting serious diseases and conditions. They can include:  Lung cancer or lung disease.  Heart disease.  Stroke.  Heart attack.  Not being able to have children (infertility).  Weak bones (osteoporosis) and broken bones (fractures).  If you have coughing, wheezing, and shortness of breath, those symptoms may get better when you quit. You may also get sick less often. If you are pregnant, quitting smoking can help to lower your chances of having a baby of low birth weight. What can I do to help me quit smoking? Talk with your doctor about what can help you quit smoking. Some things you can do (strategies) include:  Quitting smoking totally, instead of slowly cutting back how much you smoke over a period of  time.  Going to in-person counseling. You are more likely to quit if you go to many counseling sessions.  Using resources and support systems, such as: ? Database administrator with a Social worker. ? Phone quitlines. ? Careers information officer. ? Support groups or group counseling. ? Text messaging programs. ? Mobile phone apps or applications.  Taking medicines. Some of these medicines may have nicotine in them. If you are pregnant or breastfeeding, do not take any medicines to quit smoking unless your doctor says it is okay. Talk with your doctor about counseling or other things that can help you.  Talk with your doctor about using more than one strategy at the same time, such as taking medicines while you are also going to in-person counseling. This can help make quitting easier. What things can I do to make it easier to quit? Quitting smoking might feel very hard at first, but there is a lot that you can do to make it easier. Take these steps:  Talk to your family and friends. Ask them to support  and encourage you.  Call phone quitlines, reach out to support groups, or work with a Social worker.  Ask people who smoke to not smoke around you.  Avoid places that make you want (trigger) to smoke, such as: ? Bars. ? Parties. ? Smoke-break areas at work.  Spend time with people who do not smoke.  Lower the stress in your life. Stress can make you want to smoke. Try these things to help your stress: ? Getting regular exercise. ? Deep-breathing exercises. ? Yoga. ? Meditating. ? Doing a body scan. To do this, close your eyes, focus on one area of your body at a time from head to toe, and notice which parts of your body are tense. Try to relax the muscles in those areas.  Download or buy apps on your mobile phone or tablet that can help you stick to your quit plan. There are many free apps, such as QuitGuide from the [employer] Office manager for Disease Control and Prevention). You can find more support from  smokefree.gov and other websites.  This information is not intended to replace advice given to you by your health care provider. Make sure you discuss any questions you have with your health care provider. Document Released: 10/15/2008 Document Revised: 08/17/2015 Document Reviewed: 05/05/2014 Elsevier Interactive Patient Education  2018 [employer].

## 2017-08-29 ENCOUNTER — Other Ambulatory Visit: Payer: Self-pay

## 2017-08-29 DIAGNOSIS — I6523 Occlusion and stenosis of bilateral carotid arteries: Secondary | ICD-10-CM

## 2017-09-12 DIAGNOSIS — L110 Acquired keratosis follicularis: Secondary | ICD-10-CM | POA: Diagnosis not present

## 2017-09-12 DIAGNOSIS — B351 Tinea unguium: Secondary | ICD-10-CM | POA: Diagnosis not present

## 2017-09-12 DIAGNOSIS — I739 Peripheral vascular disease, unspecified: Secondary | ICD-10-CM | POA: Diagnosis not present

## 2017-09-17 DIAGNOSIS — R062 Wheezing: Secondary | ICD-10-CM | POA: Diagnosis not present

## 2017-09-17 DIAGNOSIS — J189 Pneumonia, unspecified organism: Secondary | ICD-10-CM | POA: Diagnosis not present

## 2017-09-17 DIAGNOSIS — J449 Chronic obstructive pulmonary disease, unspecified: Secondary | ICD-10-CM | POA: Diagnosis not present

## 2017-09-18 ENCOUNTER — Ambulatory Visit (INDEPENDENT_AMBULATORY_CARE_PROVIDER_SITE_OTHER): Payer: Medicare Other | Admitting: Internal Medicine

## 2017-09-18 ENCOUNTER — Other Ambulatory Visit: Payer: Self-pay | Admitting: Family Medicine

## 2017-09-18 ENCOUNTER — Encounter: Payer: Self-pay | Admitting: Internal Medicine

## 2017-09-18 ENCOUNTER — Other Ambulatory Visit (INDEPENDENT_AMBULATORY_CARE_PROVIDER_SITE_OTHER): Payer: Medicare Other

## 2017-09-18 VITALS — BP 132/78 | HR 74 | Ht 69.0 in | Wt 165.0 lb

## 2017-09-18 DIAGNOSIS — R0600 Dyspnea, unspecified: Secondary | ICD-10-CM | POA: Diagnosis not present

## 2017-09-18 DIAGNOSIS — J849 Interstitial pulmonary disease, unspecified: Secondary | ICD-10-CM | POA: Diagnosis not present

## 2017-09-18 DIAGNOSIS — Z23 Encounter for immunization: Secondary | ICD-10-CM | POA: Diagnosis not present

## 2017-09-18 DIAGNOSIS — R053 Chronic cough: Secondary | ICD-10-CM

## 2017-09-18 DIAGNOSIS — Z87891 Personal history of nicotine dependence: Secondary | ICD-10-CM | POA: Diagnosis not present

## 2017-09-18 DIAGNOSIS — R05 Cough: Secondary | ICD-10-CM

## 2017-09-18 DIAGNOSIS — R0602 Shortness of breath: Secondary | ICD-10-CM

## 2017-09-18 DIAGNOSIS — Z8701 Personal history of pneumonia (recurrent): Secondary | ICD-10-CM

## 2017-09-18 LAB — SEDIMENTATION RATE: SED RATE: 54 mm/h — AB (ref 0–20)

## 2017-09-18 NOTE — Progress Notes (Signed)
Subjective:    Patient ID: [name], male    DOB: 29-Nov-1934, 83 y.o.   MRN: [id number]  HPI Chief Complaint  Patient presents with  . Consult    Referred by Verline Lema for pna.  Pt had a cxr performed 8/15 and also had a PFT performed 1/24. Pt states he has some occ CP and does a lot of cough that is worse late in the afternoon that he gets up little white phlegm.      [name] , 83 y.o. , with dob 1934/04/10 and male ,Not Hispanic or Latino from [address] [place] [address] - presents to lung clinic for evaluation of a history of recurrent pneumonias. History is mostly given by the wife but also review of the review of chart. Patient is 27 has had a stroke in 2002and is essentially ECOG 4 with good and her mental status to do basic history but is dependent for activities of daily living. He can self transfer and can have control of his bladder and bowel but needs help with his clothing. He is able to swallow without any difficulty According to the wife and him he's had recurrent pneumonia since December 2019. Typical symptoms arecough with increased sputum production and shortness of breath and chest tightness. In a few of these he has had fever. Each time treated with antibiotic and prednisone with resolution. Last episode was end of August 2019.One of the episodes he was admitted. He had a CT scan of the chest in March 2019 that I personally visualized. The radiologist as reported pneumonia but in my personal visualization I feel that he has bilateral left greater than right bibasilar honeycombing and also upper lobe groundglass opacities more concerning for interstitial lung disease. [A comparison 2014 abdominal CT lung cut did not have these findings] he did have a chest x-ray in August 2019 is reported as pneumonia but to me looks like he has left lower lobe atelectasischronic lung scarring. Given his extreme sedentary lifestyle is unclear how much baseline shortness of  breath and cough he has but he does have those.  ILD exposure history: He is ongoing smoking history has had difficulty quitting. 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 Denies any mold exposure in the house. Denies any connective tissue disease.Does have acid reflux.  His pulmonary function test in January 2019 shows a mixture of restriction and normal total lung capacity associated with significant reduction in diffusion capacity suggestive of a mixed emphysema fibrosis pattern   Results for DAITON, COWLES (MRN [id number]) as of 09/18/2017 09:06  Ref. Range 01/25/2017 08:39  FVC-Pre Latest Units: L 2.69  FVC-%Pred-Pre Latest Units: % 77  FEV1-Pre Latest Units: L 2.12  FEV1-%Pred-Pre Latest Units: % 83  Pre FEV1/FVC ratio Latest Units: % 79  Results for TYDE, LAMISON (MRN [id number]) as of 09/18/2017 09:06  Ref. Range 01/25/2017 08:39  DLCO unc Latest Units: ml/min/mmHg 14.41  DLCO unc % pred Latest Units: % 46   Results for CRANFORD, BLESSINGER (MRN [id number]) as of 09/18/2017 09:06  Ref. Range 01/25/2017 08:39  TLC Latest Units: L 5.59  TLC % pred Latest Units: % 81   Review of Systems  Constitutional: Negative for fever and unexpected weight change.  HENT: Positive for congestion, sinus pressure and sneezing. Negative for dental problem, ear pain, nosebleeds, postnasal drip, rhinorrhea, sore throat and trouble swallowing.   Eyes: Positive for itching. Negative for redness.  Respiratory: Positive for cough,  chest tightness, shortness of breath and wheezing.   Cardiovascular: Positive for chest pain. Negative for palpitations and leg swelling.  Gastrointestinal: Negative for nausea and vomiting.  Genitourinary: Negative for dysuria.  Musculoskeletal: Positive for joint swelling.  Skin: Positive for rash.  Allergic/Immunologic: Negative.  Negative for environmental allergies, food allergies and immunocompromised state.  Neurological: Positive for headaches.  Hematological: Does not  bruise/bleed easily.  Psychiatric/Behavioral: Positive for dysphoric mood. The patient is nervous/anxious.        Objective:   Physical Exam Vitals:   09/18/17 0903  BP: 132/78  Pulse: 74  SpO2: 96%    Estimated body mass index is 23.68 kg/m as calculated from the following:   Height as of 04/23/17: 5\' 10"  (1.778 m).   Weight as of 04/23/17: 165 lb (74.8 kg).  General Appearance:    Alert, cooperative, no distress, appears stated age - yes , sitting on - wheel chair. Sunglass on  Head:    Normocephalic, without obvious abnormality, atraumatic  Eyes:    PERRL, conjunctiva/corneas clear,  Ears:    Normal TM's and external ear canals, both ears  Nose:   Nares normal, septum midline, mucosa normal, no drainage    or sinus tenderness. OXYGEN ON no @ room air  Throat:   Lips, mucosa, and tongue normal; teeth and gums normal. Cyanosis on lips - no  Neck:   Supple, symmetrical, trachea midline, no adenopathy;    thyroid:  no enlargement/tenderness/nodules; no carotid   bruit or JVD  Back:     Symmetric, no curvature, ROM normal, no CVA tenderness  Lungs:     Distress - no , Wheeze no, Barrell Chest - no, Purse lip breathing - no, Crackles - yes bilteral velcroc L base > R base   Chest Wall:    No tenderness or deformity. Scars in chest no   Heart:    Regular rate and rhythm, S1 and S2 normal, no murmur, rub   or gallop  Breast Exam:    NOT DONE  Abdomen:     Soft, non-tender, bowel sounds active all four quadrants,    no masses, no organomegaly  Genitalia:   NOT DONE  Rectal:   NOT DONE  Extremities:   Extremities normal, atraumatic, Clubbing - YES YES, Edema - no  Pulses:   2+ and symmetric all extremities  Skin:   Stigmata of Connective Tissue Disease - no  Lymph nodes:   Cervical, supraclavicular, and axillary nodes normal  Psychiatric:  Neurologic:   Poor historian CNII-XII intact, normal strength, sensation  throughout              Assessment & Plan:     ICD-10-CM    1. Dyspnea, unspecified type R06.00   2. Chronic cough R05   3. ILD (interstitial lung disease) ([place]) J84.9   4. History of smoking Z87.891   5. History of recurrent pneumonia Z87.01   6. Encounter for immunization Z23 Flu vaccine HIGH DOSE PF  7. Shortness of breath R06.02     i'm more concerned that he has interstitial lung disease and also emphysema. His recurrent exacerbations are probably exacerbations as opposed to pneumonia. It is probablethat the pneumonia Is misclassied. It is best to approach this as interstitial lung disease and get a high-resolution CT scan of the chest [last 16 months ago], autoimmune profile and vasculitis profile with hypersensitivity pneumonitis profile and have him and his wife do and interstitial lung disease questionnaire in detail. Also get a  deformity function test  We will review him after all of the above in the interstitial lung disease clinic  Today he will get high dose flu shot for which he is willing      SIGNATURE    Dr. Brand Males, M.D., F.C.C.P,  Pulmonary and Critical Care Medicine Staff Physician, [place] Director - Interstitial Lung Disease  Program  [facility] [place] [facility] [place], [place], [address]  Pager: [phone], If no answer or between  15:00h - 7:00h: call [phone] Telephone: [phone]  9:31 AM 09/18/2017

## 2017-09-18 NOTE — Patient Instructions (Signed)
Dyspnea, unspecified type Chronic cough ILD (interstitial lung disease) ([place]) History of smoking History of recurrent pneumonia   - do HRCT supine/prone - do Pre-bd spiro and dlco only. No lung volume or bd response. No post-bd spiro - Serum: ESR, ACE, ANA, DS-DNA, RF, anti-CCP, ssA, ssB, scl-70, ANCA screen, MPO, PR-3, Total CK,  RNP, Aldolase,  Hypersensitivity Pneumonitis Panel - high dose flu shot  - do ILD questionnaire and bring it back next visit   Followup ILD clinic but after completing all of the above; next few to several weeks

## 2017-09-23 LAB — CK TOTAL AND CKMB (NOT AT ARMC)
CK, MB: 1.2 ng/mL (ref 0–5.0)
Relative Index: 1.7 (ref 0–4.0)
Total CK: 71 U/L (ref 44–196)

## 2017-09-23 LAB — HYPERSENSITIVITY PNUEMONITIS PROFILE
ASPERGILLUS FUMIGATUS: NEGATIVE
FAENIA RETIVIRGULA: NEGATIVE
Pigeon Serum: NEGATIVE
S. VIRIDIS: NEGATIVE
T. CANDIDUS: NEGATIVE
T. VULGARIS: NEGATIVE

## 2017-09-23 LAB — CYCLIC CITRUL PEPTIDE ANTIBODY, IGG: Cyclic Citrullin Peptide Ab: <16 UNITS

## 2017-09-23 LAB — ANCA SCREEN W REFLEX TITER: ANCA Screen: NEGATIVE

## 2017-09-23 LAB — ANA: ANA: NEGATIVE

## 2017-09-23 LAB — MPO/PR-3 (ANCA) ANTIBODIES
Myeloperoxidase Abs: <1 AI
Serine Protease 3: <1 AI

## 2017-09-23 LAB — SJOGREN'S SYNDROME ANTIBODS(SSA + SSB)
SSA (RO) (ENA) ANTIBODY, IGG: NEGATIVE AI
SSB (La) (ENA) Antibody, IgG: <1 AI

## 2017-09-23 LAB — ANGIOTENSIN CONVERTING ENZYME: Angiotensin-Converting Enzyme: 9 U/L (ref 9–67)

## 2017-09-23 LAB — ANTI-DNA ANTIBODY, DOUBLE-STRANDED: ds DNA Ab: <1 IU/mL

## 2017-09-23 LAB — ALDOLASE: Aldolase: 3.5 U/L (ref <=8.1)

## 2017-09-23 LAB — RHEUMATOID FACTOR

## 2017-09-23 LAB — ANTI-SCLERODERMA ANTIBODY: Scleroderma (Scl-70) (ENA) Antibody, IgG: <1 AI

## 2017-09-24 DIAGNOSIS — M4800 Spinal stenosis, site unspecified: Secondary | ICD-10-CM | POA: Diagnosis not present

## 2017-09-24 DIAGNOSIS — H2511 Age-related nuclear cataract, right eye: Secondary | ICD-10-CM | POA: Diagnosis not present

## 2017-09-24 DIAGNOSIS — H40013 Open angle with borderline findings, low risk, bilateral: Secondary | ICD-10-CM | POA: Diagnosis not present

## 2017-09-24 DIAGNOSIS — H2702 Aphakia, left eye: Secondary | ICD-10-CM | POA: Diagnosis not present

## 2017-09-24 DIAGNOSIS — H40031 Anatomical narrow angle, right eye: Secondary | ICD-10-CM | POA: Diagnosis not present

## 2017-09-24 DIAGNOSIS — I10 Essential (primary) hypertension: Secondary | ICD-10-CM | POA: Diagnosis not present

## 2017-09-24 DIAGNOSIS — J449 Chronic obstructive pulmonary disease, unspecified: Secondary | ICD-10-CM | POA: Diagnosis not present

## 2017-09-25 ENCOUNTER — Other Ambulatory Visit: Payer: Self-pay | Admitting: Family Medicine

## 2017-09-26 ENCOUNTER — Encounter (HOSPITAL_COMMUNITY): Payer: Medicare Other

## 2017-09-26 ENCOUNTER — Ambulatory Visit: Payer: Medicare Other | Admitting: Vascular Surgery

## 2017-10-04 ENCOUNTER — Other Ambulatory Visit: Payer: Self-pay

## 2017-10-04 ENCOUNTER — Ambulatory Visit (INDEPENDENT_AMBULATORY_CARE_PROVIDER_SITE_OTHER): Payer: Medicare Other | Admitting: Family Medicine

## 2017-10-04 ENCOUNTER — Encounter: Payer: Self-pay | Admitting: Family Medicine

## 2017-10-04 ENCOUNTER — Ambulatory Visit (HOSPITAL_COMMUNITY)
Admission: RE | Admit: 2017-10-04 | Discharge: 2017-10-04 | Disposition: A | Discharge status: Home / Self Care | Payer: Medicare Other | Source: Ambulatory Visit | Attending: Internal Medicine | Admitting: Internal Medicine

## 2017-10-04 VITALS — BP 130/76 | HR 61 | Temp 98.1°F | Resp 16

## 2017-10-04 DIAGNOSIS — E440 Moderate protein-calorie malnutrition: Secondary | ICD-10-CM

## 2017-10-04 DIAGNOSIS — R911 Solitary pulmonary nodule: Secondary | ICD-10-CM | POA: Diagnosis not present

## 2017-10-04 DIAGNOSIS — I77810 Thoracic aortic ectasia: Secondary | ICD-10-CM | POA: Insufficient documentation

## 2017-10-04 DIAGNOSIS — K5900 Constipation, unspecified: Secondary | ICD-10-CM | POA: Diagnosis not present

## 2017-10-04 DIAGNOSIS — I700 Atherosclerosis of aorta: Secondary | ICD-10-CM | POA: Insufficient documentation

## 2017-10-04 DIAGNOSIS — I10 Essential (primary) hypertension: Secondary | ICD-10-CM | POA: Diagnosis not present

## 2017-10-04 DIAGNOSIS — R590 Localized enlarged lymph nodes: Secondary | ICD-10-CM | POA: Diagnosis not present

## 2017-10-04 DIAGNOSIS — Z8673 Personal history of transient ischemic attack (TIA), and cerebral infarction without residual deficits: Secondary | ICD-10-CM

## 2017-10-04 DIAGNOSIS — I2510 Atherosclerotic heart disease of native coronary artery without angina pectoris: Secondary | ICD-10-CM | POA: Diagnosis not present

## 2017-10-04 DIAGNOSIS — J189 Pneumonia, unspecified organism: Secondary | ICD-10-CM | POA: Diagnosis not present

## 2017-10-04 DIAGNOSIS — R0602 Shortness of breath: Secondary | ICD-10-CM | POA: Insufficient documentation

## 2017-10-04 DIAGNOSIS — R918 Other nonspecific abnormal finding of lung field: Secondary | ICD-10-CM | POA: Diagnosis not present

## 2017-10-04 NOTE — Progress Notes (Signed)
Patient ID: [name], male    DOB: Mar 25, 1934, 83 y.o.   MRN: [id number]  PCP: Alycia Rossetti, MD  Chief Complaint  Patient presents with  . Follow-up    is fasting    Subjective:   [name] is a 83 y.o. male, presents to clinic with CC of for four-month follow-up for protein calorie malnutrition, decreased appetite, constipation, last seen by his PCP for these issues July 03, 2017. He is wheelchair-bound unable to obtain weights on him, his diet currently consists of eating everything his wife cooks for him, he is doing boost drinks, he continues to drink coffee in the morning, still does not eat very much breakfast.  Patient and patient's wife states that his appetite is so much better than it used to be.  Although he does slide to transfer and cannot get on a scale to weigh himself he does feel that he is gained some weight.  He does continue to have constipation only having bowel movement about twice a week.  He is continuing to take medication - amitiza.  He is not trying any other medications.  Since being seen multiple times with his lung problems he has been increasing his fluids and is actively trying to drink much more water, disappointed that still is only about 3 cups of water per day.  Pulmonary doctor appt this week recent blood work done -right reports 8 tubes of blood and does not believe he is due for any more labs  This year has had pneumonia and COPD exacerbation multiple times he is seeing pulmonology for this he continues to have productive sputum that is more than what he reports is his baseline it is yellow to green.  He is seeing pulmonologist tomorrow.  He is currently smoking less than he used to be, half pack a day.  Hypertension -blood pressure is well controlled today, he is taking lisinopril as prescribed no side effects of medication, denies any lower extremity edema, chest pain or pressure, headaches  History of stroke, he tolerates 162 mg of aspirin  daily    Prior to Admission medications   Medication Sig Start Date End Date Taking? Authorizing Provider  albuterol (PROVENTIL HFA;VENTOLIN HFA) 108 (90 Base) MCG/ACT inhaler Inhale 2 puffs into the lungs every 4 (four) hours as needed for wheezing or shortness of breath. 02/27/17  Yes [place], Modena Nunnery, MD  albuterol (PROVENTIL) (2.5 MG/3ML) 0.083% nebulizer solution Take 3 mLs (2.5 mg total) by nebulization every 6 (six) hours as needed for wheezing or shortness of breath. 08/15/17  Yes Delsa Grana, PA-C  AMITIZA 24 MCG capsule TAKE 1 CAPSULE BY MOUTH TWICE DAILY WITH MEALS. 09/25/17  Yes [place], Modena Nunnery, MD  aspirin EC 81 MG tablet Take 162 mg by mouth at bedtime.    Yes [provider]  Cholecalciferol (VITAMIN D-3) 1000 units CAPS TAKE 1 CAPSULE BY MOUTH ONCE DAILY. 05/15/17  Yes [place], Modena Nunnery, MD  citalopram (CELEXA) 20 MG tablet TAKE 1 TABLET BY MOUTH ONCE A DAY FOR DEPRESSION. 02/22/17  Yes [place], Modena Nunnery, MD  donepezil (ARICEPT) 10 MG tablet TAKE (1) TABLET BY MOUTH AT BEDTIME. 05/20/15  Yes Kathrynn Ducking, MD  fluticasone (FLONASE) 50 MCG/ACT nasal spray USE 1 OR 2 SPRAYS IN EACH NOSTRIL EVERY DAY FOR NASAL ALLERGIES. 02/22/17  Yes [place], Modena Nunnery, MD  HYDROcodone-acetaminophen (NORCO/VICODIN) 5-325 MG tablet TAKE 1 TABLET BY MOUTH THREE TIMES DAILY AS NEEDED FOR MODERATE PAIN 06/19/17  Yes  [place], Modena Nunnery, MD  levETIRAcetam (KEPPRA) 250 MG tablet TAKE (1) TABLET BY MOUTH EACH MORNING. 09/18/17  Yes [place], Modena Nunnery, MD  lisinopril (PRINIVIL,ZESTRIL) 10 MG tablet TAKE 1 TABLET BY MOUTH ONCE A DAY FOR HIGH BLOOD PRESSURE 08/17/17  Yes [place], Modena Nunnery, MD  LORazepam (ATIVAN) 1 MG tablet TAKE (1) TABLET BY MOUTH AT BEDTIME. 11/21/16  Yes [place], Modena Nunnery, MD  mirtazapine (REMERON) 15 MG tablet TAKE 1/2 TABLET(7.5MG) BY MOUTH AT BEDTIME FOR APPETITE. 09/18/17  Yes [place], Modena Nunnery, MD  naproxen sodium (ALEVE) 220 MG tablet Take 440 mg by mouth daily as needed.   Yes  [provider]  nitroGLYCERIN (NITROSTAT) 0.4 MG SL tablet Place 0.4 mg under the tongue every 5 (five) minutes as needed for chest pain.   Yes [provider]  omeprazole (PRILOSEC) 20 MG capsule TAKE 1 CAPSULE BY MOUTH ONCE DAILY FOR RELFUX ESOPHAGITIS OR STOMACH ULCERS. 09/18/17  Yes [place], Modena Nunnery, MD  Respiratory Therapy Supplies (NEBULIZER/TUBING/MOUTHPIECE) KIT Disp one nebulizer machine, tubing set and mouthpiece kit 08/17/17  Yes Delsa Grana, PA-C  traZODone (DESYREL) 100 MG tablet TAKE (1) TABLET BY MOUTH AT BEDTIME. 08/17/17  Yes [place], Modena Nunnery, MD  VESICARE 5 MG tablet TAKE ONE TABLET ONCE DAILY FOR BLADDER. 09/18/17  Yes [place], Modena Nunnery, MD     Allergies  Allergen Reactions  . Aspirin Nausea And Vomiting    Tolerates 64m daily     Review of Systems  Constitutional: Negative for activity change, appetite change, chills, diaphoresis, fatigue, fever and unexpected weight change.  HENT: Negative.   Eyes: Negative.   Respiratory: Positive for cough.   Cardiovascular: Negative for chest pain.  Gastrointestinal: Positive for constipation. Negative for abdominal distention, abdominal pain, anal bleeding, blood in stool, diarrhea, nausea and vomiting.  Endocrine: Negative.   Genitourinary: Negative.   Musculoskeletal: Negative.   Skin: Negative.   Allergic/Immunologic: Negative.   Neurological: Negative.  Negative for syncope and weakness.  Hematological: Negative.   All other systems reviewed and are negative.      Objective:    Vitals:   10/04/17 0949  BP: 130/76  Pulse: 61  Resp: 16  Temp: 98.1 F (36.7 C)  TempSrc: Oral  SpO2: 98%      Physical Exam  Constitutional: He is oriented to person, place, and time. He appears well-developed and well-nourished.  Non-toxic appearance. He does not appear ill. No distress.  Elderly AAM, seated in wheelchair, non-toxic, well-appearing, alert  HENT:  Head: Normocephalic and atraumatic.  Right  Ear: Tympanic membrane, external ear and ear canal normal.  Left Ear: Tympanic membrane, external ear and ear canal normal.  Nose: No mucosal edema or rhinorrhea.  Mouth/Throat: Uvula is midline and oropharynx is clear and moist. No trismus in the jaw. No uvula swelling. No oropharyngeal exudate, posterior oropharyngeal edema or posterior oropharyngeal erythema.  MMM, no erythema  Eyes: Pupils are equal, round, and reactive to light. Conjunctivae, EOM and lids are normal.  Neck: Trachea normal, normal range of motion and phonation normal. Neck supple. No tracheal deviation present.  Cardiovascular: Regular rhythm and normal pulses. Exam reveals distant heart sounds. Exam reveals no gallop and no friction rub.  No murmur heard. Pulses:      Radial pulses are 2+ on the right side, and 2+ on the left side.  Pulmonary/Chest: Effort normal. No accessory muscle usage or stridor. No tachypnea. No respiratory distress. He has no decreased breath sounds. He has no rhonchi.  Abdominal: Soft. Normal appearance and bowel sounds are normal. He exhibits no distension and no mass. There is no tenderness. There is no guarding.  Musculoskeletal: He exhibits no edema.  Neurological: He is alert and oriented to person, place, and time. Gait normal.  Skin: Skin is warm, dry and intact. Capillary refill takes less than 2 seconds. No rash noted. He is not diaphoretic. No cyanosis. No pallor. Nails show clubbing.  Psychiatric: He has a normal mood and affect. His speech is normal and behavior is normal.  Nursing note and vitals reviewed.         Assessment & Plan:      ICD-10-CM   1. Essential hypertension I10 CBC with Differential/Platelet    COMPLETE METABOLIC PANEL WITH GFR  2. Constipation, unspecified constipation type K59.00   3. Moderate protein-calorie malnutrition (HCC) E44.0 CBC with Differential/Platelet    COMPLETE METABOLIC PANEL WITH GFR  4. History of stroke Z86.73 CBC with  Differential/Platelet    COMPLETE METABOLIC PANEL WITH GFR    Lipid panel    Patient is here for routine follow-up of constipation, protein calorie malnutrition.  Constipation is minimally improved with Amitiza, encouraged to increase fluids, can increase Amitiza to twice daily  Moderate protein calorie malnutrition, the wife and the patient states this has improved his appetite is better, he continues to supplement with boost.  Will obtain CMP to recheck protein, he appears to have gained weight although I am unable to weigh him being wheelchair-bound.  He is continuing to take Remeron at night to help with appetite.  His wife states that he should not be due for any labs today however in reviewing what lab work was done appears to be of fairly specialized work-up for pulmonology and I do not see any routine lab work since April 2019 so we will obtain basic lab work including CBC, CMP and lipid panel.  Blood pressures well controlled, continue current medication    Delsa Grana, PA-C 10/04/17 10:17 AM

## 2017-10-05 LAB — CBC WITH DIFFERENTIAL/PLATELET
Basophils Absolute: 72 cells/uL (ref 0–200)
Basophils Relative: 1.3 %
Eosinophils Absolute: 578 cells/uL — ABNORMAL HIGH (ref 15–500)
Eosinophils Relative: 10.5 %
HCT: 42.3 % (ref 38.5–50.0)
Hemoglobin: 14.2 g/dL (ref 13.2–17.1)
Lymphs Abs: 2206 cells/uL (ref 850–3900)
MCH: 30.2 pg (ref 27.0–33.0)
MCHC: 33.6 g/dL (ref 32.0–36.0)
MCV: 90 fL (ref 80.0–100.0)
MPV: 10.5 fL (ref 7.5–12.5)
Monocytes Relative: 9.7 %
Neutro Abs: 2112 cells/uL (ref 1500–7800)
Neutrophils Relative %: 38.4 %
Platelets: 176 10*3/uL (ref 140–400)
RBC: 4.7 10*6/uL (ref 4.20–5.80)
RDW: 12.8 % (ref 11.0–15.0)
Total Lymphocyte: 40.1 %
WBC mixed population: 534 cells/uL (ref 200–950)
WBC: 5.5 10*3/uL (ref 3.8–10.8)

## 2017-10-05 LAB — COMPLETE METABOLIC PANEL WITH GFR
AG RATIO: 1.2 (calc) (ref 1.0–2.5)
ALBUMIN MSPROF: 3.8 g/dL (ref 3.6–5.1)
ALT: 6 U/L — AB (ref 9–46)
AST: 14 U/L (ref 10–35)
Alkaline phosphatase (APISO): 97 U/L (ref 40–115)
BILIRUBIN TOTAL: 0.6 mg/dL (ref 0.2–1.2)
BUN: 14 mg/dL (ref 7–25)
CHLORIDE: 105 mmol/L (ref 98–110)
CO2: 25 mmol/L (ref 20–32)
Calcium: 9.2 mg/dL (ref 8.6–10.3)
Creat: 0.92 mg/dL (ref 0.70–1.11)
GFR, EST NON AFRICAN AMERICAN: 78 mL/min/{1.73_m2} (ref >=60)
GFR, Est African American: 90 mL/min/{1.73_m2} (ref >=60)
Globulin: 3.2 g/dL (calc) (ref 1.9–3.7)
Glucose, Bld: 94 mg/dL (ref 65–99)
POTASSIUM: 4.2 mmol/L (ref 3.5–5.3)
Sodium: 138 mmol/L (ref 135–146)
TOTAL PROTEIN: 7 g/dL (ref 6.1–8.1)

## 2017-10-05 LAB — LIPID PANEL
Cholesterol: 158 mg/dL (ref <200)
HDL: 52 mg/dL (ref >40)
LDL Cholesterol (Calc): 85 mg/dL (calc)
Non-HDL Cholesterol (Calc): 106 mg/dL (calc) (ref <130)
TRIGLYCERIDES: 113 mg/dL (ref <150)
Total CHOL/HDL Ratio: 3 (calc) (ref <5.0)

## 2017-10-08 ENCOUNTER — Encounter: Payer: Self-pay | Admitting: *Deleted

## 2017-10-09 ENCOUNTER — Ambulatory Visit: Payer: Medicare Other | Admitting: Vascular Surgery

## 2017-10-09 ENCOUNTER — Encounter (HOSPITAL_COMMUNITY): Payer: Medicare Other

## 2017-10-11 ENCOUNTER — Encounter: Payer: Self-pay | Admitting: Internal Medicine

## 2017-10-11 ENCOUNTER — Ambulatory Visit (INDEPENDENT_AMBULATORY_CARE_PROVIDER_SITE_OTHER): Payer: Medicare Other | Admitting: Internal Medicine

## 2017-10-11 VITALS — BP 142/84 | HR 67 | Ht 69.0 in | Wt 170.0 lb

## 2017-10-11 DIAGNOSIS — J449 Chronic obstructive pulmonary disease, unspecified: Secondary | ICD-10-CM | POA: Diagnosis not present

## 2017-10-11 DIAGNOSIS — R053 Chronic cough: Secondary | ICD-10-CM

## 2017-10-11 DIAGNOSIS — Z87891 Personal history of nicotine dependence: Secondary | ICD-10-CM

## 2017-10-11 DIAGNOSIS — J84112 Idiopathic pulmonary fibrosis: Secondary | ICD-10-CM

## 2017-10-11 DIAGNOSIS — R05 Cough: Secondary | ICD-10-CM | POA: Diagnosis not present

## 2017-10-11 DIAGNOSIS — R0602 Shortness of breath: Secondary | ICD-10-CM

## 2017-10-11 DIAGNOSIS — R0600 Dyspnea, unspecified: Secondary | ICD-10-CM

## 2017-10-11 DIAGNOSIS — R059 Cough, unspecified: Secondary | ICD-10-CM

## 2017-10-11 DIAGNOSIS — J849 Interstitial pulmonary disease, unspecified: Secondary | ICD-10-CM

## 2017-10-11 LAB — PULMONARY FUNCTION TEST
DL/VA % pred: 92 %
DL/VA: 4.2 ml/min/mmHg/L
DLCO COR % PRED: 43 %
DLCO UNC: 13.3 ml/min/mmHg
DLCO cor: 13.45 ml/min/mmHg
DLCO unc % pred: 42 %
FEF 25-75 Pre: 1.64 L/sec
FEF2575-%PRED-PRE: 85 %
FEV1-%PRED-PRE: 76 %
FEV1-PRE: 1.92 L
FEV1FVC-%Pred-Pre: 101 %
FEV6-%Pred-Pre: 78 %
FEV6-Pre: 2.56 L
FEV6FVC-%PRED-PRE: 106 %
FVC-%PRED-PRE: 73 %
FVC-PRE: 2.56 L
PRE FEV1/FVC RATIO: 75 %
Pre FEV6/FVC Ratio: 100 %

## 2017-10-11 MED ORDER — ALBUTEROL SULFATE (2.5 MG/3ML) 0.083% IN NEBU: 2.5000 mg | INHALATION_SOLUTION | Freq: Four times a day (QID) | RESPIRATORY_TRACT | 1 refills | Status: DC | PRN

## 2017-10-11 NOTE — Patient Instructions (Addendum)
ICD-10-CM   1. IPF (idiopathic pulmonary fibrosis) ([place]) J84.112   2. History of smoking Z87.891     Re IPF - Course   - progressive disease in almost all patients (> 90%); typically few to several year progression   - in your case, there is 11% decline in lung functon since spring 2019 through 10/11/2017   - Rx:  anti-fibrotics +  -There are 2 oral pills in the market since 2014; one is called Pirfenidone (Esbriet) and the other OFEV  -  they can only slow disease down; preventative.   - they do not Not Rx symptoms  - most 55-65% patients tolerate them well  - but around 1/3rd of patient suffer from side effects predominantly gastrointestinal such as nausea, vomiting, diarrhea, weight loss and low appetite  -Between the 2 medications I do not recommend Ofev because he is wheelchair-bound and if it causes diarrhea he will not be able to cope  -I am also concerned that you might not tolerate Pirfenidone (Esbriet) because of ongoing nausea and weight loss and this drug can make worse both situations particularly in people over 80  -In addition if you have ongoing smoking these medicines are less effective  PLAN/REcommendation (after extensive discussion]  -Test overnight oxygen ; and if the oxygen is dropping we can start this and this can potentially help your cough -Start albuterol nebulizer as needed -Between the 2 medications you are welcome to consider Pirfenidone (Esbriet)  -Please take product information about Pirfenidone (Esbriet)  -With Pirfenidone (Esbriet) you have to take 3 pills 3 times daily with food and you are to space them apart by 5 hours  -You are to wear sunscreen when you go out  -He will have to have frequent liver function test monitoring -Please also take information sheet about the disease -The other alternative if you do not want to do these oral medication for the IPF disease or if you try it and it fails because of side effects, then you can consider  research protocol with the IV infusion given every 3 weeks at [facility]  -Please note that because of the recent study this is a drug and development phase but so far the results and side effect profile is very promising and there is a one third chance you end up with placebo  -Typically research visits and study procedures will be paid for with a stipend and trip reimbursed and study drug comes at no cost  - You can discuss with your family member Indie who is a  Equities trader   - Quit smoking  Follow-up -ILD clinic in 1 month; you can bring the family with you for this visit

## 2017-10-11 NOTE — Progress Notes (Signed)
PFT completed today. 10/11/17 

## 2017-10-11 NOTE — Progress Notes (Signed)
HPI Chief Complaint  Patient presents with  . Consult    Referred by Verline Lema for pna.  Pt had a cxr performed 8/15 and also had a PFT performed 1/24. Pt states he has some occ CP and does a lot of cough that is worse late in the afternoon that he gets up little white phlegm.      [name] , 83 y.o. , with dob 07/21/1934 and male ,Not Hispanic or Latino from [address] [place] [address] - presents to lung clinic for evaluation of a history of recurrent pneumonias. History is mostly given by the wife but also review of the review of chart. Patient is 83 has had a stroke in 2002and is essentially ECOG 4 with good and her mental status to do basic history but is dependent for activities of daily living. He can self transfer and can have control of his bladder and bowel but needs help with his clothing. He is able to swallow without any difficulty According to the wife and him he's had recurrent pneumonia since December 2019. Typical symptoms arecough with increased sputum production and shortness of breath and chest tightness. In a few of these he has had fever. Each time treated with antibiotic and prednisone with resolution. Last episode was end of August 2019.One of the episodes he was admitted. He had a CT scan of the chest in March 2019 that I personally visualized. The radiologist as reported pneumonia but in my personal visualization I feel that he has bilateral left greater than right bibasilar honeycombing and also upper lobe groundglass opacities more concerning for interstitial lung disease. [A comparison 2014 abdominal CT lung cut did not have these findings] he did have a chest x-ray in August 2019 is reported as pneumonia but to me looks like he has left lower lobe atelectasischronic lung scarring. Given his extreme sedentary lifestyle is unclear how much baseline shortness of breath and cough he has but he does have those.  ILD exposure history: He is ongoing  smoking history has had difficulty quitting. 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 Denies any mold exposure in the house. Denies any connective tissue disease.Does have acid reflux.  His pulmonary function test in January 2019 shows a mixture of restriction and normal total lung capacity associated with significant reduction in diffusion capacity suggestive of a mixed emphysema fibrosis pattern    OV 10/11/2017  Subjective:  Patient ID: [name], male , DOB: [birth year] , age 83 y.o. , MRN: [id number] , ADDRESS: [address] [place] [address]   10/11/2017 -   Chief Complaint  Patient presents with  . Follow-up    feeling better since last visit - had PFT today     HPI [name] 83 y.o. -presents with his wife and stepson to discuss his ILD work-up.  He had pulmonary function test that shows a 10% - 11% decline in [facility] since January 2019.  He had autoimmune and vasculitis and serology panel for connective tissue disease and this is negative.  Both documented below.  He had high-resolution CT chest October 05, 2017 that I personally visualized and agree with the thoracic radiology opinion of definitive UIP.  He continues to smoke    [employer] Comprehensive ILD Questionnaire Symptoms: He has gradual onset of shortness of breath for the last 18 months.  It is not any worse but then we do not know any better  because he is wheelchair-bound mostly.  He has level 2 dyspnea for eating or brushing the teeth and level 4 for standing up from the chair and level 5 for dressing feeling that he is unable to give any information about shortness of breath.  He has inability to stand or walk following a stroke.  He has a cough since May 2018 he coughs at night he brings up some sputum is the same since it started it is worse when he lies down he feels air hungry when he coughs and it does upset him.  He also has occasional wheezing.  He also has pelvic soreness on a  level 7.  This happens when he tries to sit up or change position  Past medical history: He has chronic arthritis for the last several years.  He thinks he has COPD but this has not been seen in the CT scan in terms of emphysema.  He denies any connective tissue disease.  History is positive for acid reflux disease and also obstructive sleep apnea for the last several years.  He had a stroke several years ago and is on wheelchair.  Also has history of seizures following the stroke.  But no thyroid disease or diabetes or tuberculosis.  There is a history of recurrent pneumonias but this may be inaccurate and might actually reflect IPF or ILD exacerbations  Review of systems: Positive for fatigue arthralgia mild dysphagia but no Raynaud.  His weight is unknown because he cannot stand.  His review of systems positive for acid reflux and snoring and periodic yeast infection in his lower abdomen.  Most important thing in review of system is that he has weight loss and then also has nausea periodically at night sometimes resulting in admissions.  He does not have diarrhea but if he has diarrhea he will not be able to mobilize himself because of his wheelchair existence  Family history of lung disease: No fibrosis but positive for COPD  Exposure history: He continues to smoke since 1949 83 to 8 cigarettes/day.  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  Also grew tobacco prior to that for many years and sometimes it was damp.  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  He is worked in [place] is exposed to dusty environment  Pulmonary toxicity history: Extensive list of medications causing ILD reviewed positive only for  prednisone for a brief period of time in August 2019       Results for Benjamin, Vaughan (MRN [id number]) as of 10/11/2017 15:12  Ref. Range 01/25/2017 08:39 10/11/2017 13:59  FVC-Pre Latest Units: L 2.69 2.56  FVC-%Pred-Pre Latest Units: % 77 73  Results for Benjamin, Vaughan (MRN [id number]) as of 10/11/2017 15:12  Ref. Range 01/25/2017 08:39 10/11/2017 13:59  DLCO unc Latest Units: ml/min/mmHg 14.41 13.30  DLCO unc % pred Latest Units: % 46 42  Results for Benjamin, Vaughan (MRN [id number]) as of 10/11/2017 15:12  Ref. Range 09/18/2017 09:58  ASPERGILLUS FUMIGATUS Latest Ref Range: NEGATIVE  NEGATIVE  Pigeon Serum Latest Ref Range: NEGATIVE  NEGATIVE  Anti Nuclear Antibody(ANA) Latest Ref Range: NEGATIVE  NEGATIVE  Angiotensin-Converting Enzyme Latest Ref Range: 9 - 67 U/L 9  Cyclic Citrullin Peptide Ab Latest Units: UNITS <16  ds DNA Ab Latest Units: IU/mL <1  Myeloperoxidase Abs Latest Units: AI <1.0  Serine Protease 3 Latest Units: AI <1.0  RA Latex Turbid. Latest Ref Range: <14 IU/mL <14  SSA (Ro) (ENA) Antibody, IgG Latest Ref Range: <1.0 NEG AI <1.0 NEG  SSB (La) (ENA) Antibody, IgG Latest Ref Range: <1.0 NEG AI <1.0 NEG  Scleroderma (Scl-70) (ENA) Antibody, IgG Latest Ref Range: <1.0 NEG AI <1.0 NEG    IMPRESSION: CT chest high resolution 1. Spectrum of findings compatible with basilar predominant fibrotic interstitial lung disease with mild-to-moderate honeycombing, possibly slightly progressed since 03/04/2017 chest CT angiogram study. Findings are considered usual interstitial pneumonia (UIP) pattern per ATS consensus criteria. 2. Seven month stability of 8 mm solid superior segment left lower lobe pulmonary nodule, more likely benign. Suggest attention on follow-up chest CT in 12 months. 3. Ectatic 4.2 cm ascending thoracic aorta, stable. Recommend annual imaging followup by CTA or MRA. This recommendation follows 2010 ACCF/AHA/AATS/ACR/ASA/SCA/SCAI/SIR/STS/SVM Guidelines for  the Diagnosis and Management of Patients with Thoracic Aortic Disease. Circulation. 2010; 121: K812-X517. 4. Three-vessel coronary atherosclerosis. 5. Mild mediastinal adenopathy is stable, most suggestive of benign reactive adenopathy.  Aortic Atherosclerosis (ICD10-I70.0).   Electronically Signed   By: Ilona Sorrel M.D.   On: 10/05/2017 09:43  ROS - per HPI     has a past medical history of Abnormality of gait (08/01/2013), Arthritis, Blindness of left eye, Carotid artery disease ([place]), Colon polyp, Constipation, Diaphragmatic hernia, Disc disease, degenerative, cervical, Diverticulitis, ED (erectile dysfunction), Gastroparesis, GERD (gastroesophageal reflux disease), Glaucoma, Heart disease, Hyperlipidemia, Hypertension, Insomnia, Memory difficulty (08/04/2014), Nicotine dependence, Numbness and tingling of right arm (08/04/2014), Osteoarthritis, Overactive bladder, Radiculopathy, Shortness of breath, Sleep apnea, Stroke ([place]), and Unilateral inguinal hernia.   reports that he has been smoking cigarettes. He has a 30.00 pack-year smoking history. He has never used smokeless tobacco.  Past Surgical History:  Procedure Laterality Date  . ANTERIOR CERVICAL DECOMP/DISCECTOMY FUSION N/A 07/01/2012   Procedure: ANTERIOR CERVICAL DECOMPRESSION/DISCECTOMY FUSION CERVICAL FIVE-SIX [name] REMOVAL CERVICAL SIX-SEVEN;  Surgeon: [name], MD;  Location: [facility];  Service: Neurosurgery;  Laterality: N/A;  . CERVICAL FUSION    . COLONOSCOPY   08/04/2002     RMR: Normal rectum/Pancolonic diverticula/Colonic polyps as described above, biopsied and/or snared  . COLONOSCOPY  60/08/2009   RMR: normal rectum/left and right sided diverticula/multiple colonic polyps. tubular adenomas. surveillance due 2014  . COLONOSCOPY WITH ESOPHAGOGASTRODUODENOSCOPY (EGD) N/A 12/18/2012   Dr. [name]:  Colonic diverticulosis. Multiple colonic polyps-hyperplastic polyps and tubular adenoma. Friable anal canal  hemorrhoids. EGD with chronic atrophic gastritis  . ESOPHAGOGASTRODUODENOSCOPY  04/14/2002   GYF:VCBSWHQP'R' ring, status post dilation as described above/Hiatal hernia, focal antral erosions of uncertain clinical significance  . ESOPHAGOGASTRODUODENOSCOPY  06/09/2009   RMR: normal esophagus s/p dilator/small hiatal hernia otherwise normal  . HERNIA REPAIR Right    inguinal  . I&D EXTREMITY Right 04/23/2017   Procedure: IRRIGATION AND DEBRIDEMENT HAND;  Surgeon: [name], MD;  Location: [facility];  Service: Orthopedics;  Laterality: Right;  . INGUINAL HERNIA REPAIR Left 04/15/2014   Procedure: LEFT INGUINAL HERNIORRHAPHY WITH MESH;  Surgeon: [name], MD;  Location: [facility];  Service: General;  Laterality: Left;  . INSERTION OF MESH Left 04/15/2014   Procedure: INSERTION OF MESH;  Surgeon: [name], MD;  Location: [facility];  Service: General;  Laterality: Left;    Allergies  Allergen Reactions  .  Aspirin Nausea And Vomiting    Tolerates 33m daily    Immunization History  Administered Date(s) Administered  . Influenza, High Dose Seasonal PF 09/18/2017  . Influenza,inj,Quad PF,6+ Mos 10/02/2016  . Pneumococcal Conjugate-13 02/02/2017  . Pneumococcal Polysaccharide-23 11/14/1995  . Tdap 04/22/2017    Family History  Problem Relation Age of Onset  . Cancer Mother   . Dementia Father   . Colon cancer Neg Hx      Current Outpatient Medications:  .  albuterol (PROVENTIL HFA;VENTOLIN HFA) 108 (90 Base) MCG/ACT inhaler, Inhale 2 puffs into the lungs every 4 (four) hours as needed for wheezing or shortness of breath., Disp: 1 Inhaler, Rfl: 0 .  albuterol (PROVENTIL) (2.5 MG/3ML) 0.083% nebulizer solution, Take 3 mLs (2.5 mg total) by nebulization every 6 (six) hours as needed for wheezing or shortness of breath., Disp: 150 mL, Rfl: 1 .  AMITIZA 24 MCG capsule, TAKE 1 CAPSULE BY MOUTH TWICE DAILY WITH MEALS., Disp: 60 capsule, Rfl: 0 .  aspirin EC 81 MG tablet, Take 162  mg by mouth at bedtime. , Disp: , Rfl:  .  Cholecalciferol (VITAMIN D-3) 1000 units CAPS, TAKE 1 CAPSULE BY MOUTH ONCE DAILY., Disp: 30 capsule, Rfl: 11 .  citalopram (CELEXA) 20 MG tablet, TAKE 1 TABLET BY MOUTH ONCE A DAY FOR DEPRESSION., Disp: 90 tablet, Rfl: 3 .  donepezil (ARICEPT) 10 MG tablet, TAKE (1) TABLET BY MOUTH AT BEDTIME., Disp: 90 tablet, Rfl: 0 .  fluticasone (FLONASE) 50 MCG/ACT nasal spray, USE 1 OR 2 SPRAYS IN EACH NOSTRIL EVERY DAY FOR NASAL ALLERGIES., Disp: 48 g, Rfl: 3 .  HYDROcodone-acetaminophen (NORCO/VICODIN) 5-325 MG tablet, TAKE 1 TABLET BY MOUTH THREE TIMES DAILY AS NEEDED FOR MODERATE PAIN, Disp: 90 tablet, Rfl: 0 .  levETIRAcetam (KEPPRA) 250 MG tablet, TAKE (1) TABLET BY MOUTH EACH MORNING., Disp: 30 tablet, Rfl: 0 .  lisinopril (PRINIVIL,ZESTRIL) 10 MG tablet, TAKE 1 TABLET BY MOUTH ONCE A DAY FOR HIGH BLOOD PRESSURE, Disp: 30 tablet, Rfl: 0 .  LORazepam (ATIVAN) 1 MG tablet, TAKE (1) TABLET BY MOUTH AT BEDTIME., Disp: 30 tablet, Rfl: 1 .  mirtazapine (REMERON) 15 MG tablet, TAKE 1/2 TABLET(7.5MG) BY MOUTH AT BEDTIME FOR APPETITE., Disp: 15 tablet, Rfl: 0 .  naproxen sodium (ALEVE) 220 MG tablet, Take 440 mg by mouth daily as needed., Disp: , Rfl:  .  nitroGLYCERIN (NITROSTAT) 0.4 MG SL tablet, Place 0.4 mg under the tongue every 5 (five) minutes as needed for chest pain., Disp: , Rfl:  .  omeprazole (PRILOSEC) 20 MG capsule, TAKE 1 CAPSULE BY MOUTH ONCE DAILY FOR RELFUX ESOPHAGITIS OR STOMACH ULCERS., Disp: 30 capsule, Rfl: 0 .  Respiratory Therapy Supplies (NEBULIZER/TUBING/MOUTHPIECE) KIT, Disp one nebulizer machine, tubing set and mouthpiece kit, Disp: 1 each, Rfl: 0 .  traZODone (DESYREL) 100 MG tablet, TAKE (1) TABLET BY MOUTH AT BEDTIME., Disp: 30 tablet, Rfl: 0 .  VESICARE 5 MG tablet, TAKE ONE TABLET ONCE DAILY FOR BLADDER., Disp: 30 tablet, Rfl: 0      Objective:   Vitals:   10/11/17 1507  BP: (!) 142/84  Pulse: 67  SpO2: 99%  Weight: 170 lb  (77.1 kg)  Height: 5' 9"  (1.753 m)    Estimated body mass index is 25.1 kg/m as calculated from the following:   Height as of this encounter: 5' 9"  (1.753 m).   Weight as of this encounter: 170 lb (77.1 kg).  @WEIGHTCHANGE @  Filed Weights   10/11/17 1507  Weight:  170 lb (77.1 kg)     Physical Exam  Mainly discussion visit: He is a wheelchair-bound male who is alert and oriented.  Cognitively intact and understands conversation but slow to respond with some slurred speech.  He has bilateral bibasal Velcro crackles with craniocaudal gradient.  His significant clubbing.  No stigmata of connective tissue disease.         Assessment:       ICD-10-CM   1. IPF (idiopathic pulmonary fibrosis) ([place]) J84.112   2. History of smoking Z87.891    A diagnosis of IPF has been given because of male over 44 with presence of clubbing and Velcro crackles with a craniocaudal gradient and definitive UIP on the CT chest with negative serology and limited hypersensitivity pneumonitis panel being negative.  The course of IPF was discussed in detail  Anti-fibrotic therapy was discussed.  Nintedanib is not a choice because of the risk of diarrhea and him being on wheelchair.  Aspirate would be the first choice but then it can make his nausea worse.  He and his wife are going to talk to a family member who is a Marine scientist.  They are going to see if they are willing to take a risk because the odds are approximately one third of the side effects happening and the side effects are reversible.  After this discussion they will let us know which way they want to go.  They do know that if he develops side effects with oral approved anti-fibrotic's he might be candidate for IV monoclonal antibody against connective tissue growth factor in in a research protocol phase 3 ; 33% chance of placebo      Plan:     Patient Instructions     ICD-10-CM   1. IPF (idiopathic pulmonary fibrosis) ([place]) J84.112   2. History  of smoking Z87.891     Re IPF - Course   - progressive disease in almost all patients (> 90%); typically few to several year progression   - in your case, there is 11% decline in lung functon since spring 2019 through 10/11/2017   - Rx:  anti-fibrotics +  -There are 2 oral pills in the market since 2014; one is called Pirfenidone (Esbriet) and the other OFEV  -  they can only slow disease down; preventative.   - they do not Not Rx symptoms  - most 55-65% patients tolerate them well  - but around 1/3rd of patient suffer from side effects predominantly gastrointestinal such as nausea, vomiting, diarrhea, weight loss and low appetite  -Between the 2 medications I do not recommend Ofev because he is wheelchair-bound and if it causes diarrhea he will not be able to cope  -I am also concerned that you might not tolerate Pirfenidone (Esbriet) because of ongoing nausea and weight loss and this drug can make worse both situations particularly in people over 80  -In addition if you have ongoing smoking these medicines are less effective  PLAN/REcommendation (after extensive discussion]  -Test overnight oxygen ; and if the oxygen is dropping we can start this and this can potentially help your cough -Start albuterol nebulizer as needed -Between the 2 medications you are welcome to consider Pirfenidone (Esbriet)  -Please take product information about Pirfenidone (Esbriet)  -With Pirfenidone (Esbriet) you have to take 3 pills 3 times daily with food and you are to space them apart by 5 hours  -You are to wear sunscreen when you go out  -He will have to have  frequent liver function test monitoring -Please also take information sheet about the disease -The other alternative if you do not want to do these oral medication for the IPF disease or if you try it and it fails because of side effects, then you can consider research protocol with the IV infusion given every 3 weeks at [facility]  -Please note that because of the recent study this is a drug and development phase but so far the results and side effect profile is very promising and there is a one third chance you end up with placebo  -Typically research visits and study procedures will be paid for with a stipend and trip reimbursed and study drug comes at no cost  - You can discuss with your family member Wirt who is a  Equities trader   - Quit smoking  Follow-up -ILD clinic in 1 month; you can bring the family with you for this visit   > 50% of this > 40 min visit spent in face to face counseling or/and coordination of care - by this undersigned MD - Dr Brand Males. This includes one or more of the following documented above: discussion of test results, diagnostic or treatment recommendations, prognosis, risks and benefits of management options, instructions, education, compliance or risk-factor reduction   SIGNATURE    Dr. Brand Males, M.D., F.C.C.P,  Pulmonary and Critical Care Medicine Staff Physician, [place] Director - Interstitial Lung Disease  Program  P[facility] [place], [place], [address]  Pager: [phone], If no answer or between  15:00h - 7:00h: call [phone] Telephone: [phone]  3:56 PM 10/11/2017

## 2017-10-14 ENCOUNTER — Encounter: Payer: Self-pay | Admitting: Internal Medicine

## 2017-10-14 DIAGNOSIS — J84112 Idiopathic pulmonary fibrosis: Secondary | ICD-10-CM | POA: Diagnosis not present

## 2017-10-17 DIAGNOSIS — J449 Chronic obstructive pulmonary disease, unspecified: Secondary | ICD-10-CM | POA: Diagnosis not present

## 2017-10-17 DIAGNOSIS — J189 Pneumonia, unspecified organism: Secondary | ICD-10-CM | POA: Diagnosis not present

## 2017-10-17 DIAGNOSIS — R062 Wheezing: Secondary | ICD-10-CM | POA: Diagnosis not present

## 2017-10-18 ENCOUNTER — Other Ambulatory Visit: Payer: Self-pay | Admitting: Family Medicine

## 2017-10-18 NOTE — Telephone Encounter (Signed)
Ok to refill Ativan??  Last office visit 10/04/2017.  Last refill 11/21/2016, #1 refills.

## 2017-10-23 DIAGNOSIS — J449 Chronic obstructive pulmonary disease, unspecified: Secondary | ICD-10-CM | POA: Diagnosis not present

## 2017-10-23 DIAGNOSIS — J84112 Idiopathic pulmonary fibrosis: Secondary | ICD-10-CM | POA: Diagnosis not present

## 2017-10-24 DIAGNOSIS — M4800 Spinal stenosis, site unspecified: Secondary | ICD-10-CM | POA: Diagnosis not present

## 2017-10-24 DIAGNOSIS — I10 Essential (primary) hypertension: Secondary | ICD-10-CM | POA: Diagnosis not present

## 2017-10-24 DIAGNOSIS — J449 Chronic obstructive pulmonary disease, unspecified: Secondary | ICD-10-CM | POA: Diagnosis not present

## 2017-11-01 ENCOUNTER — Encounter: Payer: Self-pay | Admitting: Internal Medicine

## 2017-11-13 ENCOUNTER — Ambulatory Visit: Payer: Medicare Other | Admitting: Internal Medicine

## 2017-11-13 ENCOUNTER — Ambulatory Visit (INDEPENDENT_AMBULATORY_CARE_PROVIDER_SITE_OTHER): Payer: Medicare Other | Admitting: Vascular Surgery

## 2017-11-13 ENCOUNTER — Ambulatory Visit (HOSPITAL_COMMUNITY)
Admission: RE | Admit: 2017-11-13 | Discharge: 2017-11-13 | Disposition: A | Discharge status: Home / Self Care | Payer: Medicare Other | Source: Ambulatory Visit | Attending: Vascular Surgery | Admitting: Vascular Surgery

## 2017-11-13 ENCOUNTER — Other Ambulatory Visit: Payer: Self-pay

## 2017-11-13 ENCOUNTER — Encounter: Payer: Self-pay | Admitting: Vascular Surgery

## 2017-11-13 VITALS — BP 177/92 | HR 68 | Temp 97.7°F | Resp 16 | Ht 69.0 in | Wt 170.0 lb

## 2017-11-13 DIAGNOSIS — I6523 Occlusion and stenosis of bilateral carotid arteries: Secondary | ICD-10-CM

## 2017-11-13 NOTE — Progress Notes (Signed)
Patient name: [name] MRN: [id number] DOB: [birth year] Sex: male  REASON FOR VISIT: Carotid surveillance with six-month follow-up  HPI: [name] is a 83 y.o. male with multiple medical problems including prior cerebellar CVA in 2002, pulmonary fibrosis, hypertension, hyperlipidemia, tobacco abuse that presents for interval six-month follow-up for surveillance of his carotid disease.  He was previously seen by Dr. Bridgett Larsson 6 months ago and at that time had no significant disease in his carotids and was recommended for 35-monthinterval follow-up.  On evaluation today his only complaint is some right hand numbness at the base of the fourth digit where he recently had hand surgery.  He denies any ocular blindness aphasia or other focal neurologic symptoms like numbness or weakness aside from his right fourth finger.  He states this finger numbness started after his surgery and t has been persistent.  He does continue to use pack cigarettes every 2 to 3 days.  He does take an aspirin.  He is in a wheelchair at all times and non-ambulatory.  Past Medical History:  Diagnosis Date  . Abnormality of gait 08/01/2013  . Arthritis   . Blindness of left eye    Posttraumatic  . Carotid artery disease ([place]   . Colon polyp   . Constipation   . Diaphragmatic hernia   . Disc disease, degenerative, cervical   . Diverticulitis   . ED (erectile dysfunction)   . Gastroparesis   . GERD (gastroesophageal reflux disease)   . Glaucoma   . Heart disease   . Hyperlipidemia   . Hypertension   . Insomnia   . Memory difficulty 08/04/2014  . Nicotine dependence   . Numbness and tingling of right arm 08/04/2014  . Osteoarthritis   . Overactive bladder   . Radiculopathy   . Shortness of breath    with activity  . Sleep apnea    does not use CPAP.  Tested maybe 5- 10 years ago  . St[facility]    Left side weakness.  . Unilateral inguinal hernia     Past Surgical History:  Procedure Laterality Date  . ANTERIOR  CERVICAL DECOMP/DISCECTOMY FUSION N/A 07/01/2012   Procedure: ANTERIOR CERVICAL DECOMPRESSION/DISCECTOMY FUSION CERVICAL FIVE-SIX /Dani GobbleREMOVAL CERVICAL SIX-SEVEN;  Surgeon: HCharlie Pitter MD;  [facility];  Service: Neurosurgery;  Laterality: N/A;  . CERVICAL FUSION    . COLONOSCOPY   08/04/2002     RMR: Normal rectum/Pancolonic diverticula/Colonic polyps as described above, biopsied and/or snared  . COLONOSCOPY  60/08/2009   RMR: normal rectum/left and right sided diverticula/multiple colonic polyps. tubular adenomas. surveillance due 2014  . COLONOSCOPY WITH ESOPHAGOGASTRODUODENOSCOPY (EGD) N/A 12/18/2012   Dr. RGala Romney  Colonic diverticulosis. Multiple colonic polyps-hyperplastic polyps and tubular adenoma. Friable anal canal hemorrhoids. EGD with chronic atrophic gastritis  . ESOPHAGOGASTRODUODENOSCOPY  04/14/2002   [id number] ring, status post dilation as described above/Hiatal hernia, focal antral erosions of uncertain clinical significance  . ESOPHAGOGASTRODUODENOSCOPY  06/09/2009   RMR: normal esophagus s/p dilator/small hiatal hernia otherwise normal  . HERNIA REPAIR Right    inguinal  . I&D EXTREMITY Right 04/23/2017   Procedure: IRRIGATION AND DEBRIDEMENT HAND;  Surgeon: MRenette Butters MD;  [facility]  Service: Orthopedics;  Laterality: Right;  . INGUINAL HERNIA REPAIR Left 04/15/2014   Procedure: LEFT INGUINAL HERNIORRHAPHY WITH MESH;  Surgeon: MAviva SignsMd, MD;  Location: [facility];  Service: General;  Laterality: Left;  . INSERTION OF MESH Left 04/15/2014   Procedure: INSERTION OF MESH;  Surgeon: [name], MD;  Location: [facility];  Service: General;  Laterality: Left;    Family History  Problem Relation Age of Onset  . Cancer Mother   . Dementia Father   . Colon cancer Neg Hx     SOCIAL HISTORY: Social History   Tobacco Use  . Smoking status: Current Some Day Smoker    Packs/day: 0.50    Years: 60.00    Pack years: 30.00    Types:  Cigarettes  . Smokeless tobacco: Never Used  Substance Use Topics  . Alcohol use: No    Allergies  Allergen Reactions  . Aspirin Nausea And Vomiting    Tolerates 40m daily    Current Outpatient Medications  Medication Sig Dispense Refill  . albuterol (PROVENTIL HFA;VENTOLIN HFA) 108 (90 Base) MCG/ACT inhaler Inhale 2 puffs into the lungs every 4 (four) hours as needed for wheezing or shortness of breath. 1 Inhaler 0  . albuterol (PROVENTIL) (2.5 MG/3ML) 0.083% nebulizer solution Take 3 mLs (2.5 mg total) by nebulization every 6 (six) hours as needed for wheezing or shortness of breath. 150 mL 1  . AMITIZA 24 MCG capsule TAKE 1 CAPSULE BY MOUTH TWICE DAILY WITH MEALS. 60 capsule 0  . aspirin EC 81 MG tablet Take 162 mg by mouth at bedtime.     . citalopram (CELEXA) 20 MG tablet TAKE 1 TABLET BY MOUTH ONCE A DAY FOR DEPRESSION. 90 tablet 3  . donepezil (ARICEPT) 10 MG tablet TAKE (1) TABLET BY MOUTH AT BEDTIME. 90 tablet 0  . fluticasone (FLONASE) 50 MCG/ACT nasal spray USE 1 OR 2 SPRAYS IN EACH NOSTRIL EVERY DAY FOR NASAL ALLERGIES. 48 g 3  . HYDROcodone-acetaminophen (NORCO/VICODIN) 5-325 MG tablet TAKE 1 TABLET BY MOUTH THREE TIMES DAILY AS NEEDED FOR MODERATE PAIN 90 tablet 0  . levETIRAcetam (KEPPRA) 250 MG tablet TAKE (1) TABLET BY MOUTH EACH MORNING. 30 tablet 0  . lisinopril (PRINIVIL,ZESTRIL) 10 MG tablet TAKE 1 TABLET BY MOUTH ONCE A DAY FOR HIGH BLOOD PRESSURE 30 tablet 0  . LORazepam (ATIVAN) 1 MG tablet TAKE (1) TABLET BY MOUTH AT BEDTIME. 30 tablet 1  . mirtazapine (REMERON) 15 MG tablet TAKE 1/2 TABLET(7.5MG) BY MOUTH AT BEDTIME FOR APPETITE. 15 tablet 0  . naproxen sodium (ALEVE) 220 MG tablet Take 440 mg by mouth daily as needed.    .Marland Kitchenomeprazole (PRILOSEC) 20 MG capsule TAKE 1 CAPSULE BY MOUTH ONCE DAILY FOR RELFUX ESOPHAGITIS OR STOMACH ULCERS. 30 capsule 0  . Respiratory Therapy Supplies (NEBULIZER/TUBING/MOUTHPIECE) KIT Disp one nebulizer machine, tubing set and  mouthpiece kit 1 each 0  . traZODone (DESYREL) 100 MG tablet TAKE (1) TABLET BY MOUTH AT BEDTIME. 30 tablet 0  . VESICARE 5 MG tablet TAKE ONE TABLET ONCE DAILY FOR BLADDER. 30 tablet 0  . Cholecalciferol (VITAMIN D-3) 1000 units CAPS TAKE 1 CAPSULE BY MOUTH ONCE DAILY. 30 capsule 11  . nitroGLYCERIN (NITROSTAT) 0.4 MG SL tablet Place 0.4 mg under the tongue every 5 (five) minutes as needed for chest pain.     No current facility-administered medications for this visit.     REVIEW OF SYSTEMS:  [X]  denotes positive finding, [ ]  denotes negative finding Cardiac  Comments:  Chest pain or chest pressure:    Shortness of breath upon exertion: x   Short of breath when lying flat: x   Irregular heart rhythm:        Vascular    Pain in calf, thigh, or hip brought  on by ambulation:    Pain in feet at night that wakes you up from your sleep:     Blood clot in your veins:    Leg swelling:         Pulmonary    Oxygen at home:    Productive cough:     Wheezing:         Neurologic    Sudden weakness in arms or legs:     Sudden numbness in arms or legs:     Sudden onset of difficulty speaking or slurred speech:    Temporary loss of vision in one eye:     Problems with dizziness:         Gastrointestinal    Blood in stool:     Vomited blood:         Genitourinary    Burning when urinating:     Blood in urine:        Psychiatric    Major depression:         Hematologic    Bleeding problems:    Problems with blood clotting too easily:        Skin    Rashes or ulcers:        Constitutional    Fever or chills:      PHYSICAL EXAM: Vitals:   11/13/17 1142 11/13/17 1152  BP: (!) 182/89 (!) 177/92  Pulse: 68 68  Resp: 16   Temp: 97.7 F (36.5 C)   TempSrc: Oral   SpO2: 100%   Weight: 77.1 kg   Height: 5' 9"  (1.753 m)     GENERAL: The patient is a well-nourished male, in no acute distress. The vital signs are documented above. CARDIAC: There is a regular rate and  rhythm.  VASCULAR:  No appreciable carotid bruit PULMONARY: There is good air exchange bilaterally without wheezing or rales. MUSCULOSKELETAL: There are no major deformities or cyanosis. NEUROLOGIC: Sitting in wheelchair. No appreciable deficits with CN exam II-XII.  Did not observe gait. SKIN: There are no ulcers or rashes noted. PSYCHIATRIC: The patient has a normal affect.  DATA:   Independently reviewed his carotid duplex and he has no significant ICA stenosis bilaterally.    Assessment/Plan:  83 year old male with multiple medical comorbidities including a remote cerebellar CVA in 2002 that presents for interval follow-up of his carotid disease.  On duplex today he has no significant elevated velocities.  His only symptoms are some right fourth finger numbness after a finger operation earlier this year and I do not think this is suggestive of a TIA or stroke.  I recommended follow-up again in 1 year with a repeat carotid duplex for ongoing surveillance.  Discussed indications for intervention with his wife and family.  He needs to continue aspirin and I discussed the importance of smoking cessation.  Look forward to seeing in 1 year.   Marty Heck, MD [facility] Office: [phone] Pager: [place]

## 2017-11-15 ENCOUNTER — Other Ambulatory Visit: Payer: Self-pay | Admitting: Family Medicine

## 2017-11-15 NOTE — Telephone Encounter (Signed)
Ok to refill Ativan??  Last office visit 10/04/2017.  Last refill 11/21/2016, #1 refill.

## 2017-11-17 DIAGNOSIS — J449 Chronic obstructive pulmonary disease, unspecified: Secondary | ICD-10-CM | POA: Diagnosis not present

## 2017-11-17 DIAGNOSIS — R062 Wheezing: Secondary | ICD-10-CM | POA: Diagnosis not present

## 2017-11-17 DIAGNOSIS — J189 Pneumonia, unspecified organism: Secondary | ICD-10-CM | POA: Diagnosis not present

## 2017-11-23 DIAGNOSIS — J449 Chronic obstructive pulmonary disease, unspecified: Secondary | ICD-10-CM | POA: Diagnosis not present

## 2017-11-23 DIAGNOSIS — J84112 Idiopathic pulmonary fibrosis: Secondary | ICD-10-CM | POA: Diagnosis not present

## 2017-12-04 ENCOUNTER — Telehealth: Payer: Self-pay | Admitting: Internal Medicine

## 2017-12-04 ENCOUNTER — Ambulatory Visit (INDEPENDENT_AMBULATORY_CARE_PROVIDER_SITE_OTHER): Payer: Medicare Other | Admitting: Internal Medicine

## 2017-12-04 ENCOUNTER — Encounter: Payer: Self-pay | Admitting: Internal Medicine

## 2017-12-04 VITALS — BP 126/82 | HR 68

## 2017-12-04 DIAGNOSIS — R0683 Snoring: Secondary | ICD-10-CM | POA: Diagnosis not present

## 2017-12-04 DIAGNOSIS — J84112 Idiopathic pulmonary fibrosis: Secondary | ICD-10-CM | POA: Diagnosis not present

## 2017-12-04 DIAGNOSIS — Z87891 Personal history of nicotine dependence: Secondary | ICD-10-CM

## 2017-12-04 DIAGNOSIS — R0681 Apnea, not elsewhere classified: Secondary | ICD-10-CM | POA: Diagnosis not present

## 2017-12-04 NOTE — Progress Notes (Signed)
HPI Chief Complaint  Patient presents with  . Consult    Referred by Verline Lema for pna.  Pt had a cxr performed 8/15 and also had a PFT performed 1/24. Pt states he has some occ CP and does a lot of cough that is worse late in the afternoon that he gets up little white phlegm.      [name] , 83 y.o. , with dob 1934/03/25 and male ,Not Hispanic or Latino from [address] [place] [address] - presents to lung clinic for evaluation of a history of recurrent pneumonias. History is mostly given by the wife but also review of the review of chart. Patient is 81 has had a stroke in 2002and is essentially ECOG 4 with good and her mental status to do basic history but is dependent for activities of daily living. He can self transfer and can have control of his bladder and bowel but needs help with his clothing. He is able to swallow without any difficulty According to the wife and him he's had recurrent pneumonia since December 2019. Typical symptoms arecough with increased sputum production and shortness of breath and chest tightness. In a few of these he has had fever. Each time treated with antibiotic and prednisone with resolution. Last episode was end of August 2019.One of the episodes he was admitted. He had a CT scan of the chest in March 2019 that I personally visualized. The radiologist as reported pneumonia but in my personal visualization I feel that he has bilateral left greater than right bibasilar honeycombing and also upper lobe groundglass opacities more concerning for interstitial lung disease. [A comparison 2014 abdominal CT lung cut did not have these findings] he did have a chest x-ray in August 2019 is reported as pneumonia but to me looks like he has left lower lobe atelectasischronic lung scarring. Given his extreme sedentary lifestyle is unclear how much baseline shortness of breath and cough he has but he does have those.  ILD exposure history: He is ongoing smoking  history has had difficulty quitting. 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 Denies any mold exposure in the house. Denies any connective tissue disease.Does have acid reflux.  His pulmonary function test in January 2019 shows a mixture of restriction and normal total lung capacity associated with significant reduction in diffusion capacity suggestive of a mixed emphysema fibrosis pattern    OV 10/11/2017  Subjective:  Patient ID: [name], male , DOB: 04/06/1934 , age 55 y.o. , MRN: [id number] , ADDRESS: [address] [place] [address]   10/11/2017 -   Chief Complaint  Patient presents with  . Follow-up    feeling better since last visit - had PFT today     HPI [name] 83 y.o. -presents with his wife and stepson to discuss his ILD work-up.  He had pulmonary function test that shows a 10% - 11% decline in [facility] since January 2019.  He had autoimmune and vasculitis and serology panel for connective tissue disease and this is negative.  Both documented below.  He had high-resolution CT chest October 05, 2017 that I personally visualized and agree with the thoracic radiology opinion of definitive UIP.  He continues to smoke    [employer] Comprehensive ILD Questionnaire Symptoms: He has gradual onset of shortness of breath for the last 18 months.  It is not any worse but then we do not know any better because  he is wheelchair-bound mostly.  He has level 2 dyspnea for eating or brushing the teeth and level 4 for standing up from the chair and level 5 for dressing feeling that he is unable to give any information about shortness of breath.  He has inability to stand or walk following a stroke.  He has a cough since May 2018 he coughs at night he brings up some sputum is the same since it started it is worse when he lies down he feels air hungry when he coughs and it does upset him.  He also has occasional wheezing.  He also has pelvic soreness on a level 7.   This happens when he tries to sit up or change position  Past medical history: He has chronic arthritis for the last several years.  He thinks he has COPD but this has not been seen in the CT scan in terms of emphysema.  He denies any connective tissue disease.  History is positive for acid reflux disease and also obstructive sleep apnea for the last several years.  He had a stroke several years ago and is on wheelchair.  Also has history of seizures following the stroke.  But no thyroid disease or diabetes or tuberculosis.  There is a history of recurrent pneumonias but this may be inaccurate and might actually reflect IPF or ILD exacerbations  Review of systems: Positive for fatigue arthralgia mild dysphagia but no Raynaud.  His weight is unknown because he cannot stand.  His review of systems positive for acid reflux and snoring and periodic yeast infection in his lower abdomen.  Most important thing in review of system is that he has weight loss and then also has nausea periodically at night sometimes resulting in admissions.  He does not have diarrhea but if he has diarrhea he will not be able to mobilize himself because of his wheelchair existence  Family history of lung disease: No fibrosis but positive for COPD  Exposure history: He continues to smoke since 1949 6 to 8 cigarettes/day.  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  Also grew tobacco prior to that for many years and sometimes it was damp.  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  He is worked in [place] is exposed to dusty environment  Pulmonary toxicity history: Extensive list of medications causing ILD reviewed positive only for prednisone for a  brief period of time in August 2019       Results for TARIS, GALINDO (MRN [id number]) as of 10/11/2017 15:12  Ref. Range 01/25/2017 08:39 10/11/2017 13:59  FVC-Pre Latest Units: L 2.69 2.56  FVC-%Pred-Pre Latest Units: % 77 73  Results for KYRIE, FLUDD (MRN [id number]) as of 10/11/2017 15:12  Ref. Range 01/25/2017 08:39 10/11/2017 13:59  DLCO unc Latest Units: ml/min/mmHg 14.41 13.30  DLCO unc % pred Latest Units: % 46 42  Results for BIJAN, RIDGLEY (MRN [id number]) as of 10/11/2017 15:12  Ref. Range 09/18/2017 09:58  ASPERGILLUS FUMIGATUS Latest Ref Range: NEGATIVE  NEGATIVE  Pigeon Serum Latest Ref Range: NEGATIVE  NEGATIVE  Anti Nuclear Antibody(ANA) Latest Ref Range: NEGATIVE  NEGATIVE  Angiotensin-Converting Enzyme Latest Ref Range: 9 - 67 U/L 9  Cyclic  Citrullin Peptide Ab Latest Units: UNITS <16  ds DNA Ab Latest Units: IU/mL <1  Myeloperoxidase Abs Latest Units: AI <1.0  Serine Protease 3 Latest Units: AI <1.0  RA Latex Turbid. Latest Ref Range: <14 IU/mL <14  SSA (Ro) (ENA) Antibody, IgG Latest Ref Range: <1.0 NEG AI <1.0 NEG  SSB (La) (ENA) Antibody, IgG Latest Ref Range: <1.0 NEG AI <1.0 NEG  Scleroderma (Scl-70) (ENA) Antibody, IgG Latest Ref Range: <1.0 NEG AI <1.0 NEG    IMPRESSION: CT chest high resolution 1. Spectrum of findings compatible with basilar predominant fibrotic interstitial lung disease with mild-to-moderate honeycombing, possibly slightly progressed since 03/04/2017 chest CT angiogram study. Findings are considered usual interstitial pneumonia (UIP) pattern per ATS consensus criteria. 2. Seven month stability of 8 mm solid superior segment left lower lobe pulmonary nodule, more likely benign. Suggest attention on follow-up chest CT in 12 months. 3. Ectatic 4.2 cm ascending thoracic aorta, stable. Recommend annual imaging followup by CTA or MRA. This recommendation follows 2010 ACCF/AHA/AATS/ACR/ASA/SCA/SCAI/SIR/STS/SVM Guidelines for the Diagnosis and  Management of Patients with Thoracic Aortic Disease. Circulation. 2010; 121: M384-Y659. 4. Three-vessel coronary atherosclerosis. 5. Mild mediastinal adenopathy is stable, most suggestive of benign reactive adenopathy.  Aortic Atherosclerosis (ICD10-I70.0).   Electronically Signed   By: Ilona Sorrel M.D.   On: 10/05/2017 09:43  ROS - per HPI   OV 12/04/2017  Subjective:  Patient ID: [name], male , DOB: 11-May-1934 , age 42 y.o. , MRN: [id number] , ADDRESS: [address] [place] [address]   12/04/2017 -   Chief Complaint  Patient presents with  . Follow-up    Patient is having trouble sleeping at night and is snoring worse then ever before, Breathing is worse at night and better during the day   Idiopathic pulmonary fibrosis: Clinical diagnosis given October 11, 2017.  Wheelchair-bound because of a stroke but can do transfers and be able to eat.  Needs assistance with shower and clothing  HPI [name] 83 y.o. -presents with his wife IPF follow-up.  At last visit we decided that the choices for his IPF treatment would either be pirfenidone or research protocol fibrinogens  with an IV infusion every 3 weeks.  He was supposed to bring his stepson Mr. [name] who is a Marine scientist at the [employer] for this discussion.  However he and his wife forgot that.  Instead he is here with his wife.  They both are very categorical that they will not do nintedanib because of diarrhea.  This because he is wheelchair-bound for most of the time and it will be difficult for him to get to the toilet.  They are open to trying pirfenidone but apparently they discussed at home with the stepson and they are instead interested in the IV infusion research protocol.  I went over the concept of research once again explaining this was voluntary, their expectations procedurally for visits every 3 weeks [[address] [place] onset 45-minute commute each way], concept of therapeutic misconception, the fact that they will  get a placebo 1 out of 3 times and also it is in an approved medication.  At this point in time the paused.  They were not sure which direction they wanted to go.  However wife did conclude saying that number of times this is been discussed at home and patient's bias was to allow the disease to run its natural course rather than take anti-fibrotic's that can have GI side effects.  However it appeared that they  were not fully confident about the decision 1 way or the other.  They did defer further conversation to the son.  In terms of his IPF he feels he stable in terms of shortness of breath and cough  Smoking: He continues to smoke unable to quit  New issue: Wife is reporting apneic spells at night along with heavy snoring.  Further questioning they tell me that he was advised CPAP 20 years ago for diagnosis of sleep apnea.  Severity is not known.  But he did have intolerance to CPAP.      ROS - per HPI     has a past medical history of Abnormality of gait (08/01/2013), Arthritis, Blindness of left eye, Carotid artery disease ([place]), Colon polyp, Constipation, Diaphragmatic hernia, Disc disease, degenerative, cervical, Diverticulitis, ED (erectile dysfunction), Gastroparesis, GERD (gastroesophageal reflux disease), Glaucoma, Heart disease, Hyperlipidemia, Hypertension, Insomnia, Memory difficulty (08/04/2014), Nicotine dependence, Numbness and tingling of right arm (08/04/2014), Osteoarthritis, Overactive bladder, Radiculopathy, Shortness of breath, Sleep apnea, Stroke ([place]), and Unilateral inguinal hernia.   reports that he has been smoking cigarettes. He has a 30.00 pack-year smoking history. He has never used smokeless tobacco.  Past Surgical History:  Procedure Laterality Date  . ANTERIOR CERVICAL DECOMP/DISCECTOMY FUSION N/A 07/01/2012   Procedure: ANTERIOR CERVICAL DECOMPRESSION/DISCECTOMY FUSION CERVICAL FIVE-SIX [name] REMOVAL CERVICAL SIX-SEVEN;  Surgeon: [name], MD;  Location: [facility];  Service: Neurosurgery;  Laterality: N/A;  . CERVICAL FUSION    . COLONOSCOPY   08/04/2002     RMR: Normal rectum/Pancolonic diverticula/Colonic polyps as described above, biopsied and/or snared  . COLONOSCOPY  60/08/2009   RMR: normal rectum/left and right sided diverticula/multiple colonic polyps. tubular adenomas. surveillance due 2014  . COLONOSCOPY WITH ESOPHAGOGASTRODUODENOSCOPY (EGD) N/A 12/18/2012   Dr. [name]:  Colonic diverticulosis. Multiple colonic polyps-hyperplastic polyps and tubular adenoma. Friable anal canal hemorrhoids. EGD with chronic atrophic gastritis  . ESOPHAGOGASTRODUODENOSCOPY  04/14/2002   VOJ:[id number]' ring, status post dilation as described above/Hiatal hernia, focal antral erosions of uncertain clinical significance  . ESOPHAGOGASTRODUODENOSCOPY  06/09/2009   RMR: normal esophagus s/p dilator/small hiatal hernia otherwise normal  . HERNIA REPAIR Right    inguinal  . I&D EXTREMITY Right 04/23/2017   Procedure: IRRIGATION AND DEBRIDEMENT HAND;  Surgeon: [name], MD;  Location: [facility];  Service: Orthopedics;  Laterality: Right;  . INGUINAL HERNIA REPAIR Left 04/15/2014   Procedure: LEFT INGUINAL HERNIORRHAPHY WITH MESH;  Surgeon: [name], MD;  Location: [facility];  Service: General;  Laterality: Left;  . INSERTION OF MESH Left 04/15/2014   Procedure: INSERTION OF MESH;  Surgeon: [name], MD;  Location: [facility];  Service: General;  Laterality: Left;    Allergies  Allergen Reactions  . Aspirin Nausea And Vomiting    Tolerates 31m daily    Immunization History  Administered Date(s) Administered  . Influenza, High Dose Seasonal PF 09/18/2017  . Influenza,inj,Quad PF,6+ Mos 10/02/2016  . Pneumococcal Conjugate-13 02/02/2017  . Pneumococcal Polysaccharide-23 11/14/1995  . Tdap 04/22/2017, 04/25/2017    Family History  Problem Relation Age of Onset  . Cancer Mother   . Dementia Father   . Colon cancer Neg Hx      Current  Outpatient Medications:  .  albuterol (PROVENTIL) (2.5 MG/3ML) 0.083% nebulizer solution, Take 3 mLs (2.5 mg total) by nebulization every 6 (six) hours as needed for wheezing or shortness of breath., Disp: 150 mL, Rfl: 1 .  AMITIZA 24 MCG capsule, TAKE 1 CAPSULE BY  MOUTH TWICE DAILY WITH MEALS., Disp: 60 capsule, Rfl: 0 .  aspirin EC 81 MG tablet, Take 162 mg by mouth at bedtime. , Disp: , Rfl:  .  Cholecalciferol (VITAMIN D-3) 1000 units CAPS, TAKE 1 CAPSULE BY MOUTH ONCE DAILY., Disp: 30 capsule, Rfl: 11 .  citalopram (CELEXA) 20 MG tablet, TAKE 1 TABLET BY MOUTH ONCE A DAY FOR DEPRESSION., Disp: 90 tablet, Rfl: 3 .  donepezil (ARICEPT) 10 MG tablet, TAKE (1) TABLET BY MOUTH AT BEDTIME., Disp: 90 tablet, Rfl: 0 .  fluticasone (FLONASE) 50 MCG/ACT nasal spray, USE 1 OR 2 SPRAYS IN EACH NOSTRIL EVERY DAY FOR NASAL ALLERGIES., Disp: 48 g, Rfl: 3 .  levETIRAcetam (KEPPRA) 250 MG tablet, TAKE (1) TABLET BY MOUTH EACH MORNING., Disp: 30 tablet, Rfl: 0 .  lisinopril (PRINIVIL,ZESTRIL) 10 MG tablet, TAKE 1 TABLET BY MOUTH ONCE A DAY FOR HIGH BLOOD PRESSURE, Disp: 30 tablet, Rfl: 0 .  lisinopril (PRINIVIL,ZESTRIL) 10 MG tablet, TAKE 1 TABLET BY MOUTH ONCE A DAY FOR HIGH BLOOD PRESSURE, Disp: 30 tablet, Rfl: 0 .  LORazepam (ATIVAN) 1 MG tablet, TAKE (1) TABLET BY MOUTH AT BEDTIME., Disp: 30 tablet, Rfl: 1 .  LORazepam (ATIVAN) 1 MG tablet, TAKE (1) TABLET BY MOUTH AT BEDTIME., Disp: 30 tablet, Rfl: 0 .  mirtazapine (REMERON) 15 MG tablet, TAKE 1/2 TABLET(7.5MG) BY MOUTH AT BEDTIME FOR APPETITE., Disp: 15 tablet, Rfl: 0 .  naproxen sodium (ALEVE) 220 MG tablet, Take 440 mg by mouth daily as needed., Disp: , Rfl:  .  nitroGLYCERIN (NITROSTAT) 0.4 MG SL tablet, Place 0.4 mg under the tongue every 5 (five) minutes as needed for chest pain., Disp: , Rfl:  .  omeprazole (PRILOSEC) 20 MG capsule, TAKE 1 CAPSULE BY MOUTH ONCE DAILY FOR RELFUX ESOPHAGITIS OR STOMACH ULCERS., Disp: 30 capsule, Rfl: 0 .  Respiratory  Therapy Supplies (NEBULIZER/TUBING/MOUTHPIECE) KIT, Disp one nebulizer machine, tubing set and mouthpiece kit, Disp: 1 each, Rfl: 0 .  traZODone (DESYREL) 100 MG tablet, TAKE (1) TABLET BY MOUTH AT BEDTIME., Disp: 30 tablet, Rfl: 0 .  traZODone (DESYREL) 100 MG tablet, TAKE (1) TABLET BY MOUTH AT BEDTIME., Disp: 30 tablet, Rfl: 0 .  VESICARE 5 MG tablet, TAKE ONE TABLET ONCE DAILY FOR BLADDER., Disp: 30 tablet, Rfl: 0 .  albuterol (PROVENTIL HFA;VENTOLIN HFA) 108 (90 Base) MCG/ACT inhaler, Inhale 2 puffs into the lungs every 4 (four) hours as needed for wheezing or shortness of breath. (Patient not taking: Reported on 12/04/2017), Disp: 1 Inhaler, Rfl: 0      Objective:   Vitals:   12/04/17 1342  BP: 126/82  Pulse: 68  SpO2: 98%    Estimated body mass index is 25.1 kg/m as calculated from the following:   Height as of 11/13/17: _0  (1.753 m).   Weight as of 11/13/17: 170 lb (77.1 kg).  _1 @  [employer]     Physical Exam  General Appearance:    Alert, cooperative, no distress, appears stated age -slightly older than stated age, Deconditioned looking -yes, OBESE  -no, Sitting on Wheelchair -yes  Head:    Normocephalic, without obvious abnormality, atraumatic  Eyes:    PERRL, conjunctiva/corneas clear,  Ears:    Normal TM's and external ear canals, both ears  Nose:   Nares normal, septum midline, mucosa normal, no drainage    or sinus tenderness. OXYGEN ON  -no. Patient is _2  air  Throat:   Lips, mucosa, and tongue normal; teeth and gums normal. Cyanosis on  lips -no  Neck:   Supple, symmetrical, trachea midline, no adenopathy;    thyroid:  no enlargement/tenderness/nodules; no carotid   bruit or JVD  Back:     Symmetric, no curvature, ROM normal, no CVA tenderness  Lungs:     Distress -no, Wheeze no, Barrell Chest -no, Purse lip breathing -no, Crackles -is bilaterally bibasilarly  Chest Wall:    No tenderness or deformity.    Heart:    Regular rate and rhythm,  S1 and S2 normal, no rub   or gallop, Murmur -no  Breast Exam:    NOT DONE  Abdomen:     Soft, non-tender, bowel sounds active all four quadrants,    no masses, no organomegaly. Visceral obesity -no  Genitalia:   NOT DONE  Rectal:   NOT DONE  Extremities:   Extremities - normal, Has Cane -no, Clubbing -yes, Edema -no  Pulses:   2+ and symmetric all extremities  Skin:   Stigmata of Connective Tissue Disease -no  Lymph nodes:   Cervical, supraclavicular, and axillary nodes normal  Psychiatric:  Neurologic:   Pleasant -yes, Anxious - no, Flat affect - yes  CAm-ICU - neg, Alert and Oriented x 3 - yes, Moves all 4s - yes but weak on the right, Speech - normal, Cognition - intact           Assessment:       ICD-10-CM   1. IPF (idiopathic pulmonary fibrosis) ([facility]) J84.112   2. History of smoking Z87.891   3. Snoring R06.83   4. Witnessed apneic spells R06.81        Plan:     Patient Instructions  IPF (idiopathic pulmonary fibrosis) ([facility]) - clinically stable since last visit - continue night o2  - seems you are leaning more towards research protocol with IV infusion - Zeph[facility] Fibrogen Study protocol  - given you copy of consent form - read it over esp with your son [name] who is a Therapist, sports - will also talk to Bernie and ensure you are not wanting to do esbriet (do understand that ofev because of diarrhea is no go esp with being on wheel chair)  -Please give the phone number of Benjamin Julias to my medical assistant/LPN so I can give him a call  History of smoking  please try to quit  Snoring Witnessed apneic spells  - refer sleep doc in our office  followup 2 months return to ILD clinic     SIGNATURE    Dr. Brand Males, M.D., F.C.C.P,  Pulmonary and Critical Care Medicine Staff Physician, [place] Director - Interstitial Lung Disease  Program  Pulmonary [place] at [place], [place], [address]  Pager: [phone], If no answer or between  15:00h - 7:00h: call [phone] Telephone: [phone]  2:18 PM 12/04/2017

## 2017-12-04 NOTE — Telephone Encounter (Signed)
Heather,  Please get me number for Burtis Junes who is a Therapist, sports. Need to talk to him about their decision  Thanks    SIGNATURE    Dr. Brand Males, M.D., F.C.C.P,  Pulmonary and Critical Care Medicine Staff Physician, [place] Director - Interstitial Lung Disease  Program  Pulmonary [place] at [place], [place], [address]  Pager: [phone], If no answer or between  15:00h - 7:00h: call [phone] Telephone: [phone]  2:20 PM 12/04/2017

## 2017-12-04 NOTE — Telephone Encounter (Signed)
Duard Brady RN- Mr. Joshuwa Vecchio   [phone]  Please contact him about medication for Mr.Childers.  Routing to MR.

## 2017-12-04 NOTE — Patient Instructions (Addendum)
IPF (idiopathic pulmonary fibrosis) ([facility]) - clinically stable since last visit - continue night o2  - seems you are leaning more towards research protocol with IV infusion - Zephyrus Fibrogen Study protocol  - given you copy of consent form - read it over esp with your son [name] who is a Therapist, sports [phone] - will also talk to [name] and ensure you are not wanting to do esbriet (do understand that ofev because of diarrhea is no go esp with being on wheel chair)  -Please give the phone number of [name] to my medical assistant/LPN so I can give him a call  History of smoking  please try to quit   Snoring Witnessed apneic spells  - refer sleep doc in our office  followup 2 months return to ILD clinic

## 2017-12-05 NOTE — Telephone Encounter (Signed)
I have routed in another encounter His number. Will close this message.

## 2017-12-17 DIAGNOSIS — R062 Wheezing: Secondary | ICD-10-CM | POA: Diagnosis not present

## 2017-12-17 DIAGNOSIS — J449 Chronic obstructive pulmonary disease, unspecified: Secondary | ICD-10-CM | POA: Diagnosis not present

## 2017-12-17 DIAGNOSIS — J189 Pneumonia, unspecified organism: Secondary | ICD-10-CM | POA: Diagnosis not present

## 2017-12-18 ENCOUNTER — Other Ambulatory Visit: Payer: Self-pay | Admitting: Family Medicine

## 2017-12-18 NOTE — Telephone Encounter (Signed)
Ok to refill??  Last office visit 10/04/2017.  Last refill 11/21/2016????

## 2017-12-18 NOTE — Telephone Encounter (Signed)
I need a list of the medications they have been filling for the past few months. Family states duplicate prescriptions For now these refills will be denied until I can reconcile his medications.

## 2017-12-19 ENCOUNTER — Telehealth: Payer: Self-pay | Admitting: Family Medicine

## 2017-12-19 MED ORDER — LORAZEPAM 1 MG PO TABS: ORAL_TABLET | ORAL | 3 refills | Status: DC

## 2017-12-19 NOTE — Telephone Encounter (Signed)
Call placed to [facility] and [facility], pharmacist made aware. Will send list via fax.

## 2017-12-19 NOTE — Telephone Encounter (Signed)
Call pt wife, I have reviewed his medications He has not been on the aricept this was prescribed by neurology- seems he has been off for a few months, so we can just leave it off for now  He has been on the appetite medication remeron since Jan, there has been little to no change in his weight, so I am going to stop this. We cant increase due to his other medications.  He is suppose to continue the trazodone for sleep, ativan for anxiety/sleep, celexa in morning for depression  He is not getting any duplicate medications  Amitiza should be twice a day for his constipation   Okay to refill his medications to mail order  D[facility] off [facility]  I have sent in [facility] for 30 day supply 6 refills

## 2017-12-20 ENCOUNTER — Other Ambulatory Visit: Payer: Self-pay | Admitting: Family Medicine

## 2017-12-20 MED ORDER — LEVETIRACETAM 250 MG PO TABS: ORAL_TABLET | ORAL | 6 refills | Status: DC

## 2017-12-20 MED ORDER — LUBIPROSTONE 24 MCG PO CAPS: ORAL_CAPSULE | ORAL | 6 refills | Status: DC

## 2017-12-20 MED ORDER — LISINOPRIL 10 MG PO TABS: ORAL_TABLET | ORAL | 6 refills | Status: DC

## 2017-12-20 MED ORDER — OMEPRAZOLE 20 MG PO CPDR: DELAYED_RELEASE_CAPSULE | ORAL | 6 refills | Status: DC

## 2017-12-20 MED ORDER — TRAZODONE HCL 100 MG PO TABS: ORAL_TABLET | ORAL | 6 refills | Status: DC

## 2017-12-20 MED ORDER — CITALOPRAM HYDROBROMIDE 20 MG PO TABS: ORAL_TABLET | ORAL | 6 refills | Status: DC

## 2017-12-20 MED ORDER — SOLIFENACIN SUCCINATE 5 MG PO TABS: ORAL_TABLET | ORAL | 6 refills | Status: DC

## 2017-12-20 MED ORDER — VITAMIN D-3 25 MCG (1000 UT) PO CAPS: 1.0000 | ORAL_CAPSULE | Freq: Every day | ORAL | 6 refills | Status: DC

## 2017-12-20 NOTE — Addendum Note (Signed)
Addended by: Sheral Flow on: 12/20/2017 08:12 AM   Modules accepted: Orders

## 2017-12-20 NOTE — Telephone Encounter (Signed)
Call placed to patient and patient made aware.   Prescription sent to pharmacy.   Faxed copy of current medication list to [facility].

## 2017-12-23 DIAGNOSIS — J84112 Idiopathic pulmonary fibrosis: Secondary | ICD-10-CM | POA: Diagnosis not present

## 2017-12-23 DIAGNOSIS — J449 Chronic obstructive pulmonary disease, unspecified: Secondary | ICD-10-CM | POA: Diagnosis not present

## 2017-12-24 ENCOUNTER — Other Ambulatory Visit: Payer: Self-pay | Admitting: Internal Medicine

## 2017-12-24 DIAGNOSIS — R05 Cough: Secondary | ICD-10-CM

## 2017-12-24 DIAGNOSIS — J449 Chronic obstructive pulmonary disease, unspecified: Secondary | ICD-10-CM | POA: Diagnosis not present

## 2017-12-24 DIAGNOSIS — M4800 Spinal stenosis, site unspecified: Secondary | ICD-10-CM | POA: Diagnosis not present

## 2017-12-24 DIAGNOSIS — R0602 Shortness of breath: Secondary | ICD-10-CM

## 2017-12-24 DIAGNOSIS — I10 Essential (primary) hypertension: Secondary | ICD-10-CM | POA: Diagnosis not present

## 2017-12-24 DIAGNOSIS — R059 Cough, unspecified: Secondary | ICD-10-CM

## 2018-01-01 ENCOUNTER — Institutional Professional Consult (permissible substitution): Payer: Medicare Other | Admitting: Pulmonary Disease

## 2018-01-09 DIAGNOSIS — B351 Tinea unguium: Secondary | ICD-10-CM | POA: Diagnosis not present

## 2018-01-09 DIAGNOSIS — L110 Acquired keratosis follicularis: Secondary | ICD-10-CM | POA: Diagnosis not present

## 2018-01-09 DIAGNOSIS — I739 Peripheral vascular disease, unspecified: Secondary | ICD-10-CM | POA: Diagnosis not present

## 2018-01-10 ENCOUNTER — Encounter: Payer: Self-pay | Admitting: Pulmonary Disease

## 2018-01-10 ENCOUNTER — Ambulatory Visit (INDEPENDENT_AMBULATORY_CARE_PROVIDER_SITE_OTHER): Payer: Medicare Other | Admitting: Pulmonary Disease

## 2018-01-10 VITALS — BP 118/70 | HR 74 | Ht 69.0 in

## 2018-01-10 DIAGNOSIS — R0683 Snoring: Secondary | ICD-10-CM | POA: Diagnosis not present

## 2018-01-10 NOTE — Patient Instructions (Signed)
Patient with sleep onset and sleep maintenance insomnia Previous diagnosis of obstructive sleep apnea for which he was on CPAP in the 90s  We will obtain an in lab polysomnogram Depending on findings-put treatment in place  May benefit from a mild sleep-aid  I will see you back in about 3 months Sleep Apnea Sleep apnea affects breathing during sleep. It causes breathing to stop for a short time or to become shallow. It can also increase the risk of:  Heart attack.  Stroke.  Being very overweight (obese).  Diabetes.  Heart failure.  Irregular heartbeat. The goal of treatment is to help you breathe normally again. What are the causes? There are three kinds of sleep apnea:  Obstructive sleep apnea. This is caused by a blocked or collapsed airway.  Central sleep apnea. This happens when the brain does not send the right signals to the muscles that control breathing.  Mixed sleep apnea. This is a combination of obstructive and central sleep apnea. The most common cause of this condition is a collapsed or blocked airway. This can happen if:  Your throat muscles are too relaxed.  Your tongue and tonsils are too large.  You are overweight.  Your airway is too small. What increases the risk?  Being overweight.  Smoking.  Having a small airway.  Being older.  Being male.  Drinking alcohol.  Taking medicines to calm yourself (sedatives or tranquilizers).  Having family members with the condition. What are the signs or symptoms?  Trouble staying asleep.  Being sleepy or tired during the day.  Getting angry a lot.  Loud snoring.  Headaches in the morning.  Not being able to focus your mind (concentrate).  Forgetting things.  Less interest in sex.  Mood swings.  Personality changes.  Feelings of sadness (depression).  Waking up a lot during the night to pee (urinate).  Dry mouth.  Sore throat. How is this diagnosed?  Your medical  history.  A physical exam.  A test that is done when you are sleeping (sleep study). The test is most often done in a sleep lab but may also be done at home. How is this treated?   Sleeping on your side.  Using a medicine to get rid of mucus in your nose (decongestant).  Avoiding the use of alcohol, medicines to help you relax, or certain pain medicines (narcotics).  Losing weight, if needed.  Changing your diet.  Not smoking.  Using a machine to open your airway while you sleep, such as: ? An oral appliance. This is a mouthpiece that shifts your lower jaw forward. ? A CPAP device. This device blows air through a mask when you breathe out (exhale). ? An EPAP device. This has valves that you put in each nostril. ? A BPAP device. This device blows air through a mask when you breathe in (inhale) and breathe out.  Having surgery if other treatments do not work. It is important to get treatment for sleep apnea. Without treatment, it can lead to:  High blood pressure.  Coronary artery disease.  In men, not being able to have an erection (impotence).  Reduced thinking ability. Follow these instructions at home: Lifestyle  Make changes that your doctor recommends.  Eat a healthy diet.  Lose weight if needed.  Avoid alcohol, medicines to help you relax, and some pain medicines.  Do not use any products that contain nicotine or tobacco, such as cigarettes, e-cigarettes, and chewing tobacco. If you need help quitting,  ask your doctor. General instructions  Take over-the-counter and prescription medicines only as told by your doctor.  If you were given a machine to use while you sleep, use it only as told by your doctor.  If you are having surgery, make sure to tell your doctor you have sleep apnea. You may need to bring your device with you.  Keep all follow-up visits as told by your doctor. This is important. Contact a doctor if:  The machine that you were given to  use during sleep bothers you or does not seem to be working.  You do not get better.  You get worse. Get help right away if:  Your chest hurts.  You have trouble breathing in enough air.  You have an uncomfortable feeling in your back, arms, or stomach.  You have trouble talking.  One side of your body feels weak.  A part of your face is hanging down. These symptoms may be an emergency. Do not wait to see if the symptoms will go away. Get medical help right away. Call your local emergency services (911 in the [place] Do not drive yourself to the hospital. Summary  This condition affects breathing during sleep.  The most common cause is a collapsed or blocked airway.  The goal of treatment is to help you breathe normally while you sleep. This information is not intended to replace advice given to you by your health care provider. Make sure you discuss any questions you have with your health care provider. Document Released: 09/28/2007 Document Revised: 08/14/2017 Document Reviewed: 08/14/2017 Elsevier Interactive Patient Education  [employer].

## 2018-01-10 NOTE — Progress Notes (Signed)
Subjective:    Patient ID: [name], male    DOB: 1934-07-26, 84 y.o.   MRN: [id number]  Chief complaint: Sleep maintenance insomnia, history of obstructive sleep apnea-was using CPAP in the past  History of present illness: He does have sleep onset and sleep maintenance insomnia History of obstructive sleep apnea diagnosed in the 90s, use CPAP for a few months Currently on oxygen supplementation at night  He was accompanied by spouse History of snoring, history of restlessness at night History of difficulty going to sleep History of witnessed apneas  Sometimes takes him up to 2 hours to fall asleep Wakes up a couple of times during the night Final awakening time between 7:30 AM and 8 AM Tries to go to bed between 8 and 8:30 PM  Has an underlying history of obstructive lung disease History of pulmonary fibrosis On oxygen supplementation at night Follows up with Dr. Chase Caller     Review of Systems  Constitutional: Positive for appetite change and unexpected weight change. Negative for fever.  HENT: Positive for congestion and sneezing. Negative for dental problem, ear pain, nosebleeds, postnasal drip, rhinorrhea, sinus pressure, sore throat and trouble swallowing.   Eyes: Negative for redness and itching.  Respiratory: Positive for cough and shortness of breath. Negative for chest tightness and wheezing.   Cardiovascular: Negative for palpitations and leg swelling.  Gastrointestinal: Negative for nausea and vomiting.  Genitourinary: Negative for dysuria.  Musculoskeletal: Positive for joint swelling.  Skin: Negative for rash.  Allergic/Immunologic: Negative.  Negative for environmental allergies, food allergies and immunocompromised state.  Neurological: Negative for headaches.  Hematological: Does not bruise/bleed easily.  Psychiatric/Behavioral: Negative for dysphoric mood. The patient is nervous/anxious.        Objective:   Physical Exam Constitutional:    Appearance: Normal appearance.  HENT:     Head: Normocephalic and atraumatic.     Nose: Nose normal. No congestion or rhinorrhea.     Mouth/Throat:     Mouth: Mucous membranes are moist.  Eyes:     General:        Right eye: No discharge.        Left eye: No discharge.     Extraocular Movements: Extraocular movements intact.     Pupils: Pupils are equal, round, and reactive to light.  Neck:     Musculoskeletal: Normal range of motion. No neck rigidity.  Cardiovascular:     Rate and Rhythm: Normal rate and regular rhythm.     Heart sounds: No friction rub.  Pulmonary:     Effort: Pulmonary effort is normal. No respiratory distress.     Breath sounds: No stridor. No wheezing.  Abdominal:     General: Abdomen is flat. Bowel sounds are normal. There is no distension.     Palpations: There is no mass.  Musculoskeletal: Normal range of motion.        General: No swelling or tenderness.     Comments: Wheelchair-borne  Neurological:     Mental Status: He is alert.    Results of the Epworth flowsheet 01/10/2018 12/04/2017  Sitting and reading 3 0  Watching TV 2 3  Sitting, inactive in a public place (e.g. a theatre or a meeting) 0 0  As a passenger in a car for an hour without a break 0 0  Lying down to rest in the afternoon when circumstances permit 3 3  Sitting and talking to someone 0 0  Sitting quietly after a lunch without alcohol 2  3  In a car, while stopped for a few minutes in traffic 0 0  Total score 10 9      Assessment & Plan:  .  Sleep maintenance and sleep onset insomnia .  High probability of obstructive sleep apnea -Past history of diagnosed obstructive sleep apnea -History of significant snoring, -History of witnessed apneas .  History of obstructive lung disease .  History of pulmonary fibrosis .  Hypoxemic respiratory failure/nocturnal desaturation  Plan: We will schedule the patient for an in lab polysomnogram -This will be more appropriate for the  patient -The likelihood of a falsely negative study does exist and he has pulmonary comorbidities that are significant  Pathophysiology of sleep disordered breathing discussed Treatment options of sleep disordered breathing discussed  Use of a mild hypnotic also discussed to help with insomnia  Smoking cessation counseling  We will see him back in the office in about 3 months Encouraged to call with any significant concerns

## 2018-01-16 ENCOUNTER — Other Ambulatory Visit: Payer: Self-pay | Admitting: Internal Medicine

## 2018-01-16 DIAGNOSIS — J449 Chronic obstructive pulmonary disease, unspecified: Secondary | ICD-10-CM

## 2018-01-16 DIAGNOSIS — R059 Cough, unspecified: Secondary | ICD-10-CM

## 2018-01-16 DIAGNOSIS — R05 Cough: Secondary | ICD-10-CM

## 2018-01-16 DIAGNOSIS — R0602 Shortness of breath: Secondary | ICD-10-CM

## 2018-01-17 DIAGNOSIS — J449 Chronic obstructive pulmonary disease, unspecified: Secondary | ICD-10-CM | POA: Diagnosis not present

## 2018-01-17 DIAGNOSIS — R062 Wheezing: Secondary | ICD-10-CM | POA: Diagnosis not present

## 2018-01-17 DIAGNOSIS — J189 Pneumonia, unspecified organism: Secondary | ICD-10-CM | POA: Diagnosis not present

## 2018-01-23 DIAGNOSIS — J84112 Idiopathic pulmonary fibrosis: Secondary | ICD-10-CM | POA: Diagnosis not present

## 2018-01-23 DIAGNOSIS — J449 Chronic obstructive pulmonary disease, unspecified: Secondary | ICD-10-CM | POA: Diagnosis not present

## 2018-01-24 ENCOUNTER — Ambulatory Visit: Payer: Medicare Other | Attending: Pulmonary Disease | Admitting: Pulmonary Disease

## 2018-01-24 DIAGNOSIS — J449 Chronic obstructive pulmonary disease, unspecified: Secondary | ICD-10-CM | POA: Diagnosis not present

## 2018-01-24 DIAGNOSIS — G4733 Obstructive sleep apnea (adult) (pediatric): Secondary | ICD-10-CM | POA: Diagnosis not present

## 2018-01-24 DIAGNOSIS — R0683 Snoring: Secondary | ICD-10-CM

## 2018-01-24 DIAGNOSIS — I10 Essential (primary) hypertension: Secondary | ICD-10-CM | POA: Diagnosis not present

## 2018-01-24 DIAGNOSIS — M4800 Spinal stenosis, site unspecified: Secondary | ICD-10-CM | POA: Diagnosis not present

## 2018-01-29 ENCOUNTER — Telehealth: Payer: Self-pay | Admitting: Pulmonary Disease

## 2018-01-29 DIAGNOSIS — G4733 Obstructive sleep apnea (adult) (pediatric): Secondary | ICD-10-CM

## 2018-01-29 NOTE — Telephone Encounter (Signed)
Sleep study result  Date of study: 01/24/2018  DME referral  Severe obstructive sleep apnea  Trial of BiPAP therapy on 18/14 cm H2O with a Medium size Fisher[employer]kel Full Face Mask Simplus mask and heated humidification.

## 2018-01-29 NOTE — Telephone Encounter (Signed)
Sleep study result  Date of study 01/24/2018  Severe obstructive sleep apnea  See other entry

## 2018-01-29 NOTE — Telephone Encounter (Signed)
Pt is calling back [phone]

## 2018-01-29 NOTE — Procedures (Signed)
POLYSOMNOGRAPHY  Last, First: [name], [name] MRN: [id number] Gender: Male Age (years): 67 Weight (lbs): 170 DOB: [birth year] BMI: 25 Primary Care: No PCP Epworth Score: 12 Referring: Laurin Coder MD Technician: Peak, Robert Interpreting: Laurin Coder MD Study Type: Split Night BiPAP Ordered Study Type: Split Night CPAP Study date: 01/24/2018 Location: [place] CLINICAL INFORMATION [name] is a 84 year old Male and was referred to the sleep center for evaluation of N/A. Indications include Snoring.  MEDICATIONS Patient self administered medications include: N/A. Medications administered during study include No sleep medicine administered.  SLEEP STUDY TECHNIQUE The patient underwent an attended overnight level one polysomnography titration to assess the effects of BIPAP therapy. The following variables were monitored: EEG (C4-A1, C3-A2, O1-A2, O2-A1), EOG, submental and leg EMG, ECG, oxyhemoglobin saturation by pulse oximetry, thoracic and abdominal respiratory effort belts, nasal/oral airflow by pressure sensor, body position sensor and snoring sensor. BIPAP pressure was titrated to eliminate apneas, hypopneas and oxygen desaturation.Hypopneas were scored per AASM definition IB (4% desaturation)  The NPSG portion of the study ended at 1:29:27 AM. The BiPAP titration was initiated at 1:31:31 AM AM with the BIPAP portion of the study ending at 4:31:55 AM.  TECHNICIAN COMMENTS Comments added by Technician: Patient had difficulty initiating sleep. Comments added by Scorer: N/A SLEEP ARCHITECTURE The recording time for the entire night was 442.3 minutes.  The diagnostic portion was initiated at 9:09:39 PM and terminated at 1:29:27 AM. The time in bed was 259.8 minutes. EEG confirmed total sleep time was 149.5 minutes yielding a sleep efficiency of 57.5%%. Sleep onset after lights out was 31.6 minutes with a REM latency of N/A minutes. The patient spent 53.5%% of the night in  stage N1 sleep, 46.5% % in stage N2 sleep, 0.0%% in stage N3 and 0% in REM. The Arousal Index was 37.7/hour.  The titration portion was initiated at 1:31:31 AM and terminated at 4:31:55 AM. The time in bed was 180.4 minutes. EEG confirmed total sleep time was 159.0 minutes yielding a sleep efficiency of 88.1%%. Sleep onset after BiPAP initiation was 11.7 minutes with a REM latency of 93.5 minutes. The patient spent 6.9%% of the night in stage N1 sleep, 60.1%% in stage N2 sleep, 0.0%% in stage N3 and 33% in REM. The Arousal Index was 5.3/hour. RESPIRATORY PARAMETERS During the diagnostic portion, there were a total of 205 respiratory disturbances recorded; 128 apneas ( 111 obstructive, 11 mixed, 6 central), 77 hypopneas and 0 RERAs. The apnea/hypopnea index 82.3 was events/hour and the RDI was 82.3 events/hour. The central sleep apnea index was 2.4 events/hour. The REM AHI was N/A/h and NREM AHI was 82.3/h. The REM RDI was 0.0/h and NREM RDI was 82.3/h. The supine AHI was 82.3/h, and the non supine AHI was 0/h; supine during 100.0%% of sleep. The supine RDI was 82.3/h, and the non supine RDI was N/A/h. Respiratory disturbances were associated with oxygen desaturation down to a nadir of 84.0 % during sleep. The mean oxygen saturation during the study was 92.2%. The cumulative time under 88% oxygen saturation was 22.3 minutes.  During the titration portion, the apnea/hypopnea index (AHI) was 12.5 events/hour and the RDI was 13.2 events/hour. The central sleep apnea index was events/hour. The most appropriate setting of BiPAP was IPAP/EPAP 18/14 cm H2O. At this setting, the sleep efficiency was 99% and the patient was supine for 100%. The AHI was 4.4 events per hour(with 4 central events). Oxygen nadir was 89.0 . LEG MOVEMENT DATA The periodic limb  movement index was 0.0/hour with an associated arousal index of /hour. CARDIAC DATA The underlying cardiac rhythm was most consistent with sinus rhythm. Mean heart  rate was 58.1 during diagnostic portion and 62.0 during titration portion of study. Additional rhythm abnormalities include PVCs.   IMPRESSIONS - Electrocardiographic data showed presence of PVCs. - The patient snored with loud snoring volume. - Severe Obstructive Sleep apnea(OSA) Optimal pressure attained. - No Significant Central Sleep Apnea (CSA) - Moderate Oxygen Desaturation - No significant periodic leg movements(PLMs) during sleep. No significant associated arousals. - Reduced sleep efficiency, long primary sleep latency, no REM sleep latency and no slow wave latency.   DIAGNOSIS - Obstructive Sleep Apnea (327.23 [G47.33 ICD-10])   RECOMMENDATIONS - Trial of BiPAP therapy on 18/14 cm H2O with a Medium size Fisher[employer]Paykel Full Face Mask Simplus mask and heated humidification. - Avoid alcohol, sedatives and other CNS depressants that may worsen sleep apnea and disrupt normal sleep architecture. - Sleep hygiene should be reviewed to assess factors that may improve sleep quality. - Weight management and regular exercise should be initiated or continued. - Return to Sleep Center for follow-up.  [Electronically signed] 01/29/2018 07:23 AM  Sherrilyn Rist MD NPI: [id number]

## 2018-01-29 NOTE — Telephone Encounter (Signed)
Spoke with pt and his wife. They are aware of his sleep study results. Order has been placed for BiPAP. Nothing further was needed.

## 2018-01-29 NOTE — Telephone Encounter (Signed)
Left message with the patient CNA to call back.

## 2018-02-05 ENCOUNTER — Telehealth: Payer: Self-pay | Admitting: Internal Medicine

## 2018-02-05 NOTE — Telephone Encounter (Signed)
lmtcb x1 for pt. 

## 2018-02-06 NOTE — Telephone Encounter (Signed)
I do not see any documentation that our office has contacted him recently.  LMTCB x2 for pt.

## 2018-02-07 ENCOUNTER — Ambulatory Visit: Payer: Medicare Other | Admitting: Internal Medicine

## 2018-02-07 ENCOUNTER — Other Ambulatory Visit: Payer: Self-pay | Admitting: *Deleted

## 2018-02-07 MED ORDER — MIRABEGRON ER 25 MG PO TB24: 25.0000 mg | ORAL_TABLET | Freq: Every day | ORAL | 3 refills | Status: DC

## 2018-02-07 NOTE — Telephone Encounter (Signed)
LMTCB x3 for pt.  

## 2018-02-08 NOTE — Telephone Encounter (Signed)
We have attempted to contact pt several times with no success or call back from pt. Per triage protocol, message will be closed.  

## 2018-02-11 ENCOUNTER — Other Ambulatory Visit: Payer: Self-pay | Admitting: Internal Medicine

## 2018-02-11 ENCOUNTER — Other Ambulatory Visit: Payer: Self-pay | Admitting: *Deleted

## 2018-02-11 DIAGNOSIS — R0602 Shortness of breath: Secondary | ICD-10-CM

## 2018-02-11 DIAGNOSIS — R059 Cough, unspecified: Secondary | ICD-10-CM

## 2018-02-11 DIAGNOSIS — J449 Chronic obstructive pulmonary disease, unspecified: Secondary | ICD-10-CM

## 2018-02-11 DIAGNOSIS — R05 Cough: Secondary | ICD-10-CM

## 2018-02-11 DIAGNOSIS — G4733 Obstructive sleep apnea (adult) (pediatric): Secondary | ICD-10-CM | POA: Diagnosis not present

## 2018-02-11 MED ORDER — ALBUTEROL SULFATE (2.5 MG/3ML) 0.083% IN NEBU: INHALATION_SOLUTION | RESPIRATORY_TRACT | 0 refills | Status: DC

## 2018-02-11 MED ORDER — ALBUTEROL SULFATE (2.5 MG/3ML) 0.083% IN NEBU: 2.5000 mg | INHALATION_SOLUTION | Freq: Four times a day (QID) | RESPIRATORY_TRACT | 0 refills | Status: DC | PRN

## 2018-02-13 NOTE — Telephone Encounter (Signed)
Called stepson a 3rd time in past 2 months and lmtcb  Will close message  Question is if go straight to fibrogen study or try antifibrotic first   THanks  MR

## 2018-02-14 NOTE — Telephone Encounter (Signed)
Spoke to step son 02/14/2018 5:42 PM - says patient is very sedentary and not motivated. Says patient has no motivation to do anything. Says all he does is sit in chair all day, watch TV and go to bed. Says they even paid for a service to take him places but patient would not do. In son in law opinion patient is not a good canidate for anti fibrotic or research trials

## 2018-02-17 DIAGNOSIS — J189 Pneumonia, unspecified organism: Secondary | ICD-10-CM | POA: Diagnosis not present

## 2018-02-17 DIAGNOSIS — R062 Wheezing: Secondary | ICD-10-CM | POA: Diagnosis not present

## 2018-02-17 DIAGNOSIS — J449 Chronic obstructive pulmonary disease, unspecified: Secondary | ICD-10-CM | POA: Diagnosis not present

## 2018-02-23 DIAGNOSIS — J84112 Idiopathic pulmonary fibrosis: Secondary | ICD-10-CM | POA: Diagnosis not present

## 2018-02-23 DIAGNOSIS — J449 Chronic obstructive pulmonary disease, unspecified: Secondary | ICD-10-CM | POA: Diagnosis not present

## 2018-02-24 DIAGNOSIS — J449 Chronic obstructive pulmonary disease, unspecified: Secondary | ICD-10-CM | POA: Diagnosis not present

## 2018-02-24 DIAGNOSIS — I10 Essential (primary) hypertension: Secondary | ICD-10-CM | POA: Diagnosis not present

## 2018-02-24 DIAGNOSIS — M4800 Spinal stenosis, site unspecified: Secondary | ICD-10-CM | POA: Diagnosis not present

## 2018-03-18 DIAGNOSIS — R062 Wheezing: Secondary | ICD-10-CM | POA: Diagnosis not present

## 2018-03-18 DIAGNOSIS — J189 Pneumonia, unspecified organism: Secondary | ICD-10-CM | POA: Diagnosis not present

## 2018-03-18 DIAGNOSIS — J449 Chronic obstructive pulmonary disease, unspecified: Secondary | ICD-10-CM | POA: Diagnosis not present

## 2018-03-20 ENCOUNTER — Other Ambulatory Visit: Payer: Self-pay | Admitting: Family Medicine

## 2018-03-20 NOTE — Telephone Encounter (Signed)
Requesting refill    Lorazpam  LOV: 10/04/17   LRF:  12/19/17

## 2018-03-24 DIAGNOSIS — J84112 Idiopathic pulmonary fibrosis: Secondary | ICD-10-CM | POA: Diagnosis not present

## 2018-03-24 DIAGNOSIS — J449 Chronic obstructive pulmonary disease, unspecified: Secondary | ICD-10-CM | POA: Diagnosis not present

## 2018-04-17 ENCOUNTER — Other Ambulatory Visit: Payer: Self-pay | Admitting: Family Medicine

## 2018-04-18 DIAGNOSIS — R062 Wheezing: Secondary | ICD-10-CM | POA: Diagnosis not present

## 2018-04-18 DIAGNOSIS — J189 Pneumonia, unspecified organism: Secondary | ICD-10-CM | POA: Diagnosis not present

## 2018-04-18 DIAGNOSIS — J449 Chronic obstructive pulmonary disease, unspecified: Secondary | ICD-10-CM | POA: Diagnosis not present

## 2018-04-24 DIAGNOSIS — J449 Chronic obstructive pulmonary disease, unspecified: Secondary | ICD-10-CM | POA: Diagnosis not present

## 2018-04-24 DIAGNOSIS — J84112 Idiopathic pulmonary fibrosis: Secondary | ICD-10-CM | POA: Diagnosis not present

## 2018-05-09 DIAGNOSIS — L110 Acquired keratosis follicularis: Secondary | ICD-10-CM | POA: Diagnosis not present

## 2018-05-09 DIAGNOSIS — I739 Peripheral vascular disease, unspecified: Secondary | ICD-10-CM | POA: Diagnosis not present

## 2018-05-09 DIAGNOSIS — B351 Tinea unguium: Secondary | ICD-10-CM | POA: Diagnosis not present

## 2018-05-10 ENCOUNTER — Ambulatory Visit: Payer: Medicare Other | Admitting: Pulmonary Disease

## 2018-05-13 ENCOUNTER — Other Ambulatory Visit: Payer: Self-pay | Admitting: Internal Medicine

## 2018-05-18 DIAGNOSIS — R062 Wheezing: Secondary | ICD-10-CM | POA: Diagnosis not present

## 2018-05-18 DIAGNOSIS — J449 Chronic obstructive pulmonary disease, unspecified: Secondary | ICD-10-CM | POA: Diagnosis not present

## 2018-05-18 DIAGNOSIS — J189 Pneumonia, unspecified organism: Secondary | ICD-10-CM | POA: Diagnosis not present

## 2018-05-21 ENCOUNTER — Other Ambulatory Visit: Payer: Self-pay | Admitting: Family Medicine

## 2018-05-24 DIAGNOSIS — J449 Chronic obstructive pulmonary disease, unspecified: Secondary | ICD-10-CM | POA: Diagnosis not present

## 2018-05-24 DIAGNOSIS — J84112 Idiopathic pulmonary fibrosis: Secondary | ICD-10-CM | POA: Diagnosis not present

## 2018-06-04 DIAGNOSIS — H40013 Open angle with borderline findings, low risk, bilateral: Secondary | ICD-10-CM | POA: Diagnosis not present

## 2018-06-04 DIAGNOSIS — H40031 Anatomical narrow angle, right eye: Secondary | ICD-10-CM | POA: Diagnosis not present

## 2018-06-04 DIAGNOSIS — H2702 Aphakia, left eye: Secondary | ICD-10-CM | POA: Diagnosis not present

## 2018-06-04 DIAGNOSIS — H2511 Age-related nuclear cataract, right eye: Secondary | ICD-10-CM | POA: Diagnosis not present

## 2018-06-21 ENCOUNTER — Other Ambulatory Visit: Payer: Self-pay | Admitting: Family Medicine

## 2018-06-24 DIAGNOSIS — J84112 Idiopathic pulmonary fibrosis: Secondary | ICD-10-CM | POA: Diagnosis not present

## 2018-06-24 DIAGNOSIS — J449 Chronic obstructive pulmonary disease, unspecified: Secondary | ICD-10-CM | POA: Diagnosis not present

## 2018-07-04 DIAGNOSIS — H2511 Age-related nuclear cataract, right eye: Secondary | ICD-10-CM | POA: Diagnosis not present

## 2018-07-15 ENCOUNTER — Other Ambulatory Visit: Payer: Self-pay | Admitting: Family Medicine

## 2018-07-24 DIAGNOSIS — J449 Chronic obstructive pulmonary disease, unspecified: Secondary | ICD-10-CM | POA: Diagnosis not present

## 2018-07-24 DIAGNOSIS — J84112 Idiopathic pulmonary fibrosis: Secondary | ICD-10-CM | POA: Diagnosis not present

## 2018-07-25 ENCOUNTER — Other Ambulatory Visit: Payer: Self-pay | Admitting: Family Medicine

## 2018-07-25 NOTE — Telephone Encounter (Signed)
Ok to refill??  Last office visit 10/04/2017.  Last refill 03/20/2018, #3 refills.

## 2018-08-09 ENCOUNTER — Other Ambulatory Visit: Payer: Self-pay

## 2018-08-09 ENCOUNTER — Encounter: Payer: Self-pay | Admitting: Family Medicine

## 2018-08-09 ENCOUNTER — Ambulatory Visit (INDEPENDENT_AMBULATORY_CARE_PROVIDER_SITE_OTHER): Payer: Medicare Other | Admitting: Family Medicine

## 2018-08-09 ENCOUNTER — Ambulatory Visit: Payer: Medicare Other | Admitting: Family Medicine

## 2018-08-09 VITALS — BP 116/62 | HR 72 | Temp 98.1°F | Resp 16

## 2018-08-09 DIAGNOSIS — K219 Gastro-esophageal reflux disease without esophagitis: Secondary | ICD-10-CM

## 2018-08-09 DIAGNOSIS — R569 Unspecified convulsions: Secondary | ICD-10-CM

## 2018-08-09 DIAGNOSIS — Z993 Dependence on wheelchair: Secondary | ICD-10-CM

## 2018-08-09 DIAGNOSIS — F5104 Psychophysiologic insomnia: Secondary | ICD-10-CM

## 2018-08-09 DIAGNOSIS — F0390 Unspecified dementia without behavioral disturbance: Secondary | ICD-10-CM | POA: Diagnosis not present

## 2018-08-09 DIAGNOSIS — I10 Essential (primary) hypertension: Secondary | ICD-10-CM

## 2018-08-09 DIAGNOSIS — K5900 Constipation, unspecified: Secondary | ICD-10-CM | POA: Diagnosis not present

## 2018-08-09 DIAGNOSIS — F321 Major depressive disorder, single episode, moderate: Secondary | ICD-10-CM

## 2018-08-09 MED ORDER — TRAZODONE HCL 100 MG PO TABS: ORAL_TABLET | ORAL | 6 refills | Status: DC

## 2018-08-09 MED ORDER — LEVETIRACETAM 250 MG PO TABS: ORAL_TABLET | ORAL | 6 refills | Status: DC

## 2018-08-09 MED ORDER — CITALOPRAM HYDROBROMIDE 20 MG PO TABS: ORAL_TABLET | ORAL | 6 refills | Status: DC

## 2018-08-09 MED ORDER — MIRABEGRON ER 25 MG PO TB24: 25.0000 mg | ORAL_TABLET | Freq: Every day | ORAL | 6 refills | Status: DC

## 2018-08-09 MED ORDER — LUBIPROSTONE 24 MCG PO CAPS: ORAL_CAPSULE | ORAL | 6 refills | Status: DC

## 2018-08-09 MED ORDER — LISINOPRIL 10 MG PO TABS: ORAL_TABLET | ORAL | 6 refills | Status: DC

## 2018-08-09 MED ORDER — OMEPRAZOLE 20 MG PO CPDR: DELAYED_RELEASE_CAPSULE | ORAL | 6 refills | Status: DC

## 2018-08-09 NOTE — Assessment & Plan Note (Signed)
He continues to benefit from omeprazole has seen GI in the past no changes to the medication.

## 2018-08-09 NOTE — Progress Notes (Signed)
Subjective:    Patient ID: [name], male    DOB: November 23, 1934, 84 y.o.   MRN: [id number]  Patient presents for Follow-up (is faasting) and Medication Review (would like to change Amitiza as it is no longer effective)  Patient here to follow-up chronic medical problems.  My last visit with him was last July but he did see my PA in the fall.  His wife passed away suddenly from an aneurysm.  He is now staying with his sister and his nephew.  He states that he is doing okay.    he has aide  [phone] - [name] Scales  M,W  8-1pm,  Other days 8-5pm , has been with him for 8 years  His sister's son also lives within the home and helps take care of her.  His son actually lives very close and is also been helping.   Constipation- has not been taking the amitiza initially felt like it was not working then he would take it every now and then.  He is actually supposed to be taking this twice a day for his constipation   Idiopathic fibrosis he is due for recertification on his oxygen they were not sure how to set up his pulmonary appointment.  Does have albuterol which he uses as needed.   Recheck on cataract and pressure in eyes- Brother will take him to that appointment - [facility] next week  [phone] - House Number    I reviewed his pill pack with his nurse aide she does not typically get his medications but has been doing this since his wife died.   Review Of Systems:  GEN- denies fatigue, fever, weight loss,weakness, recent illness HEENT- denies eye drainage, change in vision, nasal discharge, CVS- denies chest pain, palpitations RESP- denies SOB, cough, wheeze ABD- denies N/V, change in stools, abd pain, + constipation  GU- denies dysuria, hematuria, dribbling, incontinence MSK-+joint pain, muscle aches, injury Neuro- denies headache, dizziness, syncope, seizure activity       Objective:    BP 116/62   Pulse 72   Temp 98.1 F (36.7 C) (Oral)   Resp 16   SpO2 96%   GEN- NAD, alert and oriented x3,sitting in wheelchair  HEENT- PERRL, EOMI, non injected sclera, pink conjunctiva, MMM, oropharynx clear Neck- Supple, no thyromegaly CVS- RRR, no murmur RESP-CTAB ABD-NABS,soft,NT,ND EXT- No edema,compression hose  Pulses- Radial  2+        Assessment & Plan:      Problem List Items Addressed This Visit      Unprioritized   Chronic insomnia    Continue trazodone.  He also has low-dose benzo at bedtime      Constipation    He has used multiple bowel medications in the past.  Restart Amitiza twice a day as previously prescribed      Dementia ([place])    He has some underlying cognitive decline.  He definitely requires an aide and assistance with his activities of daily living.  Also wheelchair-bound.      Depression, major, single episode, moderate (HCC)    Continue Celexa in the morning.  Overall he is done quite well in the setting of his wife passing which was unexpected.  He does have family members that are trying to assist him.      Essential hypertension - Primary    Blood pressure is controlled.  I will recheck his renal function along with lipid panel and CBC today.  Viewed his medications with patient  and his aide.      Relevant Orders   CBC with Differential/Platelet   Lipid panel   Comprehensive metabolic panel   GERD    He continues to benefit from omeprazole has seen GI in the past no changes to the medication.      Seizure ([place])    Continue Keppra.      Wheelchair bound      Note: This dictation was prepared with Dragon dictation along with smaller Company secretary. Any transcriptional errors that result from this process are unintentional.

## 2018-08-09 NOTE — Assessment & Plan Note (Signed)
He has used multiple bowel medications in the past.  Restart Amitiza twice a day as previously prescribed

## 2018-08-09 NOTE — Assessment & Plan Note (Signed)
Continue Keppra.

## 2018-08-09 NOTE — Assessment & Plan Note (Signed)
He has some underlying cognitive decline.  He definitely requires an aide and assistance with his activities of daily living.  Also wheelchair-bound.

## 2018-08-09 NOTE — Patient Instructions (Addendum)
Appointment to be made with Dr. Chase Caller - [facility] Doctor Amitiza twice a day for constipation Use nebulzer as needed No change to other medications We will call with lab results  F/U 4 months for wellness visit

## 2018-08-09 NOTE — Assessment & Plan Note (Signed)
Blood pressure is controlled.  I will recheck his renal function along with lipid panel and CBC today.  Viewed his medications with patient and his aide.

## 2018-08-09 NOTE — Assessment & Plan Note (Signed)
Continue trazodone.  He also has low-dose benzo at bedtime

## 2018-08-09 NOTE — Assessment & Plan Note (Signed)
Continue Celexa in the morning.  Overall he is done quite well in the setting of his wife passing which was unexpected.  He does have family members that are trying to assist him.

## 2018-08-10 LAB — CBC WITH DIFFERENTIAL/PLATELET
Absolute Monocytes: 532 cells/uL (ref 200–950)
Basophils Absolute: 73 cells/uL (ref 0–200)
Basophils Relative: 1.3 %
Eosinophils Absolute: 560 cells/uL — ABNORMAL HIGH (ref 15–500)
Eosinophils Relative: 10 %
HCT: 39.9 % (ref 38.5–50.0)
Hemoglobin: 13.6 g/dL (ref 13.2–17.1)
Lymphs Abs: 2010 cells/uL (ref 850–3900)
MCH: 31.1 pg (ref 27.0–33.0)
MCHC: 34.1 g/dL (ref 32.0–36.0)
MCV: 91.1 fL (ref 80.0–100.0)
MPV: 11 fL (ref 7.5–12.5)
Monocytes Relative: 9.5 %
Neutro Abs: 2425 cells/uL (ref 1500–7800)
Neutrophils Relative %: 43.3 %
Platelets: 170 10*3/uL (ref 140–400)
RBC: 4.38 10*6/uL (ref 4.20–5.80)
RDW: 12.9 % (ref 11.0–15.0)
Total Lymphocyte: 35.9 %
WBC: 5.6 10*3/uL (ref 3.8–10.8)

## 2018-08-10 LAB — COMPREHENSIVE METABOLIC PANEL
AG Ratio: 1.5 (calc) (ref 1.0–2.5)
ALT: 8 U/L — ABNORMAL LOW (ref 9–46)
AST: 16 U/L (ref 10–35)
Albumin: 3.9 g/dL (ref 3.6–5.1)
Alkaline phosphatase (APISO): 75 U/L (ref 35–144)
BUN: 13 mg/dL (ref 7–25)
CO2: 23 mmol/L (ref 20–32)
Calcium: 9.1 mg/dL (ref 8.6–10.3)
Chloride: 110 mmol/L (ref 98–110)
Creat: 1.07 mg/dL (ref 0.70–1.11)
Globulin: 2.6 g/dL (calc) (ref 1.9–3.7)
Glucose, Bld: 84 mg/dL (ref 65–99)
Potassium: 5 mmol/L (ref 3.5–5.3)
Sodium: 142 mmol/L (ref 135–146)
Total Bilirubin: 0.6 mg/dL (ref 0.2–1.2)
Total Protein: 6.5 g/dL (ref 6.1–8.1)

## 2018-08-10 LAB — LIPID PANEL
Cholesterol: 140 mg/dL (ref <200)
HDL: 42 mg/dL (ref >=40)
LDL Cholesterol (Calc): 80 mg/dL (calc)
Non-HDL Cholesterol (Calc): 98 mg/dL (calc) (ref <130)
Total CHOL/HDL Ratio: 3.3 (calc) (ref <5.0)
Triglycerides: 92 mg/dL (ref <150)

## 2018-08-12 ENCOUNTER — Telehealth: Payer: Self-pay | Admitting: Family Medicine

## 2018-08-12 NOTE — Telephone Encounter (Signed)
-----   Message from [place], LPN sent at 7/58/3074  9:06 AM EDT ----- Regarding: RE: Please schedule visit with [facility]- needs oxygen recertified, Dr. Chase Caller Call placed to [facility]- Dr. Chase Caller. Appointment scheduled for 08/13/2018 @ 11am.   Call placed to patient and patient made aware. States that he cannot go to this appointment. Advised to call office to re-schedule. Aide will call back with updated appointment.  ----- Message ----- From: Alycia Rossetti, MD Sent: 08/09/2018  12:11 PM EDT To: [place] Lathe Six, LPN Subject: Please schedule visit with [facility]-#   Call home number

## 2018-08-13 ENCOUNTER — Ambulatory Visit: Payer: Medicare Other | Admitting: Pulmonary Disease

## 2018-08-16 ENCOUNTER — Telehealth: Payer: Self-pay | Admitting: Pulmonary Disease

## 2018-08-16 NOTE — Telephone Encounter (Signed)
Attempted to contact patient, home number does not work and mobile number keeps ringing busy. I do not see any documentation of anyone having requested a download. Will send message to OA to make aware patient has turned in his machine. Nothing further needed at this time.   OA, FYI, patient turned in his Bi-pap machine.

## 2018-08-18 NOTE — Telephone Encounter (Signed)
Aware  Will be glad to see him in follow-up as needed

## 2018-08-19 ENCOUNTER — Telehealth: Payer: Self-pay | Admitting: General Surgery

## 2018-08-19 ENCOUNTER — Other Ambulatory Visit: Payer: Self-pay

## 2018-08-19 ENCOUNTER — Encounter: Payer: Self-pay | Admitting: Pulmonary Disease

## 2018-08-19 ENCOUNTER — Ambulatory Visit (INDEPENDENT_AMBULATORY_CARE_PROVIDER_SITE_OTHER): Payer: Medicare Other | Admitting: Pulmonary Disease

## 2018-08-19 VITALS — BP 158/64 | HR 91 | Temp 98.3°F

## 2018-08-19 DIAGNOSIS — J84112 Idiopathic pulmonary fibrosis: Secondary | ICD-10-CM

## 2018-08-19 DIAGNOSIS — R918 Other nonspecific abnormal finding of lung field: Secondary | ICD-10-CM | POA: Diagnosis not present

## 2018-08-19 DIAGNOSIS — F17210 Nicotine dependence, cigarettes, uncomplicated: Secondary | ICD-10-CM

## 2018-08-19 DIAGNOSIS — F17200 Nicotine dependence, unspecified, uncomplicated: Secondary | ICD-10-CM

## 2018-08-19 DIAGNOSIS — G4733 Obstructive sleep apnea (adult) (pediatric): Secondary | ICD-10-CM

## 2018-08-19 NOTE — Assessment & Plan Note (Signed)
Plan: Emphasized the need for the patient to stop smoking Patient recently lost spouse and uses smoking as a way to cope with the stress and grief Patient to work on stopping smoking to get down to 0 cigarettes by next follow-up visit in October/2020 Patient use reduced to quit method

## 2018-08-19 NOTE — Assessment & Plan Note (Signed)
Intolerant of BiPAP therapy in January/2020 Patient returned BiPAP in February/2020 was intolerant Patient reports that he was intolerant of CPAP many years past Patient is not interested in resuming BiPAP or CPAP therapy  Plan: Continue oxygen therapy as prescribed We will continue to monitor patient sleep clinically as he has been intolerant of CPAP and BiPAP

## 2018-08-19 NOTE — Progress Notes (Signed)
 @Patient ID: [name], male    DOB: 03/28/1934, 84 y.o.   MRN: [id number]  Chief Complaint  Patient presents with  . Follow-up    IPF    Referring provider: Gerald, Kawanta F, MD  HPI:  84-year-old male former smoker followed in our office for pulmonary fibrosis, obstructive sleep apnea, insomnia  PMH: GERD, hypertension, dementia, insomnia, history of seizure, wheelchair-bound, protein calorie malnutrition Smoker/ Smoking History: Current everyday smoker.  5 to 10 cigarettes a day.  30-pack-year smoking history. Maintenance: None Pt of: Dr. Ramaswamy for pulmonary, Dr. Olalere for sleep  08/19/2018  - Visit   84-year-old male current every day smoker followed in our office for pulmonary fibrosis (felt to be clinical IPF) and severe obstructive sleep apnea.  Patient was started on BiPAP therapy in January/2020.  Patient was intolerant of using BiPAP and return this in February/2020.  Patient continues to be maintained on oxygen at night.  Patient has had some baseline mental status changes and has had some intermittent confusion.  Per primary care appointment on 08/09/2018 this is felt to be dementia.  Unfortunately patient spouse who is primary caregiver just recently passed away a few weeks ago.  Patient reporting today that his sister (Kathryn Baker) has been helping with management of his care.  Overall, patient reports that his breathing is about the same.  He is not noticing any sort of acute changes.  He does have a baseline cough with white sputum that he has every morning.  He reports that this cough progressively gets better throughout the day.   Tests:   09/18/2017-connective tissue work-up MPO/PR 3 ANCA antibodies-negative Sed rate-54 ACE level-9 ANA-negative Anti-DNA antibody-negative Rheumatoid factor-less than 14 CCP-less than 16 Sjogren's syndrome antibodies-negative ANCA screen with reflex titer-negative Aldolase-3.5 Hypersensitivity pneumonitis-negative CK  total and CK-MB-negative Anti-scleroderma antibody-negative  10/05/2017-CT chest high-resolution-spectrum of findings compatible with basilar predominant fibrotic interstitial lung disease with mild to moderate honeycombing possibly slightly progressed since 03/04/2017 CT chest angiogram study, findings considered UIP pattern, 7-month stability of 8 mm solid superior segment left lower lobe pulmonary nodule more likely benign, suggest follow-up chest CT in 12 months, ectatic 4.2 cm ascending thoracic aorta, stable, recommend annual imaging followed by CTA or MRA, three-vessel arthrosclerosis, mild mediastinal adenopathy is stable  03/04/2017- CT chest with contrast- patchy multifocal upper lobe predominant groundglass opacities involving the bilateral lungs as above suspicious for multifocal pneumonia, small layering right pleural effusion, bilateral pulmonary nodules measuring up to 8 mm as above,  01/24/2018-split-night sleep study- AHI 82.3 >>> Started on BiPAP therapy, patient returned machine and stopped BiPAP therapy as notified by our office on February/2020  10/11/2017-pulmonary function test- FVC 2.56 (73% predicted), ratio 75, FEV1 1.92 (76% predicted), DLCO 13.3 (42% predicted)  03/04/2017-echocardiogram-LV ejection fraction 55 to 60%, grade 1 diastolic dysfunction  FENO:  No results found for: NITRICOXIDE  PFT: PFT Results Latest Ref Rng & Units 10/11/2017 01/25/2017  FVC-Pre L 2.56 2.69  FVC-Predicted Pre % 73 77  FVC-Post L - 2.97  FVC-Predicted Post % - 85  Pre FEV1/FVC % % 75 79  Post FEV1/FCV % % - 80  FEV1-Pre L 1.92 2.12  FEV1-Predicted Pre % 76 83  FEV1-Post L - 2.38  DLCO UNC% % 42 46  DLCO COR %Predicted % 92 81  TLC L - 5.59  TLC % Predicted % - 81  RV % Predicted % - 116    Imaging: No results found.    Specialty Problems        Pulmonary Problems   Pulmonary nodules   IPF (idiopathic pulmonary fibrosis) (HCC)    09/18/2017-connective tissue work-up MPO/PR 3  ANCA antibodies-negative Sed rate-54 ACE level-9 ANA-negative Anti-DNA antibody-negative Rheumatoid factor-less than 14 CCP-less than 16 Sjogren's syndrome antibodies-negative ANCA screen with reflex titer-negative Aldolase-3.5 Hypersensitivity pneumonitis-negative CK total and CK-MB-negative Anti-scleroderma antibody-negative  10/05/2017-CT chest high-resolution-spectrum of findings compatible with basilar predominant fibrotic interstitial lung disease with mild to moderate honeycombing possibly slightly progressed since 03/04/2017 CT chest angiogram study, findings considered UIP pattern, 7-month stability of 8 mm solid superior segment left lower lobe pulmonary nodule more likely benign, suggest follow-up chest CT in 12 months, ectatic 4.2 cm ascending thoracic aorta, stable, recommend annual imaging followed by CTA or MRA, three-vessel arthrosclerosis, mild mediastinal adenopathy is stable  10/11/2017-pulmonary function test- FVC 2.56 (73% predicted), ratio 75, FEV1 1.92 (76% predicted), DLCO 13.3 (42% predicted)       OSA (obstructive sleep apnea)    01/24/2018-split-night sleep study- AHI 82.3 >>> Started on BiPAP therapy, patient returned machine and stopped BiPAP therapy as notified by our office on February/2020          Allergies  Allergen Reactions  . Aspirin Nausea And Vomiting    Tolerates 81mg daily    Immunization History  Administered Date(s) Administered  . Influenza, High Dose Seasonal PF 09/18/2017  . Influenza,inj,Quad PF,6+ Mos 10/02/2016  . Pneumococcal Conjugate-13 02/02/2017  . Pneumococcal Polysaccharide-23 11/14/1995  . Tdap 04/22/2017, 04/25/2017   Needs pneumonia vaccine at next office visit  Past Medical History:  Diagnosis Date  . Abnormality of gait 08/01/2013  . Arthritis   . Blindness of left eye    Posttraumatic  . Carotid artery disease (HCC)   . Colon polyp   . Constipation   . Diaphragmatic hernia   . Disc disease, degenerative,  cervical   . Diverticulitis   . ED (erectile dysfunction)   . Gastroparesis   . GERD (gastroesophageal reflux disease)   . Glaucoma   . Heart disease   . Hyperlipidemia   . Hypertension   . Insomnia   . Memory difficulty 08/04/2014  . Nicotine dependence   . Numbness and tingling of right arm 08/04/2014  . Osteoarthritis   . Overactive bladder   . Radiculopathy   . Shortness of breath    with activity  . Sleep apnea    does not use CPAP.  Tested maybe 5- 10 years ago  . Stroke (HCC)    Left side weakness.  . Unilateral inguinal hernia     Tobacco History: Social History   Tobacco Use  Smoking Status Current Some Day Smoker  . Packs/day: 0.50  . Years: 60.00  . Pack years: 30.00  . Types: Cigarettes  Smokeless Tobacco Never Used   Ready to quit: Yes Counseling given: Yes   Smoking assessment and cessation counseling  Patient currently smoking: 5-10 cigarettes daily  I have advised the patient to quit/stop smoking as soon as possible due to high risk for multiple medical problems.  It will also be very difficult for us to manage patient's  respiratory symptoms and status if we continue to expose her lungs to a known irritant.  We do not advise e-cigarettes as a form of stopping smoking.  Patient is  willing to quit smoking.  Has not set quit date.  I have advised the patient that we can assist and have options of nicotine replacement therapy, provided smoking cessation education today, provided smoking cessation counseling, and provided cessation   resources.  Patient to try to use reduced to quit method to stop smoking.  Follow-up next office visit office visit for assessment of smoking cessation.    Smoking cessation counseling advised for: 4 minutes    Outpatient Encounter Medications as of 08/19/2018  Medication Sig  . albuterol (PROVENTIL HFA;VENTOLIN HFA) 108 (90 Base) MCG/ACT inhaler Inhale 2 puffs into the lungs every 4 (four) hours as needed for wheezing or  shortness of breath.  [name] albuterol (PROVENTIL) (2.5 MG/3ML) 0.083% nebulizer solution USE 1 VIAL IN NEBULIZER EVERY 6 HOURS AS NEEDED FOR SHORTNESS OF BREATH AND WHEEZING  . aspirin EC 81 MG tablet Take 162 mg by mouth at bedtime.   . Cholecalciferol (VITAMIN D-3) 25 MCG (1000 UT) CAPS Take 1 capsule (1,000 Units total) by mouth daily.  . citalopram (CELEXA) 20 MG tablet TAKE 1 TABLET BY MOUTH ONCE A DAY FOR DEPRESSION.  [name] D-1000 EXTRA STRENGTH 25 MCG (1000 UT) tablet TAKE 1 TABLET BY MOUTH ONCE DAILY.  . fluticasone (FLONASE) 50 MCG/ACT nasal spray USE 1 OR 2 SPRAYS IN EACH NOSTRIL EVERY DAY FOR NASAL ALLERGIES.  [name] levETIRAcetam (KEPPRA) 250 MG tablet TAKE (1) TABLET BY MOUTH EACH MORNING.  [name] lisinopril (ZESTRIL) 10 MG tablet TAKE 1 TABLET BY MOUTH ONCE A DAY FOR HIGH BLOOD PRESSURE  . LORazepam (ATIVAN) 1 MG tablet TAKE (1) TABLET BY MOUTH AT BEDTIME.  [name] lubiprostone (AMITIZA) 24 MCG capsule TAKE 1 CAPSULE BY MOUTH TWICE DAILY WITH MEALS.  [name] mirabegron ER (MYRBETRIQ) 25 MG TB24 tablet Take 1 tablet (25 mg total) by mouth daily.  . naproxen sodium (ALEVE) 220 MG tablet Take 440 mg by mouth daily as needed.  [name] NITROSTAT 0.4 MG SL tablet PLACE 1 TAB UNDER TONGUE EVERY 5 MIN IF NEEDED FOR CHEST PAIN. MAY USE 3 TIMES.NO RELIEF CALL 911.  . omeprazole (PRILOSEC) 20 MG capsule 1 capsule daily  . Respiratory Therapy Supplies (NEBULIZER/TUBING/MOUTHPIECE) KIT Disp one nebulizer machine, tubing set and mouthpiece kit  . traZODone (DESYREL) 100 MG tablet TAKE (1) TABLET BY MOUTH AT BEDTIME.   No facility-administered encounter medications on file as of 08/19/2018.      Review of Systems  Review of Systems  Constitutional: Positive for fatigue. Negative for activity change, chills, fever and unexpected weight change.  HENT: Negative for postnasal drip, rhinorrhea, sinus pressure, sinus pain and sore throat.   Eyes: Negative.   Respiratory: Positive for cough. Negative for shortness of breath and  wheezing.   Cardiovascular: Negative for chest pain, palpitations and leg swelling.  Gastrointestinal: Negative for constipation, diarrhea, nausea and vomiting.  Endocrine: Negative.   Genitourinary: Negative.   Musculoskeletal:       Wheelchair-bound  Skin: Negative.   Neurological: Positive for weakness. Negative for dizziness and headaches.  Psychiatric/Behavioral: Negative.  Negative for dysphoric mood. The patient is not nervous/anxious.   All other systems reviewed and are negative.    Physical Exam  BP (!) 158/64 (BP Location: Left Arm, Patient Position: Sitting, Cuff Size: Normal)   Pulse 91   Temp 98.3 F (36.8 C)   SpO2 97%   Wt Readings from Last 5 Encounters:  11/13/17 170 lb (77.1 kg)  10/11/17 170 lb (77.1 kg)  09/18/17 165 lb (74.8 kg)  04/23/17 165 lb (74.8 kg)  03/05/17 159 lb 6.3 oz (72.3 kg)     Physical Exam Vitals signs and nursing note reviewed.  Constitutional:      General: He is not in acute distress.  Appearance: Normal appearance. He is normal weight.     Comments: Chronically ill elderly male  HENT:     Head: Normocephalic and atraumatic.     Right Ear: Hearing and external ear normal.     Left Ear: Hearing and external ear normal.     Mouth/Throat:     Mouth: Mucous membranes are dry.     Pharynx: Oropharynx is clear. No oropharyngeal exudate.  Eyes:     Pupils: Pupils are equal, round, and reactive to light.  Neck:     Musculoskeletal: Normal range of motion.  Cardiovascular:     Rate and Rhythm: Normal rate and regular rhythm.     Pulses: Normal pulses.     Heart sounds: Normal heart sounds. No murmur.  Pulmonary:     Effort: Pulmonary effort is normal.     Breath sounds: Normal breath sounds. No decreased breath sounds, wheezing or rales.  Abdominal:     General: Bowel sounds are normal. There is no distension.     Palpations: Abdomen is soft.     Tenderness: There is no abdominal tenderness.  Musculoskeletal:     Right  lower leg: No edema.     Left lower leg: No edema.     Comments: Wheelchair-bound  Lymphadenopathy:     Cervical: No cervical adenopathy.  Skin:    General: Skin is warm and dry.     Capillary Refill: Capillary refill takes less than 2 seconds.     Findings: No erythema or rash.  Neurological:     Mental Status: He is alert and oriented to person, place, and time. Mental status is at baseline.     Motor: Weakness present.     Coordination: Coordination abnormal.     Gait: Gait abnormal.     Comments: Wheelchair-bound  Psychiatric:        Mood and Affect: Affect is flat.        Speech: Speech normal.        Behavior: Behavior normal. Behavior is cooperative.        Thought Content: Thought content normal.        Cognition and Memory: Cognition is impaired. Memory is impaired.        Judgment: Judgment normal.      Lab Results:  CBC    Component Value Date/Time   WBC 5.6 08/09/2018 1208   RBC 4.38 08/09/2018 1208   HGB 13.6 08/09/2018 1208   HGB 12.7 10/22/2012   HCT 39.9 08/09/2018 1208   HCT 38 10/22/2012   PLT 170 08/09/2018 1208   MCV 91.1 08/09/2018 1208   MCH 31.1 08/09/2018 1208   MCHC 34.1 08/09/2018 1208   RDW 12.9 08/09/2018 1208   LYMPHSABS 2,010 08/09/2018 1208   MONOABS 1.0 04/22/2017 1449   EOSABS 560 (H) 08/09/2018 1208   BASOSABS 73 08/09/2018 1208    BMET    Component Value Date/Time   NA 142 08/09/2018 1208   K 5.0 08/09/2018 1208   CL 110 08/09/2018 1208   CO2 23 08/09/2018 1208   GLUCOSE 84 08/09/2018 1208   BUN 13 08/09/2018 1208   CREATININE 1.07 08/09/2018 1208   CALCIUM 9.1 08/09/2018 1208   GFRNONAA 78 10/04/2017 1034   GFRAA 90 10/04/2017 1034    BNP No results found for: BNP  ProBNP No results found for: PROBNP    Assessment & Plan:   IPF (idiopathic pulmonary fibrosis) (HCC) Plan: Repeat CT chest in October/2020 Follow-up with our office   in 3 months Work on increasing daily physical activity, suggested performing  arm exercises Continue oxygen therapy as prescribed Needs Pneumovax 23 at next office visit Flu vaccine in fall/2020  OSA (obstructive sleep apnea) Intolerant of BiPAP therapy in January/2020 Patient returned BiPAP in February/2020 was intolerant Patient reports that he was intolerant of CPAP many years past Patient is not interested in resuming BiPAP or CPAP therapy  Plan: Continue oxygen therapy as prescribed We will continue to monitor patient sleep clinically as he has been intolerant of CPAP and BiPAP  Abnormal findings on diagnostic imaging of lung Plan: Repeat CT chest without contrast in October/2020  Pulmonary nodules Plan: Repeat CT chest in October/2020  SMOKER Plan: Emphasized the need for the patient to stop smoking Patient recently lost spouse and uses smoking as a way to cope with the stress and grief Patient to work on stopping smoking to get down to 0 cigarettes by next follow-up visit in October/2020 Patient use reduced to quit method    Return in about 3 months (around 11/19/2018), or if symptoms worsen or fail to improve, for Follow up with Dr. Ramaswammy in ILD clinic, 30min slot .    P , NP 08/19/2018   This appointment was 30 minutes long with over 50% of the time in direct face-to-face patient care, assessment, plan of care, and follow-up.  This also includes face-to-face time with patient's family who brought patient office visit. 

## 2018-08-19 NOTE — Telephone Encounter (Signed)
Patient currently has appointment for today 08/19/18 with Benjamin Quaker, NP. Patient will notified of prn appointment with Dr. Ander Slade during visit. Nothing further needed at this time.

## 2018-08-19 NOTE — Assessment & Plan Note (Addendum)
Plan: Repeat CT chest in October/2020 Follow-up with our office in 3 months Work on increasing daily physical activity, suggested performing arm exercises Continue oxygen therapy as prescribed Needs Pneumovax 23 at next office visit Flu vaccine in fall/2020

## 2018-08-19 NOTE — Telephone Encounter (Signed)
LVMTCB with Respiratory Department with [facility] to ask if they can call back to confirm if they received the BIPAP order that was placed 01/29/18. We were advised the BIPAP the patient originally had was returned in 2018. Asking for confirmation that a new BIPAP was or was not issued based on the order sent in January 2020.  Patient seen today 08/19/18 by Wyn Quaker, NP and patient during his visit that he does have an oxygen concentrator at home.  When calling to communicate with the patient, ask for his sister Festus Holts.

## 2018-08-19 NOTE — Assessment & Plan Note (Signed)
Plan: Repeat CT chest in October/2020

## 2018-08-19 NOTE — Telephone Encounter (Signed)
Erica with [employer] called back and stated they did receive the order placed on 01/30/18. They spoke with the patient's wife on 02/11/18 who said the patient was not wanting to use the machine and there was no changing his mind. The machine was returned 02/20/18. Nothing further needed at this time.

## 2018-08-19 NOTE — Assessment & Plan Note (Signed)
Plan: Repeat CT chest without contrast in October/2020

## 2018-08-19 NOTE — Patient Instructions (Addendum)
You were seen today by Lauraine Rinne, NP  for:  1. Abnormal findings on diagnostic imaging of lung  We will order a CT to be completed in October/2020, preferably at the [facility] facility - CT Chest Wo Contrast; Future  2. Pulmonary nodules  - CT Chest Wo Contrast; Future  3. IPF (idiopathic pulmonary fibrosis) (HCC)  - CT Chest Wo Contrast; Future  Follow-up with our office in 3 months after completing your CT of your chest  Work on increasing daily physical activity, start doing arm exercises  4. SMOKER  - CT Chest Wo Contrast; Future  We recommend that you stop smoking.  >>>You need to set a quit date >>>If you have friends or family who smoke, let them know you are trying to quit and not to smoke around you or in your living environment  Smoking Cessation Resources:  1 800 QUIT NOW  >>> Patient to call this resource and utilize it to help support her quit smoking >>> Keep up your hard work with stopping smoking  You can also contact the [facility] >>>For smoking cessation classes call [phone]  We do not recommend using e-cigarettes as a form of stopping smoking  You can sign up for smoking cessation support texts and information:  >>>https://smokefree.gov/smokefreetxt    5. OSA (obstructive sleep apnea)  We will continue to monitor this clinically as you did not tolerate BiPAP  Continue oxygen therapy at night   We recommend today:  Orders Placed This Encounter  Procedures  . CT Chest Wo Contrast    Complete in 10/2018 [facility] imaging    Standing Status:   Future    Standing Expiration Date:   10/19/2019    Order Specific Question:   Preferred imaging location?    Answer:   [facility]    Order Specific Question:   Radiology Contrast Protocol - do NOT remove file path    Answer:   [email or web address]   Orders Placed This Encounter  Procedures  . CT Chest Wo Contrast   No orders of the defined  types were placed in this encounter.   Follow Up:    Return in about 3 months (around 11/19/2018), or if symptoms worsen or fail to improve, for Follow up with Dr. Purnell Shoemaker in ILD clinic, 64min slot .   Please do your part to reduce the spread of COVID-19:      Reduce your risk of any infection  and COVID19 by using the similar precautions used for avoiding the common cold or flu:  [name] Wash your hands often with soap and warm water for at least 20 seconds.  If soap and water are not readily available, use an alcohol-based hand sanitizer with at least 60% alcohol.  . If coughing or sneezing, cover your mouth and nose by coughing or sneezing into the elbow areas of your shirt or coat, into a tissue or into your sleeve (not your hands). Langley Gauss A MASK when in public  . Avoid shaking hands with others and consider head nods or verbal greetings only. . Avoid touching your eyes, nose, or mouth with unwashed hands.  . Avoid close contact with people who are sick. . Avoid places or events with large numbers of people in one location, like concerts or sporting events. . If you have some symptoms but not all symptoms, continue to monitor at home and seek medical attention if your symptoms worsen. . If you are having a medical  emergency, call 911.   [place] / e-Visit: [email or web address]         [facility]: [facility]: [phone]                   [facility]: [phone]     It is flu season:   >>> Best ways to protect herself from the flu: Receive the yearly flu vaccine, practice good hand hygiene washing with soap and also using hand sanitizer when available, eat a nutritious meals, get adequate rest, hydrate appropriately   Please contact the office if your symptoms worsen or you have concerns that you are not improving.    Thank you for choosing [facility] for your healthcare, and for allowing [place] to partner with you on your healthcare journey. I am thankful to be able to provide care to you today.   Wyn Quaker FNP-C

## 2018-08-20 ENCOUNTER — Telehealth: Payer: Self-pay | Admitting: Pulmonary Disease

## 2018-08-20 NOTE — Telephone Encounter (Signed)
Attempted to call pt's aide [name] but unable to reach. Left message for her to return call.    We need to know if pt is only wearing the O2 at night which I did see in pt's OV he had yesterday 8/17 with Aaron Edelman or if pt is wearing it during the day as well. Also need to know how many liters pt is wearing so that we can send all this info to [employer].

## 2018-08-21 NOTE — Telephone Encounter (Signed)
LMTCB x2 for [name].

## 2018-08-22 ENCOUNTER — Other Ambulatory Visit: Payer: Self-pay | Admitting: Family Medicine

## 2018-08-22 DIAGNOSIS — L110 Acquired keratosis follicularis: Secondary | ICD-10-CM | POA: Diagnosis not present

## 2018-08-22 DIAGNOSIS — I739 Peripheral vascular disease, unspecified: Secondary | ICD-10-CM | POA: Diagnosis not present

## 2018-08-22 DIAGNOSIS — B351 Tinea unguium: Secondary | ICD-10-CM | POA: Diagnosis not present

## 2018-08-22 NOTE — Telephone Encounter (Signed)
LMTCB x3 for [name]. We have attempted to contact [name] several times with no success or call back from her. Per triage protocol, message will be closed.

## 2018-08-24 DIAGNOSIS — J84112 Idiopathic pulmonary fibrosis: Secondary | ICD-10-CM | POA: Diagnosis not present

## 2018-08-24 DIAGNOSIS — J449 Chronic obstructive pulmonary disease, unspecified: Secondary | ICD-10-CM | POA: Diagnosis not present

## 2018-08-29 ENCOUNTER — Telehealth: Payer: Self-pay | Admitting: Pulmonary Disease

## 2018-08-29 DIAGNOSIS — G4733 Obstructive sleep apnea (adult) (pediatric): Secondary | ICD-10-CM | POA: Diagnosis not present

## 2018-08-29 DIAGNOSIS — R062 Wheezing: Secondary | ICD-10-CM | POA: Diagnosis not present

## 2018-08-29 DIAGNOSIS — J449 Chronic obstructive pulmonary disease, unspecified: Secondary | ICD-10-CM | POA: Diagnosis not present

## 2018-08-29 DIAGNOSIS — J84112 Idiopathic pulmonary fibrosis: Secondary | ICD-10-CM | POA: Diagnosis not present

## 2018-08-29 NOTE — Telephone Encounter (Signed)
Called and spoke to pt's aide, [name]. She states the pt doesn't feel the O2 coming out of the cannula. [name] to check the tubing for holes and kinks and to check the hook up to make sure the tubing is attached well and to also check the flow herself to see if its coming out. [name] verbalized understanding and states she would call back if there are issues. Just also asked if we informed [place] of the pts O2 needs (see phone note from 8/18). [name] confirmed the pt is just using the O2 at night.   [facility] and was advised they do not need any additional information about his O2 requirement. Nothing further needed at this time.

## 2018-09-10 DIAGNOSIS — M4800 Spinal stenosis, site unspecified: Secondary | ICD-10-CM | POA: Diagnosis not present

## 2018-09-10 DIAGNOSIS — J449 Chronic obstructive pulmonary disease, unspecified: Secondary | ICD-10-CM | POA: Diagnosis not present

## 2018-09-17 ENCOUNTER — Other Ambulatory Visit: Payer: Self-pay | Admitting: Family Medicine

## 2018-09-19 DIAGNOSIS — Z961 Presence of intraocular lens: Secondary | ICD-10-CM | POA: Diagnosis not present

## 2018-09-24 DIAGNOSIS — J84112 Idiopathic pulmonary fibrosis: Secondary | ICD-10-CM | POA: Diagnosis not present

## 2018-09-24 DIAGNOSIS — J449 Chronic obstructive pulmonary disease, unspecified: Secondary | ICD-10-CM | POA: Diagnosis not present

## 2018-09-30 ENCOUNTER — Other Ambulatory Visit: Payer: Self-pay | Admitting: Internal Medicine

## 2018-10-03 ENCOUNTER — Ambulatory Visit: Payer: Medicare Other

## 2018-10-08 ENCOUNTER — Ambulatory Visit (INDEPENDENT_AMBULATORY_CARE_PROVIDER_SITE_OTHER): Payer: Medicare Other

## 2018-10-08 ENCOUNTER — Other Ambulatory Visit: Payer: Self-pay

## 2018-10-08 DIAGNOSIS — Z23 Encounter for immunization: Secondary | ICD-10-CM | POA: Diagnosis not present

## 2018-10-18 ENCOUNTER — Other Ambulatory Visit: Payer: Self-pay | Admitting: Family Medicine

## 2018-10-18 ENCOUNTER — Ambulatory Visit (HOSPITAL_COMMUNITY): Payer: Medicare Other

## 2018-10-22 ENCOUNTER — Other Ambulatory Visit: Payer: Self-pay | Admitting: Pulmonary Disease

## 2018-10-22 ENCOUNTER — Other Ambulatory Visit: Payer: Self-pay

## 2018-10-22 ENCOUNTER — Ambulatory Visit (HOSPITAL_COMMUNITY)
Admission: RE | Admit: 2018-10-22 | Discharge: 2018-10-22 | Disposition: A | Discharge status: Home / Self Care | Payer: Medicare Other | Source: Ambulatory Visit | Attending: Pulmonary Disease | Admitting: Pulmonary Disease

## 2018-10-22 DIAGNOSIS — R918 Other nonspecific abnormal finding of lung field: Secondary | ICD-10-CM | POA: Insufficient documentation

## 2018-10-22 DIAGNOSIS — F17200 Nicotine dependence, unspecified, uncomplicated: Secondary | ICD-10-CM

## 2018-10-22 DIAGNOSIS — J84112 Idiopathic pulmonary fibrosis: Secondary | ICD-10-CM | POA: Insufficient documentation

## 2018-10-22 DIAGNOSIS — J984 Other disorders of lung: Secondary | ICD-10-CM | POA: Diagnosis not present

## 2018-10-22 DIAGNOSIS — J849 Interstitial pulmonary disease, unspecified: Secondary | ICD-10-CM | POA: Diagnosis not present

## 2018-10-22 NOTE — Progress Notes (Signed)
High-resolution CT chest showed progression of fibrosis.  Still showing solid 8 mm left lower lobe pulmonary nodule.  This is stable since March/2019 and considered benign.  This is good news.  Please ensure the patient is scheduled with Dr. Chase Caller in November/2020 in a 30-minute ILD clinic spot to further review CT imaging follow-up.  Wyn Quaker, FNP

## 2018-10-24 DIAGNOSIS — J449 Chronic obstructive pulmonary disease, unspecified: Secondary | ICD-10-CM | POA: Diagnosis not present

## 2018-10-24 DIAGNOSIS — J84112 Idiopathic pulmonary fibrosis: Secondary | ICD-10-CM | POA: Diagnosis not present

## 2018-11-04 ENCOUNTER — Telehealth: Payer: Self-pay | Admitting: Pulmonary Disease

## 2018-11-04 NOTE — Progress Notes (Signed)
See previous note

## 2018-11-04 NOTE — Telephone Encounter (Signed)
Called and spoke with Patient.  Results and recommendations given to Patient.  Results and recommendations given to home aide per Patient request.  Home Aide stated she would have to follow up with Patient PCP about upcoming appointment date/time.  Patient and Home Aide instructed to schedule 30 min ILD appointment with MR in November. Home Aide stated she would have to call back to schedule OV. Will leave open until OV scheduled with MR.  Per Aaron Edelman, NP-  Notes recorded by Lauraine Rinne, NP on 10/22/2018 at 2:11 PM EDT  High-resolution CT chest showed progression of fibrosis. Still showing solid 8 mm left lower lobe pulmonary nodule. This is stable since March/2019 and considered benign. This is good news.   Please ensure the patient is scheduled with Dr. Chase Caller in November/2020 in a 30-minute ILD clinic spot to further review CT imaging follow-up.   Wyn Quaker, FNP

## 2018-11-05 NOTE — Telephone Encounter (Signed)
Called and spoke with Patient. Patient scheduled 11/25/18, with Dr Chase Caller ILD clinic. Nothing further at this time.

## 2018-11-08 ENCOUNTER — Other Ambulatory Visit: Payer: Self-pay | Admitting: Family Medicine

## 2018-11-15 DIAGNOSIS — I10 Essential (primary) hypertension: Secondary | ICD-10-CM | POA: Diagnosis not present

## 2018-11-19 ENCOUNTER — Telehealth: Payer: Self-pay | Admitting: *Deleted

## 2018-11-19 DIAGNOSIS — Z743 Need for continuous supervision: Secondary | ICD-10-CM | POA: Diagnosis not present

## 2018-11-19 DIAGNOSIS — I10 Essential (primary) hypertension: Secondary | ICD-10-CM | POA: Diagnosis not present

## 2018-11-19 MED ORDER — LISINOPRIL 20 MG PO TABS: 20.0000 mg | ORAL_TABLET | Freq: Every day | ORAL | 3 refills | Status: DC

## 2018-11-19 NOTE — Telephone Encounter (Signed)
Received call from patient caregiver, [name].   Reports that patient BP has been elevated x2 days. Sates that systolic noted [id number] on [id number]. BP today noted at 198/84. EMS was called and patient evaluated at home. Was advised to F/U with PCP for medication adjustment.   Appointment scheduled for 11/20/2018. Patient is currently taking Lisinopril 10mg  PO QD.   MD please advise.

## 2018-11-19 NOTE — Telephone Encounter (Signed)
Patient medication is bubble packed and patient will not be able to distinguish which medication to take.   MD made aware and advised to add Lisinopril 20mg . Advised to take medication as soon as available, and then to take another in addition to Lisinopril in bubble pack for total of Lisinopril 30mg .   Prescription sent to pharmacy. Pharmacy will deliver to patient this afternoon.

## 2018-11-19 NOTE — Telephone Encounter (Signed)
Give another 10mg  of lisinopril today, and in the morning give him 2 tablets ( 20mg )

## 2018-11-20 ENCOUNTER — Encounter: Payer: Self-pay | Admitting: Family Medicine

## 2018-11-20 ENCOUNTER — Other Ambulatory Visit: Payer: Self-pay

## 2018-11-20 ENCOUNTER — Ambulatory Visit (INDEPENDENT_AMBULATORY_CARE_PROVIDER_SITE_OTHER): Payer: Medicare Other | Admitting: Family Medicine

## 2018-11-20 VITALS — BP 150/84 | HR 66 | Temp 98.7°F | Resp 14

## 2018-11-20 DIAGNOSIS — I10 Essential (primary) hypertension: Secondary | ICD-10-CM | POA: Diagnosis not present

## 2018-11-20 DIAGNOSIS — M25511 Pain in right shoulder: Secondary | ICD-10-CM | POA: Diagnosis not present

## 2018-11-20 NOTE — Patient Instructions (Signed)
Continue lisinopril 20mg  in the morning  We will call with lab results F/U as previous

## 2018-11-20 NOTE — Assessment & Plan Note (Signed)
Unclear cause why his blood pressure spiked up although he was asymptomatic.  Advised her to not give him Tylenol PM just use acetaminophen by itself for any shoulder discomfort.  His range of motion is fair but he is not complaining of any significant pain on exam today we will hold off on imaging.  I will check his renal function as well as a TSH to see if something is gone awry.  He will continue the lisinopril 30 mg a day.  He will follow-up as already scheduled in about 4 weeks and we will recheck his blood pressure then as well as his renal function to make sure he is tolerating the lisinopril.  He is 10 we will continue to spot check his blood pressure as well and call if his readings are consistently staying greater than 140/90

## 2018-11-20 NOTE — Progress Notes (Signed)
Subjective:    Patient ID: [name], male    DOB: [birth year], 84 y.o.   MRN: [id number]  Patient presents for HTN (elevated BP)  Patient here with his aide.  She was just routinely checking his blood pressure on Monday and it was noted to have a systolic around [id number].  He did not have any symptoms at that time.  He was on lisinopril 10 mg once a day.  She thought maybe was a blood pressure cuff that she had just bought a new wrist cuff so that he can take it himself.  She tried to other different cuffs and his blood pressure was still elevated.  May decide to wait and see if it was because he was in some discomfort he can complaining of some right shoulder pain which she thought occurred after she helped lift him.  She will he was given Tylenol and pain did ease off.  The next day he checked his blood pressure again and it was still elevated around A999333 systolic so EMS was called.  At the scene his blood pressure was 198/84 but again he was asymptomatic seems EKG was run in the field that was normal.  Advised him to call our office.  I added lisinopril 20 mg to him last night in order to give some coverage as he could not discern which lisinopril was in his pill pack.  He did take this last night he also took a total of 30 mg of lisinopril this morning There have been no other changes with the exception of Tylenol PM which was given due to the shoulder pain the past few nights.  still using 2L at night of oxygen  Today he does state that his right shoulder aches a little bit but it is not severe he is still able to use his arm like he typically does.  Review Of Systems:  GEN- denies fatigue, fever, weight loss,weakness, recent illness HEENT- denies eye drainage, change in vision, nasal discharge, CVS- denies chest pain, palpitations RESP- denies SOB, cough, wheeze ABD- denies N/V, change in stools, abd pain GU- denies dysuria, hematuria, dribbling, incontinence MSK- + joint pain, muscle aches,  injury Neuro- denies headache, dizziness, syncope, seizure activity       Objective:    BP (!) 150/84 (BP Location: Left Arm, Patient Position: Sitting, Cuff Size: Normal)   Pulse 66   Temp 98.7 F (37.1 C) (Temporal)   Resp 14   SpO2 97%  GEN- NAD, alert and oriented x3, sitting in wheelchair HEENT- PERRL, EOMI, non injected sclera, pink conjunctiva, MMM, oropharynx clear Neck- Supple, no thyromegaly CVS- RRR, no murmur RESP-CTAB ABD-NABS,soft,NT,ND MSK-Fair ROm bilat Upper ext, biceps in tact, neg empty can, mild TTP near [facility] right shoulder EXT- No edema Pulses- Radial, 2+        Assessment & Plan:      Problem List Items Addressed This Visit      Unprioritized   Essential hypertension - Primary    Unclear cause why his blood pressure spiked up although he was asymptomatic.  Advised her to not give him Tylenol PM just use acetaminophen by itself for any shoulder discomfort.  His range of motion is fair but he is not complaining of any significant pain on exam today we will hold off on imaging.  I will check his renal function as well as a TSH to see if something is gone awry.  He will continue the lisinopril 30 mg a day.  He will follow-up as already scheduled in about 4 weeks and we will recheck his blood pressure then as well as his renal function to make sure he is tolerating the lisinopril.  He is 10 we will continue to spot check his blood pressure as well and call if his readings are consistently staying greater than 140/90      Relevant Orders   CBC with Differential   Comprehensive metabolic panel   TSH    Other Visit Diagnoses    Acute pain of right shoulder       He likley has Underlying OA, no tenderness over humerous, no deformity noted one exam, use tylenol prn pain      Note: This dictation was prepared with Dragon dictation along with smaller phrase technology. Any transcriptional errors that result from this process are unintentional.

## 2018-11-21 ENCOUNTER — Other Ambulatory Visit: Payer: Self-pay | Admitting: Family Medicine

## 2018-11-21 LAB — COMPREHENSIVE METABOLIC PANEL
AG Ratio: 1.3 (calc) (ref 1.0–2.5)
ALT: 8 U/L — ABNORMAL LOW (ref 9–46)
AST: 15 U/L (ref 10–35)
Albumin: 4.1 g/dL (ref 3.6–5.1)
Alkaline phosphatase (APISO): 116 U/L (ref 35–144)
BUN: 11 mg/dL (ref 7–25)
CO2: 24 mmol/L (ref 20–32)
Calcium: 9.4 mg/dL (ref 8.6–10.3)
Chloride: 106 mmol/L (ref 98–110)
Creat: 0.97 mg/dL (ref 0.70–1.11)
Globulin: 3.1 g/dL (calc) (ref 1.9–3.7)
Glucose, Bld: 71 mg/dL (ref 65–99)
Potassium: 4.3 mmol/L (ref 3.5–5.3)
Sodium: 140 mmol/L (ref 135–146)
Total Bilirubin: 0.5 mg/dL (ref 0.2–1.2)
Total Protein: 7.2 g/dL (ref 6.1–8.1)

## 2018-11-21 LAB — CBC WITH DIFFERENTIAL/PLATELET
Absolute Monocytes: 495 cells/uL (ref 200–950)
Basophils Absolute: 72 cells/uL (ref 0–200)
Basophils Relative: 1.3 %
Eosinophils Absolute: 545 cells/uL — ABNORMAL HIGH (ref 15–500)
Eosinophils Relative: 9.9 %
HCT: 42.8 % (ref 38.5–50.0)
Hemoglobin: 14.6 g/dL (ref 13.2–17.1)
Lymphs Abs: 1925 cells/uL (ref 850–3900)
MCH: 31.1 pg (ref 27.0–33.0)
MCHC: 34.1 g/dL (ref 32.0–36.0)
MCV: 91.1 fL (ref 80.0–100.0)
MPV: 10.3 fL (ref 7.5–12.5)
Monocytes Relative: 9 %
Neutro Abs: 2464 cells/uL (ref 1500–7800)
Neutrophils Relative %: 44.8 %
Platelets: 206 10*3/uL (ref 140–400)
RBC: 4.7 10*6/uL (ref 4.20–5.80)
RDW: 12.9 % (ref 11.0–15.0)
Total Lymphocyte: 35 %
WBC: 5.5 10*3/uL (ref 3.8–10.8)

## 2018-11-21 LAB — TSH: TSH: 1.53 mIU/L (ref 0.40–4.50)

## 2018-11-21 NOTE — Telephone Encounter (Signed)
Ok to refill??  Last office visit 11/20/2018.  Last refill 07/25/2018, 33 refills.

## 2018-11-22 ENCOUNTER — Telehealth: Payer: Self-pay | Admitting: *Deleted

## 2018-11-22 NOTE — Telephone Encounter (Signed)
Continue the same dose, lets see if bp continues to trend down Call us On Wed with BP readings  He has f/u next month

## 2018-11-22 NOTE — Telephone Encounter (Signed)
Received call from patient aide, [name].   Reports that patient BP readings are as follows: 11/19- 166/77 11/20- 176/99  Reports that patient is asymptomatic.   MD please advise.

## 2018-11-22 NOTE — Telephone Encounter (Signed)
Call placed to patient and patient made aware.  

## 2018-11-24 DIAGNOSIS — J449 Chronic obstructive pulmonary disease, unspecified: Secondary | ICD-10-CM | POA: Diagnosis not present

## 2018-11-24 DIAGNOSIS — J84112 Idiopathic pulmonary fibrosis: Secondary | ICD-10-CM | POA: Diagnosis not present

## 2018-11-25 ENCOUNTER — Ambulatory Visit: Payer: Medicare Other | Admitting: Internal Medicine

## 2018-11-27 ENCOUNTER — Telehealth: Payer: Self-pay | Admitting: *Deleted

## 2018-11-27 MED ORDER — AMLODIPINE BESYLATE 5 MG PO TABS: 5.0000 mg | ORAL_TABLET | Freq: Every day | ORAL | 3 refills | Status: DC

## 2018-11-27 NOTE — Telephone Encounter (Signed)
Add norvasc 5mg  once a day- evening  Continue lisinopril

## 2018-11-27 NOTE — Telephone Encounter (Signed)
Call placed to patient and patient made aware.   Prescription sent to pharmacy.  

## 2018-11-27 NOTE — Telephone Encounter (Signed)
Received call from [facility], [place] [phone] telephone.   Reports that patient BP readings are as follows:  AM PM  23-Nov 144/75 179/80  24-Nov 180/85 174/87  25-Nov 142/66    Reports that patient is currently taking Lisinopril 30mg  PO QD.   MD to be made aware.

## 2018-12-11 ENCOUNTER — Telehealth: Payer: Self-pay | Admitting: Family Medicine

## 2018-12-11 DIAGNOSIS — R29810 Facial weakness: Secondary | ICD-10-CM | POA: Diagnosis not present

## 2018-12-11 DIAGNOSIS — Q211 Atrial septal defect: Secondary | ICD-10-CM | POA: Diagnosis not present

## 2018-12-11 DIAGNOSIS — I6523 Occlusion and stenosis of bilateral carotid arteries: Secondary | ICD-10-CM | POA: Diagnosis not present

## 2018-12-11 DIAGNOSIS — R4701 Aphasia: Secondary | ICD-10-CM | POA: Diagnosis not present

## 2018-12-11 DIAGNOSIS — E785 Hyperlipidemia, unspecified: Secondary | ICD-10-CM | POA: Diagnosis not present

## 2018-12-11 DIAGNOSIS — Z01818 Encounter for other preprocedural examination: Secondary | ICD-10-CM | POA: Diagnosis not present

## 2018-12-11 DIAGNOSIS — I6389 Other cerebral infarction: Secondary | ICD-10-CM | POA: Diagnosis not present

## 2018-12-11 DIAGNOSIS — R29707 NIHSS score 7: Secondary | ICD-10-CM | POA: Diagnosis not present

## 2018-12-11 DIAGNOSIS — Z8673 Personal history of transient ischemic attack (TIA), and cerebral infarction without residual deficits: Secondary | ICD-10-CM | POA: Diagnosis not present

## 2018-12-11 DIAGNOSIS — J984 Other disorders of lung: Secondary | ICD-10-CM | POA: Diagnosis not present

## 2018-12-11 DIAGNOSIS — Z135 Encounter for screening for eye and ear disorders: Secondary | ICD-10-CM | POA: Diagnosis not present

## 2018-12-11 DIAGNOSIS — Z4682 Encounter for fitting and adjustment of non-vascular catheter: Secondary | ICD-10-CM | POA: Diagnosis not present

## 2018-12-11 DIAGNOSIS — I63412 Cerebral infarction due to embolism of left middle cerebral artery: Secondary | ICD-10-CM | POA: Diagnosis not present

## 2018-12-11 DIAGNOSIS — Z888 Allergy status to other drugs, medicaments and biological substances status: Secondary | ICD-10-CM | POA: Diagnosis not present

## 2018-12-11 DIAGNOSIS — I639 Cerebral infarction, unspecified: Secondary | ICD-10-CM | POA: Diagnosis not present

## 2018-12-11 DIAGNOSIS — E7800 Pure hypercholesterolemia, unspecified: Secondary | ICD-10-CM | POA: Diagnosis not present

## 2018-12-11 DIAGNOSIS — R29818 Other symptoms and signs involving the nervous system: Secondary | ICD-10-CM | POA: Diagnosis not present

## 2018-12-11 DIAGNOSIS — Z886 Allergy status to analgesic agent status: Secondary | ICD-10-CM | POA: Diagnosis not present

## 2018-12-11 DIAGNOSIS — Z79899 Other long term (current) drug therapy: Secondary | ICD-10-CM | POA: Diagnosis not present

## 2018-12-11 DIAGNOSIS — N3281 Overactive bladder: Secondary | ICD-10-CM | POA: Diagnosis not present

## 2018-12-11 DIAGNOSIS — E538 Deficiency of other specified B group vitamins: Secondary | ICD-10-CM | POA: Diagnosis not present

## 2018-12-11 DIAGNOSIS — I10 Essential (primary) hypertension: Secondary | ICD-10-CM | POA: Diagnosis not present

## 2018-12-11 DIAGNOSIS — I63312 Cerebral infarction due to thrombosis of left middle cerebral artery: Secondary | ICD-10-CM | POA: Diagnosis not present

## 2018-12-11 DIAGNOSIS — R1310 Dysphagia, unspecified: Secondary | ICD-10-CM | POA: Diagnosis not present

## 2018-12-11 NOTE — Telephone Encounter (Signed)
I spoke with patient stepdaughter Erline Levine.  Phone [phone].  I also spoke with patient's sister Ms. Baker noted in the chart.  He is currently at [facility] he did suffer with a stroke in the middle the night.  He is currently aphasic and has significant dysphagia.  He had a left frontal infarct.  Neurology was consulted they did not recommend TPA.  He had to have NG tube placed because he failed his swallow study.  He has significant stenosis on his CT angiogram and the left carotid.  He will be referred admitted to the stroke unit for further evaluation.  His stepdaughter Kieth Brightly did speak to the ER physician at [facility].  He will either be staying at [facility] or transferred to [facility] stroke unit.  Unfortunately [facility] does not have any beds available.  Per the family his stepdaughter Alfonzo Beers has power of attorney they will bring in documents for this.  He also has a DNR they will bring me copies of.  They will also provide copies for the hospital.

## 2018-12-11 NOTE — Telephone Encounter (Signed)
Care giver judy calling to say that [name] was admitted to the hospital ([facility]) and that last she heard he was being transferred to [facility] for possible stroke  [phone]

## 2018-12-12 MED ORDER — CVS KIDPANT BOYS X-LARGE MISC
40.00 | Status: DC
Start: 2018-12-12 — End: 2018-12-12

## 2018-12-12 MED ORDER — AMPHOTER B CHOLEST SULF CMPLX
81.00 | Status: DC
Start: 2018-12-13 — End: 2018-12-12

## 2018-12-12 MED ORDER — DERMAKLENZ EX
100.00 | CUTANEOUS | Status: DC
Start: 2018-12-13 — End: 2018-12-12

## 2018-12-12 MED ORDER — ONE-A-DAY WITHIN PO
30.00 | ORAL | Status: DC
Start: ? — End: 2018-12-12

## 2018-12-12 MED ORDER — ZOO FRIENDS PLUS IRON PO
100.00 | ORAL | Status: DC
Start: 2018-12-12 — End: 2018-12-12

## 2018-12-12 MED ORDER — ALOXI 0.25 MG/5ML IV SOLN
20.00 | INTRAVENOUS | Status: DC
Start: 2018-12-13 — End: 2018-12-12

## 2018-12-12 MED ORDER — Medication
Status: DC
Start: ? — End: 2018-12-12

## 2018-12-12 MED ORDER — DOLGIC PLUS 50-750-40 MG PO TABS
10.00 | ORAL_TABLET | ORAL | Status: DC
Start: 2018-12-12 — End: 2018-12-12

## 2018-12-12 MED ORDER — SUBDUE PLUS PO LIQD
75.00 | ORAL | Status: DC
Start: 2018-12-13 — End: 2018-12-12

## 2018-12-12 MED ORDER — ALUM HYDROXIDE-MAG TRISILICATE 80-20 MG PO CHEW
1000.00 | CHEWABLE_TABLET | ORAL | Status: DC
Start: ? — End: 2018-12-12

## 2018-12-12 MED ORDER — CVS EAR DROPS OT
40.00 | OTIC | Status: DC
Start: 2018-12-13 — End: 2018-12-12

## 2018-12-12 MED ORDER — GENTAK 0.3 % OP SOLN
1.00 | OPHTHALMIC | Status: DC
Start: 2018-12-13 — End: 2018-12-12

## 2018-12-12 MED ORDER — EQUATE NICOTINE 4 MG MT GUM
4.00 | CHEWING_GUM | OROMUCOSAL | Status: DC
Start: ? — End: 2018-12-12

## 2018-12-12 MED ORDER — CHLOROPHYLL EX
10.00 | CUTANEOUS | Status: DC
Start: ? — End: 2018-12-12

## 2018-12-12 NOTE — Telephone Encounter (Signed)
Received call from patient step- daughter Kieth Brightly. Reports that patient was transferred to [facility]. Reports that medical records are required for [facility]. Also received call from [place], [place] at [facility] [facility]. Reports that information about immunizations is required.   Advised of recent immunizations for Flu, PNA, and TDaP.

## 2018-12-14 DIAGNOSIS — I63412 Cerebral infarction due to embolism of left middle cerebral artery: Secondary | ICD-10-CM | POA: Insufficient documentation

## 2018-12-17 ENCOUNTER — Encounter: Payer: Medicare Other | Admitting: Family Medicine

## 2018-12-19 MED ORDER — PAROXETINE HCL 20 MG PO TABS
40.00 | ORAL_TABLET | ORAL | Status: DC
Start: 2018-12-19 — End: 2018-12-19

## 2018-12-19 MED ORDER — FLUTICASONE PROPIONATE 50 MCG/ACT NA SUSP
1.00 | NASAL | Status: DC
Start: ? — End: 2018-12-19

## 2018-12-19 MED ORDER — DORZOLAMIDE HCL 2 % OP SOLN
1.00 | OPHTHALMIC | Status: DC
Start: 2018-12-19 — End: 2018-12-19

## 2018-12-19 MED ORDER — ALBUTEROL SULFATE (2.5 MG/3ML) 0.083% IN NEBU
2.50 | INHALATION_SOLUTION | RESPIRATORY_TRACT | Status: DC
Start: ? — End: 2018-12-19

## 2018-12-19 MED ORDER — LISINOPRIL 20 MG PO TABS
20.00 | ORAL_TABLET | ORAL | Status: DC
Start: 2018-12-19 — End: 2018-12-19

## 2018-12-19 MED ORDER — MIRABEGRON ER 25 MG PO TB24
25.00 | ORAL_TABLET | ORAL | Status: DC
Start: 2018-12-19 — End: 2018-12-19

## 2018-12-19 MED ORDER — LORAZEPAM 1 MG PO TABS
1.00 | ORAL_TABLET | ORAL | Status: DC
Start: 2018-12-18 — End: 2018-12-19

## 2018-12-19 MED ORDER — BRIMONIDINE TARTRATE 0.2 % OP SOLN
1.00 | OPHTHALMIC | Status: DC
Start: 2018-12-19 — End: 2018-12-19

## 2018-12-19 MED ORDER — LINACLOTIDE 145 MCG PO CAPS
145.00 | ORAL_CAPSULE | ORAL | Status: DC
Start: 2018-12-19 — End: 2018-12-19

## 2018-12-19 MED ORDER — CHOLECALCIFEROL 25 MCG (1000 UT) PO TABS
1000.00 | ORAL_TABLET | ORAL | Status: DC
Start: 2018-12-19 — End: 2018-12-19

## 2018-12-19 MED ORDER — ASPIRIN 325 MG PO TBEC
325.00 | DELAYED_RELEASE_TABLET | ORAL | Status: DC
Start: 2018-12-19 — End: 2018-12-19

## 2018-12-20 DIAGNOSIS — I69391 Dysphagia following cerebral infarction: Secondary | ICD-10-CM | POA: Diagnosis not present

## 2018-12-20 DIAGNOSIS — I10 Essential (primary) hypertension: Secondary | ICD-10-CM | POA: Diagnosis not present

## 2018-12-20 DIAGNOSIS — I6522 Occlusion and stenosis of left carotid artery: Secondary | ICD-10-CM | POA: Diagnosis not present

## 2018-12-20 DIAGNOSIS — Z79899 Other long term (current) drug therapy: Secondary | ICD-10-CM | POA: Diagnosis not present

## 2018-12-20 DIAGNOSIS — I69322 Dysarthria following cerebral infarction: Secondary | ICD-10-CM | POA: Diagnosis not present

## 2018-12-20 DIAGNOSIS — I69320 Aphasia following cerebral infarction: Secondary | ICD-10-CM | POA: Diagnosis not present

## 2018-12-20 DIAGNOSIS — Z7982 Long term (current) use of aspirin: Secondary | ICD-10-CM | POA: Diagnosis not present

## 2018-12-23 ENCOUNTER — Telehealth: Payer: Self-pay | Admitting: *Deleted

## 2018-12-23 DIAGNOSIS — I69322 Dysarthria following cerebral infarction: Secondary | ICD-10-CM | POA: Diagnosis not present

## 2018-12-23 DIAGNOSIS — I69391 Dysphagia following cerebral infarction: Secondary | ICD-10-CM | POA: Diagnosis not present

## 2018-12-23 DIAGNOSIS — I6522 Occlusion and stenosis of left carotid artery: Secondary | ICD-10-CM | POA: Diagnosis not present

## 2018-12-23 DIAGNOSIS — Z7982 Long term (current) use of aspirin: Secondary | ICD-10-CM | POA: Diagnosis not present

## 2018-12-23 DIAGNOSIS — I10 Essential (primary) hypertension: Secondary | ICD-10-CM | POA: Diagnosis not present

## 2018-12-23 DIAGNOSIS — Z79899 Other long term (current) drug therapy: Secondary | ICD-10-CM | POA: Diagnosis not present

## 2018-12-23 DIAGNOSIS — I69320 Aphasia following cerebral infarction: Secondary | ICD-10-CM | POA: Diagnosis not present

## 2018-12-23 NOTE — Telephone Encounter (Signed)
Received call from [place], [place] with [facility].   Reports that ST evaluation completed on 12/23/2018. Requested VO to extend [facility] ST services 2x weekly x4 weeks for aphasia. VO given.

## 2018-12-24 DIAGNOSIS — J84112 Idiopathic pulmonary fibrosis: Secondary | ICD-10-CM | POA: Diagnosis not present

## 2018-12-24 DIAGNOSIS — J449 Chronic obstructive pulmonary disease, unspecified: Secondary | ICD-10-CM | POA: Diagnosis not present

## 2018-12-24 DIAGNOSIS — I440 Atrioventricular block, first degree: Secondary | ICD-10-CM | POA: Diagnosis not present

## 2018-12-24 DIAGNOSIS — I688 Other cerebrovascular disorders in diseases classified elsewhere: Secondary | ICD-10-CM | POA: Diagnosis not present

## 2018-12-25 ENCOUNTER — Telehealth: Payer: Self-pay | Admitting: *Deleted

## 2018-12-25 ENCOUNTER — Other Ambulatory Visit: Payer: Self-pay | Admitting: Family Medicine

## 2018-12-25 DIAGNOSIS — I69391 Dysphagia following cerebral infarction: Secondary | ICD-10-CM | POA: Diagnosis not present

## 2018-12-25 DIAGNOSIS — I6522 Occlusion and stenosis of left carotid artery: Secondary | ICD-10-CM | POA: Diagnosis not present

## 2018-12-25 DIAGNOSIS — I69320 Aphasia following cerebral infarction: Secondary | ICD-10-CM | POA: Diagnosis not present

## 2018-12-25 DIAGNOSIS — Z7982 Long term (current) use of aspirin: Secondary | ICD-10-CM | POA: Diagnosis not present

## 2018-12-25 DIAGNOSIS — Z79899 Other long term (current) drug therapy: Secondary | ICD-10-CM | POA: Diagnosis not present

## 2018-12-25 DIAGNOSIS — I10 Essential (primary) hypertension: Secondary | ICD-10-CM | POA: Diagnosis not present

## 2018-12-25 DIAGNOSIS — I69322 Dysarthria following cerebral infarction: Secondary | ICD-10-CM | POA: Diagnosis not present

## 2018-12-25 DIAGNOSIS — I63412 Cerebral infarction due to embolism of left middle cerebral artery: Secondary | ICD-10-CM | POA: Diagnosis not present

## 2018-12-25 NOTE — Telephone Encounter (Signed)
Received call from [place], [place] with [facility].   Requested orders for OT 2x weekly x3 weeks for upper extremity strengthening and increasing independent bathing and dressing.   VO given.

## 2018-12-26 DIAGNOSIS — I69322 Dysarthria following cerebral infarction: Secondary | ICD-10-CM | POA: Diagnosis not present

## 2018-12-26 DIAGNOSIS — I6522 Occlusion and stenosis of left carotid artery: Secondary | ICD-10-CM | POA: Diagnosis not present

## 2018-12-26 DIAGNOSIS — I10 Essential (primary) hypertension: Secondary | ICD-10-CM | POA: Diagnosis not present

## 2018-12-26 DIAGNOSIS — Z7982 Long term (current) use of aspirin: Secondary | ICD-10-CM | POA: Diagnosis not present

## 2018-12-26 DIAGNOSIS — I69320 Aphasia following cerebral infarction: Secondary | ICD-10-CM | POA: Diagnosis not present

## 2018-12-26 DIAGNOSIS — Z79899 Other long term (current) drug therapy: Secondary | ICD-10-CM | POA: Diagnosis not present

## 2018-12-26 DIAGNOSIS — I69391 Dysphagia following cerebral infarction: Secondary | ICD-10-CM | POA: Diagnosis not present

## 2018-12-30 ENCOUNTER — Encounter: Payer: Self-pay | Admitting: Family Medicine

## 2018-12-30 ENCOUNTER — Ambulatory Visit (INDEPENDENT_AMBULATORY_CARE_PROVIDER_SITE_OTHER): Payer: Medicare Other | Admitting: Family Medicine

## 2018-12-30 ENCOUNTER — Other Ambulatory Visit: Payer: Self-pay

## 2018-12-30 VITALS — BP 148/88 | HR 92 | Temp 97.9°F | Resp 16

## 2018-12-30 DIAGNOSIS — I10 Essential (primary) hypertension: Secondary | ICD-10-CM | POA: Diagnosis not present

## 2018-12-30 DIAGNOSIS — K5900 Constipation, unspecified: Secondary | ICD-10-CM | POA: Diagnosis not present

## 2018-12-30 DIAGNOSIS — F321 Major depressive disorder, single episode, moderate: Secondary | ICD-10-CM | POA: Diagnosis not present

## 2018-12-30 DIAGNOSIS — Z8673 Personal history of transient ischemic attack (TIA), and cerebral infarction without residual deficits: Secondary | ICD-10-CM

## 2018-12-30 DIAGNOSIS — I6523 Occlusion and stenosis of bilateral carotid arteries: Secondary | ICD-10-CM | POA: Diagnosis not present

## 2018-12-30 NOTE — Progress Notes (Signed)
Subjective:    Patient ID: [name], male    DOB: 1934/07/23, 84 y.o.   MRN: [id number]  Patient presents for Hospital F/U (CVA)  Patient here for hospital follow-up.  He was admitted to [facility] secondary to stroke Thought that it occurred in the middle of the night.  He was originally evaluated at [facility] on 12/ 9 Is currently being followed by Dr. Cheri Rous neurology at [facility] He had a few medication changes.  He was started on B12 and B1 vitamins his Celexa was discontinued and he was started on Paxil 40 mg last Thursday.  There was no change to his blood pressure medication he was maintained on the amlodipine and the lisinopril 30 mg.  He was already on statin drug Lipitor.  He was referred to cardiology he currently has a 30-day event monitor on his they are evaluating him for possible atrial fibrillation leading to the stroke this is also with [facility] health cardiology.  He already had chronic weakness gait instability was for the most part wheelchair-bound with physical therapy occupational therapy and speech therapy are working with him 2 times a week.  Today he seemed more fatigued and tired than usual but he is following commands.  He has not eaten very much this morning but states that he is currently hungry now.  He has not had any recent falls denies any chest pain denies any pain anywhere else in the body.   He is here today with his aide,  they are not sure if he has had any recent labs done since the stroke.   Review Of Systems:  GEN- +fatigue, fever, weight loss,weakness, recent illness HEENT- denies eye drainage, change in vision, nasal discharge, CVS- denies chest pain, palpitations RESP- denies SOB, cough, wheeze ABD- denies N/V, change in stools, abd pain GU- denies dysuria, hematuria, dribbling, incontinence MSK- denies joint pain, muscle aches, injury Neuro- denies headache, dizziness, syncope, seizure activity       Objective:    BP  (!) 148/88   Pulse 92   Temp 97.9 F (36.6 C) (Temporal)   Resp 16   SpO2 100%  GEN- NAD, alert and oriented x3, sitting in wheelchair HEENT- PERRL, EOMI, non injected sclera, pink conjunctiva, MMM, oropharynx clear Neck- Supple, no thyromegaly CVS- RRR, no murmur RESP-CTAB ABD-NABS,soft,NT,ND MSK-Fair ROm bilat Upper ext, ,decreased ROM Lower ext, able to extend both legs  EXT- No edema Pulses- Radial, 2+        Assessment & Plan:      Problem List Items Addressed This Visit      Unprioritized   Carotid artery disease ([place])   Relevant Orders   Lipid Panel   Constipation    Is been having some difficulty with his bowels he has not been taking a second dose of Amitiza as he has had difficulty getting up in the middle of the night.  He is using depends.  His aide will try to get him his second dose earlier in the evening instead of at bedtime.      Depression, major, single episode, moderate ([place])    He was switched over to Paxil 40 mg by neurology.  Monitor his mood.  See how he does.  No change to his lorazepam and trazodone.      Essential hypertension - Primary    Pressure looks fair today status post stroke will allow a little bit of permissive hypertension.  Continue lisinopril 30 mg and the amlodipine  blood pressure at home has looked very good per the aide as well as physical therapy.  He was following commands today did seem a bit fatigued.  I Minna check his labs to make sure there is no acute on chronic renal failure or anemia or other metabolic disturbance.  Advised him to continue to monitor his symptoms if he develops any new or worsening stroke symptoms get him back to the emergency room.      Relevant Orders   CBC with Differential   Comprehensive metabolic panel   Lipid Panel   History of stroke    Currently on aspirin 325 mg.  Following with neurology continue with physical therapy occupational therapy speech therapy.      Relevant Orders   Lipid  Panel      Note: This dictation was prepared with Dragon dictation along with smaller phrase technology. Any transcriptional errors that result from this process are unintentional.

## 2018-12-30 NOTE — Patient Instructions (Addendum)
Take the amitiza twice a day  F/U 3 MONTHS

## 2018-12-30 NOTE — Assessment & Plan Note (Signed)
Is been having some difficulty with his bowels he has not been taking a second dose of Amitiza as he has had difficulty getting up in the middle of the night.  He is using depends.  His aide will try to get him his second dose earlier in the evening instead of at bedtime.

## 2018-12-30 NOTE — Assessment & Plan Note (Signed)
Pressure looks fair today status post stroke will allow a little bit of permissive hypertension.  Continue lisinopril 30 mg and the amlodipine blood pressure at home has looked very good per the aide as well as physical therapy.  He was following commands today did seem a bit fatigued.  I Minna check his labs to make sure there is no acute on chronic renal failure or anemia or other metabolic disturbance.  Advised him to continue to monitor his symptoms if he develops any new or worsening stroke symptoms get him back to the emergency room.

## 2018-12-30 NOTE — Assessment & Plan Note (Signed)
Currently on aspirin 325 mg.  Following with neurology continue with physical therapy occupational therapy speech therapy.

## 2018-12-30 NOTE — Assessment & Plan Note (Signed)
He was switched over to Paxil 40 mg by neurology.  Monitor his mood.  See how he does.  No change to his lorazepam and trazodone.

## 2018-12-31 DIAGNOSIS — Z7982 Long term (current) use of aspirin: Secondary | ICD-10-CM | POA: Diagnosis not present

## 2018-12-31 DIAGNOSIS — I69320 Aphasia following cerebral infarction: Secondary | ICD-10-CM | POA: Diagnosis not present

## 2018-12-31 DIAGNOSIS — I6522 Occlusion and stenosis of left carotid artery: Secondary | ICD-10-CM | POA: Diagnosis not present

## 2018-12-31 DIAGNOSIS — I69322 Dysarthria following cerebral infarction: Secondary | ICD-10-CM | POA: Diagnosis not present

## 2018-12-31 DIAGNOSIS — Z79899 Other long term (current) drug therapy: Secondary | ICD-10-CM | POA: Diagnosis not present

## 2018-12-31 DIAGNOSIS — I10 Essential (primary) hypertension: Secondary | ICD-10-CM | POA: Diagnosis not present

## 2018-12-31 DIAGNOSIS — I69391 Dysphagia following cerebral infarction: Secondary | ICD-10-CM | POA: Diagnosis not present

## 2018-12-31 LAB — COMPREHENSIVE METABOLIC PANEL
AG Ratio: 1.2 (calc) (ref 1.0–2.5)
ALT: 7 U/L — ABNORMAL LOW (ref 9–46)
AST: 14 U/L (ref 10–35)
Albumin: 4 g/dL (ref 3.6–5.1)
Alkaline phosphatase (APISO): 123 U/L (ref 35–144)
BUN: 21 mg/dL (ref 7–25)
CO2: 24 mmol/L (ref 20–32)
Calcium: 9.4 mg/dL (ref 8.6–10.3)
Chloride: 109 mmol/L (ref 98–110)
Creat: 0.98 mg/dL (ref 0.70–1.11)
Globulin: 3.4 g/dL (calc) (ref 1.9–3.7)
Glucose, Bld: 101 mg/dL — ABNORMAL HIGH (ref 65–99)
Potassium: 4.5 mmol/L (ref 3.5–5.3)
Sodium: 145 mmol/L (ref 135–146)
Total Bilirubin: 0.4 mg/dL (ref 0.2–1.2)
Total Protein: 7.4 g/dL (ref 6.1–8.1)

## 2018-12-31 LAB — LIPID PANEL
Cholesterol: 98 mg/dL (ref <200)
HDL: 44 mg/dL (ref >=40)
LDL Cholesterol (Calc): 39 mg/dL (calc)
Non-HDL Cholesterol (Calc): 54 mg/dL (calc) (ref <130)
Total CHOL/HDL Ratio: 2.2 (calc) (ref <5.0)
Triglycerides: 66 mg/dL (ref <150)

## 2018-12-31 LAB — CBC WITH DIFFERENTIAL/PLATELET
Absolute Monocytes: 583 cells/uL (ref 200–950)
Basophils Absolute: 70 cells/uL (ref 0–200)
Basophils Relative: 0.8 %
Eosinophils Absolute: 339 cells/uL (ref 15–500)
Eosinophils Relative: 3.9 %
HCT: 43.7 % (ref 38.5–50.0)
Hemoglobin: 14.7 g/dL (ref 13.2–17.1)
Lymphs Abs: 1940 cells/uL (ref 850–3900)
MCH: 30.6 pg (ref 27.0–33.0)
MCHC: 33.6 g/dL (ref 32.0–36.0)
MCV: 91 fL (ref 80.0–100.0)
MPV: 9.8 fL (ref 7.5–12.5)
Monocytes Relative: 6.7 %
Neutro Abs: 5768 cells/uL (ref 1500–7800)
Neutrophils Relative %: 66.3 %
Platelets: 230 10*3/uL (ref 140–400)
RBC: 4.8 10*6/uL (ref 4.20–5.80)
RDW: 12.5 % (ref 11.0–15.0)
Total Lymphocyte: 22.3 %
WBC: 8.7 10*3/uL (ref 3.8–10.8)

## 2019-01-01 DIAGNOSIS — Z79899 Other long term (current) drug therapy: Secondary | ICD-10-CM | POA: Diagnosis not present

## 2019-01-01 DIAGNOSIS — Z7982 Long term (current) use of aspirin: Secondary | ICD-10-CM | POA: Diagnosis not present

## 2019-01-01 DIAGNOSIS — I6522 Occlusion and stenosis of left carotid artery: Secondary | ICD-10-CM | POA: Diagnosis not present

## 2019-01-01 DIAGNOSIS — I10 Essential (primary) hypertension: Secondary | ICD-10-CM | POA: Diagnosis not present

## 2019-01-01 DIAGNOSIS — I69320 Aphasia following cerebral infarction: Secondary | ICD-10-CM | POA: Diagnosis not present

## 2019-01-01 DIAGNOSIS — I69391 Dysphagia following cerebral infarction: Secondary | ICD-10-CM | POA: Diagnosis not present

## 2019-01-01 DIAGNOSIS — I69322 Dysarthria following cerebral infarction: Secondary | ICD-10-CM | POA: Diagnosis not present

## 2019-01-02 ENCOUNTER — Encounter: Payer: Self-pay | Admitting: *Deleted

## 2019-01-06 DIAGNOSIS — I69391 Dysphagia following cerebral infarction: Secondary | ICD-10-CM | POA: Diagnosis not present

## 2019-01-06 DIAGNOSIS — I69322 Dysarthria following cerebral infarction: Secondary | ICD-10-CM | POA: Diagnosis not present

## 2019-01-06 DIAGNOSIS — Z79899 Other long term (current) drug therapy: Secondary | ICD-10-CM | POA: Diagnosis not present

## 2019-01-06 DIAGNOSIS — I10 Essential (primary) hypertension: Secondary | ICD-10-CM | POA: Diagnosis not present

## 2019-01-06 DIAGNOSIS — I69320 Aphasia following cerebral infarction: Secondary | ICD-10-CM | POA: Diagnosis not present

## 2019-01-06 DIAGNOSIS — I6522 Occlusion and stenosis of left carotid artery: Secondary | ICD-10-CM | POA: Diagnosis not present

## 2019-01-06 DIAGNOSIS — Z7982 Long term (current) use of aspirin: Secondary | ICD-10-CM | POA: Diagnosis not present

## 2019-01-07 DIAGNOSIS — Z7982 Long term (current) use of aspirin: Secondary | ICD-10-CM | POA: Diagnosis not present

## 2019-01-07 DIAGNOSIS — I69322 Dysarthria following cerebral infarction: Secondary | ICD-10-CM | POA: Diagnosis not present

## 2019-01-07 DIAGNOSIS — Z79899 Other long term (current) drug therapy: Secondary | ICD-10-CM | POA: Diagnosis not present

## 2019-01-07 DIAGNOSIS — I69320 Aphasia following cerebral infarction: Secondary | ICD-10-CM | POA: Diagnosis not present

## 2019-01-07 DIAGNOSIS — I10 Essential (primary) hypertension: Secondary | ICD-10-CM | POA: Diagnosis not present

## 2019-01-07 DIAGNOSIS — I69391 Dysphagia following cerebral infarction: Secondary | ICD-10-CM | POA: Diagnosis not present

## 2019-01-07 DIAGNOSIS — I6522 Occlusion and stenosis of left carotid artery: Secondary | ICD-10-CM | POA: Diagnosis not present

## 2019-01-08 DIAGNOSIS — I69391 Dysphagia following cerebral infarction: Secondary | ICD-10-CM | POA: Diagnosis not present

## 2019-01-08 DIAGNOSIS — I6522 Occlusion and stenosis of left carotid artery: Secondary | ICD-10-CM | POA: Diagnosis not present

## 2019-01-08 DIAGNOSIS — I10 Essential (primary) hypertension: Secondary | ICD-10-CM | POA: Diagnosis not present

## 2019-01-08 DIAGNOSIS — Z79899 Other long term (current) drug therapy: Secondary | ICD-10-CM | POA: Diagnosis not present

## 2019-01-08 DIAGNOSIS — I69320 Aphasia following cerebral infarction: Secondary | ICD-10-CM | POA: Diagnosis not present

## 2019-01-08 DIAGNOSIS — I69322 Dysarthria following cerebral infarction: Secondary | ICD-10-CM | POA: Diagnosis not present

## 2019-01-08 DIAGNOSIS — Z7982 Long term (current) use of aspirin: Secondary | ICD-10-CM | POA: Diagnosis not present

## 2019-01-09 DIAGNOSIS — I69391 Dysphagia following cerebral infarction: Secondary | ICD-10-CM | POA: Diagnosis not present

## 2019-01-09 DIAGNOSIS — I69320 Aphasia following cerebral infarction: Secondary | ICD-10-CM | POA: Diagnosis not present

## 2019-01-09 DIAGNOSIS — I69322 Dysarthria following cerebral infarction: Secondary | ICD-10-CM | POA: Diagnosis not present

## 2019-01-09 DIAGNOSIS — Z79899 Other long term (current) drug therapy: Secondary | ICD-10-CM | POA: Diagnosis not present

## 2019-01-09 DIAGNOSIS — I6522 Occlusion and stenosis of left carotid artery: Secondary | ICD-10-CM | POA: Diagnosis not present

## 2019-01-09 DIAGNOSIS — Z7982 Long term (current) use of aspirin: Secondary | ICD-10-CM | POA: Diagnosis not present

## 2019-01-09 DIAGNOSIS — I10 Essential (primary) hypertension: Secondary | ICD-10-CM | POA: Diagnosis not present

## 2019-01-10 DIAGNOSIS — I6522 Occlusion and stenosis of left carotid artery: Secondary | ICD-10-CM | POA: Diagnosis not present

## 2019-01-10 DIAGNOSIS — I10 Essential (primary) hypertension: Secondary | ICD-10-CM | POA: Diagnosis not present

## 2019-01-10 DIAGNOSIS — Z79899 Other long term (current) drug therapy: Secondary | ICD-10-CM | POA: Diagnosis not present

## 2019-01-10 DIAGNOSIS — I69391 Dysphagia following cerebral infarction: Secondary | ICD-10-CM | POA: Diagnosis not present

## 2019-01-10 DIAGNOSIS — Z7982 Long term (current) use of aspirin: Secondary | ICD-10-CM | POA: Diagnosis not present

## 2019-01-10 DIAGNOSIS — I69322 Dysarthria following cerebral infarction: Secondary | ICD-10-CM | POA: Diagnosis not present

## 2019-01-10 DIAGNOSIS — I69320 Aphasia following cerebral infarction: Secondary | ICD-10-CM | POA: Diagnosis not present

## 2019-01-13 NOTE — Addendum Note (Signed)
Addended by: Vic Blackbird F on: 01/13/2019 01:25 PM   Modules accepted: Orders

## 2019-01-14 DIAGNOSIS — Z79899 Other long term (current) drug therapy: Secondary | ICD-10-CM | POA: Diagnosis not present

## 2019-01-14 DIAGNOSIS — I10 Essential (primary) hypertension: Secondary | ICD-10-CM | POA: Diagnosis not present

## 2019-01-14 DIAGNOSIS — I69391 Dysphagia following cerebral infarction: Secondary | ICD-10-CM | POA: Diagnosis not present

## 2019-01-14 DIAGNOSIS — I69322 Dysarthria following cerebral infarction: Secondary | ICD-10-CM | POA: Diagnosis not present

## 2019-01-14 DIAGNOSIS — I69320 Aphasia following cerebral infarction: Secondary | ICD-10-CM | POA: Diagnosis not present

## 2019-01-14 DIAGNOSIS — Z7982 Long term (current) use of aspirin: Secondary | ICD-10-CM | POA: Diagnosis not present

## 2019-01-14 DIAGNOSIS — I6522 Occlusion and stenosis of left carotid artery: Secondary | ICD-10-CM | POA: Diagnosis not present

## 2019-01-15 DIAGNOSIS — I10 Essential (primary) hypertension: Secondary | ICD-10-CM | POA: Diagnosis not present

## 2019-01-16 DIAGNOSIS — I69322 Dysarthria following cerebral infarction: Secondary | ICD-10-CM | POA: Diagnosis not present

## 2019-01-16 DIAGNOSIS — I6522 Occlusion and stenosis of left carotid artery: Secondary | ICD-10-CM | POA: Diagnosis not present

## 2019-01-16 DIAGNOSIS — Z7982 Long term (current) use of aspirin: Secondary | ICD-10-CM | POA: Diagnosis not present

## 2019-01-16 DIAGNOSIS — I69391 Dysphagia following cerebral infarction: Secondary | ICD-10-CM | POA: Diagnosis not present

## 2019-01-16 DIAGNOSIS — I10 Essential (primary) hypertension: Secondary | ICD-10-CM | POA: Diagnosis not present

## 2019-01-16 DIAGNOSIS — Z79899 Other long term (current) drug therapy: Secondary | ICD-10-CM | POA: Diagnosis not present

## 2019-01-16 DIAGNOSIS — I69320 Aphasia following cerebral infarction: Secondary | ICD-10-CM | POA: Diagnosis not present

## 2019-01-21 ENCOUNTER — Other Ambulatory Visit: Payer: Self-pay | Admitting: Family Medicine

## 2019-01-21 DIAGNOSIS — Z7982 Long term (current) use of aspirin: Secondary | ICD-10-CM | POA: Diagnosis not present

## 2019-01-21 DIAGNOSIS — I69320 Aphasia following cerebral infarction: Secondary | ICD-10-CM | POA: Diagnosis not present

## 2019-01-21 DIAGNOSIS — I69391 Dysphagia following cerebral infarction: Secondary | ICD-10-CM | POA: Diagnosis not present

## 2019-01-21 DIAGNOSIS — Z79899 Other long term (current) drug therapy: Secondary | ICD-10-CM | POA: Diagnosis not present

## 2019-01-21 DIAGNOSIS — I6522 Occlusion and stenosis of left carotid artery: Secondary | ICD-10-CM | POA: Diagnosis not present

## 2019-01-21 DIAGNOSIS — I10 Essential (primary) hypertension: Secondary | ICD-10-CM | POA: Diagnosis not present

## 2019-01-21 DIAGNOSIS — I69322 Dysarthria following cerebral infarction: Secondary | ICD-10-CM | POA: Diagnosis not present

## 2019-01-22 DIAGNOSIS — R569 Unspecified convulsions: Secondary | ICD-10-CM | POA: Diagnosis not present

## 2019-01-22 DIAGNOSIS — R413 Other amnesia: Secondary | ICD-10-CM | POA: Diagnosis not present

## 2019-01-22 DIAGNOSIS — I779 Disorder of arteries and arterioles, unspecified: Secondary | ICD-10-CM | POA: Diagnosis not present

## 2019-01-22 DIAGNOSIS — M4802 Spinal stenosis, cervical region: Secondary | ICD-10-CM | POA: Diagnosis not present

## 2019-01-22 DIAGNOSIS — Z8673 Personal history of transient ischemic attack (TIA), and cerebral infarction without residual deficits: Secondary | ICD-10-CM | POA: Diagnosis not present

## 2019-01-23 DIAGNOSIS — I69391 Dysphagia following cerebral infarction: Secondary | ICD-10-CM | POA: Diagnosis not present

## 2019-01-23 DIAGNOSIS — Z79899 Other long term (current) drug therapy: Secondary | ICD-10-CM | POA: Diagnosis not present

## 2019-01-23 DIAGNOSIS — I69322 Dysarthria following cerebral infarction: Secondary | ICD-10-CM | POA: Diagnosis not present

## 2019-01-23 DIAGNOSIS — I10 Essential (primary) hypertension: Secondary | ICD-10-CM | POA: Diagnosis not present

## 2019-01-23 DIAGNOSIS — Z7982 Long term (current) use of aspirin: Secondary | ICD-10-CM | POA: Diagnosis not present

## 2019-01-23 DIAGNOSIS — I6522 Occlusion and stenosis of left carotid artery: Secondary | ICD-10-CM | POA: Diagnosis not present

## 2019-01-23 DIAGNOSIS — I69320 Aphasia following cerebral infarction: Secondary | ICD-10-CM | POA: Diagnosis not present

## 2019-01-24 DIAGNOSIS — J84112 Idiopathic pulmonary fibrosis: Secondary | ICD-10-CM | POA: Diagnosis not present

## 2019-01-24 DIAGNOSIS — J449 Chronic obstructive pulmonary disease, unspecified: Secondary | ICD-10-CM | POA: Diagnosis not present

## 2019-01-27 ENCOUNTER — Other Ambulatory Visit: Payer: Self-pay | Admitting: Family Medicine

## 2019-01-27 ENCOUNTER — Telehealth: Payer: Self-pay | Admitting: Family Medicine

## 2019-01-27 NOTE — Telephone Encounter (Signed)
Spoke with patient's daughter and informed her that it is ok to continue to take the omega 3.

## 2019-01-27 NOTE — Telephone Encounter (Signed)
[phone] Patient is calling with questions about her dads memory pill

## 2019-01-28 DIAGNOSIS — I69322 Dysarthria following cerebral infarction: Secondary | ICD-10-CM | POA: Diagnosis not present

## 2019-01-28 DIAGNOSIS — I69391 Dysphagia following cerebral infarction: Secondary | ICD-10-CM | POA: Diagnosis not present

## 2019-01-28 DIAGNOSIS — I10 Essential (primary) hypertension: Secondary | ICD-10-CM | POA: Diagnosis not present

## 2019-01-28 DIAGNOSIS — Z79899 Other long term (current) drug therapy: Secondary | ICD-10-CM | POA: Diagnosis not present

## 2019-01-28 DIAGNOSIS — Z7982 Long term (current) use of aspirin: Secondary | ICD-10-CM | POA: Diagnosis not present

## 2019-01-28 DIAGNOSIS — I69320 Aphasia following cerebral infarction: Secondary | ICD-10-CM | POA: Diagnosis not present

## 2019-01-28 DIAGNOSIS — I6522 Occlusion and stenosis of left carotid artery: Secondary | ICD-10-CM | POA: Diagnosis not present

## 2019-01-31 DIAGNOSIS — I69391 Dysphagia following cerebral infarction: Secondary | ICD-10-CM | POA: Diagnosis not present

## 2019-01-31 DIAGNOSIS — I6522 Occlusion and stenosis of left carotid artery: Secondary | ICD-10-CM | POA: Diagnosis not present

## 2019-01-31 DIAGNOSIS — Z79899 Other long term (current) drug therapy: Secondary | ICD-10-CM | POA: Diagnosis not present

## 2019-01-31 DIAGNOSIS — I69322 Dysarthria following cerebral infarction: Secondary | ICD-10-CM | POA: Diagnosis not present

## 2019-01-31 DIAGNOSIS — I69320 Aphasia following cerebral infarction: Secondary | ICD-10-CM | POA: Diagnosis not present

## 2019-01-31 DIAGNOSIS — I10 Essential (primary) hypertension: Secondary | ICD-10-CM | POA: Diagnosis not present

## 2019-01-31 DIAGNOSIS — Z7982 Long term (current) use of aspirin: Secondary | ICD-10-CM | POA: Diagnosis not present

## 2019-02-03 DIAGNOSIS — I10 Essential (primary) hypertension: Secondary | ICD-10-CM | POA: Diagnosis not present

## 2019-02-03 DIAGNOSIS — M4800 Spinal stenosis, site unspecified: Secondary | ICD-10-CM | POA: Diagnosis not present

## 2019-02-03 DIAGNOSIS — J449 Chronic obstructive pulmonary disease, unspecified: Secondary | ICD-10-CM | POA: Diagnosis not present

## 2019-02-04 ENCOUNTER — Telehealth: Payer: Self-pay | Admitting: Family Medicine

## 2019-02-04 NOTE — Telephone Encounter (Signed)
Call placed to patient. LMTRC.  

## 2019-02-04 NOTE — Telephone Encounter (Signed)
Would like to talk about his rash and have something called in for it, said she would discuss further when the nurse called her back

## 2019-02-05 MED ORDER — ZINC OXIDE 30.6 % EX CREA: TOPICAL_CREAM | CUTANEOUS | 3 refills | Status: DC

## 2019-02-05 NOTE — Telephone Encounter (Signed)
If that does not work, he can try Clotrimazole1% BID to affected areas for yeast infection

## 2019-02-05 NOTE — Telephone Encounter (Signed)
Noted  

## 2019-02-05 NOTE — Telephone Encounter (Signed)
Received call from patient caregiver, Charlett Nose.  Reports that patient has red irritated perineum and scrotum. States that since his last CVA, he has been having urinary incontinence and thus wearing briefs. States that if he sits in wetness for extended period of time, he has red irritation to buttocks, perineum and scrotum.   Denies any yeasty overgrowth, denies any open wounds.  Advised to make sure to toilet him frequently and change brief if wet. Advised to add zinc based barrier cream like Desitin. Requested prescription to be sent to pharmacy. Prescription sent to pharmacy.

## 2019-02-06 DIAGNOSIS — I69391 Dysphagia following cerebral infarction: Secondary | ICD-10-CM | POA: Diagnosis not present

## 2019-02-06 DIAGNOSIS — I10 Essential (primary) hypertension: Secondary | ICD-10-CM | POA: Diagnosis not present

## 2019-02-06 DIAGNOSIS — Z79899 Other long term (current) drug therapy: Secondary | ICD-10-CM | POA: Diagnosis not present

## 2019-02-06 DIAGNOSIS — I6522 Occlusion and stenosis of left carotid artery: Secondary | ICD-10-CM | POA: Diagnosis not present

## 2019-02-06 DIAGNOSIS — I69322 Dysarthria following cerebral infarction: Secondary | ICD-10-CM | POA: Diagnosis not present

## 2019-02-06 DIAGNOSIS — Z7982 Long term (current) use of aspirin: Secondary | ICD-10-CM | POA: Diagnosis not present

## 2019-02-06 DIAGNOSIS — I69320 Aphasia following cerebral infarction: Secondary | ICD-10-CM | POA: Diagnosis not present

## 2019-02-11 ENCOUNTER — Encounter: Payer: Self-pay | Admitting: Family Medicine

## 2019-02-11 ENCOUNTER — Ambulatory Visit (INDEPENDENT_AMBULATORY_CARE_PROVIDER_SITE_OTHER): Payer: Medicare Other | Admitting: Family Medicine

## 2019-02-11 ENCOUNTER — Other Ambulatory Visit: Payer: Self-pay

## 2019-02-11 VITALS — BP 148/86 | HR 90 | Temp 98.1°F | Resp 16

## 2019-02-11 DIAGNOSIS — L609 Nail disorder, unspecified: Secondary | ICD-10-CM | POA: Diagnosis not present

## 2019-02-11 DIAGNOSIS — Z993 Dependence on wheelchair: Secondary | ICD-10-CM

## 2019-02-11 DIAGNOSIS — B372 Candidiasis of skin and nail: Secondary | ICD-10-CM | POA: Diagnosis not present

## 2019-02-11 DIAGNOSIS — I10 Essential (primary) hypertension: Secondary | ICD-10-CM | POA: Diagnosis not present

## 2019-02-11 MED ORDER — MUPIROCIN CALCIUM 2 % EX CREA: 1.0000 "application " | TOPICAL_CREAM | Freq: Every day | CUTANEOUS | 0 refills | Status: DC

## 2019-02-11 MED ORDER — CLOTRIMAZOLE-BETAMETHASONE 1-0.05 % EX CREA: 1.0000 "application " | TOPICAL_CREAM | Freq: Two times a day (BID) | CUTANEOUS | 0 refills | Status: DC

## 2019-02-11 NOTE — Patient Instructions (Addendum)
Apply the Lotrisone cream twice a day Apply the bactroban once a day Change diaper often to keep dry  F/U 4 months

## 2019-02-11 NOTE — Assessment & Plan Note (Signed)
BP looks okay, home readings at goal No changes

## 2019-02-11 NOTE — Progress Notes (Addendum)
   Subjective:    Patient ID: [name], male    DOB: Apr 28, 1934, 85 y.o.   MRN: [id number]  Patient presents for Rash (scrotum/ perineal/ buttocks)   HTN- blood pressure has been good ranging in the 120s to 130s at home.  Seen by neurology recently there were no changes in his medications.  Concern today is a rash around his groin area that is spread to his inner legs and the buttocks region.  His aide initially noted after he had been sitting in a wet diaper throughout the night.  His skin was starting to breakdown.  She tried clotrimazole cream and then moved to Vaseline with minimal improvement.  She did call in last week zinc oxide ointment was sent but this did not improve it.  She noted a blister on his left inner leg yesterday.  He has been crawling at his skin states that it itches intensely.  He is still urinating normally and having normal bowel movements.  No change in his soap or detergent. Also requested that we help trim his fingernails since he has been clawing at the skin day she is unable to do it because she is an aide  Review Of Systems: per pt and aide   GEN- denies fatigue, fever, weight loss,weakness, recent illness HEENT- denies eye drainage, change in vision, nasal discharge, CVS- denies chest pain, palpitations RESP- denies SOB, cough, wheeze ABD- denies N/V, change in stools, abd pain GU- denies dysuria, hematuria, dribbling, incontinence MSK- denies joint pain, muscle aches, injury Neuro- denies headache, dizziness, syncope, seizure activity       Objective:    BP (!) 148/86   Pulse 90   Temp 98.1 F (36.7 C) (Temporal)   Resp 16   SpO2 96%  GEN- NAD, alert and oriented x3, sitting in wheelchair HEENT- PERRL, EOMI, non injected sclera, pink conjunctiva  CVS- RRR, no murmur RESP-CTAB ABD-NABS,soft,NT,ND MSK-Fair ROm bilat Upper ext, ,decreased ROM Lower ext, able to extend both legs  Skin- long fingernails with clubbing of nails, debri beneath  severe maceration with erythema in inguinal creases, extending to buttocks and gluteal cleft, erythema/ over penile shaft, no discharge from penis, no blisters seen, TTP over skin, +excoriations on thighs EXT- No edema Pulses- Radial, 2+       Assessment & Plan:      Problem List Items Addressed This Visit      Unprioritized   Essential hypertension    BP looks okay, home readings at goal No changes        Other Visit Diagnoses    Candidal intertrigo    -  Primary   Severe intertigo due to moisture from incontinence, will use lotriosne BID due to the severe itching and add bactroban daily in middle of day to cover bacterial superinfection, no other blisters seen on eam    Relevant Medications   clotrimazole-betamethasone (LOTRISONE) cream   mupirocin cream (BACTROBAN) 2 %   Fingernail problem       I clipped and filed his nails, no cuts noted in the skin, no bleeding.     Wheelchair Bound Transfer wheelchair with  drop arm needed  for ease of transfers. This will help care givers within the home and with transporting to appointments         Note: This dictation was prepared with Dragon dictation along with smaller phrase technology. Any transcriptional errors that result from this process are unintentional.

## 2019-02-12 DIAGNOSIS — I69391 Dysphagia following cerebral infarction: Secondary | ICD-10-CM | POA: Diagnosis not present

## 2019-02-12 DIAGNOSIS — Z7982 Long term (current) use of aspirin: Secondary | ICD-10-CM | POA: Diagnosis not present

## 2019-02-12 DIAGNOSIS — I69320 Aphasia following cerebral infarction: Secondary | ICD-10-CM | POA: Diagnosis not present

## 2019-02-12 DIAGNOSIS — I10 Essential (primary) hypertension: Secondary | ICD-10-CM | POA: Diagnosis not present

## 2019-02-12 DIAGNOSIS — I69322 Dysarthria following cerebral infarction: Secondary | ICD-10-CM | POA: Diagnosis not present

## 2019-02-12 DIAGNOSIS — I6522 Occlusion and stenosis of left carotid artery: Secondary | ICD-10-CM | POA: Diagnosis not present

## 2019-02-12 DIAGNOSIS — Z79899 Other long term (current) drug therapy: Secondary | ICD-10-CM | POA: Diagnosis not present

## 2019-02-13 ENCOUNTER — Telehealth: Payer: Self-pay | Admitting: *Deleted

## 2019-02-13 NOTE — Telephone Encounter (Signed)
Received call from patient aide, [name] [place].   Reports that patient has a smaller transfer wheelchair in home that is required as it has smaller wheels so it fits better in bathroom. Reports that current transfer wheelchair is >83 years old, and arm rests have lost the cushion.   Requested order to be faxed to [facility]

## 2019-02-14 NOTE — Telephone Encounter (Signed)
Orders signed.

## 2019-02-17 ENCOUNTER — Other Ambulatory Visit: Payer: Self-pay | Admitting: Family Medicine

## 2019-02-18 ENCOUNTER — Telehealth: Payer: Self-pay | Admitting: *Deleted

## 2019-02-18 NOTE — Telephone Encounter (Signed)
Note addendum done.

## 2019-02-18 NOTE — Assessment & Plan Note (Signed)
Transfer wheelchair with  drop arm needed  for ease of transfers. This will help care givers within the home and with transporting to appointments

## 2019-02-18 NOTE — Telephone Encounter (Signed)
Received call from [place]ille [place], [place] with [facility].   Reports that order has been received for transfer wheelchair, but she requires face to face notes from within the last 6 months describing patient need for new wheelchair. States that notes must specifically state that patient requires WC with drop arm for ease of transfers.   Advised that recent note can be addended if MD prefers.

## 2019-02-18 NOTE — Telephone Encounter (Signed)
Notes sent to DME.

## 2019-02-19 ENCOUNTER — Other Ambulatory Visit: Payer: Self-pay | Admitting: Family Medicine

## 2019-02-24 DIAGNOSIS — J84112 Idiopathic pulmonary fibrosis: Secondary | ICD-10-CM | POA: Diagnosis not present

## 2019-02-24 DIAGNOSIS — J449 Chronic obstructive pulmonary disease, unspecified: Secondary | ICD-10-CM | POA: Diagnosis not present

## 2019-02-25 DIAGNOSIS — I739 Peripheral vascular disease, unspecified: Secondary | ICD-10-CM | POA: Diagnosis not present

## 2019-02-25 DIAGNOSIS — L110 Acquired keratosis follicularis: Secondary | ICD-10-CM | POA: Diagnosis not present

## 2019-02-25 DIAGNOSIS — B351 Tinea unguium: Secondary | ICD-10-CM | POA: Diagnosis not present

## 2019-03-03 ENCOUNTER — Other Ambulatory Visit: Payer: Self-pay | Admitting: *Deleted

## 2019-03-03 DIAGNOSIS — J449 Chronic obstructive pulmonary disease, unspecified: Secondary | ICD-10-CM | POA: Diagnosis not present

## 2019-03-03 DIAGNOSIS — I10 Essential (primary) hypertension: Secondary | ICD-10-CM | POA: Diagnosis not present

## 2019-03-03 MED ORDER — CLOTRIMAZOLE-BETAMETHASONE 1-0.05 % EX CREA: 1.0000 "application " | TOPICAL_CREAM | Freq: Two times a day (BID) | CUTANEOUS | 0 refills | Status: DC

## 2019-03-03 MED ORDER — CLOTRIMAZOLE-BETAMETHASONE 1-0.05 % EX CREA: 1.0000 "application " | TOPICAL_CREAM | Freq: Two times a day (BID) | CUTANEOUS | 3 refills | Status: DC

## 2019-03-20 ENCOUNTER — Other Ambulatory Visit: Payer: Self-pay | Admitting: Family Medicine

## 2019-03-20 NOTE — Telephone Encounter (Signed)
Requesting refill    Lorazepam  LOV: 02/11/2019   LRF: 11/22/2018

## 2019-03-21 NOTE — Telephone Encounter (Signed)
Please give him more refills on meds They are pill packed

## 2019-03-24 DIAGNOSIS — J84112 Idiopathic pulmonary fibrosis: Secondary | ICD-10-CM | POA: Diagnosis not present

## 2019-03-24 DIAGNOSIS — J449 Chronic obstructive pulmonary disease, unspecified: Secondary | ICD-10-CM | POA: Diagnosis not present

## 2019-04-03 DIAGNOSIS — J449 Chronic obstructive pulmonary disease, unspecified: Secondary | ICD-10-CM | POA: Diagnosis not present

## 2019-04-03 DIAGNOSIS — I10 Essential (primary) hypertension: Secondary | ICD-10-CM | POA: Diagnosis not present

## 2019-04-08 ENCOUNTER — Other Ambulatory Visit: Payer: Self-pay

## 2019-04-08 ENCOUNTER — Ambulatory Visit (INDEPENDENT_AMBULATORY_CARE_PROVIDER_SITE_OTHER): Payer: Medicare Other | Admitting: Family Medicine

## 2019-04-08 ENCOUNTER — Encounter: Payer: Self-pay | Admitting: Family Medicine

## 2019-04-08 VITALS — BP 144/80 | HR 100 | Temp 98.3°F | Resp 18

## 2019-04-08 DIAGNOSIS — J84112 Idiopathic pulmonary fibrosis: Secondary | ICD-10-CM

## 2019-04-08 DIAGNOSIS — J22 Unspecified acute lower respiratory infection: Secondary | ICD-10-CM

## 2019-04-08 MED ORDER — AZITHROMYCIN 250 MG PO TABS: ORAL_TABLET | ORAL | 0 refills | Status: DC

## 2019-04-08 MED ORDER — PREDNISONE 10 MG PO TABS: ORAL_TABLET | ORAL | 0 refills | Status: DC

## 2019-04-08 NOTE — Progress Notes (Signed)
   Subjective:    Patient ID: [name], male    DOB: 1934/01/28, 85 y.o.   MRN: [id number]  Patient presents for Illness (x1 week- wheezing, fatigue, lethargy, productive cough with white colored mucus) Patient here with his aide.  For the past week he has had cough with congestion fatigue body aches wheezing episodes.  He does have underlying interstitial lung disease/bronchitis.  Also history of pneumonia.  He had a stroke a few months ago.  He has not had any fever.  His aide has been giving him his nebulizer as needed last given yesterday.  They bought also using Mucinex DM which is helped break up some of the mucus.  No known sick contacts.  His sister whom he lives with is not been sick.  No known COVID-19 contacts.  There has been no change in his bowel or bladder.  His appetite is fair  He is still using oxygen 2 L at bedtime  Review Of Systems:  GEN- denies fatigue, fever, weight loss,weakness, recent illness HEENT- denies eye drainage, change in vision, nasal discharge, CVS- denies chest pain, palpitations RESP- denies SOB, cough, wheeze ABD- denies N/V, change in stools, abd pain GU- denies dysuria, hematuria, dribbling, incontinence MSK- denies joint pain, muscle aches, injury Neuro- denies headache, dizziness, syncope, seizure activity       Objective:    BP (!) 144/80   Pulse 100   Temp 98.3 F (36.8 C) (Temporal)   Resp 18   SpO2 96%  GEN- NAD, alert and oriented x3, nontoxic-appearing HEENT- PERRL, EOMI, non injected sclera, pink conjunctiva, MMM, oropharynx clear TM clear bilat no effusion,   no maxillary sinus tenderness, Neck- Supple, no LAD CVS- RRR, no murmur RESP-mild rhonchi no specific rales no bronchospasm noted normal work of breathing sitting in his wheelchair no retractions EXT- No edema Pulses- Radial 2+         Assessment & Plan:      Problem List Items Addressed This Visit      Unprioritized   IPF (idiopathic pulmonary fibrosis) (HCC)    Relevant Medications   azithromycin (ZITHROMAX) 250 MG tablet    Other Visit Diagnoses    Acute respiratory infection    -  Primary   For respiratory infection with his age and comorbidities he also has pulmonary fibrosis.  He did receive 1 COVID-19 vaccine about a week and a half ago however he is not fully vaccinated.  I Georgina Peer go ahead and swab him for COVID-19 in the office.  We will prophylactically start him on azithromycin as well as prednisone for his symptoms.  Continue oxygen therapy as well as Mucinex DM or Robitussin-DM.  Aide is aware of red flags of when to send him to the emergency room.  O2 sats were stable in the office as well as his blood pressure   Relevant Medications   azithromycin (ZITHROMAX) 250 MG tablet   Other Relevant Orders   SARS-COV-2 RNA,(COVID-19) QUAL NAAT      Note: This dictation was prepared with Dragon dictation along with smaller phrase technology. Any transcriptional errors that result from this process are unintentional.

## 2019-04-08 NOTE — Patient Instructions (Addendum)
F/U as previous Take steroids,antibiotics, continue cough med and nebs

## 2019-04-09 LAB — SARS-COV-2 RNA,(COVID-19) QUALITATIVE NAAT: SARS CoV2 RNA: NOT DETECTED

## 2019-04-17 ENCOUNTER — Other Ambulatory Visit: Payer: Self-pay | Admitting: Family Medicine

## 2019-04-18 ENCOUNTER — Telehealth: Payer: Self-pay | Admitting: *Deleted

## 2019-04-18 NOTE — Telephone Encounter (Signed)
Received VM from patient caregiver, [name] Scales [phone]~ telephone.   Inquired about appetite stimulant for patient.   Call placed to caregiver to inquire. [place].

## 2019-04-18 NOTE — Telephone Encounter (Signed)
Call placed to patient and patient caregiver [name] made aware.

## 2019-04-18 NOTE — Telephone Encounter (Signed)
Received return call from caregiver, [name].   Reports that patient has had much better appetite while on prednisone. Of note, she spaced out prednisone so that patient could obtain the Moderna vaccine #2. Patient completed taper today.   Inquired as to if there is a medication patient could take with noon meal to increase appetite so that he could at least eat a good dinner.   MD please advise.

## 2019-04-18 NOTE — Telephone Encounter (Signed)
Unfortunately nothing else to prescribe him for appetite He was on remeron in the past but had reaction with other medshad to come off We discussed boost/ensure- they can try giving him 1/2 can since it makes him to go the bathroom or give him carnation instant breakfast

## 2019-04-24 DIAGNOSIS — J84112 Idiopathic pulmonary fibrosis: Secondary | ICD-10-CM | POA: Diagnosis not present

## 2019-04-24 DIAGNOSIS — L110 Acquired keratosis follicularis: Secondary | ICD-10-CM | POA: Diagnosis not present

## 2019-04-24 DIAGNOSIS — Q828 Other specified congenital malformations of skin: Secondary | ICD-10-CM | POA: Diagnosis not present

## 2019-04-24 DIAGNOSIS — M79671 Pain in right foot: Secondary | ICD-10-CM | POA: Diagnosis not present

## 2019-04-24 DIAGNOSIS — J449 Chronic obstructive pulmonary disease, unspecified: Secondary | ICD-10-CM | POA: Diagnosis not present

## 2019-05-12 DIAGNOSIS — J449 Chronic obstructive pulmonary disease, unspecified: Secondary | ICD-10-CM | POA: Diagnosis not present

## 2019-05-12 DIAGNOSIS — I10 Essential (primary) hypertension: Secondary | ICD-10-CM | POA: Diagnosis not present

## 2019-05-14 ENCOUNTER — Encounter: Payer: Self-pay | Admitting: Family Medicine

## 2019-05-14 ENCOUNTER — Other Ambulatory Visit: Payer: Self-pay

## 2019-05-14 ENCOUNTER — Ambulatory Visit (INDEPENDENT_AMBULATORY_CARE_PROVIDER_SITE_OTHER): Payer: Medicare Other | Admitting: Family Medicine

## 2019-05-14 VITALS — BP 142/86 | HR 98 | Temp 98.1°F | Resp 16

## 2019-05-14 DIAGNOSIS — L89152 Pressure ulcer of sacral region, stage 2: Secondary | ICD-10-CM | POA: Diagnosis not present

## 2019-05-14 NOTE — Patient Instructions (Addendum)
Change Duoderm every 2-3 days Keep area clean  Take tylenol as prescribed  PLEASE MAKE SURE HIS NEXT APPT IS A PHYSICAL - NOT JUST REGULAR FOLLOW UP

## 2019-05-14 NOTE — Progress Notes (Signed)
   Subjective:    Patient ID: [name], male    DOB: Dec 17, 1934, 85 y.o.   MRN: [id number]  Patient presents for Lower Back/ Buttock Pain (x2 weeks- pain in upper buttock/ lower back)  Patient here with sacral buttocks pain for the past 2 weeks. He was pointing to his lower back to his gait but once we start the examination he actually pointed to his buttocks region. He has not had any falls or injuries. No new pain radiating down the legs no tingling or numbness that is new in the lower extremities. He sits all day as he is wheelchair-bound and has discomfort in his bottom. No change in his bowel movements or urinary pattern no fever or other signs of illness. Taking all his medications as prescribed  Review Of Systems:  GEN- denies fatigue, fever, weight loss,weakness, recent illness HEENT- denies eye drainage, change in vision, nasal discharge, CVS- denies chest pain, palpitations RESP- denies SOB, cough, wheeze ABD- denies N/V, change in stools, abd pain GU- denies dysuria, hematuria, dribbling, incontinence MSK-+joint pain, muscle aches, injury Neuro- denies headache, dizziness, syncope, seizure activity       Objective:    BP (!) 142/86   Pulse 98   Temp 98.1 F (36.7 C) (Temporal)   Resp 16   SpO2 99%  GEN- NAD, alert and oriented x3 HEENT- PERRL, EOMI, non injected sclera, pink conjunctiva, MMM, oropharynx clear CVS- RRR, no murmur RESP-CTAB ABD-NABS,soft,NT,ND Skin sacral region early stage II ulceration 1-2 inches long area  noted no drainage or foul odor tenderness to palpation Also skeletal lumbar spine nontender decreased range of motion EXT- No edema Pulses- Radial,2+        Assessment & Plan:      Problem List Items Addressed This Visit    None    Visit Diagnoses    Decubitus ulcer of sacral region, stage 2 (HCC)    -  Primary   Stage II ulcer that is not infected seems to be early DuoDERM applied discussed care with his Aide, change duoderm every 2  days, they can continue his skin protectant creams for sacrum       Note: This dictation was prepared with Dragon dictation along with smaller phrase technology. Any transcriptional errors that result from this process are unintentional.

## 2019-05-15 ENCOUNTER — Encounter: Payer: Self-pay | Admitting: Family Medicine

## 2019-05-19 DIAGNOSIS — I739 Peripheral vascular disease, unspecified: Secondary | ICD-10-CM | POA: Diagnosis not present

## 2019-05-19 DIAGNOSIS — L110 Acquired keratosis follicularis: Secondary | ICD-10-CM | POA: Diagnosis not present

## 2019-05-19 DIAGNOSIS — B351 Tinea unguium: Secondary | ICD-10-CM | POA: Diagnosis not present

## 2019-05-20 ENCOUNTER — Other Ambulatory Visit: Payer: Self-pay | Admitting: Family Medicine

## 2019-05-24 DIAGNOSIS — J84112 Idiopathic pulmonary fibrosis: Secondary | ICD-10-CM | POA: Diagnosis not present

## 2019-05-24 DIAGNOSIS — J449 Chronic obstructive pulmonary disease, unspecified: Secondary | ICD-10-CM | POA: Diagnosis not present

## 2019-05-26 ENCOUNTER — Ambulatory Visit: Payer: Medicare Other | Admitting: Internal Medicine

## 2019-05-30 ENCOUNTER — Other Ambulatory Visit: Payer: Self-pay | Admitting: Family Medicine

## 2019-06-04 ENCOUNTER — Ambulatory Visit: Payer: Medicare Other | Admitting: Internal Medicine

## 2019-06-13 ENCOUNTER — Ambulatory Visit: Payer: Medicare Other | Admitting: Family Medicine

## 2019-06-17 ENCOUNTER — Ambulatory Visit: Payer: Medicare Other | Admitting: Family Medicine

## 2019-06-24 ENCOUNTER — Other Ambulatory Visit: Payer: Self-pay | Admitting: Family Medicine

## 2019-06-24 DIAGNOSIS — J84112 Idiopathic pulmonary fibrosis: Secondary | ICD-10-CM | POA: Diagnosis not present

## 2019-06-24 DIAGNOSIS — J449 Chronic obstructive pulmonary disease, unspecified: Secondary | ICD-10-CM | POA: Diagnosis not present

## 2019-06-27 ENCOUNTER — Telehealth: Payer: Self-pay | Admitting: Family Medicine

## 2019-06-27 NOTE — Progress Notes (Signed)
  Chronic Care Management   Note  06/27/2019 Name: [name] MRN: [id number] DOB: [birth year]  [name] is a 85 y.o. year old male who is a primary care patient of [place], Modena Nunnery, MD. I reached out to [name] by phone today in response to a referral sent by [name]'s PCP, Buelah Manis, Modena Nunnery, MD.   [name] was given information about Chronic Care Management services today including:  1. CCM service includes personalized support from designated clinical staff supervised by his physician, including individualized plan of care and coordination with other care providers 2. 24/7 contact phone numbers for assistance for urgent and routine care needs. 3. Service will only be billed when office clinical staff spend 20 minutes or more in a month to coordinate care. 4. Only one practitioner may furnish and bill the service in a calendar month. 5. The patient may stop CCM services at any time (effective at the end of the month) by phone call to the office staff.   Patient wishes to consider information provided and/or speak with a member of the care team before deciding about enrollment in care management services.   Follow up plan:   [place]

## 2019-07-09 ENCOUNTER — Other Ambulatory Visit: Payer: Self-pay | Admitting: *Deleted

## 2019-07-09 DIAGNOSIS — J449 Chronic obstructive pulmonary disease, unspecified: Secondary | ICD-10-CM | POA: Diagnosis not present

## 2019-07-09 DIAGNOSIS — I10 Essential (primary) hypertension: Secondary | ICD-10-CM | POA: Diagnosis not present

## 2019-07-09 MED ORDER — NICOTINE 14 MG/24HR TD PT24: 14.0000 mg | MEDICATED_PATCH | Freq: Every day | TRANSDERMAL | 0 refills | Status: AC

## 2019-07-09 MED ORDER — NICOTINE 7 MG/24HR TD PT24: 7.0000 mg | MEDICATED_PATCH | Freq: Every day | TRANSDERMAL | 0 refills | Status: AC

## 2019-07-09 NOTE — Telephone Encounter (Signed)
Received call from patient.   Requested to decrease nicotine patches to taper off.   Prescription sent to pharmacy for taper doses.

## 2019-07-24 DIAGNOSIS — J449 Chronic obstructive pulmonary disease, unspecified: Secondary | ICD-10-CM | POA: Diagnosis not present

## 2019-07-24 DIAGNOSIS — J84112 Idiopathic pulmonary fibrosis: Secondary | ICD-10-CM | POA: Diagnosis not present

## 2019-08-09 DIAGNOSIS — J449 Chronic obstructive pulmonary disease, unspecified: Secondary | ICD-10-CM | POA: Diagnosis not present

## 2019-08-09 DIAGNOSIS — I10 Essential (primary) hypertension: Secondary | ICD-10-CM | POA: Diagnosis not present

## 2019-08-13 ENCOUNTER — Other Ambulatory Visit: Payer: Self-pay | Admitting: Family Medicine

## 2019-08-20 ENCOUNTER — Other Ambulatory Visit: Payer: Self-pay

## 2019-08-20 ENCOUNTER — Encounter: Payer: Self-pay | Admitting: Family Medicine

## 2019-08-20 ENCOUNTER — Ambulatory Visit (INDEPENDENT_AMBULATORY_CARE_PROVIDER_SITE_OTHER): Payer: Medicare Other | Admitting: Family Medicine

## 2019-08-20 VITALS — BP 110/68 | HR 82 | Temp 98.2°F | Resp 16

## 2019-08-20 DIAGNOSIS — J84112 Idiopathic pulmonary fibrosis: Secondary | ICD-10-CM

## 2019-08-20 DIAGNOSIS — F0390 Unspecified dementia without behavioral disturbance: Secondary | ICD-10-CM | POA: Diagnosis not present

## 2019-08-20 DIAGNOSIS — Z0001 Encounter for general adult medical examination with abnormal findings: Secondary | ICD-10-CM | POA: Diagnosis not present

## 2019-08-20 DIAGNOSIS — I6523 Occlusion and stenosis of bilateral carotid arteries: Secondary | ICD-10-CM

## 2019-08-20 DIAGNOSIS — I10 Essential (primary) hypertension: Secondary | ICD-10-CM

## 2019-08-20 DIAGNOSIS — Z23 Encounter for immunization: Secondary | ICD-10-CM | POA: Diagnosis not present

## 2019-08-20 DIAGNOSIS — Z0000 Encounter for general adult medical examination without abnormal findings: Secondary | ICD-10-CM

## 2019-08-20 LAB — CBC WITH DIFFERENTIAL/PLATELET
Absolute Monocytes: 431 cells/uL (ref 200–950)
Basophils Absolute: 83 cells/uL (ref 0–200)
Basophils Relative: 1.4 %
Eosinophils Absolute: 425 cells/uL (ref 15–500)
Eosinophils Relative: 7.2 %
HCT: 41.9 % (ref 38.5–50.0)
Hemoglobin: 14.1 g/dL (ref 13.2–17.1)
Lymphs Abs: 2224 cells/uL (ref 850–3900)
MCH: 30.9 pg (ref 27.0–33.0)
MCHC: 33.7 g/dL (ref 32.0–36.0)
MCV: 91.9 fL (ref 80.0–100.0)
MPV: 10.4 fL (ref 7.5–12.5)
Monocytes Relative: 7.3 %
Neutro Abs: 2738 cells/uL (ref 1500–7800)
Neutrophils Relative %: 46.4 %
Platelets: 205 10*3/uL (ref 140–400)
RBC: 4.56 10*6/uL (ref 4.20–5.80)
RDW: 12.5 % (ref 11.0–15.0)
Total Lymphocyte: 37.7 %
WBC: 5.9 10*3/uL (ref 3.8–10.8)

## 2019-08-20 LAB — COMPREHENSIVE METABOLIC PANEL
AG Ratio: 1.3 (calc) (ref 1.0–2.5)
ALT: 6 U/L — ABNORMAL LOW (ref 9–46)
AST: 12 U/L (ref 10–35)
Albumin: 3.9 g/dL (ref 3.6–5.1)
Alkaline phosphatase (APISO): 90 U/L (ref 35–144)
BUN: 15 mg/dL (ref 7–25)
CO2: 27 mmol/L (ref 20–32)
Calcium: 9.2 mg/dL (ref 8.6–10.3)
Chloride: 106 mmol/L (ref 98–110)
Creat: 0.89 mg/dL (ref 0.70–1.11)
Globulin: 3 g/dL (calc) (ref 1.9–3.7)
Glucose, Bld: 80 mg/dL (ref 65–99)
Potassium: 4.7 mmol/L (ref 3.5–5.3)
Sodium: 142 mmol/L (ref 135–146)
Total Bilirubin: 0.7 mg/dL (ref 0.2–1.2)
Total Protein: 6.9 g/dL (ref 6.1–8.1)

## 2019-08-20 LAB — LIPID PANEL
Cholesterol: 118 mg/dL (ref <200)
HDL: 59 mg/dL (ref >=40)
LDL Cholesterol (Calc): 46 mg/dL (calc)
Non-HDL Cholesterol (Calc): 59 mg/dL (calc) (ref <130)
Total CHOL/HDL Ratio: 2 (calc) (ref <5.0)
Triglycerides: 58 mg/dL (ref <150)

## 2019-08-20 NOTE — Patient Instructions (Addendum)
Reschedule Lung appointment Dr. Chase Caller Reschedule with neurology  F/U 4 months

## 2019-08-20 NOTE — Progress Notes (Signed)
Subjective:   Patient presents for Medicare Annual/Subsequent preventive examination.     Pt here for wellness visit, no new concerns  nurse aide with him today    He still has some mild irritation on sacral decub and groin  using, cream as needed , today everything is good   they are using pressure pillow on wheelchair and chair   HTN- blood pressure has been good at home 130's/80's   Protein calorie malnutition- appetite is so/so , he still uses protein shakes , some days eats very well    Neurology- follows him for history of stroke , seizure, dementia , due to reschedule appt   OSA and Pulmonary fibrosis- using oxygen 2L at night      Review Past Medical/Family/Social: Per EMR    Risk Factors  Current exercise habits: None  Dietary issues discussed: Yes, ensure daily   Cardiac risk factors: STroke, HTN, Hyperlipidemia   Depression Screen  He does get depressed that he cannot do very much.  His wife is also no longer living.  He requires assistance with his ADLs. He is currently on Paxil 40 mg and lorazepam as needed.   Activities of Daily Living - Requires help for ADL'S In your present state of health, do you have any difficulty performing the following activities?:  Driving? Yes Managing money? Yes Feeding yourself? No  Getting from bed to chair? Yes  Climbing a flight of stairs? Yes Preparing food and eating?:Yes Bathing or showering? Yes Getting dressed: Yes Getting to the toilet? YES  Using the toiletYes Moving around from place to place: No  In the past year have you fallen or had a near fall?:No    Hearing Difficulties: Yes  Do you often ask people to speak up or repeat themselves? Yes  Do you experience ringing or noises in your ears? No Do you have difficulty understanding soft or whispered voices? Yes  Do you feel that you have a problem with memory? No Do you often misplace items? No  Do you feel safe at home? Yes  Cognitive Testing  Alert?  Yes Normal Appearance?Yes  Oriented to person? Yes Place? Yes  Time? Yes  Recall of three objects? Yes  Can perform simple calculations? Yes  Displays appropriate judgment?Yes  Can read the correct time from a watch face?Yes   List the Names of Other Physician/Practitioners you currently use:  [facility]  Podiatry- Dr. Caryl Comes    Screening Tests / Date Due for PNA booster COVID vaccine UTD Aged out of other PSA/Colonoscopy   ROS:  GEN- denies fatigue, fever, weight loss,weakness, recent illness HEENT- denies eye drainage, change in vision, nasal discharge, CVS- denies chest pain, palpitations RESP- denies SOB, cough, wheeze ABD- denies N/V, + constipation chronic -change in stools, abd pain GU- denies dysuria, hematuria, dribbling, incontinence MSK- denies joint pain, muscle aches, injury Neuro- denies headache, dizziness, syncope, seizure activity   PHYSICAL: vitals reviewed  GEN- NAD, alert and oriented x3 sitting in wheelchair HEENT- PERRL, EOMI, non injected sclera, pink conjunctiva, MMM, oropharynx clear CVS- RRR, no murmur RESP-CTAB ABD-NABS,soft,NT,ND EXT- No edema Pulses- Radial,2+ Assessment:    Annual wellness medicare exam   Plan:    During the course of the visit the patient was educated and counseled about appropriate screening and preventive services including:   HTN- controlled  H/o CVA- carotids UTD, on statin, f/ u neurology after eye appt   Seizures disorder- no seizures, continue keppra  Chronic constipation- controlled  most days on amitiza  MDD- mood stable on meds, difficult a he requires help with ADL'S, living with ailing sister, misses his wife   Continue supplements  Pulmonary fibrosis- continue nebs as needed, oxygen therapy  PNA booster given today   Recommend flu shot in Sept             Diet review for nutrition referral? Yes ____ Not Indicated __x__  Patient Instructions (the written plan)  was given to the patient.  Medicare Attestation  I have personally reviewed:  The patient's medical and social history  Their use of alcohol, tobacco or illicit drugs  Their current medications and supplements  The patient's functional ability including ADLs,fall risks, home safety risks, cognitive, and hearing and visual impairment  Diet and physical activities  Evidence for depression or mood disorders  The patient's weight, height, BMI, and visual acuity have been recorded in the chart. I have made referrals, counseling, and provided education to the patient based on review of the above and I have provided the patient with a written personalized care plan for preventive services.

## 2019-08-24 DIAGNOSIS — J84112 Idiopathic pulmonary fibrosis: Secondary | ICD-10-CM | POA: Diagnosis not present

## 2019-08-24 DIAGNOSIS — J449 Chronic obstructive pulmonary disease, unspecified: Secondary | ICD-10-CM | POA: Diagnosis not present

## 2019-08-25 ENCOUNTER — Encounter: Payer: Self-pay | Admitting: *Deleted

## 2019-08-25 DIAGNOSIS — H5462 Unqualified visual loss, left eye, normal vision right eye: Secondary | ICD-10-CM | POA: Diagnosis not present

## 2019-08-25 DIAGNOSIS — H401123 Primary open-angle glaucoma, left eye, severe stage: Secondary | ICD-10-CM | POA: Diagnosis not present

## 2019-08-25 DIAGNOSIS — H401112 Primary open-angle glaucoma, right eye, moderate stage: Secondary | ICD-10-CM | POA: Diagnosis not present

## 2019-08-25 DIAGNOSIS — Z961 Presence of intraocular lens: Secondary | ICD-10-CM | POA: Diagnosis not present

## 2019-08-25 DIAGNOSIS — H2702 Aphakia, left eye: Secondary | ICD-10-CM | POA: Diagnosis not present

## 2019-09-01 ENCOUNTER — Other Ambulatory Visit: Payer: Self-pay | Admitting: Internal Medicine

## 2019-09-01 ENCOUNTER — Other Ambulatory Visit: Payer: Self-pay | Admitting: Family Medicine

## 2019-09-04 ENCOUNTER — Telehealth: Payer: Self-pay | Admitting: Family Medicine

## 2019-09-04 NOTE — Progress Notes (Signed)
  Chronic Care Management   Outreach Note  09/04/2019 Name: [name] MRN: [id number] DOB: 1934/09/08  Referred by: Alycia Rossetti, MD Reason for referral : No chief complaint on file.   An unsuccessful telephone outreach was attempted today. The patient was referred to the pharmacist for assistance with care management and care coordination.   Follow Up Plan:   Carley Perdue [employer] Scheduler

## 2019-09-05 ENCOUNTER — Telehealth: Payer: Self-pay | Admitting: Family Medicine

## 2019-09-05 NOTE — Progress Notes (Signed)
  Chronic Care Management   Note  09/05/2019 Name: Wisdom Rickey MRN: [id number] DOB: Jul 15, 1934  Jamiel Goncalves is a 85 y.o. year old male who is a primary care patient of [place], Modena Nunnery, MD. I reached out to [name] by phone today in response to a referral sent by Mr. Kyl Thurman's PCP, Buelah Manis, Modena Nunnery, MD.   Mr. Ellwood was given information about Chronic Care Management services today including:  1. CCM service includes personalized support from designated clinical staff supervised by his physician, including individualized plan of care and coordination with other care providers 2. 24/7 contact phone numbers for assistance for urgent and routine care needs. 3. Service will only be billed when office clinical staff spend 20 minutes or more in a month to coordinate care. 4. Only one practitioner may furnish and bill the service in a calendar month. 5. The patient may stop CCM services at any time (effective at the end of the month) by phone call to the office staff.   Patient agreed to services and verbal consent obtained.   Follow up plan:   Carley Perdue [employer] Scheduler

## 2019-09-09 DIAGNOSIS — I10 Essential (primary) hypertension: Secondary | ICD-10-CM | POA: Diagnosis not present

## 2019-09-09 DIAGNOSIS — J449 Chronic obstructive pulmonary disease, unspecified: Secondary | ICD-10-CM | POA: Diagnosis not present

## 2019-09-16 DIAGNOSIS — L110 Acquired keratosis follicularis: Secondary | ICD-10-CM | POA: Diagnosis not present

## 2019-09-16 DIAGNOSIS — B351 Tinea unguium: Secondary | ICD-10-CM | POA: Diagnosis not present

## 2019-09-16 DIAGNOSIS — I739 Peripheral vascular disease, unspecified: Secondary | ICD-10-CM | POA: Diagnosis not present

## 2019-09-24 ENCOUNTER — Other Ambulatory Visit: Payer: Self-pay | Admitting: Family Medicine

## 2019-09-24 DIAGNOSIS — J449 Chronic obstructive pulmonary disease, unspecified: Secondary | ICD-10-CM | POA: Diagnosis not present

## 2019-09-24 DIAGNOSIS — J84112 Idiopathic pulmonary fibrosis: Secondary | ICD-10-CM | POA: Diagnosis not present

## 2019-10-09 DIAGNOSIS — J449 Chronic obstructive pulmonary disease, unspecified: Secondary | ICD-10-CM | POA: Diagnosis not present

## 2019-10-09 DIAGNOSIS — I10 Essential (primary) hypertension: Secondary | ICD-10-CM | POA: Diagnosis not present

## 2019-10-20 ENCOUNTER — Encounter: Payer: Self-pay | Admitting: Primary Care

## 2019-10-20 ENCOUNTER — Other Ambulatory Visit: Payer: Self-pay

## 2019-10-20 ENCOUNTER — Ambulatory Visit (INDEPENDENT_AMBULATORY_CARE_PROVIDER_SITE_OTHER): Payer: Medicare Other | Admitting: Primary Care

## 2019-10-20 VITALS — BP 130/80 | HR 83 | Temp 97.6°F

## 2019-10-20 DIAGNOSIS — J84112 Idiopathic pulmonary fibrosis: Secondary | ICD-10-CM | POA: Diagnosis not present

## 2019-10-20 DIAGNOSIS — G4733 Obstructive sleep apnea (adult) (pediatric): Secondary | ICD-10-CM

## 2019-10-20 MED ORDER — ALBUTEROL SULFATE (2.5 MG/3ML) 0.083% IN NEBU
INHALATION_SOLUTION | RESPIRATORY_TRACT | 5 refills | Status: DC
Start: 2019-10-20 — End: 2020-04-21

## 2019-10-20 NOTE — Assessment & Plan Note (Addendum)
Split night sleep study in January 2020 showed severe OSA, AHI 82.3/hr. He was started on BIPAP 18/14 cm h20. Patient is not currently compliant with therapy. Recommend patient resume BIPAP use 4-6 hours or more each night and follow- up with Dr. Ander Slade in 6-8 weeks.

## 2019-10-20 NOTE — Assessment & Plan Note (Addendum)
Most recent HRCT chest in October 2020 showed progression of fibrosis. His last pulmonary function testing was in 2019. Due to his hx dementia I do not think he would be able to accurately follow directions for another PFT or Spirometry. We could consider repeating HRCT, however, patient likely would not be ideal candidate for anti fibrotics d/t weight loss and GI side effects. I discussed this with patient and family and for right now we will continue to monitor symptoms. He has not complaints of shortness of breath. He has a rare dry cough and fatigue. Renew nebulizer supplies and sent in refill Albuterol q 6 hours prn shortness of breath. Recommend he follow-up with Dr. Chase Caller first available in [facility] clinic.

## 2019-10-20 NOTE — Progress Notes (Signed)
_0  ID: [name], male    DOB: 06-15-1934, 85 y.o.   MRN: [id number]  Chief Complaint  Patient presents with  . Follow-up    Pt states he has been doing okay since last visit and denies any real complaints.    Referring provider: Alycia Rossetti, MD  HPI: 85 year old male, former smoker. PMH significant for pulmonary fibrosis, OSA, insomnia, GERD, HTN, dementia, hx seizures, wheelchair bound, protein-calorie malnutrition. Patient of Dr. Chase Caller for ILD/ Dr. Ander Slade for OSA. Last seen by pulmonary NP on 08/19/18. Due for repeat CT chest in October 2020. Recommended 3 month follow-up.  10/20/2019- interim hx Patient presents today for regular follow-up. Patient is accompanied by male today, neither have a lot of knowledge regarding his medical care. He was last seen in August 2020, its been over a year since he has been seen in our office. HRCT in October 2020 showed progression of fibrosis, stable 75m left lower lobe pulmonary nodules. He was scheduled for visit with Ramaswamy on 11/25/18 that ended up being cancelled. Patient reports feeling "fine" today. He has not acute complaints. He has an occasional dry cough without mucus production. He is wheelchair bound, gets fatigued easily. He states that he has a nebulizer at home that he uses morning and evening. He has little appetite and has lost weight. Denies trouble breathing or shortness of breath.   Regarding his sleep apnea, he was last seen by Dr. OAnder Sladein January 2020. Sleep study on 01/24/18 showed severe obstructive sleep apnea. He was ordered for BIPAP therapy on 18/14 cm h20 with medium size [employer] full face mask. He has not been wearing BIPAP at night. Continues to have trouble staying asleep at night and reports snoring. He was started on BIPAP but has not been wearing mask at night.  Allergies  Allergen Reactions  . Aspirin Nausea And Vomiting    Tolerates 878mdaily    Immunization History  Administered  Date(s) Administered  . Fluad Quad(high Dose 65+) 10/08/2018  . Influenza, High Dose Seasonal PF 09/18/2017  . Influenza,inj,Quad PF,6+ Mos 10/02/2016  . Moderna SARS-COVID-2 Vaccination 03/27/2019, 04/18/2019  . Pneumococcal Conjugate-13 02/02/2017  . Pneumococcal Polysaccharide-23 11/14/1995, 08/20/2019  . Tdap 04/22/2017, 04/25/2017    Past Medical History:  Diagnosis Date  . Abnormality of gait 08/01/2013  . Arthritis   . Blindness of left eye    Posttraumatic  . [place]id artery disease [place]  . Colon polyp   . Constipation   . Diaphragmatic hernia   . Disc disease, degenerative, cervical   . Diverticulitis   . ED (erectile dysfunction)   . Gastroparesis   . GERD (gastroesophageal reflux disease)   . Glaucoma   . Heart disease   . Hyperlipidemia   . Hypertension   . Insomnia   . Memory difficulty 08/04/2014  . Nicotine dependence   . Numbness and tingling of right arm 08/04/2014  . Osteoarthritis   . Overactive bladder   . Radiculopathy   . Shortness of breath    with activity  . Sleep apnea    does not use CPAP.  Tested maybe 5- 10 years ago  . [facility]   Left side weakness.  . Unilateral inguinal hernia     Tobacco History: Social History   Tobacco Use  Smoking Status Former Smoker  . Packs/day: 0.50  . Years: 60.00  . Pack years: 30.00  . Types: Cigarettes  Smokeless Tobacco Never Used  Tobacco Comment  using patch   Counseling given: Not Answered Comment: using patch   Outpatient Medications Prior to Visit  Medication Sig Dispense Refill  . albuterol (PROVENTIL HFA;VENTOLIN HFA) 108 (90 Base) MCG/ACT inhaler Inhale 2 puffs into the lungs every 4 (four) hours as needed for wheezing or shortness of breath. 1 Inhaler 0  . amLODipine (NORVASC) 5 MG tablet TAKE (1) TABLET BY MOUTH AT BEDTIME. 30 tablet 6  . aspirin 325 MG tablet Take 325 mg by mouth daily.    [name] atorvastatin (LIPITOR) 40 MG tablet TAKE (1) TABLET BY MOUTH AT BEDTIME. 30 tablet 0    . Cholecalciferol (VITAMIN D-3) 25 MCG (1000 UT) CAPS Take 1 capsule (1,000 Units total) by mouth daily. 30 capsule 6  . clotrimazole-betamethasone (LOTRISONE) cream APPLY 1 APPLICATION TOPICALLY TWICE DAILY. 45 g 0  . fluticasone (FLONASE) 50 MCG/ACT nasal spray USE 1 OR 2 SPRAYS IN EACH NOSTRIL EVERY DAY FOR NASAL ALLERGIES. 16 g 3  . levETIRAcetam (KEPPRA) 250 MG tablet TAKE (1) TABLET BY MOUTH EACH MORNING. 30 tablet 0  . lisinopril (ZESTRIL) 10 MG tablet TAKE 1 TABLET BY MOUTH ONCE A DAY FOR HIGH BLOOD PRESSURE. TAKE WITH 20MG TABLET FOR TOTAL DOSE OF 30MG. 30 tablet 0  . lisinopril (ZESTRIL) 20 MG tablet TAKE 1 TABLET BY MOUTH ONCE A DAY. 30 tablet 2  . LORazepam (ATIVAN) 1 MG tablet TAKE (1) TABLET BY MOUTH AT BEDTIME. 30 tablet 2  . lubiprostone (AMITIZA) 24 MCG capsule TAKE 1 CAPSULE BY MOUTH TWICE DAILY WITH MEALS. 60 capsule 6  . MYRBETRIQ 25 MG TB24 tablet TAKE 1 TABLET BY MOUTH ONCE A DAY. 30 tablet 0  . naproxen sodium (ALEVE) 220 MG tablet Take 440 mg by mouth daily as needed.    [name] NITROSTAT 0.4 MG SL tablet PLACE 1 TAB UNDER TONGUE EVERY 5 MIN IF NEEDED FOR CHEST PAIN. MAY USE 3 TIMES.NO RELIEF CALL 911. 25 tablet 0  . nystatin (MYCOSTATIN/NYSTOP) powder APPLY TOPCIALLY TO AFFECTED AREAS 4 TIMES DAILY. 60 g 5  . omeprazole (PRILOSEC) 20 MG capsule TAKE 1 CAPSULE BY MOUTH ONCE DAILY FOR RELFUX, ESOPHAGITIS, OR STOMACH ULCERS. 30 capsule 0  . PARoxetine (PAXIL) 40 MG tablet TAKE 1 TABLET BY MOUTH ONCE A DAY. 30 tablet 6  . Respiratory Therapy Supplies (NEBULIZER/TUBING/MOUTHPIECE) KIT Disp one nebulizer machine, tubing set and mouthpiece kit 1 each 0  . thiamine 100 MG tablet TAKE 1 TABLET BY MOUTH ONCE A DAY. 30 tablet 6  . traZODone (DESYREL) 100 MG tablet TAKE (1) TABLET BY MOUTH AT BEDTIME. 30 tablet 0  . vitamin B-12 (CYANOCOBALAMIN) 1000 MCG tablet TAKE 1 TABLET BY MOUTH ONCE A DAY. 30 tablet 0  . Zinc Oxide 50.5 % CREA 1 application to perineum and buttocks with each brief  change. 1000 g 3  . albuterol (PROVENTIL) (2.5 MG/3ML) 0.083% nebulizer solution USE 1 VIAL IN NEBULIZER EVERY 6 HOURS AS NEEDED FOR SHORTNESS OF BREATH AND WHEEZING 150 mL 5  . aspirin EC 81 MG tablet Take 162 mg by mouth at bedtime.     . mupirocin cream (BACTROBAN) 2 % APPLY TOPICALLY TO GROIN DAILY FOR 2 WEEKS 15 g 0  . Vitamin D, Cholecalciferol, 25 MCG (1000 UT) TABS TAKE 1 TABLET BY MOUTH ONCE DAILY. 30 tablet 0   No facility-administered medications prior to visit.   Review of Systems  Review of Systems  Constitutional: Positive for unexpected weight change.  Respiratory: Positive for cough. Negative for chest tightness, shortness  of breath and wheezing.   Psychiatric/Behavioral: Positive for sleep disturbance.   Physical Exam  BP 130/80 (BP Location: Right Arm, Cuff Size: Normal)   Pulse 83   Temp 97.6 F (36.4 C) (Other (Comment)) Comment (Src): wrist  SpO2 97%  Physical Exam Constitutional:      General: He is not in acute distress.    Appearance: Normal appearance. He is ill-appearing. He is not diaphoretic.     Comments: Underweight  HENT:     Head: Normocephalic and atraumatic.     Mouth/Throat:     Comments: Deferred d/t masking Cardiovascular:     Rate and Rhythm: Normal rate and regular rhythm.  Pulmonary:     Effort: Pulmonary effort is normal. No respiratory distress.     Breath sounds: Rales present. No wheezing or rhonchi.  Musculoskeletal:     Comments: WC bound  Neurological:     General: No focal deficit present.     Mental Status: He is alert and oriented to person, place, and time. Mental status is at baseline.  Psychiatric:        Mood and Affect: Mood normal.        Behavior: Behavior normal.        Thought Content: Thought content normal.        Judgment: Judgment normal.      Lab Results:  CBC    Component Value Date/Time   WBC 5.9 08/20/2019 1110   RBC 4.56 08/20/2019 1110   HGB 14.1 08/20/2019 1110   HGB 12.7 10/22/2012 0000    HCT 41.9 08/20/2019 1110   HCT 38 10/22/2012 0000   PLT 205 08/20/2019 1110   MCV 91.9 08/20/2019 1110   MCH 30.9 08/20/2019 1110   MCHC 33.7 08/20/2019 1110   RDW 12.5 08/20/2019 1110   LYMPHSABS 2,224 08/20/2019 1110   MONOABS 1.0 04/22/2017 1449   EOSABS 425 08/20/2019 1110   BASOSABS 83 08/20/2019 1110    BMET    Component Value Date/Time   NA 142 08/20/2019 1110   K 4.7 08/20/2019 1110   CL 106 08/20/2019 1110   CO2 27 08/20/2019 1110   GLUCOSE 80 08/20/2019 1110   BUN 15 08/20/2019 1110   CREATININE 0.89 08/20/2019 1110   CALCIUM 9.2 08/20/2019 1110   GFRNONAA 78 10/04/2017 1034   GFRAA 90 10/04/2017 1034    BNP No results found for: BNP  ProBNP No results found for: PROBNP  Imaging: No results found.   Assessment & Plan:   IPF (idiopathic pulmonary fibrosis) ([place]) Most recent HRCT chest in October 2020 showed progression of fibrosis. His last pulmonary function testing was in 2019. Due to his hx dementia I do not think he would be able to accurately follow directions for another PFT or Spirometry. We could consider repeating HRCT, however, patient likely would not be ideal candidate for anti fibrotics d/t weight loss and GI side effects. I discussed this with patient and family and for right now we will continue to monitor symptoms. He has not complaints of shortness of breath. He has a rare dry cough and fatigue. Renew nebulizer supplies and sent in refill Albuterol q 6 hours prn shortness of breath. Recommend he follow-up with Dr. Chase Caller first available in ILD clinic.    OSA (obstructive sleep apnea) Split night sleep study in January 2020 showed severe OSA, AHI 82.3/hr. He was started on BIPAP 18/14 cm h20. Patient is not currently compliant with therapy. Recommend patient resume BIPAP use 4-6  hours or more each night and follow- up with Dr. Ander Slade in 6-8 weeks.    Martyn Ehrich, NP 10/20/2019

## 2019-10-20 NOTE — Patient Instructions (Addendum)
You have fibrosis of your lungs, last CT chest showed progression. This is treated with medication called antivirotic, they have side effects that can cause weight loss and GI symptoms such as nausea/vomitting or diarrhea. I do not suspect that you would tolerate this medication well and would likely impact your life in a negative way. I recommend just monitoring your respiratory symptoms. You can discuss this further with DR. Ramaswamy who is your LUNG doctor.  I do think you could benefit from wearing CPAP at night. Please put this mask on when you go to bed EVERY night for a minimum of 4 hours or more. This may help you sleep better and stay asleep at night.   Orders: - Renew nebulizer supplies re: pulmonary fibrosis   Follow-up: - First available with Dr. Chase Caller 30 min slot ILD clinic  - Dr. Ander Slade in 6-8 weeks for sleep apnea

## 2019-10-21 ENCOUNTER — Other Ambulatory Visit: Payer: Self-pay | Admitting: Family Medicine

## 2019-10-21 DIAGNOSIS — J84112 Idiopathic pulmonary fibrosis: Secondary | ICD-10-CM | POA: Diagnosis not present

## 2019-10-21 NOTE — Telephone Encounter (Signed)
Ok to refill??  Last office visit 08/20/2019.  Last refill 06/24/2019, #2 refills.

## 2019-10-22 DIAGNOSIS — J84112 Idiopathic pulmonary fibrosis: Secondary | ICD-10-CM | POA: Diagnosis not present

## 2019-10-24 DIAGNOSIS — J449 Chronic obstructive pulmonary disease, unspecified: Secondary | ICD-10-CM | POA: Diagnosis not present

## 2019-10-24 DIAGNOSIS — J84112 Idiopathic pulmonary fibrosis: Secondary | ICD-10-CM | POA: Diagnosis not present

## 2019-10-24 NOTE — Chronic Care Management (AMB) (Deleted)
Chronic Care Management Pharmacy  Name: [name]  MRN: [id number] DOB: 06-May-1934  Chief Complaint/ HPI  [name],  85 y.o. , male presents for their Initial CCM visit with the clinical pharmacist {CHL HP Upstream Pharm visit HBZJ:[id number]}.  PCP : Alycia Rossetti, MD  Their chronic conditions include: HTN, CAD, GERD, Dementia, Depression.  Office Visits: 08/20/2019 [facility]) - General physical, mild irritation on groin region that he is using cream as needed to treat.  Using protein shakes for decreased appetite.  No changes to medications, he does require help with ADL's and is currently living with ailing sister, reports missing his wife.  Consult Visit:***  Medications: Outpatient Encounter Medications as of 10/28/2019  Medication Sig  . albuterol (PROVENTIL HFA;VENTOLIN HFA) 108 (90 Base) MCG/ACT inhaler Inhale 2 puffs into the lungs every 4 (four) hours as needed for wheezing or shortness of breath.  [name] albuterol (PROVENTIL) (2.5 MG/3ML) 0.083% nebulizer solution USE 1 VIAL IN NEBULIZER EVERY 6 HOURS AS NEEDED FOR SHORTNESS OF BREATH AND WHEEZING  . amLODipine (NORVASC) 5 MG tablet TAKE (1) TABLET BY MOUTH AT BEDTIME.  [name] aspirin 325 MG tablet Take 325 mg by mouth daily.  [name] atorvastatin (LIPITOR) 40 MG tablet TAKE (1) TABLET BY MOUTH AT BEDTIME.  [name] Cholecalciferol (VITAMIN D-3) 25 MCG (1000 UT) CAPS Take 1 capsule (1,000 Units total) by mouth daily.  . clotrimazole-betamethasone (LOTRISONE) cream APPLY 1 APPLICATION TOPICALLY TWICE DAILY.  . fluticasone (FLONASE) 50 MCG/ACT nasal spray USE 1 OR 2 SPRAYS IN EACH NOSTRIL EVERY DAY FOR NASAL ALLERGIES.  [name] levETIRAcetam (KEPPRA) 250 MG tablet TAKE (1) TABLET BY MOUTH EACH MORNING.  [name] lisinopril (ZESTRIL) 10 MG tablet TAKE 1 TABLET BY MOUTH ONCE A DAY FOR HIGH BLOOD PRESSURE. TAKE WITH 20MG TABLET FOR TOTAL DOSE OF 30MG.  [name] lisinopril (ZESTRIL) 20 MG tablet TAKE 1 TABLET BY MOUTH ONCE A DAY. TAKE WITH 10MG TABLET FOR TOTAL DOSE  OF 30MG.  . LORazepam (ATIVAN) 1 MG tablet TAKE (1) TABLET BY MOUTH AT BEDTIME.  [name] lubiprostone (AMITIZA) 24 MCG capsule TAKE 1 CAPSULE BY MOUTH TWICE DAILY WITH MEALS.  [name] MYRBETRIQ 25 MG TB24 tablet TAKE 1 TABLET BY MOUTH ONCE A DAY.  . naproxen sodium (ALEVE) 220 MG tablet Take 440 mg by mouth daily as needed.  [name] NITROSTAT 0.4 MG SL tablet PLACE 1 TAB UNDER TONGUE EVERY 5 MIN IF NEEDED FOR CHEST PAIN. MAY USE 3 TIMES.NO RELIEF CALL 911.  . nystatin (MYCOSTATIN/NYSTOP) powder APPLY TOPCIALLY TO AFFECTED AREAS 4 TIMES DAILY.  [name] omeprazole (PRILOSEC) 20 MG capsule TAKE 1 CAPSULE BY MOUTH ONCE DAILY FOR RELFUX, ESOPHAGITIS, OR STOMACH ULCERS.  [name] PARoxetine (PAXIL) 40 MG tablet TAKE 1 TABLET BY MOUTH ONCE A DAY.  [name] Respiratory Therapy Supplies (NEBULIZER/TUBING/MOUTHPIECE) KIT Disp one nebulizer machine, tubing set and mouthpiece kit  . thiamine 100 MG tablet TAKE 1 TABLET BY MOUTH ONCE A DAY.  . traZODone (DESYREL) 100 MG tablet TAKE (1) TABLET BY MOUTH AT BEDTIME.  . vitamin B-12 (CYANOCOBALAMIN) 1000 MCG tablet TAKE 1 TABLET BY MOUTH ONCE A DAY.  [name] Zinc Oxide 17.5 % CREA 1 application to perineum and buttocks with each brief change.   No facility-administered encounter medications on file as of 10/28/2019.     Current Diagnosis/Assessment:  Goals Addressed   None     Hypertension   BP goal is:  {CHL HP UPSTREAM Pharmacist BP ranges:[phone]}  Office blood pressures are  BP Readings from Last 3  Encounters:  10/20/19 130/80  08/20/19 110/68  05/14/19 (!) 142/86   Patient checks BP at home {CHL HP BP Monitoring Frequency:[phone]} Patient home BP readings are ranging: ***  Patient has failed these meds in the past: *** Patient is currently {CHL Controlled/Uncontrolled:[phone]} on the following medications:  . ***  We discussed {CHL HP Upstream Pharmacy [phone]}  Plan  Continue {CHL HP Upstream Pharmacy [phone]}     GERD   Patient has  failed these meds in past: *** Patient is currently {CHL Controlled/Uncontrolled:[phone]} on the following medications:  . ***  We discussed:  ***  Plan  Continue {CHL HP Upstream Pharmacy UEBVP:[id number]} Dementia   Patient has failed these meds in past: *** Patient is currently {CHL Controlled/Uncontrolled:[phone]} on the following medications:  . ***  We discussed:  ***  Plan  Continue {CHL HP Upstream Pharmacy GQHQI:[id number]} Depression   Patient has failed these meds in past: *** Patient is currently {CHL Controlled/Uncontrolled:[phone]} on the following medications:  . ***  We discussed:  ***  Plan  Continue {CHL HP Upstream Pharmacy [phone]} CAD   Patient has failed these meds in past: *** Patient is currently {CHL Controlled/Uncontrolled:[phone]} on the following medications:  . ***  We discussed:  ***  Plan  Continue {CHL HP Upstream Pharmacy QJSID:[id number]} Vaccines   Reviewed and discussed patient's vaccination history.    Immunization History  Administered Date(s) Administered  . Fluad Quad(high Dose 65+) 10/08/2018  . Influenza, High Dose Seasonal PF 09/18/2017  . Influenza,inj,Quad PF,6+ Mos 10/02/2016  . Moderna SARS-COVID-2 Vaccination 03/27/2019, 04/18/2019  . Pneumococcal Conjugate-13 02/02/2017  . Pneumococcal Polysaccharide-23 11/14/1995, 08/20/2019  . Tdap 04/22/2017, 04/25/2017    Plan  Recommended patient receive *** vaccine in *** office.  Medication Management   . Miscellaneous medications: *** . OTC's: *** . Patient currently uses *** pharmacy.  Phone #  [phone] *** . Patient reports using *** method to organize medications and promote adherence. . Patient reports/denies missed doses of medication ***   Beverly Milch, PharmD Clinical Pharmacist [place] [phone]

## 2019-10-27 ENCOUNTER — Telehealth: Payer: Self-pay | Admitting: Pharmacist

## 2019-10-27 NOTE — Progress Notes (Signed)
Chronic Care Management Pharmacy Assistant   Name: [name]  MRN: [id number] DOB: [birth year]  Reason for Encounter: Initial Questions   PCP : Alycia Rossetti, MD  Allergies:   Allergies  Allergen Reactions   Aspirin Nausea And Vomiting    Tolerates 52m daily    Medications: Outpatient Encounter Medications as of 10/27/2019  Medication Sig   albuterol (PROVENTIL HFA;VENTOLIN HFA) 108 (90 Base) MCG/ACT inhaler Inhale 2 puffs into the lungs every 4 (four) hours as needed for wheezing or shortness of breath.   albuterol (PROVENTIL) (2.5 MG/3ML) 0.083% nebulizer solution USE 1 VIAL IN NEBULIZER EVERY 6 HOURS AS NEEDED FOR SHORTNESS OF BREATH AND WHEEZING   amLODipine (NORVASC) 5 MG tablet TAKE (1) TABLET BY MOUTH AT BEDTIME.   aspirin 325 MG tablet Take 325 mg by mouth daily.   atorvastatin (LIPITOR) 40 MG tablet TAKE (1) TABLET BY MOUTH AT BEDTIME.   Cholecalciferol (VITAMIN D-3) 25 MCG (1000 UT) CAPS Take 1 capsule (1,000 Units total) by mouth daily.   clotrimazole-betamethasone (LOTRISONE) cream APPLY 1 APPLICATION TOPICALLY TWICE DAILY.   fluticasone (FLONASE) 50 MCG/ACT nasal spray USE 1 OR 2 SPRAYS IN EACH NOSTRIL EVERY DAY FOR NASAL ALLERGIES.   levETIRAcetam (KEPPRA) 250 MG tablet TAKE (1) TABLET BY MOUTH EACH MORNING.   lisinopril (ZESTRIL) 10 MG tablet TAKE 1 TABLET BY MOUTH ONCE A DAY FOR HIGH BLOOD PRESSURE. TAKE WITH 20MG TABLET FOR TOTAL DOSE OF 30MG.   lisinopril (ZESTRIL) 20 MG tablet TAKE 1 TABLET BY MOUTH ONCE A DAY. TAKE WITH 10MG TABLET FOR TOTAL DOSE OF 30MG.   LORazepam (ATIVAN) 1 MG tablet TAKE (1) TABLET BY MOUTH AT BEDTIME.   lubiprostone (AMITIZA) 24 MCG capsule TAKE 1 CAPSULE BY MOUTH TWICE DAILY WITH MEALS.   MYRBETRIQ 25 MG TB24 tablet TAKE 1 TABLET BY MOUTH ONCE A DAY.   naproxen sodium (ALEVE) 220 MG tablet Take 440 mg by mouth daily as needed.   NITROSTAT 0.4 MG SL tablet PLACE 1 TAB UNDER TONGUE EVERY 5 MIN IF NEEDED FOR CHEST  PAIN. MAY USE 3 TIMES.NO RELIEF CALL 911.   nystatin (MYCOSTATIN/NYSTOP) powder APPLY TOPCIALLY TO AFFECTED AREAS 4 TIMES DAILY.   omeprazole (PRILOSEC) 20 MG capsule TAKE 1 CAPSULE BY MOUTH ONCE DAILY FOR RELFUX, ESOPHAGITIS, OR STOMACH ULCERS.   PARoxetine (PAXIL) 40 MG tablet TAKE 1 TABLET BY MOUTH ONCE A DAY.   Respiratory Therapy Supplies (NEBULIZER/TUBING/MOUTHPIECE) KIT Disp one nebulizer machine, tubing set and mouthpiece kit   thiamine 100 MG tablet TAKE 1 TABLET BY MOUTH ONCE A DAY.   traZODone (DESYREL) 100 MG tablet TAKE (1) TABLET BY MOUTH AT BEDTIME.   vitamin B-12 (CYANOCOBALAMIN) 1000 MCG tablet TAKE 1 TABLET BY MOUTH ONCE A DAY.   Zinc Oxide 303.4% CREA 1 application to perineum and buttocks with each brief change.   No facility-administered encounter medications on file as of 10/27/2019.    Current Diagnosis: Patient Active Problem List   Diagnosis Date Noted   Cerebrovascular accident (CVA) due to embolism of left middle cerebral artery ([place] 12/14/2018   Abnormal findings on diagnostic imaging of lung 08/19/2018   [place] ([place]opathic pulmonary fibrosis) ([place] 08/19/2018   OSA (obstructive sleep apnea) 08/19/2018   Carotid artery disease ([place] 03/26/2017   Internal carotid artery stenosis, left 03/06/2017   Dysphagia 03/06/2017   Pulmonary nodules 03/06/2017   Seizure ([place] 03/03/2017   Protein-calorie malnutrition ([place] 01/10/2017   Chronic pain 10/02/2016   Depression, major, single episode,  moderate ([place]) 08/31/2016   Chronic insomnia 08/31/2016   Lumbar spinal stenosis 08/18/2016   Wheelchair bound 08/18/2016   Dementia ([place]) 08/18/2016   Urinary incontinence 08/18/2016   Polypharmacy 08/18/2016   Laryngopharyngeal reflux (LPR) 08/12/2015   Memory difficulty 08/04/2014   Numbness and tingling of right arm 08/04/2014   Abnormality of gait 08/01/2013   Anemia 12/05/2012   Early satiety 12/03/2012   History of colon polyps  12/03/2012   Facial droop 10/12/2012   Spinal stenosis of cervical region 07/01/2012   GERD 05/27/2009   DYSPHAGIA UNSPECIFIED 05/27/2009   CHANGE IN BOWELS 05/27/2009   SMOKER 09/09/2008   Essential hypertension 09/09/2008   History of stroke 09/09/2008   Constipation 09/09/2008   HIGH BLOOD PRESSURE 07/25/2006    Goals Addressed   None    Chronic Care Management   Outreach Note  10/28/2019 Name: [name] MRN: [id number] DOB: 1934-03-28  Referred by: Alycia Rossetti, MD Reason for referral : Chronic Care Management   A second unsuccessful telephone outreach was attempted today. The patient was referred to pharmacist for assistance with care management and care coordination. I was unable to make contact with the patient prior to his initial visit.  Spoke with patient's sister, [name]. She asked that I call the patient back in an hour because he was resting.    Follow-Up:  Pharmacist Review   Fanny Skates, [place] Pharmacist Assistant [phone]

## 2019-10-28 ENCOUNTER — Telehealth: Payer: Self-pay | Admitting: Family Medicine

## 2019-10-28 ENCOUNTER — Ambulatory Visit: Payer: Medicare Other

## 2019-10-28 NOTE — Progress Notes (Signed)
  Chronic Care Management   Note  10/28/2019 Name: [name] MRN: [id number] DOB: 1934-03-31  [name] is a 85 y.o. year old male who is a primary care patient of Percy, Modena Nunnery, MD. I reached out to [name] by phone today in response to a referral sent by [name]'s PCP, Buelah Manis, Modena Nunnery, MD.   Mr. Dunn was given information about Chronic Care Management services today including:  1. CCM service includes personalized support from designated clinical staff supervised by his physician, including individualized plan of care and coordination with other care providers 2. 24/7 contact phone numbers for assistance for urgent and routine care needs. 3. Service will only be billed when office clinical staff spend 20 minutes or more in a month to coordinate care. 4. Only one practitioner may furnish and bill the service in a calendar month. 5. The patient may stop CCM services at any time (effective at the end of the month) by phone call to the office staff.   Patient agreed to services and verbal consent obtained.   Follow up plan:   Carley Perdue [facility] Scheduler

## 2019-11-04 ENCOUNTER — Other Ambulatory Visit: Payer: Self-pay | Admitting: *Deleted

## 2019-11-04 DIAGNOSIS — Z8673 Personal history of transient ischemic attack (TIA), and cerebral infarction without residual deficits: Secondary | ICD-10-CM

## 2019-11-04 DIAGNOSIS — F0390 Unspecified dementia without behavioral disturbance: Secondary | ICD-10-CM

## 2019-11-04 DIAGNOSIS — L89152 Pressure ulcer of sacral region, stage 2: Secondary | ICD-10-CM

## 2019-11-04 DIAGNOSIS — J84112 Idiopathic pulmonary fibrosis: Secondary | ICD-10-CM

## 2019-11-04 DIAGNOSIS — I10 Essential (primary) hypertension: Secondary | ICD-10-CM

## 2019-11-04 DIAGNOSIS — I6523 Occlusion and stenosis of bilateral carotid arteries: Secondary | ICD-10-CM

## 2019-11-06 NOTE — Chronic Care Management (AMB) (Signed)
Chronic Care Management Pharmacy  Name: [name]  MRN: [id number] DOB: [birth year]  Chief Complaint/ HPI  [name],  85 y.o. , male presents for their Initial CCM visit with the clinical pharmacist In office.  PCP : Alycia Rossetti, MD  Their chronic conditions include: CAD, GERD,  HTN, Hx of Stroke, Urinary incontinence.  Office Visits: 08/20/2019 [facility]) - AWV, appetite is somewhat diminished, he is using protein shakes, he requires help with his ADL's, currently living with his ailing wife  Consult Visit: 10/20/2019 [facility], Pulmonology) - Regular follow up for pulmonary fibrosis, complains of rare dry cough and fatigue.  Continued nebulizer and gave albuterol prn for SOB.  Medications: Outpatient Encounter Medications as of 11/11/2019  Medication Sig  . albuterol (PROVENTIL HFA;VENTOLIN HFA) 108 (90 Base) MCG/ACT inhaler Inhale 2 puffs into the lungs every 4 (four) hours as needed for wheezing or shortness of breath.  [name] albuterol (PROVENTIL) (2.5 MG/3ML) 0.083% nebulizer solution USE 1 VIAL IN NEBULIZER EVERY 6 HOURS AS NEEDED FOR SHORTNESS OF BREATH AND WHEEZING  . amLODipine (NORVASC) 5 MG tablet TAKE (1) TABLET BY MOUTH AT BEDTIME.  [name] aspirin 325 MG tablet Take 325 mg by mouth daily.  [name] atorvastatin (LIPITOR) 40 MG tablet TAKE (1) TABLET BY MOUTH AT BEDTIME.  [name] Cholecalciferol (VITAMIN D-3) 25 MCG (1000 UT) CAPS Take 1 capsule (1,000 Units total) by mouth daily.  . clotrimazole-betamethasone (LOTRISONE) cream APPLY 1 APPLICATION TOPICALLY TWICE DAILY.  . fluticasone (FLONASE) 50 MCG/ACT nasal spray USE 1 OR 2 SPRAYS IN EACH NOSTRIL EVERY DAY FOR NASAL ALLERGIES.  [name] levETIRAcetam (KEPPRA) 250 MG tablet TAKE (1) TABLET BY MOUTH EACH MORNING.  [name] lisinopril (ZESTRIL) 10 MG tablet TAKE 1 TABLET BY MOUTH ONCE A DAY FOR HIGH BLOOD PRESSURE. TAKE WITH 20MG TABLET FOR TOTAL DOSE OF 30MG.  [name] lisinopril (ZESTRIL) 20 MG tablet TAKE 1 TABLET BY MOUTH ONCE A DAY. TAKE WITH 10MG TABLET  FOR TOTAL DOSE OF 30MG.  . LORazepam (ATIVAN) 1 MG tablet TAKE (1) TABLET BY MOUTH AT BEDTIME.  [name] lubiprostone (AMITIZA) 24 MCG capsule TAKE 1 CAPSULE BY MOUTH TWICE DAILY WITH MEALS.  [name] MYRBETRIQ 25 MG TB24 tablet TAKE 1 TABLET BY MOUTH ONCE A DAY.  . naproxen sodium (ALEVE) 220 MG tablet Take 440 mg by mouth daily as needed.  [name] NITROSTAT 0.4 MG SL tablet PLACE 1 TAB UNDER TONGUE EVERY 5 MIN IF NEEDED FOR CHEST PAIN. MAY USE 3 TIMES.NO RELIEF CALL 911.  . nystatin (MYCOSTATIN/NYSTOP) powder APPLY TOPCIALLY TO AFFECTED AREAS 4 TIMES DAILY.  [name] omeprazole (PRILOSEC) 20 MG capsule TAKE 1 CAPSULE BY MOUTH ONCE DAILY FOR RELFUX, ESOPHAGITIS, OR STOMACH ULCERS.  [name] PARoxetine (PAXIL) 40 MG tablet TAKE 1 TABLET BY MOUTH ONCE A DAY.  [name] Respiratory Therapy Supplies (NEBULIZER/TUBING/MOUTHPIECE) KIT Disp one nebulizer machine, tubing set and mouthpiece kit  . thiamine 100 MG tablet TAKE 1 TABLET BY MOUTH ONCE A DAY.  . traZODone (DESYREL) 100 MG tablet TAKE (1) TABLET BY MOUTH AT BEDTIME.  . vitamin B-12 (CYANOCOBALAMIN) 1000 MCG tablet TAKE 1 TABLET BY MOUTH ONCE A DAY.  [name] Zinc Oxide 82.4 % CREA 1 application to perineum and buttocks with each brief change.   No facility-administered encounter medications on file as of 11/11/2019.     Current Diagnosis/Assessment:   Emergency planning/management officer Strain: Low Risk   . Difficulty of Paying Living Expenses: Not very hard    Goals Addressed  This Visit's Progress   . Pharmacy Care Plan:       CARE PLAN ENTRY (see longitudinal plan of care for additional care plan information)  Current Barriers:  . Chronic Disease Management support, education, and care coordination needs related to Hypertension and history of stroke, and urinary incontinence.   Hypertension BP Readings from Last 3 Encounters:  10/20/19 130/80  08/20/19 110/68  05/14/19 (!) 142/86   . Pharmacist Clinical Goal(s): o Over the next 180 days, patient will work with PharmD and  providers to maintain BP goal <130/80 . Current regimen:  o Lisinopril 109m o Amlodipine 553m. Interventions: o Reviewed home blood pressure monitoring o Discussed medication adherence o Verified pill packaging from pharmacy . Patient self care activities - Over the next 180 days, patient will: o Check BP daily, document, and provide at future appointments o Ensure daily salt intake < 2300 mg/day Urinary Incontinency . Pharmacist Clinical Goal(s) o Over the next 180 days, patient will work with PharmD and providers to optimize medication and minimize symptoms of urinary incontinence. . Current regimen:  o Discussed cost of current regimen o Verified pill package from pharmacy . Interventions: o Reviewed current symptoms and bladder control. . Patient self care activities - Over the next 180 days, patient will: o Continue to focus on medication adherence o Contact providers with new or worsening symptoms.  Hx of Stroke . Pharmacist Clinical Goal(s) o Over the next 180 days, patient will work with PharmD and providers to optimize medication or minimize symptoms related to stroke. . Current regimen:  o ASA 325 mg o Atorvastatin 4080m Interventions: o Discussed importance of medication adherence o Discussed risk factors associated with CV events . Patient self care activities - Over the next 180 days, patient will: o Continue to focus on medication adherence  Initial goal documentation       Hypertension   BP goal is:  <130/80  Office blood pressures are  BP Readings from Last 3 Encounters:  10/20/19 130/80  08/20/19 110/68  05/14/19 (!) 142/86   Patient checks BP at home daily Patient home BP readings are ranging: 138/78 today per aide  Patient has failed these meds in the past: none noted Patient is currently controlled on the following medications:  . Lisinopril 65m84mAmlodipine 5mg 34m discussed   Adherent with medication daily  Does have rare dry  cough, states he has had this for a while now  Occasional swelling in feet/ankles  Counseled on elevating feet whenever possible  Discussed administration time and indication for all medications  Plan  Continue current medications   Urinary incontinence   Patient has failed these meds in past: none noted Patient is currently controlled on the following medications:  . MyrMarland Kitchenetriq 25mg 40mdiscussed:    Patient takes medication daily  Reports using bedside urinal at night time  He has liner under wheel chair today  Denies issues with cost  Plan  Continue current medications Hx of Stroke   Patient has failed these meds in past: none noted Patient is currently controlled on the following medications:  . ASA 325mg .55mrvastatin 40mg . 8mostat 0.4mg SL  27mdiscussed:    Lipid panel is WNL at last OV  Discussed importance of statin medications on prevention of cardiovascular events  Denies abnormal bleeding/bruising from ASA  Denies chest pain  Plan  Continue current medications GERD   Patient has failed these meds in past: none noted Patient is  currently controlled on the following medications:  . Omeprazole 49m  We discussed:    Takes in the morning before eating as directed  Denies any issues with Acid reflux at this time  Plan  Continue current medications CAD   Patient has failed these meds in past: none noted Patient is currently controlled on the following medications:  . ASA 3239m. Atorvastatin 4034mWe discussed:    Controlling risk factors  Importance of medication adherence on prevention of CV events  When to report to ER  Plan  Continue current medications  Vaccines   Reviewed and discussed patient's vaccination history.    Immunization History  Administered Date(s) Administered  . Fluad Quad(high Dose 65+) 10/08/2018, 11/11/2019  . Influenza, High Dose Seasonal PF 09/18/2017  . Influenza,inj,Quad PF,6+ Mos  10/02/2016  . Moderna SARS-COVID-2 Vaccination 03/27/2019, 04/18/2019  . Pneumococcal Conjugate-13 02/02/2017  . Pneumococcal Polysaccharide-23 11/14/1995, 08/20/2019  . Tdap 04/22/2017, 04/25/2017   Plan Patient was given flu vaccine today in the office. Medication Management   . Miscellaneous medications:  o Albuterol 0.083% nebulizer soln o Fluticasone 12m89mrn o Paroxetine 40mg60mTC's:  o ASA 325mg 73mtamin B-12 o Thaimine 100mg o32mamin D3 . Patient currently uses [facility] [employer] #  [phone] brings in pill packs organized by pharmacy which his family uses to ensure adherence.  They put these into a pill box for him weekly. . Patient denies missed doses of medication.   ChristiBeverly MilchD Clinical Pharmacist [phone]

## 2019-11-09 DIAGNOSIS — I10 Essential (primary) hypertension: Secondary | ICD-10-CM | POA: Diagnosis not present

## 2019-11-09 DIAGNOSIS — J449 Chronic obstructive pulmonary disease, unspecified: Secondary | ICD-10-CM | POA: Diagnosis not present

## 2019-11-10 ENCOUNTER — Telehealth: Payer: Self-pay | Admitting: Pharmacist

## 2019-11-10 NOTE — Progress Notes (Signed)
Chronic Care Management Pharmacy Assistant   Name: [name]  MRN: [id number] DOB: 07/26/1934  Reason for Encounter: Initial Questions   PCP : Alycia Rossetti, MD  Allergies:   Allergies  Allergen Reactions   Aspirin Nausea And Vomiting    Tolerates 69m daily    Medications: Outpatient Encounter Medications as of 11/10/2019  Medication Sig   albuterol (PROVENTIL HFA;VENTOLIN HFA) 108 (90 Base) MCG/ACT inhaler Inhale 2 puffs into the lungs every 4 (four) hours as needed for wheezing or shortness of breath.   albuterol (PROVENTIL) (2.5 MG/3ML) 0.083% nebulizer solution USE 1 VIAL IN NEBULIZER EVERY 6 HOURS AS NEEDED FOR SHORTNESS OF BREATH AND WHEEZING   amLODipine (NORVASC) 5 MG tablet TAKE (1) TABLET BY MOUTH AT BEDTIME.   aspirin 325 MG tablet Take 325 mg by mouth daily.   atorvastatin (LIPITOR) 40 MG tablet TAKE (1) TABLET BY MOUTH AT BEDTIME.   Cholecalciferol (VITAMIN D-3) 25 MCG (1000 UT) CAPS Take 1 capsule (1,000 Units total) by mouth daily.   clotrimazole-betamethasone (LOTRISONE) cream APPLY 1 APPLICATION TOPICALLY TWICE DAILY.   fluticasone (FLONASE) 50 MCG/ACT nasal spray USE 1 OR 2 SPRAYS IN EACH NOSTRIL EVERY DAY FOR NASAL ALLERGIES.   levETIRAcetam (KEPPRA) 250 MG tablet TAKE (1) TABLET BY MOUTH EACH MORNING.   lisinopril (ZESTRIL) 10 MG tablet TAKE 1 TABLET BY MOUTH ONCE A DAY FOR HIGH BLOOD PRESSURE. TAKE WITH 20MG TABLET FOR TOTAL DOSE OF 30MG.   lisinopril (ZESTRIL) 20 MG tablet TAKE 1 TABLET BY MOUTH ONCE A DAY. TAKE WITH 10MG TABLET FOR TOTAL DOSE OF 30MG.   LORazepam (ATIVAN) 1 MG tablet TAKE (1) TABLET BY MOUTH AT BEDTIME.   lubiprostone (AMITIZA) 24 MCG capsule TAKE 1 CAPSULE BY MOUTH TWICE DAILY WITH MEALS.   MYRBETRIQ 25 MG TB24 tablet TAKE 1 TABLET BY MOUTH ONCE A DAY.   naproxen sodium (ALEVE) 220 MG tablet Take 440 mg by mouth daily as needed.   NITROSTAT 0.4 MG SL tablet PLACE 1 TAB UNDER TONGUE EVERY 5 MIN IF NEEDED FOR CHEST  PAIN. MAY USE 3 TIMES.NO RELIEF CALL 911.   nystatin (MYCOSTATIN/NYSTOP) powder APPLY TOPCIALLY TO AFFECTED AREAS 4 TIMES DAILY.   omeprazole (PRILOSEC) 20 MG capsule TAKE 1 CAPSULE BY MOUTH ONCE DAILY FOR RELFUX, ESOPHAGITIS, OR STOMACH ULCERS.   PARoxetine (PAXIL) 40 MG tablet TAKE 1 TABLET BY MOUTH ONCE A DAY.   Respiratory Therapy Supplies (NEBULIZER/TUBING/MOUTHPIECE) KIT Disp one nebulizer machine, tubing set and mouthpiece kit   thiamine 100 MG tablet TAKE 1 TABLET BY MOUTH ONCE A DAY.   traZODone (DESYREL) 100 MG tablet TAKE (1) TABLET BY MOUTH AT BEDTIME.   vitamin B-12 (CYANOCOBALAMIN) 1000 MCG tablet TAKE 1 TABLET BY MOUTH ONCE A DAY.   Zinc Oxide 321.7% CREA 1 application to perineum and buttocks with each brief change.   No facility-administered encounter medications on file as of 11/10/2019.    Current Diagnosis: Patient Active Problem List   Diagnosis Date Noted   Cerebrovascular accident (CVA) due to embolism of left middle cerebral artery ([facility] 12/14/2018   Abnormal findings on diagnostic imaging of lung 08/19/2018   IPF (idiopathic pulmonary fibrosis) ([facility] 08/19/2018   OSA (obstructive sleep apnea) 08/19/2018   Carotid artery disease ([facility] 03/26/2017   Internal carotid artery stenosis, left 03/06/2017   Dysphagia 03/06/2017   Pulmonary nodules 03/06/2017   Seizure ([facility] 03/03/2017   Protein-calorie malnutrition ([facility] [place] 01/10/2017   Chronic pain 10/02/2016   Depression, major, single episode,  moderate ([place]) 08/31/2016   Chronic insomnia 08/31/2016   Lumbar spinal stenosis 08/18/2016   Wheelchair bound 08/18/2016   Dementia ([place]) 08/18/2016   Urinary incontinence 08/18/2016   Polypharmacy 08/18/2016   Laryngopharyngeal reflux (LPR) 08/12/2015   Memory difficulty 08/04/2014   Numbness and tingling of right arm 08/04/2014   Abnormality of gait 08/01/2013   Anemia 12/05/2012   Early satiety 12/03/2012   History of colon polyps  12/03/2012   Facial droop 10/12/2012   Spinal stenosis of cervical region 07/01/2012   GERD 05/27/2009   DYSPHAGIA UNSPECIFIED 05/27/2009   CHANGE IN BOWELS 05/27/2009   SMOKER 09/09/2008   Essential hypertension 09/09/2008   History of stroke 09/09/2008   Constipation 09/09/2008   HIGH BLOOD PRESSURE 07/25/2006    Goals Addressed   None    Patient was unable to articulate responses to the initial questions with me. Gave permission for me to speak with his sister, [name], for all responses below.   Have you seen any other providers since your last visit? No   Any changes in your medications or health? No   Any side effects from any medications? No   Do you have an symptoms or problems not managed by your medications? No   Any concerns about your health right now? Not at this time   Has your provider asked that you check blood pressure, blood sugar, or follow special diet at home? Patient has an aide that helps him throughout the week. She checks his blood pressure every couple of days.   Do you get any type of exercise on a regular basis? Patient is in wheelchair, but his aide does leg exercises with him.   Can you think of a goal you would like to reach for your health? n/a   Do you have any problems getting your medications? No. Patient's sister stated someone delivers his medications.   Is there anything that you would like to discuss during the appointment? none  Reminded the patient to please have medications and supplements available at the time of his appointment, Tuesday November 9th at 11:00 am over the telephone.  Follow-Up:  Pharmacist Review   Fanny Skates, [place] Pharmacist Assistant [phone]

## 2019-11-11 ENCOUNTER — Ambulatory Visit (INDEPENDENT_AMBULATORY_CARE_PROVIDER_SITE_OTHER): Payer: Medicare Other | Admitting: *Deleted

## 2019-11-11 ENCOUNTER — Other Ambulatory Visit: Payer: Self-pay

## 2019-11-11 ENCOUNTER — Ambulatory Visit: Payer: Medicare Other | Admitting: Pharmacist

## 2019-11-11 DIAGNOSIS — I10 Essential (primary) hypertension: Secondary | ICD-10-CM

## 2019-11-11 DIAGNOSIS — Z23 Encounter for immunization: Secondary | ICD-10-CM

## 2019-11-11 DIAGNOSIS — Z8673 Personal history of transient ischemic attack (TIA), and cerebral infarction without residual deficits: Secondary | ICD-10-CM

## 2019-11-11 DIAGNOSIS — R32 Unspecified urinary incontinence: Secondary | ICD-10-CM

## 2019-11-11 NOTE — Patient Instructions (Addendum)
Visit Information Thank you for meeting with me today!  I look forward to working with you to help you meet all of your healthcare goals and answer any questions you may have.  Feel free to contact me anytime!   Goals Addressed            This Visit's Progress   . Pharmacy Care Plan:       CARE PLAN ENTRY (see longitudinal plan of care for additional care plan information)  Current Barriers:  . Chronic Disease Management support, education, and care coordination needs related to Hypertension and history of stroke, and urinary incontinence.   Hypertension BP Readings from Last 3 Encounters:  10/20/19 130/80  08/20/19 110/68  05/14/19 (!) 142/86   . Pharmacist Clinical Goal(s): o Over the next 180 days, patient will work with PharmD and providers to maintain BP goal <130/80 . Current regimen:  o Lisinopril 30mg  o Amlodipine 5mg  . Interventions: o Reviewed home blood pressure monitoring o Discussed medication adherence o Verified pill packaging from pharmacy . Patient self care activities - Over the next 180 days, patient will: o Check BP daily, document, and provide at future appointments o Ensure daily salt intake < 2300 mg/day Urinary Incontinency . Pharmacist Clinical Goal(s) o Over the next 180 days, patient will work with PharmD and providers to optimize medication and minimize symptoms of urinary incontinence. . Current regimen:  o Discussed cost of current regimen o Verified pill package from pharmacy . Interventions: o Reviewed current symptoms and bladder control. . Patient self care activities - Over the next 180 days, patient will: o Continue to focus on medication adherence o Contact providers with new or worsening symptoms.  Hx of Stroke . Pharmacist Clinical Goal(s) o Over the next 180 days, patient will work with PharmD and providers to optimize medication or minimize symptoms related to stroke. . Current regimen:  o ASA 325 mg o Atorvastatin  40mg  . Interventions: o Discussed importance of medication adherence o Discussed risk factors associated with CV events . Patient self care activities - Over the next 180 days, patient will: o Continue to focus on medication adherence  Initial goal documentation        [name] was given information about Chronic Care Management services today including:  1. CCM service includes personalized support from designated clinical staff supervised by his physician, including individualized plan of care and coordination with other care providers 2. 24/7 contact phone numbers for assistance for urgent and routine care needs. 3. Standard insurance, coinsurance, copays and deductibles apply for chronic care management only during months in which we provide at least 20 minutes of these services. Most insurances cover these services at 100%, however patients may be responsible for any copay, coinsurance and/or deductible if applicable. This service may help you avoid the need for more expensive face-to-face services. 4. Only one practitioner may furnish and bill the service in a calendar month. 5. The patient may stop CCM services at any time (effective at the end of the month) by phone call to the office staff.  Patient agreed to services and verbal consent obtained.   The patient verbalized understanding of instructions provided today and agreed to receive a mailed copy of patient instruction and/or educational materials. Telephone follow up appointment with pharmacy team member scheduled for: 6 months  Beverly Milch, PharmD Clinical Pharmacist [facility] [phone]  Preventing Hypertension Hypertension, commonly called high blood pressure, is when the force of blood pumping through  the arteries is too strong. Arteries are blood vessels that carry blood from the heart throughout the body. Over time, hypertension can damage the arteries and decrease blood flow to important  parts of the body, including the brain, heart, and kidneys. Often, hypertension does not cause symptoms until blood pressure is very high. For this reason, it is important to have your blood pressure checked on a regular basis. Hypertension can often be prevented with diet and lifestyle changes. If you already have hypertension, you can control it with diet and lifestyle changes, as well as medicine. What nutrition changes can be made? Maintain a healthy diet. This includes:  Eating less salt (sodium). Ask your health care provider how much sodium is safe for you to have. The general recommendation is to consume less than 1 tsp (2,300 mg) of sodium a day. ? Do not add salt to your food. ? Choose low-sodium options when grocery shopping and eating out.  Limiting fats in your diet. You can do this by eating low-fat or fat-free dairy products and by eating less red meat.  Eating more fruits, vegetables, and whole grains. Make a goal to eat: ? 1-2 cups of fresh fruits and vegetables each day. ? 3-4 servings of whole grains each day.  Avoiding foods and beverages that have added sugars.  Eating fish that contain healthy fats (omega-3 fatty acids), such as mackerel or salmon. If you need help putting together a healthy eating plan, try the DASH diet. This diet is high in fruits, vegetables, and whole grains. It is low in sodium, red meat, and added sugars. DASH stands for Dietary Approaches to Stop Hypertension. What lifestyle changes can be made?   Lose weight if you are overweight. Losing just 3?5% of your body weight can help prevent or control hypertension. ? For example, if your present weight is 200 lb (91 kg), a loss of 3-5% of your weight means losing 6-10 lb (2.7-4.5 kg). ? Ask your health care provider to help you with a diet and exercise plan to safely lose weight.  Get enough exercise. Do at least 150 minutes of moderate-intensity exercise each week. ? You could do this in short  exercise sessions several times a day, or you could do longer exercise sessions a few times a week. For example, you could take a brisk 10-minute walk or bike ride, 3 times a day, for 5 days a week.  Find ways to reduce stress, such as exercising, meditating, listening to music, or taking a yoga class. If you need help reducing stress, ask your health care provider.  Do not smoke. This includes e-cigarettes. Chemicals in tobacco and nicotine products raise your blood pressure each time you smoke. If you need help quitting, ask your health care provider.  Avoid alcohol. If you drink alcohol, limit alcohol intake to no more than 1 drink a day for nonpregnant women and 2 drinks a day for men. One drink equals 12 oz of beer, 5 oz of wine, or 1 oz of hard liquor. Why are these changes important? Diet and lifestyle changes can help you prevent hypertension, and they may make you feel better overall and improve your quality of life. If you have hypertension, making these changes will help you control it and help prevent major complications, such as:  Hardening and narrowing of arteries that supply blood to: ? Your heart. This can cause a heart attack. ? Your brain. This can cause a stroke. ? Your kidneys. This can cause  kidney failure.  Stress on your heart muscle, which can cause heart failure. What can I do to lower my risk?  Work with your health care provider to make a hypertension prevention plan that works for you. Follow your plan and keep all follow-up visits as told by your health care provider.  Learn how to check your blood pressure at home. Make sure that you know your personal target blood pressure, as told by your health care provider. How is this treated? In addition to diet and lifestyle changes, your health care provider may recommend medicines to help lower your blood pressure. You may need to try a few different medicines to find what works best for you. You also may need to take  more than one medicine. Take over-the-counter and prescription medicines only as told by your health care provider. Where to find support Your health care provider can help you prevent hypertension and help you keep your blood pressure at a healthy level. Your local hospital or your community may also provide support services and prevention programs. The American Heart Association offers an online support network at: CheapBootlegs.com.cy Where to find more information Learn more about hypertension from:  [place], Lung, and Blood Institute: ElectronicHangman.is  Centers for Disease Control and Prevention: https://ingram.com/  American Academy of Family Physicians: http://familydoctor.org/familydoctor/en/diseases-conditions/high-blood-pressure.printerview.all.html Learn more about the DASH diet from:  [place], Lung, and [place]: https://www.reyes.com/ Contact a health care provider if:  You think you are having a reaction to medicines you have taken.  You have recurrent headaches or feel dizzy.  You have swelling in your ankles.  You have trouble with your vision. Summary  Hypertension often does not cause any symptoms until blood pressure is very high. It is important to get your blood pressure checked regularly.  Diet and lifestyle changes are the most important steps in preventing hypertension.  By keeping your blood pressure in a healthy range, you can prevent complications like heart attack, heart failure, stroke, and kidney failure.  Work with your health care provider to make a hypertension prevention plan that works for you. This information is not intended to replace advice given to you by your health care provider. Make sure you discuss any questions you have with your health care provider. Document Revised: 04/12/2018 Document Reviewed: 08/30/2015 Elsevier  Patient Education  2020 [employer].

## 2019-11-18 ENCOUNTER — Telehealth: Payer: Self-pay | Admitting: *Deleted

## 2019-11-18 DIAGNOSIS — R39198 Other difficulties with micturition: Secondary | ICD-10-CM | POA: Diagnosis not present

## 2019-11-18 DIAGNOSIS — R059 Cough, unspecified: Secondary | ICD-10-CM | POA: Diagnosis not present

## 2019-11-18 DIAGNOSIS — R509 Fever, unspecified: Secondary | ICD-10-CM | POA: Diagnosis not present

## 2019-11-18 DIAGNOSIS — R5383 Other fatigue: Secondary | ICD-10-CM | POA: Diagnosis not present

## 2019-11-18 NOTE — Telephone Encounter (Signed)
Received call from [place],  [facility] aide with patient. [phone]~ telephone.   Reports that patient is running a fever (T max 100.6) and has not been able to void. Advised that patient will need to be evaluated and as no appointments available at office, advised to take patient to [facility].

## 2019-11-18 NOTE — Telephone Encounter (Signed)
Agree pt needs to be evaluated today

## 2019-11-24 DIAGNOSIS — J84112 Idiopathic pulmonary fibrosis: Secondary | ICD-10-CM | POA: Diagnosis not present

## 2019-11-24 DIAGNOSIS — J449 Chronic obstructive pulmonary disease, unspecified: Secondary | ICD-10-CM | POA: Diagnosis not present

## 2019-11-25 ENCOUNTER — Ambulatory Visit: Payer: Medicare Other | Admitting: Internal Medicine

## 2019-12-01 ENCOUNTER — Ambulatory Visit: Payer: Medicare Other | Admitting: Pulmonary Disease

## 2019-12-02 DIAGNOSIS — B351 Tinea unguium: Secondary | ICD-10-CM | POA: Diagnosis not present

## 2019-12-02 DIAGNOSIS — L110 Acquired keratosis follicularis: Secondary | ICD-10-CM | POA: Diagnosis not present

## 2019-12-02 DIAGNOSIS — I739 Peripheral vascular disease, unspecified: Secondary | ICD-10-CM | POA: Diagnosis not present

## 2019-12-08 DIAGNOSIS — I10 Essential (primary) hypertension: Secondary | ICD-10-CM | POA: Diagnosis not present

## 2019-12-08 DIAGNOSIS — J449 Chronic obstructive pulmonary disease, unspecified: Secondary | ICD-10-CM | POA: Diagnosis not present

## 2019-12-23 ENCOUNTER — Encounter: Payer: Self-pay | Admitting: Family Medicine

## 2019-12-23 ENCOUNTER — Ambulatory Visit (INDEPENDENT_AMBULATORY_CARE_PROVIDER_SITE_OTHER): Payer: Medicare Other | Admitting: Family Medicine

## 2019-12-23 ENCOUNTER — Other Ambulatory Visit: Payer: Self-pay

## 2019-12-23 VITALS — BP 128/66 | HR 78 | Temp 97.8°F | Resp 18

## 2019-12-23 DIAGNOSIS — N390 Urinary tract infection, site not specified: Secondary | ICD-10-CM | POA: Diagnosis not present

## 2019-12-23 DIAGNOSIS — G6289 Other specified polyneuropathies: Secondary | ICD-10-CM | POA: Diagnosis not present

## 2019-12-23 DIAGNOSIS — F5104 Psychophysiologic insomnia: Secondary | ICD-10-CM

## 2019-12-23 DIAGNOSIS — F0390 Unspecified dementia without behavioral disturbance: Secondary | ICD-10-CM | POA: Diagnosis not present

## 2019-12-23 DIAGNOSIS — E538 Deficiency of other specified B group vitamins: Secondary | ICD-10-CM | POA: Diagnosis not present

## 2019-12-23 DIAGNOSIS — Z993 Dependence on wheelchair: Secondary | ICD-10-CM | POA: Diagnosis not present

## 2019-12-23 DIAGNOSIS — R7309 Other abnormal glucose: Secondary | ICD-10-CM | POA: Diagnosis not present

## 2019-12-23 LAB — URINALYSIS, ROUTINE W REFLEX MICROSCOPIC
Bilirubin Urine: NEGATIVE
Glucose, UA: NEGATIVE
Hyaline Cast: NONE SEEN /LPF
Ketones, ur: NEGATIVE
Nitrite: POSITIVE — AB
Specific Gravity, Urine: 1.02 (ref 1.001–1.03)
Squamous Epithelial / HPF: NONE SEEN /HPF (ref <=5)
pH: 7 (ref 5.0–8.0)

## 2019-12-23 LAB — MICROSCOPIC MESSAGE

## 2019-12-23 NOTE — Patient Instructions (Signed)
We will call with results F/U 4 months  

## 2019-12-23 NOTE — Progress Notes (Signed)
Subjective:    Patient ID: [name], male    DOB: December 01, 1934, 85 y.o.   MRN: [id number]  Patient presents for Follow-up (Is fasting/) and Leg Pain (BLE Pain at night- feels like it's aching and tingling from bottom of calf to bottom of feet)  Patient here follow-up chronic medical problems.  Medications reviewed.  He complains of bilateral leg pain.  He is wheelchair bound most of day, but gets an aching and tingling in achilles region and heels/feet. He has known PVD  epson salt soaks help  HTN- blood pressure has been good at home 130's/80's   Protein calorie malnutition- appetite is so/so , he still uses protein shakes , some days eats very well    Neurology- follows him for history of stroke , seizure, dementia , due to reschedule appt    Discussed COVID-19 Booster  He had visit to UC in Nov, treated for UTI , still has odor but no burning, no abd pain, no confusion  they are concerned it has not resolved    Review Of Systems:  GEN- denies fatigue, fever, weight loss,weakness, recent illness HEENT- denies eye drainage, change in vision, nasal discharge, CVS- denies chest pain, palpitations RESP- denies SOB, cough, wheeze ABD- denies N/V, change in stools, abd pain GU- denies dysuria, hematuria, dribbling, incontinence MSK- + joint pain, muscle aches, injury Neuro- denies headache, dizziness, syncope, seizure activity       Objective:    BP 128/66   Pulse 78   Temp 97.8 F (36.6 C) (Temporal)   Resp 18   SpO2 100%  GEN- NAD, alert and oriented x3, sitting in wheelchair  HEENT- PERRL, EOMI, non injected sclera, pink conjunctiva  Neck- Supple, no thyromegaly CVS- RRR, no murmur RESP-CTAB ABD-NABS,soft,NT,ND, no CVA tenderness  EXT- No edema Neuro- decreased muscle tone LE, sensation grossly in tact LE, decreased monofilament bilat heels to mid foot Pulses- Radial, DP- 2+        Assessment & Plan:      Problem List Items Addressed This Visit       Unprioritized   Chronic insomnia   Dementia ([place]) - Primary    Patient requires 24-hour assistance.  He asked about his sleep medications unfortunately he explained to bed between 5-6pm so his sleep medicine does wear off in the middle the night but he does not try to get up and goes right back to sleep a few hours later.  At this time I do not recommend change in his sleep medication as I do not want him taking any extra benzo trazodone in the middle the night.  With regards to his feet I think that he is having some neuropathy symptoms negative check A1c, B12 on him He is on some oral B12 but may need injections. At this time I would not add any other neuropathic medication and continue with topicals and Epson salt soaks if that helps.   Recent UTI we will recheck urine culture his urinalysis showed that he may still have infection but he does not have any classic symptoms like he did before treatment.      Wheelchair bound    Other Visit Diagnoses    Other polyneuropathy       Relevant Orders   CBC with Differential/Platelet (Completed)   Comprehensive metabolic panel (Completed)   Hemoglobin A1c (Completed)   Vitamin B12 (Completed)   Urinary tract infection without hematuria, site unspecified       Relevant Orders  Urinalysis, Routine w reflex microscopic (Completed)   Urine Culture   B12 deficiency       Relevant Orders   Vitamin B12 (Completed)      Note: This dictation was prepared with Dragon dictation along with smaller phrase technology. Any transcriptional errors that result from this process are unintentional.

## 2019-12-24 ENCOUNTER — Encounter: Payer: Self-pay | Admitting: Family Medicine

## 2019-12-24 DIAGNOSIS — J449 Chronic obstructive pulmonary disease, unspecified: Secondary | ICD-10-CM | POA: Diagnosis not present

## 2019-12-24 DIAGNOSIS — J84112 Idiopathic pulmonary fibrosis: Secondary | ICD-10-CM | POA: Diagnosis not present

## 2019-12-24 LAB — CBC WITH DIFFERENTIAL/PLATELET
Absolute Monocytes: 440 cells/uL (ref 200–950)
Basophils Absolute: 87 cells/uL (ref 0–200)
Basophils Relative: 1.4 %
Eosinophils Absolute: 645 cells/uL — ABNORMAL HIGH (ref 15–500)
Eosinophils Relative: 10.4 %
HCT: 39.3 % (ref 38.5–50.0)
Hemoglobin: 13 g/dL — ABNORMAL LOW (ref 13.2–17.1)
Lymphs Abs: 2139 cells/uL (ref 850–3900)
MCH: 30.2 pg (ref 27.0–33.0)
MCHC: 33.1 g/dL (ref 32.0–36.0)
MCV: 91.2 fL (ref 80.0–100.0)
MPV: 10.4 fL (ref 7.5–12.5)
Monocytes Relative: 7.1 %
Neutro Abs: 2889 cells/uL (ref 1500–7800)
Neutrophils Relative %: 46.6 %
Platelets: 191 10*3/uL (ref 140–400)
RBC: 4.31 10*6/uL (ref 4.20–5.80)
RDW: 13.2 % (ref 11.0–15.0)
Total Lymphocyte: 34.5 %
WBC: 6.2 10*3/uL (ref 3.8–10.8)

## 2019-12-24 LAB — COMPREHENSIVE METABOLIC PANEL
AG Ratio: 1.2 (calc) (ref 1.0–2.5)
ALT: 7 U/L — ABNORMAL LOW (ref 9–46)
AST: 16 U/L (ref 10–35)
Albumin: 3.7 g/dL (ref 3.6–5.1)
Alkaline phosphatase (APISO): 96 U/L (ref 35–144)
BUN: 15 mg/dL (ref 7–25)
CO2: 27 mmol/L (ref 20–32)
Calcium: 9.3 mg/dL (ref 8.6–10.3)
Chloride: 105 mmol/L (ref 98–110)
Creat: 0.72 mg/dL (ref 0.70–1.11)
Globulin: 3.1 g/dL (calc) (ref 1.9–3.7)
Glucose, Bld: 84 mg/dL (ref 65–99)
Potassium: 4.6 mmol/L (ref 3.5–5.3)
Sodium: 141 mmol/L (ref 135–146)
Total Bilirubin: 0.5 mg/dL (ref 0.2–1.2)
Total Protein: 6.8 g/dL (ref 6.1–8.1)

## 2019-12-24 LAB — HEMOGLOBIN A1C
Hgb A1c MFr Bld: 5.9 % of total Hgb — ABNORMAL HIGH (ref <5.7)
Mean Plasma Glucose: 123 mg/dL
eAG (mmol/L): 6.8 mmol/L

## 2019-12-24 LAB — VITAMIN B12: Vitamin B-12: 1807 pg/mL — ABNORMAL HIGH (ref 200–1100)

## 2019-12-24 NOTE — Assessment & Plan Note (Signed)
Patient requires 24-hour assistance.  He asked about his sleep medications unfortunately he explained to bed between 5-6pm so his sleep medicine does wear off in the middle the night but he does not try to get up and goes right back to sleep a few hours later.  At this time I do not recommend change in his sleep medication as I do not want him taking any extra benzo trazodone in the middle the night.  With regards to his feet I think that he is having some neuropathy symptoms negative check A1c, B12 on him He is on some oral B12 but may need injections. At this time I would not add any other neuropathic medication and continue with topicals and Epson salt soaks if that helps.   Recent UTI we will recheck urine culture his urinalysis showed that he may still have infection but he does not have any classic symptoms like he did before treatment.

## 2019-12-25 LAB — URINE CULTURE
MICRO NUMBER:: 11343165
SPECIMEN QUALITY:: ADEQUATE

## 2019-12-25 MED ORDER — CIPROFLOXACIN HCL 250 MG PO TABS: 250.0000 mg | ORAL_TABLET | Freq: Two times a day (BID) | ORAL | 0 refills | Status: DC

## 2019-12-25 NOTE — Addendum Note (Signed)
Addended by: Vic Blackbird F on: 12/25/2019 04:26 PM   Modules accepted: Orders

## 2019-12-30 ENCOUNTER — Other Ambulatory Visit: Payer: Self-pay | Admitting: Family Medicine

## 2020-01-09 DIAGNOSIS — I10 Essential (primary) hypertension: Secondary | ICD-10-CM | POA: Diagnosis not present

## 2020-01-09 DIAGNOSIS — J449 Chronic obstructive pulmonary disease, unspecified: Secondary | ICD-10-CM | POA: Diagnosis not present

## 2020-01-13 ENCOUNTER — Other Ambulatory Visit: Payer: Self-pay | Admitting: *Deleted

## 2020-01-24 DIAGNOSIS — J84112 Idiopathic pulmonary fibrosis: Secondary | ICD-10-CM | POA: Diagnosis not present

## 2020-01-24 DIAGNOSIS — J449 Chronic obstructive pulmonary disease, unspecified: Secondary | ICD-10-CM | POA: Diagnosis not present

## 2020-01-29 ENCOUNTER — Other Ambulatory Visit: Payer: Self-pay | Admitting: Family Medicine

## 2020-02-09 DIAGNOSIS — J449 Chronic obstructive pulmonary disease, unspecified: Secondary | ICD-10-CM | POA: Diagnosis not present

## 2020-02-09 DIAGNOSIS — I10 Essential (primary) hypertension: Secondary | ICD-10-CM | POA: Diagnosis not present

## 2020-02-11 ENCOUNTER — Other Ambulatory Visit: Payer: Self-pay

## 2020-02-11 ENCOUNTER — Ambulatory Visit (INDEPENDENT_AMBULATORY_CARE_PROVIDER_SITE_OTHER): Payer: Medicare Other | Admitting: Nurse Practitioner

## 2020-02-11 VITALS — BP 112/60 | HR 87 | Temp 97.2°F | Ht 69.0 in | Wt 162.0 lb

## 2020-02-11 DIAGNOSIS — N3090 Cystitis, unspecified without hematuria: Secondary | ICD-10-CM | POA: Diagnosis not present

## 2020-02-11 DIAGNOSIS — R8271 Bacteriuria: Secondary | ICD-10-CM

## 2020-02-11 DIAGNOSIS — R34 Anuria and oliguria: Secondary | ICD-10-CM | POA: Diagnosis not present

## 2020-02-11 LAB — URINALYSIS, ROUTINE W REFLEX MICROSCOPIC
Bilirubin Urine: NEGATIVE
Glucose, UA: NEGATIVE
Hyaline Cast: NONE SEEN /LPF
Ketones, ur: NEGATIVE
Nitrite: POSITIVE — AB
Specific Gravity, Urine: 1.02 (ref 1.001–1.03)
Squamous Epithelial / HPF: NONE SEEN /HPF (ref <=5)
pH: 6 (ref 5.0–8.0)

## 2020-02-11 LAB — MICROSCOPIC MESSAGE

## 2020-02-11 MED ORDER — CIPROFLOXACIN HCL 500 MG PO TABS: 500.0000 mg | ORAL_TABLET | Freq: Two times a day (BID) | ORAL | 0 refills | Status: DC

## 2020-02-11 NOTE — Progress Notes (Signed)
Subjective:    Patient ID: [name], male    DOB: 1934/08/29, 86 y.o.   MRN: [id number]  HPI: [name] is a 86 y.o. male presenting with nurse aid for "don't make enough water."  Chief Complaint  Patient presents with  . lack of urine    Feels that he does not go to the bathrm enough with urine and bm. Takes amitiza right now, does not have good eating habits   URINARY SYMPTOMS Duration: 1 week ago Dysuria: no Urinary frequency: no Urgency: yes Small volume voids: yes Symptom severity: mild Urinary incontinence: no Foul odor: yes Hematuria: yes Abdominal pain: no Back pain: no Suprapubic pain/pressure: no Flank pain: no Fever:  No Nausea: no Vomiting: no Relief with cranberry juice: no Relief with pyridium: none taken Status: better/worse/stable Previous urinary tract infection: yes; treated with Cipro in December for ongoing UTI Recurrent urinary tract infection: no Sexual activity: Not currently sexually active History of sexually transmitted disease: no Penile discharge: no Treatments attempted: Azo, 16 oz of water daily.   Last BM: yesterday; little ball, brown, no blood   Feels that he does not urinate or defecate enough.  Aid   Allergies  Allergen Reactions  . Aspirin Nausea And Vomiting    Tolerates 48m daily    Outpatient Encounter Medications as of 02/11/2020  Medication Sig  . albuterol (PROVENTIL HFA;VENTOLIN HFA) 108 (90 Base) MCG/ACT inhaler Inhale 2 puffs into the lungs every 4 (four) hours as needed for wheezing or shortness of breath.  .Marland Kitchenalbuterol (PROVENTIL) (2.5 MG/3ML) 0.083% nebulizer solution USE 1 VIAL IN NEBULIZER EVERY 6 HOURS AS NEEDED FOR SHORTNESS OF BREATH AND WHEEZING  . amLODipine (NORVASC) 5 MG tablet TAKE (1) TABLET BY MOUTH AT BEDTIME.  .Marland Kitchenaspirin 325 MG tablet Take 325 mg by mouth daily.  .Marland Kitchenaspirin EC 325 MG tablet TAKE 1 TABLET BY MOUTH ONCE A DAY.  .Marland Kitchenatorvastatin (LIPITOR) 40 MG tablet TAKE (1) TABLET BY MOUTH AT  BEDTIME.  .Marland KitchenCholecalciferol (VITAMIN D-3) 25 MCG (1000 UT) CAPS Take 1 capsule (1,000 Units total) by mouth daily.  . ciprofloxacin (CIPRO) 500 MG tablet Take 1 tablet (500 mg total) by mouth 2 (two) times daily.  . clotrimazole-betamethasone (LOTRISONE) cream APPLY 1 APPLICATION TOPICALLY TWICE DAILY.  . fluticasone (FLONASE) 50 MCG/ACT nasal spray USE 1 OR 2 SPRAYS IN EACH NOSTRIL EVERY DAY FOR NASAL ALLERGIES.  .Marland KitchenlevETIRAcetam (KEPPRA) 250 MG tablet TAKE (1) TABLET BY MOUTH EACH MORNING.  .Marland Kitchenlisinopril (ZESTRIL) 10 MG tablet TAKE 1 TABLET BY MOUTH ONCE A DAY FOR HIGH BLOOD PRESSURE. TAKE WITH 20MG TABLET FOR TOTAL DOSE OF 30MG.  .Marland Kitchenlisinopril (ZESTRIL) 20 MG tablet TAKE 1 TABLET BY MOUTH ONCE A DAY. TAKE WITH 10MG TABLET FOR TOTAL DOSE OF 30MG.  . LORazepam (ATIVAN) 1 MG tablet TAKE (1) TABLET BY MOUTH AT BEDTIME.  .Marland Kitchenlubiprostone (AMITIZA) 24 MCG capsule TAKE 1 CAPSULE BY MOUTH TWICE DAILY WITH MEALS.  .Marland KitchenMYRBETRIQ 25 MG TB24 tablet TAKE 1 TABLET BY MOUTH ONCE A DAY.  . naproxen sodium (ALEVE) 220 MG tablet Take 440 mg by mouth daily as needed.  .Marland KitchenNITROSTAT 0.4 MG SL tablet PLACE 1 TAB UNDER TONGUE EVERY 5 MIN IF NEEDED FOR CHEST PAIN. MAY USE 3 TIMES.NO RELIEF CALL 911.  . nystatin (MYCOSTATIN/NYSTOP) powder APPLY TOPCIALLY TO AFFECTED AREAS 4 TIMES DAILY.  .Marland Kitchenomeprazole (PRILOSEC) 20 MG capsule TAKE 1 CAPSULE BY MOUTH ONCE DAILY FOR RELFUX, ESOPHAGITIS, OR STOMACH  ULCERS.  [name] PARoxetine (PAXIL) 40 MG tablet TAKE 1 TABLET BY MOUTH ONCE A DAY.  [name] Respiratory Therapy Supplies (NEBULIZER/TUBING/MOUTHPIECE) KIT Disp one nebulizer machine, tubing set and mouthpiece kit  . thiamine (VITAMIN B-1) 100 MG tablet TAKE 1 TABLET BY MOUTH ONCE A DAY.  [name] thiamine 100 MG tablet TAKE 1 TABLET BY MOUTH ONCE A DAY.  . traZODone (DESYREL) 100 MG tablet TAKE (1) TABLET BY MOUTH AT BEDTIME.  . vitamin B-12 (CYANOCOBALAMIN) 1000 MCG tablet TAKE 1 TABLET BY MOUTH ONCE A DAY.  [name] Zinc Oxide 32.4 % CREA 1 application to  perineum and buttocks with each brief change.   No facility-administered encounter medications on file as of 02/11/2020.    Patient Active Problem List   Diagnosis Date Noted  . Cerebrovascular accident (CVA) due to embolism of left middle cerebral artery ([place]) 12/14/2018  . Abnormal findings on diagnostic imaging of lung 08/19/2018  . IPF (idiopathic pulmonary fibrosis) ([place]) 08/19/2018  . OSA (obstructive sleep apnea) 08/19/2018  . Carotid artery disease ([place]) 03/26/2017  . Internal carotid artery stenosis, left 03/06/2017  . Dysphagia 03/06/2017  . Pulmonary nodules 03/06/2017  . Seizure ([place]) 03/03/2017  . Protein-calorie malnutrition ([place]) 01/10/2017  . Chronic pain 10/02/2016  . Depression, major, single episode, moderate ([place]) 08/31/2016  . Chronic insomnia 08/31/2016  . Lumbar spinal stenosis 08/18/2016  . Wheelchair bound 08/18/2016  . Dementia ([place]) 08/18/2016  . Urinary incontinence 08/18/2016  . Polypharmacy 08/18/2016  . Laryngopharyngeal reflux (LPR) 08/12/2015  . Memory difficulty 08/04/2014  . Numbness and tingling of right arm 08/04/2014  . Abnormality of gait 08/01/2013  . Anemia 12/05/2012  . Early satiety 12/03/2012  . History of colon polyps 12/03/2012  . Facial droop 10/12/2012  . Spinal stenosis of cervical region 07/01/2012  . GERD 05/27/2009  . DYSPHAGIA UNSPECIFIED 05/27/2009  . CHANGE IN BOWELS 05/27/2009  . SMOKER 09/09/2008  . Essential hypertension 09/09/2008  . History of stroke 09/09/2008  . Constipation 09/09/2008  . HIGH BLOOD PRESSURE 07/25/2006    Past Medical History:  Diagnosis Date  . Abnormality of gait 08/01/2013  . Arthritis   . Blindness of left eye    Posttraumatic  . Carotid artery disease ([place])   . Colon polyp   . Constipation   . Diaphragmatic hernia   . Disc disease, degenerative, cervical   . Diverticulitis   . ED (erectile dysfunction)   . Gastroparesis   . GERD (gastroesophageal reflux disease)   . Glaucoma    . Heart disease   . Hyperlipidemia   . Hypertension   . Insomnia   . Memory difficulty 08/04/2014  . Nicotine dependence   . Numbness and tingling of right arm 08/04/2014  . Osteoarthritis   . Overactive bladder   . Radiculopathy   . Shortness of breath    with activity  . Sleep apnea    does not use CPAP.  Tested maybe 5- 10 years ago  . Stroke [facility])    Left side weakness.  . Unilateral inguinal hernia     Relevant past medical, surgical, family and social history reviewed and updated as indicated. Interim medical history since our last visit reviewed.  Review of Systems Per HPI unless specifically indicated above     Objective:    BP 112/60   Pulse 87   Temp (!) 97.2 F (36.2 C)   Ht 5' 9"  (1.753 m)   Wt 162 lb (73.5 kg)   SpO2 96%   BMI  23.92 kg/m   Wt Readings from Last 3 Encounters:  02/11/20 162 lb (73.5 kg)  11/13/17 170 lb (77.1 kg)  10/11/17 170 lb (77.1 kg)    Physical Exam Vitals and nursing note reviewed.  Constitutional:      General: He is not in acute distress.    Appearance: Normal appearance. He is not toxic-appearing.  HENT:     Head: Normocephalic and atraumatic.  Eyes:     General: No scleral icterus.    Extraocular Movements: Extraocular movements intact.  Abdominal:     General: Abdomen is flat. Bowel sounds are normal. There is no distension.     Palpations: Abdomen is soft. There is no mass.     Tenderness: There is no abdominal tenderness. There is no right CVA tenderness, left CVA tenderness or guarding.  Musculoskeletal:     Right lower leg: No edema.     Left lower leg: No edema.     Comments: Patient sitting in wheelchair  Skin:    General: Skin is warm and dry.     Capillary Refill: Capillary refill takes less than 2 seconds.     Coloration: Skin is not jaundiced or pale.     Findings: No erythema.  Neurological:     Mental Status: He is alert. Mental status is at baseline.     Motor: Weakness present.     Gait: Gait  abnormal.  Psychiatric:        Mood and Affect: Mood normal.        Behavior: Behavior normal.        Thought Content: Thought content normal.        Judgment: Judgment normal.        Assessment & Plan:  1. Bacteria in urine Acute, ongoing.  Urine dipstick today showed trace blood, trace protein, nitrite positive, and leukocytes 1+.  Under microscope, many bacteria, calcium oxalate crystals, 6-10 white blood cells, and 0-2 red blood cells.  Unclear if this is a new infection or if previous infection never fully resolved.  Will send urine for culture to ensure still susceptible to cipro and in meantime treat with Ciprofloxacin 500 mg bid for 14 days.  Question prostatitis versus constipation attributing to possible urinary retention and incomplete resolution of UTI.  Courage twice daily use of Amitiza.  Follow-up in 2 weeks either with new PCP or with us-if symptoms persist, may consider referral to urology for ongoing urinary tract infection.  - Urinalysis, Routine w reflex microscopic - ciprofloxacin (CIPRO) 500 MG tablet; Take 1 tablet (500 mg total) by mouth 2 (two) times daily.  Dispense: 28 tablet; Refill: 0 - Urine Culture    Follow up plan: Return in about 2 weeks (around 02/25/2020).

## 2020-02-11 NOTE — Patient Instructions (Signed)
F/u in 2 weeks with new PCP

## 2020-02-13 LAB — URINE CULTURE
MICRO NUMBER:: 11514238
SPECIMEN QUALITY:: ADEQUATE

## 2020-02-17 ENCOUNTER — Telehealth: Payer: Self-pay | Admitting: Pharmacist

## 2020-02-17 DIAGNOSIS — L110 Acquired keratosis follicularis: Secondary | ICD-10-CM | POA: Diagnosis not present

## 2020-02-17 DIAGNOSIS — I739 Peripheral vascular disease, unspecified: Secondary | ICD-10-CM | POA: Diagnosis not present

## 2020-02-17 DIAGNOSIS — B351 Tinea unguium: Secondary | ICD-10-CM | POA: Diagnosis not present

## 2020-02-17 NOTE — Progress Notes (Addendum)
Chronic Care Management Pharmacy Assistant   Name: [name]  MRN: [id number] DOB: [birth year]  Reason for Encounter:General Disease State Call  Patient Questions:  1.  Have you seen any other providers since your last visit? Yes.    2.  Any changes in your medicines or health? Yes.  PCP : Alycia Rossetti, MD   Their chronic conditions include: CAD, GERD,  HTN, Hx of Stroke, Urinary incontinence.  Office Visits: 02/11/20 [name], NP. Bacteria in urine. STARTED Ciprofloxacin 500 mg 2 times daily.  12/23/19 [name]. Follow up. Per note: Patient was concerned about his sleeping medication but no changes were made and had some pain in his feet that [name] felt like it was neuropathy. Urine culture preformed. STARTED Ciprofloxacin 250 mg 2 times daily.   Consults: none since 11/11/19  Urgent Care: 11/18/19 [name] For Cough, Fatigue, Urination difficulty. Labs drawn, COVID Test preformed, Urine test preformed. STARTED (11/24/19) cephalexin (KEFLEX) 500 MG Take 2 capsules (1,000 mg total) by mouth Two (2) times a day for 10 days.  Allergies:   Allergies  Allergen Reactions   Aspirin Nausea And Vomiting    Tolerates 21m daily    Medications: Outpatient Encounter Medications as of 02/17/2020  Medication Sig   albuterol (PROVENTIL HFA;VENTOLIN HFA) 108 (90 Base) MCG/ACT inhaler Inhale 2 puffs into the lungs every 4 (four) hours as needed for wheezing or shortness of breath.   albuterol (PROVENTIL) (2.5 MG/3ML) 0.083% nebulizer solution USE 1 VIAL IN NEBULIZER EVERY 6 HOURS AS NEEDED FOR SHORTNESS OF BREATH AND WHEEZING   amLODipine (NORVASC) 5 MG tablet TAKE (1) TABLET BY MOUTH AT BEDTIME.   aspirin 325 MG tablet Take 325 mg by mouth daily.   aspirin EC 325 MG tablet TAKE 1 TABLET BY MOUTH ONCE A DAY.   atorvastatin (LIPITOR) 40 MG tablet TAKE (1) TABLET BY MOUTH AT BEDTIME.   Cholecalciferol (VITAMIN D-3) 25 MCG (1000 UT) CAPS Take 1 capsule (1,000  Units total) by mouth daily.   ciprofloxacin (CIPRO) 500 MG tablet Take 1 tablet (500 mg total) by mouth 2 (two) times daily.   clotrimazole-betamethasone (LOTRISONE) cream APPLY 1 APPLICATION TOPICALLY TWICE DAILY.   fluticasone (FLONASE) 50 MCG/ACT nasal spray USE 1 OR 2 SPRAYS IN EACH NOSTRIL EVERY DAY FOR NASAL ALLERGIES.   levETIRAcetam (KEPPRA) 250 MG tablet TAKE (1) TABLET BY MOUTH EACH MORNING.   lisinopril (ZESTRIL) 10 MG tablet TAKE 1 TABLET BY MOUTH ONCE A DAY FOR HIGH BLOOD PRESSURE. TAKE WITH 20MG TABLET FOR TOTAL DOSE OF 30MG.   lisinopril (ZESTRIL) 20 MG tablet TAKE 1 TABLET BY MOUTH ONCE A DAY. TAKE WITH 10MG TABLET FOR TOTAL DOSE OF 30MG.   LORazepam (ATIVAN) 1 MG tablet TAKE (1) TABLET BY MOUTH AT BEDTIME.   lubiprostone (AMITIZA) 24 MCG capsule TAKE 1 CAPSULE BY MOUTH TWICE DAILY WITH MEALS.   MYRBETRIQ 25 MG TB24 tablet TAKE 1 TABLET BY MOUTH ONCE A DAY.   naproxen sodium (ALEVE) 220 MG tablet Take 440 mg by mouth daily as needed.   NITROSTAT 0.4 MG SL tablet PLACE 1 TAB UNDER TONGUE EVERY 5 MIN IF NEEDED FOR CHEST PAIN. MAY USE 3 TIMES.NO RELIEF CALL 911.   nystatin (MYCOSTATIN/NYSTOP) powder APPLY TOPCIALLY TO AFFECTED AREAS 4 TIMES DAILY.   omeprazole (PRILOSEC) 20 MG capsule TAKE 1 CAPSULE BY MOUTH ONCE DAILY FOR RELFUX, ESOPHAGITIS, OR STOMACH ULCERS.   PARoxetine (PAXIL) 40 MG tablet TAKE 1 TABLET BY MOUTH ONCE  A DAY.   Respiratory Therapy Supplies (NEBULIZER/TUBING/MOUTHPIECE) KIT Disp one nebulizer machine, tubing set and mouthpiece kit   thiamine (VITAMIN B-1) 100 MG tablet TAKE 1 TABLET BY MOUTH ONCE A DAY.   thiamine 100 MG tablet TAKE 1 TABLET BY MOUTH ONCE A DAY.   traZODone (DESYREL) 100 MG tablet TAKE (1) TABLET BY MOUTH AT BEDTIME.   vitamin B-12 (CYANOCOBALAMIN) 1000 MCG tablet TAKE 1 TABLET BY MOUTH ONCE A DAY.   Zinc Oxide 83.0 % CREA 1 application to perineum and buttocks with each brief change.   No facility-administered encounter medications on file as  of 02/17/2020.    Current Diagnosis: Patient Active Problem List   Diagnosis Date Noted   Cerebrovascular accident (CVA) due to embolism of left middle cerebral artery ([place]) 12/14/2018   Abnormal findings on diagnostic imaging of lung 08/19/2018   IPF (idiopathic pulmonary fibrosis) ([place]) 08/19/2018   OSA (obstructive sleep apnea) 08/19/2018   Carotid artery disease ([place]) 03/26/2017   Internal carotid artery stenosis, left 03/06/2017   Dysphagia 03/06/2017   Pulmonary nodules 03/06/2017   Seizure ([place]) 03/03/2017   Protein-calorie malnutrition ([place]) 01/10/2017   Chronic pain 10/02/2016   Depression, major, single episode, moderate ([place]) 08/31/2016   Chronic insomnia 08/31/2016   Lumbar spinal stenosis 08/18/2016   Wheelchair bound 08/18/2016   Dementia ([place]) 08/18/2016   Urinary incontinence 08/18/2016   Polypharmacy 08/18/2016   Laryngopharyngeal reflux (LPR) 08/12/2015   Memory difficulty 08/04/2014   Numbness and tingling of right arm 08/04/2014   Abnormality of gait 08/01/2013   Anemia 12/05/2012   Early satiety 12/03/2012   History of colon polyps 12/03/2012   Facial droop 10/12/2012   Spinal stenosis of cervical region 07/01/2012   GERD 05/27/2009   DYSPHAGIA UNSPECIFIED 05/27/2009   CHANGE IN BOWELS 05/27/2009   SMOKER 09/09/2008   Essential hypertension 09/09/2008   History of stroke 09/09/2008   Constipation 09/09/2008   HIGH BLOOD PRESSURE 07/25/2006    Goals Addressed   None    GEN Call: Spoke with the patient and he gave me the okay to speak to his aide. Patients aide stated he eats twice a day and snacks through out the day, and she gives him a boost drink if he doesn't eat well that day. Patients aid stated he does ROM exercises twice a week in his chair. The aide stated he drinks about 2 cups of flavored water and a small 8 oz at night. Per aide he does not have any concerns getting any of his medication. The aide stated she checks his blood pressure daily  and its's been within the normal range.   Follow-Up:  Pharmacist Review   [name], RMA Clinical Pharmacist Assistant [phone]  3 minutes spent in review, coordination, and documentation.  Reviewed by: Beverly Milch, PharmD Clinical Pharmacist [facility] [phone]

## 2020-02-24 DIAGNOSIS — J84112 Idiopathic pulmonary fibrosis: Secondary | ICD-10-CM | POA: Diagnosis not present

## 2020-02-24 DIAGNOSIS — J449 Chronic obstructive pulmonary disease, unspecified: Secondary | ICD-10-CM | POA: Diagnosis not present

## 2020-03-02 ENCOUNTER — Other Ambulatory Visit: Payer: Self-pay

## 2020-03-02 ENCOUNTER — Ambulatory Visit (INDEPENDENT_AMBULATORY_CARE_PROVIDER_SITE_OTHER): Payer: Medicare Other | Admitting: Nurse Practitioner

## 2020-03-02 ENCOUNTER — Encounter: Payer: Self-pay | Admitting: Nurse Practitioner

## 2020-03-02 VITALS — BP 131/69 | HR 82 | Temp 97.8°F | Ht 69.0 in

## 2020-03-02 DIAGNOSIS — K219 Gastro-esophageal reflux disease without esophagitis: Secondary | ICD-10-CM | POA: Diagnosis not present

## 2020-03-02 DIAGNOSIS — I10 Essential (primary) hypertension: Secondary | ICD-10-CM | POA: Diagnosis not present

## 2020-03-02 DIAGNOSIS — F321 Major depressive disorder, single episode, moderate: Secondary | ICD-10-CM

## 2020-03-02 NOTE — Assessment & Plan Note (Signed)
Hypertension well controlled on current medication.  Amlodipine 5 mg tablet by mouth at bedtime, lisinopril 20 mg tablet by mouth once a day.  Patient takes with 10 mg tablet for total dose of 30 mg tablets daily.  Continue low-sodium diet and exercise as tolerated.  Follow-up with uncontrolled blood pressure.

## 2020-03-02 NOTE — Progress Notes (Signed)
New Patient Note  RE: [name] MRN: [id number] DOB: November 26, 1934 Date of Office Visit: 03/02/2020  Chief Complaint: Establish Care, Hypertension, and Gastroesophageal Reflux  History of Present Illness:  Pt presents for follow up of hypertension. Patient was diagnosed in 09/09/2008. The patient is tolerating the medication well without side effects. Compliance with treatment has been good; including taking medication as directed , maintains a healthy diet and regular exercise regimen , and following up as directed. Current medication lisinopril 20 mg tablet and amlodipine 5 mg tablet by mouth at bedtime.  GERD, Follow up:  The patient was last seen for GERD 3 years ago. Changes made since that visit include none.  He reports good compliance with treatment. He is not having side effects. [name]  He IS experiencing no new symptoms. He is NOT experiencing abdominal bloating, belching and eructation, bilious reflux, chest pain, choking on food, cough or deep pressure at base of neck  ----------------------------------------------------------------------------------------- Depression, Follow-up  He  was last seen for this 2 years ago. Changes made at last visit include no new changes.   He reports good compliance with treatment. He is not having side effects.   He reports good tolerance of treatment. Current symptoms include: none He feels he is Improved since last visit.  Depression screen [facility] 2/9 03/02/2020 08/20/2019 12/30/2018  Decreased Interest 0 2 2  Down, Depressed, Hopeless 0 2 2  PHQ - 2 Score 0 4 4  Altered sleeping - 2 2  Tired, decreased energy - 2 2  Change in appetite - 2 2  Feeling bad or failure about yourself  - 2 2  Trouble concentrating - 2 1  Moving slowly or fidgety/restless - 2 1  Suicidal thoughts - 0 0  PHQ-9 Score - 16 14  Difficult doing work/chores - Very difficult Very difficult  Some recent data might be hidden     -----------------------------------------------------------------------------------------   Assessment and Plan: [name] is a 86 y.o. male with: Essential hypertension Hypertension well controlled on current medication.  Amlodipine 5 mg tablet by mouth at bedtime, lisinopril 20 mg tablet by mouth once a day.  Patient takes with 10 mg tablet for total dose of 30 mg tablets daily.  Continue low-sodium diet and exercise as tolerated.  Follow-up with uncontrolled blood pressure.     GERD No new symptoms of GERD.  Patient is well managed on omeprazole 20 mg tablet by mouth once daily.  Continue healthy diet.  Depression, major, single episode, moderate (HCC) Depression symptoms well managed on current medication completed PHQ-9.  Current medication Paxil 40 mg 1 tablet by mouth once daily.  Return in about 8 weeks (around 04/27/2020).   Diagnostics:   Past Medical History: Patient Active Problem List   Diagnosis Date Noted  . Cerebrovascular accident (CVA) due to embolism of left middle cerebral artery ([place]) 12/14/2018  . Abnormal findings on diagnostic imaging of lung 08/19/2018  . IPF (idiopathic pulmonary fibrosis) ([place]) 08/19/2018  . OSA (obstructive sleep apnea) 08/19/2018  . Carotid artery disease ([place]) 03/26/2017  . Internal carotid artery stenosis, left 03/06/2017  . Dysphagia 03/06/2017  . Pulmonary nodules 03/06/2017  . Seizure ([place]) 03/03/2017  . Protein-calorie malnutrition ([place]) 01/10/2017  . Chronic pain 10/02/2016  . Depression, major, single episode, moderate ([place]) 08/31/2016  . Chronic insomnia 08/31/2016  . Lumbar spinal stenosis 08/18/2016  . Wheelchair bound 08/18/2016  . Dementia ([place]) 08/18/2016  . Urinary incontinence 08/18/2016  . Polypharmacy 08/18/2016  . Laryngopharyngeal reflux (LPR) 08/12/2015  .  Memory difficulty 08/04/2014  . Numbness and tingling of right arm 08/04/2014  . Abnormality of gait 08/01/2013  . Anemia 12/05/2012  . Early satiety  12/03/2012  . History of colon polyps 12/03/2012  . Facial droop 10/12/2012  . Spinal stenosis of cervical region 07/01/2012  . GERD 05/27/2009  . DYSPHAGIA UNSPECIFIED 05/27/2009  . CHANGE IN BOWELS 05/27/2009  . SMOKER 09/09/2008  . Essential hypertension 09/09/2008  . History of stroke 09/09/2008  . Constipation 09/09/2008  . HIGH BLOOD PRESSURE 07/25/2006   Past Medical History:  Diagnosis Date  . Abnormality of gait 08/01/2013  . Arthritis   . Blindness of left eye    Posttraumatic  . Carotid artery disease ([place])   . Colon polyp   . Constipation   . Diaphragmatic hernia   . Disc disease, degenerative, cervical   . Diverticulitis   . ED (erectile dysfunction)   . Gastroparesis   . GERD (gastroesophageal reflux disease)   . Glaucoma   . Heart disease   . Hyperlipidemia   . Hypertension   . Insomnia   . Memory difficulty 08/04/2014  . Nicotine dependence   . Numbness and tingling of right arm 08/04/2014  . Osteoarthritis   . Overactive bladder   . Radiculopathy   . Shortness of breath    with activity  . Sleep apnea    does not use CPAP.  Tested maybe 5- 10 years ago  . Stroke [facility])    Left side weakness.  . Unilateral inguinal hernia    Past Surgical History: Past Surgical History:  Procedure Laterality Date  . ANTERIOR CERVICAL DECOMP/DISCECTOMY FUSION N/A 07/01/2012   Procedure: ANTERIOR CERVICAL DECOMPRESSION/DISCECTOMY FUSION CERVICAL FIVE-SIX [name] REMOVAL CERVICAL SIX-SEVEN;  Surgeon: [name], MD;  Location: [place] [facility]URO ORS;  Service: Neurosurgery;  Laterality: N/A;  . CERVICAL FUSION    . COLONOSCOPY   08/04/2002     RMR: Normal rectum/Pancolonic diverticula/Colonic polyps as described above, biopsied and/or snared  . COLONOSCOPY  60/08/2009   RMR: normal rectum/left and right sided diverticula/multiple colonic polyps. tubular adenomas. surveillance due 2014  . COLONOSCOPY WITH ESOPHAGOGASTRODUODENOSCOPY (EGD) N/A 12/18/2012   Dr. [name]:  Colonic  diverticulosis. Multiple colonic polyps-hyperplastic polyps and tubular adenoma. Friable anal canal hemorrhoids. EGD with chronic atrophic gastritis  . ESOPHAGOGASTRODUODENOSCOPY  04/14/2002   CVE:[id number]'[id number]' ring, status post dilation as described above/Hiatal hernia, focal antral erosions of uncertain clinical significance  . ESOPHAGOGASTRODUODENOSCOPY  06/09/2009   RMR: normal esophagus s/p dilator/small hiatal hernia otherwise normal  . HERNIA REPAIR Right    inguinal  . I & D EXTREMITY Right 04/23/2017   Procedure: [place] HAND;  Surgeon: [name], MD;  Location: [facility];  Service: Orthopedics;  Laterality: Right;  . INGUINAL HERNIA REPAIR Left 04/15/2014   Procedure: LEFT INGUINAL HERNIORRHAPHY WITH MESH;  Surgeon: [name], MD;  Location: [facility];  Service: General;  Laterality: Left;  . INSERTION OF MESH Left 04/15/2014   Procedure: INSERTION OF MESH;  Surgeon: [name], MD;  Location: [facility];  Service: General;  Laterality: Left;   Medication List:  Current Outpatient Medications  Medication Sig Dispense Refill  . albuterol (PROVENTIL HFA;VENTOLIN HFA) 108 (90 Base) MCG/ACT inhaler Inhale 2 puffs into the lungs every 4 (four) hours as needed for wheezing or shortness of breath. 1 Inhaler 0  . albuterol (PROVENTIL) (2.5 MG/3ML) 0.083% nebulizer solution USE 1 VIAL IN NEBULIZER EVERY 6 HOURS AS NEEDED FOR SHORTNESS OF BREATH AND  WHEEZING 150 mL 5  . amLODipine (NORVASC) 5 MG tablet TAKE (1) TABLET BY MOUTH AT BEDTIME. 30 tablet 0  . aspirin 325 MG tablet Take 325 mg by mouth daily.    [name] aspirin EC 325 MG tablet TAKE 1 TABLET BY MOUTH ONCE A DAY. 30 tablet 0  . atorvastatin (LIPITOR) 40 MG tablet TAKE (1) TABLET BY MOUTH AT BEDTIME. 30 tablet 3  . Cholecalciferol (VITAMIN D-3) 25 MCG (1000 UT) CAPS Take 1 capsule (1,000 Units total) by mouth daily. 30 capsule 6  . ciprofloxacin (CIPRO) 500 MG tablet Take 1 tablet (500 mg total) by mouth 2 (two) times  daily. 28 tablet 0  . clotrimazole-betamethasone (LOTRISONE) cream APPLY 1 APPLICATION TOPICALLY TWICE DAILY. 60 g 0  . fluticasone (FLONASE) 50 MCG/ACT nasal spray USE 1 OR 2 SPRAYS IN EACH NOSTRIL EVERY DAY FOR NASAL ALLERGIES. 16 g 3  . levETIRAcetam (KEPPRA) 250 MG tablet TAKE (1) TABLET BY MOUTH EACH MORNING. 30 tablet 3  . lisinopril (ZESTRIL) 10 MG tablet TAKE 1 TABLET BY MOUTH ONCE A DAY FOR HIGH BLOOD PRESSURE. TAKE WITH 20MG TABLET FOR TOTAL DOSE OF 30MG. 30 tablet 3  . lisinopril (ZESTRIL) 20 MG tablet TAKE 1 TABLET BY MOUTH ONCE A DAY. TAKE WITH 10MG TABLET FOR TOTAL DOSE OF 30MG. 30 tablet 3  . LORazepam (ATIVAN) 1 MG tablet TAKE (1) TABLET BY MOUTH AT BEDTIME. 30 tablet 3  . lubiprostone (AMITIZA) 24 MCG capsule TAKE 1 CAPSULE BY MOUTH TWICE DAILY WITH MEALS. 60 capsule 6  . MYRBETRIQ 25 MG TB24 tablet TAKE 1 TABLET BY MOUTH ONCE A DAY. 30 tablet 3  . naproxen sodium (ALEVE) 220 MG tablet Take 440 mg by mouth daily as needed.    [name] NITROSTAT 0.4 MG SL tablet PLACE 1 TAB UNDER TONGUE EVERY 5 MIN IF NEEDED FOR CHEST PAIN. MAY USE 3 TIMES.NO RELIEF CALL 911. 25 tablet 0  . nystatin (MYCOSTATIN/NYSTOP) powder APPLY TOPCIALLY TO AFFECTED AREAS 4 TIMES DAILY. 60 g 5  . omeprazole (PRILOSEC) 20 MG capsule TAKE 1 CAPSULE BY MOUTH ONCE DAILY FOR RELFUX, ESOPHAGITIS, OR STOMACH ULCERS. 30 capsule 3  . PARoxetine (PAXIL) 40 MG tablet TAKE 1 TABLET BY MOUTH ONCE A DAY. 30 tablet 0  . Respiratory Therapy Supplies (NEBULIZER/TUBING/MOUTHPIECE) KIT Disp one nebulizer machine, tubing set and mouthpiece kit 1 each 0  . thiamine (VITAMIN B-1) 100 MG tablet TAKE 1 TABLET BY MOUTH ONCE A DAY. 30 tablet 0  . thiamine 100 MG tablet TAKE 1 TABLET BY MOUTH ONCE A DAY. 30 tablet 6  . traZODone (DESYREL) 100 MG tablet TAKE (1) TABLET BY MOUTH AT BEDTIME. 30 tablet 3  . vitamin B-12 (CYANOCOBALAMIN) 1000 MCG tablet TAKE 1 TABLET BY MOUTH ONCE A DAY. 30 tablet 3  . Zinc Oxide 79.0 % CREA 1 application to  perineum and buttocks with each brief change. 1000 g 3   No current facility-administered medications for this visit.   Allergies: No Known Allergies Social History: Social History   Socioeconomic History  . Marital status: Married    Spouse name: Not on file  . Number of children: 2  . Years of education: hs  . Highest education level: Not on file  Occupational History  . Occupation: Retired    Fish farm manager: RETIRED  Tobacco Use  . Smoking status: Former Smoker    Packs/day: 0.50    Years: 60.00    Pack years: 30.00    Types: Cigarettes  . Smokeless  tobacco: Never Used  . Tobacco comment: using patch  Substance and Sexual Activity  . Alcohol use: No  . Drug use: No  . Sexual activity: Not Currently  Other Topics Concern  . Not on file  Social History Narrative   Patient drinks 1-2 cups of caffeine daily.   Patient is right handed.   Social Determinants of Health   Financial Resource Strain: Low Risk   . Difficulty of Paying Living Expenses: Not very hard  Food Insecurity: Not on file  Transportation Needs: Not on file  Physical Activity: Not on file  Stress: Not on file  Social Connections: Not on file       Family History: Family History  Problem Relation Age of Onset  . Cancer Mother   . Dementia Father   . Colon cancer Neg Hx          Review of Systems Objective: BP 131/69   Pulse 82   Temp 97.8 F (36.6 C)   Ht 5' 9" (1.753 m)   SpO2 98%   BMI 23.92 kg/m  Body mass index is 23.92 kg/m. Physical Exam The plan was reviewed with the patient/family, and all questions/concerned were addressed.  It was my pleasure to see [place] today and participate in his care. Please feel free to contact me with any questions or concerns.  Sincerely,  Jac Canavan NP [place]

## 2020-03-02 NOTE — Assessment & Plan Note (Signed)
No new symptoms of GERD.  Patient is well managed on omeprazole 20 mg tablet by mouth once daily.  Continue healthy diet.

## 2020-03-02 NOTE — Assessment & Plan Note (Signed)
Depression symptoms well managed on current medication completed PHQ-9.  Current medication Paxil 40 mg 1 tablet by mouth once daily.

## 2020-03-02 NOTE — Patient Instructions (Signed)
Hypertension, Adult Hypertension is another name for high blood pressure. High blood pressure forces your heart to work harder to pump blood. This can cause problems over time. There are two numbers in a blood pressure reading. There is a top number (systolic) over a bottom number (diastolic). It is best to have a blood pressure that is below 120/80. Healthy choices can help lower your blood pressure, or you may need medicine to help lower it. What are the causes? The cause of this condition is not known. Some conditions may be related to high blood pressure. What increases the risk?  Smoking.  Having type 2 diabetes mellitus, high cholesterol, or both.  Not getting enough exercise or physical activity.  Being overweight.  Having too much fat, sugar, calories, or salt (sodium) in your diet.  Drinking too much alcohol.  Having long-term (chronic) kidney disease.  Having a family history of high blood pressure.  Age. Risk increases with age.  Race. You may be at higher risk if you are African American.  Gender. Men are at higher risk than women before age 45. After age 65, women are at higher risk than men.  Having obstructive sleep apnea.  Stress. What are the signs or symptoms?  High blood pressure may not cause symptoms. Very high blood pressure (hypertensive crisis) may cause: ? Headache. ? Feelings of worry or nervousness (anxiety). ? Shortness of breath. ? Nosebleed. ? A feeling of being sick to your stomach (nausea). ? Throwing up (vomiting). ? Changes in how you see. ? Very bad chest pain. ? Seizures. How is this treated?  This condition is treated by making healthy lifestyle changes, such as: ? Eating healthy foods. ? Exercising more. ? Drinking less alcohol.  Your health care provider may prescribe medicine if lifestyle changes are not enough to get your blood pressure under control, and if: ? Your top number is above 130. ? Your bottom number is above  80.  Your personal target blood pressure may vary. Follow these instructions at home: Eating and drinking  If told, follow the DASH eating plan. To follow this plan: ? Fill one half of your plate at each meal with fruits and vegetables. ? Fill one fourth of your plate at each meal with whole grains. Whole grains include whole-wheat pasta, brown rice, and whole-grain bread. ? Eat or drink low-fat dairy products, such as skim milk or low-fat yogurt. ? Fill one fourth of your plate at each meal with low-fat (lean) proteins. Low-fat proteins include fish, chicken without skin, eggs, beans, and tofu. ? Avoid fatty meat, cured and processed meat, or chicken with skin. ? Avoid pre-made or processed food.  Eat less than 1,500 mg of salt each day.  Do not drink alcohol if: ? Your doctor tells you not to drink. ? You are pregnant, may be pregnant, or are planning to become pregnant.  If you drink alcohol: ? Limit how much you use to:  0-1 drink a day for women.  0-2 drinks a day for men. ? Be aware of how much alcohol is in your drink. In the [place] one drink equals one 12 oz bottle of beer (355 mL), one 5 oz glass of wine (148 mL), or one 1 oz glass of hard liquor (44 mL).   Lifestyle  Work with your doctor to stay at a healthy weight or to lose weight. Ask your doctor what the best weight is for you.  Get at least 30 minutes of exercise most   days of the week. This may include walking, swimming, or biking.  Get at least 30 minutes of exercise that strengthens your muscles (resistance exercise) at least 3 days a week. This may include lifting weights or doing Pilates.  Do not use any products that contain nicotine or tobacco, such as cigarettes, e-cigarettes, and chewing tobacco. If you need help quitting, ask your doctor.  Check your blood pressure at home as told by your doctor.  Keep all follow-up visits as told by your doctor. This is important.   Medicines  Take over-the-counter  and prescription medicines only as told by your doctor. Follow directions carefully.  Do not skip doses of blood pressure medicine. The medicine does not work as well if you skip doses. Skipping doses also puts you at risk for problems.  Ask your doctor about side effects or reactions to medicines that you should watch for. Contact a doctor if you:  Think you are having a reaction to the medicine you are taking.  Have headaches that keep coming back (recurring).  Feel dizzy.  Have swelling in your ankles.  Have trouble with your vision. Get help right away if you:  Get a very bad headache.  Start to feel mixed up (confused).  Feel weak or numb.  Feel faint.  Have very bad pain in your: ? Chest. ? Belly (abdomen).  Throw up more than once.  Have trouble breathing. Summary  Hypertension is another name for high blood pressure.  High blood pressure forces your heart to work harder to pump blood.  For most people, a normal blood pressure is less than 120/80.  Making healthy choices can help lower blood pressure. If your blood pressure does not get lower with healthy choices, you may need to take medicine. This information is not intended to replace advice given to you by your health care provider. Make sure you discuss any questions you have with your health care provider. Document Revised: 08/29/2017 Document Reviewed: 08/29/2017 Elsevier Patient Education  [place]. [employer]'s Current Therapy 2021 (pp. 213-216). [place], [place]: Elsevier.">  Gastroesophageal Reflux Disease, Adult Gastroesophageal reflux (GER) happens when acid from the stomach flows up into the tube that connects the mouth and the stomach (esophagus). Normally, food travels down the esophagus and stays in the stomach to be digested. However, when a person has GER, food and stomach acid sometimes move back up into the esophagus. If this becomes a more serious problem, the person may be diagnosed  with a disease called gastroesophageal reflux disease (GERD). GERD occurs when the reflux:  Happens often.  Causes frequent or severe symptoms.  Causes problems such as damage to the esophagus. When stomach acid comes in contact with the esophagus, the acid may cause inflammation in the esophagus. Over time, GERD may create small holes (ulcers) in the lining of the esophagus. What are the causes? This condition is caused by a problem with the muscle between the esophagus and the stomach (lower esophageal sphincter, or LES). Normally, the LES muscle closes after food passes through the esophagus to the stomach. When the LES is weakened or abnormal, it does not close properly, and that allows food and stomach acid to go back up into the esophagus. The LES can be weakened by certain dietary substances, medicines, and medical conditions, including:  Tobacco use.  Pregnancy.  Having a hiatal hernia.  Alcohol use.  Certain foods and beverages, such as coffee, chocolate, onions, and peppermint. What increases the risk? You are more  likely to develop this condition if you:  Have an increased body weight.  Have a connective tissue disorder.  Take NSAIDs, such as ibuprofen. What are the signs or symptoms? Symptoms of this condition include:  Heartburn.  Difficult or painful swallowing and the feeling of having a lump in the throat.  A bitter taste in the mouth.  Bad breath and having a large amount of saliva.  Having an upset or bloated stomach and belching.  Chest pain. Different conditions can cause chest pain. Make sure you see your health care provider if you experience chest pain.  Shortness of breath or wheezing.  Ongoing (chronic) cough or a nighttime cough.  Wearing away of tooth enamel.  Weight loss. How is this diagnosed? This condition may be diagnosed based on a medical history and a physical exam. To determine if you have mild or severe GERD, your health care  provider may also monitor how you respond to treatment. You may also have tests, including:  A test to examine your stomach and esophagus with a small camera (endoscopy).  A test that measures the acidity level in your esophagus.  A test that measures how much pressure is on your esophagus.  A barium swallow or modified barium swallow test to show the shape, size, and functioning of your esophagus. How is this treated? Treatment for this condition may vary depending on how severe your symptoms are. Your health care provider may recommend:  Changes to your diet.  Medicine.  Surgery. The goal of treatment is to help relieve your symptoms and to prevent complications. Follow these instructions at home: Eating and drinking  Follow a diet as recommended by your health care provider. This may involve avoiding foods and drinks such as: ? Coffee and tea, with or without caffeine. ? Drinks that contain alcohol. ? Energy drinks and sports drinks. ? Carbonated drinks or sodas. ? Chocolate and cocoa. ? Peppermint and mint flavorings. ? Garlic and onions. ? Horseradish. ? Spicy and acidic foods, including peppers, chili powder, curry powder, vinegar, hot sauces, and barbecue sauce. ? Citrus fruit juices and citrus fruits, such as oranges, lemons, and limes. ? Tomato-based foods, such as red sauce, chili, salsa, and pizza with red sauce. ? Fried and fatty foods, such as donuts, french fries, potato chips, and high-fat dressings. ? High-fat meats, such as hot dogs and fatty cuts of red and white meats, such as rib eye steak, sausage, ham, and bacon. ? High-fat dairy items, such as whole milk, butter, and cream cheese.  Eat small, frequent meals instead of large meals.  Avoid drinking large amounts of liquid with your meals.  Avoid eating meals during the 2-3 hours before bedtime.  Avoid lying down right after you eat.  Do not exercise right after you eat.   Lifestyle  Do not use any  products that contain nicotine or tobacco. These products include cigarettes, chewing tobacco, and vaping devices, such as e-cigarettes. If you need help quitting, ask your health care provider.  Try to reduce your stress by using methods such as yoga or meditation. If you need help reducing stress, ask your health care provider.  If you are overweight, reduce your weight to an amount that is healthy for you. Ask your health care provider for guidance about a safe weight loss goal.   General instructions  Pay attention to any changes in your symptoms.  Take over-the-counter and prescription medicines only as told by your health care provider. Do not take  aspirin, ibuprofen, or other NSAIDs unless your health care provider told you to take these medicines.  Wear loose-fitting clothing. Do not wear anything tight around your waist that causes pressure on your abdomen.  Raise (elevate) the head of your bed about 6 inches (15 cm). You can use a wedge to do this.  Avoid bending over if this makes your symptoms worse.  Keep all follow-up visits. This is important. Contact a health care provider if:  You have: ? New symptoms. ? Unexplained weight loss. ? Difficulty swallowing or it hurts to swallow. ? Wheezing or a persistent cough. ? A hoarse voice.  Your symptoms do not improve with treatment. Get help right away if:  You have sudden pain in your arms, neck, jaw, teeth, or back.  You suddenly feel sweaty, dizzy, or light-headed.  You have chest pain or shortness of breath.  You vomit and the vomit is green, yellow, or black, or it looks like blood or coffee grounds.  You faint.  You have stool that is red, bloody, or black.  You cannot swallow, drink, or eat. These symptoms may represent a serious problem that is an emergency. Do not wait to see if the symptoms will go away. Get medical help right away. Call your local emergency services (911 in the [place] Do not drive yourself  to the hospital. Summary  Gastroesophageal reflux happens when acid from the stomach flows up into the esophagus. GERD is a disease in which the reflux happens often, causes frequent or severe symptoms, or causes problems such as damage to the esophagus.  Treatment for this condition may vary depending on how severe your symptoms are. Your health care provider may recommend diet and lifestyle changes, medicine, or surgery.  Contact a health care provider if you have new or worsening symptoms.  Take over-the-counter and prescription medicines only as told by your health care provider. Do not take aspirin, ibuprofen, or other NSAIDs unless your health care provider told you to do so.  Keep all follow-up visits as told by your health care provider. This is important. This information is not intended to replace advice given to you by your health care provider. Make sure you discuss any questions you have with your health care provider. Document Revised: 06/30/2019 Document Reviewed: 06/30/2019 Elsevier Patient Education  [place].

## 2020-03-08 ENCOUNTER — Telehealth: Payer: Self-pay

## 2020-03-08 DIAGNOSIS — J449 Chronic obstructive pulmonary disease, unspecified: Secondary | ICD-10-CM | POA: Diagnosis not present

## 2020-03-08 DIAGNOSIS — I10 Essential (primary) hypertension: Secondary | ICD-10-CM | POA: Diagnosis not present

## 2020-03-08 DIAGNOSIS — M4800 Spinal stenosis, site unspecified: Secondary | ICD-10-CM | POA: Diagnosis not present

## 2020-03-08 NOTE — Telephone Encounter (Signed)
Caretaker is asking what bedtime medication you wanted to discontinue. I do not see any discontinued meds in last note. Aware you are out of town until next week.

## 2020-03-14 NOTE — Telephone Encounter (Signed)
I was a little concerned that Patient was on ativan and trazodone at the same time for sleep. I wanted to taper patient off ativan or trazadon depending which was more effective for his sleep, but haven't decided since I had just met patient and wanted to see how he would do without it,  a phone visit will help me discause further with patients caregiver.

## 2020-03-15 ENCOUNTER — Ambulatory Visit (INDEPENDENT_AMBULATORY_CARE_PROVIDER_SITE_OTHER): Payer: Medicare Other | Admitting: Nurse Practitioner

## 2020-03-15 ENCOUNTER — Encounter: Payer: Self-pay | Admitting: Nurse Practitioner

## 2020-03-15 DIAGNOSIS — F5104 Psychophysiologic insomnia: Secondary | ICD-10-CM | POA: Diagnosis not present

## 2020-03-15 NOTE — Patient Instructions (Signed)

## 2020-03-15 NOTE — Telephone Encounter (Signed)
Appointment scheduled,

## 2020-03-15 NOTE — Progress Notes (Signed)
   Virtual Visit via telephone Note Due to COVID-19 pandemic this visit was conducted virtually. This visit type was conducted due to national recommendations for restrictions regarding the COVID-19 Pandemic (e.g. social distancing, sheltering in place) in an effort to limit this patient's exposure and mitigate transmission in our community. All issues noted in this document were discussed and addressed.  A physical exam was not performed with this format.  I connected with [name] on 03/15/20 at 9:13 AM by telephone and verified that I am speaking with the correct person using two identifiers. Kenon Delashmit is currently located at home and caregiver [name] is currently with patient during visit. The provider, Ivy Lynn, NP is located in their office at time of visit.  I discussed the limitations, risks, security and privacy concerns of performing an evaluation and management service by telephone and the availability of in person appointments. I also discussed with the patient that there may be a patient responsible charge related to this service. The patient expressed understanding and agreed to proceed.   History and Present Illness:  Insomnia Primary symptoms: sleep disturbance.  The current episode started more than one year. The onset quality is undetermined. The problem occurs nightly. The problem has been gradually improving since onset. The symptoms are aggravated by anxiety. The symptoms are relieved by medication. Past treatments include meditation. The treatment provided significant relief. Typical bedtime:  Other.  How long after going to bed to you fall asleep: 30-60 minutes.   Duration of naps:  One to two hours.       Review of Systems  Constitutional: Negative.   HENT: Negative.   Respiratory: Negative.   Cardiovascular: Negative.   Gastrointestinal: Negative.   Genitourinary: Negative.   Musculoskeletal: Negative.   Psychiatric/Behavioral: Positive for sleep  disturbance. The patient has insomnia.   All other systems reviewed and are negative.    Observations/Objective:   Televisit-patient does not sound to be in distress.  Assessment and Plan:    Chronic insomnia Patient's insomnia is gradually improving but not completely resolved.  This is a chronic issue.  Conversation with [name] caregiver today was to discuss patient's trazodone and benzo and safety of medication administration.  Caregiver is not reporting any adverse side effect from medication and reports patient has been on current dose for the last 2 years.  [name] verbalized understanding patient will continue trazodone and 1 mg Ativan as prescribed by Dr. Vic Blackbird.-To follow-up with worsening or unresolved symptoms.     Follow Up Instructions: Follow-up with worsening unresolved symptoms.    I discussed the assessment and treatment plan with the patient. The patient was provided an opportunity to ask questions and all were answered. The patient agreed with the plan and demonstrated an understanding of the instructions.   The patient was advised to call back or seek an in-person evaluation if the symptoms worsen or if the condition fails to improve as anticipated.  The above assessment and management plan was discussed with the patient. The patient verbalized understanding of and has agreed to the management plan. Patient is aware to call the clinic if symptoms persist or worsen. Patient is aware when to return to the clinic for a follow-up visit. Patient educated on when it is appropriate to go to the emergency department.   Time call ended:  09:14 Am  I provided 11 minutes of non-face-to-face time during this encounter.    Ivy Lynn, NP

## 2020-03-15 NOTE — Assessment & Plan Note (Signed)
Patient's insomnia is gradually improving but not completely resolved.  This is a chronic issue.  Conversation with [name] caregiver today was to discuss patient's trazodone and benzo and safety of medication administration.  Caregiver is not reporting any adverse side effect from medication and reports patient has been on current dose for the last 2 years.  [name] verbalized understanding patient will continue trazodone and 1 mg Ativan as prescribed by Dr. Vic Blackbird.-To follow-up with worsening or unresolved symptoms.

## 2020-03-16 NOTE — Progress Notes (Deleted)
Chronic Care Management Pharmacy Note  03/16/2020 Name:  [name] MRN:  [id number] DOB:  [birth year]  Subjective: [name] is an 86 y.o. year old male who is a primary patient of Ivy Lynn, NP.  The CCM team was consulted for assistance with disease management and care coordination needs.    Engaged with patient by telephone for follow up visit in response to provider referral for pharmacy case management and/or care coordination services.   Consent to Services:  The patient was given the following information about Chronic Care Management services today, agreed to services, and gave verbal consent: 1. CCM service includes personalized support from designated clinical staff supervised by the primary care provider, including individualized plan of care and coordination with other care providers 2. 24/7 contact phone numbers for assistance for urgent and routine care needs. 3. Service will only be billed when office clinical staff spend 20 minutes or more in a month to coordinate care. 4. Only one practitioner may furnish and bill the service in a calendar month. 5.The patient may stop CCM services at any time (effective at the end of the month) by phone call to the office staff. 6. The patient will be responsible for cost sharing (co-pay) of up to 20% of the service fee (after annual deductible is met). Patient agreed to services and consent obtained.  Patient Care Team: Ivy Lynn, NP as PCP - General (Nurse Practitioner) Daneil Dolin, MD as Consulting Physician (Gastroenterology) Edythe Clarity, [facility] as Pharmacist (Pharmacist)  Recent office visits: ***  Recent consult visits: [facility] visits: {Hospital DC Yes/No:25215}  Objective:  Lab Results  Component Value Date   CREATININE 0.72 12/23/2019   BUN 15 12/23/2019   GFRNONAA 78 10/04/2017   GFRAA 90 10/04/2017   NA 141 12/23/2019   K 4.6 12/23/2019   CALCIUM 9.3 12/23/2019   CO2 27 12/23/2019     Lab Results  Component Value Date/Time   HGBA1C 5.9 (H) 12/23/2019 11:04 AM   HGBA1C 5.4 04/23/2017 05:09 AM    Last diabetic Eye exam: No results found for: HMDIABEYEEXA  Last diabetic Foot exam: No results found for: HMDIABFOOTEX   Lab Results  Component Value Date   CHOL 118 08/20/2019   HDL 59 08/20/2019   LDLCALC 46 08/20/2019   TRIG 58 08/20/2019   CHOLHDL 2.0 08/20/2019    Hepatic Function Latest Ref Rng & Units 12/23/2019 08/20/2019 12/30/2018  Total Protein 6.1 - 8.1 g/dL 6.8 6.9 7.4  Albumin 3.5 - 5.0 g/dL - - -  AST 10 - 35 U/L 16 12 14   ALT 9 - 46 U/L 7(L) 6(L) 7(L)  Alk Phosphatase 38 - 126 U/L - - -  Total Bilirubin 0.2 - 1.2 mg/dL 0.5 0.7 0.4    Lab Results  Component Value Date/Time   TSH 1.53 11/20/2018 11:07 AM   TSH 0.653 03/03/2017 11:49 PM   TSH 1.450 08/01/2013 03:06 PM    CBC Latest Ref Rng & Units 12/23/2019 08/20/2019 12/30/2018  WBC 3.8 - 10.8 Thousand/uL 6.2 5.9 8.7  Hemoglobin 13.2 - 17.1 g/dL 13.0(L) 14.1 14.7  Hematocrit 38.5 - 50.0 % 39.3 41.9 43.7  Platelets 140 - 400 Thousand/uL 191 205 230    No results found for: VD25OH  Clinical ASCVD: {YES/NO:21197} The ASCVD Risk score Mikey Bussing DC Jr., et al., 2013) failed to calculate for the following reasons:   The 2013 ASCVD risk score is only valid for ages 84 to 42  The patient has a prior MI or stroke diagnosis    Depression screen [facility] 2/9 03/02/2020 08/20/2019 12/30/2018  Decreased Interest 0 2 2  Down, Depressed, Hopeless 0 2 2  PHQ - 2 Score 0 4 4  Altered sleeping - 2 2  Tired, decreased energy - 2 2  Change in appetite - 2 2  Feeling bad or failure about yourself  - 2 2  Trouble concentrating - 2 1  Moving slowly or fidgety/restless - 2 1  Suicidal thoughts - 0 0  PHQ-9 Score - 16 14  Difficult doing work/chores - Very difficult Very difficult  Some recent data might be hidden     ***Other: (CHADS2VASc if Afib, MMRC or CAT for COPD, ACT, DEXA)  Social History   Tobacco  Use  Smoking Status Former Smoker  . Packs/day: 0.50  . Years: 60.00  . Pack years: 30.00  . Types: Cigarettes  Smokeless Tobacco Never Used  Tobacco Comment   using patch   BP Readings from Last 3 Encounters:  03/02/20 131/69  02/11/20 112/60  12/23/19 128/66   Pulse Readings from Last 3 Encounters:  03/02/20 82  02/11/20 87  12/23/19 78   Wt Readings from Last 3 Encounters:  02/11/20 162 lb (73.5 kg)  11/13/17 170 lb (77.1 kg)  10/11/17 170 lb (77.1 kg)    Assessment/Interventions: Review of patient past medical history, allergies, medications, health status, including review of consultants reports, laboratory and other test data, was performed as part of comprehensive evaluation and provision of chronic care management services.   SDOH:  (Social Determinants of Health) assessments and interventions performed: {yes/no:20286}   CCM Care Plan  No Known Allergies  Medications Reviewed Today    Reviewed by Ivy Lynn, NP (Nurse Practitioner) on 03/15/20 at [place] List Status: <None>  Medication Order Taking? Sig Documenting Provider Last Dose Status Informant  albuterol (PROVENTIL HFA;VENTOLIN HFA) 108 (90 Base) MCG/ACT inhaler [id number] No Inhale 2 puffs into the lungs every 4 (four) hours as needed for wheezing or shortness of breath. Alycia Rossetti, MD Taking Active Spouse/Significant Other  albuterol (PROVENTIL) (2.5 MG/3ML) 0.083% nebulizer solution [id number] No USE 1 VIAL IN NEBULIZER EVERY 6 HOURS AS NEEDED FOR SHORTNESS OF BREATH AND WHEEZING Martyn Ehrich, NP Taking Active   amLODipine (NORVASC) 5 MG tablet [id number] No TAKE (1) TABLET BY MOUTH AT BEDTIME. Alycia Rossetti, MD Taking Active   aspirin 325 MG tablet [id number] No Take 325 mg by mouth daily. [provider] Taking Active   aspirin EC 325 MG tablet [id number] No TAKE 1 TABLET BY MOUTH ONCE A DAY. [place], Modena Nunnery, MD Taking Active   atorvastatin (LIPITOR) 40 MG tablet [id number]  No TAKE (1) TABLET BY MOUTH AT BEDTIME. [place], Modena Nunnery, MD Taking Active   Cholecalciferol (VITAMIN D-3) 25 MCG (1000 UT) CAPS [id number] No Take 1 capsule (1,000 Units total) by mouth daily. [place], Modena Nunnery, MD Taking Active   ciprofloxacin (CIPRO) 500 MG tablet [id number] No Take 1 tablet (500 mg total) by mouth 2 (two) times daily. [name], NP Taking Active   clotrimazole-betamethasone (LOTRISONE) cream [id number] No APPLY 1 APPLICATION TOPICALLY TWICE DAILY. [place], Modena Nunnery, MD Taking Active   fluticasone [facility]) 50 MCG/ACT nasal spray [id number] No USE 1 OR 2 SPRAYS IN EACH NOSTRIL EVERY DAY FOR NASAL ALLERGIES. Alycia Rossetti, MD Taking Active   levETIRAcetam (KEPPRA) 250 MG tablet [id number] No TAKE (1) TABLET BY MOUTH EACH MORNING.  [place], Modena Nunnery, MD Taking Active   lisinopril (ZESTRIL) 10 MG tablet [id number] No TAKE 1 TABLET BY MOUTH ONCE A DAY FOR HIGH BLOOD PRESSURE. TAKE WITH 20MG TABLET FOR TOTAL DOSE OF 30MG. [place], Modena Nunnery, MD Taking Active   lisinopril (ZESTRIL) 20 MG tablet [id number] No TAKE 1 TABLET BY MOUTH ONCE A DAY. TAKE WITH 10MG TABLET FOR TOTAL DOSE OF 30MG. [place], Modena Nunnery, MD Taking Active   LORazepam (ATIVAN) 1 MG tablet [id number] No TAKE (1) TABLET BY MOUTH AT BEDTIME. [place], Modena Nunnery, MD Taking Active   lubiprostone (AMITIZA) 24 MCG capsule [id number] No TAKE 1 CAPSULE BY MOUTH TWICE DAILY WITH MEALS. Alycia Rossetti, MD Taking Active   MYRBETRIQ 25 MG TB24 tablet [id number] No TAKE 1 TABLET BY MOUTH ONCE A DAY. [place], Modena Nunnery, MD Taking Active   naproxen sodium (ALEVE) 220 MG tablet [id number] No Take 440 mg by mouth daily as needed. [provider] Taking Active Spouse/Significant Other  NITROSTAT 0.4 MG SL tablet [id number] No PLACE 1 TAB UNDER TONGUE EVERY 5 MIN IF NEEDED FOR CHEST PAIN. MAY USE 3 TIMES.NO RELIEF CALL 911. Alycia Rossetti, MD Taking Active   nystatin (MYCOSTATIN/NYSTOP) powder [id number] No APPLY TOPCIALLY TO  AFFECTED AREAS 4 TIMES DAILY. Susy Frizzle, MD Taking Active   omeprazole (PRILOSEC) 20 MG capsule [id number] No TAKE 1 CAPSULE BY MOUTH ONCE DAILY FOR RELFUX, ESOPHAGITIS, OR STOMACH ULCERS. Alycia Rossetti, MD Taking Active   PARoxetine (PAXIL) 40 MG tablet [id number] No TAKE 1 TABLET BY MOUTH ONCE A DAY. Alycia Rossetti, MD Taking Active   Respiratory Therapy Supplies (NEBULIZER/TUBING/MOUTHPIECE) KIT [id number] No Disp one nebulizer machine, tubing set and mouthpiece kit Delsa Grana, PA-C Taking Active   thiamine (VITAMIN B-1) 100 MG tablet [id number] No TAKE 1 TABLET BY MOUTH ONCE A DAY. Alycia Rossetti, MD Taking Active   thiamine 100 MG tablet [id number] No TAKE 1 TABLET BY MOUTH ONCE A DAY. Alycia Rossetti, MD Taking Active   traZODone (DESYREL) 100 MG tablet [id number] No TAKE (1) TABLET BY MOUTH AT BEDTIME. Alycia Rossetti, MD Taking Active   vitamin B-12 (CYANOCOBALAMIN) 1000 MCG tablet [id number] No TAKE 1 TABLET BY MOUTH ONCE A DAY. Alycia Rossetti, MD Taking Active   Zinc Oxide 30.6 % CREA [id number] No 1 application to perineum and buttocks with each brief change. Alycia Rossetti, MD Taking Active           Patient Active Problem List   Diagnosis Date Noted  . Cerebrovascular accident (CVA) due to embolism of left middle cerebral artery ([place]) 12/14/2018  . Abnormal findings on diagnostic imaging of lung 08/19/2018  . IPF (idiopathic pulmonary fibrosis) ([place]) 08/19/2018  . OSA (obstructive sleep apnea) 08/19/2018  . Carotid artery disease ([place]) 03/26/2017  . Internal carotid artery stenosis, left 03/06/2017  . Dysphagia 03/06/2017  . Pulmonary nodules 03/06/2017  . Seizure ([place]) 03/03/2017  . Protein-calorie malnutrition ([place]) 01/10/2017  . Chronic pain 10/02/2016  . Depression, major, single episode, moderate ([place]) 08/31/2016  . Chronic insomnia 08/31/2016  . Lumbar spinal stenosis 08/18/2016  . Wheelchair bound 08/18/2016  . Dementia ([place]) 08/18/2016  .  Urinary incontinence 08/18/2016  . Polypharmacy 08/18/2016  . Laryngopharyngeal reflux (LPR) 08/12/2015  . Memory difficulty 08/04/2014  . Numbness and tingling of right arm 08/04/2014  . Abnormality of gait 08/01/2013  . Anemia 12/05/2012  . Early satiety 12/03/2012  . History of colon polyps 12/03/2012  . Facial droop  10/12/2012  . Spinal stenosis of cervical region 07/01/2012  . GERD 05/27/2009  . DYSPHAGIA UNSPECIFIED 05/27/2009  . CHANGE IN BOWELS 05/27/2009  . SMOKER 09/09/2008  . Essential hypertension 09/09/2008  . History of stroke 09/09/2008  . Constipation 09/09/2008  . HIGH BLOOD PRESSURE 07/25/2006    Immunization History  Administered Date(s) Administered  . Fluad Quad(high Dose 65+) 10/08/2018, 11/11/2019  . Influenza, High Dose Seasonal PF 09/18/2017  . Influenza,inj,Quad PF,6+ Mos 10/02/2016  . Moderna Sars-Covid-2 Vaccination 03/27/2019, 04/18/2019, 12/10/2019  . Pneumococcal Conjugate-13 02/02/2017  . Pneumococcal Polysaccharide-23 11/14/1995, 08/20/2019  . Tdap 04/22/2017, 04/25/2017    Conditions to be addressed/monitored:  {USCCMDZASSESSMENTOPTIONS:23563}  There are no care plans that you recently modified to display for this patient.    Medication Assistance: {MEDASSISTANCEINFO:25044}  Patient's preferred pharmacy is:  [facility] [place] [address] [place] [address] Phone: [phone] Fax: [phone]  Uses pill box? {Yes or If no, why not?:20788} Pt endorses ***% compliance  We discussed: {Pharmacy options:24294} Patient decided to: {US Pharmacy Plan:23885}  Care Plan and Follow Up Patient Decision:  {FOLLOWUP:24991}  Plan: {CM FOLLOW UP PLAN:25073}  ***

## 2020-03-22 ENCOUNTER — Telehealth: Payer: Self-pay

## 2020-03-23 DIAGNOSIS — J449 Chronic obstructive pulmonary disease, unspecified: Secondary | ICD-10-CM | POA: Diagnosis not present

## 2020-03-23 DIAGNOSIS — J84112 Idiopathic pulmonary fibrosis: Secondary | ICD-10-CM | POA: Diagnosis not present

## 2020-03-30 ENCOUNTER — Ambulatory Visit: Payer: Medicare Other | Admitting: Nurse Practitioner

## 2020-03-31 ENCOUNTER — Other Ambulatory Visit: Payer: Self-pay | Admitting: Family Medicine

## 2020-03-31 ENCOUNTER — Ambulatory Visit (INDEPENDENT_AMBULATORY_CARE_PROVIDER_SITE_OTHER): Payer: Medicare Other | Admitting: Family Medicine

## 2020-03-31 ENCOUNTER — Encounter: Payer: Self-pay | Admitting: Family Medicine

## 2020-03-31 ENCOUNTER — Other Ambulatory Visit: Payer: Self-pay

## 2020-03-31 VITALS — BP 124/64 | HR 87 | Temp 98.2°F | Ht 69.0 in

## 2020-03-31 DIAGNOSIS — R3915 Urgency of urination: Secondary | ICD-10-CM | POA: Diagnosis not present

## 2020-03-31 LAB — URINALYSIS, COMPLETE
Bilirubin, UA: NEGATIVE
Glucose, UA: NEGATIVE
Ketones, UA: NEGATIVE
Leukocytes,UA: NEGATIVE
Nitrite, UA: NEGATIVE
Protein,UA: NEGATIVE
Specific Gravity, UA: 1.02 (ref 1.005–1.030)
Urobilinogen, Ur: 0.2 mg/dL (ref 0.2–1.0)
pH, UA: 6.5 (ref 5.0–7.5)

## 2020-03-31 LAB — MICROSCOPIC EXAMINATION
Bacteria, UA: NONE SEEN
Epithelial Cells (non renal): NONE SEEN /hpf (ref 0–10)
RBC, Urine: NONE SEEN /hpf (ref 0–2)
WBC, UA: NONE SEEN /hpf (ref 0–5)

## 2020-03-31 NOTE — Progress Notes (Signed)
Acute Office Visit  Subjective:    Patient ID: [name], male    DOB: February 26, 1934, 86 y.o.   MRN: [id number]  Chief Complaint  Patient presents with  . Urinary Urgency    HPI Patient is in today for urinary urgency with "strong urine" x 3 days. He is not voiding much during the day. Denies dysuria, fever, or flank pain. He has been having incontinence at night. During the day the only drinks about 16 ounces of water total throughout the day.   Past Medical History:  Diagnosis Date  . Abnormality of gait 08/01/2013  . Arthritis   . Blindness of left eye    Posttraumatic  . Carotid artery disease ([place])   . Colon polyp   . Constipation   . Diaphragmatic hernia   . Disc disease, degenerative, cervical   . Diverticulitis   . ED (erectile dysfunction)   . Gastroparesis   . GERD (gastroesophageal reflux disease)   . Glaucoma   . Heart disease   . Hyperlipidemia   . Hypertension   . Insomnia   . Memory difficulty 08/04/2014  . Nicotine dependence   . Numbness and tingling of right arm 08/04/2014  . Osteoarthritis   . Overactive bladder   . Radiculopathy   . Shortness of breath    with activity  . Sleep apnea    does not use CPAP.  Tested maybe 5- 10 years ago  . Stroke [facility])    Left side weakness.  . Unilateral inguinal hernia     Past Surgical History:  Procedure Laterality Date  . ANTERIOR CERVICAL DECOMP/DISCECTOMY FUSION N/A 07/01/2012   Procedure: ANTERIOR CERVICAL DECOMPRESSION/DISCECTOMY FUSION CERVICAL FIVE-SIX [name] REMOVAL CERVICAL SIX-SEVEN;  Surgeon: [name], MD;  Location: [place] NEURO ORS;  Service: Neurosurgery;  Laterality: N/A;  . CERVICAL FUSION    . COLONOSCOPY   08/04/2002     RMR: Normal rectum/Pancolonic diverticula/Colonic polyps as described above, biopsied and/or snared  . COLONOSCOPY  60/08/2009   RMR: normal rectum/left and right sided diverticula/multiple colonic polyps. tubular adenomas. surveillance due 2014  . COLONOSCOPY WITH  ESOPHAGOGASTRODUODENOSCOPY (EGD) N/A 12/18/2012   Dr. [name]:  Colonic diverticulosis. Multiple colonic polyps-hyperplastic polyps and tubular adenoma. Friable anal canal hemorrhoids. EGD with chronic atrophic gastritis  . ESOPHAGOGASTRODUODENOSCOPY  04/14/2002   EUM:PNTIRWER'X' ring, status post dilation as described above/Hiatal hernia, focal antral erosions of uncertain clinical significance  . ESOPHAGOGASTRODUODENOSCOPY  06/09/2009   RMR: normal esophagus s/p dilator/small hiatal hernia otherwise normal  . HERNIA REPAIR Right    inguinal  . I & D EXTREMITY Right 04/23/2017   Procedure: [place] HAND;  Surgeon: [name], MD;  Location: [place];  Service: Orthopedics;  Laterality: Right;  . INGUINAL HERNIA REPAIR Left 04/15/2014   Procedure: LEFT INGUINAL HERNIORRHAPHY WITH MESH;  Surgeon: [name], MD;  Location: [facility];  Service: General;  Laterality: Left;  . INSERTION OF MESH Left 04/15/2014   Procedure: INSERTION OF MESH;  Surgeon: [name], MD;  Location: [facility];  Service: General;  Laterality: Left;    Family History  Problem Relation Age of Onset  . Cancer Mother   . Dementia Father   . Colon cancer Neg Hx     Social History   Socioeconomic History  . Marital status: Married    Spouse name: Not on file  . Number of children: 2  . Years of education: hs  . Highest education level: Not on file  Occupational History  . Occupation: Retired    Fish farm manager: RETIRED  Tobacco Use  . Smoking status: Former Smoker    Packs/day: 0.50    Years: 60.00    Pack years: 30.00    Types: Cigarettes  . Smokeless tobacco: Never Used  . Tobacco comment: using patch  Substance and Sexual Activity  . Alcohol use: No  . Drug use: No  . Sexual activity: Not Currently  Other Topics Concern  . Not on file  Social History Narrative   Patient drinks 1-2 cups of caffeine daily.   Patient is right handed.   Social Determinants of Health   Financial  Resource Strain: Low Risk   . Difficulty of Paying Living Expenses: Not very hard  Food Insecurity: Not on file  Transportation Needs: Not on file  Physical Activity: Not on file  Stress: Not on file  Social Connections: Not on file  Intimate Partner Violence: Not on file    Outpatient Medications Prior to Visit  Medication Sig Dispense Refill  . albuterol (PROVENTIL HFA;VENTOLIN HFA) 108 (90 Base) MCG/ACT inhaler Inhale 2 puffs into the lungs every 4 (four) hours as needed for wheezing or shortness of breath. 1 Inhaler 0  . albuterol (PROVENTIL) (2.5 MG/3ML) 0.083% nebulizer solution USE 1 VIAL IN NEBULIZER EVERY 6 HOURS AS NEEDED FOR SHORTNESS OF BREATH AND WHEEZING 150 mL 5  . amLODipine (NORVASC) 5 MG tablet TAKE (1) TABLET BY MOUTH AT BEDTIME. 30 tablet 0  . aspirin 325 MG tablet Take 325 mg by mouth daily.    [name] aspirin EC 325 MG tablet TAKE 1 TABLET BY MOUTH ONCE A DAY. 30 tablet 0  . atorvastatin (LIPITOR) 40 MG tablet TAKE (1) TABLET BY MOUTH AT BEDTIME. 30 tablet 3  . Cholecalciferol (VITAMIN D-3) 25 MCG (1000 UT) CAPS Take 1 capsule (1,000 Units total) by mouth daily. 30 capsule 6  . clotrimazole-betamethasone (LOTRISONE) cream APPLY 1 APPLICATION TOPICALLY TWICE DAILY. 60 g 0  . fluticasone (FLONASE) 50 MCG/ACT nasal spray USE 1 OR 2 SPRAYS IN EACH NOSTRIL EVERY DAY FOR NASAL ALLERGIES. 16 g 3  . levETIRAcetam (KEPPRA) 250 MG tablet TAKE (1) TABLET BY MOUTH EACH MORNING. 30 tablet 3  . lisinopril (ZESTRIL) 10 MG tablet TAKE 1 TABLET BY MOUTH ONCE A DAY FOR HIGH BLOOD PRESSURE. TAKE WITH 20MG TABLET FOR TOTAL DOSE OF 30MG. 30 tablet 3  . lisinopril (ZESTRIL) 20 MG tablet TAKE 1 TABLET BY MOUTH ONCE A DAY. TAKE WITH 10MG TABLET FOR TOTAL DOSE OF 30MG. 30 tablet 3  . LORazepam (ATIVAN) 1 MG tablet TAKE (1) TABLET BY MOUTH AT BEDTIME. 30 tablet 3  . lubiprostone (AMITIZA) 24 MCG capsule TAKE 1 CAPSULE BY MOUTH TWICE DAILY WITH MEALS. 60 capsule 6  . MYRBETRIQ 25 MG TB24 tablet TAKE 1  TABLET BY MOUTH ONCE A DAY. 30 tablet 3  . naproxen sodium (ALEVE) 220 MG tablet Take 440 mg by mouth daily as needed.    [name] NITROSTAT 0.4 MG SL tablet PLACE 1 TAB UNDER TONGUE EVERY 5 MIN IF NEEDED FOR CHEST PAIN. MAY USE 3 TIMES.NO RELIEF CALL 911. 25 tablet 0  . nystatin (MYCOSTATIN/NYSTOP) powder APPLY TOPCIALLY TO AFFECTED AREAS 4 TIMES DAILY. 60 g 5  . omeprazole (PRILOSEC) 20 MG capsule TAKE 1 CAPSULE BY MOUTH ONCE DAILY FOR RELFUX, ESOPHAGITIS, OR STOMACH ULCERS. 30 capsule 3  . PARoxetine (PAXIL) 40 MG tablet TAKE 1 TABLET BY MOUTH ONCE A DAY. 30 tablet 0  . Respiratory  Therapy Supplies (NEBULIZER/TUBING/MOUTHPIECE) KIT Disp one nebulizer machine, tubing set and mouthpiece kit 1 each 0  . thiamine (VITAMIN B-1) 100 MG tablet TAKE 1 TABLET BY MOUTH ONCE A DAY. 30 tablet 0  . thiamine 100 MG tablet TAKE 1 TABLET BY MOUTH ONCE A DAY. 30 tablet 6  . traZODone (DESYREL) 100 MG tablet TAKE (1) TABLET BY MOUTH AT BEDTIME. 30 tablet 3  . vitamin B-12 (CYANOCOBALAMIN) 1000 MCG tablet TAKE 1 TABLET BY MOUTH ONCE A DAY. 30 tablet 3  . Zinc Oxide 58.3 % CREA 1 application to perineum and buttocks with each brief change. 1000 g 3  . ciprofloxacin (CIPRO) 500 MG tablet Take 1 tablet (500 mg total) by mouth 2 (two) times daily. 28 tablet 0   No facility-administered medications prior to visit.    No Known Allergies  Review of Systems As per HPI.     Objective:    Physical Exam Vitals and nursing note reviewed.  HENT:     Head: Normocephalic and atraumatic.  Cardiovascular:     Rate and Rhythm: Normal rate and regular rhythm.     Heart sounds: Normal heart sounds. No murmur heard.   Pulmonary:     Effort: Pulmonary effort is normal. No respiratory distress.     Breath sounds: Normal breath sounds.  Abdominal:     General: There is no distension.     Palpations: Abdomen is soft.     Tenderness: There is no abdominal tenderness. There is no right CVA tenderness, left CVA tenderness,  guarding or rebound.  Neurological:     Mental Status: He is alert and oriented to person, place, and time.     Gait: Gait abnormal (wheelchair).  Psychiatric:        Mood and Affect: Mood normal.        Behavior: Behavior normal.     BP 124/64   Pulse 87   Temp 98.2 F (36.8 C) (Temporal)   Ht 5' 9"  (1.753 m)   BMI 23.92 kg/m  Wt Readings from Last 3 Encounters:  02/11/20 162 lb (73.5 kg)  11/13/17 170 lb (77.1 kg)  10/11/17 170 lb (77.1 kg)    There are no preventive care reminders to display for this patient.  There are no preventive care reminders to display for this patient.   Lab Results  Component Value Date   TSH 1.53 11/20/2018   Lab Results  Component Value Date   WBC 6.2 12/23/2019   HGB 13.0 (L) 12/23/2019   HCT 39.3 12/23/2019   MCV 91.2 12/23/2019   PLT 191 12/23/2019   Lab Results  Component Value Date   NA 141 12/23/2019   K 4.6 12/23/2019   CO2 27 12/23/2019   GLUCOSE 84 12/23/2019   BUN 15 12/23/2019   CREATININE 0.72 12/23/2019   BILITOT 0.5 12/23/2019   ALKPHOS 87 04/22/2017   AST 16 12/23/2019   ALT 7 (L) 12/23/2019   PROT 6.8 12/23/2019   ALBUMIN 3.2 (L) 04/22/2017   CALCIUM 9.3 12/23/2019   ANIONGAP 7 04/25/2017   Lab Results  Component Value Date   CHOL 118 08/20/2019   Lab Results  Component Value Date   HDL 59 08/20/2019   Lab Results  Component Value Date   LDLCALC 46 08/20/2019   Lab Results  Component Value Date   TRIG 58 08/20/2019   Lab Results  Component Value Date   CHOLHDL 2.0 08/20/2019   Lab Results  Component Value Date  HGBA1C 5.9 (H) 12/23/2019       Assessment & Plan:   [place] was seen today for urinary urgency.  Diagnoses and all orders for this visit:  Urinary urgency UA unremarkable today. Discussed need to increase water intake during the day. Keep follow up appointment with PCP. Return to office for new or worsening symptoms.  -     Urinalysis, Complete  The patient indicates  understanding of these issues and agrees with the plan.   Gwenlyn Perking, FNP

## 2020-03-31 NOTE — Patient Instructions (Signed)
Dehydration, Elderly Dehydration is a condition in which there is not enough water or other fluids in the body. This happens when a person loses more fluids than he or she takes in. Important organs, such as the kidneys, brain, and heart, cannot function without a proper amount of fluids. Any loss of fluids from the body can lead to dehydration. People 84 years of age or older have a higher risk of dehydration than younger adults. This is because in older age, the body:  Is less able to maintain the right amount of water.  Does not respond to temperature changes as well.  Does not get a sense of thirst as easily or quickly. Dehydration can be mild, moderate, or severe. It should be treated right away to prevent it from becoming severe. What are the causes? Dehydration may be caused by:  Conditions that cause loss of water or other fluids, such as diarrhea, vomiting, or sweating or urinating a lot.  Not drinking enough fluids, especially when you are ill or doing activities that require a lot of energy.  Other illnesses and conditions, such as fever or infection.  Certain medicines, such as medicines that remove excess fluid from the body (diuretics).  Lack of safe drinking water.  Not being able to get enough water and food. What increases the risk? This condition is more likely to develop in people who:  Have a long-term (chronic) illness, such as: ? An illness that may cause you to urinate more, such as diabetes. ? Kidney, heart, or lung disease that have not been treated properly. ? A disease of the brain and nervous system or a disorder that affects your thinking and emotions, such as dementia.  Are 65 years or older.  Have a disability.  Live in a place that is high in altitude, where thinner, drier air causes you to have more fluid loss. What are the signs or symptoms? Symptoms of dehydration depend on how severe it is. Mild or moderate dehydration  Thirst.  Dry lips  or dry mouth.  Dizziness or light-headedness, especially when standing up from a seated position.  Muscle cramps.  Dark urine. Urine may be the color of tea.  Less urine or tears produced than usual.  Headache. Severe dehydration  Changes in skin. Your skin may be cold and clammy, blotchy, or pale. Your skin also may not return to normal after being lightly pinched and released.  Little or no tears, urine, or sweat.  Changes in vital signs, such as rapid breathing and low blood pressure. Your pulse may be weak or may be faster than 100 beats a minute when you are sitting still.  Other changes, such as: ? Feeling very thirsty. ? Sunken eyes. ? Cold hands and feet. ? Confusion. ? Being very tired (lethargic) or having trouble waking from sleep. ? Short-term weight loss. ? Loss of consciousness. How is this diagnosed? This condition is diagnosed based on your symptoms and a physical exam. Blood and urine tests may help confirm the diagnosis. How is this treated? Treatment for this condition depends on how severe it is. Treatment should be started right away. Do not wait until dehydration becomes severe. Severe dehydration is an emergency and needs to be treated in a hospital.  Mild or moderate dehydration can often be treated at home. You may be asked to: ? Drink more fluids. ? Drink an oral rehydration solution (ORS). This drink helps restore proper amounts of fluids and salts and minerals in the  blood (electrolytes).  Severe dehydration can be treated: ? With IV fluids. ? By correcting abnormal levels of electrolytes. This is often done by giving electrolytes through a tube that is passed through your nose and into your stomach (nasogastric tube, or NG tube). ? By treating the underlying cause of dehydration. Follow these instructions at home: Oral rehydration solution If told by your health care provider, drink an ORS:  Make an ORS by following instructions on the  package.  Start by drinking small amounts, about  cup (120 mL) every 5-10 minutes.  Slowly increase how much you drink until you have taken the amount recommended by your health care provider. Eating and drinking  Drink enough clear fluid to keep your urine pale yellow. If you were told to drink an ORS, finish the ORS first and then start slowly drinking other clear fluids. Drink fluids such as: ? Water. Do not drink only water. Doing that can lead to hyponatremia, which is having too little salt (sodium) in the body. ? Water from ice chips you suck on. ? Fruit juice that you have added water to (diluted fruit juice). ? Low-calorie sports drinks.  Eat foods that contain a healthy balance of electrolytes, such as bananas, oranges, potatoes, tomatoes, and spinach.  Do not drink alcohol.  Avoid the following: ? Drinks that contain a lot of sugar. These include high-calorie sports drinks, fruit juice that is not diluted, and soda. ? Caffeine. ? Foods that are greasy or contain a lot of fat or sugar.         General instructions  Take over-the-counter and prescription medicines only as told by your health care provider.  Do not take sodium tablets. This can lead to having too much sodium in the body (hypernatremia).  Return to your normal activities as told by your health care provider. Ask your health care provider what activities are safe for you.  Keep all follow-up visits as told by your health care provider. This is important. Contact a health care provider if you:  Have pain in your abdomen and the pain gets worse or stays in one area (localizes).  Have a rash.  Have a stiff neck.  Are more irritable than usual.  Are sleepier or have a harder time waking than usual.  Feel weak or dizzy.  Are very thirsty. Get help right away if you have:  Any symptoms of severe dehydration.  A fever.  A severe headache.  Symptoms of vomiting, such as: ? Your vomiting gets  worse or does not go away. ? Your vomit includes blood or green matter (bile). ? You cannot eat or drink without vomiting.  Problems with urination or bowel movements, such as: ? Diarrhea that gets worse or does not go away. ? Blood in your stool (feces). This may cause stool to look black and tarry. ? Not urinating, or urinating only a small amount of very dark urine, within 6-8 hours.  Trouble breathing.  Symptoms that get worse with treatment. These symptoms may represent a serious problem that is an emergency. Do not wait to see if the symptoms will go away. Get medical help right away. Call your local emergency services (911 in the [place] Do not drive yourself to the hospital. Summary  Dehydration is a condition in which there is not enough water or other fluids in the body. This happens when a person loses more fluids than he or she takes in.  Treatment for this condition depends on the  severity. Treatment should be started right away. Do not wait until dehydration becomes severe.  Drink enough clear fluid to keep your urine pale yellow. If you were told to drink an oral rehydration solution (ORS), finish the ORS first and then start slowly drinking other clear fluids.  Take over-the-counter and prescription medicines only as told by your health care provider.  Get help right away if you have any symptoms of severe dehydration. This information is not intended to replace advice given to you by your health care provider. Make sure you discuss any questions you have with your health care provider. Document Revised: 08/01/2018 Document Reviewed: 08/01/2018 Elsevier Patient Education  [place].

## 2020-04-06 ENCOUNTER — Encounter: Payer: Self-pay | Admitting: *Deleted

## 2020-04-08 DIAGNOSIS — M4800 Spinal stenosis, site unspecified: Secondary | ICD-10-CM | POA: Diagnosis not present

## 2020-04-08 DIAGNOSIS — J449 Chronic obstructive pulmonary disease, unspecified: Secondary | ICD-10-CM | POA: Diagnosis not present

## 2020-04-08 DIAGNOSIS — I10 Essential (primary) hypertension: Secondary | ICD-10-CM | POA: Diagnosis not present

## 2020-04-21 ENCOUNTER — Ambulatory Visit (INDEPENDENT_AMBULATORY_CARE_PROVIDER_SITE_OTHER): Payer: Medicare Other | Admitting: Nurse Practitioner

## 2020-04-21 ENCOUNTER — Other Ambulatory Visit: Payer: Self-pay

## 2020-04-21 VITALS — BP 161/84 | HR 66 | Temp 97.9°F

## 2020-04-21 DIAGNOSIS — F5104 Psychophysiologic insomnia: Secondary | ICD-10-CM

## 2020-04-21 DIAGNOSIS — F339 Major depressive disorder, recurrent, unspecified: Secondary | ICD-10-CM | POA: Diagnosis not present

## 2020-04-21 DIAGNOSIS — R8290 Unspecified abnormal findings in urine: Secondary | ICD-10-CM

## 2020-04-21 DIAGNOSIS — I10 Essential (primary) hypertension: Secondary | ICD-10-CM

## 2020-04-21 LAB — URINALYSIS, ROUTINE W REFLEX MICROSCOPIC
Bilirubin, UA: NEGATIVE
Glucose, UA: NEGATIVE
Ketones, UA: NEGATIVE
Leukocytes,UA: NEGATIVE
Nitrite, UA: NEGATIVE
Protein,UA: NEGATIVE
Specific Gravity, UA: 1.015 (ref 1.005–1.030)
Urobilinogen, Ur: 1 mg/dL (ref 0.2–1.0)
pH, UA: 6 (ref 5.0–7.5)

## 2020-04-21 LAB — MICROSCOPIC EXAMINATION
Epithelial Cells (non renal): NONE SEEN /hpf (ref 0–10)
WBC, UA: NONE SEEN /hpf (ref 0–5)

## 2020-04-21 NOTE — Assessment & Plan Note (Signed)
Depression well controlled no changes to current dose.  Follow-up with worsening unresolved symptoms.  Education provided on depression and anxiety management printed handouts given.

## 2020-04-21 NOTE — Progress Notes (Signed)
Established Patient Office Visit  Subjective:  Patient ID: Elford Evilsizer, male    DOB: 04-26-1934  Age: 86 y.o. MRN: [id number]  CC:  Chief Complaint  Patient presents with  . Medical Management of Chronic Issues    HPI Jheremy Boger presents for follow up of hypertension. Patient was diagnosed in 09/09/2008 The patient is tolerating the medication well without side effects. Compliance with treatment has been good; including taking medication as directed , maintains a healthy diet and regular exercise regimen , and following up as directed.  Current blood pressure medication Norvasc 5 mg tablet by mouth at bedtime, lisinopril 20 mg tablet by mouth once a day take with 10 mg tablet for total of 30 mg tablet daily.    Depression: Patient complains of depression. He complains of depressed mood. Onset was approximately a few months ago, gradually improving since that time.  He denies current suicidal and homicidal plan or intent.   Family history significant for no psychiatric illness.Possible organic causes contributing are: none.  Risk factors: previous episode of depression Previous treatment includes Paxil. He complains of the following side effects from the treatment: none.   Past Medical History:  Diagnosis Date  . Abnormality of gait 08/01/2013  . Arthritis   . Blindness of left eye    Posttraumatic  . Carotid artery disease ([place])   . Colon polyp   . Constipation   . Diaphragmatic hernia   . Disc disease, degenerative, cervical   . Diverticulitis   . ED (erectile dysfunction)   . Gastroparesis   . GERD (gastroesophageal reflux disease)   . Glaucoma   . Heart disease   . Hyperlipidemia   . Hypertension   . Insomnia   . Memory difficulty 08/04/2014  . Nicotine dependence   . Numbness and tingling of right arm 08/04/2014  . Osteoarthritis   . Overactive bladder   . Radiculopathy   . Shortness of breath    with activity  . Sleep apnea    does not use CPAP.  Tested maybe 5- 10 years  ago  . Stroke [facility])    Left side weakness.  . Unilateral inguinal hernia     Past Surgical History:  Procedure Laterality Date  . ANTERIOR CERVICAL DECOMP/DISCECTOMY FUSION N/A 07/01/2012   Procedure: ANTERIOR CERVICAL DECOMPRESSION/DISCECTOMY FUSION CERVICAL FIVE-SIX [name] REMOVAL CERVICAL SIX-SEVEN;  Surgeon: [name], MD;  Location: [facility];  Service: Neurosurgery;  Laterality: N/A;  . CERVICAL FUSION    . COLONOSCOPY   08/04/2002     RMR: Normal rectum/Pancolonic diverticula/Colonic polyps as described above, biopsied and/or snared  . COLONOSCOPY  60/08/2009   RMR: normal rectum/left and right sided diverticula/multiple colonic polyps. tubular adenomas. surveillance due 2014  . COLONOSCOPY WITH ESOPHAGOGASTRODUODENOSCOPY (EGD) N/A 12/18/2012   Dr. [name]:  Colonic diverticulosis. Multiple colonic polyps-hyperplastic polyps and tubular adenoma. Friable anal canal hemorrhoids. EGD with chronic atrophic gastritis  . ESOPHAGOGASTRODUODENOSCOPY  04/14/2002   GHW:EXHBZJIR'C' ring, status post dilation as described above/Hiatal hernia, focal antral erosions of uncertain clinical significance  . ESOPHAGOGASTRODUODENOSCOPY  06/09/2009   RMR: normal esophagus s/p dilator/small hiatal hernia otherwise normal  . HERNIA REPAIR Right    inguinal  . I & D EXTREMITY Right 04/23/2017   Procedure: [place] HAND;  Surgeon: [name], MD;  Location: [facility];  Service: Orthopedics;  Laterality: Right;  . INGUINAL HERNIA REPAIR Left 04/15/2014   Procedure: LEFT INGUINAL HERNIORRHAPHY WITH MESH;  Surgeon: [name], MD;  Location: [facility];  Service: General;  Laterality: Left;  . INSERTION OF MESH Left 04/15/2014   Procedure: INSERTION OF MESH;  Surgeon: [name], MD;  Location: [facility];  Service: General;  Laterality: Left;    Family History  Problem Relation Age of Onset  . Cancer Mother   . Dementia Father   . Colon cancer Neg Hx     Social History    Socioeconomic History  . Marital status: Married    Spouse name: Not on file  . Number of children: 2  . Years of education: hs  . Highest education level: Not on file  Occupational History  . Occupation: Retired    Fish farm manager: RETIRED  Tobacco Use  . Smoking status: Former Smoker    Packs/day: 0.50    Years: 60.00    Pack years: 30.00    Types: Cigarettes  . Smokeless tobacco: Never Used  . Tobacco comment: using patch  Substance and Sexual Activity  . Alcohol use: No  . Drug use: No  . Sexual activity: Not Currently  Other Topics Concern  . Not on file  Social History Narrative   Patient drinks 1-2 cups of caffeine daily.   Patient is right handed.   Social Determinants of Health   Financial Resource Strain: Low Risk   . Difficulty of Paying Living Expenses: Not very hard  Food Insecurity: Not on file  Transportation Needs: Not on file  Physical Activity: Not on file  Stress: Not on file  Social Connections: Not on file  Intimate Partner Violence: Not on file    Outpatient Medications Prior to Visit  Medication Sig Dispense Refill  . albuterol (PROVENTIL HFA;VENTOLIN HFA) 108 (90 Base) MCG/ACT inhaler Inhale 2 puffs into the lungs every 4 (four) hours as needed for wheezing or shortness of breath. 1 Inhaler 0  . amLODipine (NORVASC) 5 MG tablet TAKE (1) TABLET BY MOUTH AT BEDTIME. 30 tablet 0  . aspirin 325 MG tablet Take 325 mg by mouth daily.    [name] atorvastatin (LIPITOR) 40 MG tablet TAKE (1) TABLET BY MOUTH AT BEDTIME. 30 tablet 3  . Cholecalciferol (VITAMIN D-3) 25 MCG (1000 UT) CAPS Take 1 capsule (1,000 Units total) by mouth daily. 30 capsule 6  . clotrimazole-betamethasone (LOTRISONE) cream APPLY 1 APPLICATION TOPICALLY TWICE DAILY. 60 g 0  . fluticasone (FLONASE) 50 MCG/ACT nasal spray USE 1 OR 2 SPRAYS IN EACH NOSTRIL EVERY DAY FOR NASAL ALLERGIES. 16 g 3  . levETIRAcetam (KEPPRA) 250 MG tablet TAKE (1) TABLET BY MOUTH EACH MORNING. 30 tablet 3  .  lisinopril (ZESTRIL) 10 MG tablet TAKE 1 TABLET BY MOUTH ONCE A DAY FOR HIGH BLOOD PRESSURE. TAKE WITH 20MG TABLET FOR TOTAL DOSE OF 30MG. 30 tablet 3  . lisinopril (ZESTRIL) 20 MG tablet TAKE 1 TABLET BY MOUTH ONCE A DAY. TAKE WITH 10MG TABLET FOR TOTAL DOSE OF 30MG. 30 tablet 3  . LORazepam (ATIVAN) 1 MG tablet TAKE (1) TABLET BY MOUTH AT BEDTIME. 30 tablet 3  . lubiprostone (AMITIZA) 24 MCG capsule TAKE 1 CAPSULE BY MOUTH TWICE DAILY WITH MEALS. 60 capsule 6  . MYRBETRIQ 25 MG TB24 tablet TAKE 1 TABLET BY MOUTH ONCE A DAY. 30 tablet 3  . naproxen sodium (ALEVE) 220 MG tablet Take 440 mg by mouth daily as needed.    [name] NITROSTAT 0.4 MG SL tablet PLACE 1 TAB UNDER TONGUE EVERY 5 MIN IF NEEDED FOR CHEST PAIN. MAY USE 3 TIMES.NO RELIEF CALL 911.  25 tablet 0  . nystatin (MYCOSTATIN/NYSTOP) powder APPLY TOPCIALLY TO AFFECTED AREAS 4 TIMES DAILY. 60 g 5  . omeprazole (PRILOSEC) 20 MG capsule TAKE 1 CAPSULE BY MOUTH ONCE DAILY FOR RELFUX, ESOPHAGITIS, OR STOMACH ULCERS. 30 capsule 3  . PARoxetine (PAXIL) 40 MG tablet TAKE 1 TABLET BY MOUTH ONCE A DAY. 30 tablet 0  . Respiratory Therapy Supplies (NEBULIZER/TUBING/MOUTHPIECE) KIT Disp one nebulizer machine, tubing set and mouthpiece kit 1 each 0  . thiamine (VITAMIN B-1) 100 MG tablet TAKE 1 TABLET BY MOUTH ONCE A DAY. 30 tablet 0  . thiamine 100 MG tablet TAKE 1 TABLET BY MOUTH ONCE A DAY. 30 tablet 6  . traZODone (DESYREL) 100 MG tablet TAKE (1) TABLET BY MOUTH AT BEDTIME. 30 tablet 3  . vitamin B-12 (CYANOCOBALAMIN) 1000 MCG tablet TAKE 1 TABLET BY MOUTH ONCE A DAY. 30 tablet 3  . Zinc Oxide 98.9 % CREA 1 application to perineum and buttocks with each brief change. 1000 g 3  . albuterol (PROVENTIL) (2.5 MG/3ML) 0.083% nebulizer solution USE 1 VIAL IN NEBULIZER EVERY 6 HOURS AS NEEDED FOR SHORTNESS OF BREATH AND WHEEZING 150 mL 5  . aspirin EC 325 MG tablet TAKE 1 TABLET BY MOUTH ONCE A DAY. 30 tablet 0   No facility-administered medications prior to  visit.   [facility] Office Visit from 04/21/2020 in [place] [facility]  PHQ-9 Total Score 12     PHQ9 SCORE ONLY 04/21/2020 03/02/2020 08/20/2019  PHQ-9 Total Score 12 0 16   No Known Allergies  ROS Review of Systems    Objective:    Physical Exam  BP (!) 161/84   Pulse 66   Temp 97.9 F (36.6 C) (Temporal)   SpO2 98%  Wt Readings from Last 3 Encounters:  02/11/20 162 lb (73.5 kg)  11/13/17 170 lb (77.1 kg)  10/11/17 170 lb (77.1 kg)     There are no preventive care reminders to display for this patient.  There are no preventive care reminders to display for this patient.  Lab Results  Component Value Date   TSH 1.53 11/20/2018   Lab Results  Component Value Date   WBC 6.2 12/23/2019   HGB 13.0 (L) 12/23/2019   HCT 39.3 12/23/2019   MCV 91.2 12/23/2019   PLT 191 12/23/2019   Lab Results  Component Value Date   NA 141 12/23/2019   K 4.6 12/23/2019   CO2 27 12/23/2019   GLUCOSE 84 12/23/2019   BUN 15 12/23/2019   CREATININE 0.72 12/23/2019   BILITOT 0.5 12/23/2019   ALKPHOS 87 04/22/2017   AST 16 12/23/2019   ALT 7 (L) 12/23/2019   PROT 6.8 12/23/2019   ALBUMIN 3.2 (L) 04/22/2017   CALCIUM 9.3 12/23/2019   ANIONGAP 7 04/25/2017   Lab Results  Component Value Date   CHOL 118 08/20/2019   Lab Results  Component Value Date   HDL 59 08/20/2019   Lab Results  Component Value Date   LDLCALC 46 08/20/2019   Lab Results  Component Value Date   TRIG 58 08/20/2019   Lab Results  Component Value Date   CHOLHDL 2.0 08/20/2019   Lab Results  Component Value Date   HGBA1C 5.9 (H) 12/23/2019      Assessment & Plan:   Problem List Items Addressed This Visit      Cardiovascular and Mediastinum   Essential hypertension    No changes to current medication blood pressure well controlled on current dose.  Continue low-sodium diet.  Education provided to patient with printed handouts given.  Follow-up in 3 months        Other    Chronic insomnia    Patient is sleeping well on Ativan 1 mg tablet at bedtime.  Caregiver reports half half tablet given at 4:00 and the other half given right before bedtime and sleep has been well managed on this regimen.      Abnormal urine odor - Primary   Relevant Orders   Urinalysis, Routine w reflex microscopic   Urinalysis, Routine w reflex microscopic   Depression, recurrent (HCC)    Depression well controlled no changes to current dose.  Follow-up with worsening unresolved symptoms.  Education provided on depression and anxiety management printed handouts given.         No orders of the defined types were placed in this encounter.   Follow-up: Return in about 3 months (around 07/21/2020) for depression.    Ivy Lynn, NP

## 2020-04-21 NOTE — Assessment & Plan Note (Signed)
No changes to current medication blood pressure well controlled on current dose.  Continue low-sodium diet.  Education provided to patient with printed handouts given.  Follow-up in 3 months

## 2020-04-21 NOTE — Patient Instructions (Addendum)
http://APA.org/depression-guideline"> https://clinicalkey.com"> http://point-of-care.elsevierperformancemanager.com/skills/"> http://point-of-care.elsevierperformancemanager.com">  Managing Depression, Adult Depression is a mental health condition that affects your thoughts, feelings, and actions. Being diagnosed with depression can bring you relief if you did not know why you have felt or behaved a certain way. It could also leave you feeling overwhelmed with uncertainty about your future. Preparing yourself to manage your symptoms can help you feel more positive about your future. How to manage lifestyle changes Managing stress Stress is your body's reaction to life changes and events, both good and bad. Stress can add to your feelings of depression. Learning to manage your stress can help lessen your feelings of depression. Try some of the following approaches to reducing your stress (stress reduction techniques):  Listen to music that you enjoy and that inspires you.  Try using a meditation app or take a meditation class.  Develop a practice that helps you connect with your spiritual self. Walk in nature, pray, or go to a place of worship.  Do some deep breathing. To do this, inhale slowly through your nose. Pause at the top of your inhale for a few seconds and then exhale slowly, letting your muscles relax.  Practice yoga to help relax and work your muscles. Choose a stress reduction technique that suits your lifestyle and personality. These techniques take time and practice to develop. Set aside 5-15 minutes a day to do them. Therapists can offer training in these techniques. Other things you can do to manage stress include:  Keeping a stress diary.  Knowing your limits and saying no when you think something is too much.  Paying attention to how you react to certain situations. You may not be able to control everything, but you can change your reaction.  Adding humor to your life by  watching funny films or TV shows.  Making time for activities that you enjoy and that relax you.   Medicines Medicines, such as antidepressants, are often a part of treatment for depression.  Talk with your pharmacist or health care provider about all the medicines, supplements, and herbal products that you take, their possible side effects, and what medicines and other products are safe to take together.  Make sure to report any side effects you may have to your health care provider. Relationships Your health care provider may suggest family therapy, couples therapy, or individual therapy as part of your treatment. How to recognize changes Everyone responds differently to treatment for depression. As you recover from depression, you may start to:  Have more interest in doing activities.  Feel less hopeless.  Have more energy.  Overeat less often, or have a better appetite.  Have better mental focus. It is important to recognize if your depression is not getting better or is getting worse. The symptoms you had in the beginning may return, such as:  Tiredness (fatigue) or low energy.  Eating too much or too little.  Sleeping too much or too little.  Feeling restless, agitated, or hopeless.  Trouble focusing or making decisions.  Unexplained physical complaints.  Feeling irritable, angry, or aggressive. If you or your family members notice these symptoms coming back, let your health care provider know right away. Follow these instructions at home: Activity  Try to get some form of exercise each day, such as walking, biking, swimming, or lifting weights.  Practice stress reduction techniques.  Engage your mind by taking a class or doing some volunteer work.   Lifestyle  Get the right amount and quality of sleep.    Cut down on using caffeine, tobacco, alcohol, and other potentially harmful substances.  Eat a healthy diet that includes plenty of vegetables, fruits,  whole grains, low-fat dairy products, and lean protein. Do not eat a lot of foods that are high in solid fats, added sugars, or salt (sodium). General instructions  Take over-the-counter and prescription medicines only as told by your health care provider.  Keep all follow-up visits as told by your health care provider. This is important. Where to find support Talking to others Friends and family members can be sources of support and guidance. Talk to trusted friends or family members about your condition. Explain your symptoms to them, and let them know that you are working with a health care provider to treat your depression. Tell friends and family members how they also can be helpful.   Finances  Find appropriate mental health providers that fit with your financial situation.  Talk with your health care provider about options to get reduced prices on your medicines. Where to find more information You can find support in your area from:  Anxiety and Depression Association of America (ADAA): www.adaa.org  Mental Health America: www.mentalhealthamerica.net  [employer] on Mental Illness: www.nami.org Contact a health care provider if:  You stop taking your antidepressant medicines, and you have any of these symptoms: ? Nausea. ? Headache. ? Light-headedness. ? Chills and body aches. ? Not being able to sleep (insomnia).  You or your friends and family think your depression is getting worse. Get help right away if:  You have thoughts of hurting yourself or others. If you ever feel like you may hurt yourself or others, or have thoughts about taking your own life, get help right away. Go to your nearest emergency department or:  Call your local emergency services (911 in the [place]  Call a suicide crisis helpline, such as the [facility] at 848 746 0479. This is open 24 hours a day in the [place]  Text the Crisis Text Line at 918-032-1354 (in the  [place].). Summary  If you are diagnosed with depression, preparing yourself to manage your symptoms is a good way to feel positive about your future.  Work with your health care provider on a management plan that includes stress reduction techniques, medicines (if applicable), therapy, and healthy lifestyle habits.  Keep talking with your health care provider about how your treatment is working.  If you have thoughts about taking your own life, call a suicide crisis helpline or text a crisis text line. This information is not intended to replace advice given to you by your health care provider. Make sure you discuss any questions you have with your health care provider. Document Revised: 10/30/2018 Document Reviewed: 10/30/2018 Elsevier Patient Education  2021 [place]. Dysuria Dysuria is pain or discomfort during urination. The pain or discomfort may be felt in the part of the body that drains urine from the bladder (urethra) or in the surrounding tissue of the genitals. The pain may also be felt in the groin area, lower abdomen, or lower back. You may have to urinate frequently or have the sudden feeling that you have to urinate (urgency). Dysuria can affect anyone, but it is more common in females. Dysuria can be caused by many different things, including:  Urinary tract infection.  Kidney stones or bladder stones.  Certain STIs (sexually transmitted infections), such as chlamydia.  Dehydration.  Inflammation of the tissues of the vagina.  Use of certain medicines.  Use of certain  soaps or scented products that cause irritation. Follow these instructions at home: Medicines  Take over-the-counter and prescription medicines only as told by your health care provider.  If you were prescribed an antibiotic medicine, take it as told by your health care provider. Do not stop taking the antibiotic even if you start to feel better. Eating and drinking  Drink enough fluid to keep  your urine pale yellow.  Avoid caffeinated beverages, tea, and alcohol. These beverages can irritate the bladder and make dysuria worse. In males, alcohol may irritate the prostate.   General instructions  Watch your condition for any changes.  Urinate often. Avoid holding urine for long periods of time.  If you are male, you should wipe from front to back after urinating or having a bowel movement. Use each piece of toilet paper only once.  Empty your bladder after sex.  Keep all follow-up visits. This is important.  If you had any tests done to find the cause of dysuria, it is up to you to get your test results. Ask your health care provider, or the department that is doing the test, when your results will be ready. Contact a health care provider if:  You have a fever.  You develop pain in your back or sides.  You have nausea or vomiting.  You have blood in your urine.  You are not urinating as often as you usually do. Get help right away if:  Your pain is severe and not relieved with medicines.  You cannot eat or drink without vomiting.  You are confused.  You have a rapid heartbeat while resting.  You have shaking or chills.  You feel extremely weak. Summary  Dysuria is pain or discomfort while urinating. Many different conditions can lead to dysuria.  If you have dysuria, you may have to urinate frequently or have the sudden feeling that you have to urinate (urgency).  Watch your condition for any changes. Keep all follow-up visits.  Make sure that you urinate often and drink enough fluid to keep your urine pale yellow. This information is not intended to replace advice given to you by your health care provider. Make sure you discuss any questions you have with your health care provider. Document Revised: 08/01/2019 Document Reviewed: 08/01/2019 Elsevier Patient Education  2021 [place].  Hypertension, Adult Hypertension is another name for high blood  pressure. High blood pressure forces your heart to work harder to pump blood. This can cause problems over time. There are two numbers in a blood pressure reading. There is a top number (systolic) over a bottom number (diastolic). It is best to have a blood pressure that is below 120/80. Healthy choices can help lower your blood pressure, or you may need medicine to help lower it. What are the causes? The cause of this condition is not known. Some conditions may be related to high blood pressure. What increases the risk?  Smoking.  Having type 2 diabetes mellitus, high cholesterol, or both.  Not getting enough exercise or physical activity.  Being overweight.  Having too much fat, sugar, calories, or salt (sodium) in your diet.  Drinking too much alcohol.  Having long-term (chronic) kidney disease.  Having a family history of high blood pressure.  Age. Risk increases with age.  Race. You may be at higher risk if you are African American.  Gender. Men are at higher risk than women before age 20. After age 11, women are at higher risk than men.  Having obstructive sleep apnea.  Stress. What are the signs or symptoms?  High blood pressure may not cause symptoms. Very high blood pressure (hypertensive crisis) may cause: ? Headache. ? Feelings of worry or nervousness (anxiety). ? Shortness of breath. ? Nosebleed. ? A feeling of being sick to your stomach (nausea). ? Throwing up (vomiting). ? Changes in how you see. ? Very bad chest pain. ? Seizures. How is this treated?  This condition is treated by making healthy lifestyle changes, such as: ? Eating healthy foods. ? Exercising more. ? Drinking less alcohol.  Your health care provider may prescribe medicine if lifestyle changes are not enough to get your blood pressure under control, and if: ? Your top number is above 130. ? Your bottom number is above 80.  Your personal target blood pressure may vary. Follow these  instructions at home: Eating and drinking  If told, follow the DASH eating plan. To follow this plan: ? Fill one half of your plate at each meal with fruits and vegetables. ? Fill one fourth of your plate at each meal with whole grains. Whole grains include whole-wheat pasta, brown rice, and whole-grain bread. ? Eat or drink low-fat dairy products, such as skim milk or low-fat yogurt. ? Fill one fourth of your plate at each meal with low-fat (lean) proteins. Low-fat proteins include fish, chicken without skin, eggs, beans, and tofu. ? Avoid fatty meat, cured and processed meat, or chicken with skin. ? Avoid pre-made or processed food.  Eat less than 1,500 mg of salt each day.  Do not drink alcohol if: ? Your doctor tells you not to drink. ? You are pregnant, may be pregnant, or are planning to become pregnant.  If you drink alcohol: ? Limit how much you use to:  0-1 drink a day for women.  0-2 drinks a day for men. ? Be aware of how much alcohol is in your drink. In the [place] one drink equals one 12 oz bottle of beer (355 mL), one 5 oz glass of wine (148 mL), or one 1 oz glass of hard liquor (44 mL).   Lifestyle  Work with your doctor to stay at a healthy weight or to lose weight. Ask your doctor what the best weight is for you.  Get at least 30 minutes of exercise most days of the week. This may include walking, swimming, or biking.  Get at least 30 minutes of exercise that strengthens your muscles (resistance exercise) at least 3 days a week. This may include lifting weights or doing Pilates.  Do not use any products that contain nicotine or tobacco, such as cigarettes, e-cigarettes, and chewing tobacco. If you need help quitting, ask your doctor.  Check your blood pressure at home as told by your doctor.  Keep all follow-up visits as told by your doctor. This is important.   Medicines  Take over-the-counter and prescription medicines only as told by your doctor. Follow  directions carefully.  Do not skip doses of blood pressure medicine. The medicine does not work as well if you skip doses. Skipping doses also puts you at risk for problems.  Ask your doctor about side effects or reactions to medicines that you should watch for. Contact a doctor if you:  Think you are having a reaction to the medicine you are taking.  Have headaches that keep coming back (recurring).  Feel dizzy.  Have swelling in your ankles.  Have trouble with your vision. Get help right away if  you:  Get a very bad headache.  Start to feel mixed up (confused).  Feel weak or numb.  Feel faint.  Have very bad pain in your: ? Chest. ? Belly (abdomen).  Throw up more than once.  Have trouble breathing. Summary  Hypertension is another name for high blood pressure.  High blood pressure forces your heart to work harder to pump blood.  For most people, a normal blood pressure is less than 120/80.  Making healthy choices can help lower blood pressure. If your blood pressure does not get lower with healthy choices, you may need to take medicine. This information is not intended to replace advice given to you by your health care provider. Make sure you discuss any questions you have with your health care provider. Document Revised: 08/29/2017 Document Reviewed: 08/29/2017 Elsevier Patient Education  2021 [employer].

## 2020-04-21 NOTE — Assessment & Plan Note (Signed)
Patient is sleeping well on Ativan 1 mg tablet at bedtime.  Caregiver reports half half tablet given at 4:00 and the other half given right before bedtime and sleep has been well managed on this regimen.

## 2020-04-22 ENCOUNTER — Ambulatory Visit: Payer: Medicare Other | Admitting: Nurse Practitioner

## 2020-04-23 DIAGNOSIS — J84112 Idiopathic pulmonary fibrosis: Secondary | ICD-10-CM | POA: Diagnosis not present

## 2020-04-23 DIAGNOSIS — J449 Chronic obstructive pulmonary disease, unspecified: Secondary | ICD-10-CM | POA: Diagnosis not present

## 2020-04-27 ENCOUNTER — Other Ambulatory Visit: Payer: Self-pay | Admitting: *Deleted

## 2020-04-27 MED ORDER — VITAMIN B-12 1000 MCG PO TABS: 1000.0000 ug | ORAL_TABLET | Freq: Every day | ORAL | 2 refills | Status: DC

## 2020-04-27 MED ORDER — PAROXETINE HCL 40 MG PO TABS: 40.0000 mg | ORAL_TABLET | Freq: Every day | ORAL | 2 refills | Status: DC

## 2020-04-27 MED ORDER — ASPIRIN 325 MG PO TABS: 325.0000 mg | ORAL_TABLET | Freq: Every day | ORAL | 2 refills | Status: DC

## 2020-04-27 MED ORDER — AMLODIPINE BESYLATE 5 MG PO TABS: ORAL_TABLET | ORAL | 0 refills | Status: DC

## 2020-04-27 MED ORDER — LORAZEPAM 1 MG PO TABS: ORAL_TABLET | ORAL | 0 refills | Status: DC

## 2020-04-27 MED ORDER — ATORVASTATIN CALCIUM 40 MG PO TABS: ORAL_TABLET | ORAL | 3 refills | Status: DC

## 2020-04-27 MED ORDER — LISINOPRIL 20 MG PO TABS: ORAL_TABLET | ORAL | 3 refills | Status: DC

## 2020-05-04 ENCOUNTER — Other Ambulatory Visit: Payer: Self-pay

## 2020-05-04 ENCOUNTER — Other Ambulatory Visit: Payer: Medicare Other

## 2020-05-04 ENCOUNTER — Other Ambulatory Visit: Payer: Self-pay | Admitting: Nurse Practitioner

## 2020-05-04 DIAGNOSIS — R300 Dysuria: Secondary | ICD-10-CM

## 2020-05-04 DIAGNOSIS — I10 Essential (primary) hypertension: Secondary | ICD-10-CM

## 2020-05-04 DIAGNOSIS — R8290 Unspecified abnormal findings in urine: Secondary | ICD-10-CM | POA: Diagnosis not present

## 2020-05-04 LAB — MICROSCOPIC EXAMINATION
Bacteria, UA: NONE SEEN
Epithelial Cells (non renal): NONE SEEN /hpf (ref 0–10)
RBC, Urine: NONE SEEN /hpf (ref 0–2)

## 2020-05-04 LAB — URINALYSIS, ROUTINE W REFLEX MICROSCOPIC
Bilirubin, UA: NEGATIVE
Glucose, UA: NEGATIVE
Ketones, UA: NEGATIVE
Leukocytes,UA: NEGATIVE
Nitrite, UA: NEGATIVE
Protein,UA: NEGATIVE
Specific Gravity, UA: 1.025 (ref 1.005–1.030)
Urobilinogen, Ur: 2 mg/dL — ABNORMAL HIGH (ref 0.2–1.0)
pH, UA: 6 (ref 5.0–7.5)

## 2020-05-05 LAB — COMPREHENSIVE METABOLIC PANEL
ALT: 7 IU/L (ref 0–44)
AST: 17 IU/L (ref 0–40)
Albumin/Globulin Ratio: 1 — ABNORMAL LOW (ref 1.2–2.2)
Albumin: 3.6 g/dL (ref 3.6–4.6)
Alkaline Phosphatase: 141 IU/L — ABNORMAL HIGH (ref 44–121)
BUN/Creatinine Ratio: 17 (ref 10–24)
BUN: 16 mg/dL (ref 8–27)
Bilirubin Total: 0.4 mg/dL (ref 0.0–1.2)
CO2: 22 mmol/L (ref 20–29)
Calcium: 8.9 mg/dL (ref 8.6–10.2)
Chloride: 104 mmol/L (ref 96–106)
Creatinine, Ser: 0.92 mg/dL (ref 0.76–1.27)
Globulin, Total: 3.5 g/dL (ref 1.5–4.5)
Glucose: 121 mg/dL — ABNORMAL HIGH (ref 65–99)
Potassium: 4 mmol/L (ref 3.5–5.2)
Sodium: 141 mmol/L (ref 134–144)
Total Protein: 7.1 g/dL (ref 6.0–8.5)
eGFR: 83 mL/min/{1.73_m2} (ref >59)

## 2020-05-05 LAB — CBC WITH DIFFERENTIAL/PLATELET
Basophils Absolute: 0.1 10*3/uL (ref 0.0–0.2)
Basos: 1 %
EOS (ABSOLUTE): 0.9 10*3/uL — ABNORMAL HIGH (ref 0.0–0.4)
Eos: 16 %
Hematocrit: 37.8 % (ref 37.5–51.0)
Hemoglobin: 12.6 g/dL — ABNORMAL LOW (ref 13.0–17.7)
Immature Grans (Abs): 0 10*3/uL (ref 0.0–0.1)
Immature Granulocytes: 0 %
Lymphocytes Absolute: 2.2 10*3/uL (ref 0.7–3.1)
Lymphs: 40 %
MCH: 30.1 pg (ref 26.6–33.0)
MCHC: 33.3 g/dL (ref 31.5–35.7)
MCV: 90 fL (ref 79–97)
Monocytes Absolute: 0.4 10*3/uL (ref 0.1–0.9)
Monocytes: 7 %
Neutrophils Absolute: 2 10*3/uL (ref 1.4–7.0)
Neutrophils: 36 %
Platelets: 170 10*3/uL (ref 150–450)
RBC: 4.18 x10E6/uL (ref 4.14–5.80)
RDW: 12.7 % (ref 11.6–15.4)
WBC: 5.5 10*3/uL (ref 3.4–10.8)

## 2020-05-05 LAB — LIPID PANEL
Chol/HDL Ratio: 1.8 ratio (ref 0.0–5.0)
Cholesterol, Total: 112 mg/dL (ref 100–199)
HDL: 61 mg/dL (ref >39)
LDL Chol Calc (NIH): 38 mg/dL (ref 0–99)
Triglycerides: 54 mg/dL (ref 0–149)
VLDL Cholesterol Cal: 13 mg/dL (ref 5–40)

## 2020-05-07 DIAGNOSIS — I10 Essential (primary) hypertension: Secondary | ICD-10-CM | POA: Diagnosis not present

## 2020-05-07 DIAGNOSIS — J449 Chronic obstructive pulmonary disease, unspecified: Secondary | ICD-10-CM | POA: Diagnosis not present

## 2020-05-11 ENCOUNTER — Telehealth: Payer: Self-pay

## 2020-05-11 ENCOUNTER — Other Ambulatory Visit: Payer: Self-pay | Admitting: Nurse Practitioner

## 2020-05-11 MED ORDER — THIAMINE MONONITRATE 100 MG PO TABS: 100.0000 mg | ORAL_TABLET | Freq: Every day | ORAL | 2 refills | Status: DC

## 2020-05-11 MED ORDER — VITAMIN B-12 1000 MCG PO TABS: 1000.0000 ug | ORAL_TABLET | Freq: Every day | ORAL | 2 refills | Status: DC

## 2020-05-11 NOTE — Telephone Encounter (Signed)
Caregiver wants to know if pt still taking B1, if so please send in to [facility] in [place].

## 2020-05-11 NOTE — Telephone Encounter (Signed)
Pt aware and sitter

## 2020-05-11 NOTE — Telephone Encounter (Signed)
Caregiver was wondering about B1 (Thiamine) and if he still supposed to be taking.  If so, needs a refill sent to [facility] in [place].

## 2020-05-18 DIAGNOSIS — L110 Acquired keratosis follicularis: Secondary | ICD-10-CM | POA: Diagnosis not present

## 2020-05-18 DIAGNOSIS — I739 Peripheral vascular disease, unspecified: Secondary | ICD-10-CM | POA: Diagnosis not present

## 2020-05-18 DIAGNOSIS — B351 Tinea unguium: Secondary | ICD-10-CM | POA: Diagnosis not present

## 2020-05-28 ENCOUNTER — Other Ambulatory Visit: Payer: Self-pay

## 2020-05-28 MED ORDER — LEVETIRACETAM 250 MG PO TABS: ORAL_TABLET | ORAL | 2 refills | Status: DC

## 2020-06-01 ENCOUNTER — Other Ambulatory Visit: Payer: Self-pay

## 2020-06-01 ENCOUNTER — Telehealth: Payer: Self-pay | Admitting: Nurse Practitioner

## 2020-06-01 MED ORDER — MIRABEGRON ER 25 MG PO TB24: 25.0000 mg | ORAL_TABLET | Freq: Every day | ORAL | 2 refills | Status: DC

## 2020-06-01 MED ORDER — LISINOPRIL 10 MG PO TABS: ORAL_TABLET | ORAL | 1 refills | Status: DC

## 2020-06-01 MED ORDER — OMEPRAZOLE 20 MG PO CPDR: DELAYED_RELEASE_CAPSULE | ORAL | 4 refills | Status: DC

## 2020-06-01 MED ORDER — LISINOPRIL 20 MG PO TABS: ORAL_TABLET | ORAL | 3 refills | Status: DC

## 2020-06-01 MED ORDER — LUBIPROSTONE 24 MCG PO CAPS: ORAL_CAPSULE | ORAL | 2 refills | Status: DC

## 2020-06-01 MED ORDER — VITAMIN D-3 25 MCG (1000 UT) PO CAPS: 1.0000 | ORAL_CAPSULE | Freq: Every day | ORAL | 6 refills | Status: DC

## 2020-06-01 NOTE — Telephone Encounter (Signed)
Left message to call back  

## 2020-06-04 ENCOUNTER — Other Ambulatory Visit: Payer: Self-pay | Admitting: Nurse Practitioner

## 2020-06-04 DIAGNOSIS — F5104 Psychophysiologic insomnia: Secondary | ICD-10-CM

## 2020-06-04 MED ORDER — AMLODIPINE BESYLATE 5 MG PO TABS: ORAL_TABLET | ORAL | 2 refills | Status: DC

## 2020-06-04 MED ORDER — LORAZEPAM 1 MG PO TABS: ORAL_TABLET | ORAL | 1 refills | Status: DC

## 2020-06-04 MED ORDER — TRAZODONE HCL 100 MG PO TABS: 100.0000 mg | ORAL_TABLET | Freq: Every evening | ORAL | 1 refills | Status: DC | PRN

## 2020-06-04 NOTE — Telephone Encounter (Signed)
Caregiver is requesting refill of Trazodone and Lorazepam.  Had appointment 04/21/20.  Explained that Lorazepam is not normally filled outside of office visit.  Please advise.

## 2020-06-04 NOTE — Telephone Encounter (Signed)
Patient seen you on 04/21/20 and was told to follow up in July which they made the follow up for but the Ativan was only written for  30 Days not 3 mths for him to make it to the next appt. Please advise.     Aware others have been sent

## 2020-06-04 NOTE — Telephone Encounter (Signed)
Patient aware and verbalized understanding. °

## 2020-06-04 NOTE — Telephone Encounter (Signed)
Amlodipine sent to pharmacy.  Can send message to [facility] to refill Trazodone.  Needs to schedule appointment to refill Lorazepam.  Left message to call back

## 2020-06-04 NOTE — Telephone Encounter (Signed)
  Prescription Request  06/04/2020  What is the name of the medication or equipment?   amLODipine (NORVASC) 5 MG tablet LORazepam (ATIVAN) 1 MG tablet traZODone (DESYREL) 100 MG tablet  Have you contacted your pharmacy to request a refill? (if applicable) yes  Which pharmacy would you like this sent to? [facility] [place] [facility]   Patient notified that their request is being sent to the clinical staff for review and that they should receive a response within 2 business days.

## 2020-06-07 DIAGNOSIS — J449 Chronic obstructive pulmonary disease, unspecified: Secondary | ICD-10-CM | POA: Diagnosis not present

## 2020-06-07 DIAGNOSIS — I10 Essential (primary) hypertension: Secondary | ICD-10-CM | POA: Diagnosis not present

## 2020-07-07 DIAGNOSIS — I10 Essential (primary) hypertension: Secondary | ICD-10-CM | POA: Diagnosis not present

## 2020-07-07 DIAGNOSIS — J449 Chronic obstructive pulmonary disease, unspecified: Secondary | ICD-10-CM | POA: Diagnosis not present

## 2020-07-23 ENCOUNTER — Ambulatory Visit (INDEPENDENT_AMBULATORY_CARE_PROVIDER_SITE_OTHER): Payer: Medicare Other | Admitting: Nurse Practitioner

## 2020-07-23 ENCOUNTER — Other Ambulatory Visit: Payer: Self-pay

## 2020-07-23 ENCOUNTER — Encounter: Payer: Self-pay | Admitting: Nurse Practitioner

## 2020-07-23 VITALS — BP 111/56 | HR 73 | Temp 98.2°F | Ht 69.0 in

## 2020-07-23 DIAGNOSIS — E46 Unspecified protein-calorie malnutrition: Secondary | ICD-10-CM | POA: Diagnosis not present

## 2020-07-23 DIAGNOSIS — I10 Essential (primary) hypertension: Secondary | ICD-10-CM | POA: Diagnosis not present

## 2020-07-23 DIAGNOSIS — K2100 Gastro-esophageal reflux disease with esophagitis, without bleeding: Secondary | ICD-10-CM

## 2020-07-23 DIAGNOSIS — F339 Major depressive disorder, recurrent, unspecified: Secondary | ICD-10-CM | POA: Diagnosis not present

## 2020-07-23 MED ORDER — CLOTRIMAZOLE-BETAMETHASONE 1-0.05 % EX CREA: TOPICAL_CREAM | CUTANEOUS | 0 refills | Status: DC

## 2020-07-23 NOTE — Assessment & Plan Note (Signed)
Patient supplementing nutrition with ensure and has no new concerns or weight loss.

## 2020-07-23 NOTE — Assessment & Plan Note (Signed)
Symptoms well controlled on  omeprazole 20 mg tablet daily.  Continue current dose and continue current dose.

## 2020-07-23 NOTE — Assessment & Plan Note (Signed)
Symptoms well controlled on current medication, no changes to dose. Completed PHQ 9

## 2020-07-23 NOTE — Progress Notes (Signed)
Established Patient Office Visit  Subjective:  Patient ID: [name], male    DOB: [birth year]  Age: 86 y.o. MRN: [id number]  CC:  Chief Complaint  Patient presents with   Medical Management of Chronic Issues    HPI [name] presents for  Anxiety, Follow-up  He was last seen for anxiety 4 years ago. Changes made at last visit include: Paxil 40 mg daily.   He reports good compliance with treatment. He reports good tolerance of treatment. He is not having side effects.   He feels his anxiety is mild and Improved since last visit.  Symptoms: No chest pain No difficulty concentrating  No dizziness No fatigue  No feelings of losing control No insomnia  No irritable No palpitations  No panic attacks No racing thoughts  No shortness of breath No sweating  No tremors/shakes    GAD-7 Results No flowsheet data found.  PHQ-9 Scores PHQ9 SCORE ONLY 07/23/2020 04/21/2020 03/02/2020  PHQ-9 Total Score 8 12 0    Depression, Follow-up  He  was last seen for this 4 years ago. Changes made at last visit include paxil 40 mg tablet daily.   He reports good compliance with treatment. He is not having side effects.   He reports good tolerance of treatment. Current symptoms include: depressed mood He feels he is Improved since last visit.  Depression screen [facility] 2/9 07/23/2020 04/21/2020 03/02/2020  Decreased Interest 2 2 0  Down, Depressed, Hopeless 2 2 0  PHQ - 2 Score 4 4 0  Altered sleeping 1 1 -  Tired, decreased energy 1 2 -  Change in appetite 0 2 -  Feeling bad or failure about yourself  0 1 -  Trouble concentrating 1 2 -  Moving slowly or fidgety/restless 1 0 -  Suicidal thoughts 0 0 -  PHQ-9 Score 8 12 -  Difficult doing work/chores Somewhat difficult - -  Some recent data might be hidden    -----------------------------------------------------------------------------------------   GERD, Follow up:  The patient was last seen for GERD 4 months ago. Changes made  since that visit include: Omeprazole 20 mg tablet by mouth daily.  He reports good compliance with treatment. He is not having side effects. [name]  He IS experiencing: no new symptoms He is NOT experiencing abdominal bloating, belching and eructation, bilious reflux, choking on food, cough, deep pressure at base of neck, or dysphagia  -----------------------------------------------------------------------------------------   Past Medical History:  Diagnosis Date   Abnormality of gait 08/01/2013   Arthritis    Blindness of left eye    Posttraumatic   Carotid artery disease (HCC)    Colon polyp    Constipation    Diaphragmatic hernia    Disc disease, degenerative, cervical    Diverticulitis    ED (erectile dysfunction)    Gastroparesis    GERD (gastroesophageal reflux disease)    Glaucoma    Heart disease    Hyperlipidemia    Hypertension    Insomnia    Memory difficulty 08/04/2014   Nicotine dependence    Numbness and tingling of right arm 08/04/2014   Osteoarthritis    Overactive bladder    Radiculopathy    Shortness of breath    with activity   Sleep apnea    does not use CPAP.  Tested maybe 5- 10 years ago   Stroke [facility])    Left side weakness.   Unilateral inguinal hernia     Past Surgical History:  Procedure Laterality Date  ANTERIOR CERVICAL DECOMP/DISCECTOMY FUSION N/A 07/01/2012   Procedure: ANTERIOR CERVICAL DECOMPRESSION/DISCECTOMY FUSION CERVICAL FIVE-SIX [name] REMOVAL CERVICAL SIX-SEVEN;  Surgeon: [name], MD;  Location: [place] [facility];  Service: Neurosurgery;  Laterality: N/A;   CERVICAL FUSION     COLONOSCOPY   08/04/2002     RMR: Normal rectum/Pancolonic diverticula/Colonic polyps as described above, biopsied and/or snared   COLONOSCOPY  60/08/2009   RMR: normal rectum/left and right sided diverticula/multiple colonic polyps. tubular adenomas. surveillance due 2014   COLONOSCOPY WITH ESOPHAGOGASTRODUODENOSCOPY (EGD) N/A 12/18/2012   Dr. [name]:   Colonic diverticulosis. Multiple colonic polyps-hyperplastic polyps and tubular adenoma. Friable anal canal hemorrhoids. EGD with chronic atrophic gastritis   ESOPHAGOGASTRODUODENOSCOPY  04/14/2002   XQJ:JHERDEYC'X' ring, status post dilation as described above/Hiatal hernia, focal antral erosions of uncertain clinical significance   ESOPHAGOGASTRODUODENOSCOPY  06/09/2009   RMR: normal esophagus s/p dilator/small hiatal hernia otherwise normal   HERNIA REPAIR Right    inguinal   I & D EXTREMITY Right 04/23/2017   Procedure: [place] HAND;  Surgeon: [name], MD;  Location: [place];  Service: Orthopedics;  Laterality: Right;   INGUINAL HERNIA REPAIR Left 04/15/2014   Procedure: LEFT INGUINAL HERNIORRHAPHY WITH MESH;  Surgeon: [name], MD;  Location: [facility];  Service: General;  Laterality: Left;   INSERTION OF MESH Left 04/15/2014   Procedure: INSERTION OF MESH;  Surgeon: [name], MD;  Location: [facility];  Service: General;  Laterality: Left;    Family History  Problem Relation Age of Onset   Cancer Mother    Dementia Father    Colon cancer Neg Hx     Social History   Socioeconomic History   Marital status: Married    Spouse name: Not on file   Number of children: 2   Years of education: hs   Highest education level: Not on file  Occupational History   Occupation: Retired    Fish farm manager: RETIRED  Tobacco Use   Smoking status: Former    Packs/day: 0.50    Years: 60.00    Pack years: 30.00    Types: Cigarettes   Smokeless tobacco: Never   Tobacco comments:    using patch  Substance and Sexual Activity   Alcohol use: No   Drug use: No   Sexual activity: Not Currently  Other Topics Concern   Not on file  Social History Narrative   Patient drinks 1-2 cups of caffeine daily.   Patient is right handed.   Social Determinants of Health   Financial Resource Strain: Low Risk    Difficulty of Paying Living Expenses: Not very hard  Food  Insecurity: Not on file  Transportation Needs: Not on file  Physical Activity: Not on file  Stress: Not on file  Social Connections: Not on file  Intimate Partner Violence: Not on file    Outpatient Medications Prior to Visit  Medication Sig Dispense Refill   albuterol (PROVENTIL HFA;VENTOLIN HFA) 108 (90 Base) MCG/ACT inhaler Inhale 2 puffs into the lungs every 4 (four) hours as needed for wheezing or shortness of breath. 1 Inhaler 0   amLODipine (NORVASC) 5 MG tablet TAKE (1) TABLET BY MOUTH AT BEDTIME. 30 tablet 2   aspirin 325 MG tablet Take 1 tablet (325 mg total) by mouth daily. 30 tablet 2   atorvastatin (LIPITOR) 40 MG tablet TAKE (1) TABLET BY MOUTH AT BEDTIME. 30 tablet 3   Cholecalciferol (VITAMIN D-3) 25 MCG (1000 UT) CAPS Take 1 capsule (  1,000 Units total) by mouth daily. 30 capsule 6   fluticasone (FLONASE) 50 MCG/ACT nasal spray USE 1 OR 2 SPRAYS IN EACH NOSTRIL EVERY DAY FOR NASAL ALLERGIES. 16 g 3   levETIRAcetam (KEPPRA) 250 MG tablet Take one tablet by mouth each morning 30 tablet 2   lisinopril (ZESTRIL) 10 MG tablet Take 1 daily with 20 mg tablet also 30 tablet 1   lisinopril (ZESTRIL) 20 MG tablet TAKE 1 TABLET BY MOUTH ONCE A DAY. TAKE WITH 10MG TABLET FOR TOTAL DOSE OF 30MG. 30 tablet 3   LORazepam (ATIVAN) 1 MG tablet Take one tablet by mouth at bed time. 30 tablet 1   lubiprostone (AMITIZA) 24 MCG capsule Take one by mouth daily 30 capsule 2   mirabegron ER (MYRBETRIQ) 25 MG TB24 tablet Take 1 tablet (25 mg total) by mouth daily. 30 tablet 2   naproxen sodium (ALEVE) 220 MG tablet Take 440 mg by mouth daily as needed.     NITROSTAT 0.4 MG SL tablet PLACE 1 TAB UNDER TONGUE EVERY 5 MIN IF NEEDED FOR CHEST PAIN. MAY USE 3 TIMES.NO RELIEF CALL 911. 25 tablet 0   omeprazole (PRILOSEC) 20 MG capsule Take one by mouth daily 30 capsule 4   PARoxetine (PAXIL) 40 MG tablet Take 1 tablet (40 mg total) by mouth daily. 30 tablet 2   Respiratory Therapy Supplies  (NEBULIZER/TUBING/MOUTHPIECE) KIT Disp one nebulizer machine, tubing set and mouthpiece kit 1 each 0   thiamine (VITAMIN B-1) 100 MG tablet Take 1 tablet (100 mg total) by mouth daily. 30 tablet 2   thiamine 100 MG tablet TAKE 1 TABLET BY MOUTH ONCE A DAY. 30 tablet 6   traZODone (DESYREL) 100 MG tablet Take 1 tablet (100 mg total) by mouth at bedtime as needed for sleep. 90 tablet 1   vitamin B-12 (CYANOCOBALAMIN) 1000 MCG tablet Take 1 tablet (1,000 mcg total) by mouth daily. 30 tablet 2   clotrimazole-betamethasone (LOTRISONE) cream APPLY 1 APPLICATION TOPICALLY TWICE DAILY. 60 g 0   nystatin (MYCOSTATIN/NYSTOP) powder APPLY TOPCIALLY TO AFFECTED AREAS 4 TIMES DAILY. 60 g 5   Zinc Oxide 16.5 % CREA 1 application to perineum and buttocks with each brief change. 1000 g 3   No facility-administered medications prior to visit.    No Known Allergies  ROS Review of Systems  Constitutional: Negative.   HENT: Negative.    Eyes: Negative.   Respiratory: Negative.    Gastrointestinal: Negative.   Genitourinary: Negative.   Skin:  Negative for rash.  Psychiatric/Behavioral:  The patient is nervous/anxious.   All other systems reviewed and are negative.    Objective:    Physical Exam Vitals and nursing note reviewed. Exam conducted with a chaperone present (caregiver).  Constitutional:      Appearance: Normal appearance.  HENT:     Head: Normocephalic.     Nose: Nose normal.     Mouth/Throat:     Mouth: Mucous membranes are moist.     Pharynx: Oropharynx is clear.  Eyes:     Conjunctiva/sclera: Conjunctivae normal.  Cardiovascular:     Rate and Rhythm: Normal rate and regular rhythm.     Pulses: Normal pulses.     Heart sounds: Normal heart sounds.  Pulmonary:     Effort: Pulmonary effort is normal.     Breath sounds: Normal breath sounds.  Abdominal:     General: Bowel sounds are normal.  Skin:    Findings: No rash.  Neurological:  Mental Status: He is alert.    BP  (!) 111/56   Pulse 73   Temp 98.2 F (36.8 C) (Temporal)   Ht _0  (1.753 m)   SpO2 97%   BMI 23.92 kg/m  Wt Readings from Last 3 Encounters:  02/11/20 162 lb (73.5 kg)  11/13/17 170 lb (77.1 kg)  10/11/17 170 lb (77.1 kg)     Health Maintenance Due  Topic Date Due   Zoster Vaccines- Shingrix (1 of 2) Never done   COVID-19 Vaccine (4 - Booster) 03/09/2020    There are no preventive care reminders to display for this patient.  Lab Results  Component Value Date   TSH 1.53 11/20/2018   Lab Results  Component Value Date   WBC 5.5 05/04/2020   HGB 12.6 (L) 05/04/2020   HCT 37.8 05/04/2020   MCV 90 05/04/2020   PLT 170 05/04/2020   Lab Results  Component Value Date   NA 141 05/04/2020   K 4.0 05/04/2020   CO2 22 05/04/2020   GLUCOSE 121 (H) 05/04/2020   BUN 16 05/04/2020   CREATININE 0.92 05/04/2020   BILITOT 0.4 05/04/2020   ALKPHOS 141 (H) 05/04/2020   AST 17 05/04/2020   ALT 7 05/04/2020   PROT 7.1 05/04/2020   ALBUMIN 3.6 05/04/2020   CALCIUM 8.9 05/04/2020   ANIONGAP 7 04/25/2017   EGFR 83 05/04/2020   Lab Results  Component Value Date   CHOL 112 05/04/2020   Lab Results  Component Value Date   HDL 61 05/04/2020   Lab Results  Component Value Date   LDLCALC 38 05/04/2020   Lab Results  Component Value Date   TRIG 54 05/04/2020   Lab Results  Component Value Date   CHOLHDL 1.8 05/04/2020   Lab Results  Component Value Date   HGBA1C 5.9 (H) 12/23/2019      Assessment & Plan:   Problem List Items Addressed This Visit       Cardiovascular and Mediastinum   Essential hypertension - Primary   Relevant Orders   CBC with Differential   Comprehensive metabolic panel   Lipid Panel     Digestive   GERD    Symptoms well controlled on  omeprazole 20 mg tablet daily.  Continue current dose and continue current dose.         Other   Protein-calorie malnutrition ([place])    Patient supplementing nutrition with ensure and has no new  concerns or weight loss.       Depression, recurrent ([place])    Symptoms well controlled on current medication, no changes to dose. Completed PHQ 9         Meds ordered this encounter  Medications   clotrimazole-betamethasone (LOTRISONE) cream    Sig: APPLY 1 APPLICATION TOPICALLY TWICE DAILY.    Dispense:  60 g    Refill:  0    Order Specific Question:   Supervising Provider    Answer:   Janora Norlander [[id number]]     Follow-up: Return in about 3 months (around 10/23/2020).    Ivy Lynn, NP

## 2020-07-23 NOTE — Patient Instructions (Signed)

## 2020-07-24 LAB — COMPREHENSIVE METABOLIC PANEL
ALT: 7 IU/L (ref 0–44)
AST: 14 IU/L (ref 0–40)
Albumin/Globulin Ratio: 1.2 (ref 1.2–2.2)
Albumin: 3.6 g/dL (ref 3.6–4.6)
Alkaline Phosphatase: 131 IU/L — ABNORMAL HIGH (ref 44–121)
BUN/Creatinine Ratio: 13 (ref 10–24)
BUN: 12 mg/dL (ref 8–27)
Bilirubin Total: 0.3 mg/dL (ref 0.0–1.2)
CO2: 24 mmol/L (ref 20–29)
Calcium: 8.7 mg/dL (ref 8.6–10.2)
Chloride: 105 mmol/L (ref 96–106)
Creatinine, Ser: 0.95 mg/dL (ref 0.76–1.27)
Globulin, Total: 3 g/dL (ref 1.5–4.5)
Glucose: 87 mg/dL (ref 65–99)
Potassium: 4.5 mmol/L (ref 3.5–5.2)
Sodium: 139 mmol/L (ref 134–144)
Total Protein: 6.6 g/dL (ref 6.0–8.5)
eGFR: 79 mL/min/{1.73_m2} (ref >59)

## 2020-07-24 LAB — CBC WITH DIFFERENTIAL/PLATELET
Basophils Absolute: 0.1 10*3/uL (ref 0.0–0.2)
Basos: 1 %
EOS (ABSOLUTE): 0.8 10*3/uL — ABNORMAL HIGH (ref 0.0–0.4)
Eos: 14 %
Hematocrit: 36.5 % — ABNORMAL LOW (ref 37.5–51.0)
Hemoglobin: 12.4 g/dL — ABNORMAL LOW (ref 13.0–17.7)
Immature Grans (Abs): 0 10*3/uL (ref 0.0–0.1)
Immature Granulocytes: 0 %
Lymphocytes Absolute: 2.5 10*3/uL (ref 0.7–3.1)
Lymphs: 41 %
MCH: 30.2 pg (ref 26.6–33.0)
MCHC: 34 g/dL (ref 31.5–35.7)
MCV: 89 fL (ref 79–97)
Monocytes Absolute: 0.5 10*3/uL (ref 0.1–0.9)
Monocytes: 8 %
Neutrophils Absolute: 2.2 10*3/uL (ref 1.4–7.0)
Neutrophils: 36 %
Platelets: 182 10*3/uL (ref 150–450)
RBC: 4.1 x10E6/uL — ABNORMAL LOW (ref 4.14–5.80)
RDW: 12.4 % (ref 11.6–15.4)
WBC: 6.2 10*3/uL (ref 3.4–10.8)

## 2020-07-24 LAB — LIPID PANEL
Chol/HDL Ratio: 1.7 ratio (ref 0.0–5.0)
Cholesterol, Total: 88 mg/dL — ABNORMAL LOW (ref 100–199)
HDL: 51 mg/dL (ref >39)
LDL Chol Calc (NIH): 25 mg/dL (ref 0–99)
Triglycerides: 47 mg/dL (ref 0–149)
VLDL Cholesterol Cal: 12 mg/dL (ref 5–40)

## 2020-07-29 ENCOUNTER — Telehealth: Payer: Self-pay | Admitting: Nurse Practitioner

## 2020-07-29 DIAGNOSIS — F5104 Psychophysiologic insomnia: Secondary | ICD-10-CM

## 2020-07-29 MED ORDER — ATORVASTATIN CALCIUM 40 MG PO TABS: ORAL_TABLET | ORAL | 5 refills | Status: DC

## 2020-07-29 MED ORDER — LISINOPRIL 10 MG PO TABS: ORAL_TABLET | ORAL | 5 refills | Status: DC

## 2020-07-29 MED ORDER — MIRABEGRON ER 25 MG PO TB24: 25.0000 mg | ORAL_TABLET | Freq: Every day | ORAL | 5 refills | Status: DC

## 2020-07-29 MED ORDER — PAROXETINE HCL 40 MG PO TABS: 40.0000 mg | ORAL_TABLET | Freq: Every day | ORAL | 5 refills | Status: DC

## 2020-07-29 MED ORDER — THIAMINE MONONITRATE 100 MG PO TABS: 100.0000 mg | ORAL_TABLET | Freq: Every day | ORAL | 5 refills | Status: DC

## 2020-07-29 MED ORDER — LUBIPROSTONE 24 MCG PO CAPS: ORAL_CAPSULE | ORAL | 5 refills | Status: DC

## 2020-07-29 MED ORDER — VITAMIN B-12 1000 MCG PO TABS: 1000.0000 ug | ORAL_TABLET | Freq: Every day | ORAL | 5 refills | Status: DC

## 2020-07-29 MED ORDER — LISINOPRIL 20 MG PO TABS: ORAL_TABLET | ORAL | 5 refills | Status: DC

## 2020-07-29 MED ORDER — LEVETIRACETAM 250 MG PO TABS: ORAL_TABLET | ORAL | 5 refills | Status: DC

## 2020-07-29 MED ORDER — OMEPRAZOLE 20 MG PO CPDR: DELAYED_RELEASE_CAPSULE | ORAL | 4 refills | Status: DC

## 2020-07-29 MED ORDER — VITAMIN D-3 25 MCG (1000 UT) PO CAPS: 1.0000 | ORAL_CAPSULE | Freq: Every day | ORAL | 5 refills | Status: DC

## 2020-07-29 MED ORDER — AMLODIPINE BESYLATE 5 MG PO TABS: ORAL_TABLET | ORAL | 5 refills | Status: DC

## 2020-07-29 MED ORDER — ASPIRIN 325 MG PO TABS: 325.0000 mg | ORAL_TABLET | Freq: Every day | ORAL | 5 refills | Status: DC

## 2020-07-29 NOTE — Addendum Note (Signed)
Addended by: Antonietta Barcelona D on: 07/29/2020 02:01 PM   Modules accepted: Orders

## 2020-07-29 NOTE — Addendum Note (Signed)
Addended by: Antonietta Barcelona D on: 07/29/2020 03:12 PM   Modules accepted: Orders

## 2020-07-29 NOTE — Telephone Encounter (Signed)
Left message to call back.  Need to know what medications he needs refilled.

## 2020-07-29 NOTE — Telephone Encounter (Signed)
Patient needs all meds that were last filled in May sent to [facility].

## 2020-07-29 NOTE — Telephone Encounter (Signed)
Fax from [facility] RF Lorazepam Pt last OV 07/23/20 Last RF 06/04/20 #30 with a RF Next OV 10/25/20

## 2020-07-30 ENCOUNTER — Other Ambulatory Visit: Payer: Self-pay | Admitting: Nurse Practitioner

## 2020-07-30 MED ORDER — LORAZEPAM 1 MG PO TABS: 1.0000 mg | ORAL_TABLET | Freq: Every day | ORAL | 1 refills | Status: DC

## 2020-08-06 DIAGNOSIS — I10 Essential (primary) hypertension: Secondary | ICD-10-CM | POA: Diagnosis not present

## 2020-08-06 DIAGNOSIS — J449 Chronic obstructive pulmonary disease, unspecified: Secondary | ICD-10-CM | POA: Diagnosis not present

## 2020-08-17 DIAGNOSIS — B351 Tinea unguium: Secondary | ICD-10-CM | POA: Diagnosis not present

## 2020-08-17 DIAGNOSIS — L110 Acquired keratosis follicularis: Secondary | ICD-10-CM | POA: Diagnosis not present

## 2020-08-17 DIAGNOSIS — I739 Peripheral vascular disease, unspecified: Secondary | ICD-10-CM | POA: Diagnosis not present

## 2020-08-31 ENCOUNTER — Ambulatory Visit (INDEPENDENT_AMBULATORY_CARE_PROVIDER_SITE_OTHER): Payer: Medicare Other

## 2020-08-31 VITALS — BP 104/65 | HR 74 | Temp 98.0°F | Ht 69.0 in | Wt 162.0 lb

## 2020-08-31 DIAGNOSIS — Z0000 Encounter for general adult medical examination without abnormal findings: Secondary | ICD-10-CM

## 2020-08-31 NOTE — Progress Notes (Signed)
Subjective:   Tarek Cravens is a 86 y.o. male who presents for Medicare Annual/Subsequent preventive examination.  Virtual Visit via Telephone Note  I connected with  [name] on 08/31/20 at 11:15 AM EDT by telephone and verified that I am speaking with the correct person using two identifiers.  Location: Patient: Home Provider: [facility] Persons participating in the virtual visit: patient/caregiver - [place]   I discussed the limitations, risks, security and privacy concerns of performing an evaluation and management service by telephone and the availability of in person appointments. The patient expressed understanding and agreed to proceed.  Interactive audio and video telecommunications were attempted between this nurse and patient, however failed, due to patient having technical difficulties OR patient did not have access to video capability.  We continued and completed visit with audio only.  Some vital signs may be absent or patient reported.   Ladajah Soltys E Minnie Legros, LPN   Review of Systems     Cardiac Risk Factors include: advanced age (>99mn, >>19women);dyslipidemia;hypertension;male gender;sedentary lifestyle;smoking/ tobacco exposure;Other (see comment), Risk factor comments: OSA (refuses CPAP), hx of stroke, atherosclerosis     Objective:    Today's Vitals   08/31/20 1123  Weight: 162 lb (73.5 kg)  Height: 5' 9"  (1.753 m)   Body mass index is 23.92 kg/m.  Advanced Directives 08/20/2019 04/22/2017 03/26/2017 03/04/2017 03/03/2017 01/06/2017 06/28/2015  Does Patient Have a Medical Advance Directive? Yes Yes Yes Yes Yes Yes Yes  Type of Advance Directive - [place] DNR (pink MOST or yellow form)  Does patient want to make changes to medical advance directive? No - Patient declined No -  Patient declined - No - Patient declined - - -  Copy of [place] Chart? - No - copy requested Yes No - copy requested - Yes No - copy requested  Would patient like information on creating a medical advance directive? - - - - - - -  Pre-existing out of facility DNR order (yellow form or pink MOST form) - - - - - - -    Current Medications (verified) Outpatient Encounter Medications as of 08/31/2020  Medication Sig   albuterol (PROVENTIL HFA;VENTOLIN HFA) 108 (90 Base) MCG/ACT inhaler Inhale 2 puffs into the lungs every 4 (four) hours as needed for wheezing or shortness of breath.   amLODipine (NORVASC) 5 MG tablet TAKE (1) TABLET BY MOUTH AT BEDTIME.   aspirin 325 MG tablet Take 1 tablet (325 mg total) by mouth daily.   atorvastatin (LIPITOR) 40 MG tablet TAKE (1) TABLET BY MOUTH AT BEDTIME.   Cholecalciferol (VITAMIN D-3) 25 MCG (1000 UT) CAPS Take 1 capsule (1,000 Units total) by mouth daily.   clotrimazole-betamethasone (LOTRISONE) cream APPLY 1 APPLICATION TOPICALLY TWICE DAILY.   fluticasone (FLONASE) 50 MCG/ACT nasal spray USE 1 OR 2 SPRAYS IN EACH NOSTRIL EVERY DAY FOR NASAL ALLERGIES.   levETIRAcetam (KEPPRA) 250 MG tablet Take one tablet by mouth each morning   lisinopril (ZESTRIL) 10 MG tablet Take 1 daily with 20 mg tablet also   lisinopril (ZESTRIL) 20 MG tablet TAKE 1 TABLET BY MOUTH ONCE A DAY. TAKE WITH 10MG TABLET FOR TOTAL DOSE OF 30MG.   LORazepam (ATIVAN) 1 MG tablet Take 1 tablet (1 mg total) by mouth at bedtime.   lubiprostone (AMITIZA) 24 MCG capsule Take one by mouth  daily   mirabegron ER (MYRBETRIQ) 25 MG TB24 tablet Take 1 tablet (25 mg total) by mouth daily.   naproxen sodium (ALEVE) 220 MG tablet Take 440 mg by mouth daily as needed.   NITROSTAT 0.4 MG SL tablet PLACE 1 TAB UNDER TONGUE EVERY 5 MIN IF NEEDED FOR CHEST PAIN. MAY USE 3 TIMES.NO RELIEF CALL 911.   omeprazole (PRILOSEC) 20 MG capsule Take one by mouth daily   PARoxetine (PAXIL) 40  MG tablet Take 1 tablet (40 mg total) by mouth daily.   Respiratory Therapy Supplies (NEBULIZER/TUBING/MOUTHPIECE) KIT Disp one nebulizer machine, tubing set and mouthpiece kit   thiamine (VITAMIN B-1) 100 MG tablet Take 1 tablet (100 mg total) by mouth daily.   thiamine 100 MG tablet TAKE 1 TABLET BY MOUTH ONCE A DAY.   traZODone (DESYREL) 100 MG tablet Take 1 tablet (100 mg total) by mouth at bedtime as needed for sleep.   vitamin B-12 (CYANOCOBALAMIN) 1000 MCG tablet Take 1 tablet (1,000 mcg total) by mouth daily.   No facility-administered encounter medications on file as of 08/31/2020.    Allergies (verified) Patient has no known allergies.   History: Past Medical History:  Diagnosis Date   Abnormality of gait 08/01/2013   Arthritis    Blindness of left eye    Posttraumatic   Carotid artery disease (HCC)    Colon polyp    Constipation    Diaphragmatic hernia    Disc disease, degenerative, cervical    Diverticulitis    ED (erectile dysfunction)    Gastroparesis    GERD (gastroesophageal reflux disease)    Glaucoma    Heart disease    Hyperlipidemia    Hypertension    Insomnia    Memory difficulty 08/04/2014   Nicotine dependence    Numbness and tingling of right arm 08/04/2014   Osteoarthritis    Overactive bladder    Radiculopathy    Shortness of breath    with activity   Sleep apnea    does not use CPAP.  Tested maybe 5- 10 years ago   Stroke [facility])    Left side weakness.   Unilateral inguinal hernia    Past Surgical History:  Procedure Laterality Date   ANTERIOR CERVICAL DECOMP/DISCECTOMY FUSION N/A 07/01/2012   Procedure: ANTERIOR CERVICAL DECOMPRESSION/DISCECTOMY FUSION CERVICAL FIVE-SIX [name] REMOVAL CERVICAL SIX-SEVEN;  Surgeon: [name], MD;  Location: [facility];  Service: Neurosurgery;  Laterality: N/A;   CERVICAL FUSION     COLONOSCOPY   08/04/2002     RMR: Normal rectum/Pancolonic diverticula/Colonic polyps as described above, biopsied and/or  snared   COLONOSCOPY  60/08/2009   RMR: normal rectum/left and right sided diverticula/multiple colonic polyps. tubular adenomas. surveillance due 2014   COLONOSCOPY WITH ESOPHAGOGASTRODUODENOSCOPY (EGD) N/A 12/18/2012   Dr. [name]:  Colonic diverticulosis. Multiple colonic polyps-hyperplastic polyps and tubular adenoma. Friable anal canal hemorrhoids. EGD with chronic atrophic gastritis   ESOPHAGOGASTRODUODENOSCOPY  04/14/2002   FBP:ZWCHENID'P' ring, status post dilation as described above/Hiatal hernia, focal antral erosions of uncertain clinical significance   ESOPHAGOGASTRODUODENOSCOPY  06/09/2009   RMR: normal esophagus s/p dilator/small hiatal hernia otherwise normal   HERNIA REPAIR Right    inguinal   I & D EXTREMITY Right 04/23/2017   Procedure: Evan HAND;  Surgeon: [name], MD;  Location: [place];  Service: Orthopedics;  Laterality: Right;   INGUINAL HERNIA REPAIR Left 04/15/2014   Procedure: LEFT INGUINAL HERNIORRHAPHY WITH MESH;  Surgeon: [name], MD;  Location: [facility];  Service: General;  Laterality: Left;   INSERTION OF MESH Left 04/15/2014   Procedure: INSERTION OF MESH;  Surgeon: [name], MD;  Location: [facility];  Service: General;  Laterality: Left;   Family History  Problem Relation Age of Onset   Cancer Mother    Dementia Father    Colon cancer Neg Hx    Social History   Socioeconomic History   Marital status: Widowed    Spouse name: Not on file   Number of children: 2   Years of education: hs   Highest education level: Not on file  Occupational History   Occupation: Retired    Fish farm manager: RETIRED  Tobacco Use   Smoking status: Former    Packs/day: 0.50    Years: 60.00    Pack years: 30.00    Types: Cigarettes   Smokeless tobacco: Never   Tobacco comments:    using patch  Substance and Sexual Activity   Alcohol use: No   Drug use: No   Sexual activity: Not Currently  Other Topics Concern   Not on file  Social  History Narrative   Patient drinks 1-2 cups of caffeine daily.   Patient is right handed.   Lives with his sister [name]   Has a nurse/ Caregiver, [name] Scales to help with bed baths, dressing, meals, etc 5 days per week   Social Determinants of Health   Financial Resource Strain: Low Risk    Difficulty of Paying Living Expenses: Not very hard  Food Insecurity: No Food Insecurity   Worried About Charity fundraiser in the Last Year: Never true   Ran Out of Food in the Last Year: Never true  Transportation Needs: No Transportation Needs   Lack of Transportation (Medical): No   Lack of Transportation (Non-Medical): No  Physical Activity: Inactive   Days of Exercise per Week: 0 days   Minutes of Exercise per Session: 0 min  Stress: No Stress Concern Present   Feeling of Stress : Not at all  Social Connections: Moderately Isolated   Frequency of Communication with Friends and Family: Twice a week   Frequency of Social Gatherings with Friends and Family: More than three times a week   Attends Religious Services: More than 4 times per year   Active Member of [employer] or Organizations: No   Attends Archivist Meetings: Never   Marital Status: Widowed    Tobacco Counseling Counseling given: Not Answered Tobacco comments: using patch   Clinical Intake:  Pre-visit preparation completed: Yes  Pain : No/denies pain     BMI - recorded: 23.92 Nutritional Status: BMI of 19-24  Normal Nutritional Risks: None Diabetes: No  How often do you need to have someone help you when you read instructions, pamphlets, or other written materials from your doctor or pharmacy?: 5 - Always  Diabetic? No  Interpreter Needed?: No  Information entered by :: Kaleiah Kutzer, LPN   Activities of Daily Living In your present state of health, do you have any difficulty performing the following activities: 08/31/2020  Hearing? N  Vision? Y  Difficulty concentrating or making decisions?  Y  Walking or climbing stairs? Y  Dressing or bathing? Y  Doing errands, shopping? Y  Preparing Food and eating ? Y  Using the Toilet? Y  In the past six months, have you accidently leaked urine? Y  Do you have problems with loss of bowel control? Y  Managing your Medications? Darreld Mclean  Managing your Finances? Y  Housekeeping or managing your Housekeeping? Y  Some recent data might be hidden    Patient Care Team: Ivy Lynn, NP as PCP - General (Nurse Practitioner) Daneil Dolin, MD as Consulting Physician (Gastroenterology) Edythe Clarity, [facility] as Pharmacist (Pharmacist)  Indicate any recent Medical Services you may have received from other than [facility] providers in the past year (date may be approximate).     Assessment:   This is a routine wellness examination for Andri.  Hearing/Vision screen Hearing Screening - Comments:: Denies hearing difficulties  Vision Screening - Comments:: Wears eyeglasses - still has trouble seeing, but refuses to see eye doctor  Dietary issues and exercise activities discussed: Current Exercise Habits: The patient does not participate in regular exercise at present, Exercise limited by: neurologic condition(s);orthopedic condition(s);respiratory conditions(s);psychological condition(s)   Goals Addressed             This Visit's Progress    DIET - INCREASE WATER INTAKE       Aim for 6-8 glasses per day       Depression Screen PHQ 2/9 Scores 08/31/2020 07/23/2020 04/21/2020 03/31/2020 03/02/2020 08/20/2019 12/30/2018  PHQ - 2 Score 2 4 4  - 0 4 4  PHQ- 9 Score 9 8 12  - - 16 14  Exception Documentation - - - Patient refusal - - -    Fall Risk Fall Risk  08/31/2020 07/23/2020 03/31/2020 03/02/2020 08/20/2019  Falls in the past year? 1 0 Exclusion - non ambulatory 0 0  Number falls in past yr: 1 - - - -  Injury with Fall? 0 - - - -  Risk for fall due to : History of fall(s);Impaired mobility;Impaired vision;Medication side effect - - - Impaired  balance/gait;Impaired mobility;Medication side effect;Mental status change;History of fall(s)  Follow up Education provided;Falls prevention discussed - - - Falls evaluation completed    FALL RISK PREVENTION PERTAINING TO THE HOME:  Any stairs in or around the home? No  If so, are there any without handrails? No  Home free of loose throw rugs in walkways, pet beds, electrical cords, etc? Yes  Adequate lighting in your home to reduce risk of falls? Yes   ASSISTIVE DEVICES UTILIZED TO PREVENT FALLS:  Life alert? Yes  Use of a cane, walker or w/c? Yes  Grab bars in the bathroom? No  Shower chair or bench in shower? No  not enough room - his caregiver gives him a bed bath in his wheelchair Elevated toilet seat or a handicapped toilet? No   TIMED UP AND GO:  Was the test performed? No . Telephonic visit  Cognitive Function: Cognitive status assessed by direct observation. Patient has current diagnosis of cognitive impairment. Patient is supposed to be followed by neurology for ongoing assessment, but he refuses to f/u. Patient is unable to complete screening 6CIT or MMSE.   MMSE - Mini Mental State Exam 08/04/2014  Orientation to time 5  Orientation to Place 4  Registration 3  Attention/ Calculation 2  Recall 2  Language- name 2 objects 2  Language- repeat 1  Language- follow 3 step command 3  Language- read & follow direction 1  Write a sentence 1  Copy design 0  Total score 24        Immunizations Immunization History  Administered Date(s) Administered   Fluad Quad(high Dose 65+) 10/08/2018, 11/11/2019   Influenza, High Dose Seasonal PF 09/18/2017   Influenza,inj,Quad PF,6+ Mos 10/02/2016   Moderna Sars-Covid-2 Vaccination 03/27/2019, 04/18/2019,  12/10/2019, 06/02/2020   Pneumococcal Conjugate-13 02/02/2017   Pneumococcal Polysaccharide-23 11/14/1995, 08/20/2019   Tdap 04/22/2017, 04/25/2017    TDAP status: Up to date  Flu Vaccine status: Up to date  Pneumococcal  vaccine status: Up to date  Covid-19 vaccine status: Completed vaccines  Qualifies for Shingles Vaccine? Yes   Zostavax completed No   Shingrix Completed?: No.    Education has been provided regarding the importance of this vaccine. Patient has been advised to call insurance company to determine out of pocket expense if they have not yet received this vaccine. Advised may also receive vaccine at local pharmacy or Health Dept. Verbalized acceptance and understanding.  Screening Tests Health Maintenance  Topic Date Due   Zoster Vaccines- Shingrix (1 of 2) Never done   INFLUENZA VACCINE  08/02/2020   COVID-19 Vaccine (5 - Booster) 10/02/2020   TETANUS/TDAP  04/26/2027   PNA vac Low Risk Adult  Completed   HPV VACCINES  Aged Out    Health Maintenance  Health Maintenance Due  Topic Date Due   Zoster Vaccines- Shingrix (1 of 2) Never done   INFLUENZA VACCINE  08/02/2020    Colorectal cancer screening: No longer required.   Lung Cancer Screening: (Low Dose CT Chest recommended if Age 37-80 years, 30 pack-year currently smoking OR have quit w/in 15years.) does not qualify.   Additional Screening:  Hepatitis C Screening: does not qualify  Vision Screening: Recommended annual ophthalmology exams for early detection of glaucoma and other disorders of the eye. Is the patient up to date with their annual eye exam?  No  Who is the provider or what is the name of the office in which the patient attends annual eye exams? Unknown - refuses to see eye doctor If pt is not established with a provider, would they like to be referred to a provider to establish care? No .   Dental Screening: Recommended annual dental exams for proper oral hygiene  Community Resource Referral / Chronic Care Management: CRR required this visit?  No   CCM required this visit?  No      Plan:     I have personally reviewed and noted the following in the patient's chart:   Medical and social history Use of  alcohol, tobacco or illicit drugs  Current medications and supplements including opioid prescriptions. Patient is not currently taking opioid prescriptions. Functional ability and status Nutritional status Physical activity Advanced directives List of other physicians Hospitalizations, surgeries, and ER visits in previous 12 months Vitals Screenings to include cognitive, depression, and falls Referrals and appointments  In addition, I have reviewed and discussed with patient certain preventive protocols, quality metrics, and best practice recommendations. A written personalized care plan for preventive services as well as general preventive health recommendations were provided to patient.     Sandrea Hammond, LPN   4/49/2010   Nurse Notes: None

## 2020-08-31 NOTE — Patient Instructions (Signed)
[name] , Thank you for taking time to come for your Medicare Wellness Visit. I appreciate your ongoing commitment to your health goals. Please review the following plan we discussed and let me know if I can assist you in the future.   Screening recommendations/referrals: Colonoscopy: Done 12/18/2012 - Repeat in 10 years Recommended yearly ophthalmology/optometry visit for glaucoma screening and checkup Recommended yearly dental visit for hygiene and checkup  Vaccinations: Influenza vaccine: Done 11/11/2019 - Repeat annually Pneumococcal vaccine: Done 02/02/2017 & 08/20/2019 Tdap vaccine: Done 04/25/2017 - Repeat in 10 years Shingles vaccine: Due. Shingrix discussed. Please contact your pharmacy for coverage information.     Covid-19: Done 03/27/19, 04/18/19, 12/10/19, & 06/02/2020  Advanced directives: in chart  Conditions/risks identified: Work on fall prevention measures -try to do seated exercises, leg and arm raises, etc. Aim for 6-8 glasses of water daily - supplement your diet with Boost or Ensure when you don't have an appetite.  Next appointment: Follow up in one year for your annual wellness visit.   Preventive Care 27 Years and Older, Male  Preventive care refers to lifestyle choices and visits with your health care provider that can promote health and wellness. What does preventive care include? A yearly physical exam. This is also called an annual well check. Dental exams once or twice a year. Routine eye exams. Ask your health care provider how often you should have your eyes checked. Personal lifestyle choices, including: Daily care of your teeth and gums. Regular physical activity. Eating a healthy diet. Avoiding tobacco and drug use. Limiting alcohol use. Practicing safe sex. Taking low doses of aspirin every day. Taking vitamin and mineral supplements as recommended by your health care provider. What happens during an annual well check? The services and screenings done  by your health care provider during your annual well check will depend on your age, overall health, lifestyle risk factors, and family history of disease. Counseling  Your health care provider may ask you questions about your: Alcohol use. Tobacco use. Drug use. Emotional well-being. Home and relationship well-being. Sexual activity. Eating habits. History of falls. Memory and ability to understand (cognition). Work and work Statistician. Screening  You may have the following tests or measurements: Height, weight, and BMI. Blood pressure. Lipid and cholesterol levels. These may be checked every 5 years, or more frequently if you are over 23 years old. Skin check. Lung cancer screening. You may have this screening every year starting at age 17 if you have a 30-pack-year history of smoking and currently smoke or have quit within the past 15 years. Fecal occult blood test (FOBT) of the stool. You may have this test every year starting at age 26. Flexible sigmoidoscopy or colonoscopy. You may have a sigmoidoscopy every 5 years or a colonoscopy every 10 years starting at age 37. Prostate cancer screening. Recommendations will vary depending on your family history and other risks. Hepatitis C blood test. Hepatitis B blood test. Sexually transmitted disease (STD) testing. Diabetes screening. This is done by checking your blood sugar (glucose) after you have not eaten for a while (fasting). You may have this done every 1-3 years. Abdominal aortic aneurysm (AAA) screening. You may need this if you are a current or former smoker. Osteoporosis. You may be screened starting at age 28 if you are at high risk. Talk with your health care provider about your test results, treatment options, and if necessary, the need for more tests. Vaccines  Your health care provider may recommend  certain vaccines, such as: Influenza vaccine. This is recommended every year. Tetanus, diphtheria, and acellular  pertussis (Tdap, Td) vaccine. You may need a Td booster every 10 years. Zoster vaccine. You may need this after age 89. Pneumococcal 13-valent conjugate (PCV13) vaccine. One dose is recommended after age 59. Pneumococcal polysaccharide (PPSV23) vaccine. One dose is recommended after age 90. Talk to your health care provider about which screenings and vaccines you need and how often you need them. This information is not intended to replace advice given to you by your health care provider. Make sure you discuss any questions you have with your health care provider. Document Released: 01/15/2015 Document Revised: 09/08/2015 Document Reviewed: 10/20/2014 Elsevier Interactive Patient Education  2017 [employer] Prevention in the Home Falls can cause injuries. They can happen to people of all ages. There are many things you can do to make your home safe and to help prevent falls. What can I do on the outside of my home? Regularly fix the edges of walkways and driveways and fix any cracks. Remove anything that might make you trip as you walk through a door, such as a raised step or threshold. Trim any bushes or trees on the path to your home. Use bright outdoor lighting. Clear any walking paths of anything that might make someone trip, such as rocks or tools. Regularly check to see if handrails are loose or broken. Make sure that both sides of any steps have handrails. Any raised decks and porches should have guardrails on the edges. Have any leaves, snow, or ice cleared regularly. Use sand or salt on walking paths during winter. Clean up any spills in your garage right away. This includes oil or grease spills. What can I do in the bathroom? Use night lights. Install grab bars by the toilet and in the tub and shower. Do not use towel bars as grab bars. Use non-skid mats or decals in the tub or shower. If you need to sit down in the shower, use a plastic, non-slip stool. Keep the floor  dry. Clean up any water that spills on the floor as soon as it happens. Remove soap buildup in the tub or shower regularly. Attach bath mats securely with double-sided non-slip rug tape. Do not have throw rugs and other things on the floor that can make you trip. What can I do in the bedroom? Use night lights. Make sure that you have a light by your bed that is easy to reach. Do not use any sheets or blankets that are too big for your bed. They should not hang down onto the floor. Have a firm chair that has side arms. You can use this for support while you get dressed. Do not have throw rugs and other things on the floor that can make you trip. What can I do in the kitchen? Clean up any spills right away. Avoid walking on wet floors. Keep items that you use a lot in easy-to-reach places. If you need to reach something above you, use a strong step stool that has a grab bar. Keep electrical cords out of the way. Do not use floor polish or wax that makes floors slippery. If you must use wax, use non-skid floor wax. Do not have throw rugs and other things on the floor that can make you trip. What can I do with my stairs? Do not leave any items on the stairs. Make sure that there are handrails on both sides of the  stairs and use them. Fix handrails that are broken or loose. Make sure that handrails are as long as the stairways. Check any carpeting to make sure that it is firmly attached to the stairs. Fix any carpet that is loose or worn. Avoid having throw rugs at the top or bottom of the stairs. If you do have throw rugs, attach them to the floor with carpet tape. Make sure that you have a light switch at the top of the stairs and the bottom of the stairs. If you do not have them, ask someone to add them for you. What else can I do to help prevent falls? Wear shoes that: Do not have high heels. Have rubber bottoms. Are comfortable and fit you well. Are closed at the toe. Do not wear  sandals. If you use a stepladder: Make sure that it is fully opened. Do not climb a closed stepladder. Make sure that both sides of the stepladder are locked into place. Ask someone to hold it for you, if possible. Clearly mark and make sure that you can see: Any grab bars or handrails. First and last steps. Where the edge of each step is. Use tools that help you move around (mobility aids) if they are needed. These include: Canes. Walkers. Scooters. Crutches. Turn on the lights when you go into a dark area. Replace any light bulbs as soon as they burn out. Set up your furniture so you have a clear path. Avoid moving your furniture around. If any of your floors are uneven, fix them. If there are any pets around you, be aware of where they are. Review your medicines with your doctor. Some medicines can make you feel dizzy. This can increase your chance of falling. Ask your doctor what other things that you can do to help prevent falls. This information is not intended to replace advice given to you by your health care provider. Make sure you discuss any questions you have with your health care provider. Document Released: 10/15/2008 Document Revised: 05/27/2015 Document Reviewed: 01/23/2014 Elsevier Interactive Patient Education  2017 [employer].

## 2020-09-09 DIAGNOSIS — I10 Essential (primary) hypertension: Secondary | ICD-10-CM | POA: Diagnosis not present

## 2020-09-09 DIAGNOSIS — J449 Chronic obstructive pulmonary disease, unspecified: Secondary | ICD-10-CM | POA: Diagnosis not present

## 2020-09-29 ENCOUNTER — Other Ambulatory Visit: Payer: Self-pay | Admitting: Nurse Practitioner

## 2020-09-29 MED ORDER — LORAZEPAM 1 MG PO TABS: 1.0000 mg | ORAL_TABLET | Freq: Every day | ORAL | 0 refills | Status: DC

## 2020-09-29 NOTE — Telephone Encounter (Signed)
  Prescription Request  09/29/2020  Is this a "Controlled Substance" medicine? yes Have you seen your PCP in the last 2 weeks? no If YES, route message to pool  -  If NO, patient needs to be scheduled for appointment.  What is the name of the medication or equipment? Lorazepam  Have you contacted your pharmacy to request a refill? yes  Which pharmacy would you like this sent to?  [facility]   Patient notified that their request is being sent to the clinical staff for review and that they should receive a response within 2 business days.    [facility] pt.  Homehealth pt.  Please call caregiver.

## 2020-10-11 DIAGNOSIS — I10 Essential (primary) hypertension: Secondary | ICD-10-CM | POA: Diagnosis not present

## 2020-10-11 DIAGNOSIS — M4800 Spinal stenosis, site unspecified: Secondary | ICD-10-CM | POA: Diagnosis not present

## 2020-10-11 DIAGNOSIS — J449 Chronic obstructive pulmonary disease, unspecified: Secondary | ICD-10-CM | POA: Diagnosis not present

## 2020-10-25 ENCOUNTER — Ambulatory Visit (INDEPENDENT_AMBULATORY_CARE_PROVIDER_SITE_OTHER): Payer: Medicare Other | Admitting: Nurse Practitioner

## 2020-10-25 ENCOUNTER — Other Ambulatory Visit: Payer: Self-pay

## 2020-10-25 ENCOUNTER — Encounter: Payer: Self-pay | Admitting: Nurse Practitioner

## 2020-10-25 VITALS — BP 138/86 | HR 92 | Temp 97.4°F

## 2020-10-25 DIAGNOSIS — R339 Retention of urine, unspecified: Secondary | ICD-10-CM | POA: Insufficient documentation

## 2020-10-25 DIAGNOSIS — F339 Major depressive disorder, recurrent, unspecified: Secondary | ICD-10-CM | POA: Diagnosis not present

## 2020-10-25 DIAGNOSIS — Z23 Encounter for immunization: Secondary | ICD-10-CM

## 2020-10-25 DIAGNOSIS — K219 Gastro-esophageal reflux disease without esophagitis: Secondary | ICD-10-CM | POA: Diagnosis not present

## 2020-10-25 DIAGNOSIS — I10 Essential (primary) hypertension: Secondary | ICD-10-CM | POA: Diagnosis not present

## 2020-10-25 NOTE — Progress Notes (Signed)
Established Patient Office Visit  Subjective:  Patient ID: [name], male    DOB: 1934/01/16  Age: 86 y.o. MRN: [id number]  CC:  Chief Complaint  Patient presents with   Medical Management of Chronic Issues    HPI [name]  presents for follow up of hypertension. Patient was diagnosed in 09/09/2008 the patient is tolerating the medication well without side effects. Compliance with treatment has been good; including taking medication as directed , maintains a healthy diet and regular exercise regimen , and following up as directed.   GERD, Follow up:  The patient was last seen for GERD 3 months ago. Changes made since that visit include no changes made 3 months ago.  He reports good compliance with treatment. (OTC Tums) He is not having side effects. [name]  He IS experiencing no new symptoms . He is NOT experiencing abdominal bloating, belching, belching and eructation, bilious reflux, chest pain, choking on food, or cough   Depression, Follow-up  He  was last seen for this 3 months ago. Changes made at last visit include continued Paxil 40 mg tablet by mouth daily.   He reports good compliance with treatment. He is not having side effects.   He reports good tolerance of treatment. Current symptoms include:  No new symptoms He feels he is Improved since last visit.  Depression screen [name] 2/9 10/25/2020 08/31/2020 07/23/2020  Decreased Interest 0 1 2  Down, Depressed, Hopeless 0 1 2  PHQ - 2 Score 0 2 4  Altered sleeping - 1 1  Tired, decreased energy - 1 1  Change in appetite - 1 0  Feeling bad or failure about yourself  - 0 0  Trouble concentrating - 2 1  Moving slowly or fidgety/restless - 2 1  Suicidal thoughts - 0 0  PHQ-9 Score - 9 8  Difficult doing work/chores - Somewhat difficult Somewhat difficult  Some recent data might be hidden      Enlarged Prostate.  Caregiver is reporting that patient may have an enlarged prostate due to recent urinary retention and  low urinary stream.  No fever nausea vomiting, back pain or urinary tract infection associated with new symptoms.    Past Medical History:  Diagnosis Date   Abnormality of gait 08/01/2013   Arthritis    Blindness of left eye    Posttraumatic   Carotid artery disease (HCC)    Colon polyp    Constipation    Diaphragmatic hernia    Disc disease, degenerative, cervical    Diverticulitis    ED (erectile dysfunction)    Gastroparesis    GERD (gastroesophageal reflux disease)    Glaucoma    Heart disease    Hyperlipidemia    Hypertension    Insomnia    Memory difficulty 08/04/2014   Nicotine dependence    Numbness and tingling of right arm 08/04/2014   Osteoarthritis    Overactive bladder    Radiculopathy    Shortness of breath    with activity   Sleep apnea    does not use CPAP.  Tested maybe 5- 10 years ago   Stroke [facility])    Left side weakness.   Unilateral inguinal hernia     Past Surgical History:  Procedure Laterality Date   ANTERIOR CERVICAL DECOMP/DISCECTOMY FUSION N/A 07/01/2012   Procedure: ANTERIOR CERVICAL DECOMPRESSION/DISCECTOMY FUSION CERVICAL FIVE-SIX [name] REMOVAL CERVICAL SIX-SEVEN;  Surgeon: [name], Vaughan;  Location: [facility];  Service: Neurosurgery;  Laterality: N/A;  CERVICAL FUSION     COLONOSCOPY   08/04/2002     RMR: Normal rectum/Pancolonic diverticula/Colonic polyps as described above, biopsied and/or snared   COLONOSCOPY  60/08/2009   RMR: normal rectum/left and right sided diverticula/multiple colonic polyps. tubular adenomas. surveillance due 2014   COLONOSCOPY WITH ESOPHAGOGASTRODUODENOSCOPY (EGD) N/A 12/18/2012   Dr. Gala Vaughan:  Colonic diverticulosis. Multiple colonic polyps-hyperplastic polyps and tubular adenoma. Friable anal canal hemorrhoids. EGD with chronic atrophic gastritis   ESOPHAGOGASTRODUODENOSCOPY  04/14/2002   EYC:[id number]' ring, status post dilation as described above/Hiatal hernia, focal antral erosions of uncertain  clinical significance   ESOPHAGOGASTRODUODENOSCOPY  06/09/2009   RMR: normal esophagus s/p dilator/small hiatal hernia otherwise normal   HERNIA REPAIR Right    inguinal   I & D EXTREMITY Right 04/23/2017   Procedure: [place] HAND;  Surgeon: [name];  Location: [facility];  Service: Orthopedics;  Laterality: Right;   INGUINAL HERNIA REPAIR Left 04/15/2014   Procedure: LEFT INGUINAL HERNIORRHAPHY WITH MESH;  Surgeon: [name];  Location: [facility];  Service: General;  Laterality: Left;   INSERTION OF MESH Left 04/15/2014   Procedure: INSERTION OF MESH;  Surgeon: [name];  Location: [facility];  Service: General;  Laterality: Left;    Family History  Problem Relation Age of Onset   Cancer Mother    Dementia Father    Colon cancer Neg Hx     Social History   Socioeconomic History   Marital status: Widowed    Spouse name: Not on file   Number of children: 2   Years of education: hs   Highest education level: Not on file  Occupational History   Occupation: Retired    Fish farm manager: RETIRED  Tobacco Use   Smoking status: Every Day    Packs/day: 0.25    Years: 60.00    Pack years: 15.00    Types: Cigarettes   Smokeless tobacco: Never   Tobacco comments:    using patch  Vaping Use   Vaping Use: Never used  Substance and Sexual Activity   Alcohol use: No   Drug use: No   Sexual activity: Not Currently  Other Topics Concern   Not on file  Social History Narrative   Patient drinks 1-2 cups of caffeine daily.   Patient is right handed.   Lives with his sister [name]   Has a nurse/ Caregiver, [name] Scales to help with bed baths, dressing, meals, etc 5 days per week   Social Determinants of Health   Financial Resource Strain: Low Risk    Difficulty of Paying Living Expenses: Not very hard  Food Insecurity: No Food Insecurity   Worried About Charity fundraiser in the Last Year: Never true   Ran Out of Food in the Last Year:  Never true  Transportation Needs: No Transportation Needs   Lack of Transportation (Medical): No   Lack of Transportation (Non-Medical): No  Physical Activity: Inactive   Days of Exercise per Week: 0 days   Minutes of Exercise per Session: 0 min  Stress: No Stress Concern Present   Feeling of Stress : Not at all  Social Connections: Moderately Isolated   Frequency of Communication with Friends and Family: Twice a week   Frequency of Social Gatherings with Friends and Family: More than three times a week   Attends Religious Services: More than 4 times per year   Active Member of Clubs or Organizations: No   Attends [employer]  or Organization Meetings: Never   Marital Status: Widowed  Human resources officer Violence: Not At Risk   Fear of Current or Ex-Partner: No   Emotionally Abused: No   Physically Abused: No   Sexually Abused: No    Outpatient Medications Prior to Visit  Medication Sig Dispense Refill   albuterol (PROVENTIL HFA;VENTOLIN HFA) 108 (90 Base) MCG/ACT inhaler Inhale 2 puffs into the lungs every 4 (four) hours as needed for wheezing or shortness of breath. 1 Inhaler 0   amLODipine (NORVASC) 5 MG tablet TAKE (1) TABLET BY MOUTH AT BEDTIME. 30 tablet 5   aspirin 325 MG tablet Take 1 tablet (325 mg total) by mouth daily. 30 tablet 5   atorvastatin (LIPITOR) 40 MG tablet TAKE (1) TABLET BY MOUTH AT BEDTIME. 30 tablet 5   Cholecalciferol (VITAMIN D-3) 25 MCG (1000 UT) CAPS Take 1 capsule (1,000 Units total) by mouth daily. 30 capsule 5   clotrimazole-betamethasone (LOTRISONE) cream APPLY 1 APPLICATION TOPICALLY TWICE DAILY. 60 g 0   fluticasone (FLONASE) 50 MCG/ACT nasal spray USE 1 OR 2 SPRAYS IN EACH NOSTRIL EVERY DAY FOR NASAL ALLERGIES. 16 g 3   levETIRAcetam (KEPPRA) 250 MG tablet Take one tablet by mouth each morning 30 tablet 5   lisinopril (ZESTRIL) 10 MG tablet Take 1 daily with 20 mg tablet also 30 tablet 5   lisinopril (ZESTRIL) 20 MG tablet TAKE 1 TABLET BY MOUTH ONCE A DAY.  TAKE WITH 10MG TABLET FOR TOTAL DOSE OF 30MG. 30 tablet 5   LORazepam (ATIVAN) 1 MG tablet Take 1 tablet (1 mg total) by mouth at bedtime. 30 tablet 0   lubiprostone (AMITIZA) 24 MCG capsule Take one by mouth daily 30 capsule 5   mirabegron ER (MYRBETRIQ) 25 MG TB24 tablet Take 1 tablet (25 mg total) by mouth daily. 30 tablet 5   naproxen sodium (ALEVE) 220 MG tablet Take 440 mg by mouth daily as needed.     NITROSTAT 0.4 MG SL tablet PLACE 1 TAB UNDER TONGUE EVERY 5 MIN IF NEEDED FOR CHEST PAIN. MAY USE 3 TIMES.NO RELIEF CALL 911. 25 tablet 0   omeprazole (PRILOSEC) 20 MG capsule Take one by mouth daily 30 capsule 4   PARoxetine (PAXIL) 40 MG tablet Take 1 tablet (40 mg total) by mouth daily. 30 tablet 5   Respiratory Therapy Supplies (NEBULIZER/TUBING/MOUTHPIECE) KIT Disp one nebulizer machine, tubing set and mouthpiece kit 1 each 0   thiamine (VITAMIN B-1) 100 MG tablet Take 1 tablet (100 mg total) by mouth daily. 30 tablet 5   thiamine 100 MG tablet TAKE 1 TABLET BY MOUTH ONCE A DAY. 30 tablet 6   traZODone (DESYREL) 100 MG tablet Take 1 tablet (100 mg total) by mouth at bedtime as needed for sleep. 90 tablet 1   vitamin B-12 (CYANOCOBALAMIN) 1000 MCG tablet Take 1 tablet (1,000 mcg total) by mouth daily. 30 tablet 5   No facility-administered medications prior to visit.    No Known Allergies  ROS Review of Systems  Constitutional: Negative.   HENT: Negative.    Eyes: Negative.   Respiratory: Negative.    Gastrointestinal: Negative.   Musculoskeletal: Negative.   Skin:  Negative for rash.  Neurological: Negative.   All other systems reviewed and are negative.    Objective:    Physical Exam Vitals and nursing note reviewed.  Constitutional:      Appearance: Normal appearance.  HENT:     Head: Normocephalic.     Right Ear: Ear canal  and external ear normal.     Left Ear: Ear canal and external ear normal.     Nose: Nose normal.     Mouth/Throat:     Mouth: Mucous  membranes are moist.     Pharynx: Oropharynx is clear.  Eyes:     Conjunctiva/sclera: Conjunctivae normal.  Cardiovascular:     Pulses: Normal pulses.  Pulmonary:     Effort: Pulmonary effort is normal.     Breath sounds: Normal breath sounds.  Abdominal:     General: Bowel sounds are normal.  Skin:    General: Skin is warm.     Findings: No rash.  Neurological:     Mental Status: He is alert and oriented to person, place, and time.  Psychiatric:        Mood and Affect: Mood normal.        Behavior: Behavior normal.        Thought Content: Thought content normal.    BP 138/86   Pulse 92   Temp (!) 97.4 F (36.3 C) (Temporal)   SpO2 96%  Wt Readings from Last 3 Encounters:  08/31/20 162 lb (73.5 kg)  02/11/20 162 lb (73.5 kg)  11/13/17 170 lb (77.1 kg)     Health Maintenance Due  Topic Date Due   Zoster Vaccines- Shingrix (1 of 2) Never done   COVID-19 Vaccine (5 - Booster) 07/28/2020    There are no preventive care reminders to display for this patient.  Lab Results  Component Value Date   TSH 1.53 11/20/2018   Lab Results  Component Value Date   WBC 6.2 07/23/2020   HGB 12.4 (L) 07/23/2020   HCT 36.5 (L) 07/23/2020   MCV 89 07/23/2020   PLT 182 07/23/2020   Lab Results  Component Value Date   NA 139 07/23/2020   K 4.5 07/23/2020   CO2 24 07/23/2020   GLUCOSE 87 07/23/2020   BUN 12 07/23/2020   CREATININE 0.95 07/23/2020   BILITOT 0.3 07/23/2020   ALKPHOS 131 (H) 07/23/2020   AST 14 07/23/2020   ALT 7 07/23/2020   PROT 6.6 07/23/2020   ALBUMIN 3.6 07/23/2020   CALCIUM 8.7 07/23/2020   ANIONGAP 7 04/25/2017   EGFR 79 07/23/2020   Lab Results  Component Value Date   CHOL 88 (L) 07/23/2020   Lab Results  Component Value Date   HDL 51 07/23/2020   Lab Results  Component Value Date   LDLCALC 25 07/23/2020   Lab Results  Component Value Date   TRIG 47 07/23/2020   Lab Results  Component Value Date   CHOLHDL 1.7 07/23/2020   Lab  Results  Component Value Date   HGBA1C 5.9 (H) 12/23/2019      Assessment & Plan:   Problem List Items Addressed This Visit       Cardiovascular and Mediastinum   Essential hypertension - Primary    Hypertension well-controlled on current medication no changes necessary.  Continue low-sodium diet follow-up in 3 months.  Labs completed CBC, CMP lipid panel.      Relevant Orders   CBC with Differential   Comprehensive metabolic panel   Lipid Panel   PSA     Digestive   GERD    No new symptoms.  Follow-up in 3 months.        Genitourinary   Urinary retention    Symptoms are new for patient in the past few weeks.  Caregiver reports patient is not urinating all  through the night and probably has 1 wet diaper during the day.  Completed PSA results pending.  Patient may need urology referral.  Education provided printed handouts given.        Other   Depression, recurrent ([place])    No new symptoms.  Continue current dose follow-up as needed.      Other Visit Diagnoses     Need for immunization against influenza       Relevant Orders   Flu Vaccine QUAD High Dose(Fluad) (Completed)       No orders of the defined types were placed in this encounter.   Follow-up: Return in about 3 months (around 01/25/2021).    Ivy Lynn, NP

## 2020-10-25 NOTE — Assessment & Plan Note (Signed)
Hypertension well-controlled on current medication no changes necessary.  Continue low-sodium diet follow-up in 3 months.  Labs completed CBC, CMP lipid panel.

## 2020-10-25 NOTE — Assessment & Plan Note (Signed)
No new symptoms.  Continue current dose follow-up as needed.

## 2020-10-25 NOTE — Assessment & Plan Note (Signed)
No new symptoms.  Follow-up in 3 months.

## 2020-10-25 NOTE — Assessment & Plan Note (Signed)
Symptoms are new for patient in the past few weeks.  Caregiver reports patient is not urinating all through the night and probably has 1 wet diaper during the day.  Completed PSA results pending.  Patient may need urology referral.  Education provided printed handouts given.

## 2020-10-25 NOTE — Patient Instructions (Signed)
Gastroesophageal Reflux Disease, Adult Gastroesophageal reflux (GER) happens when acid from the stomach flows up into the tube that connects the mouth and the stomach (esophagus). Normally, food travels down the esophagus and stays in the stomach to be digested. With GER, food and stomach acid sometimes move back up into the esophagus. You may have a disease called gastroesophageal reflux disease (GERD) if the reflux: Happens often. Causes frequent or very bad symptoms. Causes problems such as damage to the esophagus. When this happens, the esophagus becomes sore and swollen. Over time, GERD can make small holes (ulcers) in the lining of the esophagus. What are the causes? This condition is caused by a problem with the muscle between the esophagus and the stomach. When this muscle is weak or not normal, it does not close properly to keep food and acid from coming back up from the stomach. The muscle can be weak because of: Tobacco use. Pregnancy. Having a certain type of hernia (hiatal hernia). Alcohol use. Certain foods and drinks, such as coffee, chocolate, onions, and peppermint. What increases the risk? Being overweight. Having a disease that affects your connective tissue. Taking NSAIDs, such a ibuprofen. What are the signs or symptoms? Heartburn. Difficult or painful swallowing. The feeling of having a lump in the throat. A bitter taste in the mouth. Bad breath. Having a lot of saliva. Having an upset or bloated stomach. Burping. Chest pain. Different conditions can cause chest pain. Make sure you see your doctor if you have chest pain. Shortness of breath or wheezing. A long-term cough or a cough at night. Wearing away of the surface of teeth (tooth enamel). Weight loss. How is this treated? Making changes to your diet. Taking medicine. Having surgery. Treatment will depend on how bad your symptoms are. Follow these instructions at home: Eating and drinking  Follow a  diet as told by your doctor. You may need to avoid foods and drinks such as: Coffee and tea, with or without caffeine. Drinks that contain alcohol. Energy drinks and sports drinks. Bubbly (carbonated) drinks or sodas. Chocolate and cocoa. Peppermint and mint flavorings. Garlic and onions. Horseradish. Spicy and acidic foods. These include peppers, chili powder, curry powder, vinegar, hot sauces, and BBQ sauce. Citrus fruit juices and citrus fruits, such as oranges, lemons, and limes. Tomato-based foods. These include red sauce, chili, salsa, and pizza with red sauce. Fried and fatty foods. These include donuts, french fries, potato chips, and high-fat dressings. High-fat meats. These include hot dogs, rib eye steak, sausage, ham, and bacon. High-fat dairy items, such as whole milk, butter, and cream cheese. Eat small meals often. Avoid eating large meals. Avoid drinking large amounts of liquid with your meals. Avoid eating meals during the 2-3 hours before bedtime. Avoid lying down right after you eat. Do not exercise right after you eat. Lifestyle  Do not smoke or use any products that contain nicotine or tobacco. If you need help quitting, ask your doctor. Try to lower your stress. If you need help doing this, ask your doctor. If you are overweight, lose an amount of weight that is healthy for you. Ask your doctor about a safe weight loss goal. General instructions Pay attention to any changes in your symptoms. Take over-the-counter and prescription medicines only as told by your doctor. Do not take aspirin, ibuprofen, or other NSAIDs unless your doctor says it is okay. Wear loose clothes. Do not wear anything tight around your waist. Raise (elevate) the head of your bed about 6  inches (15 cm). You may need to use a wedge to do this. Avoid bending over if this makes your symptoms worse. Keep all follow-up visits. Contact a doctor if: You have new symptoms. You lose weight and you  do not know why. You have trouble swallowing or it hurts to swallow. You have wheezing or a cough that keeps happening. You have a hoarse voice. Your symptoms do not get better with treatment. Get help right away if: You have sudden pain in your arms, neck, jaw, teeth, or back. You suddenly feel sweaty, dizzy, or light-headed. You have chest pain or shortness of breath. You vomit and the vomit is green, yellow, or black, or it looks like blood or coffee grounds. You faint. Your poop (stool) is red, bloody, or black. You cannot swallow, drink, or eat. These symptoms may represent a serious problem that is an emergency. Do not wait to see if the symptoms will go away. Get medical help right away. Call your local emergency services (911 in the [place] Do not drive yourself to the hospital. Summary If a person has gastroesophageal reflux disease (GERD), food and stomach acid move back up into the esophagus and cause symptoms or problems such as damage to the esophagus. Treatment will depend on how bad your symptoms are. Follow a diet as told by your doctor. Take all medicines only as told by your doctor. This information is not intended to replace advice given to you by your health care provider. Make sure you discuss any questions you have with your health care provider. Document Revised: 06/30/2019 Document Reviewed: 06/30/2019 Elsevier Patient Education  [place]. Hypertension, Adult High blood pressure (hypertension) is when the force of blood pumping through the arteries is too strong. The arteries are the blood vessels that carry blood from the heart throughout the body. Hypertension forces the heart to work harder to pump blood and may cause arteries to become narrow or stiff. Untreated or uncontrolled hypertension can cause a heart attack, heart failure, a stroke, kidney disease, and other problems. A blood pressure reading consists of a higher number over a lower number.  Ideally, your blood pressure should be below 120/80. The first ("top") number is called the systolic pressure. It is a measure of the pressure in your arteries as your heart beats. The second ("bottom") number is called the diastolic pressure. It is a measure of the pressure in your arteries as the heart relaxes. What are the causes? The exact cause of this condition is not known. There are some conditions that result in or are related to high blood pressure. What increases the risk? Some risk factors for high blood pressure are under your control. The following factors may make you more likely to develop this condition: Smoking. Having type 2 diabetes mellitus, high cholesterol, or both. Not getting enough exercise or physical activity. Being overweight. Having too much fat, sugar, calories, or salt (sodium) in your diet. Drinking too much alcohol. Some risk factors for high blood pressure may be difficult or impossible to change. Some of these factors include: Having chronic kidney disease. Having a family history of high blood pressure. Age. Risk increases with age. Race. You may be at higher risk if you are African American. Gender. Men are at higher risk than women before age 10. After age 67, women are at higher risk than men. Having obstructive sleep apnea. Stress. What are the signs or symptoms? High blood pressure may not cause symptoms. Very high blood  pressure (hypertensive crisis) may cause: Headache. Anxiety. Shortness of breath. Nosebleed. Nausea and vomiting. Vision changes. Severe chest pain. Seizures. How is this diagnosed? This condition is diagnosed by measuring your blood pressure while you are seated, with your arm resting on a flat surface, your legs uncrossed, and your feet flat on the floor. The cuff of the blood pressure monitor will be placed directly against the skin of your upper arm at the level of your heart. It should be measured at least twice using the  same arm. Certain conditions can cause a difference in blood pressure between your right and left arms. Certain factors can cause blood pressure readings to be lower or higher than normal for a short period of time: When your blood pressure is higher when you are in a health care provider's office than when you are at home, this is called white coat hypertension. Most people with this condition do not need medicines. When your blood pressure is higher at home than when you are in a health care provider's office, this is called masked hypertension. Most people with this condition may need medicines to control blood pressure. If you have a high blood pressure reading during one visit or you have normal blood pressure with other risk factors, you may be asked to: Return on a different day to have your blood pressure checked again. Monitor your blood pressure at home for 1 week or longer. If you are diagnosed with hypertension, you may have other blood or imaging tests to help your health care provider understand your overall risk for other conditions. How is this treated? This condition is treated by making healthy lifestyle changes, such as eating healthy foods, exercising more, and reducing your alcohol intake. Your health care provider may prescribe medicine if lifestyle changes are not enough to get your blood pressure under control, and if: Your systolic blood pressure is above 130. Your diastolic blood pressure is above 80. Your personal target blood pressure may vary depending on your medical conditions, your age, and other factors. Follow these instructions at home: Eating and drinking  Eat a diet that is high in fiber and potassium, and low in sodium, added sugar, and fat. An example eating plan is called the DASH (Dietary Approaches to Stop Hypertension) diet. To eat this way: Eat plenty of fresh fruits and vegetables. Try to fill one half of your plate at each meal with fruits and  vegetables. Eat whole grains, such as whole-wheat pasta, brown rice, or whole-grain bread. Fill about one fourth of your plate with whole grains. Eat or drink low-fat dairy products, such as skim milk or low-fat yogurt. Avoid fatty cuts of meat, processed or cured meats, and poultry with skin. Fill about one fourth of your plate with lean proteins, such as fish, chicken without skin, beans, eggs, or tofu. Avoid pre-made and processed foods. These tend to be higher in sodium, added sugar, and fat. Reduce your daily sodium intake. Most people with hypertension should eat less than 1,500 mg of sodium a day. Do not drink alcohol if: Your health care provider tells you not to drink. You are pregnant, may be pregnant, or are planning to become pregnant. If you drink alcohol: Limit how much you use to: 0-1 drink a day for women. 0-2 drinks a day for men. Be aware of how much alcohol is in your drink. In the [place] one drink equals one 12 oz bottle of beer (355 mL), one 5 oz glass of wine (  148 mL), or one 1 oz glass of hard liquor (44 mL). Lifestyle  Work with your health care provider to maintain a healthy body weight or to lose weight. Ask what an ideal weight is for you. Get at least 30 minutes of exercise most days of the week. Activities may include walking, swimming, or biking. Include exercise to strengthen your muscles (resistance exercise), such as Pilates or lifting weights, as part of your weekly exercise routine. Try to do these types of exercises for 30 minutes at least 3 days a week. Do not use any products that contain nicotine or tobacco, such as cigarettes, e-cigarettes, and chewing tobacco. If you need help quitting, ask your health care provider. Monitor your blood pressure at home as told by your health care provider. Keep all follow-up visits as told by your health care provider. This is important. Medicines Take over-the-counter and prescription medicines only as told by your  health care provider. Follow directions carefully. Blood pressure medicines must be taken as prescribed. Do not skip doses of blood pressure medicine. Doing this puts you at risk for problems and can make the medicine less effective. Ask your health care provider about side effects or reactions to medicines that you should watch for. Contact a health care provider if you: Think you are having a reaction to a medicine you are taking. Have headaches that keep coming back (recurring). Feel dizzy. Have swelling in your ankles. Have trouble with your vision. Get help right away if you: Develop a severe headache or confusion. Have unusual weakness or numbness. Feel faint. Have severe pain in your chest or abdomen. Vomit repeatedly. Have trouble breathing. Summary Hypertension is when the force of blood pumping through your arteries is too strong. If this condition is not controlled, it may put you at risk for serious complications. Your personal target blood pressure may vary depending on your medical conditions, your age, and other factors. For most people, a normal blood pressure is less than 120/80. Hypertension is treated with lifestyle changes, medicines, or a combination of both. Lifestyle changes include losing weight, eating a healthy, low-sodium diet, exercising more, and limiting alcohol. This information is not intended to replace advice given to you by your health care provider. Make sure you discuss any questions you have with your health care provider. Document Revised: 08/29/2017 Document Reviewed: 08/29/2017 Elsevier Patient Education  [place].

## 2020-10-26 LAB — PSA: Prostate Specific Ag, Serum: 7.4 ng/mL — ABNORMAL HIGH (ref 0.0–4.0)

## 2020-10-26 LAB — COMPREHENSIVE METABOLIC PANEL
ALT: 7 IU/L (ref 0–44)
AST: 16 IU/L (ref 0–40)
Albumin/Globulin Ratio: 1.4 (ref 1.2–2.2)
Albumin: 4.3 g/dL (ref 3.6–4.6)
Alkaline Phosphatase: 132 IU/L — ABNORMAL HIGH (ref 44–121)
BUN/Creatinine Ratio: 16 (ref 10–24)
BUN: 13 mg/dL (ref 8–27)
Bilirubin Total: 0.4 mg/dL (ref 0.0–1.2)
CO2: 21 mmol/L (ref 20–29)
Calcium: 9.3 mg/dL (ref 8.6–10.2)
Chloride: 105 mmol/L (ref 96–106)
Creatinine, Ser: 0.82 mg/dL (ref 0.76–1.27)
Globulin, Total: 3.1 g/dL (ref 1.5–4.5)
Glucose: 92 mg/dL (ref 70–99)
Potassium: 4.3 mmol/L (ref 3.5–5.2)
Sodium: 142 mmol/L (ref 134–144)
Total Protein: 7.4 g/dL (ref 6.0–8.5)
eGFR: 87 mL/min/{1.73_m2} (ref >59)

## 2020-10-26 LAB — CBC WITH DIFFERENTIAL/PLATELET
Basophils Absolute: 0.1 10*3/uL (ref 0.0–0.2)
Basos: 1 %
EOS (ABSOLUTE): 1 10*3/uL — ABNORMAL HIGH (ref 0.0–0.4)
Eos: 14 %
Hematocrit: 41.5 % (ref 37.5–51.0)
Hemoglobin: 14 g/dL (ref 13.0–17.7)
Immature Grans (Abs): 0 10*3/uL (ref 0.0–0.1)
Immature Granulocytes: 0 %
Lymphocytes Absolute: 2.3 10*3/uL (ref 0.7–3.1)
Lymphs: 32 %
MCH: 30.6 pg (ref 26.6–33.0)
MCHC: 33.7 g/dL (ref 31.5–35.7)
MCV: 91 fL (ref 79–97)
Monocytes Absolute: 0.5 10*3/uL (ref 0.1–0.9)
Monocytes: 7 %
Neutrophils Absolute: 3.2 10*3/uL (ref 1.4–7.0)
Neutrophils: 46 %
Platelets: 205 10*3/uL (ref 150–450)
RBC: 4.58 x10E6/uL (ref 4.14–5.80)
RDW: 13.3 % (ref 11.6–15.4)
WBC: 7.1 10*3/uL (ref 3.4–10.8)

## 2020-10-26 LAB — LIPID PANEL
Chol/HDL Ratio: 1.8 ratio (ref 0.0–5.0)
Cholesterol, Total: 116 mg/dL (ref 100–199)
HDL: 63 mg/dL (ref >39)
LDL Chol Calc (NIH): 40 mg/dL (ref 0–99)
Triglycerides: 61 mg/dL (ref 0–149)
VLDL Cholesterol Cal: 13 mg/dL (ref 5–40)

## 2020-10-28 ENCOUNTER — Other Ambulatory Visit: Payer: Self-pay | Admitting: Nurse Practitioner

## 2020-10-28 DIAGNOSIS — R972 Elevated prostate specific antigen [PSA]: Secondary | ICD-10-CM

## 2020-10-28 NOTE — Telephone Encounter (Signed)
  Prescription Request  10/28/2020  Is this a "Controlled Substance" medicine? YES  Have you seen your PCP in the last 2 weeks? YES  If YES, route message to pool  -  If NO, patient needs to be scheduled for appointment.  What is the name of the medication or equipment? Lorazepam 1 mg. Aide states Je calls it in once a month to keep the cycle going  Have you contacted your pharmacy to request a refill? YES   Which pharmacy would you like this sent to? [facility] in [place]   Patient notified that their request is being sent to the clinical staff for review and that they should receive a response within 2 business days.

## 2020-10-29 MED ORDER — LORAZEPAM 1 MG PO TABS: 1.0000 mg | ORAL_TABLET | Freq: Every day | ORAL | 0 refills | Status: DC

## 2020-11-01 ENCOUNTER — Other Ambulatory Visit: Payer: Self-pay | Admitting: Primary Care

## 2020-11-03 ENCOUNTER — Other Ambulatory Visit: Payer: Self-pay | Admitting: Nurse Practitioner

## 2020-11-03 MED ORDER — ALBUTEROL SULFATE HFA 108 (90 BASE) MCG/ACT IN AERS: 2.0000 | INHALATION_SPRAY | RESPIRATORY_TRACT | 0 refills | Status: DC | PRN

## 2020-11-03 NOTE — Telephone Encounter (Signed)
  Prescription Request  11/03/2020  Is this a "Controlled Substance" medicine? NO  Have you seen your PCP in the last 2 weeks? NO  If YES, route message to pool  -  If NO, patient needs to be scheduled for appointment.  What is the name of the medication or equipment? Albuterol 108 90 Base  Have you contacted your pharmacy to request a refill? YES   Which pharmacy would you like this sent to? [facility]   Patient notified that their request is being sent to the clinical staff for review and that they should receive a response within 2 business days.

## 2020-11-04 ENCOUNTER — Telehealth: Payer: Self-pay | Admitting: Nurse Practitioner

## 2020-11-04 DIAGNOSIS — J84112 Idiopathic pulmonary fibrosis: Secondary | ICD-10-CM

## 2020-11-04 MED ORDER — ALBUTEROL SULFATE (2.5 MG/3ML) 0.083% IN NEBU: 2.5000 mg | INHALATION_SOLUTION | Freq: Four times a day (QID) | RESPIRATORY_TRACT | 1 refills | Status: DC | PRN

## 2020-11-04 NOTE — Telephone Encounter (Signed)
Pts caregiver came in to let us know that provider sent over a Rx for pt to have an inhaler but says she did not send Rx for pt to get a nebulizer with solution and that is what pt needs.  Please advise and send to [facility].Call caregiver to give update on Rx being sent.

## 2020-11-04 NOTE — Telephone Encounter (Signed)
Pt aware rx was sent in 

## 2020-11-04 NOTE — Telephone Encounter (Signed)
Do not see anything in last office note about sending it in. Covering pcp - please advise

## 2020-11-04 NOTE — Telephone Encounter (Signed)
Rx sent in

## 2020-11-05 ENCOUNTER — Encounter: Payer: Self-pay | Admitting: Family Medicine

## 2020-11-09 DIAGNOSIS — M4800 Spinal stenosis, site unspecified: Secondary | ICD-10-CM | POA: Diagnosis not present

## 2020-11-09 DIAGNOSIS — J449 Chronic obstructive pulmonary disease, unspecified: Secondary | ICD-10-CM | POA: Diagnosis not present

## 2020-11-09 DIAGNOSIS — I10 Essential (primary) hypertension: Secondary | ICD-10-CM | POA: Diagnosis not present

## 2020-11-10 ENCOUNTER — Other Ambulatory Visit: Payer: Self-pay

## 2020-11-10 ENCOUNTER — Ambulatory Visit (INDEPENDENT_AMBULATORY_CARE_PROVIDER_SITE_OTHER): Payer: Medicare Other | Admitting: Urology

## 2020-11-10 ENCOUNTER — Encounter: Payer: Self-pay | Admitting: Urology

## 2020-11-10 VITALS — BP 150/74 | HR 76 | Temp 98.0°F

## 2020-11-10 DIAGNOSIS — N3944 Nocturnal enuresis: Secondary | ICD-10-CM

## 2020-11-10 DIAGNOSIS — R350 Frequency of micturition: Secondary | ICD-10-CM | POA: Diagnosis not present

## 2020-11-10 LAB — URINALYSIS, ROUTINE W REFLEX MICROSCOPIC
Bilirubin, UA: NEGATIVE
Glucose, UA: NEGATIVE
Ketones, UA: NEGATIVE
Leukocytes,UA: NEGATIVE
Nitrite, UA: NEGATIVE
Protein,UA: NEGATIVE
RBC, UA: NEGATIVE
Specific Gravity, UA: 1.02 (ref 1.005–1.030)
Urobilinogen, Ur: 2 mg/dL — ABNORMAL HIGH (ref 0.2–1.0)
pH, UA: 7 (ref 5.0–7.5)

## 2020-11-10 NOTE — Progress Notes (Signed)
post void residual =67ml

## 2020-11-10 NOTE — Progress Notes (Signed)
Assessment: 1. Nocturnal enuresis     Plan: Increase fluid intake Continue current dose of Myrbetriq Return to office in 2 months  Chief Complaint:  Chief Complaint  Patient presents with   Urinary Frequency     History of Present Illness:  [name] is a 86 y.o. year old male who is seen in consultation from Ivy Lynn, NP  for evaluation of elevated PSA and urinary incontinence.  He has nighttime incontinence which has been gradually worsening.  He voids 1-2 times per day.  No significant fluid intake throughout the day.  He is wearing adult diapers and soaks these at night.  No dysuria or gross hematuria.  He does not feel like he empties his bladder completely.  He is currently on Myrbetriq 25 mg.  He is not sure how long he has been taking this medication. He is nonambulatory due to significant lower extremity weakness.  He voids into a urinal.  A recent PSA from 10/26/2020 was 7.4.  No prior PSAs available for comparison.   Past Medical History:  Past Medical History:  Diagnosis Date   Abnormality of gait 08/01/2013   Arthritis    Blindness of left eye    Posttraumatic   Carotid artery disease (HCC)    Colon polyp    Constipation    Diaphragmatic hernia    Disc disease, degenerative, cervical    Diverticulitis    ED (erectile dysfunction)    Gastroparesis    GERD (gastroesophageal reflux disease)    Glaucoma    Heart disease    Hyperlipidemia    Hypertension    Insomnia    Memory difficulty 08/04/2014   Nicotine dependence    Numbness and tingling of right arm 08/04/2014   Osteoarthritis    Overactive bladder    Radiculopathy    Shortness of breath    with activity   Sleep apnea    does not use CPAP.  Tested maybe 5- 10 years ago   Stroke [facility])    Left side weakness.   Unilateral inguinal hernia     Past Surgical History:  Past Surgical History:  Procedure Laterality Date   ANTERIOR CERVICAL DECOMP/DISCECTOMY FUSION N/A 07/01/2012    Procedure: ANTERIOR CERVICAL DECOMPRESSION/DISCECTOMY FUSION CERVICAL FIVE-SIX [name] REMOVAL CERVICAL SIX-SEVEN;  Surgeon: [name], MD;  Location: [place] [facility];  Service: Neurosurgery;  Laterality: N/A;   CERVICAL FUSION     COLONOSCOPY   08/04/2002     RMR: Normal rectum/Pancolonic diverticula/Colonic polyps as described above, biopsied and/or snared   COLONOSCOPY  60/08/2009   RMR: normal rectum/left and right sided diverticula/multiple colonic polyps. tubular adenomas. surveillance due 2014   COLONOSCOPY WITH ESOPHAGOGASTRODUODENOSCOPY (EGD) N/A 12/18/2012   Dr. [name]:  Colonic diverticulosis. Multiple colonic polyps-hyperplastic polyps and tubular adenoma. Friable anal canal hemorrhoids. EGD with chronic atrophic gastritis   ESOPHAGOGASTRODUODENOSCOPY  04/14/2002   OQH:[id number]'P' ring, status post dilation as described above/Hiatal hernia, focal antral erosions of uncertain clinical significance   ESOPHAGOGASTRODUODENOSCOPY  06/09/2009   RMR: normal esophagus s/p dilator/small hiatal hernia otherwise normal   HERNIA REPAIR Right    inguinal   I & D EXTREMITY Right 04/23/2017   Procedure: [place] HAND;  Surgeon: [name], MD;  Location: [place];  Service: Orthopedics;  Laterality: Right;   INGUINAL HERNIA REPAIR Left 04/15/2014   Procedure: LEFT INGUINAL HERNIORRHAPHY WITH MESH;  Surgeon: [name], MD;  Location: [facility];  Service: General;  Laterality: Left;  INSERTION OF MESH Left 04/15/2014   Procedure: INSERTION OF MESH;  Surgeon: [name], MD;  Location: [facility];  Service: General;  Laterality: Left;    Allergies:  No Known Allergies  Family History:  Family History  Problem Relation Age of Onset   Cancer Mother    Dementia Father    Colon cancer Neg Hx     Social History:  Social History   Tobacco Use   Smoking status: Every Day    Packs/day: 0.25    Years: 60.00    Pack years: 15.00    Types: Cigarettes   Smokeless  tobacco: Never   Tobacco comments:    using patch  Vaping Use   Vaping Use: Never used  Substance Use Topics   Alcohol use: No   Drug use: No    Review of symptoms:  Constitutional:  Negative for unexplained weight loss, night sweats, fever, chills ENT:  Negative for nose bleeds, sinus pain, painful swallowing CV:  Negative for chest pain, shortness of breath, exercise intolerance, palpitations, loss of consciousness Resp:  Negative for cough, wheezing, shortness of breath GI:  Negative for nausea, vomiting, diarrhea, bloody stools GU:  Positives noted in HPI; otherwise negative for gross hematuria, dysuria Neuro:  Negative for seizures, poor balance, limb weakness, slurred speech Psych:  Negative for lack of energy, depression, anxiety Endocrine:  Negative for polydipsia, polyuria, symptoms of hypoglycemia (dizziness, hunger, sweating) Hematologic:  Negative for anemia, purpura, petechia, prolonged or excessive bleeding, use of anticoagulants  Allergic:  Negative for difficulty breathing or choking as a result of exposure to anything; no shellfish allergy; no allergic response (rash/itch) to materials, foods  Physical exam: BP (!) 150/74   Pulse 76   Temp 98 F (36.7 C)  GENERAL APPEARANCE:  Well appearing, well developed, well nourished, NAD HEENT: Atraumatic, Normocephalic, oropharynx clear. NECK: Supple without lymphadenopathy or thyromegaly. LUNGS: Clear to auscultation bilaterally. HEART: Regular Rate and Rhythm without murmurs, gallops, or rubs. ABDOMEN: Soft, non-tender, No Masses. EXTREMITIES: Without clubbing, cyanosis, or edema. NEUROLOGIC:  Alert and oriented x 3, in wheelchair, CN II-XII grossly intact.  MENTAL STATUS:  Appropriate. BACK:  Non-tender to palpation.  No CVAT SKIN:  Warm, dry and intact.   GU: Penis:  uncircumcised with phimosis Meatus: not visualized Scrotum: normal, no masses Testis: normal without masses bilateral Prostate: 40 g, NT, no  nodules Rectum: Normal tone,  no masses or tenderness   Results: U/A: dipstick negative  PVR:  3 ml

## 2020-11-10 NOTE — Progress Notes (Signed)
EyeGastrointestinal (lower) : Negative for lower GI symptoms  Constitutional : Negative for symptoms  Skin: Negative for skin symptoms  Eyes: Blurred vision  Ear/Nose/Throat : Negative for Ear/Nose/Throat symptoms  Hematologic/Lymphatic: Negative for Hematologic/Lymphatic symptoms  Cardiovascular : Negative for cardiovascular symptoms  Respiratory : Cough Shortness of breath  Endocrine: Negative for endocrine symptoms  Musculoskeletal: Negative for musculoskeletal symptoms  Neurological: Negative for neurological symptoms  Psychologic: Negative for psychiatric symptoms

## 2020-11-18 ENCOUNTER — Telehealth: Payer: Self-pay | Admitting: Internal Medicine

## 2020-11-23 ENCOUNTER — Other Ambulatory Visit: Payer: Self-pay | Admitting: Nurse Practitioner

## 2020-11-23 DIAGNOSIS — F5104 Psychophysiologic insomnia: Secondary | ICD-10-CM

## 2020-11-23 DIAGNOSIS — B351 Tinea unguium: Secondary | ICD-10-CM | POA: Diagnosis not present

## 2020-11-23 DIAGNOSIS — I739 Peripheral vascular disease, unspecified: Secondary | ICD-10-CM | POA: Diagnosis not present

## 2020-11-23 DIAGNOSIS — L110 Acquired keratosis follicularis: Secondary | ICD-10-CM | POA: Diagnosis not present

## 2020-11-29 ENCOUNTER — Other Ambulatory Visit: Payer: Self-pay | Admitting: Nurse Practitioner

## 2020-11-29 MED ORDER — LORAZEPAM 1 MG PO TABS: 1.0000 mg | ORAL_TABLET | Freq: Every day | ORAL | 0 refills | Status: DC

## 2020-11-29 NOTE — Telephone Encounter (Signed)
  Prescription Request  11/29/2020   What is the name of the medication or equipment? LORAZEPAM  Have you contacted your pharmacy to request a refill? YES  Which pharmacy would you like this sent to? [facility]  Pt is out of medication because only a 30 day supply was called in for him. Pt is not due to come back in until 01/25/2021 per providers OV notes. Needs refills called in to last him until then.

## 2020-11-29 NOTE — Telephone Encounter (Signed)
Pt aware refill sent to pharmacy 

## 2020-12-24 ENCOUNTER — Other Ambulatory Visit: Payer: Self-pay

## 2020-12-24 ENCOUNTER — Other Ambulatory Visit: Payer: Self-pay | Admitting: Nurse Practitioner

## 2020-12-24 DIAGNOSIS — F5104 Psychophysiologic insomnia: Secondary | ICD-10-CM

## 2020-12-24 NOTE — Telephone Encounter (Signed)
Last office visit 10/25/20 Last refill 11/23/20, #30, no refills

## 2020-12-28 ENCOUNTER — Other Ambulatory Visit: Payer: Self-pay | Admitting: *Deleted

## 2020-12-28 NOTE — Telephone Encounter (Signed)
TC from caregiver about RF as well, calling for month refill request. They are aware Ardeen Fillers will be back in the office on Thursday, they do have enough till she returns.

## 2021-01-05 DIAGNOSIS — I10 Essential (primary) hypertension: Secondary | ICD-10-CM | POA: Diagnosis not present

## 2021-01-05 DIAGNOSIS — J449 Chronic obstructive pulmonary disease, unspecified: Secondary | ICD-10-CM | POA: Diagnosis not present

## 2021-01-07 ENCOUNTER — Encounter: Payer: Self-pay | Admitting: Nurse Practitioner

## 2021-01-07 ENCOUNTER — Ambulatory Visit (INDEPENDENT_AMBULATORY_CARE_PROVIDER_SITE_OTHER): Payer: Commercial Managed Care - HMO | Admitting: Nurse Practitioner

## 2021-01-07 VITALS — BP 141/76 | HR 68 | Temp 97.3°F | Ht 69.0 in

## 2021-01-07 DIAGNOSIS — F419 Anxiety disorder, unspecified: Secondary | ICD-10-CM | POA: Diagnosis not present

## 2021-01-07 DIAGNOSIS — F321 Major depressive disorder, single episode, moderate: Secondary | ICD-10-CM | POA: Diagnosis not present

## 2021-01-07 MED ORDER — LORAZEPAM 1 MG PO TABS: 1.0000 mg | ORAL_TABLET | Freq: Every day | ORAL | 0 refills | Status: DC

## 2021-01-07 NOTE — Patient Instructions (Signed)

## 2021-01-07 NOTE — Assessment & Plan Note (Signed)
Anxiety well control with 1 mg ativan hour of sleep. Rx refill sent to pharmacy

## 2021-01-07 NOTE — Assessment & Plan Note (Addendum)
Depression well-controlled no changes necessary. 

## 2021-01-07 NOTE — Progress Notes (Signed)
New Patient Office Visit  Subjective:  Patient ID: [name], male    DOB: [birth year]  Age: 87 y.o. MRN: [id number]  CC:  Chief Complaint  Patient presents with   Insomnia   Depression    HPI [name] presents for Depression, Follow-up  He  was last seen for this 10 months ago. Changes made at last visit include no changes made.   He reports good compliance with treatment. He is not having side effects.   He reports good tolerance of treatment. Current symptoms include: difficulty concentrating and insomnia He feels he is Improved since last visit.  Depression screen [facility] 2/9 01/07/2021 10/25/2020 08/31/2020  Decreased Interest 2 0 1  Down, Depressed, Hopeless 0 0 1  PHQ - 2 Score 2 0 2  Altered sleeping 0 - 1  Tired, decreased energy 2 - 1  Change in appetite 0 - 1  Feeling bad or failure about yourself  0 - 0  Trouble concentrating 0 - 2  Moving slowly or fidgety/restless 1 - 2  Suicidal thoughts 0 - 0  PHQ-9 Score 5 - 9  Difficult doing work/chores Somewhat difficult - Somewhat difficult  Some recent data might be hidden   Anxiety, Follow-up  He was last seen for anxiety 10 months ago. Changes made at last visit include continue 1 mg lorazepam hour sleep.[name]   He reports good compliance with treatment. He reports good tolerance of treatment. He is not having side effects.   He feels his anxiety is moderate and Improved since last visit.  Symptoms: No chest pain No difficulty concentrating  No dizziness No fatigue  Yes feelings of losing control Yes insomnia  Yes irritable No palpitations  No panic attacks No racing thoughts  No shortness of breath No sweating  No tremors/shakes    GAD-7 Results GAD-7 Generalized Anxiety Disorder Screening Tool 01/07/2021 10/25/2020  1. Feeling Nervous, Anxious, or on Edge 2 0  2. Not Being Able to Stop or Control Worrying 2 0  3. Worrying Too Much About Different Things 2 0  4. Trouble Relaxing 2 0  5. Being So  Restless it's Hard To Sit Still 2 0  6. Becoming Easily Annoyed or Irritable 2 0  7. Feeling Afraid As If Something Awful Might Happen 0 0  Total GAD-7 Score 12 0  Difficulty At Work, Home, or Getting  Along With Others? Somewhat difficult -    PHQ-9 Scores PHQ9 SCORE ONLY 01/07/2021 10/25/2020 08/31/2020  PHQ-9 Total Score 5 0 9     Insomnia  Patient reports sleep is improving and well-controlled on 1 mg of lorazepam at hour of sleep.  Past Medical History:  Diagnosis Date   Abnormality of gait 08/01/2013   Arthritis    Blindness of left eye    Posttraumatic   Carotid artery disease (HCC)    Colon polyp    Constipation    Diaphragmatic hernia    Disc disease, degenerative, cervical    Diverticulitis    ED (erectile dysfunction)    Gastroparesis    GERD (gastroesophageal reflux disease)    Glaucoma    Heart disease    Hyperlipidemia    Hypertension    Insomnia    Memory difficulty 08/04/2014   Nicotine dependence    Numbness and tingling of right arm 08/04/2014   Osteoarthritis    Overactive bladder    Radiculopathy    Shortness of breath    with activity   Sleep apnea  does not use CPAP.  Tested maybe 5- 10 years ago   Stroke [facility])    Left side weakness.   Unilateral inguinal hernia     Past Surgical History:  Procedure Laterality Date   ANTERIOR CERVICAL DECOMP/DISCECTOMY FUSION N/A 07/01/2012   Procedure: ANTERIOR CERVICAL DECOMPRESSION/DISCECTOMY FUSION CERVICAL FIVE-SIX [name] REMOVAL CERVICAL SIX-SEVEN;  Surgeon: [name], MD;  Location: [place] [facility];  Service: Neurosurgery;  Laterality: N/A;   CERVICAL FUSION     COLONOSCOPY   08/04/2002     RMR: Normal rectum/Pancolonic diverticula/Colonic polyps as described above, biopsied and/or snared   COLONOSCOPY  60/08/2009   RMR: normal rectum/left and right sided diverticula/multiple colonic polyps. tubular adenomas. surveillance due 2014   COLONOSCOPY WITH ESOPHAGOGASTRODUODENOSCOPY (EGD) N/A 12/18/2012    Dr. [name]:  Colonic diverticulosis. Multiple colonic polyps-hyperplastic polyps and tubular adenoma. Friable anal canal hemorrhoids. EGD with chronic atrophic gastritis   ESOPHAGOGASTRODUODENOSCOPY  04/14/2002   AYT:[id number]' ring, status post dilation as described above/Hiatal hernia, focal antral erosions of uncertain clinical significance   ESOPHAGOGASTRODUODENOSCOPY  06/09/2009   RMR: normal esophagus s/p dilator/small hiatal hernia otherwise normal   HERNIA REPAIR Right    inguinal   I & D EXTREMITY Right 04/23/2017   Procedure: [place] HAND;  Surgeon: [name], MD;  Location: [place];  Service: Orthopedics;  Laterality: Right;   INGUINAL HERNIA REPAIR Left 04/15/2014   Procedure: LEFT INGUINAL HERNIORRHAPHY WITH MESH;  Surgeon: [name], MD;  Location: [facility];  Service: General;  Laterality: Left;   INSERTION OF MESH Left 04/15/2014   Procedure: INSERTION OF MESH;  Surgeon: [name], MD;  Location: [facility];  Service: General;  Laterality: Left;    Family History  Problem Relation Age of Onset   Cancer Mother    Dementia Father    Colon cancer Neg Hx     Social History   Socioeconomic History   Marital status: Widowed    Spouse name: Not on file   Number of children: 2   Years of education: hs   Highest education level: Not on file  Occupational History   Occupation: Retired    Fish farm manager: RETIRED  Tobacco Use   Smoking status: Every Day    Packs/day: 0.25    Years: 60.00    Pack years: 15.00    Types: Cigarettes   Smokeless tobacco: Never   Tobacco comments:    using patch  Vaping Use   Vaping Use: Never used  Substance and Sexual Activity   Alcohol use: No   Drug use: No   Sexual activity: Not Currently  Other Topics Concern   Not on file  Social History Narrative   Patient drinks 1-2 cups of caffeine daily.   Patient is right handed.   Lives with his sister [name]   Has a nurse/ Caregiver, [name] Scales to  help with bed baths, dressing, meals, etc 5 days per week   Social Determinants of Health   Financial Resource Strain: Low Risk    Difficulty of Paying Living Expenses: Not very hard  Food Insecurity: No Food Insecurity   Worried About Charity fundraiser in the Last Year: Never true   Ran Out of Food in the Last Year: Never true  Transportation Needs: No Transportation Needs   Lack of Transportation (Medical): No   Lack of Transportation (Non-Medical): No  Physical Activity: Inactive   Days of Exercise per Week: 0 days   Minutes of  Exercise per Session: 0 min  Stress: No Stress Concern Present   Feeling of Stress : Not at all  Social Connections: Moderately Isolated   Frequency of Communication with Friends and Family: Twice a week   Frequency of Social Gatherings with Friends and Family: More than three times a week   Attends Religious Services: More than 4 times per year   Active Member of [employer] or Organizations: No   Attends Archivist Meetings: Never   Marital Status: Widowed  Human resources officer Violence: Not At Risk   Fear of Current or Ex-Partner: No   Emotionally Abused: No   Physically Abused: No   Sexually Abused: No    ROS Review of Systems  Constitutional: Negative.   HENT: Negative.    Respiratory: Negative.    Gastrointestinal: Negative.   Endocrine: Negative.   Genitourinary: Negative.   Musculoskeletal: Negative.   Skin:  Negative for rash.  Psychiatric/Behavioral:  The patient is nervous/anxious.   All other systems reviewed and are negative.  Objective:   Today's Vitals: BP (!) 141/76    Pulse 68    Temp (!) 97.3 F (36.3 C) (Temporal)    Ht _0  (1.753 m)    BMI 23.92 kg/m   Physical Exam Vitals and nursing note reviewed.  Constitutional:      Appearance: Normal appearance. He is normal weight.  HENT:     Head: Normocephalic.     Right Ear: External ear normal.     Left Ear: External ear normal.     Nose: Nose normal.  Eyes:      Conjunctiva/sclera: Conjunctivae normal.  Cardiovascular:     Rate and Rhythm: Normal rate and regular rhythm.     Pulses: Normal pulses.     Heart sounds: Normal heart sounds.  Pulmonary:     Effort: Pulmonary effort is normal.     Breath sounds: Normal breath sounds.  Abdominal:     General: Bowel sounds are normal.  Musculoskeletal:        General: Normal range of motion.  Skin:    General: Skin is warm.     Findings: No rash.  Neurological:     Mental Status: He is oriented to person, place, and time.  Psychiatric:        Attention and Perception: Attention and perception normal.        Mood and Affect: Mood is anxious and depressed.        Speech: Speech normal.        Thought Content: Thought content normal.    Assessment & Plan:   Problem List Items Addressed This Visit       Other   Depression, major, single episode, moderate ([place])    Depression well-controlled no changes necessary       Relevant Medications   LORazepam (ATIVAN) 1 MG tablet   LORazepam (ATIVAN) 1 MG tablet (Start on 02/07/2021)   LORazepam (ATIVAN) 1 MG tablet   Anxiety - Primary    Anxiety well control with 1 mg ativan hour of sleep. Rx refill sent to pharmacy      Relevant Medications   LORazepam (ATIVAN) 1 MG tablet   LORazepam (ATIVAN) 1 MG tablet (Start on 02/07/2021)   LORazepam (ATIVAN) 1 MG tablet    Outpatient Encounter Medications as of 01/07/2021  Medication Sig   LORazepam (ATIVAN) 1 MG tablet Take 1 tablet (1 mg total) by mouth at bedtime.   [START ON 02/07/2021] LORazepam (ATIVAN) 1 MG  tablet Take 1 tablet (1 mg total) by mouth at bedtime.   LORazepam (ATIVAN) 1 MG tablet Take 1 tablet (1 mg total) by mouth at bedtime.   albuterol (PROVENTIL) (2.5 MG/3ML) 0.083% nebulizer solution Take 3 mLs (2.5 mg total) by nebulization every 6 (six) hours as needed for wheezing or shortness of breath.   albuterol (VENTOLIN HFA) 108 (90 Base) MCG/ACT inhaler Inhale 2 puffs into the lungs every 4  (four) hours as needed for wheezing or shortness of breath.   amLODipine (NORVASC) 5 MG tablet TAKE (1) TABLET BY MOUTH AT BEDTIME.   aspirin 325 MG tablet Take 1 tablet (325 mg total) by mouth daily.   atorvastatin (LIPITOR) 40 MG tablet TAKE (1) TABLET BY MOUTH AT BEDTIME.   Cholecalciferol (VITAMIN D-3) 25 MCG (1000 UT) CAPS Take 1 capsule (1,000 Units total) by mouth daily.   clotrimazole-betamethasone (LOTRISONE) cream APPLY 1 APPLICATION TOPICALLY TWICE DAILY.   fluticasone (FLONASE) 50 MCG/ACT nasal spray USE 1 OR 2 SPRAYS IN EACH NOSTRIL EVERY DAY FOR NASAL ALLERGIES.   levETIRAcetam (KEPPRA) 250 MG tablet Take one tablet by mouth each morning   lisinopril (ZESTRIL) 10 MG tablet TAKE 1 TABLET BY MOUTH ONCE A DAY FOR HIGH BLOOD PRESSURE. TAKE WITH 20MG TABLET FOR TOTAL DOSE OF 30MG.   lisinopril (ZESTRIL) 20 MG tablet TAKE 1 TABLET BY MOUTH ONCE A DAY. TAKE WITH 10MG TABLET FOR TOTAL DOSE OF 30MG.   lubiprostone (AMITIZA) 24 MCG capsule Take one by mouth daily   mirabegron ER (MYRBETRIQ) 25 MG TB24 tablet Take 1 tablet (25 mg total) by mouth daily.   naproxen sodium (ALEVE) 220 MG tablet Take 440 mg by mouth daily as needed.   NITROSTAT 0.4 MG SL tablet PLACE 1 TAB UNDER TONGUE EVERY 5 MIN IF NEEDED FOR CHEST PAIN. MAY USE 3 TIMES.NO RELIEF CALL 911.   omeprazole (PRILOSEC) 20 MG capsule Take one by mouth daily   PARoxetine (PAXIL) 40 MG tablet TAKE 1 TABLET BY MOUTH ONCE A DAY.   QC VITAMIN B1 100 MG tablet TAKE 1 TABLET BY MOUTH ONCE A DAY.   Respiratory Therapy Supplies (NEBULIZER/TUBING/MOUTHPIECE) KIT Disp one nebulizer machine, tubing set and mouthpiece kit   thiamine (VITAMIN B-1) 100 MG tablet Take 1 tablet (100 mg total) by mouth daily.   traZODone (DESYREL) 100 MG tablet TAKE (1) TABLET BY MOUTH AT BEDTIME AS NEEDED FOR SLEEP   vitamin B-12 (CYANOCOBALAMIN) 1000 MCG tablet Take 1 tablet (1,000 mcg total) by mouth daily.   [DISCONTINUED] aspirin EC 325 MG tablet TAKE 1 TABLET BY  MOUTH ONCE A DAY.   [DISCONTINUED] LORazepam (ATIVAN) 1 MG tablet Take 1 tablet (1 mg total) by mouth at bedtime.   No facility-administered encounter medications on file as of 01/07/2021.    Follow-up: Return in about 3 months (around 04/07/2021) for as scheduled chroninc disease management.   Ivy Lynn, NP

## 2021-01-11 ENCOUNTER — Ambulatory Visit: Payer: Medicare Other | Admitting: Urology

## 2021-01-25 ENCOUNTER — Ambulatory Visit: Payer: Medicare Other | Admitting: Nurse Practitioner

## 2021-01-25 ENCOUNTER — Other Ambulatory Visit: Payer: Self-pay | Admitting: Nurse Practitioner

## 2021-01-26 ENCOUNTER — Other Ambulatory Visit: Payer: Self-pay | Admitting: Nurse Practitioner

## 2021-02-04 DIAGNOSIS — J449 Chronic obstructive pulmonary disease, unspecified: Secondary | ICD-10-CM | POA: Diagnosis not present

## 2021-02-04 DIAGNOSIS — I10 Essential (primary) hypertension: Secondary | ICD-10-CM | POA: Diagnosis not present

## 2021-02-17 ENCOUNTER — Other Ambulatory Visit: Payer: Self-pay | Admitting: Nurse Practitioner

## 2021-02-22 ENCOUNTER — Telehealth: Payer: Self-pay | Admitting: Nurse Practitioner

## 2021-02-22 DIAGNOSIS — I739 Peripheral vascular disease, unspecified: Secondary | ICD-10-CM | POA: Diagnosis not present

## 2021-02-22 DIAGNOSIS — L110 Acquired keratosis follicularis: Secondary | ICD-10-CM | POA: Diagnosis not present

## 2021-02-22 DIAGNOSIS — B351 Tinea unguium: Secondary | ICD-10-CM | POA: Diagnosis not present

## 2021-02-22 NOTE — Telephone Encounter (Signed)
°  Prescription Request  02/22/2021  Is this a "Controlled Substance" medicine? Lorazepam, others are not   Have you seen your PCP in the last 2 weeks? No last apt 1/6 for med refill  If YES, route message to pool  -  If NO, patient needs to be scheduled for appointment.  What is the name of the medication or equipment? amLODipine (NORVASC) 5 MG tablet, atorvastatin (LIPITOR) 40 MG tablet, levETIRAcetam (KEPPRA) 250 MG tablet, MYRBETRIQ 25 MG TB24 tablet, vitamin B-12 (CYANOCOBALAMIN) 1000 MCG tablet, LORazepam (ATIVAN) 1 MG tablet, lubiprostone (AMITIZA) 24 MCG capsule  Have you contacted your pharmacy to request a refill? Pharmacy calling    Which pharmacy would you like this sent to? [facility] in [place]   Patient notified that their request is being sent to the clinical staff for review and that they should receive a response within 2 business days.

## 2021-02-22 NOTE — Telephone Encounter (Signed)
Spoke with pharmacy, they have received refills that were sent on 2/16./23.

## 2021-02-23 ENCOUNTER — Other Ambulatory Visit: Payer: Self-pay

## 2021-02-23 DIAGNOSIS — J84112 Idiopathic pulmonary fibrosis: Secondary | ICD-10-CM

## 2021-02-23 MED ORDER — ALBUTEROL SULFATE (2.5 MG/3ML) 0.083% IN NEBU: 2.5000 mg | INHALATION_SOLUTION | Freq: Four times a day (QID) | RESPIRATORY_TRACT | 1 refills | Status: DC | PRN

## 2021-02-23 NOTE — Telephone Encounter (Signed)
Spoke with Crystal at [facility] and advised that I sent in a refill for the Albuterol and a phone message has been sent to [facility] about the Lorazepam.

## 2021-02-23 NOTE — Telephone Encounter (Signed)
Patient was seen for office visit on 01/07/21 and one month of Lorazepam was prescribed, patient told to follow up in three months.  Patient needs refills until then.  Please advise.

## 2021-02-23 NOTE — Telephone Encounter (Signed)
[name] calling Did not receive rx for ativan and albuterol solution.

## 2021-02-24 ENCOUNTER — Other Ambulatory Visit: Payer: Self-pay | Admitting: Family

## 2021-02-24 ENCOUNTER — Other Ambulatory Visit: Payer: Self-pay | Admitting: Nurse Practitioner

## 2021-02-24 NOTE — Telephone Encounter (Signed)
[facility] called in if the Lorazepam was refilled. Told her I see the call from yesterday, looked through the medications and read to her were Je sent 3 on 01/07/21 2 had notes to pharmacy, fill 30 days from first prescription & fill 60 days from first prescription, they all have a confirmation received by pharmacy on our end. She was going to have someone else look on her end.

## 2021-03-04 DIAGNOSIS — I10 Essential (primary) hypertension: Secondary | ICD-10-CM | POA: Diagnosis not present

## 2021-03-04 DIAGNOSIS — J449 Chronic obstructive pulmonary disease, unspecified: Secondary | ICD-10-CM | POA: Diagnosis not present

## 2021-03-22 ENCOUNTER — Other Ambulatory Visit: Payer: Self-pay | Admitting: Family

## 2021-03-22 DIAGNOSIS — F5104 Psychophysiologic insomnia: Secondary | ICD-10-CM

## 2021-03-23 NOTE — Telephone Encounter (Signed)
Last office visit 01/07/21 ?Last refill 12/24/21, #30, no refills ?

## 2021-04-06 DIAGNOSIS — J449 Chronic obstructive pulmonary disease, unspecified: Secondary | ICD-10-CM | POA: Diagnosis not present

## 2021-04-06 DIAGNOSIS — I10 Essential (primary) hypertension: Secondary | ICD-10-CM | POA: Diagnosis not present

## 2021-04-11 ENCOUNTER — Encounter: Payer: Self-pay | Admitting: Nurse Practitioner

## 2021-04-11 ENCOUNTER — Ambulatory Visit (INDEPENDENT_AMBULATORY_CARE_PROVIDER_SITE_OTHER): Payer: Medicare Other | Admitting: Nurse Practitioner

## 2021-04-11 VITALS — BP 109/68 | HR 73

## 2021-04-11 DIAGNOSIS — Z0283 Encounter for blood-alcohol and blood-drug test: Secondary | ICD-10-CM | POA: Diagnosis not present

## 2021-04-11 DIAGNOSIS — F419 Anxiety disorder, unspecified: Secondary | ICD-10-CM

## 2021-04-11 DIAGNOSIS — F5104 Psychophysiologic insomnia: Secondary | ICD-10-CM | POA: Diagnosis not present

## 2021-04-11 DIAGNOSIS — Z79899 Other long term (current) drug therapy: Secondary | ICD-10-CM | POA: Diagnosis not present

## 2021-04-11 DIAGNOSIS — K219 Gastro-esophageal reflux disease without esophagitis: Secondary | ICD-10-CM | POA: Diagnosis not present

## 2021-04-11 DIAGNOSIS — I6930 Unspecified sequelae of cerebral infarction: Secondary | ICD-10-CM

## 2021-04-11 DIAGNOSIS — Z23 Encounter for immunization: Secondary | ICD-10-CM | POA: Diagnosis not present

## 2021-04-11 DIAGNOSIS — F339 Major depressive disorder, recurrent, unspecified: Secondary | ICD-10-CM

## 2021-04-11 DIAGNOSIS — I10 Essential (primary) hypertension: Secondary | ICD-10-CM

## 2021-04-11 DIAGNOSIS — I63412 Cerebral infarction due to embolism of left middle cerebral artery: Secondary | ICD-10-CM

## 2021-04-11 MED ORDER — LORAZEPAM 1 MG PO TABS: 1.0000 mg | ORAL_TABLET | Freq: Every day | ORAL | 0 refills | Status: DC

## 2021-04-11 NOTE — Assessment & Plan Note (Signed)
Anxiety well controlled on current medication no changes necessary.  Completed GAD-7. ?

## 2021-04-11 NOTE — Assessment & Plan Note (Signed)
Depression well-controlled on current medication no changes necessary.  Completed PHQ-9 ?

## 2021-04-11 NOTE — Assessment & Plan Note (Signed)
Continue trazodone 100 mg tablet by mouth at bedtime for sleep. ?

## 2021-04-11 NOTE — Patient Instructions (Signed)
Insomnia ?Insomnia is a sleep disorder that makes it difficult to fall asleep or stay asleep. Insomnia can cause fatigue, low energy, difficulty concentrating, mood swings, and poor performance at work or school. ?There are three different ways to classify insomnia: ?Difficulty falling asleep. ?Difficulty staying asleep. ?Waking up too early in the morning. ?Any type of insomnia can be long-term (chronic) or short-term (acute). Both are common. Short-term insomnia usually lasts for three months or less. Chronic insomnia occurs at least three times a week for longer than three months. ?What are the causes? ?Insomnia may be caused by another condition, situation, or substance, such as: ?Anxiety. ?Certain medicines. ?Gastroesophageal reflux disease (GERD) or other gastrointestinal conditions. ?Asthma or other breathing conditions. ?Restless legs syndrome, sleep apnea, or other sleep disorders. ?Chronic pain. ?Menopause. ?Stroke. ?Abuse of alcohol, tobacco, or illegal drugs. ?Mental health conditions, such as depression. ?Caffeine. ?Neurological disorders, such as Alzheimer's disease. ?An overactive thyroid (hyperthyroidism). ?Sometimes, the cause of insomnia may not be known. ?What increases the risk? ?Risk factors for insomnia include: ?Gender. Women are affected more often than men. ?Age. Insomnia is more common as you get older. ?Stress. ?Lack of exercise. ?Irregular work schedule or working night shifts. ?Traveling between different time zones. ?Certain medical and mental health conditions. ?What are the signs or symptoms? ?If you have insomnia, the main symptom is having trouble falling asleep or having trouble staying asleep. This may lead to other symptoms, such as: ?Feeling fatigued or having low energy. ?Feeling nervous about going to sleep. ?Not feeling rested in the morning. ?Having trouble concentrating. ?Feeling irritable, anxious, or depressed. ?How is this diagnosed? ?This condition may be diagnosed  based on: ?Your symptoms and medical history. Your health care provider may ask about: ?Your sleep habits. ?Any medical conditions you have. ?Your mental health. ?A physical exam. ?How is this treated? ?Treatment for insomnia depends on the cause. Treatment may focus on treating an underlying condition that is causing insomnia. Treatment may also include: ?Medicines to help you sleep. ?Counseling or therapy. ?Lifestyle adjustments to help you sleep better. ?Follow these instructions at home: ?Eating and drinking ? ?Limit or avoid alcohol, caffeinated beverages, and cigarettes, especially close to bedtime. These can disrupt your sleep. ?Do not eat a large meal or eat spicy foods right before bedtime. This can lead to digestive discomfort that can make it hard for you to sleep. ?Sleep habits ? ?Keep a sleep diary to help you and your health care provider figure out what could be causing your insomnia. Write down: ?When you sleep. ?When you wake up during the night. ?How well you sleep. ?How rested you feel the next day. ?Any side effects of medicines you are taking. ?What you eat and drink. ?Make your bedroom a dark, comfortable place where it is easy to fall asleep. ?Put up shades or blackout curtains to block light from outside. ?Use a white noise machine to block noise. ?Keep the temperature cool. ?Limit screen use before bedtime. This includes: ?Watching TV. ?Using your smartphone, tablet, or computer. ?Stick to a routine that includes going to bed and waking up at the same times every day and night. This can help you fall asleep faster. Consider making a quiet activity, such as reading, part of your nighttime routine. ?Try to avoid taking naps during the day so that you sleep better at night. ?Get out of bed if you are still awake after 15 minutes of trying to sleep. Keep the lights down, but try reading or doing a   quiet activity. When you feel sleepy, go back to bed. ?General instructions ?Take over-the-counter  and prescription medicines only as told by your health care provider. ?Exercise regularly, as told by your health care provider. Avoid exercise starting several hours before bedtime. ?Use relaxation techniques to manage stress. Ask your health care provider to suggest some techniques that may work well for you. These may include: ?Breathing exercises. ?Routines to release muscle tension. ?Visualizing peaceful scenes. ?Make sure that you drive carefully. Avoid driving if you feel very sleepy. ?Keep all follow-up visits as told by your health care provider. This is important. ?Contact a health care provider if: ?You are tired throughout the day. ?You have trouble in your daily routine due to sleepiness. ?You continue to have sleep problems, or your sleep problems get worse. ?Get help right away if: ?You have serious thoughts about hurting yourself or someone else. ?If you ever feel like you may hurt yourself or others, or have thoughts about taking your own life, get help right away. You can go to your nearest emergency department or call: ?Your local emergency services (911 in the [place] ?A suicide crisis helpline, such as the [place] at 334-159-2532 or 988 in the [place]. This is open 24 hours a day. ?Summary ?Insomnia is a sleep disorder that makes it difficult to fall asleep or stay asleep. ?Insomnia can be long-term (chronic) or short-term (acute). ?Treatment for insomnia depends on the cause. Treatment may focus on treating an underlying condition that is causing insomnia. ?Keep a sleep diary to help you and your health care provider figure out what could be causing your insomnia. ?This information is not intended to replace advice given to you by your health care provider. Make sure you discuss any questions you have with your health care provider. ?Document Revised: 07/14/2020 Document Reviewed: 10/30/2019 ?Elsevier Patient Education ? [place]. ?Generalized Anxiety  Disorder, Adult ?Generalized anxiety disorder (GAD) is a mental health condition. Unlike normal worries, anxiety related to GAD is not triggered by a specific event. These worries do not fade or get better with time. GAD interferes with relationships, work, and school. ?GAD symptoms can vary from mild to severe. People with severe GAD can have intense waves of anxiety with physical symptoms that are similar to panic attacks. ?What are the causes? ?The exact cause of GAD is not known, but the following are believed to have an impact: ?Differences in natural brain chemicals. ?Genes passed down from parents to children. ?Differences in the way threats are perceived. ?Development and stress during childhood. ?Personality. ?What increases the risk? ?The following factors may make you more likely to develop this condition: ?Being male. ?Having a family history of anxiety disorders. ?Being very shy. ?Experiencing very stressful life events, such as the death of a loved one. ?Having a very stressful family environment. ?What are the signs or symptoms? ?People with GAD often worry excessively about many things in their lives, such as their health and family. Symptoms may also include: ?Mental and emotional symptoms: ?Worrying excessively about natural disasters. ?Fear of being late. ?Difficulty concentrating. ?Fears that others are judging your performance. ?Physical symptoms: ?Fatigue. ?Headaches, muscle tension, muscle twitches, trembling, or feeling shaky. ?Feeling like your heart is pounding or beating very fast. ?Feeling out of breath or like you cannot take a deep breath. ?Having trouble falling asleep or staying asleep, or experiencing restlessness. ?Sweating. ?Nausea, diarrhea, or irritable bowel syndrome (IBS). ?Behavioral symptoms: ?Experiencing erratic moods or irritability. ?Avoidance of new  situations. ?Avoidance of people. ?Extreme difficulty making decisions. ?How is this diagnosed? ?This condition is  diagnosed based on your symptoms and medical history. You will also have a physical exam. Your health care provider may perform tests to rule out other possible causes of your symptoms. ?To be diagnosed with GAD, a per

## 2021-04-11 NOTE — Assessment & Plan Note (Signed)
Hypertension well-controlled on current medication no changes necessary. ?

## 2021-04-11 NOTE — Progress Notes (Addendum)
? ?Established Patient Office Visit ? ?Subjective:  ?Patient ID: [name], male    DOB: [birth year]  Age: 87 y.o. MRN: [id number] ? ?CC:  ?Chief Complaint  ?Patient presents with  ? Medical Management of Chronic Issues  ? Anxiety  ? ? ?HPI ?[name] presents for follow up of hypertension. Patient was diagnosed in 02/09/2010. The patient is tolerating the medication well without side effects. Compliance with treatment has been good; including taking medication as directed , maintains a healthy diet, and following up as directed.  ? ?Anxiety, Follow-up ? ?He was last seen for anxiety 3 months ago. ?Changes made at last visit include no changes made patient continued on Ativan 1 mg tablet by mouth at bedtime.. ?  ?He reports good compliance with treatment. ?He reports good tolerance of treatment. ?He is not having side effects.  ? ?He feels his anxiety is moderate and Improved since last visit. ? ?Symptoms: ?No chest pain No difficulty concentrating  ?No dizziness No fatigue  ?No feelings of losing control Yes insomnia  ?No irritable No palpitations  ?No panic attacks No racing thoughts  ?No shortness of breath No sweating  ?No tremors/shakes   ? ?GAD-7 Results ? ?  01/07/2021  ? 11:23 AM 10/25/2020  ? 11:28 AM  ?GAD-7 Generalized Anxiety Disorder Screening Tool  ?1. Feeling Nervous, Anxious, or on Edge 2 0  ?2. Not Being Able to Stop or Control Worrying 2 0  ?3. Worrying Too Much About Different Things 2 0  ?4. Trouble Relaxing 2 0  ?5. Being So Restless it's Hard To Sit Still 2 0  ?6. Becoming Easily Annoyed or Irritable 2 0  ?7. Feeling Afraid As If Something Awful Might Happen 0 0  ?Total GAD-7 Score 12 0  ?Difficulty At Work, Home, or Getting  Along With Others? Somewhat difficult   ? ? ?PHQ-9 Scores ? ?  01/07/2021  ? 11:22 AM 10/25/2020  ? 11:28 AM 08/31/2020  ? 11:46 AM  ?PHQ9 SCORE ONLY  ?PHQ-9 Total Score 5 0 9  ? ? ?Insomnia: Patient's insomnia well controlled on current medication no changes  necessary. ? ? ?[place] Office Visit from 01/07/2021 in [facility]  ?PHQ-9 Total Score 5  ? ?  ?  ? ?Past Medical History:  ?Diagnosis Date  ? Abnormality of gait 08/01/2013  ? Arthritis   ? Blindness of left eye   ? Posttraumatic  ? Carotid artery disease ([place])   ? Colon polyp   ? Constipation   ? Diaphragmatic hernia   ? Disc disease, degenerative, cervical   ? Diverticulitis   ? ED (erectile dysfunction)   ? Gastroparesis   ? GERD (gastroesophageal reflux disease)   ? Glaucoma   ? Heart disease   ? Hyperlipidemia   ? Hypertension   ? Insomnia   ? Memory difficulty 08/04/2014  ? Nicotine dependence   ? Numbness and tingling of right arm 08/04/2014  ? Osteoarthritis   ? Overactive bladder   ? Radiculopathy   ? Shortness of breath   ? with activity  ? Sleep apnea   ? does not use CPAP.  Tested maybe 5- 10 years ago  ? Stroke [facility])   ? Left side weakness.  ? Unilateral inguinal hernia   ? ? ?Past Surgical History:  ?Procedure Laterality Date  ? ANTERIOR CERVICAL DECOMP/DISCECTOMY FUSION N/A 07/01/2012  ? Procedure: ANTERIOR CERVICAL DECOMPRESSION/DISCECTOMY FUSION CERVICAL FIVE-SIX [name] REMOVAL CERVICAL SIX-SEVEN;  Surgeon: [name], MD;  Location: [facility];  Service: Neurosurgery;  Laterality: N/A;  ? CERVICAL FUSION    ? COLONOSCOPY   08/04/2002    ? RMR: Normal rectum/Pancolonic diverticula/Colonic polyps as described above, biopsied and/or snared  ? COLONOSCOPY  60/08/2009  ? RMR: normal rectum/left and right sided diverticula/multiple colonic polyps. tubular adenomas. surveillance due 2014  ? COLONOSCOPY WITH ESOPHAGOGASTRODUODENOSCOPY (EGD) N/A 12/18/2012  ? Dr. [name]:  Colonic diverticulosis. Multiple colonic polyps-hyperplastic polyps and tubular adenoma. Friable anal canal hemorrhoids. EGD with chronic atrophic gastritis  ? ESOPHAGOGASTRODUODENOSCOPY  04/14/2002  ? BPP:[id number]' ring, status post dilation as described above/Hiatal hernia, focal antral erosions of  uncertain clinical significance  ? ESOPHAGOGASTRODUODENOSCOPY  06/09/2009  ? RMR: normal esophagus s/p dilator/small hiatal hernia otherwise normal  ? HERNIA REPAIR Right   ? inguinal  ? I & D EXTREMITY Right 04/23/2017  ? Procedure: IRRIGATION AND DEBRIDEMENT HAND;  Surgeon: [name], MD;  Location: [place];  Service: Orthopedics;  Laterality: Right;  ? INGUINAL HERNIA REPAIR Left 04/15/2014  ? Procedure: LEFT INGUINAL HERNIORRHAPHY WITH MESH;  Surgeon: [name], MD;  Location: [facility];  Service: General;  Laterality: Left;  ? INSERTION OF MESH Left 04/15/2014  ? Procedure: INSERTION OF MESH;  Surgeon: [name], MD;  Location: [facility];  Service: General;  Laterality: Left;  ? ? ?Family History  ?Problem Relation Age of Onset  ? Cancer Mother   ? Dementia Father   ? Colon cancer Neg Hx   ? ? ?Social History  ? ?Socioeconomic History  ? Marital status: Widowed  ?  Spouse name: Not on file  ? Number of children: 2  ? Years of education: hs  ? Highest education level: Not on file  ?Occupational History  ? Occupation: Retired  ?  Employer: RETIRED  ?Tobacco Use  ? Smoking status: Every Day  ?  Packs/day: 0.25  ?  Years: 60.00  ?  Pack years: 15.00  ?  Types: Cigarettes  ? Smokeless tobacco: Never  ? Tobacco comments:  ?  using patch  ?Vaping Use  ? Vaping Use: Never used  ?Substance and Sexual Activity  ? Alcohol use: No  ? Drug use: No  ? Sexual activity: Not Currently  ?Other Topics Concern  ? Not on file  ?Social History Narrative  ? Patient drinks 1-2 cups of caffeine daily.  ? Patient is right handed.  ? Lives with his sister [name]  ? Has a nurse/ Caregiver, [name] to help with bed baths, dressing, meals, etc 5 days per week  ? ?Social Determinants of Health  ? ?Financial Resource Strain: Low Risk   ? Difficulty of Paying Living Expenses: Not very hard  ?Food Insecurity: No Food Insecurity  ? Worried About Charity fundraiser in the Last Year: Never true  ? Ran Out of Food in the Last  Year: Never true  ?Transportation Needs: No Transportation Needs  ? Lack of Transportation (Medical): No  ? Lack of Transportation (Non-Medical): No  ?Physical Activity: Inactive  ? Days of Exercise per Week: 0 days  ? Minutes of Exercise per Session: 0 min  ?Stress: No Stress Concern Present  ? Feeling of Stress : Not at all  ?Social Connections: Moderately Isolated  ? Frequency of Communication with Friends and Family: Twice a week  ? Frequency of Social Gatherings with Friends and Family: More than three times a week  ? Attends Religious Services: More than 4 times per year  ?  Active Member of Clubs or Organizations: No  ? Attends Archivist Meetings: Never  ? Marital Status: Widowed  ?Intimate Partner Violence: Not At Risk  ? Fear of Current or Ex-Partner: No  ? Emotionally Abused: No  ? Physically Abused: No  ? Sexually Abused: No  ? ? ?Outpatient Medications Prior to Visit  ?Medication Sig Dispense Refill  ? albuterol (PROVENTIL) (2.5 MG/3ML) 0.083% nebulizer solution Take 3 mLs (2.5 mg total) by nebulization every 6 (six) hours as needed for wheezing or shortness of breath. 150 mL 1  ? albuterol (VENTOLIN HFA) 108 (90 Base) MCG/ACT inhaler Inhale 2 puffs into the lungs every 4 (four) hours as needed for wheezing or shortness of breath. 1 each 0  ? amLODipine (NORVASC) 5 MG tablet TAKE (1) TABLET BY MOUTH AT BEDTIME. 30 tablet 2  ? aspirin 325 MG tablet Take 1 tablet (325 mg total) by mouth daily. 30 tablet 5  ? aspirin EC 325 MG tablet TAKE 1 TABLET BY MOUTH ONCE A DAY. 30 tablet 1  ? atorvastatin (LIPITOR) 40 MG tablet TAKE (1) TABLET BY MOUTH AT BEDTIME. 30 tablet 5  ? Cholecalciferol (VITAMIN D-3) 25 MCG (1000 UT) CAPS Take 1 capsule (1,000 Units total) by mouth daily. 30 capsule 5  ? clotrimazole-betamethasone (LOTRISONE) cream APPLY 1 APPLICATION TOPICALLY TWICE DAILY. 60 g 0  ? fluticasone (FLONASE) 50 MCG/ACT nasal spray USE 1 OR 2 SPRAYS IN EACH NOSTRIL EVERY DAY FOR NASAL ALLERGIES. 16 g 3   ? levETIRAcetam (KEPPRA) 250 MG tablet TAKE (1) TABLET BY MOUTH EACH MORNING. 30 tablet 2  ? lisinopril (ZESTRIL) 10 MG tablet TAKE 1 TABLET BY MOUTH ONCE A DAY FOR HIGH BLOOD PRESSURE. TAKE W/'20MG'$  TABLET TO EQUAL '30MG'$ . 30 tab

## 2021-04-11 NOTE — Assessment & Plan Note (Signed)
No new symptoms follow-up in 3 months. ?

## 2021-04-13 LAB — DRUG SCREEN 10 W/CONF, SERUM
Amphetamines, IA: NEGATIVE ng/mL
Barbiturates, IA: NEGATIVE ug/mL
Benzodiazepines, IA: NEGATIVE ng/mL
Cocaine & Metabolite, IA: NEGATIVE ng/mL
Methadone, IA: NEGATIVE ng/mL
Opiates, IA: NEGATIVE ng/mL
Oxycodones, IA: NEGATIVE ng/mL
Phencyclidine, IA: NEGATIVE ng/mL
Propoxyphene, IA: NEGATIVE ng/mL
THC(Marijuana) Metabolite, IA: NEGATIVE ng/mL

## 2021-04-15 ENCOUNTER — Other Ambulatory Visit: Payer: Self-pay | Admitting: Nurse Practitioner

## 2021-04-16 NOTE — Assessment & Plan Note (Signed)
Patient is not having any new or worsening sighs of CVA, symptoms are well controlled on current medication plan. Patient is following up as recommended. ?

## 2021-05-03 DIAGNOSIS — I10 Essential (primary) hypertension: Secondary | ICD-10-CM | POA: Diagnosis not present

## 2021-05-03 DIAGNOSIS — J449 Chronic obstructive pulmonary disease, unspecified: Secondary | ICD-10-CM | POA: Diagnosis not present

## 2021-05-31 DIAGNOSIS — I739 Peripheral vascular disease, unspecified: Secondary | ICD-10-CM | POA: Diagnosis not present

## 2021-05-31 DIAGNOSIS — B351 Tinea unguium: Secondary | ICD-10-CM | POA: Diagnosis not present

## 2021-05-31 DIAGNOSIS — L110 Acquired keratosis follicularis: Secondary | ICD-10-CM | POA: Diagnosis not present

## 2021-06-02 DIAGNOSIS — I10 Essential (primary) hypertension: Secondary | ICD-10-CM | POA: Diagnosis not present

## 2021-06-02 DIAGNOSIS — J449 Chronic obstructive pulmonary disease, unspecified: Secondary | ICD-10-CM | POA: Diagnosis not present

## 2021-06-14 ENCOUNTER — Other Ambulatory Visit: Payer: Self-pay | Admitting: Nurse Practitioner

## 2021-06-14 DIAGNOSIS — F5104 Psychophysiologic insomnia: Secondary | ICD-10-CM

## 2021-06-22 ENCOUNTER — Other Ambulatory Visit: Payer: Self-pay | Admitting: Nurse Practitioner

## 2021-07-04 DIAGNOSIS — I10 Essential (primary) hypertension: Secondary | ICD-10-CM | POA: Diagnosis not present

## 2021-07-04 DIAGNOSIS — J449 Chronic obstructive pulmonary disease, unspecified: Secondary | ICD-10-CM | POA: Diagnosis not present

## 2021-07-12 ENCOUNTER — Ambulatory Visit (INDEPENDENT_AMBULATORY_CARE_PROVIDER_SITE_OTHER): Payer: Medicare Other | Admitting: Nurse Practitioner

## 2021-07-12 ENCOUNTER — Encounter: Payer: Self-pay | Admitting: Nurse Practitioner

## 2021-07-12 VITALS — BP 112/71 | HR 77 | Temp 98.9°F | Ht 69.0 in

## 2021-07-12 DIAGNOSIS — R300 Dysuria: Secondary | ICD-10-CM | POA: Diagnosis not present

## 2021-07-12 DIAGNOSIS — F321 Major depressive disorder, single episode, moderate: Secondary | ICD-10-CM

## 2021-07-12 DIAGNOSIS — I10 Essential (primary) hypertension: Secondary | ICD-10-CM | POA: Diagnosis not present

## 2021-07-12 DIAGNOSIS — Z23 Encounter for immunization: Secondary | ICD-10-CM

## 2021-07-12 DIAGNOSIS — F5104 Psychophysiologic insomnia: Secondary | ICD-10-CM | POA: Diagnosis not present

## 2021-07-12 DIAGNOSIS — K219 Gastro-esophageal reflux disease without esophagitis: Secondary | ICD-10-CM

## 2021-07-12 DIAGNOSIS — E559 Vitamin D deficiency, unspecified: Secondary | ICD-10-CM

## 2021-07-12 DIAGNOSIS — Z0000 Encounter for general adult medical examination without abnormal findings: Secondary | ICD-10-CM

## 2021-07-12 LAB — URINALYSIS, ROUTINE W REFLEX MICROSCOPIC
Bilirubin, UA: NEGATIVE
Glucose, UA: NEGATIVE
Leukocytes,UA: NEGATIVE
Nitrite, UA: NEGATIVE
RBC, UA: NEGATIVE
Specific Gravity, UA: 1.02 (ref 1.005–1.030)
Urobilinogen, Ur: 1 mg/dL (ref 0.2–1.0)
pH, UA: 5.5 (ref 5.0–7.5)

## 2021-07-12 LAB — MICROSCOPIC EXAMINATION
Epithelial Cells (non renal): NONE SEEN /hpf (ref 0–10)
Renal Epithel, UA: NONE SEEN /hpf

## 2021-07-12 NOTE — Assessment & Plan Note (Signed)
Depression well-controlled on current medication no changes necessary.  Completed PHQ-9 follow-up as needed. [name]

## 2021-07-12 NOTE — Assessment & Plan Note (Signed)
Hypertension well-controlled on current medication.  No changes necessary to current dose.  Continue low-sodium diet.

## 2021-07-12 NOTE — Patient Instructions (Signed)
Hypertension, Adult ?Hypertension is another name for high blood pressure. High blood pressure forces your heart to work harder to pump blood. This can cause problems over time. ?There are two numbers in a blood pressure reading. There is a top number (systolic) over a bottom number (diastolic). It is best to have a blood pressure that is below 120/80. ?What are the causes? ?The cause of this condition is not known. Some other conditions can lead to high blood pressure. ?What increases the risk? ?Some lifestyle factors can make you more likely to develop high blood pressure: ?Smoking. ?Not getting enough exercise or physical activity. ?Being overweight. ?Having too much fat, sugar, calories, or salt (sodium) in your diet. ?Drinking too much alcohol. ?Other risk factors include: ?Having any of these conditions: ?Heart disease. ?Diabetes. ?High cholesterol. ?Kidney disease. ?Obstructive sleep apnea. ?Having a family history of high blood pressure and high cholesterol. ?Age. The risk increases with age. ?Stress. ?What are the signs or symptoms? ?High blood pressure may not cause symptoms. Very high blood pressure (hypertensive crisis) may cause: ?Headache. ?Fast or uneven heartbeats (palpitations). ?Shortness of breath. ?Nosebleed. ?Vomiting or feeling like you may vomit (nauseous). ?Changes in how you see. ?Very bad chest pain. ?Feeling dizzy. ?Seizures. ?How is this treated? ?This condition is treated by making healthy lifestyle changes, such as: ?Eating healthy foods. ?Exercising more. ?Drinking less alcohol. ?Your doctor may prescribe medicine if lifestyle changes do not help enough and if: ?Your top number is above 130. ?Your bottom number is above 80. ?Your personal target blood pressure may vary. ?Follow these instructions at home: ?Eating and drinking ? ?If told, follow the DASH eating plan. To follow this plan: ?Fill one half of your plate at each meal with fruits and vegetables. ?Fill one fourth of your plate  at each meal with whole grains. Whole grains include whole-wheat pasta, brown rice, and whole-grain bread. ?Eat or drink low-fat dairy products, such as skim milk or low-fat yogurt. ?Fill one fourth of your plate at each meal with low-fat (lean) proteins. Low-fat proteins include fish, chicken without skin, eggs, beans, and tofu. ?Avoid fatty meat, cured and processed meat, or chicken with skin. ?Avoid pre-made or processed food. ?Limit the amount of salt in your diet to less than 1,500 mg each day. ?Do not drink alcohol if: ?Your doctor tells you not to drink. ?You are pregnant, may be pregnant, or are planning to become pregnant. ?If you drink alcohol: ?Limit how much you have to: ?0-1 drink a day for women. ?0-2 drinks a day for men. ?Know how much alcohol is in your drink. In the [place] one drink equals one 12 oz bottle of beer (355 mL), one 5 oz glass of wine (148 mL), or one 1? oz glass of hard liquor (44 mL). ?Lifestyle ? ?Work with your doctor to stay at a healthy weight or to lose weight. Ask your doctor what the best weight is for you. ?Get at least 30 minutes of exercise that causes your heart to beat faster (aerobic exercise) most days of the week. This may include walking, swimming, or biking. ?Get at least 30 minutes of exercise that strengthens your muscles (resistance exercise) at least 3 days a week. This may include lifting weights or doing Pilates. ?Do not smoke or use any products that contain nicotine or tobacco. If you need help quitting, ask your doctor. ?Check your blood pressure at home as told by your doctor. ?Keep all follow-up visits. ?Medicines ?Take over-the-counter and prescription medicines   only as told by your doctor. Follow directions carefully. ?Do not skip doses of blood pressure medicine. The medicine does not work as well if you skip doses. Skipping doses also puts you at risk for problems. ?Ask your doctor about side effects or reactions to medicines that you should watch  for. ?Contact a doctor if: ?You think you are having a reaction to the medicine you are taking. ?You have headaches that keep coming back. ?You feel dizzy. ?You have swelling in your ankles. ?You have trouble with your vision. ?Get help right away if: ?You get a very bad headache. ?You start to feel mixed up (confused). ?You feel weak or numb. ?You feel faint. ?You have very bad pain in your: ?Chest. ?Belly (abdomen). ?You vomit more than once. ?You have trouble breathing. ?These symptoms may be an emergency. Get help right away. Call 911. ?Do not wait to see if the symptoms will go away. ?Do not drive yourself to the hospital. ?Summary ?Hypertension is another name for high blood pressure. ?High blood pressure forces your heart to work harder to pump blood. ?For most people, a normal blood pressure is less than 120/80. ?Making healthy choices can help lower blood pressure. If your blood pressure does not get lower with healthy choices, you may need to take medicine. ?This information is not intended to replace advice given to you by your health care provider. Make sure you discuss any questions you have with your health care provider. ?Document Revised: 10/07/2020 Document Reviewed: 10/07/2020 ?Elsevier Patient Education ? 2023 [employer] ? ?

## 2021-07-12 NOTE — Progress Notes (Signed)
Established Patient Office Visit  Subjective   Patient ID: [name], male    DOB: December 23, 1934  Age: 87 y.o. MRN: [id number]  Chief Complaint  Patient presents with   Medical Management of Chronic Issues    Dysuria  This is a new problem. The current episode started 1 to 4 weeks ago. The problem occurs intermittently. The problem has been unchanged. The patient is experiencing no pain. There has been no fever. Associated symptoms include frequency. Pertinent negatives include no chills, discharge, flank pain, nausea or vomiting. He has tried nothing for the symptoms.    Pt presents for follow up of hypertension. Patient was diagnosed in 09/09/2008.  The patient is tolerating the medication well without side effects. Compliance with treatment has been good; including taking medication as directed , maintains a healthy diet and regular exercise regimen , and following up as directed.   GERD, Follow up:  The patient was last seen for GERD 3 months ago. Changes made since that visit include no changes made.  He reports good compliance with treatment. He is not having side effects. [name]  He IS experiencing heartburn. He is NOT experiencing belching, belching and eructation, deep pressure at base of neck, hoarseness, or laryngitis  -----------------------------------------------------------------------------------------  Depression, Follow-up  He  was last seen for this 6 months ago. Changes made at last visit include .   He reports good compliance with treatment. He is not having side effects.   He reports good tolerance of treatment. Current symptoms include: depressed mood He feels he is Improved from since last visit.     01/07/2021   11:22 AM 10/25/2020   11:28 AM 08/31/2020   11:46 AM  Depression screen PHQ 2/9  Decreased Interest 2 0 1  Down, Depressed, Hopeless 0 0 1  PHQ - 2 Score 2 0 2  Altered sleeping 0  1  Tired, decreased energy 2  1  Change in appetite 0  1   Feeling bad or failure about yourself  0  0  Trouble concentrating 0  2  Moving slowly or fidgety/restless 1  2  Suicidal thoughts 0  0  PHQ-9 Score 5  9  Difficult doing work/chores Somewhat difficult  Somewhat difficult    -----------------------------------------------------------------------------------------   Patient Active Problem List   Diagnosis Date Noted   Anxiety 01/07/2021   Elevated PSA 10/28/2020   Urinary retention 10/25/2020   Abnormal urine odor 04/21/2020   Depression, recurrent ([place]) 04/21/2020   Cerebrovascular accident (CVA) due to embolism of left middle cerebral artery ([place]) 12/14/2018   Abnormal findings on diagnostic imaging of lung 08/19/2018   IPF (idiopathic pulmonary fibrosis) ([place]) 08/19/2018   OSA (obstructive sleep apnea) 08/19/2018   Carotid artery disease ([place]) 03/26/2017   Internal carotid artery stenosis, left 03/06/2017   Dysphagia 03/06/2017   Pulmonary nodules 03/06/2017   Seizure ([place]) 03/03/2017   Protein-calorie malnutrition ([place]) 01/10/2017   Chronic pain 10/02/2016   Depression, major, single episode, moderate ([place]) 08/31/2016   Chronic insomnia 08/31/2016   Lumbar spinal stenosis 08/18/2016   Wheelchair bound 08/18/2016   Dementia ([place]) 08/18/2016   Urinary incontinence 08/18/2016   Polypharmacy 08/18/2016   Laryngopharyngeal reflux (LPR) 08/12/2015   Memory difficulty 08/04/2014   Numbness and tingling of right arm 08/04/2014   Abnormality of gait 08/01/2013   Anemia 12/05/2012   Early satiety 12/03/2012   History of colon polyps 12/03/2012   Facial droop 10/12/2012   Spinal stenosis of cervical region 07/01/2012  GERD 05/27/2009   DYSPHAGIA UNSPECIFIED 05/27/2009   CHANGE IN BOWELS 05/27/2009   SMOKER 09/09/2008   Essential hypertension 09/09/2008   History of stroke 09/09/2008   Constipation 09/09/2008   HIGH BLOOD PRESSURE 07/25/2006   Past Medical History:  Diagnosis Date   Abnormality of gait 08/01/2013    Arthritis    Blindness of left eye    Posttraumatic   Carotid artery disease (HCC)    Colon polyp    Constipation    Diaphragmatic hernia    Disc disease, degenerative, cervical    Diverticulitis    ED (erectile dysfunction)    Gastroparesis    GERD (gastroesophageal reflux disease)    Glaucoma    Heart disease    Hyperlipidemia    Hypertension    Insomnia    Memory difficulty 08/04/2014   Nicotine dependence    Numbness and tingling of right arm 08/04/2014   Osteoarthritis    Overactive bladder    Radiculopathy    Shortness of breath    with activity   Sleep apnea    does not use CPAP.  Tested maybe 5- 10 years ago   Stroke [facility])    Left side weakness.   Unilateral inguinal hernia    Past Surgical History:  Procedure Laterality Date   ANTERIOR CERVICAL DECOMP/DISCECTOMY FUSION N/A 07/01/2012   Procedure: ANTERIOR CERVICAL DECOMPRESSION/DISCECTOMY FUSION CERVICAL FIVE-SIX [name] REMOVAL CERVICAL SIX-SEVEN;  Surgeon: [name], MD;  Location: [place] [facility];  Service: Neurosurgery;  Laterality: N/A;   CERVICAL FUSION     COLONOSCOPY   08/04/2002     RMR: Normal rectum/Pancolonic diverticula/Colonic polyps as described above, biopsied and/or snared   COLONOSCOPY  60/08/2009   RMR: normal rectum/left and right sided diverticula/multiple colonic polyps. tubular adenomas. surveillance due 2014   COLONOSCOPY WITH ESOPHAGOGASTRODUODENOSCOPY (EGD) N/A 12/18/2012   Dr. [name]:  Colonic diverticulosis. Multiple colonic polyps-hyperplastic polyps and tubular adenoma. Friable anal canal hemorrhoids. EGD with chronic atrophic gastritis   ESOPHAGOGASTRODUODENOSCOPY  04/14/2002   IRC:VELFYBOF'B' ring, status post dilation as described above/Hiatal hernia, focal antral erosions of uncertain clinical significance   ESOPHAGOGASTRODUODENOSCOPY  06/09/2009   RMR: normal esophagus s/p dilator/small hiatal hernia otherwise normal   HERNIA REPAIR Right    inguinal   I & D EXTREMITY Right  04/23/2017   Procedure: [place] HAND;  Surgeon: [name], MD;  Location: [place];  Service: Orthopedics;  Laterality: Right;   INGUINAL HERNIA REPAIR Left 04/15/2014   Procedure: LEFT INGUINAL HERNIORRHAPHY WITH MESH;  Surgeon: [name], MD;  Location: [facility];  Service: General;  Laterality: Left;   INSERTION OF MESH Left 04/15/2014   Procedure: INSERTION OF MESH;  Surgeon: [name], MD;  Location: [facility];  Service: General;  Laterality: Left;   Social History   Tobacco Use   Smoking status: Every Day    Packs/day: 0.25    Years: 60.00    Total pack years: 15.00    Types: Cigarettes   Smokeless tobacco: Never   Tobacco comments:    using patch  Vaping Use   Vaping Use: Never used  Substance Use Topics   Alcohol use: No   Drug use: No   Social History   Socioeconomic History   Marital status: Widowed    Spouse name: Not on file   Number of children: 2   Years of education: hs   Highest education level: Not on file  Occupational History   Occupation: Retired  Employer: RETIRED  Tobacco Use   Smoking status: Every Day    Packs/day: 0.25    Years: 60.00    Total pack years: 15.00    Types: Cigarettes   Smokeless tobacco: Never   Tobacco comments:    using patch  Vaping Use   Vaping Use: Never used  Substance and Sexual Activity   Alcohol use: No   Drug use: No   Sexual activity: Not Currently  Other Topics Concern   Not on file  Social History Narrative   Patient drinks 1-2 cups of caffeine daily.   Patient is right handed.   Lives with his sister [name]   Has a nurse/ Caregiver, [name] Scales to help with bed baths, dressing, meals, etc 5 days per week   Social Determinants of Health   Financial Resource Strain: Low Risk  (08/31/2020)   Overall Financial Resource Strain (CARDIA)    Difficulty of Paying Living Expenses: Not very hard  Food Insecurity: No Food Insecurity (08/31/2020)   Hunger Vital Sign    Worried  About Running Out of Food in the Last Year: Never true    Ran Out of Food in the Last Year: Never true  Transportation Needs: No Transportation Needs (08/31/2020)   PRAPARE - Hydrologist (Medical): No    Lack of Transportation (Non-Medical): No  Physical Activity: Inactive (08/31/2020)   Exercise Vital Sign    Days of Exercise per Week: 0 days    Minutes of Exercise per Session: 0 min  Stress: No Stress Concern Present (08/31/2020)   [place]    Feeling of Stress : Not at all  Social Connections: Moderately Isolated (08/31/2020)   Social Connection and Isolation Panel [NHANES]    Frequency of Communication with Friends and Family: Twice a week    Frequency of Social Gatherings with Friends and Family: More than three times a week    Attends Religious Services: More than 4 times per year    Active Member of [employer] or Organizations: No    Attends Archivist Meetings: Never    Marital Status: Widowed  Intimate Partner Violence: Not At Risk (08/31/2020)   Humiliation, Afraid, Rape, and Kick questionnaire    Fear of Current or Ex-Partner: No    Emotionally Abused: No    Physically Abused: No    Sexually Abused: No   Family Status  Relation Name Status   Mother  Deceased at age 54   Father  Deceased at age 60       unknown   Sister  Alive   Brother  Alive   Brother  Alive   Neg Hx  (Not Specified)   Family History  Problem Relation Age of Onset   Cancer Mother    Dementia Father    Colon cancer Neg Hx    No Known Allergies    Review of Systems  Constitutional: Negative.  Negative for chills.  HENT: Negative.    Eyes: Negative.   Respiratory: Negative.    Cardiovascular: Negative.   Gastrointestinal:  Negative for nausea and vomiting.  Genitourinary:  Positive for dysuria and frequency. Negative for flank pain.  Musculoskeletal:        Patient is in a wheelchair   Skin: Negative.   Psychiatric/Behavioral:  Positive for depression. The patient is nervous/anxious.   All other systems reviewed and are negative.     Objective:  BP 112/71   Pulse 77   Temp 98.9 F (37.2 C)   Ht 5' 9" (1.753 m)   SpO2 97%   BMI 23.92 kg/m  BP Readings from Last 3 Encounters:  07/12/21 112/71  04/11/21 109/68  01/07/21 (!) 141/76   Wt Readings from Last 3 Encounters:  08/31/20 162 lb (73.5 kg)  02/11/20 162 lb (73.5 kg)  11/13/17 170 lb (77.1 kg)      Physical Exam Vitals and nursing note reviewed.  Constitutional:      Appearance: Normal appearance.  HENT:     Head: Normocephalic.     Right Ear: External ear normal.     Left Ear: External ear normal.     Nose: Nose normal.     Mouth/Throat:     Mouth: Mucous membranes are moist.     Pharynx: Oropharynx is clear.  Eyes:     Conjunctiva/sclera: Conjunctivae normal.  Cardiovascular:     Rate and Rhythm: Normal rate and regular rhythm.     Pulses: Normal pulses.     Heart sounds: Normal heart sounds.  Pulmonary:     Effort: Pulmonary effort is normal.     Breath sounds: Normal breath sounds.  Abdominal:     General: Bowel sounds are normal.  Skin:    General: Skin is warm.     Findings: No erythema or rash.  Neurological:     General: No focal deficit present.     Mental Status: He is alert and oriented to person, place, and time.  Psychiatric:        Attention and Perception: Attention and perception normal.        Mood and Affect: Mood is anxious and depressed.        Behavior: Behavior normal.      Results for orders placed or performed in visit on 07/12/21  Microscopic Examination   Urine  Result Value Ref Range   WBC, UA 0-5 0 - 5 /hpf   RBC, Urine 0-2 0 - 2 /hpf   Epithelial Cells (non renal) None seen 0 - 10 /hpf   Renal Epithel, UA None seen None seen /hpf   Bacteria, UA Few (A) None seen/Few  Urinalysis, Routine w reflex microscopic  Result Value Ref Range    Specific Gravity, UA 1.020 1.005 - 1.030   pH, UA 5.5 5.0 - 7.5   Color, UA Yellow Yellow   Appearance Ur Clear Clear   Leukocytes,UA Negative Negative   Protein,UA Trace (A) Negative/Trace   Glucose, UA WILL FOLLOW    Ketones, UA Trace (A) Negative   RBC, UA WILL FOLLOW    Bilirubin, UA WILL FOLLOW    Urobilinogen, Ur 1.0 0.2 - 1.0 mg/dL   Nitrite, UA Negative Negative   Microscopic Examination See below:     Last CBC Lab Results  Component Value Date   WBC 7.1 10/25/2020   HGB 14.0 10/25/2020   HCT 41.5 10/25/2020   MCV 91 10/25/2020   MCH 30.6 10/25/2020   RDW 13.3 10/25/2020   PLT 205 93/79/0240   Last metabolic panel Lab Results  Component Value Date   GLUCOSE 92 10/25/2020   NA 142 10/25/2020   K 4.3 10/25/2020   CL 105 10/25/2020   CO2 21 10/25/2020   BUN 13 10/25/2020   CREATININE 0.82 10/25/2020   EGFR 87 10/25/2020   CALCIUM 9.3 10/25/2020   PROT 7.4 10/25/2020   ALBUMIN 4.3 10/25/2020   LABGLOB 3.1 10/25/2020  AGRATIO 1.4 10/25/2020   BILITOT 0.4 10/25/2020   ALKPHOS 132 (H) 10/25/2020   AST 16 10/25/2020   ALT 7 10/25/2020   ANIONGAP 7 04/25/2017   Last lipids Lab Results  Component Value Date   CHOL 116 10/25/2020   HDL 63 10/25/2020   LDLCALC 40 10/25/2020   TRIG 61 10/25/2020   CHOLHDL 1.8 10/25/2020   Last vitamin D No results found for: "25OHVITD2", "25OHVITD3", "VD25OH"    The ASCVD Risk score (Arnett DK, et al., 2019) failed to calculate for the following reasons:   The 2019 ASCVD risk score is only valid for ages 64 to 67   The patient has a prior MI or stroke diagnosis    Assessment & Plan:   UTI  vs  interstitial cystitis -urinalysis completed results pending -pyridium for pain Precaution and education provided -All questions answered -follow up with unresolved symptoms  Problem List Items Addressed This Visit       Cardiovascular and Mediastinum   Essential hypertension - Primary    Hypertension well-controlled on  current medication.  No changes necessary to current dose.  Continue low-sodium diet.      Relevant Orders   Lipid panel   CBC with Differential/Platelet   CMP14+EGFR     Digestive   GERD    Symptoms well controlled no changes necessary.  Follow-up as needed.        Other   Depression, major, single episode, moderate ([place])    Depression well-controlled on current medication no changes necessary.  Completed PHQ-9 follow-up as needed. .      Chronic insomnia    Insomnia well managed and controlled on current medication no changes necessary.  Rx refill completed.      Other Visit Diagnoses     Vitamin D deficiency       Relevant Orders   Vitamin D, 25-hydroxy   Dysuria       Relevant Orders   PSA, total and free   Urinalysis, Routine w reflex microscopic (Completed)   CULTURE, URINE COMPREHENSIVE       Return in about 3 months (around 10/12/2021) for chronic disease management.    Ivy Lynn, NP

## 2021-07-12 NOTE — Assessment & Plan Note (Signed)
Symptoms well controlled no changes necessary.  Follow-up as needed.

## 2021-07-12 NOTE — Assessment & Plan Note (Signed)
Insomnia well managed and controlled on current medication no changes necessary.  Rx refill completed.

## 2021-07-13 ENCOUNTER — Other Ambulatory Visit: Payer: Self-pay | Admitting: Nurse Practitioner

## 2021-07-13 DIAGNOSIS — R972 Elevated prostate specific antigen [PSA]: Secondary | ICD-10-CM

## 2021-07-13 LAB — CMP14+EGFR
ALT: 9 IU/L (ref 0–44)
AST: 18 IU/L (ref 0–40)
Albumin/Globulin Ratio: 1.3 (ref 1.2–2.2)
Albumin: 3.9 g/dL (ref 3.7–4.7)
Alkaline Phosphatase: 99 IU/L (ref 44–121)
BUN/Creatinine Ratio: 11 (ref 10–24)
BUN: 12 mg/dL (ref 8–27)
Bilirubin Total: 0.4 mg/dL (ref 0.0–1.2)
CO2: 24 mmol/L (ref 20–29)
Calcium: 8.8 mg/dL (ref 8.6–10.2)
Chloride: 105 mmol/L (ref 96–106)
Creatinine, Ser: 1.08 mg/dL (ref 0.76–1.27)
Globulin, Total: 3.1 g/dL (ref 1.5–4.5)
Glucose: 98 mg/dL (ref 70–99)
Potassium: 3.8 mmol/L (ref 3.5–5.2)
Sodium: 142 mmol/L (ref 134–144)
Total Protein: 7 g/dL (ref 6.0–8.5)
eGFR: 67 mL/min/{1.73_m2} (ref >59)

## 2021-07-13 LAB — LIPID PANEL
Chol/HDL Ratio: 2 ratio (ref 0.0–5.0)
Cholesterol, Total: 113 mg/dL (ref 100–199)
HDL: 56 mg/dL (ref >39)
LDL Chol Calc (NIH): 41 mg/dL (ref 0–99)
Triglycerides: 83 mg/dL (ref 0–149)
VLDL Cholesterol Cal: 16 mg/dL (ref 5–40)

## 2021-07-13 LAB — VITAMIN D 25 HYDROXY (VIT D DEFICIENCY, FRACTURES): Vit D, 25-Hydroxy: 38 ng/mL (ref 30.0–100.0)

## 2021-07-13 LAB — CBC WITH DIFFERENTIAL/PLATELET
Basophils Absolute: 0.1 10*3/uL (ref 0.0–0.2)
Basos: 1 %
EOS (ABSOLUTE): 0.8 10*3/uL — ABNORMAL HIGH (ref 0.0–0.4)
Eos: 14 %
Hematocrit: 38.2 % (ref 37.5–51.0)
Hemoglobin: 13.1 g/dL (ref 13.0–17.7)
Immature Grans (Abs): 0 10*3/uL (ref 0.0–0.1)
Immature Granulocytes: 0 %
Lymphocytes Absolute: 2.2 10*3/uL (ref 0.7–3.1)
Lymphs: 37 %
MCH: 30.7 pg (ref 26.6–33.0)
MCHC: 34.3 g/dL (ref 31.5–35.7)
MCV: 90 fL (ref 79–97)
Monocytes Absolute: 0.4 10*3/uL (ref 0.1–0.9)
Monocytes: 7 %
Neutrophils Absolute: 2.4 10*3/uL (ref 1.4–7.0)
Neutrophils: 41 %
Platelets: 179 10*3/uL (ref 150–450)
RBC: 4.27 x10E6/uL (ref 4.14–5.80)
RDW: 12.4 % (ref 11.6–15.4)
WBC: 5.9 10*3/uL (ref 3.4–10.8)

## 2021-07-13 LAB — PSA, TOTAL AND FREE
PSA, Free Pct: 15.8 %
PSA, Free: 1.53 ng/mL
Prostate Specific Ag, Serum: 9.7 ng/mL — ABNORMAL HIGH (ref 0.0–4.0)

## 2021-07-15 LAB — CULTURE, URINE COMPREHENSIVE

## 2021-07-19 ENCOUNTER — Other Ambulatory Visit: Payer: Self-pay | Admitting: Nurse Practitioner

## 2021-07-19 DIAGNOSIS — J84112 Idiopathic pulmonary fibrosis: Secondary | ICD-10-CM

## 2021-07-27 DIAGNOSIS — H905 Unspecified sensorineural hearing loss: Secondary | ICD-10-CM | POA: Diagnosis not present

## 2021-08-12 ENCOUNTER — Ambulatory Visit (INDEPENDENT_AMBULATORY_CARE_PROVIDER_SITE_OTHER): Payer: Medicare Other | Admitting: Urology

## 2021-08-12 ENCOUNTER — Encounter: Payer: Self-pay | Admitting: Urology

## 2021-08-12 ENCOUNTER — Other Ambulatory Visit: Payer: Self-pay | Admitting: Nurse Practitioner

## 2021-08-12 VITALS — BP 131/68 | HR 74 | Ht 69.0 in | Wt 162.0 lb

## 2021-08-12 DIAGNOSIS — N419 Inflammatory disease of prostate, unspecified: Secondary | ICD-10-CM

## 2021-08-12 DIAGNOSIS — N3944 Nocturnal enuresis: Secondary | ICD-10-CM | POA: Diagnosis not present

## 2021-08-12 DIAGNOSIS — R972 Elevated prostate specific antigen [PSA]: Secondary | ICD-10-CM | POA: Diagnosis not present

## 2021-08-12 LAB — URINALYSIS, ROUTINE W REFLEX MICROSCOPIC
Bilirubin, UA: NEGATIVE
Glucose, UA: NEGATIVE
Ketones, UA: NEGATIVE
Leukocytes,UA: NEGATIVE
Nitrite, UA: NEGATIVE
RBC, UA: NEGATIVE
Specific Gravity, UA: 1.02 (ref 1.005–1.030)
Urobilinogen, Ur: 1 mg/dL (ref 0.2–1.0)
pH, UA: 5.5 (ref 5.0–7.5)

## 2021-08-12 LAB — BLADDER SCAN AMB NON-IMAGING: Scan Result: 0

## 2021-08-12 MED ORDER — MIRABEGRON ER 50 MG PO TB24: 50.0000 mg | ORAL_TABLET | Freq: Every day | ORAL | 0 refills | Status: DC

## 2021-08-12 MED ORDER — SULFAMETHOXAZOLE-TRIMETHOPRIM 800-160 MG PO TABS: 1.0000 | ORAL_TABLET | Freq: Two times a day (BID) | ORAL | 0 refills | Status: AC

## 2021-08-12 NOTE — Progress Notes (Signed)
Assessment: 1. Nocturnal enuresis   2. Elevated PSA   3. Prostatitis, unspecified prostatitis type      Plan: Recommend increasing daytime fluid intake Patient reassured that he is emptying his bladder well. Bactrim DS BID x 14 days Increase Myrbetriq to 50 mg nightly. Samples given. Return to office in 1 month   Chief Complaint:  Chief Complaint  Patient presents with   Urinary Incontinence    History of Present Illness:  [name] is a 87 y.o. year old male who is seen for continued evaluation of elevated PSA and urinary incontinence.  At his visit in 11/22, he reported nighttime incontinence which was gradually worsening.  He was voiding 1-2 times per day.  No significant fluid intake throughout the day.  He was wearing adult diapers and soaking these at night.  No dysuria or gross hematuria.  He did not feel like he emptied his bladder completely.  He was on Myrbetriq 25 mg.   He is nonambulatory due to significant lower extremity weakness.  He voids into a urinal. PVR = 3 mL. DRE demonstrated a 40 g prostate without tenderness or nodularity.  PSA from 10/26/2020 was 7.4.  PSA from 07/13/2021 was 9.7 with 15.8% free  He returns today for further evaluation.  He feels like he is having difficulty emptying his bladder during the day.  He voids twice during the day.  He continues to have incontinence at night.  He is using adult diapers.  He continues on Myrbetriq 25 mg daily.  He is only drinking slightly more than 10 ounces per day.  No dysuria or gross hematuria.  Portions of the above documentation were copied from a prior visit for review purposes only.   Past Medical History:  Past Medical History:  Diagnosis Date   Abnormality of gait 08/01/2013   Arthritis    Blindness of left eye    Posttraumatic   Carotid artery disease (HCC)    Colon polyp    Constipation    Diaphragmatic hernia    Disc disease, degenerative, cervical    Diverticulitis    ED (erectile  dysfunction)    Gastroparesis    GERD (gastroesophageal reflux disease)    Glaucoma    Heart disease    Hyperlipidemia    Hypertension    Insomnia    Memory difficulty 08/04/2014   Nicotine dependence    Numbness and tingling of right arm 08/04/2014   Osteoarthritis    Overactive bladder    Radiculopathy    Shortness of breath    with activity   Sleep apnea    does not use CPAP.  Tested maybe 5- 10 years ago   Stroke [facility])    Left side weakness.   Unilateral inguinal hernia     Past Surgical History:  Past Surgical History:  Procedure Laterality Date   ANTERIOR CERVICAL DECOMP/DISCECTOMY FUSION N/A 07/01/2012   Procedure: ANTERIOR CERVICAL DECOMPRESSION/DISCECTOMY FUSION CERVICAL FIVE-SIX [name] REMOVAL CERVICAL SIX-SEVEN;  Surgeon: [name], MD;  Location: [place] [facility];  Service: Neurosurgery;  Laterality: N/A;   CERVICAL FUSION     COLONOSCOPY   08/04/2002     RMR: Normal rectum/Pancolonic diverticula/Colonic polyps as described above, biopsied and/or snared   COLONOSCOPY  60/08/2009   RMR: normal rectum/left and right sided diverticula/multiple colonic polyps. tubular adenomas. surveillance due 2014   COLONOSCOPY WITH ESOPHAGOGASTRODUODENOSCOPY (EGD) N/A 12/18/2012   Dr. [name]:  Colonic diverticulosis. Multiple colonic polyps-hyperplastic polyps and tubular adenoma. Friable anal canal  hemorrhoids. EGD with chronic atrophic gastritis   ESOPHAGOGASTRODUODENOSCOPY  04/14/2002   QDI:YMEBRAXE'N' ring, status post dilation as described above/Hiatal hernia, focal antral erosions of uncertain clinical significance   ESOPHAGOGASTRODUODENOSCOPY  06/09/2009   RMR: normal esophagus s/p dilator/small hiatal hernia otherwise normal   HERNIA REPAIR Right    inguinal   I & D EXTREMITY Right 04/23/2017   Procedure: [place] HAND;  Surgeon: [name], MD;  Location: [place];  Service: Orthopedics;  Laterality: Right;   INGUINAL HERNIA REPAIR Left 04/15/2014    Procedure: LEFT INGUINAL HERNIORRHAPHY WITH MESH;  Surgeon: [name], MD;  Location: [facility];  Service: General;  Laterality: Left;   INSERTION OF MESH Left 04/15/2014   Procedure: INSERTION OF MESH;  Surgeon: [name], MD;  Location[facility];  Service: General;  Laterality: Left;    Allergies:  No Known Allergies  Family History:  Family History  Problem Relation Age of Onset   Cancer Mother    Dementia Father    Colon cancer Neg Hx     Social History:  Social History   Tobacco Use   Smoking status: Every Day    Packs/day: 0.25    Years: 60.00    Total pack years: 15.00    Types: Cigarettes   Smokeless tobacco: Never   Tobacco comments:    using patch  Vaping Use   Vaping Use: Never used  Substance Use Topics   Alcohol use: No   Drug use: No    ROS: Constitutional:  Negative for fever, chills, weight loss CV: Negative for chest pain, previous MI, hypertension Respiratory:  Negative for shortness of breath, wheezing, sleep apnea, frequent cough GI:  Negative for nausea, vomiting, bloody stool, GERD  Physical exam: BP 131/68   Pulse 74   Ht '5\' 9"'$  (1.753 m)   Wt 162 lb (73.5 kg)   BMI 23.92 kg/m  GENERAL APPEARANCE:  Well appearing, well developed, well nourished, NAD HEENT:  Atraumatic, normocephalic, oropharynx clear NECK:  Supple without lymphadenopathy or thyromegaly ABDOMEN:  Soft, non-tender, no masses EXTREMITIES: Without clubbing, cyanosis, or edema NEUROLOGIC:  Alert and oriented x 3, in wheelchair, CN II-XII grossly intact MENTAL STATUS:  appropriate BACK:  Non-tender to palpation, No CVAT SKIN:  Warm, dry, and intact GU: Prostate: 40 g, tenderness to palpation Rectum: Normal tone,  no masses or tenderness   Results: U/A: Dipstick negative  PVR: 0 mL

## 2021-08-12 NOTE — Progress Notes (Signed)
post void residual = 0 ml

## 2021-08-20 ENCOUNTER — Emergency Department (HOSPITAL_COMMUNITY): Payer: Medicare Other

## 2021-08-20 ENCOUNTER — Emergency Department (HOSPITAL_COMMUNITY)
Admission: EM | Admit: 2021-08-20 | Discharge: 2021-08-20 | Disposition: A | Discharge status: Home / Self Care | Payer: Medicare Other | Attending: Emergency Medicine | Admitting: Emergency Medicine

## 2021-08-20 ENCOUNTER — Other Ambulatory Visit: Payer: Self-pay

## 2021-08-20 ENCOUNTER — Encounter (HOSPITAL_COMMUNITY): Payer: Self-pay

## 2021-08-20 DIAGNOSIS — N419 Inflammatory disease of prostate, unspecified: Secondary | ICD-10-CM | POA: Insufficient documentation

## 2021-08-20 DIAGNOSIS — Z7982 Long term (current) use of aspirin: Secondary | ICD-10-CM | POA: Insufficient documentation

## 2021-08-20 DIAGNOSIS — W19XXXA Unspecified fall, initial encounter: Secondary | ICD-10-CM

## 2021-08-20 DIAGNOSIS — W182XXA Fall in (into) shower or empty bathtub, initial encounter: Secondary | ICD-10-CM | POA: Diagnosis not present

## 2021-08-20 DIAGNOSIS — F0390 Unspecified dementia without behavioral disturbance: Secondary | ICD-10-CM | POA: Insufficient documentation

## 2021-08-20 DIAGNOSIS — Z8709 Personal history of other diseases of the respiratory system: Secondary | ICD-10-CM | POA: Diagnosis not present

## 2021-08-20 DIAGNOSIS — E860 Dehydration: Secondary | ICD-10-CM | POA: Insufficient documentation

## 2021-08-20 DIAGNOSIS — R531 Weakness: Secondary | ICD-10-CM

## 2021-08-20 DIAGNOSIS — Z043 Encounter for examination and observation following other accident: Secondary | ICD-10-CM | POA: Diagnosis not present

## 2021-08-20 DIAGNOSIS — R9431 Abnormal electrocardiogram [ECG] [EKG]: Secondary | ICD-10-CM | POA: Insufficient documentation

## 2021-08-20 DIAGNOSIS — Y92002 Bathroom of unspecified non-institutional (private) residence single-family (private) house as the place of occurrence of the external cause: Secondary | ICD-10-CM | POA: Insufficient documentation

## 2021-08-20 DIAGNOSIS — R6889 Other general symptoms and signs: Secondary | ICD-10-CM | POA: Diagnosis not present

## 2021-08-20 DIAGNOSIS — I6782 Cerebral ischemia: Secondary | ICD-10-CM | POA: Insufficient documentation

## 2021-08-20 DIAGNOSIS — G9389 Other specified disorders of brain: Secondary | ICD-10-CM | POA: Diagnosis not present

## 2021-08-20 DIAGNOSIS — S0990XA Unspecified injury of head, initial encounter: Secondary | ICD-10-CM | POA: Diagnosis not present

## 2021-08-20 DIAGNOSIS — R0902 Hypoxemia: Secondary | ICD-10-CM | POA: Diagnosis not present

## 2021-08-20 DIAGNOSIS — Z743 Need for continuous supervision: Secondary | ICD-10-CM | POA: Diagnosis not present

## 2021-08-20 LAB — CBC WITH DIFFERENTIAL/PLATELET
Abs Immature Granulocytes: 0.04 10*3/uL (ref 0.00–0.07)
Basophils Absolute: 0 10*3/uL (ref 0.0–0.1)
Basophils Relative: 0 %
Eosinophils Absolute: 1.3 10*3/uL — ABNORMAL HIGH (ref 0.0–0.5)
Eosinophils Relative: 17 %
HCT: 41.7 % (ref 39.0–52.0)
Hemoglobin: 13.4 g/dL (ref 13.0–17.0)
Immature Granulocytes: 1 %
Lymphocytes Relative: 18 %
Lymphs Abs: 1.3 10*3/uL (ref 0.7–4.0)
MCH: 30.9 pg (ref 26.0–34.0)
MCHC: 32.1 g/dL (ref 30.0–36.0)
MCV: 96.3 fL (ref 80.0–100.0)
Monocytes Absolute: 0.7 10*3/uL (ref 0.1–1.0)
Monocytes Relative: 9 %
Neutro Abs: 4.3 10*3/uL (ref 1.7–7.7)
Neutrophils Relative %: 55 %
Platelets: 157 10*3/uL (ref 150–400)
RBC: 4.33 MIL/uL (ref 4.22–5.81)
RDW: 14.3 % (ref 11.5–15.5)
WBC: 7.6 10*3/uL (ref 4.0–10.5)
nRBC: 0 % (ref 0.0–0.2)

## 2021-08-20 LAB — BRAIN NATRIURETIC PEPTIDE: B Natriuretic Peptide: 27 pg/mL (ref 0.0–100.0)

## 2021-08-20 LAB — COMPREHENSIVE METABOLIC PANEL
ALT: 13 U/L (ref 0–44)
AST: 28 U/L (ref 15–41)
Albumin: 3.7 g/dL (ref 3.5–5.0)
Alkaline Phosphatase: 74 U/L (ref 38–126)
Anion gap: 9 (ref 5–15)
BUN: 18 mg/dL (ref 8–23)
CO2: 20 mmol/L — ABNORMAL LOW (ref 22–32)
Calcium: 8.6 mg/dL — ABNORMAL LOW (ref 8.9–10.3)
Chloride: 106 mmol/L (ref 98–111)
Creatinine, Ser: 1.47 mg/dL — ABNORMAL HIGH (ref 0.61–1.24)
GFR, Estimated: 46 mL/min — ABNORMAL LOW (ref >60)
Glucose, Bld: 101 mg/dL — ABNORMAL HIGH (ref 70–99)
Potassium: 4.2 mmol/L (ref 3.5–5.1)
Sodium: 135 mmol/L (ref 135–145)
Total Bilirubin: 0.7 mg/dL (ref 0.3–1.2)
Total Protein: 7.6 g/dL (ref 6.5–8.1)

## 2021-08-20 LAB — URINALYSIS, ROUTINE W REFLEX MICROSCOPIC
Bacteria, UA: NONE SEEN
Bilirubin Urine: NEGATIVE
Glucose, UA: NEGATIVE mg/dL
Ketones, ur: 5 mg/dL — AB
Leukocytes,Ua: NEGATIVE
Nitrite: NEGATIVE
Protein, ur: 30 mg/dL — AB
Specific Gravity, Urine: 1.019 (ref 1.005–1.030)
pH: 6 (ref 5.0–8.0)

## 2021-08-20 LAB — LACTIC ACID, PLASMA
Lactic Acid, Venous: 2 mmol/L (ref 0.5–1.9)
Lactic Acid, Venous: 0.8 mmol/L (ref 0.5–1.9)

## 2021-08-20 MED ORDER — SODIUM CHLORIDE 0.9 % IV BOLUS
1000.0000 mL | Freq: Once | INTRAVENOUS | Status: AC
  Administered 2021-08-20: 1000 mL via INTRAVENOUS

## 2021-08-20 NOTE — ED Triage Notes (Signed)
Pt fell in bathroom this am, denies LOC, denies hitting head. Denies blood thinners. Family wants pt checked out. Pt denies pain. Recently began new medication for prostate. Hx of dementia

## 2021-08-20 NOTE — ED Notes (Signed)
Patient transported to X-ray 

## 2021-08-20 NOTE — ED Provider Notes (Signed)
[facility] EMERGENCY DEPARTMENT Provider Note   CSN: [id number] Arrival date & time: 08/20/21  1103     History  Chief Complaint  Patient presents with   [name]    [name] is a 87 y.o. male.  HPI Patient with multiple medical issues including dementia presents from EMS after a witnessed fall.  Patient seemingly started antibiotics a few days ago for prostatitis.  Otherwise no recent medication change, activity change according to EMS.  Patient denies focal pain, acknowledges generalized weakness, denies focality, but his ability to provide details in depth is questionable.  Level 5 caveat secondary to dementia.  Family reportedly was concerned the patient was weaker rather than improving over the past 3 days in spite of antibiotics.  Today's fall was witnessed, without reported head trauma, about report of a deformity on the scene.    Home Medications Prior to Admission medications   Medication Sig Start Date End Date Taking? Authorizing Provider  albuterol (VENTOLIN HFA) 108 (90 Base) MCG/ACT inhaler Inhale 2 puffs into the lungs every 4 (four) hours as needed for wheezing or shortness of breath. 11/03/20  Yes Ivy Lynn, NP  amLODipine (NORVASC) 5 MG tablet TAKE (1) TABLET BY MOUTH AT BEDTIME. Patient taking differently: Take 5 mg by mouth at bedtime. 06/15/21  Yes Ivy Lynn, NP  aspirin EC 325 MG tablet TAKE 1 TABLET BY MOUTH ONCE A DAY. 06/15/21  Yes Ivy Lynn, NP  atorvastatin (LIPITOR) 40 MG tablet TAKE (1) TABLET BY MOUTH AT BEDTIME. 02/17/21  Yes Ijaola, Quinn Axe, NP  fluticasone (FLONASE) 50 MCG/ACT nasal spray USE 1 OR 2 SPRAYS IN EACH NOSTRIL EVERY DAY FOR NASAL ALLERGIES. Patient taking differently: Place 2 sprays into both nostrils daily. 11/08/18  Yes Bergen, Modena Nunnery, MD  levETIRAcetam (KEPPRA) 250 MG tablet TAKE (1) TABLET BY MOUTH EACH MORNING. Patient taking differently: Take 250 mg by mouth daily. 06/15/21  Yes Ivy Lynn, NP  lisinopril  (ZESTRIL) 10 MG tablet TAKE 1 TABLET BY MOUTH ONCE A DAY FOR HIGH BLOOD PRESSURE. TAKE W/20MG TABLET TO EQUAL 30MG. Patient taking differently: Take 10 mg by mouth daily. TAKE 1 TABLET BY MOUTH ONCE A DAY FOR HIGH BLOOD PRESSURE. TAKE W/20MG TABLET TO EQUAL 30MG. 06/15/21  Yes Ivy Lynn, NP  lisinopril (ZESTRIL) 20 MG tablet TAKE 1 TABLET BY MOUTH ONCE A DAY. TAKE WITH 10MG TABLET FOR TOTAL DOSE OF 30MG. Patient taking differently: Take 20 mg by mouth daily. TAKE 1 TABLET BY MOUTH ONCE A DAY. TAKE WITH 10MG TABLET FOR TOTAL DOSE OF 30MG. 06/15/21  Yes Ivy Lynn, NP  LORazepam (ATIVAN) 1 MG tablet Take 1 tablet (1 mg total) by mouth at bedtime. 05/11/21  Yes Ivy Lynn, NP  lubiprostone (AMITIZA) 24 MCG capsule TAKE 1 CAPSULE BY MOUTH DAILY Patient taking differently: Take 24 mcg by mouth daily. 06/15/21  Yes Ivy Lynn, NP  mirabegron ER (MYRBETRIQ) 50 MG TB24 tablet Take 1 tablet (50 mg total) by mouth daily. 08/12/21  Yes Stoneking, Reece Leader., MD  NITROSTAT 0.4 MG SL tablet PLACE 1 TAB UNDER TONGUE EVERY 5 MIN IF NEEDED FOR CHEST PAIN. MAY USE 3 TIMES.NO RELIEF CALL 911. Patient taking differently: Place 0.4 mg under the tongue every 5 (five) minutes as needed for chest pain. 12/18/17  Yes Othello, Modena Nunnery, MD  omeprazole (PRILOSEC) 20 MG capsule TAKE 1 CAPSULE BY MOUTH ONCE DAILY FOR RELFUX, ESOPHAGITIS, OR STOMACH ULCERS. Patient taking differently: Take  20 mg by mouth daily. 06/15/21  Yes Ivy Lynn, NP  PARoxetine (PAXIL) 40 MG tablet TAKE 1 TABLET BY MOUTH ONCE A DAY. Patient taking differently: Take 40 mg by mouth in the morning. 06/15/21  Yes Ivy Lynn, NP  sulfamethoxazole-trimethoprim (BACTRIM DS) 800-160 MG tablet Take 1 tablet by mouth every 12 (twelve) hours for 14 days. 08/12/21 08/26/21 Yes Stoneking, Reece Leader., MD  thiamine (VITAMIN B-1) 100 MG tablet Take 1 tablet (100 mg total) by mouth daily. 07/29/20  Yes Ivy Lynn, NP  traZODone (DESYREL)  100 MG tablet TAKE (1) TABLET BY MOUTH AT BEDTIME AS NEEDED FOR SLEEP Patient taking differently: Take 100 mg by mouth at bedtime as needed for sleep. 06/15/21  Yes Ivy Lynn, NP  vitamin B-12 (CYANOCOBALAMIN) 1000 MCG tablet Take one tablet by mouth once daily 02/17/21  Yes Ivy Lynn, NP  Respiratory Therapy Supplies (NEBULIZER/TUBING/MOUTHPIECE) KIT Disp one nebulizer machine, tubing set and mouthpiece kit 08/17/17   Delsa Grana, PA-C      Allergies    Patient has no known allergies.    Review of Systems   Review of Systems  Unable to perform ROS: Dementia    Physical Exam Updated Vital Signs BP 115/63   Pulse 83   Temp 98.4 F (36.9 C) (Oral)   Resp 17   Ht 5' 9"  (1.753 m)   Wt 73.5 kg   SpO2 94%   BMI 23.93 kg/m  Physical Exam Vitals and nursing note reviewed.  Constitutional:      General: He is not in acute distress.    Appearance: He is well-developed.  HENT:     Head: Normocephalic and atraumatic.  Eyes:     Conjunctiva/sclera: Conjunctivae normal.  Cardiovascular:     Rate and Rhythm: Normal rate and regular rhythm.  Pulmonary:     Effort: Pulmonary effort is normal. No respiratory distress.     Breath sounds: No stridor.  Abdominal:     General: There is no distension.  Musculoskeletal:     Comments: Pelvis stable, legs equal length, patient flexes each hip 4/5 strength to command.  Knees unremarkable, ankles unremarkable.  Skin:    General: Skin is warm and dry.  Neurological:     Mental Status: He is alert.     Motor: Atrophy present.     Comments: Patient does move all extremities to command, though slowly.  Psychiatric:        Cognition and Memory: Cognition is impaired. Memory is impaired.     ED Results / Procedures / Treatments   Labs (all labs ordered are listed, but only abnormal results are displayed) Labs Reviewed  COMPREHENSIVE METABOLIC PANEL - Abnormal; Notable for the following components:      Result Value   CO2 20 (*)     Glucose, Bld 101 (*)    Creatinine, Ser 1.47 (*)    Calcium 8.6 (*)    GFR, Estimated 46 (*)    All other components within normal limits  LACTIC ACID, PLASMA - Abnormal; Notable for the following components:   Lactic Acid, Venous 2.0 (*)    All other components within normal limits  CBC WITH DIFFERENTIAL/PLATELET - Abnormal; Notable for the following components:   Eosinophils Absolute 1.3 (*)    All other components within normal limits  URINALYSIS, ROUTINE W REFLEX MICROSCOPIC - Abnormal; Notable for the following components:   APPearance HAZY (*)    Hgb urine dipstick MODERATE (*)  Ketones, ur 5 (*)    Protein, ur 30 (*)    All other components within normal limits  URINE CULTURE  LACTIC ACID, PLASMA  BRAIN NATRIURETIC PEPTIDE    EKG EKG Interpretation  Date/Time:  Saturday August 20 2021 11:16:12 EDT Ventricular Rate:  86 PR Interval:  262 QRS Duration: 84 QT Interval:  354 QTC Calculation: 424 R Axis:   76 Text Interpretation: Sinus rhythm Prolonged PR interval Baseline wander in lead(s) V1 Artifact Abnormal ECG Confirmed by Carmin Muskrat [id number] on 08/20/2021 12:14:24 PM  Radiology CT Head Wo Contrast  Result Date: 08/20/2021 CLINICAL DATA:  Polytrauma, blunt, dementia. Patient fell in the bathroom this morning. EXAM: CT HEAD WITHOUT CONTRAST TECHNIQUE: Contiguous axial images were obtained from the base of the skull through the vertex without intravenous contrast. RADIATION DOSE REDUCTION: This exam was performed according to the departmental dose-optimization program which includes automated exposure control, adjustment of the mA and/or kV according to patient size and/or use of iterative reconstruction technique. COMPARISON:  MRI examination dated March 05, 2017. FINDINGS: Brain: No evidence of acute infarction, hemorrhage, hydrocephalus, extra-axial collection or mass lesion/mass effect. Encephalomalacia of the left frontal lobe suggesting prior infarct. There is  also encephalomalacia of the bilateral cerebellum right greater than the left. Moderate cerebral volume loss and advanced chronic microvascular ischemic changes of the white matter. Vascular: No hyperdense vessel or unexpected calcification. Skull: Normal. Negative for fracture or focal lesion. Sinuses/Orbits: No acute finding. Other: None. IMPRESSION: 1. No acute intracranial abnormality. 2. Bilateral cerebellar and left frontal encephalomalacia suggesting prior infarcts. Cerebral atrophy and chronic microvascular ischemic changes of the white matter, unchanged. Electronically Signed   By: Keane Police D.O.   On: 08/20/2021 12:23   DG Pelvis 1-2 Views  Result Date: 08/20/2021 CLINICAL DATA:  Weakness, fall.  Fell in the bathroom this morning. EXAM: PELVIS - 1-2 VIEW COMPARISON:  None Available. FINDINGS: There is no evidence of pelvic fracture or diastasis. No pelvic bone lesions are seen. Bilateral hip joint space narrowing with marginal spurring. Degenerate disc disease of the lower lumbar spine. IMPRESSION: No acute fracture or dislocation. Electronically Signed   By: Keane Police D.O.   On: 08/20/2021 12:14   DG Chest 2 View  Result Date: 08/20/2021 CLINICAL DATA:  Patient fell in the bathroom. EXAM: CHEST - 2 VIEW COMPARISON:  08/16/2017 FINDINGS: Low volume film. Cardiopericardial silhouette is at upper limits of normal for size. Mild interval progression of bibasilar collapse/consolidation, left greater than right superimposed on background diffuse interstitial coarsening. Calcified nodal tissue in the left hilum again noted. The visualized bony structures of the thorax are unremarkable. Telemetry leads overlie the chest. IMPRESSION: No substantial interval change in exam. Mild increase in bibasilar collapse/consolidation, left greater than right. Electronically Signed   By: Misty Stanley M.D.   On: 08/20/2021 12:13    Procedures Procedures    Medications Ordered in ED Medications  sodium  chloride 0.9 % bolus 1,000 mL (0 mLs Intravenous Stopped 08/20/21 1356)    ED Course/ Medical Decision Making/ A&P This patient with a Hx of total medical issues including dementia, recent initiation of antibiotics for prostatitis presents to the ED for concern of fall, weakness, this involves an extensive number of treatment options, and is a complaint that carries with it a high risk of complications and morbidity.    The differential diagnosis includes hydration, bacteremia, sepsis, and posttraumatic effects including intracranial abnormality, pelvic fracture   Social Determinants of Health:  Age,  dementia  Additional history obtained:  Additional history and/or information obtained from EMS, notable for details above   After the initial evaluation, orders, including: Labs x-ray CT were initiated.   Patient placed on Cardiac and Pulse-Oximetry Monitors. The patient was maintained on a cardiac monitor.  The cardiac monitored showed an rhythm of 80 sinus normal The patient was also maintained on pulse oximetry. The readings were typically 100 room air normal   On repeat evaluation of the patient stayed the same  Update: Patient joined by his sister the caregiver, and he needs.  They note that the patient has been weaker, as above, and the sister was present during the fall which she notes was in the bathroom, essentially eating stuck between the commode and the wall.  Patient notes that he is in no pain  3:05 PM Patient smiling.  We discussed all findings, urinalysis consistent with earlier suspicion for dehydration, no evidence for infection.  Patient's blood pressure has continued to improve, he has no ongoing complaints, and after conversation about all findings, patient discharged to follow-up with primary care.  Lab Tests:  I personally interpreted labs.  The pertinent results include: Creatinine elevation suggesting dehydration contributing to his weakness.  Imaging Studies  ordered:  I independently visualized and interpreted imaging which showed no intracranial hemorrhage, no fractures on x-ray, generally reassuring I agree with the radiologist interpretation   Dispostion / Final MDM:  After consideration of the diagnostic results and the patient's response to treatment, this adult male with multiple medical issues most notably dementia and recent initiation of medication for prostatitis presents after a fall with concern for ongoing weakness.  Here patient is awake, alert, moving all extremities spontaneously has no focal neurologic deficits, has demonstration of his known dementia, but otherwise generally reassuring physical exam.  Labs, imaging as above, reviewed, patient had resuscitation with fluids, improved numerically and clinically, was discharged in stable condition.  Final Clinical Impression(s) / ED Diagnoses Final diagnoses:  Fall, initial encounter  Weakness  Dehydration    Rx / DC Orders ED Discharge Orders     None         Carmin Muskrat, MD 08/20/21 1505

## 2021-08-20 NOTE — Discharge Instructions (Signed)
As discussed, your evaluation today has been largely reassuring.  But, it is important that you monitor your condition carefully, and do not hesitate to return to the ED if you develop new, or concerning changes in your condition. ? ?Otherwise, please follow-up with your physician for appropriate ongoing care. ? ?

## 2021-08-22 LAB — URINE CULTURE

## 2021-08-23 ENCOUNTER — Telehealth: Payer: Self-pay

## 2021-08-23 ENCOUNTER — Other Ambulatory Visit: Payer: Self-pay | Admitting: Nurse Practitioner

## 2021-08-23 DIAGNOSIS — F5104 Psychophysiologic insomnia: Secondary | ICD-10-CM

## 2021-08-23 NOTE — Telephone Encounter (Signed)
Spoke to [place] and notified her - advised her to have Arbie Cookey call the office in the AM and ask for me

## 2021-08-23 NOTE — Telephone Encounter (Signed)
Please have patients care giver to hold 20 mg tablet for now until BP improves. Continue lisinopril 10 mg tablet by mouth daily, and check blood pressures and call back in 24 hours, keep patient hydrated.

## 2021-08-23 NOTE — Telephone Encounter (Signed)
Home health nurse is with patient this afternoon.  Blood pressure is reading at 95/50 and patient has been experiencing weakness and dizziness.  Patient checked blood pressure on Saturday and it was 105/63 before taking any medication.  Patient is currently on Amlodipine 5 mg, Lisinopril 10 mg and 20 mg dose.  Please advise.

## 2021-08-24 ENCOUNTER — Encounter: Payer: Self-pay | Admitting: Nurse Practitioner

## 2021-08-24 ENCOUNTER — Encounter (HOSPITAL_COMMUNITY): Payer: Self-pay

## 2021-08-24 ENCOUNTER — Ambulatory Visit (INDEPENDENT_AMBULATORY_CARE_PROVIDER_SITE_OTHER): Payer: Medicare Other | Admitting: Nurse Practitioner

## 2021-08-24 ENCOUNTER — Other Ambulatory Visit: Payer: Self-pay | Admitting: Urology

## 2021-08-24 ENCOUNTER — Emergency Department (HOSPITAL_COMMUNITY): Payer: Medicare Other

## 2021-08-24 ENCOUNTER — Other Ambulatory Visit: Payer: Self-pay

## 2021-08-24 ENCOUNTER — Other Ambulatory Visit: Payer: Self-pay | Admitting: Nurse Practitioner

## 2021-08-24 ENCOUNTER — Observation Stay (HOSPITAL_COMMUNITY)
Admission: EM | Admit: 2021-08-24 | Discharge: 2021-08-25 | Disposition: A | Discharge status: Home / Self Care | Payer: Medicare Other | Attending: Internal Medicine | Admitting: Internal Medicine

## 2021-08-24 VITALS — BP 86/56 | HR 86 | Ht 69.0 in

## 2021-08-24 DIAGNOSIS — J9601 Acute respiratory failure with hypoxia: Secondary | ICD-10-CM | POA: Diagnosis not present

## 2021-08-24 DIAGNOSIS — J84112 Idiopathic pulmonary fibrosis: Secondary | ICD-10-CM | POA: Diagnosis not present

## 2021-08-24 DIAGNOSIS — Z79899 Other long term (current) drug therapy: Secondary | ICD-10-CM | POA: Insufficient documentation

## 2021-08-24 DIAGNOSIS — E861 Hypovolemia: Secondary | ICD-10-CM | POA: Diagnosis not present

## 2021-08-24 DIAGNOSIS — R531 Weakness: Secondary | ICD-10-CM | POA: Diagnosis not present

## 2021-08-24 DIAGNOSIS — Z7982 Long term (current) use of aspirin: Secondary | ICD-10-CM | POA: Insufficient documentation

## 2021-08-24 DIAGNOSIS — F0390 Unspecified dementia without behavioral disturbance: Secondary | ICD-10-CM | POA: Insufficient documentation

## 2021-08-24 DIAGNOSIS — R569 Unspecified convulsions: Secondary | ICD-10-CM | POA: Insufficient documentation

## 2021-08-24 DIAGNOSIS — I959 Hypotension, unspecified: Secondary | ICD-10-CM | POA: Diagnosis not present

## 2021-08-24 DIAGNOSIS — J189 Pneumonia, unspecified organism: Secondary | ICD-10-CM | POA: Insufficient documentation

## 2021-08-24 DIAGNOSIS — N179 Acute kidney failure, unspecified: Secondary | ICD-10-CM | POA: Insufficient documentation

## 2021-08-24 DIAGNOSIS — I2510 Atherosclerotic heart disease of native coronary artery without angina pectoris: Secondary | ICD-10-CM | POA: Diagnosis not present

## 2021-08-24 DIAGNOSIS — Z8673 Personal history of transient ischemic attack (TIA), and cerebral infarction without residual deficits: Secondary | ICD-10-CM | POA: Diagnosis not present

## 2021-08-24 DIAGNOSIS — Z20822 Contact with and (suspected) exposure to covid-19: Secondary | ICD-10-CM | POA: Insufficient documentation

## 2021-08-24 DIAGNOSIS — F17210 Nicotine dependence, cigarettes, uncomplicated: Secondary | ICD-10-CM | POA: Diagnosis not present

## 2021-08-24 DIAGNOSIS — I10 Essential (primary) hypertension: Secondary | ICD-10-CM | POA: Insufficient documentation

## 2021-08-24 DIAGNOSIS — F321 Major depressive disorder, single episode, moderate: Secondary | ICD-10-CM | POA: Diagnosis present

## 2021-08-24 DIAGNOSIS — R0602 Shortness of breath: Secondary | ICD-10-CM | POA: Diagnosis not present

## 2021-08-24 DIAGNOSIS — G4733 Obstructive sleep apnea (adult) (pediatric): Secondary | ICD-10-CM | POA: Diagnosis present

## 2021-08-24 DIAGNOSIS — R69 Illness, unspecified: Secondary | ICD-10-CM | POA: Diagnosis not present

## 2021-08-24 DIAGNOSIS — I9589 Other hypotension: Secondary | ICD-10-CM

## 2021-08-24 DIAGNOSIS — G9341 Metabolic encephalopathy: Secondary | ICD-10-CM | POA: Insufficient documentation

## 2021-08-24 DIAGNOSIS — E860 Dehydration: Secondary | ICD-10-CM | POA: Insufficient documentation

## 2021-08-24 DIAGNOSIS — Z993 Dependence on wheelchair: Secondary | ICD-10-CM

## 2021-08-24 DIAGNOSIS — R5381 Other malaise: Secondary | ICD-10-CM | POA: Diagnosis not present

## 2021-08-24 LAB — COMPREHENSIVE METABOLIC PANEL
ALT: 17 U/L (ref 0–44)
AST: 39 U/L (ref 15–41)
Albumin: 3.1 g/dL — ABNORMAL LOW (ref 3.5–5.0)
Alkaline Phosphatase: 63 U/L (ref 38–126)
Anion gap: 5 (ref 5–15)
BUN: 28 mg/dL — ABNORMAL HIGH (ref 8–23)
CO2: 23 mmol/L (ref 22–32)
Calcium: 8.7 mg/dL — ABNORMAL LOW (ref 8.9–10.3)
Chloride: 109 mmol/L (ref 98–111)
Creatinine, Ser: 1.49 mg/dL — ABNORMAL HIGH (ref 0.61–1.24)
GFR, Estimated: 46 mL/min — ABNORMAL LOW (ref >60)
Glucose, Bld: 101 mg/dL — ABNORMAL HIGH (ref 70–99)
Potassium: 4.3 mmol/L (ref 3.5–5.1)
Sodium: 137 mmol/L (ref 135–145)
Total Bilirubin: 0.7 mg/dL (ref 0.3–1.2)
Total Protein: 7.5 g/dL (ref 6.5–8.1)

## 2021-08-24 LAB — CBC
HCT: 38.4 % — ABNORMAL LOW (ref 39.0–52.0)
Hemoglobin: 12.3 g/dL — ABNORMAL LOW (ref 13.0–17.0)
MCH: 30.1 pg (ref 26.0–34.0)
MCHC: 32 g/dL (ref 30.0–36.0)
MCV: 94.1 fL (ref 80.0–100.0)
Platelets: 164 10*3/uL (ref 150–400)
RBC: 4.08 MIL/uL — ABNORMAL LOW (ref 4.22–5.81)
RDW: 14.4 % (ref 11.5–15.5)
WBC: 6.8 10*3/uL (ref 4.0–10.5)
nRBC: 0 % (ref 0.0–0.2)

## 2021-08-24 LAB — URINALYSIS, ROUTINE W REFLEX MICROSCOPIC
Bacteria, UA: NONE SEEN
Bilirubin Urine: NEGATIVE
Glucose, UA: NEGATIVE mg/dL
Hgb urine dipstick: NEGATIVE
Ketones, ur: NEGATIVE mg/dL
Leukocytes,Ua: NEGATIVE
Nitrite: NEGATIVE
Protein, ur: 30 mg/dL — AB
Specific Gravity, Urine: 1.02 (ref 1.005–1.030)
pH: 6 (ref 5.0–8.0)

## 2021-08-24 LAB — BRAIN NATRIURETIC PEPTIDE: B Natriuretic Peptide: 16 pg/mL (ref 0.0–100.0)

## 2021-08-24 LAB — SARS CORONAVIRUS 2 BY RT PCR: SARS Coronavirus 2 by RT PCR: NEGATIVE

## 2021-08-24 LAB — LACTIC ACID, PLASMA: Lactic Acid, Venous: 0.9 mmol/L (ref 0.5–1.9)

## 2021-08-24 MED ORDER — ACETAMINOPHEN 650 MG RE SUPP: 650.0000 mg | Freq: Four times a day (QID) | RECTAL | Status: DC | PRN

## 2021-08-24 MED ORDER — SODIUM CHLORIDE 0.9 % IV SOLN: INTRAVENOUS | Status: DC

## 2021-08-24 MED ORDER — SODIUM CHLORIDE 0.9 % IV SOLN
1.0000 g | INTRAVENOUS | Status: DC
  Administered 2021-08-24: 1 g via INTRAVENOUS
  Filled 2021-08-24: qty 10

## 2021-08-24 MED ORDER — ONDANSETRON HCL 4 MG/2ML IJ SOLN: 4.0000 mg | Freq: Four times a day (QID) | INTRAMUSCULAR | Status: DC | PRN

## 2021-08-24 MED ORDER — ONDANSETRON HCL 4 MG PO TABS: 4.0000 mg | ORAL_TABLET | Freq: Four times a day (QID) | ORAL | Status: DC | PRN

## 2021-08-24 MED ORDER — ACETAMINOPHEN 325 MG PO TABS: 650.0000 mg | ORAL_TABLET | Freq: Four times a day (QID) | ORAL | Status: DC | PRN

## 2021-08-24 MED ORDER — LEVETIRACETAM 250 MG PO TABS
250.0000 mg | ORAL_TABLET | Freq: Every morning | ORAL | Status: DC
  Administered 2021-08-25: 250 mg via ORAL
  Filled 2021-08-24: qty 1

## 2021-08-24 MED ORDER — POLYETHYLENE GLYCOL 3350 17 G PO PACK: 17.0000 g | PACK | Freq: Every day | ORAL | Status: DC | PRN

## 2021-08-24 MED ORDER — HEPARIN SODIUM (PORCINE) 5000 UNIT/ML IJ SOLN
5000.0000 [IU] | Freq: Three times a day (TID) | INTRAMUSCULAR | Status: DC
Start: 2021-08-24 — End: 2021-08-25
  Administered 2021-08-25: 5000 [IU] via SUBCUTANEOUS
  Filled 2021-08-24: qty 1

## 2021-08-24 MED ORDER — SODIUM CHLORIDE 0.9 % IV BOLUS
1000.0000 mL | Freq: Once | INTRAVENOUS | Status: AC
  Administered 2021-08-24: 1000 mL via INTRAVENOUS

## 2021-08-24 NOTE — Assessment & Plan Note (Addendum)
O2 sats intermittently down to 87% on room air.  He denies difficulty breathing no chest pain no cough.  Two-view chest x-ray shows focal opacity of the right upper lobe which appears increased intensity when compared to prior exam, bilateral reticular opacities compatible with background fibrotic interstitial lung disease. -Hypoxia likely secondary to chronic lung disease -Initial hypotension that resolved prior to arrival in the ED, not tachycardic, no symptoms of PE, after held off on further imaging at this time to rule out PE due to AKI. -If further concerns may need to order CTA pending improvement in creatinine with hydration -Ceftriaxone was started for possible pneumonia, further antibiotics held for now

## 2021-08-24 NOTE — Progress Notes (Signed)
Established Patient Office Visit  Subjective   Patient ID: [name], male    DOB: 09-21-1934  Age: 87 y.o. MRN: [id number]  Chief Complaint  Patient presents with   Blood Pressure Check   Extremity Weakness   Eating Disorder    Extremity Weakness  This is a recurrent problem. The current episode started 1 to 4 weeks ago. There has been no history of extremity trauma. The problem occurs constantly. The patient is experiencing no pain. Pertinent negatives include no fever or itching. The symptoms are aggravated by activity. He has tried movement for the symptoms.  Care giver reports that patient has been declining in the past week, patient was started on Bactrim for UTI and started showing signs of generalize weakness. Patient has not had any food in 2 days and urine out put is diminishing. Care giver reports patient is not as responsive and has worsened fatigue and day time sleepiness. Blood pressure has been low for over 3 days even after discontinuing lisinopril 20 mg tablet daily for a total of 30 mg.  Patient Active Problem List   Diagnosis Date Noted   Anxiety 01/07/2021   Elevated PSA 10/28/2020   Urinary retention 10/25/2020   Abnormal urine odor 04/21/2020   Depression, recurrent ([place]) 04/21/2020   Cerebrovascular accident (CVA) due to embolism of left middle cerebral artery ([place]) 12/14/2018   Abnormal findings on diagnostic imaging of lung 08/19/2018   IPF (idiopathic pulmonary fibrosis) ([place]) 08/19/2018   OSA (obstructive sleep apnea) 08/19/2018   Carotid artery disease ([place]) 03/26/2017   Internal carotid artery stenosis, left 03/06/2017   Dysphagia 03/06/2017   Pulmonary nodules 03/06/2017   Seizure ([place]) 03/03/2017   Protein-calorie malnutrition ([place]) 01/10/2017   Chronic pain 10/02/2016   Depression, major, single episode, moderate ([place]) 08/31/2016   Chronic insomnia 08/31/2016   Lumbar spinal stenosis 08/18/2016   Wheelchair bound 08/18/2016   Dementia ([place])  08/18/2016   Urinary incontinence 08/18/2016   Polypharmacy 08/18/2016   Laryngopharyngeal reflux (LPR) 08/12/2015   Memory difficulty 08/04/2014   Numbness and tingling of right arm 08/04/2014   Abnormality of gait 08/01/2013   Anemia 12/05/2012   Early satiety 12/03/2012   History of colon polyps 12/03/2012   Facial droop 10/12/2012   Spinal stenosis of cervical region 07/01/2012   GERD 05/27/2009   DYSPHAGIA UNSPECIFIED 05/27/2009   CHANGE IN BOWELS 05/27/2009   SMOKER 09/09/2008   Essential hypertension 09/09/2008   History of stroke 09/09/2008   Constipation 09/09/2008   HIGH BLOOD PRESSURE 07/25/2006   Past Medical History:  Diagnosis Date   Abnormality of gait 08/01/2013   Arthritis    Blindness of left eye    Posttraumatic   Carotid artery disease (HCC)    Colon polyp    Constipation    Diaphragmatic hernia    Disc disease, degenerative, cervical    Diverticulitis    ED (erectile dysfunction)    Gastroparesis    GERD (gastroesophageal reflux disease)    Glaucoma    Heart disease    Hyperlipidemia    Hypertension    Insomnia    Memory difficulty 08/04/2014   Nicotine dependence    Numbness and tingling of right arm 08/04/2014   Osteoarthritis    Overactive bladder    Radiculopathy    Shortness of breath    with activity   Sleep apnea    does not use CPAP.  Tested maybe 5- 10 years ago   Stroke [facility])  Left side weakness.   Unilateral inguinal hernia    Past Surgical History:  Procedure Laterality Date   ANTERIOR CERVICAL DECOMP/DISCECTOMY FUSION N/A 07/01/2012   Procedure: ANTERIOR CERVICAL DECOMPRESSION/DISCECTOMY FUSION CERVICAL FIVE-SIX [name] REMOVAL CERVICAL SIX-SEVEN;  Surgeon: [name], MD;  Location: [place] [facility];  Service: Neurosurgery;  Laterality: N/A;   CERVICAL FUSION     COLONOSCOPY   08/04/2002     RMR: Normal rectum/Pancolonic diverticula/Colonic polyps as described above, biopsied and/or snared   COLONOSCOPY  60/08/2009   RMR:  normal rectum/left and right sided diverticula/multiple colonic polyps. tubular adenomas. surveillance due 2014   COLONOSCOPY WITH ESOPHAGOGASTRODUODENOSCOPY (EGD) N/A 12/18/2012   Dr. [name]:  Colonic diverticulosis. Multiple colonic polyps-hyperplastic polyps and tubular adenoma. Friable anal canal hemorrhoids. EGD with chronic atrophic gastritis   ESOPHAGOGASTRODUODENOSCOPY  04/14/2002   [id number]' ring, status post dilation as described above/Hiatal hernia, focal antral erosions of uncertain clinical significance   ESOPHAGOGASTRODUODENOSCOPY  06/09/2009   RMR: normal esophagus s/p dilator/small hiatal hernia otherwise normal   HERNIA REPAIR Right    inguinal   I & D EXTREMITY Right 04/23/2017   Procedure: Nelson HAND;  Surgeon: [name], MD;  Location: [facility];  Service: Orthopedics;  Laterality: Right;   INGUINAL HERNIA REPAIR Left 04/15/2014   Procedure: LEFT INGUINAL HERNIORRHAPHY WITH MESH;  Surgeon: [name], MD;  Location: [facility];  Service: General;  Laterality: Left;   INSERTION OF MESH Left 04/15/2014   Procedure: INSERTION OF MESH;  Surgeon: [name], MD;  Location: [facility];  Service: General;  Laterality: Left;   Social History   Tobacco Use   Smoking status: Every Day    Packs/day: 0.25    Years: 60.00    Total pack years: 15.00    Types: Cigarettes   Smokeless tobacco: Never   Tobacco comments:    using patch  Vaping Use   Vaping Use: Never used  Substance Use Topics   Alcohol use: No   Drug use: No   Social History   Socioeconomic History   Marital status: Widowed    Spouse name: Not on file   Number of children: 2   Years of education: hs   Highest education level: Not on file  Occupational History   Occupation: Retired    Fish farm manager: RETIRED  Tobacco Use   Smoking status: Every Day    Packs/day: 0.25    Years: 60.00    Total pack years: 15.00    Types: Cigarettes   Smokeless tobacco: Never   Tobacco comments:     using patch  Vaping Use   Vaping Use: Never used  Substance and Sexual Activity   Alcohol use: No   Drug use: No   Sexual activity: Not Currently  Other Topics Concern   Not on file  Social History Narrative   Patient drinks 1-2 cups of caffeine daily.   Patient is right handed.   Lives with his sister [name]   Has a nurse/ Caregiver, [name] Scales to help with bed baths, dressing, meals, etc 5 days per week   Social Determinants of Health   Financial Resource Strain: Low Risk  (08/31/2020)   Overall Financial Resource Strain (CARDIA)    Difficulty of Paying Living Expenses: Not very hard  Food Insecurity: No Food Insecurity (08/31/2020)   Hunger Vital Sign    Worried About Running Out of Food in the Last Year: Never true    Ran Out of Food  in the Last Year: Never true  Transportation Needs: No Transportation Needs (08/31/2020)   PRAPARE - Hydrologist (Medical): No    Lack of Transportation (Non-Medical): No  Physical Activity: Inactive (08/31/2020)   Exercise Vital Sign    Days of Exercise per Week: 0 days    Minutes of Exercise per Session: 0 min  Stress: No Stress Concern Present (08/31/2020)   [place]    Feeling of Stress : Not at all  Social Connections: Moderately Isolated (08/31/2020)   Social Connection and Isolation Panel [NHANES]    Frequency of Communication with Friends and Family: Twice a week    Frequency of Social Gatherings with Friends and Family: More than three times a week    Attends Religious Services: More than 4 times per year    Active Member of [employer] or Organizations: No    Attends Archivist Meetings: Never    Marital Status: Widowed  Intimate Partner Violence: Not At Risk (08/31/2020)   Humiliation, Afraid, Rape, and Kick questionnaire    Fear of Current or Ex-Partner: No    Emotionally Abused: No    Physically Abused: No     Sexually Abused: No   Family Status  Relation Name Status   Mother  Deceased at age 28   Father  Deceased at age 62       unknown   Sister  Alive   Brother  Alive   Brother  Alive   Neg Hx  (Not Specified)   Family History  Problem Relation Age of Onset   Cancer Mother    Dementia Father    Colon cancer Neg Hx    No Known Allergies    Review of Systems  Constitutional:  Positive for malaise/fatigue and weight loss. Negative for chills and fever.  HENT: Negative.    Eyes: Negative.   Respiratory: Negative.    Cardiovascular: Negative.   Musculoskeletal:  Positive for extremity weakness.  Skin: Negative.  Negative for itching and rash.  All other systems reviewed and are negative.     Objective:     BP (!) 86/56   Pulse 86   Ht '5\' 9"'$  (1.753 m)   SpO2 94%   BMI 23.93 kg/m  BP Readings from Last 3 Encounters:  08/24/21 (!) 86/56  08/20/21 115/63  08/12/21 131/68   Wt Readings from Last 3 Encounters:  08/20/21 162 lb 0.6 oz (73.5 kg)  08/12/21 162 lb (73.5 kg)  08/31/20 162 lb (73.5 kg)      Physical Exam Vitals and nursing note reviewed.  Constitutional:      Appearance: He is ill-appearing.  HENT:     Head: Normocephalic.     Right Ear: External ear normal.     Left Ear: External ear normal.     Nose: Nose normal.     Mouth/Throat:     Mouth: Mucous membranes are moist.     Pharynx: Oropharynx is clear.  Eyes:     Conjunctiva/sclera: Conjunctivae normal.  Cardiovascular:     Rate and Rhythm: Normal rate and regular rhythm.     Pulses: Normal pulses.     Heart sounds: Normal heart sounds.  Pulmonary:     Effort: Pulmonary effort is normal.     Breath sounds: Normal breath sounds.  Abdominal:     General: Bowel sounds are normal.  Skin:    Findings: No erythema or rash.  Neurological:     General: No focal deficit present.     Mental Status: He is alert and oriented to person, place, and time.  Psychiatric:        Behavior: Behavior  normal.      No results found for any visits on 08/24/21.  Last CBC Lab Results  Component Value Date   WBC 7.6 08/20/2021   HGB 13.4 08/20/2021   HCT 41.7 08/20/2021   MCV 96.3 08/20/2021   MCH 30.9 08/20/2021   RDW 14.3 08/20/2021   PLT 157 25/05/3974   Last metabolic panel Lab Results  Component Value Date   GLUCOSE 101 (H) 08/20/2021   NA 135 08/20/2021   K 4.2 08/20/2021   CL 106 08/20/2021   CO2 20 (L) 08/20/2021   BUN 18 08/20/2021   CREATININE 1.47 (H) 08/20/2021   GFRNONAA 46 (L) 08/20/2021   CALCIUM 8.6 (L) 08/20/2021   PROT 7.6 08/20/2021   ALBUMIN 3.7 08/20/2021   LABGLOB 3.1 07/12/2021   AGRATIO 1.3 07/12/2021   BILITOT 0.7 08/20/2021   ALKPHOS 74 08/20/2021   AST 28 08/20/2021   ALT 13 08/20/2021   ANIONGAP 9 08/20/2021   Last lipids Lab Results  Component Value Date   CHOL 113 07/12/2021   HDL 56 07/12/2021   LDLCALC 41 07/12/2021   TRIG 83 07/12/2021   CHOLHDL 2.0 07/12/2021   Last hemoglobin A1c Lab Results  Component Value Date   HGBA1C 5.9 (H) 12/23/2019   Last thyroid functions Lab Results  Component Value Date   TSH 1.53 11/20/2018   Last vitamin D Lab Results  Component Value Date   VD25OH 38.0 07/12/2021      The ASCVD Risk score (Arnett DK, et al., 2019) failed to calculate for the following reasons:   The 2019 ASCVD risk score is only valid for ages 41 to 37   The patient has a prior MI or stroke diagnosis    Assessment & Plan:  Assessed patient for hypotension, dehydration and hypovolemia and shock. 911 called for patients transportation to the hospital for rehydration and assessment of shock. Patient was unable to follow simple commands, he looked pale and week, patients lower lung was diminished .  Problem List Items Addressed This Visit   None   No follow-ups on file.    Ivy Lynn, NP

## 2021-08-24 NOTE — Assessment & Plan Note (Signed)
Resume Keppra 

## 2021-08-24 NOTE — Assessment & Plan Note (Signed)
Reported hypotension at PCPs office systolic down to 28A.  Likely due to dehydration.  -Hold lisinopril, Norvasc 5 mg

## 2021-08-24 NOTE — H&P (Signed)
History and Physical    [name] MAY:[id number] DOB: 1934/07/23 DOA: 08/24/2021  PCP: Ivy Lynn, NP   Patient coming from: Home I have personally briefly reviewed patient's old medical records in [place]  Chief Complaint: Weakness, hypotension  HPI: [name] is a 87 y.o. male with medical history significant for dementia, gait abnormality wheelchair-bound, hypertension, idiopathic pulmonary fibrosis, OSA. Patient was sent to the ED from primary care provider's office with reports of hypotension, dehydration/hypovolemia-pressure was 80/50.  Patient was unable to follow simple commands.   Per notes, patient has had barely any oral intake in the past 2 days, and urine output was decreased.  Blood pressure had been low. At the time of my evaluation, patient is awake and alert, he has baseline dementia, he is able to answer simple questions tell me his name and follow simple directions.  He denies difficulty breathing denies cough.  ED Course: On arrival to the ED, blood pressure ranged from 90-150.  Temperature 98.  Heart rate 60s to 80s.  Respiratory rate 14-22.  O2 sats down to 87 % on room air. WBC 6.8.  Creatinine 1.49.  BNP 16.  Lactic acid 0.9. 2 view chest x-ray shows focal opacity of the right upper lobe appears increased compared to prior exam, background fibrotic interstitial lung disease.  IV ceftriaxone started for pneumonia.  1 L bolus was given.  Hospitalist admit for acute respiratory failure.  Review of Systems: As per HPI all other systems reviewed and negative.  Past Medical History:  Diagnosis Date   Abnormality of gait 08/01/2013   Arthritis    Blindness of left eye    Posttraumatic   Carotid artery disease (HCC)    Colon polyp    Constipation    Diaphragmatic hernia    Disc disease, degenerative, cervical    Diverticulitis    ED (erectile dysfunction)    Gastroparesis    GERD (gastroesophageal reflux disease)    Glaucoma    Heart disease     Hyperlipidemia    Hypertension    Insomnia    Memory difficulty 08/04/2014   Nicotine dependence    Numbness and tingling of right arm 08/04/2014   Osteoarthritis    Overactive bladder    Radiculopathy    Shortness of breath    with activity   Sleep apnea    does not use CPAP.  Tested maybe 5- 10 years ago   Stroke [facility])    Left side weakness.   Unilateral inguinal hernia     Past Surgical History:  Procedure Laterality Date   ANTERIOR CERVICAL DECOMP/DISCECTOMY FUSION N/A 07/01/2012   Procedure: ANTERIOR CERVICAL DECOMPRESSION/DISCECTOMY FUSION CERVICAL FIVE-SIX [name] REMOVAL CERVICAL SIX-SEVEN;  Surgeon: [name], MD;  Location: [place] [facility];  Service: Neurosurgery;  Laterality: N/A;   CERVICAL FUSION     COLONOSCOPY   08/04/2002     RMR: Normal rectum/Pancolonic diverticula/Colonic polyps as described above, biopsied and/or snared   COLONOSCOPY  60/08/2009   RMR: normal rectum/left and right sided diverticula/multiple colonic polyps. tubular adenomas. surveillance due 2014   COLONOSCOPY WITH ESOPHAGOGASTRODUODENOSCOPY (EGD) N/A 12/18/2012   Dr. [name]:  Colonic diverticulosis. Multiple colonic polyps-hyperplastic polyps and tubular adenoma. Friable anal canal hemorrhoids. EGD with chronic atrophic gastritis   ESOPHAGOGASTRODUODENOSCOPY  04/14/2002   SEL:[id number]'I' ring, status post dilation as described above/Hiatal hernia, focal antral erosions of uncertain clinical significance   ESOPHAGOGASTRODUODENOSCOPY  06/09/2009   RMR: normal esophagus s/p dilator/small hiatal hernia otherwise normal  HERNIA REPAIR Right    inguinal   I & D EXTREMITY Right 04/23/2017   Procedure: IRRIGATION AND DEBRIDEMENT HAND;  Surgeon: [name], MD;  Location: [place];  Service: Orthopedics;  Laterality: Right;   INGUINAL HERNIA REPAIR Left 04/15/2014   Procedure: LEFT INGUINAL HERNIORRHAPHY WITH MESH;  Surgeon: [name], MD;  Location: [facility];  Service: General;  Laterality: Left;    INSERTION OF MESH Left 04/15/2014   Procedure: INSERTION OF MESH;  Surgeon: [name], MD;  Location: [facility];  Service: General;  Laterality: Left;     reports that he has been smoking cigarettes. He has a 15.00 pack-year smoking history. He has never used smokeless tobacco. He reports that he does not drink alcohol and does not use drugs.  No Known Allergies  Family History  Problem Relation Age of Onset   Cancer Mother    Dementia Father    Colon cancer Neg Hx    Prior to Admission medications   Medication Sig Start Date End Date Taking? Authorizing Provider  albuterol (VENTOLIN HFA) 108 (90 Base) MCG/ACT inhaler Inhale 2 puffs into the lungs every 4 (four) hours as needed for wheezing or shortness of breath. 11/03/20   Ivy Lynn, NP  amLODipine (NORVASC) 5 MG tablet TAKE (1) TABLET BY MOUTH AT BEDTIME. 08/23/21   Ivy Lynn, NP  aspirin EC 325 MG tablet TAKE 1 TABLET BY MOUTH ONCE A DAY. 08/23/21   Ivy Lynn, NP  atorvastatin (LIPITOR) 40 MG tablet TAKE (1) TABLET BY MOUTH AT BEDTIME. 02/17/21   Ivy Lynn, NP  fluticasone (FLONASE) 50 MCG/ACT nasal spray USE 1 OR 2 SPRAYS IN EACH NOSTRIL EVERY DAY FOR NASAL ALLERGIES. Patient taking differently: Place 2 sprays into both nostrils daily. 11/08/18   Alycia Rossetti, MD  levETIRAcetam (KEPPRA) 250 MG tablet TAKE (1) TABLET BY MOUTH EACH MORNING. 08/23/21   Ivy Lynn, NP  lisinopril (ZESTRIL) 10 MG tablet TAKE 1 TABLET BY MOUTH DAILY FOR HIGH BLOOD PRESSURE. TAKE W/20MG TABLET TO EQUAL 30MG. 08/23/21   Ivy Lynn, NP  lisinopril (ZESTRIL) 20 MG tablet TAKE 1 TABLET BY MOUTH ONCE A DAY. TAKE WITH 10MG TABLET FOR TOTAL DOSE OF 30MG. 08/23/21   Ivy Lynn, NP  LORazepam (ATIVAN) 1 MG tablet Take 1 tablet (1 mg total) by mouth at bedtime. 05/11/21   Ivy Lynn, NP  lubiprostone (AMITIZA) 24 MCG capsule TAKE 1 CAPSULE BY MOUTH DAILY 08/23/21   Ivy Lynn, NP  mirabegron ER (MYRBETRIQ) 50  MG TB24 tablet Take 1 tablet (50 mg total) by mouth daily. 08/12/21   Stoneking, Reece Leader., MD  mirabegron ER (MYRBETRIQ) 50 MG TB24 tablet Take 1 tablet (50 mg total) by mouth daily. 08/24/21   Stoneking, Reece Leader., MD  NITROSTAT 0.4 MG SL tablet PLACE 1 TAB UNDER TONGUE EVERY 5 MIN IF NEEDED FOR CHEST PAIN. MAY USE 3 TIMES.NO RELIEF CALL 911. Patient taking differently: Place 0.4 mg under the tongue every 5 (five) minutes as needed for chest pain. 12/18/17   [place], Modena Nunnery, MD  omeprazole (PRILOSEC) 20 MG capsule TAKE 1 CAPSULE BY MOUTH ONCE DAILY FOR RELFUX, ESOPHAGITIS, OR STOMACH ULCERS. 08/23/21   Ivy Lynn, NP  PARoxetine (PAXIL) 40 MG tablet TAKE 1 TABLET BY MOUTH ONCE A DAY. 08/23/21   Ivy Lynn, NP  QC VITAMIN B1 100 MG tablet TAKE 1 TABLET BY MOUTH ONCE A DAY. 08/23/21  Ivy Lynn, NP  Respiratory Therapy Supplies (NEBULIZER/TUBING/MOUTHPIECE) KIT Disp one nebulizer machine, tubing set and mouthpiece kit 08/17/17   Delsa Grana, PA-C  sulfamethoxazole-trimethoprim (BACTRIM DS) 800-160 MG tablet Take 1 tablet by mouth every 12 (twelve) hours for 14 days. 08/12/21 08/26/21  Stoneking, Reece Leader., MD  thiamine (VITAMIN B-1) 100 MG tablet Take 1 tablet (100 mg total) by mouth daily. 07/29/20   Ivy Lynn, NP  traZODone (DESYREL) 100 MG tablet TAKE (1) TABLET BY MOUTH AT BEDTIME AS NEEDED FOR SLEEP 08/23/21   Ivy Lynn, NP  vitamin B-12 (CYANOCOBALAMIN) 1000 MCG tablet Take one tablet by mouth once daily 02/17/21   Ivy Lynn, NP    Physical Exam: Vitals:   08/24/21 1930 08/24/21 1945 08/24/21 2000 08/24/21 2045  BP: 138/70  136/67 125/67  Pulse: 81 81  77  Resp: 19     Temp:      TempSrc:      SpO2: 95% 95% 96% 94%  Weight:      Height:        Constitutional: NAD, calm, comfortable Vitals:   08/24/21 1930 08/24/21 1945 08/24/21 2000 08/24/21 2045  BP: 138/70  136/67 125/67  Pulse: 81 81  77  Resp: 19     Temp:      TempSrc:      SpO2: 95%  95% 96% 94%  Weight:      Height:       Eyes: PERRL, lids and conjunctivae normal ENMT: Mucous membranes are moist. Neck: normal, supple, no masses, no thyromegaly Respiratory: clear to auscultation bilaterally, no wheezing, no crackles. Normal respiratory effort. No accessory muscle use.  Cardiovascular: Regular rate and rhythm, no murmurs / rubs / gallops. No extremity edema.  Abdomen: no tenderness, no masses palpated. No hepatosplenomegaly. Bowel sounds positive.  Musculoskeletal: no clubbing / cyanosis. No joint deformity upper and lower extremities.  Skin: no rashes, lesions, ulcers. No induration Neurologic: No facial asymmetry, able to move all extremities against gravity, good grip strength bilaterally.  Psychiatric: Unable to fully assess due to baseline dementia.   Labs on Admission: I have personally reviewed following labs and imaging studies  CBC: Recent Labs  Lab 08/20/21 1126 08/24/21 1521  WBC 7.6 6.8  NEUTROABS 4.3  --   HGB 13.4 12.3*  HCT 41.7 38.4*  MCV 96.3 94.1  PLT 157 656   Basic Metabolic Panel: Recent Labs  Lab 08/20/21 1126 08/24/21 1521  NA 135 137  K 4.2 4.3  CL 106 109  CO2 20* 23  GLUCOSE 101* 101*  BUN 18 28*  CREATININE 1.47* 1.49*  CALCIUM 8.6* 8.7*   GFR: Estimated Creatinine Clearance: 36.2 mL/min (A) (by C-G formula based on SCr of 1.49 mg/dL (H)). Liver Function Tests: Recent Labs  Lab 08/20/21 1126 08/24/21 1521  AST 28 39  ALT 13 17  ALKPHOS 74 63  BILITOT 0.7 0.7  PROT 7.6 7.5  ALBUMIN 3.7 3.1*   Urine analysis:    Component Value Date/Time   COLORURINE YELLOW 08/24/2021 1921   APPEARANCEUR CLEAR 08/24/2021 1921   APPEARANCEUR Clear 08/12/2021 1236   LABSPEC 1.020 08/24/2021 1921   PHURINE 6.0 08/24/2021 1921   GLUCOSEU NEGATIVE 08/24/2021 1921   HGBUR NEGATIVE 08/24/2021 1921   BILIRUBINUR NEGATIVE 08/24/2021 1921   BILIRUBINUR Negative 08/12/2021 1236   KETONESUR NEGATIVE 08/24/2021 1921   PROTEINUR 30  (A) 08/24/2021 1921   UROBILINOGEN 0.2 04/26/2014 0930   NITRITE NEGATIVE 08/24/2021 1921  LEUKOCYTESUR NEGATIVE 08/24/2021 1921    Radiological Exams on Admission: DG Chest 2 View  Result Date: 08/24/2021 CLINICAL DATA:  Shortness of breath with congestion. EXAM: CHEST - 2 VIEW COMPARISON:  Chest x-ray dated August 20, 2021 FINDINGS: Cardiac and mediastinal contours are unchanged. Calcified left hilar lymph node, likely sequela of prior granulomatous infection. Focal opacity of the right upper lobe appears slightly increased in density when compared with prior exam. Otherwise similar bilateral reticular opacities which are compatible with background fibrotic interstitial lung disease. No large pleural effusion or pneumothorax. IMPRESSION: Focal opacity of the right upper lobe appears slightly increased in density when compared with prior exam, possibly due to differences in technique, although superimposed could have this appearance. Electronically Signed   By: Yetta Glassman M.D.   On: 08/24/2021 15:16    EKG: Independently reviewed.  Sinus rhythm rate 81, QTc 440.  No significant change from prior.  Assessment/Plan Principal Problem:   Acute respiratory failure with hypoxia (HCC) Active Problems:   IPF (idiopathic pulmonary fibrosis) (HCC)   AKI (acute kidney injury) ([place])   Acute metabolic encephalopathy   Essential hypertension   Wheelchair dependence   Dementia (HCC)   Depression, major, single episode, moderate (HCC)   Seizure (HCC)   OSA (obstructive sleep apnea)   Assessment and Plan: * Acute respiratory failure with hypoxia (HCC) O2 sats intermittently down to 87% on room air.  He denies difficulty breathing no chest pain no cough.  Two-view chest x-ray shows focal opacity of the right upper lobe which appears increased intensity when compared to prior exam, bilateral reticular opacities compatible with background fibrotic interstitial lung disease. -Hypoxia likely  secondary to chronic lung disease -Initial hypotension that resolved prior to arrival in the ED, not tachycardic, no symptoms of PE, after held off on further imaging at this time to rule out PE due to AKI. -If further concerns may need to order CTA pending improvement in creatinine with hydration -Ceftriaxone was started for possible pneumonia, further antibiotics held for now  Acute metabolic encephalopathy Baseline dementia.  Reportedly patient was hypotensive and not following directions in the place of this.  He appears to be at his baseline here.  Likely due to poor oral intake and hypotension.   -Started 14-day course of Bactrim which was prescribed 8/11 for UTI.  UA today not suggestive of infection. -As mental status has improved hold off on further imaging for now   AKI (acute kidney injury) ([place]) Creatinine elevated at 1.4, baseline about 1.0.  Likely due to poor oral intake and possibly Bactrim.  Barely any oral intake in the past 2 days, Initial hypotension. -1 L bolus given, continue n/s 75cc/hr -Hold lisinopril 10 mg -Hold Bactrim  IPF (idiopathic pulmonary fibrosis) (HCC) Likely cause of hypoxia.  Work-up in the past-2019, last pulmonology visit 2021, patient of Ramaswamy.  Was thought that due to his dementia, patient would not be able to follow directions for repeat PFT or spirometry and would not be a candidate for antifibrotic's due to weight loss and GI side effects.  He was also supposed to be on BiPAP for OSA. -May need supplemental O2  Seizure ([place]) Resume Keppra  Essential hypertension Reported hypotension at PCPs office systolic down to 95K.  Likely due to dehydration.  -Hold lisinopril, Norvasc 5 mg   DVT prophylaxis: Heparin Code Status: Full Family Communication: None at bedside Disposition Plan: ~ 2 days Consults called: None Admission status:  Obs tele   Author: Pavel Gadd E  Denton Brick, MD 08/24/2021 11:03 PM  For on call review [email or web address].

## 2021-08-24 NOTE — Assessment & Plan Note (Deleted)
Reportedly patient was hypotensive and not following directions in the place of this.  He appears to be at his baseline here.  Likely due to poor oral intake and hypotension.

## 2021-08-24 NOTE — Assessment & Plan Note (Addendum)
Baseline dementia.  Reportedly patient was hypotensive and not following directions in the place of this.  He appears to be at his baseline here.  Likely due to poor oral intake and hypotension.   -Started 14-day course of Bactrim which was prescribed 8/11 for UTI.  UA today not suggestive of infection. -As mental status has improved hold off on further imaging for now

## 2021-08-24 NOTE — ED Notes (Signed)
[name]- sister- give updates and will pick up for dc- [phone]

## 2021-08-24 NOTE — Assessment & Plan Note (Addendum)
Likely cause of hypoxia.  Work-up in the past-2019, last pulmonology visit 2021, patient of Ramaswamy.  Was thought that due to his dementia, patient would not be able to follow directions for repeat PFT or spirometry and would not be a candidate for antifibrotic's due to weight loss and GI side effects.  He was also supposed to be on BiPAP for OSA. -May need supplemental O2

## 2021-08-24 NOTE — ED Provider Notes (Signed)
[facility] EMERGENCY DEPARTMENT Provider Note   CSN: [id number] Arrival date & time: 08/24/21  1255     History  Chief Complaint  Patient presents with   Weakness    [name] is a 87 y.o. male with a past medical history significant for dementia, tobacco abuse, advanced age, hypertension, history of stroke, gastroparesis, who presents with concern for generalized weakness, hypotension.  Patient was seen by PCP, BP was 80/50.  On arrival patient with normotensive blood pressure, 124/80, now 137/65.  His family, aide reports that he has not had much p.o. intake over the last several days, and his PCP was worried about some possible shortness of breath or other lung disease.  He does have a history of pulmonary fibrosis, I do hear some rattling in the chest.  No fever, chills, nausea, vomiting.  No fall since the other day.  He is wheelchair-bound at baseline.  Patient reports that he is "has not wanted to eat".  He denies any chest pain.  Level 5 caveat secondary to dementia.   Weakness      Home Medications Prior to Admission medications   Medication Sig Start Date End Date Taking? Authorizing Provider  albuterol (VENTOLIN HFA) 108 (90 Base) MCG/ACT inhaler Inhale 2 puffs into the lungs every 4 (four) hours as needed for wheezing or shortness of breath. 11/03/20   Ivy Lynn, NP  amLODipine (NORVASC) 5 MG tablet TAKE (1) TABLET BY MOUTH AT BEDTIME. 08/23/21   Ivy Lynn, NP  aspirin EC 325 MG tablet TAKE 1 TABLET BY MOUTH ONCE A DAY. 08/23/21   Ivy Lynn, NP  atorvastatin (LIPITOR) 40 MG tablet TAKE (1) TABLET BY MOUTH AT BEDTIME. 02/17/21   Ivy Lynn, NP  fluticasone (FLONASE) 50 MCG/ACT nasal spray USE 1 OR 2 SPRAYS IN EACH NOSTRIL EVERY DAY FOR NASAL ALLERGIES. Patient taking differently: Place 2 sprays into both nostrils daily. 11/08/18   Alycia Rossetti, MD  levETIRAcetam (KEPPRA) 250 MG tablet TAKE (1) TABLET BY MOUTH EACH MORNING. 08/23/21   Ivy Lynn, NP  lisinopril (ZESTRIL) 10 MG tablet TAKE 1 TABLET BY MOUTH DAILY FOR HIGH BLOOD PRESSURE. TAKE W/20MG TABLET TO EQUAL 30MG. 08/23/21   Ivy Lynn, NP  lisinopril (ZESTRIL) 20 MG tablet TAKE 1 TABLET BY MOUTH ONCE A DAY. TAKE WITH 10MG TABLET FOR TOTAL DOSE OF 30MG. 08/23/21   Ivy Lynn, NP  LORazepam (ATIVAN) 1 MG tablet Take 1 tablet (1 mg total) by mouth at bedtime. 05/11/21   Ivy Lynn, NP  lubiprostone (AMITIZA) 24 MCG capsule TAKE 1 CAPSULE BY MOUTH DAILY 08/23/21   Ivy Lynn, NP  mirabegron ER (MYRBETRIQ) 50 MG TB24 tablet Take 1 tablet (50 mg total) by mouth daily. 08/12/21   Stoneking, Reece Leader., MD  mirabegron ER (MYRBETRIQ) 50 MG TB24 tablet Take 1 tablet (50 mg total) by mouth daily. 08/24/21   Stoneking, Reece Leader., MD  NITROSTAT 0.4 MG SL tablet PLACE 1 TAB UNDER TONGUE EVERY 5 MIN IF NEEDED FOR CHEST PAIN. MAY USE 3 TIMES.NO RELIEF CALL 911. Patient taking differently: Place 0.4 mg under the tongue every 5 (five) minutes as needed for chest pain. 12/18/17   [place], Modena Nunnery, MD  omeprazole (PRILOSEC) 20 MG capsule TAKE 1 CAPSULE BY MOUTH ONCE DAILY FOR RELFUX, ESOPHAGITIS, OR STOMACH ULCERS. 08/23/21   Ivy Lynn, NP  PARoxetine (PAXIL) 40 MG tablet TAKE 1 TABLET BY MOUTH ONCE A DAY. 08/23/21  Ivy Lynn, NP  QC VITAMIN B1 100 MG tablet TAKE 1 TABLET BY MOUTH ONCE A DAY. 08/23/21   Ivy Lynn, NP  Respiratory Therapy Supplies (NEBULIZER/TUBING/MOUTHPIECE) KIT Disp one nebulizer machine, tubing set and mouthpiece kit 08/17/17   Delsa Grana, PA-C  sulfamethoxazole-trimethoprim (BACTRIM DS) 800-160 MG tablet Take 1 tablet by mouth every 12 (twelve) hours for 14 days. 08/12/21 08/26/21  Stoneking, Reece Leader., MD  thiamine (VITAMIN B-1) 100 MG tablet Take 1 tablet (100 mg total) by mouth daily. 07/29/20   Ivy Lynn, NP  traZODone (DESYREL) 100 MG tablet TAKE (1) TABLET BY MOUTH AT BEDTIME AS NEEDED FOR SLEEP 08/23/21   Ivy Lynn, NP   vitamin B-12 (CYANOCOBALAMIN) 1000 MCG tablet Take one tablet by mouth once daily 02/17/21   Ivy Lynn, NP      Allergies    Patient has no known allergies.    Review of Systems   Review of Systems  Neurological:  Positive for weakness.  All other systems reviewed and are negative.   Physical Exam Updated Vital Signs BP 125/81   Pulse 67   Temp 98 F (36.7 C) (Oral)   Resp 20   Ht 5' 9"  (1.753 m)   Wt 73.5 kg   SpO2 95%   BMI 23.92 kg/m  Physical Exam Vitals and nursing note reviewed.  Constitutional:      General: He is not in acute distress.    Appearance: Normal appearance. He is ill-appearing.     Comments: He is ill-appearing at baseline, with advanced dementia and, signs of protein calorie malnutrition, and advanced age  HENT:     Head: Normocephalic and atraumatic.     Comments: Patient has multiple missing teeth, but no other sores or lesions noted in the oropharynx Eyes:     General:        Right eye: No discharge.        Left eye: No discharge.  Cardiovascular:     Rate and Rhythm: Normal rate and regular rhythm.     Heart sounds: No murmur heard.    No friction rub. No gallop.  Pulmonary:     Effort: Pulmonary effort is normal.     Breath sounds: Normal breath sounds.     Comments: Patient has crackles in the chest, right greater than left, shallow respirations but no respiratory distress, tachypnea, wheezing. Abdominal:     General: Bowel sounds are normal.     Palpations: Abdomen is soft.  Musculoskeletal:     Comments: He has generalized atrophy throughout.  He follows commands spontaneously with some delay.  Skin:    General: Skin is warm and dry.     Capillary Refill: Capillary refill takes less than 2 seconds.  Neurological:     Mental Status: He is alert.     Comments: Alert and oriented to self, he does have some comprehensible speech but does also have some confusion.  Psychiatric:        Mood and Affect: Mood normal.         Behavior: Behavior normal.     ED Results / Procedures / Treatments   Labs (all labs ordered are listed, but only abnormal results are displayed) Labs Reviewed  CBC - Abnormal; Notable for the following components:      Result Value   RBC 4.08 (*)    Hemoglobin 12.3 (*)    HCT 38.4 (*)    All other components within normal limits  COMPREHENSIVE METABOLIC PANEL - Abnormal; Notable for the following components:   Glucose, Bld 101 (*)    BUN 28 (*)    Creatinine, Ser 1.49 (*)    Calcium 8.7 (*)    Albumin 3.1 (*)    GFR, Estimated 46 (*)    All other components within normal limits  URINALYSIS, ROUTINE W REFLEX MICROSCOPIC - Abnormal; Notable for the following components:   Protein, ur 30 (*)    All other components within normal limits  SARS CORONAVIRUS 2 BY RT PCR  BRAIN NATRIURETIC PEPTIDE  LACTIC ACID, PLASMA    EKG None  Radiology DG Chest 2 View  Result Date: 08/24/2021 CLINICAL DATA:  Shortness of breath with congestion. EXAM: CHEST - 2 VIEW COMPARISON:  Chest x-ray dated August 20, 2021 FINDINGS: Cardiac and mediastinal contours are unchanged. Calcified left hilar lymph node, likely sequela of prior granulomatous infection. Focal opacity of the right upper lobe appears slightly increased in density when compared with prior exam. Otherwise similar bilateral reticular opacities which are compatible with background fibrotic interstitial lung disease. No large pleural effusion or pneumothorax. IMPRESSION: Focal opacity of the right upper lobe appears slightly increased in density when compared with prior exam, possibly due to differences in technique, although superimposed could have this appearance. Electronically Signed   By: Yetta Glassman M.D.   On: 08/24/2021 15:16    Procedures Procedures    Medications Ordered in ED Medications  cefTRIAXone (ROCEPHIN) 1 g in sodium chloride 0.9 % 100 mL IVPB (1 g Intravenous New Bag/Given 08/24/21 2216)  sodium chloride 0.9 %  bolus 1,000 mL (0 mLs Intravenous Stopped 08/24/21 2147)    ED Course/ Medical Decision Making/ A&P Clinical Course as of 08/24/21 2241  Wed Aug 24, 2021  1714 Patient had desaturation to 89%, think that it may be slightly postural as patient was slumped out of bed, with slightly decreased respiratory effort, however did require 2 L nasal cannula to return to normal O2 saturation.  He has had several intermittent episodes of low blood pressure again, systolic min around 90 when I went to reevaluate the patient.  Clinically he does not seem strongly suggestive of an active pneumonia, however he has had a cough at the house, and questionable infiltrate on chest x-ray could be representative of early pneumonia [CP]  2054 On reevaluation patient with desaturation down to 87, 88%, had to be replaced on oxygen.  Does admit to cough at the house, possible infiltrate on chest x-ray. [CP]    Clinical Course User Index [CP] Anselmo Pickler, PA-C                           Medical Decision Making Amount and/or Complexity of Data Reviewed Labs: ordered. Radiology: ordered.  Risk Decision regarding hospitalization.   This patient is a 87 y.o. male who presents to the ED for concern of generalized weakness, hypotension at PCP office, not eating this involves an extensive number of treatment options, and is a complaint that carries with it a high risk of complications and morbidity. The emergent differential diagnosis prior to evaluation includes, but is not limited to, sepsis, pneumonia, UTI, electrolyte abnormality, dehydration, versus other.   This is not an exhaustive differential.   Past Medical History / Co-morbidities / Social History: dementia, tobacco abuse, advanced age, hypertension, history of stroke, gastroparesis  Additional history: Chart reviewed. Pertinent results include: Reviewed outpatient telemedicine visits, lab work, imaging from recent  previous emergency department visits,  as well as outpatient urology visits  Physical Exam: Physical exam performed. The pertinent findings include: Patient is overall well-appearing, however he is having persistent desaturations of his oxygen on room air.  He does have some crackles in the right lung, as well as an occasional wet sounding cough.  He is not febrile and is otherwise well-appearing but he is somewhat diminutive, and show some signs of general protein calorie malnutrition secondary to poor oral intake.  Lab Tests: I ordered, and personally interpreted labs.  The pertinent results include: CMP overall fairly stable compared to baseline, BUN 28, creatinine 1.49 compared to baseline of BUN of 18, creatinine of 1.47.  Elevated BUN suggest that patient may be having some prerenal azotemia.  He does have decreased albumin of 3.1 compared to baseline of 3.7 recently suggesting decreased oral intake.  CBC overall unremarkable, has mild anemia hemoglobin 12.3.  Urinalysis is unremarkable, RVP is negative for COVID.  BNP is normal, lactic acid is normal.   Imaging Studies: I ordered imaging studies including plain film chest x-ray. I independently visualized and interpreted imaging which showed focal opacity in the right upper lobe which may be slightly. I agree with the radiologist interpretation.   Cardiac Monitoring:  The patient was maintained on a cardiac monitor.  My attending physician Dr. Pearline Cables viewed and interpreted the cardiac monitored which showed an underlying rhythm of: NSR, prolonged PR interval. I agree with this interpretation.   Medications: I ordered medication including fluid bolus, rocephin for mild appearance of dehydration, UTI.  Patient will require reevaluation in the inpatient setting, he is stable at this time the emergency department, as well as his improved on 2 L nasal cannula.  Consultations Obtained: I requested consultation with the hospitalist, spoke with Dr. Denton Brick,  and discussed lab and imaging  findings as well as pertinent plan - they recommend: Admission   Disposition: After consideration of the diagnostic results and the patients response to treatment, I feel that patient would benefit from admission.   I discussed this case with my attending physician Dr. Pearline Cables who cosigned this note including patient's presenting symptoms, physical exam, and planned diagnostics and interventions. Attending physician stated agreement with plan or made changes to plan which were implemented.    Final Clinical Impression(s) / ED Diagnoses Final diagnoses:  Community acquired pneumonia of right upper lobe of lung    Rx / DC Orders ED Discharge Orders     None         Dorien Chihuahua 08/24/21 2241    Milton Ferguson, MD 08/25/21 1754

## 2021-08-24 NOTE — Assessment & Plan Note (Addendum)
Creatinine elevated at 1.4, baseline about 1.0.  Likely due to poor oral intake and possibly Bactrim.  Barely any oral intake in the past 2 days, Initial hypotension. -1 L bolus given, continue n/s 75cc/hr -Hold lisinopril 10 mg -Hold Bactrim

## 2021-08-24 NOTE — ED Triage Notes (Signed)
"  Patient went to PCP with family from home today for check up and complained of generalized weakness. Provider stated his BP was 80/50 and called EMS to take him to ED for hypotension. On our arrival BP was 124/80 and has remained normal during transport. Generalized weakness noted, but consistent with his baseline" per EMS On arrival patient is Alert and confused at baseline with history of dementia. Follows all commands. Denies pain.

## 2021-08-25 ENCOUNTER — Encounter (HOSPITAL_COMMUNITY): Payer: Self-pay | Admitting: Internal Medicine

## 2021-08-25 DIAGNOSIS — R29898 Other symptoms and signs involving the musculoskeletal system: Secondary | ICD-10-CM | POA: Diagnosis not present

## 2021-08-25 DIAGNOSIS — Z7401 Bed confinement status: Secondary | ICD-10-CM | POA: Diagnosis not present

## 2021-08-25 DIAGNOSIS — J9601 Acute respiratory failure with hypoxia: Secondary | ICD-10-CM | POA: Diagnosis not present

## 2021-08-25 LAB — BASIC METABOLIC PANEL
Anion gap: 5 (ref 5–15)
BUN: 22 mg/dL (ref 8–23)
CO2: 23 mmol/L (ref 22–32)
Calcium: 8.5 mg/dL — ABNORMAL LOW (ref 8.9–10.3)
Chloride: 109 mmol/L (ref 98–111)
Creatinine, Ser: 0.99 mg/dL (ref 0.61–1.24)
GFR, Estimated: >60 mL/min (ref >60)
Glucose, Bld: 95 mg/dL (ref 70–99)
Potassium: 4.5 mmol/L (ref 3.5–5.1)
Sodium: 137 mmol/L (ref 135–145)

## 2021-08-25 LAB — CBC
HCT: 38.7 % — ABNORMAL LOW (ref 39.0–52.0)
Hemoglobin: 12.6 g/dL — ABNORMAL LOW (ref 13.0–17.0)
MCH: 30.7 pg (ref 26.0–34.0)
MCHC: 32.6 g/dL (ref 30.0–36.0)
MCV: 94.4 fL (ref 80.0–100.0)
Platelets: 163 10*3/uL (ref 150–400)
RBC: 4.1 MIL/uL — ABNORMAL LOW (ref 4.22–5.81)
RDW: 14.2 % (ref 11.5–15.5)
WBC: 7 10*3/uL (ref 4.0–10.5)
nRBC: 0 % (ref 0.0–0.2)

## 2021-08-25 NOTE — TOC Progression Note (Signed)
  Transition of Care [facility]) Screening Note   Patient Details  Name: [name] Date of Birth: [birth year]   Transition of Care [facility]) CM/SW Contact:    Shade Flood, LCSW Phone Number: 08/25/2021, 11:33 AM    Awaiting Palliative Consult. TOC will follow up after that if there are identified dc needs.  Transition of Care Department [facility]) has reviewed patient and no TOC needs have been identified at this time. We will continue to monitor patient advancement through interdisciplinary progression rounds. If new patient transition needs arise, please place a TOC consult.

## 2021-08-25 NOTE — TOC Transition Note (Addendum)
Transition of Care [facility]) - CM/SW Discharge Note   Patient Details  Name: [name] MRN: [id number] Date of Birth: 02/10/1934  Transition of Care [facility]) CM/SW Contact:  Shade Flood, LCSW Phone Number: 08/25/2021, 1:45 PM   Clinical Narrative:     Pt will dc home today. Per MD, pt's sister agreeable to palliative/hospice referral. This LCSW spoke with pt's sister by phone to review dc planning. Sister agreeable to hospice referral. CMS provider options reviewed. Referred to [facility] per sister's request.   There are no other TOC needs for dc.  1623: Multiple discussions with pt's RN and also hospice rep. Family states that they do not have room for a hospital bed and will keep pt in the bed they have. Pt has an aide Monday-Saturday and the aide had called hospice rep with concerns about family ability to manage care when she is not there. Pt's sister states that they can handle it and want pt to come home. Hospice RN will be there tomorrow at [facility] to assess. Pt will go home via EMS.  Expected Discharge Plan: Home/Self Care Barriers to Discharge: Barriers Resolved   Patient Goals and CMS Choice Patient states their goals for this hospitalization and ongoing recovery are:: go home CMS Medicare.gov Compare Post Acute Care list provided to:: Patient Represenative (must comment) Choice offered to / list presented to : Sibling  Expected Discharge Plan and Services Expected Discharge Plan: Home/Self Care In-house Referral: Clinical Social Work   Post Acute Care Choice: Hospice Living arrangements for the past 2 months: Single Family Home Expected Discharge Date: 08/25/21                                    Prior Living Arrangements/Services Living arrangements for the past 2 months: Single Family Home Lives with:: Siblings Patient language and need for interpreter reviewed:: Yes Do you feel safe going back to the place where you live?: Yes      Need for  Family Participation in Patient Care: Yes (Comment) Care giver support system in place?: Yes (comment)   Criminal Activity/Legal Involvement Pertinent to Current Situation/Hospitalization: No - Comment as needed  Activities of Daily Living Home Assistive Devices/Equipment: Eyeglasses, Hearing aid, Wheelchair ADL Screening (condition at time of admission) Patient's cognitive ability adequate to safely complete daily activities?: No Is the patient deaf or have difficulty hearing?: No Does the patient have difficulty seeing, even when wearing glasses/contacts?: Yes (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) Does the patient have difficulty concentrating, remembering, or making decisions?: Yes Patient able to express need for assistance with ADLs?: No Does the patient have difficulty dressing or bathing?: Yes Independently performs ADLs?: No Communication: Independent Dressing (OT): Dependent Is this a change from baseline?: Pre-admission baseline Grooming: Needs assistance Is this a change from baseline?: Pre-admission baseline Feeding: Needs assistance Is this a change from baseline?: Pre-admission baseline Bathing: Dependent Is this a change from baseline?: Pre-admission baseline Toileting: Dependent Is this a change from baseline?: Pre-admission baseline In/Out Bed: Dependent Is this a change from baseline?: Pre-admission baseline Walks in Home: Dependent Is this a change from baseline?: Pre-admission baseline Does the patient have difficulty walking or climbing stairs?: Yes Weakness of Legs: None Weakness of Arms/Hands: None  Permission Sought/Granted Permission sought to share information with : Facility Art therapist granted to share information with : Yes, Verbal Permission Granted     Permission granted  to share info w AGENCY: [facility]        Emotional Assessment       Orientation: : Oriented to Self Alcohol / Substance Use: Not  Applicable Psych Involvement: No (comment)  Admission diagnosis:  Acute respiratory failure with hypoxia ([place]) [J96.01] Patient Active Problem List   Diagnosis Date Noted   Acute respiratory failure with hypoxia ([place]) 08/24/2021   AKI (acute kidney injury) ([place]) 52/77/8242   Acute metabolic encephalopathy 35/36/1443   Anxiety 01/07/2021   Elevated PSA 10/28/2020   Urinary retention 10/25/2020   Abnormal urine odor 04/21/2020   Depression, recurrent ([place]) 04/21/2020   Cerebrovascular accident (CVA) due to embolism of left middle cerebral artery ([place]) 12/14/2018   Abnormal findings on diagnostic imaging of lung 08/19/2018   IPF (idiopathic pulmonary fibrosis) ([place]) 08/19/2018   OSA (obstructive sleep apnea) 08/19/2018   Carotid artery disease ([place]) 03/26/2017   Internal carotid artery stenosis, left 03/06/2017   Dysphagia 03/06/2017   Pulmonary nodules 03/06/2017   Seizure ([place]) 03/03/2017   Protein-calorie malnutrition ([place]) 01/10/2017   Chronic pain 10/02/2016   Depression, major, single episode, moderate ([place]) 08/31/2016   Chronic insomnia 08/31/2016   Lumbar spinal stenosis 08/18/2016   Wheelchair dependence 08/18/2016   Dementia ([place]) 08/18/2016   Urinary incontinence 08/18/2016   Polypharmacy 08/18/2016   Laryngopharyngeal reflux (LPR) 08/12/2015   Memory difficulty 08/04/2014   Numbness and tingling of right arm 08/04/2014   Abnormality of gait 08/01/2013   Anemia 12/05/2012   Early satiety 12/03/2012   History of colon polyps 12/03/2012   Facial droop 10/12/2012   Spinal stenosis of cervical region 07/01/2012   GERD 05/27/2009   DYSPHAGIA UNSPECIFIED 05/27/2009   CHANGE IN BOWELS 05/27/2009   SMOKER 09/09/2008   Essential hypertension 09/09/2008   History of stroke 09/09/2008   Constipation 09/09/2008   HIGH BLOOD PRESSURE 07/25/2006   PCP:  Ivy Lynn, NP Pharmacy:   [facility] [address] [place]  [address] Phone: [phone] Fax: [phone]     Social Determinants of Health (SDOH) Interventions    Readmission Risk Interventions     No data to display           Final next level of care: Home/Self Care Barriers to Discharge: Barriers Resolved   Patient Goals and CMS Choice Patient states their goals for this hospitalization and ongoing recovery are:: go home CMS Medicare.gov Compare Post Acute Care list provided to:: Patient Represenative (must comment) Choice offered to / list presented to : Sibling  Discharge Placement                       Discharge Plan and Services In-house Referral: Clinical Social Work   Post Acute Care Choice: Hospice                               Social Determinants of Health (SDOH) Interventions     Readmission Risk Interventions     No data to display

## 2021-08-25 NOTE — ED Notes (Signed)
Pts brother asked if we could dc quickly. RN collected papers and went back into the room to assist with dressing the pt and the brother/transporter was gone. Pt and RN waited about an hour, while attempting to make calls to the family, looking in the lobby and parkinglot and communication with the MD and SW.  Brother returned. Pt is unable to sit up unsupported and was not able to stand at all. Discussion was had on what the plan was to safely get pt in vehicle and safely inside the home. Pts brother stated at pts baseline he is unable to walk especially into his large truck. It was decided that an EMS transport was the best plan for the pt. EMS was contacted and pt was placed on the list. Care team, family and pt have all been updated. SW was consulted and it has been arranged for hospice to be at the home by 9am Friday, Aug 25th.  Pt is waiting for transport.

## 2021-08-25 NOTE — ED Notes (Signed)
Pt pulled off Purewick, leads, and IV . Linens changed.

## 2021-08-25 NOTE — Discharge Summary (Signed)
Physician Discharge Summary  [name] QRF:[id number] DOB: 1934/09/14 DOA: 08/24/2021  PCP: Ivy Lynn, NP  Admit date: 08/24/2021  Discharge date: 08/25/2021  Admitted From:Home  Disposition:  Home  Recommendations for Outpatient Follow-up:  Follow up with PCP in 1-2 weeks Hold home lisinopril and amlodipine for now given increasing weight loss and poor appetite with low blood pressure readings noted, follow-up with PCP regarding blood pressure management Continue other home medications as prior  Home Health: Outpatient palliative follow up  Equipment/Devices: None  Discharge Condition:Stable  CODE STATUS: Full  Diet recommendation: Heart Healthy  Brief/Interim Summary: [name] is a 87 y.o. male with medical history significant for dementia, gait abnormality wheelchair-bound, hypertension, idiopathic pulmonary fibrosis, OSA. Patient was sent to the ED from primary care provider's office with reports of hypotension, dehydration/hypovolemia-pressure was 80/50.  Patient was unable to follow simple commands.  Patient was admitted with concerns for acute metabolic encephalopathy as well as acute hypoxemic respiratory failure in the setting of idiopathic pulmonary fibrosis.  He is oxygen has been weaned off and he now appears to be at baseline with his dementia history and no significant confusion noted.  He was apparently having very poor oral intake over the last several weeks and especially of the last few days and appears to have become dehydrated and had prerenal AKI which was resolved with IV fluid administration.  It has been recommended that he remain off of his blood pressure agents for now and follow-up with his PCP before reinitiating given the fact that he appears to not be eating very well.  Overall it appears that he is now having failure to thrive in the setting of his dementia that is getting advanced.  This was discussed with his sister on the phone and she appears to  agree that he has had some significant weight loss recently.  Outpatient palliative will be ordered to follow-up for goals of care.  Patient would like to remain full code for now.  No other acute events noted during this brief admission.  Discharge Diagnoses:  Principal Problem:   Acute respiratory failure with hypoxia (HCC) Active Problems:   IPF (idiopathic pulmonary fibrosis) (HCC)   AKI (acute kidney injury) ([place])   Acute metabolic encephalopathy   Essential hypertension   Wheelchair dependence   Dementia (HCC)   Depression, major, single episode, moderate (HCC)   Seizure (HCC)   OSA (obstructive sleep apnea)  Principal discharge diagnosis: Acute metabolic encephalopathy in the setting of AKI due to dehydration and poor oral intake.  Idiopathic pulmonary fibrosis with mild hypoxemia-resolved.  Advancement of dementia with failure to thrive.  Discharge Instructions  Discharge Instructions     Amb Referral to Palliative Care   Complete by: As directed    Diet - low sodium heart healthy   Complete by: As directed    Increase activity slowly   Complete by: As directed       Allergies as of 08/25/2021   No Known Allergies      Medication List     STOP taking these medications    amLODipine 5 MG tablet Commonly known as: NORVASC   lisinopril 10 MG tablet Commonly known as: ZESTRIL   lisinopril 20 MG tablet Commonly known as: ZESTRIL       TAKE these medications    albuterol (2.5 MG/3ML) 0.083% nebulizer solution Commonly known as: PROVENTIL Take 2.5 mg by nebulization daily as needed for wheezing or shortness of breath.   aspirin EC 325  MG tablet TAKE 1 TABLET BY MOUTH ONCE A DAY.   atorvastatin 40 MG tablet Commonly known as: LIPITOR TAKE (1) TABLET BY MOUTH AT BEDTIME.   cyanocobalamin 1000 MCG tablet Commonly known as: VITAMIN B12 Take one tablet by mouth once daily   levETIRAcetam 250 MG tablet Commonly known as: KEPPRA TAKE (1) TABLET BY  MOUTH EACH MORNING. What changed: See the new instructions.   LORazepam 1 MG tablet Commonly known as: ATIVAN Take 1 tablet (1 mg total) by mouth at bedtime.   lubiprostone 24 MCG capsule Commonly known as: AMITIZA TAKE 1 CAPSULE BY MOUTH DAILY What changed: when to take this   mirabegron ER 50 MG Tb24 tablet Commonly known as: Myrbetriq Take 1 tablet (50 mg total) by mouth daily.   Nebulizer/Tubing/Mouthpiece Kit Disp one nebulizer machine, tubing set and mouthpiece kit   Nitrostat 0.4 MG SL tablet Generic drug: nitroGLYCERIN PLACE 1 TAB UNDER TONGUE EVERY 5 MIN IF NEEDED FOR CHEST PAIN. MAY USE 3 TIMES.NO RELIEF CALL 911. What changed: See the new instructions.   omeprazole 20 MG capsule Commonly known as: PRILOSEC TAKE 1 CAPSULE BY MOUTH ONCE DAILY FOR RELFUX, ESOPHAGITIS, OR STOMACH ULCERS. What changed: See the new instructions.   PARoxetine 40 MG tablet Commonly known as: PAXIL TAKE 1 TABLET BY MOUTH ONCE A DAY.   QC Vitamin B1 100 MG tablet Generic drug: thiamine TAKE 1 TABLET BY MOUTH ONCE A DAY.   sulfamethoxazole-trimethoprim 800-160 MG tablet Commonly known as: BACTRIM DS Take 1 tablet by mouth every 12 (twelve) hours for 14 days.   traZODone 100 MG tablet Commonly known as: DESYREL TAKE (1) TABLET BY MOUTH AT BEDTIME AS NEEDED FOR SLEEP What changed: See the new instructions.        Follow-up Information     Ivy Lynn, NP. Schedule an appointment as soon as possible for a visit in 1 week(s).   Specialty: Nurse Practitioner Contact information: [place] [address] [phone]                No Known Allergies  Consultations: Palliative   Procedures/Studies: DG Chest 2 View  Result Date: 08/24/2021 CLINICAL DATA:  Shortness of breath with congestion. EXAM: CHEST - 2 VIEW COMPARISON:  Chest x-ray dated August 20, 2021 FINDINGS: Cardiac and mediastinal contours are unchanged. Calcified left hilar lymph node,  likely sequela of prior granulomatous infection. Focal opacity of the right upper lobe appears slightly increased in density when compared with prior exam. Otherwise similar bilateral reticular opacities which are compatible with background fibrotic interstitial lung disease. No large pleural effusion or pneumothorax. IMPRESSION: Focal opacity of the right upper lobe appears slightly increased in density when compared with prior exam, possibly due to differences in technique, although superimposed could have this appearance. Electronically Signed   By: Yetta Glassman M.D.   On: 08/24/2021 15:16   CT Head Wo Contrast  Result Date: 08/20/2021 CLINICAL DATA:  Polytrauma, blunt, dementia. Patient fell in the bathroom this morning. EXAM: CT HEAD WITHOUT CONTRAST TECHNIQUE: Contiguous axial images were obtained from the base of the skull through the vertex without intravenous contrast. RADIATION DOSE REDUCTION: This exam was performed according to the departmental dose-optimization program which includes automated exposure control, adjustment of the mA and/or kV according to patient size and/or use of iterative reconstruction technique. COMPARISON:  MRI examination dated March 05, 2017. FINDINGS: Brain: No evidence of acute infarction, hemorrhage, hydrocephalus, extra-axial collection or mass lesion/mass effect. Encephalomalacia of  the left frontal lobe suggesting prior infarct. There is also encephalomalacia of the bilateral cerebellum right greater than the left. Moderate cerebral volume loss and advanced chronic microvascular ischemic changes of the white matter. Vascular: No hyperdense vessel or unexpected calcification. Skull: Normal. Negative for fracture or focal lesion. Sinuses/Orbits: No acute finding. Other: None. IMPRESSION: 1. No acute intracranial abnormality. 2. Bilateral cerebellar and left frontal encephalomalacia suggesting prior infarcts. Cerebral atrophy and chronic microvascular ischemic changes  of the white matter, unchanged. Electronically Signed   By: Keane Police D.O.   On: 08/20/2021 12:23   DG Pelvis 1-2 Views  Result Date: 08/20/2021 CLINICAL DATA:  Weakness, fall.  Fell in the bathroom this morning. EXAM: PELVIS - 1-2 VIEW COMPARISON:  None Available. FINDINGS: There is no evidence of pelvic fracture or diastasis. No pelvic bone lesions are seen. Bilateral hip joint space narrowing with marginal spurring. Degenerate disc disease of the lower lumbar spine. IMPRESSION: No acute fracture or dislocation. Electronically Signed   By: Keane Police D.O.   On: 08/20/2021 12:14   DG Chest 2 View  Result Date: 08/20/2021 CLINICAL DATA:  Patient fell in the bathroom. EXAM: CHEST - 2 VIEW COMPARISON:  08/16/2017 FINDINGS: Low volume film. Cardiopericardial silhouette is at upper limits of normal for size. Mild interval progression of bibasilar collapse/consolidation, left greater than right superimposed on background diffuse interstitial coarsening. Calcified nodal tissue in the left hilum again noted. The visualized bony structures of the thorax are unremarkable. Telemetry leads overlie the chest. IMPRESSION: No substantial interval change in exam. Mild increase in bibasilar collapse/consolidation, left greater than right. Electronically Signed   By: Misty Stanley M.D.   On: 08/20/2021 12:13     Discharge Exam: Vitals:   08/25/21 1100 08/25/21 1130  BP: (!) 140/73 (!) 152/69  Pulse: 84 67  Resp: 19 14  Temp:    SpO2: 97% 97%   Vitals:   08/25/21 1000 08/25/21 1030 08/25/21 1100 08/25/21 1130  BP: (!) 165/68 (!) 149/71 (!) 140/73 (!) 152/69  Pulse: 92 76 84 67  Resp: 19 18 19 14   Temp:      TempSrc:      SpO2: 100% 100% 97% 97%  Weight:      Height:        General: Pt is alert, awake, not in acute distress Cardiovascular: RRR, S1/S2 +, no rubs, no gallops Respiratory: CTA bilaterally, no wheezing, no rhonchi Abdominal: Soft, NT, ND, bowel sounds + Extremities: no edema, no  cyanosis    The results of significant diagnostics from this hospitalization (including imaging, microbiology, ancillary and laboratory) are listed below for reference.     Microbiology: Recent Results (from the past 240 hour(s))  Urine Culture     Status: Abnormal   Collection Time: 08/20/21  4:02 PM   Specimen: Urine, Random  Result Value Ref Range Status   Specimen Description   Final    URINE, RANDOM Performed at [facility], [address] [place], [place] [address]    Special Requests   Final    NONE Performed at [facility], [address] [place], [place] [address]    Culture MULTIPLE SPECIES PRESENT, SUGGEST RECOLLECTION (A)  Final   Report Status 08/22/2021 FINAL  Final  SARS Coronavirus 2 by RT PCR (hospital order, performed in [facility] hospital lab) *cepheid single result test* Anterior Nasal Swab     Status: None   Collection Time: 08/24/21  3:27 PM   Specimen: Anterior Nasal Swab  Result Value Ref Range Status   SARS Coronavirus 2 by RT PCR NEGATIVE NEGATIVE Final    Comment: (NOTE) SARS-CoV-2 target nucleic acids are NOT DETECTED.  The SARS-CoV-2 RNA is generally detectable in upper and lower respiratory specimens during the acute phase of infection. The lowest concentration of SARS-CoV-2 viral copies this assay can detect is 250 copies / mL. A negative result does not preclude SARS-CoV-2 infection and should not be used as the sole basis for treatment or other patient management decisions.  A negative result may occur with improper specimen collection / handling, submission of specimen other than nasopharyngeal swab, presence of viral mutation(s) within the areas targeted by this assay, and inadequate number of viral copies (<250 copies / mL). A negative result must be combined with clinical observations, patient history, and epidemiological information.  Fact Sheet for Patients:   https://www.patel.info/  Fact Sheet for Healthcare  Providers: https://hall.com/  This test is not yet approved or  cleared by the [place] FDA and has been authorized for detection and/or diagnosis of SARS-CoV-2 by FDA under an Emergency Use Authorization (EUA).  This EUA will remain in effect (meaning this test can be used) for the duration of the COVID-19 declaration under Section 564(b)(1) of the Act, 21 U.S.C. section 360bbb-3(b)(1), unless the authorization is terminated or revoked sooner.  Performed at [facility], [address] [place], [place] [address]      Labs: BNP (last 3 results) Recent Labs    08/20/21 1126 08/24/21 1521  BNP 27.0 03.5   Basic Metabolic Panel: Recent Labs  Lab 08/20/21 1126 08/24/21 1521 08/25/21 0435  NA 135 137 137  K 4.2 4.3 4.5  CL 106 109 109  CO2 20* 23 23  GLUCOSE 101* 101* 95  BUN 18 28* 22  CREATININE 1.47* 1.49* 0.99  CALCIUM 8.6* 8.7* 8.5*   Liver Function Tests: Recent Labs  Lab 08/20/21 1126 08/24/21 1521  AST 28 39  ALT 13 17  ALKPHOS 74 63  BILITOT 0.7 0.7  PROT 7.6 7.5  ALBUMIN 3.7 3.1*   No results for input(s): "LIPASE", "AMYLASE" in the last 168 hours. No results for input(s): "AMMONIA" in the last 168 hours. CBC: Recent Labs  Lab 08/20/21 1126 08/24/21 1521 08/25/21 0435  WBC 7.6 6.8 7.0  NEUTROABS 4.3  --   --   HGB 13.4 12.3* 12.6*  HCT 41.7 38.4* 38.7*  MCV 96.3 94.1 94.4  PLT 157 164 163   Cardiac Enzymes: No results for input(s): "CKTOTAL", "CKMB", "CKMBINDEX", "TROPONINI" in the last 168 hours. BNP: Invalid input(s): "POCBNP" CBG: No results for input(s): "GLUCAP" in the last 168 hours. D-Dimer No results for input(s): "DDIMER" in the last 72 hours. Hgb A1c No results for input(s): "HGBA1C" in the last 72 hours. Lipid Profile No results for input(s): "CHOL", "HDL", "LDLCALC", "TRIG", "CHOLHDL", "LDLDIRECT" in the last 72 hours. Thyroid function studies No results for input(s): "TSH", "T4TOTAL",  "T3FREE", "THYROIDAB" in the last 72 hours.  Invalid input(s): "FREET3" Anemia work up No results for input(s): "VITAMINB12", "FOLATE", "FERRITIN", "TIBC", "IRON", "RETICCTPCT" in the last 72 hours. Urinalysis    Component Value Date/Time   COLORURINE YELLOW 08/24/2021 1921   APPEARANCEUR CLEAR 08/24/2021 1921   APPEARANCEUR Clear 08/12/2021 1236   LABSPEC 1.020 08/24/2021 1921   PHURINE 6.0 08/24/2021 1921   GLUCOSEU NEGATIVE 08/24/2021 1921   HGBUR NEGATIVE 08/24/2021 1921   BILIRUBINUR NEGATIVE 08/24/2021 1921   BILIRUBINUR Negative 08/12/2021 1236   [place] 08/24/2021  1921   PROTEINUR 30 (A) 08/24/2021 1921   UROBILINOGEN 0.2 04/26/2014 0930   NITRITE NEGATIVE 08/24/2021 1921   LEUKOCYTESUR NEGATIVE 08/24/2021 1921   Sepsis Labs Recent Labs  Lab 08/20/21 1126 08/24/21 1521 08/25/21 0435  WBC 7.6 6.8 7.0   Microbiology Recent Results (from the past 240 hour(s))  Urine Culture     Status: Abnormal   Collection Time: 08/20/21  4:02 PM   Specimen: Urine, Random  Result Value Ref Range Status   Specimen Description   Final    URINE, RANDOM Performed at [facility], [address], [place], [place] [address]    Special Requests   Final    NONE Performed at [facility], [address] [place], [place] [address]    Culture MULTIPLE SPECIES PRESENT, SUGGEST RECOLLECTION (A)  Final   Report Status 08/22/2021 FINAL  Final  SARS Coronavirus 2 by RT PCR (hospital order, performed in [facility] hospital lab) *cepheid single result test* Anterior Nasal Swab     Status: None   Collection Time: 08/24/21  3:27 PM   Specimen: Anterior Nasal Swab  Result Value Ref Range Status   SARS Coronavirus 2 by RT PCR NEGATIVE NEGATIVE Final    Comment: (NOTE) SARS-CoV-2 target nucleic acids are NOT DETECTED.  The SARS-CoV-2 RNA is generally detectable in upper and lower respiratory specimens during the acute phase of infection. The lowest concentration of SARS-CoV-2 viral  copies this assay can detect is 250 copies / mL. A negative result does not preclude SARS-CoV-2 infection and should not be used as the sole basis for treatment or other patient management decisions.  A negative result may occur with improper specimen collection / handling, submission of specimen other than nasopharyngeal swab, presence of viral mutation(s) within the areas targeted by this assay, and inadequate number of viral copies (<250 copies / mL). A negative result must be combined with clinical observations, patient history, and epidemiological information.  Fact Sheet for Patients:   https://www.patel.info/  Fact Sheet for Healthcare Providers: https://hall.com/  This test is not yet approved or  cleared by the [place] FDA and has been authorized for detection and/or diagnosis of SARS-CoV-2 by FDA under an Emergency Use Authorization (EUA).  This EUA will remain in effect (meaning this test can be used) for the duration of the COVID-19 declaration under Section 564(b)(1) of the Act, 21 U.S.C. section 360bbb-3(b)(1), unless the authorization is terminated or revoked sooner.  Performed at [facility], [address] [place], [place] [address]      Time coordinating discharge: 35 minutes  SIGNED:   Rodena Goldmann, DO [facility] 08/25/2021, 1:43 PM  If 7PM-7AM, please contact night-coverage [email or web address]

## 2021-08-30 ENCOUNTER — Ambulatory Visit (INDEPENDENT_AMBULATORY_CARE_PROVIDER_SITE_OTHER): Payer: Medicare Other | Admitting: Nurse Practitioner

## 2021-08-30 ENCOUNTER — Encounter: Payer: Self-pay | Admitting: Nurse Practitioner

## 2021-08-30 VITALS — BP 122/65 | HR 72 | Temp 98.7°F | Ht 69.0 in

## 2021-08-30 DIAGNOSIS — E860 Dehydration: Secondary | ICD-10-CM

## 2021-08-30 NOTE — Progress Notes (Signed)
Established Patient Office Visit  Subjective   Patient ID: [name], male    DOB: [birth year]  Age: 87 y.o. MRN: [id number]  Chief Complaint  Patient presents with   Hospitalization Follow-up    HPI  Patient presented to the emergency department with dehydration, generalized weakness, desaturation and overall malaise /fatigue.  Patient was evaluated and found to have had pneumonia of the left lower lung.  Patient was treated for dehydration with bolus fluid, Rocephin antibiotic and was placed on 2 L nasal cannula and inpatient admission. Patient was discharged after stabilization and asked to follow-up out patient. In clinic today he appears to be at his baseline, no shortness of breath, or pale skin.  Patient is more alert and is afebrile.  Patient Active Problem List   Diagnosis Date Noted   Acute respiratory failure with hypoxia ([place]) 08/24/2021   AKI (acute kidney injury) ([place]) 35/00/9381   Acute metabolic encephalopathy 82/99/3716   Anxiety 01/07/2021   Elevated PSA 10/28/2020   Urinary retention 10/25/2020   Abnormal urine odor 04/21/2020   Depression, recurrent ([place]) 04/21/2020   Cerebrovascular accident (CVA) due to embolism of left middle cerebral artery ([place]) 12/14/2018   Abnormal findings on diagnostic imaging of lung 08/19/2018   IPF (idiopathic pulmonary fibrosis) ([place]) 08/19/2018   OSA (obstructive sleep apnea) 08/19/2018   Carotid artery disease ([place]) 03/26/2017   Internal carotid artery stenosis, left 03/06/2017   Dysphagia 03/06/2017   Pulmonary nodules 03/06/2017   Seizure ([place]) 03/03/2017   Protein-calorie malnutrition ([place]) 01/10/2017   Chronic pain 10/02/2016   Depression, major, single episode, moderate ([place]) 08/31/2016   Chronic insomnia 08/31/2016   Lumbar spinal stenosis 08/18/2016   Wheelchair dependence 08/18/2016   Dementia ([place]) 08/18/2016   Urinary incontinence 08/18/2016   Polypharmacy 08/18/2016   Laryngopharyngeal reflux (LPR)  08/12/2015   Memory difficulty 08/04/2014   Numbness and tingling of right arm 08/04/2014   Abnormality of gait 08/01/2013   Anemia 12/05/2012   Early satiety 12/03/2012   History of colon polyps 12/03/2012   Facial droop 10/12/2012   Spinal stenosis of cervical region 07/01/2012   GERD 05/27/2009   DYSPHAGIA UNSPECIFIED 05/27/2009   CHANGE IN BOWELS 05/27/2009   SMOKER 09/09/2008   Essential hypertension 09/09/2008   History of stroke 09/09/2008   Constipation 09/09/2008   HIGH BLOOD PRESSURE 07/25/2006   Past Medical History:  Diagnosis Date   Abnormality of gait 08/01/2013   Arthritis    Blindness of left eye    Posttraumatic   Carotid artery disease (H[facility])    Colon polyp    Constipation    Diaphragmatic hernia    Disc disease, degenerative, cervical    Diverticulitis    ED (erectile dysfunction)    Gastroparesis    GERD (gastroesophageal reflux disease)    Glaucoma    Heart disease    Hyperlipidemia    Hypertension    Insomnia    Memory difficulty 08/04/2014   Nicotine dependence    Numbness and tingling of right arm 08/04/2014   Osteoarthritis    Overactive bladder    Radiculopathy    Shortness of breath    with activity   Sleep apnea    does not use CPAP.  Tested maybe 5- 10 years ago   Stroke [facility])    Left side weakness.   Unilateral inguinal hernia    Past Surgical History:  Procedure Laterality Date   ANTERIOR CERVICAL DECOMP/DISCECTOMY FUSION N/A 07/01/2012   Procedure: ANTERIOR CERVICAL DECOMPRESSION/DISCECTOMY  FUSION CERVICAL FIVE-SIX [name] REMOVAL CERVICAL SIX-SEVEN;  Surgeon: [name], MD;  Location: [facility];  Service: Neurosurgery;  Laterality: N/A;   CERVICAL FUSION     COLONOSCOPY   08/04/2002     RMR: Normal rectum/Pancolonic diverticula/Colonic polyps as described above, biopsied and/or snared   COLONOSCOPY  60/08/2009   RMR: normal rectum/left and right sided diverticula/multiple colonic polyps. tubular adenomas. surveillance due  2014   COLONOSCOPY WITH ESOPHAGOGASTRODUODENOSCOPY (EGD) N/A 12/18/2012   Dr. [name]:  Colonic diverticulosis. Multiple colonic polyps-hyperplastic polyps and tubular adenoma. Friable anal canal hemorrhoids. EGD with chronic atrophic gastritis   ESOPHAGOGASTRODUODENOSCOPY  04/14/2002   MWN:[id number]' ring, status post dilation as described above/Hiatal hernia, focal antral erosions of uncertain clinical significance   ESOPHAGOGASTRODUODENOSCOPY  06/09/2009   RMR: normal esophagus s/p dilator/small hiatal hernia otherwise normal   HERNIA REPAIR Right    inguinal   I & D EXTREMITY Right 04/23/2017   Procedure: [place] HAND;  Surgeon: [name], MD;  Location: [place];  Service: Orthopedics;  Laterality: Right;   INGUINAL HERNIA REPAIR Left 04/15/2014   Procedure: LEFT INGUINAL HERNIORRHAPHY WITH MESH;  Surgeon: [name], MD;  Location: [facility];  Service: General;  Laterality: Left;   INSERTION OF MESH Left 04/15/2014   Procedure: INSERTION OF MESH;  Surgeon: [name], MD;  Location: [facility];  Service: General;  Laterality: Left;   Social History   Tobacco Use   Smoking status: Every Day    Packs/day: 0.25    Years: 60.00    Total pack years: 15.00    Types: Cigarettes   Smokeless tobacco: Never   Tobacco comments:    using patch  Vaping Use   Vaping Use: Never used  Substance Use Topics   Alcohol use: No   Drug use: No   Social History   Socioeconomic History   Marital status: Widowed    Spouse name: Not on file   Number of children: 2   Years of education: hs   Highest education level: Not on file  Occupational History   Occupation: Retired    Fish farm manager: RETIRED  Tobacco Use   Smoking status: Every Day    Packs/day: 0.25    Years: 60.00    Total pack years: 15.00    Types: Cigarettes   Smokeless tobacco: Never   Tobacco comments:    using patch  Vaping Use   Vaping Use: Never used  Substance and Sexual Activity   Alcohol use: No    Drug use: No   Sexual activity: Not Currently  Other Topics Concern   Not on file  Social History Narrative   Patient drinks 1-2 cups of caffeine daily.   Patient is right handed.   Lives with his sister [name]   Has a nurse/ Caregiver, [name] Scales to help with bed baths, dressing, meals, etc 5 days per week   Social Determinants of Health   Financial Resource Strain: Low Risk  (08/31/2020)   Overall Financial Resource Strain (CARDIA)    Difficulty of Paying Living Expenses: Not very hard  Food Insecurity: No Food Insecurity (08/31/2020)   Hunger Vital Sign    Worried About Running Out of Food in the Last Year: Never true    Ran Out of Food in the Last Year: Never true  Transportation Needs: No Transportation Needs (08/31/2020)   PRAPARE - Hydrologist (Medical): No    Lack of Transportation (  Non-Medical): No  Physical Activity: Inactive (08/31/2020)   Exercise Vital Sign    Days of Exercise per Week: 0 days    Minutes of Exercise per Session: 0 min  Stress: No Stress Concern Present (08/31/2020)   [place]    Feeling of Stress : Not at all  Social Connections: Moderately Isolated (08/31/2020)   Social Connection and Isolation Panel [NHANES]    Frequency of Communication with Friends and Family: Twice a week    Frequency of Social Gatherings with Friends and Family: More than three times a week    Attends Religious Services: More than 4 times per year    Active Member of [employer] or Organizations: No    Attends Archivist Meetings: Never    Marital Status: Widowed  Intimate Partner Violence: Not At Risk (08/31/2020)   Humiliation, Afraid, Rape, and Kick questionnaire    Fear of Current or Ex-Partner: No    Emotionally Abused: No    Physically Abused: No    Sexually Abused: No   Family Status  Relation Name Status   Mother  Deceased at age 33   Father  Deceased at  age 9       unknown   Sister  Alive   Brother  Alive   Brother  Alive   Neg Hx  (Not Specified)   Family History  Problem Relation Age of Onset   Cancer Mother    Dementia Father    Colon cancer Neg Hx    No Known Allergies    Review of Systems  Constitutional: Negative.  Negative for chills, fever and malaise/fatigue.  Eyes: Negative.   Respiratory: Negative.    Cardiovascular: Negative.   Genitourinary: Negative.  Negative for dysuria.  Musculoskeletal:        Contractures hands and feet, Wheel chair dependent   Skin: Negative.  Negative for itching and rash.  Neurological: Negative.   All other systems reviewed and are negative.     Objective:     BP 122/65   Pulse 72   Temp 98.7 F (37.1 C)   Ht '5\' 9"'$  (0.350 m)   SpO2 97%   BMI 23.92 kg/m  BP Readings from Last 3 Encounters:  08/30/21 122/65  08/25/21 (!) 141/75  08/24/21 (!) 86/56   Wt Readings from Last 3 Encounters:  08/24/21 162 lb (73.5 kg)  08/20/21 162 lb 0.6 oz (73.5 kg)  08/12/21 162 lb (73.5 kg)      Physical Exam Vitals and nursing note reviewed.  Constitutional:      Appearance: Normal appearance. He is not ill-appearing or toxic-appearing.  HENT:     Head: Normocephalic.     Right Ear: External ear normal.     Left Ear: External ear normal.     Nose: Nose normal.  Eyes:     Conjunctiva/sclera: Conjunctivae normal.  Cardiovascular:     Pulses: Normal pulses.     Heart sounds: Normal heart sounds.  Pulmonary:     Effort: Pulmonary effort is normal.     Breath sounds: Normal breath sounds.  Abdominal:     General: Bowel sounds are normal.  Musculoskeletal:     Comments: Wheel chair dependant  Neurological:     Mental Status: He is alert.      No results found for any visits on 08/30/21.  Last CBC Lab Results  Component Value Date   WBC 7.0 08/25/2021   HGB 12.6 (L)  08/25/2021   HCT 38.7 (L) 08/25/2021   MCV 94.4 08/25/2021   MCH 30.7 08/25/2021   RDW 14.2  08/25/2021   PLT 163 88/50/2774   Last metabolic panel Lab Results  Component Value Date   GLUCOSE 95 08/25/2021   NA 137 08/25/2021   K 4.5 08/25/2021   CL 109 08/25/2021   CO2 23 08/25/2021   BUN 22 08/25/2021   CREATININE 0.99 08/25/2021   GFRNONAA >60 08/25/2021   CALCIUM 8.5 (L) 08/25/2021   PROT 7.5 08/24/2021   ALBUMIN 3.1 (L) 08/24/2021   LABGLOB 3.1 07/12/2021   AGRATIO 1.3 07/12/2021   BILITOT 0.7 08/24/2021   ALKPHOS 63 08/24/2021   AST 39 08/24/2021   ALT 17 08/24/2021   ANIONGAP 5 08/25/2021      The ASCVD Risk score (Arnett DK, et al., 2019) failed to calculate for the following reasons:   The 2019 ASCVD risk score is only valid for ages 54 to 59   The patient has a prior MI or stroke diagnosis    Assessment & Plan:  Assessment completed, patient is afebrile and has no new concerns.  Patient will follow-up in 6 weeks to repeat chest x-ray to rule out pneumonia. Completed hospital discharge instructions with medication reconciliation. Follow-up for worsening or unresolved symptoms.   Problem List Items Addressed This Visit   None Visit Diagnoses     Dehydration    -  Primary       Return if symptoms worsen or fail to improve.    Ivy Lynn, NP

## 2021-09-01 ENCOUNTER — Ambulatory Visit (INDEPENDENT_AMBULATORY_CARE_PROVIDER_SITE_OTHER): Payer: Medicare Other

## 2021-09-01 DIAGNOSIS — Z0000 Encounter for general adult medical examination without abnormal findings: Secondary | ICD-10-CM | POA: Diagnosis not present

## 2021-09-01 NOTE — Progress Notes (Signed)
MEDICARE ANNUAL WELLNESS VISIT  09/01/2021  Telephone Visit Disclaimer This Medicare AWV was conducted by telephone due to national recommendations for restrictions regarding the COVID-19 Pandemic (e.g. social distancing).  I verified, using two identifiers, that I am speaking with [name] or their authorized healthcare agent. I discussed the limitations, risks, security, and privacy concerns of performing an evaluation and management service by telephone and the potential availability of an in-person appointment in the future. The patient expressed understanding and agreed to proceed.  Location of Patient: Home Location of Provider (nurse):  [facility]  Subjective:    [name] is a 87 y.o. male patient of Ivy Lynn, NP who had a Medicare Annual Wellness Visit today via telephone. [name] is currently disabled and is completely dependent on his caregiver of 10 years and his family members. He was in the army right out of high school. He currently lives with his sister and his nephew. His son comes over every night and puts him to bed. He is legally blind in his left eye per caregiver.   Patient Care Team: Ivy Lynn, NP as PCP - General (Nurse Practitioner) Daneil Dolin, MD as Consulting Physician (Gastroenterology) Edythe Clarity, [facility] as Pharmacist (Pharmacist)     09/01/2021   11:42 AM 08/25/2021   10:22 AM 08/24/2021    1:15 PM 08/20/2021   11:12 AM 08/20/2019   10:41 AM 04/22/2017   10:54 PM 03/26/2017    1:33 PM  Advanced Directives  Does Patient Have a Medical Advance Directive? Yes Yes No No Yes Yes Yes  Type of [place] Power of [place];Living will  Does patient want to make changes to medical advance directive?     No - Patient declined No - Patient declined   Copy of [place] in Chart? No - copy  requested     No - copy requested Yes    Hospital Utilization Over the Past 12 Months: # of hospitalizations or ER visits: 1 # of surgeries: 0  Review of Systems    Patient reports that his overall health is worse compared to last year.  History obtained from family caregiver  Patient Reported Readings (BP, Pulse, CBG, Weight, etc) none  Pain Assessment Pain : No/denies pain     Current Medications & Allergies (verified) Allergies as of 09/01/2021   No Known Allergies      Medication List        Accurate as of September 01, 2021 12:08 PM. If you have any questions, ask your nurse or doctor.          albuterol (2.5 MG/3ML) 0.083% nebulizer solution Commonly known as: PROVENTIL Take 2.5 mg by nebulization daily as needed for wheezing or shortness of breath.   aspirin EC 325 MG tablet TAKE 1 TABLET BY MOUTH ONCE A DAY.   atorvastatin 40 MG tablet Commonly known as: LIPITOR TAKE (1) TABLET BY MOUTH AT BEDTIME. What changed: See the new instructions.   cyanocobalamin 1000 MCG tablet Commonly known as: VITAMIN B12 Take one tablet by mouth once daily   levETIRAcetam 250 MG tablet Commonly known as: KEPPRA TAKE (1) TABLET BY MOUTH EACH MORNING. What changed: See the new instructions.   LORazepam 1 MG tablet Commonly known as: ATIVAN Take 1 tablet (1 mg total) by mouth at bedtime.   lubiprostone 24 MCG capsule  Commonly known as: AMITIZA TAKE 1 CAPSULE BY MOUTH DAILY What changed: when to take this   mirabegron ER 50 MG Tb24 tablet Commonly known as: Myrbetriq Take 1 tablet (50 mg total) by mouth daily.   Nebulizer/Tubing/Mouthpiece Kit Disp one nebulizer machine, tubing set and mouthpiece kit   Nitrostat 0.4 MG SL tablet Generic drug: nitroGLYCERIN PLACE 1 TAB UNDER TONGUE EVERY 5 MIN IF NEEDED FOR CHEST PAIN. MAY USE 3 TIMES.NO RELIEF CALL 911. What changed: See the new instructions.   omeprazole 20 MG capsule Commonly known as: PRILOSEC TAKE 1  CAPSULE BY MOUTH ONCE DAILY FOR RELFUX, ESOPHAGITIS, OR STOMACH ULCERS. What changed: See the new instructions.   PARoxetine 40 MG tablet Commonly known as: PAXIL TAKE 1 TABLET BY MOUTH ONCE A DAY.   QC Vitamin B1 100 MG tablet Generic drug: thiamine TAKE 1 TABLET BY MOUTH ONCE A DAY.   traZODone 100 MG tablet Commonly known as: DESYREL TAKE (1) TABLET BY MOUTH AT BEDTIME AS NEEDED FOR SLEEP What changed: See the new instructions.        History (reviewed): Past Medical History:  Diagnosis Date   Abnormality of gait 08/01/2013   Arthritis    Blindness of left eye    Posttraumatic   Carotid artery disease (HCC)    Colon polyp    Constipation    Diaphragmatic hernia    Disc disease, degenerative, cervical    Diverticulitis    ED (erectile dysfunction)    Gastroparesis    GERD (gastroesophageal reflux disease)    Glaucoma    Heart disease    Hyperlipidemia    Hypertension    Insomnia    Memory difficulty 08/04/2014   Nicotine dependence    Numbness and tingling of right arm 08/04/2014   Osteoarthritis    Overactive bladder    Radiculopathy    Shortness of breath    with activity   Sleep apnea    does not use CPAP.  Tested maybe 5- 10 years ago   Stroke [facility])    Left side weakness.   Unilateral inguinal hernia    Past Surgical History:  Procedure Laterality Date   ANTERIOR CERVICAL DECOMP/DISCECTOMY FUSION N/A 07/01/2012   Procedure: ANTERIOR CERVICAL DECOMPRESSION/DISCECTOMY FUSION CERVICAL FIVE-SIX [name] REMOVAL CERVICAL SIX-SEVEN;  Surgeon: [name], MD;  Location: [place] NEURO ORS;  Service: Neurosurgery;  Laterality: N/A;   CERVICAL FUSION     COLONOSCOPY   08/04/2002     RMR: Normal rectum/Pancolonic diverticula/Colonic polyps as described above, biopsied and/or snared   COLONOSCOPY  60/08/2009   RMR: normal rectum/left and right sided diverticula/multiple colonic polyps. tubular adenomas. surveillance due 2014   COLONOSCOPY WITH  ESOPHAGOGASTRODUODENOSCOPY (EGD) N/A 12/18/2012   Dr. [name]:  Colonic diverticulosis. Multiple colonic polyps-hyperplastic polyps and tubular adenoma. Friable anal canal hemorrhoids. EGD with chronic atrophic gastritis   ESOPHAGOGASTRODUODENOSCOPY  04/14/2002   HBZ:JIRCVELF'Y' ring, status post dilation as described above/Hiatal hernia, focal antral erosions of uncertain clinical significance   ESOPHAGOGASTRODUODENOSCOPY  06/09/2009   RMR: normal esophagus s/p dilator/small hiatal hernia otherwise normal   HERNIA REPAIR Right    inguinal   I & D EXTREMITY Right 04/23/2017   Procedure: [place] HAND;  Surgeon: [name], MD;  Location: [place];  Service: Orthopedics;  Laterality: Right;   INGUINAL HERNIA REPAIR Left 04/15/2014   Procedure: LEFT INGUINAL HERNIORRHAPHY WITH MESH;  Surgeon: [name], MD;  Location: [facility];  Service: General;  Laterality: Left;  INSERTION OF MESH Left 04/15/2014   Procedure: INSERTION OF MESH;  Surgeon: [name], MD;  Location: [facility];  Service: General;  Laterality: Left;   Family History  Problem Relation Age of Onset   Cancer Mother    Dementia Father    Colon cancer Neg Hx    Social History   Socioeconomic History   Marital status: Widowed    Spouse name: Not on file   Number of children: 1   Years of education: hs   Highest education level: Not on file  Occupational History   Occupation: Retired    Fish farm manager: RETIRED  Tobacco Use   Smoking status: Every Day    Packs/day: 0.25    Years: 60.00    Total pack years: 15.00    Types: Cigarettes   Smokeless tobacco: Never   Tobacco comments:    using patch  Vaping Use   Vaping Use: Never used  Substance and Sexual Activity   Alcohol use: No   Drug use: No   Sexual activity: Not Currently  Other Topics Concern   Not on file  Social History Narrative   Patient drinks 1-2 cups of caffeine daily.   Patient is right handed.   Lives with his sister [name]   Has a nurse/ Caregiver, [name] Scales to help with bed baths, dressing, meals, etc 5 days per week   Social Determinants of Health   Financial Resource Strain: Low Risk  (08/31/2020)   Overall Financial Resource Strain (CARDIA)    Difficulty of Paying Living Expenses: Not very hard  Food Insecurity: No Food Insecurity (08/31/2020)   Hunger Vital Sign    Worried About Running Out of Food in the Last Year: Never true    Ran Out of Food in the Last Year: Never true  Transportation Needs: No Transportation Needs (08/31/2020)   PRAPARE - Hydrologist (Medical): No    Lack of Transportation (Non-Medical): No  Physical Activity: Inactive (08/31/2020)   Exercise Vital Sign    Days of Exercise per Week: 0 days    Minutes of Exercise per Session: 0 min  Stress: No Stress Concern Present (08/31/2020)   [place]    Feeling of Stress : Not at all  Social Connections: Moderately Isolated (08/31/2020)   Social Connection and Isolation Panel [NHANES]    Frequency of Communication with Friends and Family: Twice a week    Frequency of Social Gatherings with Friends and Family: More than three times a week    Attends Religious Services: More than 4 times per year    Active Member of [employer] or Organizations: No    Attends Archivist Meetings: Never    Marital Status: Widowed    Activities of Daily Living    09/01/2021   11:43 AM 08/25/2021   10:22 AM  In your present state of health, do you have any difficulty performing the following activities:  Hearing? 0 0  Vision? 1 1  Comment  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  Difficulty concentrating or making decisions? 1 1  Walking or climbing stairs? 1 1  Dressing or bathing? 1 1  Doing errands, shopping? 1 1  Preparing Food and eating ? Y   Using the Toilet? Y   In the past six months, have you accidently leaked urine? Y   Do you have problems  with loss of bowel control? N  Managing your Medications? Y   Managing your Finances? Y   Housekeeping or managing your Housekeeping? Y     Patient Education/ Literacy How often do you need to have someone help you when you read instructions, pamphlets, or other written materials from your doctor or pharmacy?: 3 - Sometimes What is the last grade level you completed in school?: 12th grade  Exercise Current Exercise Habits: The patient does not participate in regular exercise at present, Exercise limited by: orthopedic condition(s);cardiac condition(s)  Diet Patient reports consuming 3 meals a day and 1 snack(s) a day Patient reports that his primary diet is: Regular Patient reports that she does have regular access to food.   Depression Screen    08/24/2021   11:37 AM 01/07/2021   11:22 AM 10/25/2020   11:28 AM 08/31/2020   11:46 AM 07/23/2020   11:10 AM 04/21/2020   11:11 AM 03/31/2020   10:32 AM  PHQ 2/9 Scores  PHQ - 2 Score 2 2 0 _0 PHQ- 9 Score _1 Exception Documentation       Patient refusal     Fall Risk    09/01/2021   11:42 AM 08/24/2021   11:37 AM 07/12/2021   10:38 AM 01/07/2021   11:22 AM 10/25/2020   11:27 AM  Fall Risk   Falls in the past year? 1 0 0 Exclusion - non ambulatory 0  Number falls in past yr: 1      Injury with Fall? 0  0    Risk for fall due to : History of fall(s)      Follow up Education provided         Objective:  [name] seemed alert and oriented and he participated appropriately during our telephone visit.  Blood Pressure Weight BMI  BP Readings from Last 3 Encounters:  08/30/21 122/65  08/25/21 (!) 141/75  08/24/21 (!) 86/56   Wt Readings from Last 3 Encounters:  08/24/21 162 lb (73.5 kg)  08/20/21 162 lb 0.6 oz (73.5 kg)  08/12/21 162 lb (73.5 kg)   BMI Readings from Last 1 Encounters:  08/30/21 23.92 kg/m    *Unable to obtain current vital signs, weight, and BMI due to telephone visit type  Hearing/Vision   Marlyn did seem to have difficulty with hearing/understanding during the telephone conversation Reports that he has not had a formal eye exam by an eye care professional within the past year Reports that he has not had a formal hearing evaluation within the past year *Unable to fully assess hearing and vision during telephone visit type  Cognitive Function:     No data to display         (Normal:0-7, Significant for Dysfunction: >8)  Normal Cognitive Function Screening: No: Unable to complete cognitive exam   Immunization & Health Maintenance Record Immunization History  Administered Date(s) Administered   Fluad Quad(high Dose 65+) 10/08/2018, 11/11/2019, 10/25/2020   Influenza, High Dose Seasonal PF 09/18/2017   Influenza,inj,Quad PF,6+ Mos 10/02/2016   Moderna Sars-Covid-2 Vaccination 03/27/2019, 04/18/2019, 12/10/2019, 06/02/2020   Pneumococcal Conjugate-13 02/02/2017   Pneumococcal Polysaccharide-23 11/14/1995, 08/20/2019   Tdap 04/22/2017, 04/25/2017   Zoster Recombinat (Shingrix) 04/11/2021, 07/12/2021    Health Maintenance  Topic Date Due   INFLUENZA VACCINE  08/02/2021   COVID-19 Vaccine (5 - Moderna risk series) 09/17/2021 (Originally 07/28/2020)   TETANUS/TDAP  04/26/2027   Pneumonia Vaccine 22+ Years old  Completed   Zoster Vaccines-  Shingrix  Completed   HPV VACCINES  Aged Out       Assessment  This is a routine wellness examination for [name].  Health Maintenance: Due or Overdue Health Maintenance Due  Topic Date Due   INFLUENZA VACCINE  08/02/2021    [name] does not need a referral for Community Assistance: Care Management:   no Social Work:    no Prescription Assistance:  no Nutrition/Diabetes Education:  no   Plan:  Personalized Goals  Goals Addressed   None    Personalized Health Maintenance & Screening Recommendations  Influenza vaccine  Lung Cancer Screening Recommended: no (Low Dose CT Chest recommended if Age 39-80 years,  30 pack-year currently smoking OR have quit w/in past 15 years) Hepatitis C Screening recommended: not applicable HIV Screening recommended: no  Advanced Directives: Written information was not prepared per patient's request.  Referrals & Orders No orders of the defined types were placed in this encounter.   Follow-up Plan Follow-up with Ivy Lynn, NP as planned Schedule for seasonal flu shot     I have personally reviewed and noted the following in the patient's chart:   Medical and social history Use of alcohol, tobacco or illicit drugs  Current medications and supplements Functional ability and status Nutritional status Physical activity Advanced directives List of other physicians Hospitalizations, surgeries, and ER visits in previous 12 months Vitals Screenings to include cognitive, depression, and falls Referrals and appointments  In addition, I have reviewed and discussed with [name] certain preventive protocols, quality metrics, and best practice recommendations. A written personalized care plan for preventive services as well as general preventive health recommendations is available and can be mailed to the patient at his request.      Rolena Infante LPN [id number]

## 2021-09-14 ENCOUNTER — Encounter: Payer: Self-pay | Admitting: Urology

## 2021-09-14 ENCOUNTER — Ambulatory Visit (INDEPENDENT_AMBULATORY_CARE_PROVIDER_SITE_OTHER): Payer: Medicare Other | Admitting: Urology

## 2021-09-14 VITALS — BP 129/74 | HR 73 | Ht 69.0 in | Wt 162.0 lb

## 2021-09-14 DIAGNOSIS — R972 Elevated prostate specific antigen [PSA]: Secondary | ICD-10-CM

## 2021-09-14 DIAGNOSIS — N3944 Nocturnal enuresis: Secondary | ICD-10-CM

## 2021-09-14 NOTE — Progress Notes (Signed)
Assessment: 1. Nocturnal enuresis   2. Elevated PSA      Plan: His urinary incontinence is likely multifactorial given his nonambulatory state, decreased fluid intake throughout the day, history of stroke, and dementia. Recommend increasing daytime fluid intake Discontinue Myrbetriq due to lack of efficacy. Recommend observation at this time. Given his age and comorbidities, would not recommend any evaluation of his elevated PSA at the present time. Return to office prn  Chief Complaint:  Chief Complaint  Patient presents with   Urinary Incontinence    History of Present Illness:  [name] is a 87 y.o. year old male who is seen for continued evaluation of elevated PSA and urinary incontinence.  At his visit in 11/22, he reported nighttime incontinence which was gradually worsening.  He was voiding 1-2 times per day.  No significant fluid intake throughout the day.  He was wearing adult diapers and soaking these at night.  No dysuria or gross hematuria.  He did not feel like he emptied his bladder completely.  He was on Myrbetriq 25 mg.   He is nonambulatory due to significant lower extremity weakness.  He voids into a urinal. PVR = 3 mL. DRE demonstrated a 40 g prostate without tenderness or nodularity.  PSA from 10/26/2020 was 7.4.  PSA from 07/13/2021 was 9.7 with 15.8% free  At his visit in 8/23, he reported having difficulty emptying his bladder during the day.  He was voiding twice during the day.  He continued to have incontinence at night requiring use of adult diapers.  He continued on Myrbetriq 25 mg daily.  He was only drinking slightly more than 10 ounces per day.  No dysuria or gross hematuria. PVR = 0 ml His exam was consistent with prostatitis.  He was treated with Bactrim x14 days.  His dose of Myrbetriq was increased to 50 mg nightly.  He returns today for scheduled follow-up.  He continues to have nocturnal enuresis.  He has not increased his daytime fluid  intake and is not voiding throughout the day.  No change in his symptoms with increase in Myrbetriq to 50 mg.  He had a reaction to sulfa and was unable to complete the course. He was seen in the emergency room on 08/24/2021.  Urinalysis was unremarkable.  Creatinine slightly elevated at 1.49.  Portions of the above documentation were copied from a prior visit for review purposes only.   Past Medical History:  Past Medical History:  Diagnosis Date   Abnormality of gait 08/01/2013   Arthritis    Blindness of left eye    Posttraumatic   Carotid artery disease (HCC)    Colon polyp    Constipation    Diaphragmatic hernia    Disc disease, degenerative, cervical    Diverticulitis    ED (erectile dysfunction)    Gastroparesis    GERD (gastroesophageal reflux disease)    Glaucoma    Heart disease    Hyperlipidemia    Hypertension    Insomnia    Memory difficulty 08/04/2014   Nicotine dependence    Numbness and tingling of right arm 08/04/2014   Osteoarthritis    Overactive bladder    Radiculopathy    Shortness of breath    with activity   Sleep apnea    does not use CPAP.  Tested maybe 5- 10 years ago   Stroke [facility])    Left side weakness.   Unilateral inguinal hernia     Past Surgical History:  Past Surgical History:  Procedure Laterality Date   ANTERIOR CERVICAL DECOMP/DISCECTOMY FUSION N/A 07/01/2012   Procedure: ANTERIOR CERVICAL DECOMPRESSION/DISCECTOMY FUSION CERVICAL FIVE-SIX [name] REMOVAL CERVICAL SIX-SEVEN;  Surgeon: [name], MD;  Location: [place] NEURO ORS;  Service: Neurosurgery;  Laterality: N/A;   CERVICAL FUSION     COLONOSCOPY   08/04/2002     RMR: Normal rectum/Pancolonic diverticula/Colonic polyps as described above, biopsied and/or snared   COLONOSCOPY  60/08/2009   RMR: normal rectum/left and right sided diverticula/multiple colonic polyps. tubular adenomas. surveillance due 2014   COLONOSCOPY WITH ESOPHAGOGASTRODUODENOSCOPY (EGD) N/A 12/18/2012   Dr.  [name]:  Colonic diverticulosis. Multiple colonic polyps-hyperplastic polyps and tubular adenoma. Friable anal canal hemorrhoids. EGD with chronic atrophic gastritis   ESOPHAGOGASTRODUODENOSCOPY  04/14/2002   MVH:[id number]'M' ring, status post dilation as described above/Hiatal hernia, focal antral erosions of uncertain clinical significance   ESOPHAGOGASTRODUODENOSCOPY  06/09/2009   RMR: normal esophagus s/p dilator/small hiatal hernia otherwise normal   HERNIA REPAIR Right    inguinal   I & D EXTREMITY Right 04/23/2017   Procedure: [place] HAND;  Surgeon: [name], MD;  Location: [place];  Service: Orthopedics;  Laterality: Right;   INGUINAL HERNIA REPAIR Left 04/15/2014   Procedure: LEFT INGUINAL HERNIORRHAPHY WITH MESH;  Surgeon: [name], MD;  Location: [facility];  Service: General;  Laterality: Left;   INSERTION OF MESH Left 04/15/2014   Procedure: INSERTION OF MESH;  Surgeon: [name], MD;  Location: [facility];  Service: General;  Laterality: Left;    Allergies:  Allergies  Allergen Reactions   Sulfa Antibiotics     Family History:  Family History  Problem Relation Age of Onset   Cancer Mother    Dementia Father    Colon cancer Neg Hx     Social History:  Social History   Tobacco Use   Smoking status: Every Day    Packs/day: 0.25    Years: 60.00    Total pack years: 15.00    Types: Cigarettes   Smokeless tobacco: Never   Tobacco comments:    using patch  Vaping Use   Vaping Use: Never used  Substance Use Topics   Alcohol use: No   Drug use: No    ROS: Constitutional:  Negative for fever, chills, weight loss CV: Negative for chest pain, previous MI, hypertension Respiratory:  Negative for shortness of breath, wheezing, sleep apnea, frequent cough GI:  Negative for nausea, vomiting, bloody stool, GERD  Physical exam: BP 129/74   Pulse 73   Ht '5\' 9"'$  (1.753 m)   Wt 162 lb (73.5 kg)   BMI 23.92 kg/m  GENERAL APPEARANCE:  Well  appearing, well developed, well nourished, NAD HEENT:  Atraumatic, normocephalic, oropharynx clear NECK:  Supple without lymphadenopathy or thyromegaly ABDOMEN:  Soft, non-tender, no masses EXTREMITIES:  Without clubbing, cyanosis, or edema NEUROLOGIC:  Alert and oriented x 3, in wheelchair, CN II-XII grossly intact MENTAL STATUS:  appropriate BACK:  Non-tender to palpation, No CVAT SKIN:  Warm, dry, and intact   Results: No specimen provided

## 2021-09-27 DIAGNOSIS — I739 Peripheral vascular disease, unspecified: Secondary | ICD-10-CM | POA: Diagnosis not present

## 2021-09-27 DIAGNOSIS — B351 Tinea unguium: Secondary | ICD-10-CM | POA: Diagnosis not present

## 2021-09-27 DIAGNOSIS — L110 Acquired keratosis follicularis: Secondary | ICD-10-CM | POA: Diagnosis not present

## 2021-09-28 ENCOUNTER — Observation Stay (HOSPITAL_COMMUNITY): Payer: Medicare Other

## 2021-09-28 ENCOUNTER — Observation Stay (HOSPITAL_COMMUNITY)
Admission: EM | Admit: 2021-09-28 | Discharge: 2021-09-30 | Disposition: A | Discharge status: Home / Self Care | Payer: Medicare Other | Attending: Internal Medicine | Admitting: Internal Medicine

## 2021-09-28 ENCOUNTER — Other Ambulatory Visit: Payer: Self-pay

## 2021-09-28 ENCOUNTER — Encounter (HOSPITAL_COMMUNITY): Payer: Self-pay

## 2021-09-28 ENCOUNTER — Emergency Department (HOSPITAL_COMMUNITY): Payer: Medicare Other

## 2021-09-28 DIAGNOSIS — F17210 Nicotine dependence, cigarettes, uncomplicated: Secondary | ICD-10-CM | POA: Diagnosis not present

## 2021-09-28 DIAGNOSIS — R569 Unspecified convulsions: Secondary | ICD-10-CM

## 2021-09-28 DIAGNOSIS — M6281 Muscle weakness (generalized): Secondary | ICD-10-CM | POA: Diagnosis not present

## 2021-09-28 DIAGNOSIS — W19XXXA Unspecified fall, initial encounter: Secondary | ICD-10-CM | POA: Diagnosis not present

## 2021-09-28 DIAGNOSIS — R55 Syncope and collapse: Secondary | ICD-10-CM | POA: Diagnosis not present

## 2021-09-28 DIAGNOSIS — I63412 Cerebral infarction due to embolism of left middle cerebral artery: Secondary | ICD-10-CM | POA: Diagnosis present

## 2021-09-28 DIAGNOSIS — Z7982 Long term (current) use of aspirin: Secondary | ICD-10-CM | POA: Insufficient documentation

## 2021-09-28 DIAGNOSIS — R2689 Other abnormalities of gait and mobility: Secondary | ICD-10-CM | POA: Insufficient documentation

## 2021-09-28 DIAGNOSIS — F0390 Unspecified dementia without behavioral disturbance: Secondary | ICD-10-CM | POA: Diagnosis not present

## 2021-09-28 DIAGNOSIS — I2510 Atherosclerotic heart disease of native coronary artery without angina pectoris: Secondary | ICD-10-CM | POA: Insufficient documentation

## 2021-09-28 DIAGNOSIS — Z79899 Other long term (current) drug therapy: Secondary | ICD-10-CM | POA: Diagnosis not present

## 2021-09-28 DIAGNOSIS — R4781 Slurred speech: Secondary | ICD-10-CM | POA: Diagnosis not present

## 2021-09-28 DIAGNOSIS — I10 Essential (primary) hypertension: Secondary | ICD-10-CM | POA: Insufficient documentation

## 2021-09-28 DIAGNOSIS — Z8673 Personal history of transient ischemic attack (TIA), and cerebral infarction without residual deficits: Secondary | ICD-10-CM | POA: Insufficient documentation

## 2021-09-28 DIAGNOSIS — J84112 Idiopathic pulmonary fibrosis: Secondary | ICD-10-CM | POA: Diagnosis present

## 2021-09-28 LAB — BASIC METABOLIC PANEL
Anion gap: 7 (ref 5–15)
BUN: 14 mg/dL (ref 8–23)
CO2: 25 mmol/L (ref 22–32)
Calcium: 8.8 mg/dL — ABNORMAL LOW (ref 8.9–10.3)
Chloride: 107 mmol/L (ref 98–111)
Creatinine, Ser: 0.88 mg/dL (ref 0.61–1.24)
GFR, Estimated: >60 mL/min (ref >60)
Glucose, Bld: 106 mg/dL — ABNORMAL HIGH (ref 70–99)
Potassium: 4.3 mmol/L (ref 3.5–5.1)
Sodium: 139 mmol/L (ref 135–145)

## 2021-09-28 LAB — CBC WITH DIFFERENTIAL/PLATELET
Abs Immature Granulocytes: 0.03 10*3/uL (ref 0.00–0.07)
Basophils Absolute: 0.1 10*3/uL (ref 0.0–0.1)
Basophils Relative: 1 %
Eosinophils Absolute: 0.4 10*3/uL (ref 0.0–0.5)
Eosinophils Relative: 5 %
HCT: 40.9 % (ref 39.0–52.0)
Hemoglobin: 13.2 g/dL (ref 13.0–17.0)
Immature Granulocytes: 0 %
Lymphocytes Relative: 17 %
Lymphs Abs: 1.4 10*3/uL (ref 0.7–4.0)
MCH: 30.8 pg (ref 26.0–34.0)
MCHC: 32.3 g/dL (ref 30.0–36.0)
MCV: 95.3 fL (ref 80.0–100.0)
Monocytes Absolute: 0.5 10*3/uL (ref 0.1–1.0)
Monocytes Relative: 6 %
Neutro Abs: 5.7 10*3/uL (ref 1.7–7.7)
Neutrophils Relative %: 71 %
Platelets: 220 10*3/uL (ref 150–400)
RBC: 4.29 MIL/uL (ref 4.22–5.81)
RDW: 14.4 % (ref 11.5–15.5)
WBC: 8.1 10*3/uL (ref 4.0–10.5)
nRBC: 0 % (ref 0.0–0.2)

## 2021-09-28 LAB — TROPONIN I (HIGH SENSITIVITY): Troponin I (High Sensitivity): 3 ng/L (ref <18)

## 2021-09-28 MED ORDER — LUBIPROSTONE 24 MCG PO CAPS
24.0000 ug | ORAL_CAPSULE | Freq: Every day | ORAL | Status: DC
  Administered 2021-09-29 – 2021-09-30 (×2): 24 ug via ORAL
  Filled 2021-09-28 (×2): qty 1

## 2021-09-28 MED ORDER — ONDANSETRON HCL 4 MG PO TABS: 4.0000 mg | ORAL_TABLET | Freq: Four times a day (QID) | ORAL | Status: DC | PRN

## 2021-09-28 MED ORDER — ENOXAPARIN SODIUM 40 MG/0.4ML IJ SOSY
40.0000 mg | PREFILLED_SYRINGE | INTRAMUSCULAR | Status: DC
  Administered 2021-09-28 – 2021-09-30 (×3): 40 mg via SUBCUTANEOUS
  Filled 2021-09-28 (×3): qty 0.4

## 2021-09-28 MED ORDER — LEVETIRACETAM 250 MG PO TABS
250.0000 mg | ORAL_TABLET | Freq: Every day | ORAL | Status: DC
  Administered 2021-09-29 – 2021-09-30 (×2): 250 mg via ORAL
  Filled 2021-09-28 (×2): qty 1

## 2021-09-28 MED ORDER — ACETAMINOPHEN 650 MG RE SUPP: 650.0000 mg | Freq: Four times a day (QID) | RECTAL | Status: DC | PRN

## 2021-09-28 MED ORDER — ALBUTEROL SULFATE (2.5 MG/3ML) 0.083% IN NEBU: 2.5000 mg | INHALATION_SOLUTION | Freq: Every day | RESPIRATORY_TRACT | Status: DC | PRN

## 2021-09-28 MED ORDER — ASPIRIN 325 MG PO TBEC
325.0000 mg | DELAYED_RELEASE_TABLET | Freq: Every day | ORAL | Status: DC
  Administered 2021-09-29 – 2021-09-30 (×2): 325 mg via ORAL
  Filled 2021-09-28 (×2): qty 1

## 2021-09-28 MED ORDER — POLYETHYLENE GLYCOL 3350 17 G PO PACK: 17.0000 g | PACK | Freq: Every day | ORAL | Status: DC | PRN

## 2021-09-28 MED ORDER — ATORVASTATIN CALCIUM 40 MG PO TABS
40.0000 mg | ORAL_TABLET | Freq: Every day | ORAL | Status: DC
  Administered 2021-09-28 – 2021-09-30 (×3): 40 mg via ORAL
  Filled 2021-09-28 (×3): qty 1

## 2021-09-28 MED ORDER — NYSTATIN 100000 UNIT/GM EX POWD
Freq: Three times a day (TID) | CUTANEOUS | Status: DC
  Filled 2021-09-28: qty 15

## 2021-09-28 MED ORDER — ONDANSETRON HCL 4 MG/2ML IJ SOLN: 4.0000 mg | Freq: Four times a day (QID) | INTRAMUSCULAR | Status: DC | PRN

## 2021-09-28 MED ORDER — ACETAMINOPHEN 325 MG PO TABS: 650.0000 mg | ORAL_TABLET | Freq: Four times a day (QID) | ORAL | Status: DC | PRN

## 2021-09-28 MED ORDER — SODIUM CHLORIDE 0.9 % IV SOLN: INTRAVENOUS | Status: AC

## 2021-09-28 NOTE — Assessment & Plan Note (Addendum)
Vitals stable, question vasovagal episode as he was trying to have a bowel movement.  EKG with old first-degree AV block otherwise unremarkable.  Head CT, CT neck- negative for acute abnormality.  No seizure activity. -Troponin x2 -Obtain chest x-ray -Orthostatic vitals -Echocardiogram - N/s 75cc/hr x 12hrs

## 2021-09-28 NOTE — H&P (Signed)
History and Physical    Damian Buckles ZRA:[id number] DOB: 10-11-1934 DOA: 09/28/2021  PCP: Ivy Lynn, NP   Patient coming from: [facility]  I have personally briefly reviewed patient's old medical records in [place]  Chief Complaint: Syncope  HPI: [name] is a 87 y.o. male with medical history significant for  HTN, Dementia, Pulmonary fibrosis, CVA, seizure, OSA. Patient was brought to the ED via EMS reports that he passed out while on the toilet.  On my evaluation, patient is awake alert and oriented to person and place, able to answer simple questions, but he is not able to give me details due to baseline dementia, comprehension deficits from prior stroke. Denies chest pain, no difficulty breathing, no vomiting no loose stools.  I talked to patient's sister - Barnetta Chapel on the Phone, Patient Lives with Her.  Patient Has an Aide who was with Him Today.  The sister tells me that the aide reported that patient did not pass out, he acted like he was about to pass out.  He was trying to have a bowel movement on the commode when this happened.  Prior to this he was in his normal state of health.  No seizure activity.  No recent seizures.  He has had stable oral intake, no vomiting no loose stools.  ED Course: Heart rate 52-78.  Temperature 97.6.  Blood pressure 130s to 150s.  O2 sats greater than 97% on room air. Unremarkable CBC BMP.  Head CT cervical CT without acute abnormality shows stable findings of atrophy, ventriculomegaly and remote cerebellar infarct. Hospitalist to admit for syncope.  Review of Systems: As per HPI all other systems reviewed and negative.  Past Medical History:  Diagnosis Date   Abnormality of gait 08/01/2013   Arthritis    Blindness of left eye    Posttraumatic   Carotid artery disease (HCC)    Colon polyp    Constipation    Diaphragmatic hernia    Disc disease, degenerative, cervical    Diverticulitis    ED (erectile dysfunction)     Gastroparesis    GERD (gastroesophageal reflux disease)    Glaucoma    Heart disease    Hyperlipidemia    Hypertension    Insomnia    Memory difficulty 08/04/2014   Nicotine dependence    Numbness and tingling of right arm 08/04/2014   Osteoarthritis    Overactive bladder    Radiculopathy    Shortness of breath    with activity   Sleep apnea    does not use CPAP.  Tested maybe 5- 10 years ago   Stroke [facility])    Left side weakness.   Unilateral inguinal hernia     Past Surgical History:  Procedure Laterality Date   ANTERIOR CERVICAL DECOMP/DISCECTOMY FUSION N/A 07/01/2012   Procedure: ANTERIOR CERVICAL DECOMPRESSION/DISCECTOMY FUSION CERVICAL FIVE-SIX [name] REMOVAL CERVICAL SIX-SEVEN;  Surgeon: [name], MD;  Location: [place] [facility];  Service: Neurosurgery;  Laterality: N/A;   CERVICAL FUSION     COLONOSCOPY   08/04/2002     RMR: Normal rectum/Pancolonic diverticula/Colonic polyps as described above, biopsied and/or snared   COLONOSCOPY  60/08/2009   RMR: normal rectum/left and right sided diverticula/multiple colonic polyps. tubular adenomas. surveillance due 2014   COLONOSCOPY WITH ESOPHAGOGASTRODUODENOSCOPY (EGD) N/A 12/18/2012   Dr. [name]:  Colonic diverticulosis. Multiple colonic polyps-hyperplastic polyps and tubular adenoma. Friable anal canal hemorrhoids. EGD with chronic atrophic gastritis   ESOPHAGOGASTRODUODENOSCOPY  04/14/2002   LKT:[id number]'T' ring,  status post dilation as described above/Hiatal hernia, focal antral erosions of uncertain clinical significance   ESOPHAGOGASTRODUODENOSCOPY  06/09/2009   RMR: normal esophagus s/p dilator/small hiatal hernia otherwise normal   HERNIA REPAIR Right    inguinal   I & D EXTREMITY Right 04/23/2017   Procedure: [place] HAND;  Surgeon: [name], MD;  Location: [place];  Service: Orthopedics;  Laterality: Right;   INGUINAL HERNIA REPAIR Left 04/15/2014   Procedure: LEFT INGUINAL HERNIORRHAPHY WITH  MESH;  Surgeon: [name], MD;  Location: [facility];  Service: General;  Laterality: Left;   INSERTION OF MESH Left 04/15/2014   Procedure: INSERTION OF MESH;  Surgeon: [name], MD;  Location: [facility];  Service: General;  Laterality: Left;     reports that he has been smoking cigarettes. He has a 15.00 pack-year smoking history. He has never used smokeless tobacco. He reports that he does not drink alcohol and does not use drugs.  Allergies  Allergen Reactions   Sulfa Antibiotics     Family History  Problem Relation Age of Onset   Cancer Mother    Dementia Father    Colon cancer Neg Hx     Prior to Admission medications   Medication Sig Start Date End Date Taking? Authorizing Provider  albuterol (PROVENTIL) (2.5 MG/3ML) 0.083% nebulizer solution Take 2.5 mg by nebulization daily as needed for wheezing or shortness of breath. 08/24/21   [provider]  aspirin EC 325 MG tablet TAKE 1 TABLET BY MOUTH ONCE A DAY. Patient taking differently: Take 325 mg by mouth daily. 08/23/21   Ivy Lynn, NP  atorvastatin (LIPITOR) 40 MG tablet TAKE (1) TABLET BY MOUTH AT BEDTIME. Patient taking differently: Take 40 mg by mouth daily. 08/25/21   Ivy Lynn, NP  levETIRAcetam (KEPPRA) 250 MG tablet TAKE (1) TABLET BY MOUTH EACH MORNING. Patient taking differently: Take 250 mg by mouth daily. 08/23/21   Ivy Lynn, NP  LORazepam (ATIVAN) 1 MG tablet Take 1 tablet (1 mg total) by mouth at bedtime. 05/11/21   Ivy Lynn, NP  lubiprostone (AMITIZA) 24 MCG capsule TAKE 1 CAPSULE BY MOUTH DAILY Patient taking differently: Take 24 mcg by mouth daily with breakfast. 08/23/21   Ivy Lynn, NP  NITROSTAT 0.4 MG SL tablet PLACE 1 TAB UNDER TONGUE EVERY 5 MIN IF NEEDED FOR CHEST PAIN. MAY USE 3 TIMES.NO RELIEF CALL 911. Patient taking differently: Place 0.4 mg under the tongue every 5 (five) minutes as needed for chest pain. 12/18/17   Alycia Rossetti, MD  omeprazole  (PRILOSEC) 20 MG capsule TAKE 1 CAPSULE BY MOUTH ONCE DAILY FOR RELFUX, ESOPHAGITIS, OR STOMACH ULCERS. Patient taking differently: Take 20 mg by mouth daily. 08/23/21   Ivy Lynn, NP  PARoxetine (PAXIL) 40 MG tablet TAKE 1 TABLET BY MOUTH ONCE A DAY. Patient taking differently: Take 40 mg by mouth daily. 08/23/21   Ivy Lynn, NP  QC VITAMIN B1 100 MG tablet TAKE 1 TABLET BY MOUTH ONCE A DAY. 08/23/21   Ivy Lynn, NP  Respiratory Therapy Supplies (NEBULIZER/TUBING/MOUTHPIECE) KIT Disp one nebulizer machine, tubing set and mouthpiece kit 08/17/17   Delsa Grana, PA-C  traZODone (DESYREL) 100 MG tablet TAKE (1) TABLET BY MOUTH AT BEDTIME AS NEEDED FOR SLEEP Patient taking differently: Take 100 mg by mouth at bedtime as needed for sleep. 08/23/21   Ivy Lynn, NP  vitamin B-12 (CYANOCOBALAMIN) 1000 MCG tablet Take one  tablet by mouth once daily Patient taking differently: Take 1,000 mcg by mouth daily. 02/17/21   Ivy Lynn, NP    Physical Exam: Vitals:   09/28/21 1430 09/28/21 1500 09/28/21 1530 09/28/21 1600  BP: (!) 149/61 (!) 159/78 (!) 153/75 (!) 153/84  Pulse: (!) 52 71 65 70  Resp: 16 19 17 14   Temp:      TempSrc:      SpO2: 98% 97% 98% 98%  Weight:      Height:        Constitutional: NAD, calm, comfortable Vitals:   09/28/21 1430 09/28/21 1500 09/28/21 1530 09/28/21 1600  BP: (!) 149/61 (!) 159/78 (!) 153/75 (!) 153/84  Pulse: (!) 52 71 65 70  Resp: 16 19 17 14   Temp:      TempSrc:      SpO2: 98% 97% 98% 98%  Weight:      Height:       Eyes: PERRL, lids and conjunctivae normal ENMT: Mucous membranes are moist.  Poor dentition. Neck: normal, supple, no masses, no thyromegaly Respiratory: clear to auscultation bilaterally, no wheezing, no crackles.  Cardiovascular: Regular rate and rhythm, no murmurs / rubs / gallops. No extremity edema.  Abdomen: no tenderness, no masses palpated. No hepatosplenomegaly. Bowel sounds positive.  Musculoskeletal:  no clubbing / cyanosis. No joint deformity upper and lower extremities. Skin: no rashes, lesions, ulcers. No induration Neurologic: 4+/5 strength in all extremities.  Speech slightly slurred. Psychiatric: Limited due to baseline dementia and compression deficits, awake alert oriented to person and place.  Labs on Admission: I have personally reviewed following labs and imaging studies  CBC: Recent Labs  Lab 09/28/21 1507  WBC 8.1  NEUTROABS 5.7  HGB 13.2  HCT 40.9  MCV 95.3  PLT 846   Basic Metabolic Panel: Recent Labs  Lab 09/28/21 1507  NA 139  K 4.3  CL 107  CO2 25  GLUCOSE 106*  BUN 14  CREATININE 0.88  CALCIUM 8.8*   Urine analysis:    Component Value Date/Time   COLORURINE YELLOW 08/24/2021 1921   APPEARANCEUR CLEAR 08/24/2021 1921   APPEARANCEUR Clear 08/12/2021 1236   LABSPEC 1.020 08/24/2021 1921   PHURINE 6.0 08/24/2021 1921   GLUCOSEU NEGATIVE 08/24/2021 1921   HGBUR NEGATIVE 08/24/2021 1921   BILIRUBINUR NEGATIVE 08/24/2021 1921   BILIRUBINUR Negative 08/12/2021 1236   KETONESUR NEGATIVE 08/24/2021 1921   PROTEINUR 30 (A) 08/24/2021 1921   UROBILINOGEN 0.2 04/26/2014 0930   NITRITE NEGATIVE 08/24/2021 1921   LEUKOCYTESUR NEGATIVE 08/24/2021 1921    Radiological Exams on Admission: CT Head Wo Contrast  Result Date: 09/28/2021 CLINICAL DATA:  Syncopal episode. EXAM: CT HEAD WITHOUT CONTRAST CT CERVICAL SPINE WITHOUT CONTRAST TECHNIQUE: Multidetector CT imaging of the head and cervical spine was performed following the standard protocol without intravenous contrast. Multiplanar CT image reconstructions of the cervical spine were also generated. RADIATION DOSE REDUCTION: This exam was performed according to the departmental dose-optimization program which includes automated exposure control, adjustment of the mA and/or kV according to patient size and/or use of iterative reconstruction technique. COMPARISON:  08/20/2021 FINDINGS: CT HEAD FINDINGS Brain:  Stable age related cerebral atrophy, ventriculomegaly and periventricular white matter disease. No extra-axial fluid collections are identified. No CT findings for acute hemispheric infarction or intracranial hemorrhage. No mass lesions. Stable cervical lower atrophy and evidence of remote right-sided cerebellar infarct with encephalomalacia. Vascular: Stable vascular calcifications. No aneurysm or hyperdense vessels. Skull: No skull fracture or bone lesions. Sinuses/Orbits: The  paranasal sinuses and mastoid air cells are clear. The globes intact. Other: No scalp lesions or scalp hematoma. CT CERVICAL SPINE FINDINGS Alignment: Normal Skull base and vertebrae: No acute cervical spine fracture. Remote anterior and interbody fusion changes at C5-6 and remote interbody fusion changes at C6-7. Soft tissues and spinal canal: No prevertebral fluid or swelling. No visible canal hematoma. Disc levels: No significant disc protrusions or spinal stenosis. Multilevel foraminal stenosis is noted largely due to uncinate spurring and facet disease. Upper chest: The lung apices are grossly clear. Scarring changes are noted. Other: Advanced bilateral carotid artery calcifications. IMPRESSION: 1. Stable age related cerebral atrophy, ventriculomegaly and periventricular white matter disease. 2. Remote right-sided cerebellar infarct with encephalomalacia. 3. No acute intracranial findings or skull fracture. 4. Normal alignment of the cervical spine without acute fracture. 5. Remote anterior and interbody fusion changes at C5-6 and C6-7. 6. Multilevel foraminal stenosis largely due to uncinate spurring and facet disease. Electronically Signed   By: Marijo Sanes M.D.   On: 09/28/2021 15:35   CT Cervical Spine Wo Contrast  Result Date: 09/28/2021 CLINICAL DATA:  Syncopal episode. EXAM: CT HEAD WITHOUT CONTRAST CT CERVICAL SPINE WITHOUT CONTRAST TECHNIQUE: Multidetector CT imaging of the head and cervical spine was performed following  the standard protocol without intravenous contrast. Multiplanar CT image reconstructions of the cervical spine were also generated. RADIATION DOSE REDUCTION: This exam was performed according to the departmental dose-optimization program which includes automated exposure control, adjustment of the mA and/or kV according to patient size and/or use of iterative reconstruction technique. COMPARISON:  08/20/2021 FINDINGS: CT HEAD FINDINGS Brain: Stable age related cerebral atrophy, ventriculomegaly and periventricular white matter disease. No extra-axial fluid collections are identified. No CT findings for acute hemispheric infarction or intracranial hemorrhage. No mass lesions. Stable cervical lower atrophy and evidence of remote right-sided cerebellar infarct with encephalomalacia. Vascular: Stable vascular calcifications. No aneurysm or hyperdense vessels. Skull: No skull fracture or bone lesions. Sinuses/Orbits: The paranasal sinuses and mastoid air cells are clear. The globes intact. Other: No scalp lesions or scalp hematoma. CT CERVICAL SPINE FINDINGS Alignment: Normal Skull base and vertebrae: No acute cervical spine fracture. Remote anterior and interbody fusion changes at C5-6 and remote interbody fusion changes at C6-7. Soft tissues and spinal canal: No prevertebral fluid or swelling. No visible canal hematoma. Disc levels: No significant disc protrusions or spinal stenosis. Multilevel foraminal stenosis is noted largely due to uncinate spurring and facet disease. Upper chest: The lung apices are grossly clear. Scarring changes are noted. Other: Advanced bilateral carotid artery calcifications. IMPRESSION: 1. Stable age related cerebral atrophy, ventriculomegaly and periventricular white matter disease. 2. Remote right-sided cerebellar infarct with encephalomalacia. 3. No acute intracranial findings or skull fracture. 4. Normal alignment of the cervical spine without acute fracture. 5. Remote anterior and  interbody fusion changes at C5-6 and C6-7. 6. Multilevel foraminal stenosis largely due to uncinate spurring and facet disease. Electronically Signed   By: Marijo Sanes M.D.   On: 09/28/2021 15:35    EKG: Independently reviewed.  Sinus rhythm rate 63, QTc 423.  Old first-degree AV block.  PR interval 261.  Assessment/Plan Principal Problem:   Pre-syncope Active Problems:   IPF (idiopathic pulmonary fibrosis) (HCC)   Essential hypertension   Dementia (HCC)   Seizure (HCC)   Cerebrovascular accident (CVA) due to embolism of left middle cerebral artery (HCC)   Assessment and Plan: * Pre-syncope Vitals stable, question vasovagal episode as he was trying to have a bowel movement.  EKG with old first-degree AV block otherwise unremarkable.  Head CT, CT neck- negative for acute abnormality.  No seizure activity. -Troponin x2 -Obtain chest x-ray -Orthostatic vitals -Echocardiogram - N/s 75cc/hr x 12hrs  Seizure (HCC) No reported seizure activity. -Resume Keppra  Dementia (HCC) Currently at baseline.  Alert and oriented to person and place.  Has baseline comprehension deficits, slurred speech.    DVT prophylaxis: Lovenox Code Status: DNR-confirmed with patient's Sister Barnetta Chapel on the phone. Family Communication: Patient's younger Sister Barnetta Chapel is HCPOA. Disposition Plan: ~ 1 day Consults called:  None  Admission status:  Obs tele   Author: [name], MD 09/28/2021 8:56 PM  For on call review [email or web address].

## 2021-09-28 NOTE — ED Provider Notes (Signed)
[facility] EMERGENCY DEPARTMENT Provider Note   CSN: [id number] Arrival date & time: 09/28/21  1315     History  Chief Complaint  Patient presents with   Loss of Consciousness    [name] is a 87 y.o. male.  HPI   Medical history including hypertension, CVA, dementia, wheelchair-bound, presents to the emergency department with complaints of a syncope, patient is a poor historian but states that he thinks he passed out, did not hit his head, had no other complaints.    Per patient's caregiver patient passed out after having a bowel movement, was proximal out for about 1 minute, he is at his baseline per his caregiver.    I spoke with patient's sister who informs me that the patient did not ever fall the ground, states that he passed out, have only 7 bowel movement, states that he was acting fine prior to the event, states that he is at his normal baseline, there is no seizure-like activities no bowel or urinary incontinency's.  Home Medications Prior to Admission medications   Medication Sig Start Date End Date Taking? Authorizing Provider  albuterol (PROVENTIL) (2.5 MG/3ML) 0.083% nebulizer solution Take 2.5 mg by nebulization daily as needed for wheezing or shortness of breath. 08/24/21   [provider]  aspirin EC 325 MG tablet TAKE 1 TABLET BY MOUTH ONCE A DAY. Patient taking differently: Take 325 mg by mouth daily. 08/23/21   Ivy Lynn, NP  atorvastatin (LIPITOR) 40 MG tablet TAKE (1) TABLET BY MOUTH AT BEDTIME. Patient taking differently: Take 40 mg by mouth daily. 08/25/21   Ivy Lynn, NP  levETIRAcetam (KEPPRA) 250 MG tablet TAKE (1) TABLET BY MOUTH EACH MORNING. Patient taking differently: Take 250 mg by mouth daily. 08/23/21   Ivy Lynn, NP  LORazepam (ATIVAN) 1 MG tablet Take 1 tablet (1 mg total) by mouth at bedtime. 05/11/21   Ivy Lynn, NP  lubiprostone (AMITIZA) 24 MCG capsule TAKE 1 CAPSULE BY MOUTH DAILY Patient taking  differently: Take 24 mcg by mouth daily with breakfast. 08/23/21   Ivy Lynn, NP  NITROSTAT 0.4 MG SL tablet PLACE 1 TAB UNDER TONGUE EVERY 5 MIN IF NEEDED FOR CHEST PAIN. MAY USE 3 TIMES.NO RELIEF CALL 911. Patient taking differently: Place 0.4 mg under the tongue every 5 (five) minutes as needed for chest pain. 12/18/17   Alycia Rossetti, MD  omeprazole (PRILOSEC) 20 MG capsule TAKE 1 CAPSULE BY MOUTH ONCE DAILY FOR RELFUX, ESOPHAGITIS, OR STOMACH ULCERS. Patient taking differently: Take 20 mg by mouth daily. 08/23/21   Ivy Lynn, NP  PARoxetine (PAXIL) 40 MG tablet TAKE 1 TABLET BY MOUTH ONCE A DAY. Patient taking differently: Take 40 mg by mouth daily. 08/23/21   Ivy Lynn, NP  QC VITAMIN B1 100 MG tablet TAKE 1 TABLET BY MOUTH ONCE A DAY. 08/23/21   Ivy Lynn, NP  Respiratory Therapy Supplies (NEBULIZER/TUBING/MOUTHPIECE) KIT Disp one nebulizer machine, tubing set and mouthpiece kit 08/17/17   Delsa Grana, PA-C  traZODone (DESYREL) 100 MG tablet TAKE (1) TABLET BY MOUTH AT BEDTIME AS NEEDED FOR SLEEP Patient taking differently: Take 100 mg by mouth at bedtime as needed for sleep. 08/23/21   Ivy Lynn, NP  vitamin B-12 (CYANOCOBALAMIN) 1000 MCG tablet Take one tablet by mouth once daily Patient taking differently: Take 1,000 mcg by mouth daily. 02/17/21   Ivy Lynn, NP      Allergies    Sulfa antibiotics  Review of Systems   Review of Systems  Constitutional:  Negative for chills and fever.  Respiratory:  Negative for shortness of breath.   Cardiovascular:  Negative for chest pain.  Gastrointestinal:  Negative for abdominal pain.  Neurological:  Positive for syncope. Negative for headaches.    Physical Exam Updated Vital Signs BP (!) 146/76   Pulse 66   Temp 97.6 F (36.4 C) (Oral)   Resp (!) 26   Ht _0  (1.753 m)   Wt 74 kg   SpO2 98%   BMI 24.09 kg/m  Physical Exam Vitals and nursing note reviewed.  Constitutional:       General: He is not in acute distress.    Appearance: He is not ill-appearing.  HENT:     Head: Normocephalic and atraumatic.     Comments: There is no deformity the head present no raccoon eyes or battle sign noted.    Nose: No congestion.     Mouth/Throat:     Mouth: Mucous membranes are moist.     Pharynx: Oropharynx is clear.     Comments: No trismus no torticollis no oral trauma Eyes:     Conjunctiva/sclera: Conjunctivae normal.  Cardiovascular:     Rate and Rhythm: Normal rate and regular rhythm.     Pulses: Normal pulses.     Heart sounds: No murmur heard.    No friction rub. No gallop.  Pulmonary:     Effort: No respiratory distress.     Breath sounds: No wheezing, rhonchi or rales.  Abdominal:     Palpations: Abdomen is soft.     Tenderness: There is no abdominal tenderness. There is no right CVA tenderness or left CVA tenderness.  Musculoskeletal:     Comments: Spine was palpated was nontender to palpation no step-off or deformities noted no pelvis instability no leg shortening no tenderness in the patient's upper and/or lower extremities neurovascular fully intact.  Skin:    General: Skin is warm and dry.  Neurological:     Mental Status: He is alert.     Comments: No facial asymmetry no difficulty with word finding following two-step commands, there is noted weakness in his lower legs bilaterally 4/5 strength versus 5/5 strength upper extremities.  Patient has known weakness in the lower extremities from previous CVA, he is wheelchair-bound.  Psychiatric:        Mood and Affect: Mood normal.     ED Results / Procedures / Treatments   Labs (all labs ordered are listed, but only abnormal results are displayed) Labs Reviewed  BASIC METABOLIC PANEL - Abnormal; Notable for the following components:      Result Value   Glucose, Bld 106 (*)    Calcium 8.8 (*)    All other components within normal limits  CBC WITH DIFFERENTIAL/PLATELET    EKG EKG  Interpretation  Date/Time:  Wednesday September 28 2021 13:38:19 EDT Ventricular Rate:  63 PR Interval:  261 QRS Duration: 85 QT Interval:  413 QTC Calculation: 423 R Axis:   76 Text Interpretation: Sinus or ectopic atrial rhythm Prolonged PR interval Confirmed by Dorie Rank [phone]) on 09/28/2021 5:00:41 PM  Radiology CT Head Wo Contrast  Result Date: 09/28/2021 CLINICAL DATA:  Syncopal episode. EXAM: CT HEAD WITHOUT CONTRAST CT CERVICAL SPINE WITHOUT CONTRAST TECHNIQUE: Multidetector CT imaging of the head and cervical spine was performed following the standard protocol without intravenous contrast. Multiplanar CT image reconstructions of the cervical spine were also generated. RADIATION DOSE REDUCTION: This  exam was performed according to the departmental dose-optimization program which includes automated exposure control, adjustment of the mA and/or kV according to patient size and/or use of iterative reconstruction technique. COMPARISON:  08/20/2021 FINDINGS: CT HEAD FINDINGS Brain: Stable age related cerebral atrophy, ventriculomegaly and periventricular white matter disease. No extra-axial fluid collections are identified. No CT findings for acute hemispheric infarction or intracranial hemorrhage. No mass lesions. Stable cervical lower atrophy and evidence of remote right-sided cerebellar infarct with encephalomalacia. Vascular: Stable vascular calcifications. No aneurysm or hyperdense vessels. Skull: No skull fracture or bone lesions. Sinuses/Orbits: The paranasal sinuses and mastoid air cells are clear. The globes intact. Other: No scalp lesions or scalp hematoma. CT CERVICAL SPINE FINDINGS Alignment: Normal Skull base and vertebrae: No acute cervical spine fracture. Remote anterior and interbody fusion changes at C5-6 and remote interbody fusion changes at C6-7. Soft tissues and spinal canal: No prevertebral fluid or swelling. No visible canal hematoma. Disc levels: No significant disc  protrusions or spinal stenosis. Multilevel foraminal stenosis is noted largely due to uncinate spurring and facet disease. Upper chest: The lung apices are grossly clear. Scarring changes are noted. Other: Advanced bilateral carotid artery calcifications. IMPRESSION: 1. Stable age related cerebral atrophy, ventriculomegaly and periventricular white matter disease. 2. Remote right-sided cerebellar infarct with encephalomalacia. 3. No acute intracranial findings or skull fracture. 4. Normal alignment of the cervical spine without acute fracture. 5. Remote anterior and interbody fusion changes at C5-6 and C6-7. 6. Multilevel foraminal stenosis largely due to uncinate spurring and facet disease. Electronically Signed   By: Marijo Sanes M.D.   On: 09/28/2021 15:35   CT Cervical Spine Wo Contrast  Result Date: 09/28/2021 CLINICAL DATA:  Syncopal episode. EXAM: CT HEAD WITHOUT CONTRAST CT CERVICAL SPINE WITHOUT CONTRAST TECHNIQUE: Multidetector CT imaging of the head and cervical spine was performed following the standard protocol without intravenous contrast. Multiplanar CT image reconstructions of the cervical spine were also generated. RADIATION DOSE REDUCTION: This exam was performed according to the departmental dose-optimization program which includes automated exposure control, adjustment of the mA and/or kV according to patient size and/or use of iterative reconstruction technique. COMPARISON:  08/20/2021 FINDINGS: CT HEAD FINDINGS Brain: Stable age related cerebral atrophy, ventriculomegaly and periventricular white matter disease. No extra-axial fluid collections are identified. No CT findings for acute hemispheric infarction or intracranial hemorrhage. No mass lesions. Stable cervical lower atrophy and evidence of remote right-sided cerebellar infarct with encephalomalacia. Vascular: Stable vascular calcifications. No aneurysm or hyperdense vessels. Skull: No skull fracture or bone lesions. Sinuses/Orbits:  The paranasal sinuses and mastoid air cells are clear. The globes intact. Other: No scalp lesions or scalp hematoma. CT CERVICAL SPINE FINDINGS Alignment: Normal Skull base and vertebrae: No acute cervical spine fracture. Remote anterior and interbody fusion changes at C5-6 and remote interbody fusion changes at C6-7. Soft tissues and spinal canal: No prevertebral fluid or swelling. No visible canal hematoma. Disc levels: No significant disc protrusions or spinal stenosis. Multilevel foraminal stenosis is noted largely due to uncinate spurring and facet disease. Upper chest: The lung apices are grossly clear. Scarring changes are noted. Other: Advanced bilateral carotid artery calcifications. IMPRESSION: 1. Stable age related cerebral atrophy, ventriculomegaly and periventricular white matter disease. 2. Remote right-sided cerebellar infarct with encephalomalacia. 3. No acute intracranial findings or skull fracture. 4. Normal alignment of the cervical spine without acute fracture. 5. Remote anterior and interbody fusion changes at C5-6 and C6-7. 6. Multilevel foraminal stenosis largely due to uncinate spurring and facet disease.  Electronically Signed   By: Marijo Sanes M.D.   On: 09/28/2021 15:35    Procedures Procedures    Medications Ordered in ED Medications - No data to display  ED Course/ Medical Decision Making/ A&P                           Medical Decision Making Amount and/or Complexity of Data Reviewed Labs: ordered. Radiology: ordered.   This patient presents to the ED for concern of syncope, this involves an extensive number of treatment options, and is a complaint that carries with it a high risk of complications and morbidity.  The differential diagnosis includes CVA, arrhythmia,     Additional history obtained:  Additional history obtained from patient's sister External records from outside source obtained and reviewed including previous neurology notes   Co morbidities  that complicate the patient evaluation  CVA, bedbound  Social Determinants of Health:  N/A    Lab Tests:  I Ordered, and personally interpreted labs.  The pertinent results include: CBC unremarkable, BMP shows glucose 106 calcium 8.8   Imaging Studies ordered:  I ordered imaging studies including CT head, C-spine I independently visualized and interpreted imaging which showed negative acute findings I agree with the radiologist interpretation   Cardiac Monitoring:  The patient was maintained on a cardiac monitor.  I personally viewed and interpreted the cardiac monitored which showed an underlying rhythm of: Without signs of ischemia   Medicines ordered and prescription drug management:  I ordered medication including N/A I have reviewed the patients home medicines and have made adjustments as needed  Critical Interventions:  N/A   Reevaluation: Presents with syncopal episode benign physical exam will obtain lab work imaging and reassess  Resting comfortably still having no complaints, unclear etiology of syncope, slightly bradycardic during visit would recommend continued monitoring for arrhythmias patient agreed this plan will consult hospitalist team  Consultations Obtained:  I requested consultation with the hospitalist dr Su Ley,  and discussed lab and imaging findings as well as pertinent plan - they recommend:  will admit the patient    Test Considered:  N/A    Rule out low suspicion for CVA or intracranial head bleed as patient denies change in vision, paresthesias or weakness to upper lower extremities, no new neuro deficits noted on exam, CT head did not reveal any acute findings.  Low suspicion for ACS or arrhythmias as patient denies chest pain, shortness of breath, no hypoperfusion or fluid overload on exam, EKG sinus without signs of ischemia.  Suspicion for seizure is low at this time no postictal state no tonic clonic like activity, no history  of this.  Low suspicion for systemic infection as patient is nontoxic-appearing, vital signs reassuring, no obvious source infection noted on exam.       Dispostion and problem list  After consideration of the diagnostic results and the patients response to treatment, I feel that the patent would benefit from admission.  Syncope-unclear etiology, patient will need further work-up for further evaluation.            Final Clinical Impression(s) / ED Diagnoses Final diagnoses:  Syncope, unspecified syncope type    Rx / DC Orders ED Discharge Orders     None         Marcello Fennel, PA-C 09/28/21 1754    Dorie Rank, MD 09/30/21 [phone]

## 2021-09-28 NOTE — ED Triage Notes (Signed)
Patient presents via EMS for syncope on the toilet. Patient states he felt dizzy prior to transferring himself from wheelchair to toilet and caregiver states patient stated he felt sick and passed out after bowel movement. Patient had LOC for about 1 minute per caregiver. CBG 174 with EMS. Patient hx of 2 CVA with residual comprehension difficulties and slurred speech. Per caregiver comprehension and speech is at patient's baseline.

## 2021-09-28 NOTE — Assessment & Plan Note (Signed)
Currently at baseline.  Alert and oriented to person and place.  Has baseline comprehension deficits, slurred speech.

## 2021-09-28 NOTE — Assessment & Plan Note (Signed)
No reported seizure activity. -Resume Keppra

## 2021-09-29 ENCOUNTER — Observation Stay (HOSPITAL_COMMUNITY): Payer: Medicare Other

## 2021-09-29 DIAGNOSIS — R55 Syncope and collapse: Secondary | ICD-10-CM | POA: Diagnosis not present

## 2021-09-29 NOTE — Progress Notes (Signed)
PROGRESS NOTE  [name]  ENI:[id number] DOB: 07-Apr-1934 DOA: 09/28/2021 PCP: Ivy Lynn, NP   Brief Narrative: Patient is a 87 year old male with history of hypertension, dementia, pulmonary fibrosis, CVA, seizure, OSA who presented here for further evaluation of syncopal episode.  As per the report he passed out while on toilet.  During presentation, he was awake, alert and mostly oriented.  He has remained hemodynamically stable.  Echocardiogram has been ordered which has been pending.  PT evaluation also pending today.  Assessment & Plan:  Principal Problem:   Pre-syncope Active Problems:   IPF (idiopathic pulmonary fibrosis) (HCC)   Essential hypertension   Dementia (HCC)   Seizure (HCC)   Cerebrovascular accident (CVA) due to embolism of left middle cerebral artery ([place])    Episode of syncope: Passed out while on the toilet.  Likely vasovagal. Remains hemodynamically stable.  CT head, cervical CT did not show any acute abnormality.  Shows atrophy, ventriculomegaly, remote cerebral infarct.  EKG showed sinus rhythm, normal Qtc Echocardiogram is pending. We also requested PT evaluation. Started on gentle IV fluids, now discontinued.  Troponins negative.  History of seizure: Continue Keppra  Dementia: Currently at baseline.  During this my evaluation this morning, he was alert and oriented.  Knew the current date and month Continue supportive care.  Has caregiver at home  Debility/deconditioning: Patient is not much ambulatory.  Ambulates with the help of wheelchair.  Requested PT evaluation.  TOC consulted       DVT prophylaxis:enoxaparin (LOVENOX) injection 40 mg Start: 09/28/21 2145     Code Status: DNR  Family Communication: None at bedside  Patient status:Inpatient  Patient is from :Home  Anticipated discharge IR:[facility]  Estimated DC date:tomorrow   Consultants: None  Procedures:None  Antimicrobials:  Anti-infectives (From admission, onward)     None       Subjective: Patient seen and examined at the bedside today.  Hemodynamically stable.  Lying comfortably in the bed.  Denies any complaints.  Alert and oriented.  Objective: Vitals:   09/28/21 2303 09/29/21 0200 09/29/21 0531 09/29/21 1404  BP: 116/68 127/67 (!) 147/91 120/85  Pulse: 70 67 72 90  Resp: '18 19 20 18  '$ Temp: 98.4 F (36.9 C) 97.9 F (36.6 C) 98 F (36.7 C) 98.1 F (36.7 C)  TempSrc:  Oral  Oral  SpO2: 98% 99% 96% 94%  Weight:      Height:        Intake/Output Summary (Last 24 hours) at 09/29/2021 1515 Last data filed at 09/29/2021 1459 Gross per 24 hour  Intake 395.08 ml  Output 950 ml  Net -554.92 ml   Filed Weights   09/28/21 1343  Weight: 74 kg    Examination:  General exam: Overall comfortable, not in distress HEENT: PERRL Respiratory system:  no wheezes or crackles  Cardiovascular system: S1 & S2 heard, RRR.  Gastrointestinal system: Abdomen is nondistended, soft and nontender. Central nervous system: Alert and oriented Extremities: No edema, no clubbing ,no cyanosis Skin: No rashes, no ulcers,no icterus     Data Reviewed: I have personally reviewed following labs and imaging studies  CBC: Recent Labs  Lab 09/28/21 1507  WBC 8.1  NEUTROABS 5.7  HGB 13.2  HCT 40.9  MCV 95.3  PLT 540   Basic Metabolic Panel: Recent Labs  Lab 09/28/21 1507  NA 139  K 4.3  CL 107  CO2 25  GLUCOSE 106*  BUN 14  CREATININE 0.88  CALCIUM 8.8*  No results found for this or any previous visit (from the past 240 hour(s)).   Radiology Studies: DG CHEST PORT 1 VIEW  Result Date: 09/28/2021 CLINICAL DATA:  Syncope EXAM: PORTABLE CHEST 1 VIEW COMPARISON:  Chest x-ray 08/24/2021.  CT of the chest 10/22/2018 FINDINGS: Fibrotic changes are seen in the lung bases, left greater than right, similar to prior. Mild increase in patchy airspace opacities in the left lung base. No pleural effusion or pneumothorax. Cardiomediastinal silhouette is  within normal limits. Calcified left mediastinal lymph nodes are again seen. No acute fractures. IMPRESSION: 1. Mild increase in patchy airspace opacities in the left lung base, which could represent atelectasis or pneumonia. 2. Chronic fibrotic changes in the lung bases. Electronically Signed   By: Ronney Asters M.D.   On: 09/28/2021 21:06   CT Head Wo Contrast  Result Date: 09/28/2021 CLINICAL DATA:  Syncopal episode. EXAM: CT HEAD WITHOUT CONTRAST CT CERVICAL SPINE WITHOUT CONTRAST TECHNIQUE: Multidetector CT imaging of the head and cervical spine was performed following the standard protocol without intravenous contrast. Multiplanar CT image reconstructions of the cervical spine were also generated. RADIATION DOSE REDUCTION: This exam was performed according to the departmental dose-optimization program which includes automated exposure control, adjustment of the mA and/or kV according to patient size and/or use of iterative reconstruction technique. COMPARISON:  08/20/2021 FINDINGS: CT HEAD FINDINGS Brain: Stable age related cerebral atrophy, ventriculomegaly and periventricular white matter disease. No extra-axial fluid collections are identified. No CT findings for acute hemispheric infarction or intracranial hemorrhage. No mass lesions. Stable cervical lower atrophy and evidence of remote right-sided cerebellar infarct with encephalomalacia. Vascular: Stable vascular calcifications. No aneurysm or hyperdense vessels. Skull: No skull fracture or bone lesions. Sinuses/Orbits: The paranasal sinuses and mastoid air cells are clear. The globes intact. Other: No scalp lesions or scalp hematoma. CT CERVICAL SPINE FINDINGS Alignment: Normal Skull base and vertebrae: No acute cervical spine fracture. Remote anterior and interbody fusion changes at C5-6 and remote interbody fusion changes at C6-7. Soft tissues and spinal canal: No prevertebral fluid or swelling. No visible canal hematoma. Disc levels: No  significant disc protrusions or spinal stenosis. Multilevel foraminal stenosis is noted largely due to uncinate spurring and facet disease. Upper chest: The lung apices are grossly clear. Scarring changes are noted. Other: Advanced bilateral carotid artery calcifications. IMPRESSION: 1. Stable age related cerebral atrophy, ventriculomegaly and periventricular white matter disease. 2. Remote right-sided cerebellar infarct with encephalomalacia. 3. No acute intracranial findings or skull fracture. 4. Normal alignment of the cervical spine without acute fracture. 5. Remote anterior and interbody fusion changes at C5-6 and C6-7. 6. Multilevel foraminal stenosis largely due to uncinate spurring and facet disease. Electronically Signed   By: Marijo Sanes M.D.   On: 09/28/2021 15:35   CT Cervical Spine Wo Contrast  Result Date: 09/28/2021 CLINICAL DATA:  Syncopal episode. EXAM: CT HEAD WITHOUT CONTRAST CT CERVICAL SPINE WITHOUT CONTRAST TECHNIQUE: Multidetector CT imaging of the head and cervical spine was performed following the standard protocol without intravenous contrast. Multiplanar CT image reconstructions of the cervical spine were also generated. RADIATION DOSE REDUCTION: This exam was performed according to the departmental dose-optimization program which includes automated exposure control, adjustment of the mA and/or kV according to patient size and/or use of iterative reconstruction technique. COMPARISON:  08/20/2021 FINDINGS: CT HEAD FINDINGS Brain: Stable age related cerebral atrophy, ventriculomegaly and periventricular white matter disease. No extra-axial fluid collections are identified. No CT findings for acute hemispheric infarction or intracranial hemorrhage. No  mass lesions. Stable cervical lower atrophy and evidence of remote right-sided cerebellar infarct with encephalomalacia. Vascular: Stable vascular calcifications. No aneurysm or hyperdense vessels. Skull: No skull fracture or bone lesions.  Sinuses/Orbits: The paranasal sinuses and mastoid air cells are clear. The globes intact. Other: No scalp lesions or scalp hematoma. CT CERVICAL SPINE FINDINGS Alignment: Normal Skull base and vertebrae: No acute cervical spine fracture. Remote anterior and interbody fusion changes at C5-6 and remote interbody fusion changes at C6-7. Soft tissues and spinal canal: No prevertebral fluid or swelling. No visible canal hematoma. Disc levels: No significant disc protrusions or spinal stenosis. Multilevel foraminal stenosis is noted largely due to uncinate spurring and facet disease. Upper chest: The lung apices are grossly clear. Scarring changes are noted. Other: Advanced bilateral carotid artery calcifications. IMPRESSION: 1. Stable age related cerebral atrophy, ventriculomegaly and periventricular white matter disease. 2. Remote right-sided cerebellar infarct with encephalomalacia. 3. No acute intracranial findings or skull fracture. 4. Normal alignment of the cervical spine without acute fracture. 5. Remote anterior and interbody fusion changes at C5-6 and C6-7. 6. Multilevel foraminal stenosis largely due to uncinate spurring and facet disease. Electronically Signed   By: Marijo Sanes M.D.   On: 09/28/2021 15:35    Scheduled Meds:  aspirin EC  325 mg Oral Daily   atorvastatin  40 mg Oral Daily   enoxaparin (LOVENOX) injection  40 mg Subcutaneous Q24H   levETIRAcetam  250 mg Oral Daily   lubiprostone  24 mcg Oral Q breakfast   nystatin   Topical TID   Continuous Infusions:   LOS: 0 days   Shelly Coss, MD [facility] P9/28/2023, 3:15 PM

## 2021-09-29 NOTE — Evaluation (Signed)
Physical Therapy Evaluation Patient Details Name: [name] MRN: [id number] DOB: 07/09/1934 Today's Date: 09/29/2021  History of Present Illness  [name] is a 87 y.o. male with medical history significant for  HTN, Dementia, Pulmonary fibrosis, CVA, seizure, OSA.  Patient was brought to the ED via EMS reports that he passed out while on the toilet.  On my evaluation, patient is awake alert and oriented to person and place, able to answer simple questions, but he is not able to give me details due to baseline dementia, comprehension deficits from prior stroke.  Denies chest pain, no difficulty breathing, no vomiting no loose stools.     I talked to patient's sister - [name] on the Phone, Patient Lives with Her.  Patient Has an Aide who was with Him Today.  The sister tells me that the aide reported that patient did not pass out, he acted like he was about to pass out.  He was trying to have a bowel movement on the commode when this happened.  Prior to this he was in his normal state of health.  No seizure activity.  No recent seizures.  He has had stable oral intake, no vomiting no loose stools.   Clinical Impression  Patient demonstrates fair/good return for sitting up at bedside, very unsteady on feet with uncontrollable shaking of BLE and limited to a few slow labored side steps before having to sit due to fall risk and legs giving way.  Patient tolerated sitting up in chair after therapy - nursing staff aware.  Patient will benefit from continued skilled physical therapy in hospital and recommended venue below to increase strength, balance, endurance for safe ADLs and gait.         Recommendations for follow up therapy are one component of a multi-disciplinary discharge planning process, led by the attending physician.  Recommendations may be updated based on patient status, additional functional criteria and insurance authorization.  Follow Up Recommendations No PT follow up       Assistance Recommended at Discharge Intermittent Supervision/Assistance  Patient can return home with the following  A lot of help with bathing/dressing/bathroom;A lot of help with walking and/or transfers;Help with stairs or ramp for entrance;Assistance with cooking/housework    Equipment Recommendations None recommended by PT  Recommendations for Other Services       Functional Status Assessment Patient has had a recent decline in their functional status and/or demonstrates limited ability to make significant improvements in function in a reasonable and predictable amount of time     Precautions / Restrictions Precautions Precautions: Fall Restrictions Weight Bearing Restrictions: No      Mobility  Bed Mobility Overal bed mobility: Needs Assistance Bed Mobility: Supine to Sit     Supine to sit: Min guard, Min assist     General bed mobility comments: increased time, labored movement    Transfers Overall transfer level: Needs assistance Equipment used: Rolling walker (2 wheels), 1 person hand held assist Transfers: Sit to/from Stand, Bed to chair/wheelchair/BSC Sit to Stand: Mod assist, Max assist   Step pivot transfers: Mod assist, Max assist       General transfer comment: very unsteady labored movement with shaking of legs    Ambulation/Gait Ambulation/Gait assistance: Max assist Gait Distance (Feet): 3 Feet Assistive device: Rolling walker (2 wheels) Gait Pattern/deviations: Decreased step length - right, Decreased step length - left, Decreased stride length Gait velocity: slow     General Gait Details: limited to a few slow labored unsteady  side steps with shaking of legs and buckling of knees  Stairs            Wheelchair Mobility    Modified Rankin (Stroke Patients Only)       Balance Overall balance assessment: Needs assistance Sitting-balance support: Feet supported, No upper extremity supported Sitting balance-Leahy Scale:  Fair Sitting balance - Comments: fair/good seated at EOB   Standing balance support: Reliant on assistive device for balance, During functional activity, Bilateral upper extremity supported Standing balance-Leahy Scale: Poor Standing balance comment: using RW                             Pertinent Vitals/Pain Pain Assessment Pain Assessment: No/denies pain    Home Living Family/patient expects to be discharged to:: Private residence Living Arrangements: Other relatives Available Help at Discharge: Family;Available 24 hours/day Type of Home: House Home Access: Level entry       Home Layout: One level Home Equipment: Wheelchair - manual;Rollator (4 wheels);Grab bars - tub/shower;Shower seat;BSC/3in1      Prior Function Prior Level of Function : Needs assist       Physical Assist : Mobility (physical);ADLs (physical) Mobility (physical): Bed mobility;Transfers;Gait   Mobility Comments: Patient states hew short distanced household ambulator using RW and assistance by family and assisted by family/home aide for transfers, but appears to be non-ambulatory ADLs Comments: assisted by family and home aide     Hand Dominance   Dominant Hand: Right    Extremity/Trunk Assessment   Upper Extremity Assessment Upper Extremity Assessment: Generalized weakness    Lower Extremity Assessment Lower Extremity Assessment: Generalized weakness    Cervical / Trunk Assessment Cervical / Trunk Assessment: Normal  Communication   Communication: No difficulties  Cognition Arousal/Alertness: Awake/alert Behavior During Therapy: WFL for tasks assessed/performed Overall Cognitive Status: History of cognitive impairments - at baseline                                          General Comments      Exercises     Assessment/Plan    PT Assessment Patient needs continued PT services  PT Problem List Decreased strength;Decreased activity tolerance;Decreased  balance;Decreased mobility       PT Treatment Interventions DME instruction;Functional mobility training;Therapeutic activities;Therapeutic exercise;Patient/family education;Wheelchair mobility training;Balance training    PT Goals (Current goals can be found in the Care Plan section)  Acute Rehab PT Goals Patient Stated Goal: return home with family and home aides to assist PT Goal Formulation: With patient Time For Goal Achievement: 10/01/21 Potential to Achieve Goals: Good    Frequency Min 2X/week     Co-evaluation               AM-PAC PT "6 Clicks" Mobility  Outcome Measure Help needed turning from your back to your side while in a flat bed without using bedrails?: A Little Help needed moving from lying on your back to sitting on the side of a flat bed without using bedrails?: A Little Help needed moving to and from a bed to a chair (including a wheelchair)?: A Lot Help needed standing up from a chair using your arms (e.g., wheelchair or bedside chair)?: A Lot Help needed to walk in hospital room?: Total Help needed climbing 3-5 steps with a railing? : Total 6 Click Score: 12    End  of Session   Activity Tolerance: Patient tolerated treatment well;Patient limited by fatigue Patient left: in chair;with call bell/phone within reach;with chair alarm set Nurse Communication: Mobility status PT Visit Diagnosis: Unsteadiness on feet (R26.81);Other abnormalities of gait and mobility (R26.89);Muscle weakness (generalized) (M62.81)    Time: 7591-6384 PT Time Calculation (min) (ACUTE ONLY): 30 min   Charges:   PT Evaluation $PT Eval Moderate Complexity: 1 Mod PT Treatments $Therapeutic Activity: 23-37 mins        3:44 PM, 09/29/21 Lonell Grandchild, MPT Physical Therapist with [facility] [phone] office [phone] mobile phone

## 2021-09-29 NOTE — Plan of Care (Signed)
  Problem: Acute Rehab PT Goals(only PT should resolve) Goal: Pt Will Go Supine/Side To Sit Outcome: Progressing Flowsheets (Taken 09/29/2021 1545) Pt will go Supine/Side to Sit:  with supervision  with min guard assist Goal: Patient Will Transfer Sit To/From Stand Outcome: Progressing Flowsheets (Taken 09/29/2021 1545) Patient will transfer sit to/from stand: with moderate assist Goal: Pt Will Transfer Bed To Chair/Chair To Bed Outcome: Progressing Flowsheets (Taken 09/29/2021 1545) Pt will Transfer Bed to Chair/Chair to Bed: with mod assist   3:46 PM, 09/29/21 Lonell Grandchild, MPT Physical Therapist with [facility] [phone] office [phone] mobile phone

## 2021-09-29 NOTE — Care Management Obs Status (Signed)
[place] NOTIFICATION   Patient Details  Name: [name] MRN: [id number] Date of Birth: [birth year]   Medicare Observation Status Notification Given:  Yes    Tommy Medal 09/29/2021, 3:12 PM

## 2021-09-29 NOTE — TOC Progression Note (Addendum)
  Transition of Care [facility]) Screening Note   Patient Details  Name: [name] Date of Birth: 1934/12/19   Transition of Care [facility]) CM/SW Contact:    Boneta Lucks, RN Phone Number: 09/29/2021, 12:14 PM    PT eval pending  Addendum : CM spoke with Caregiver. Patient is at baseline. He has 24/7 care. Caregiver 6 days a week all day and family the other times.  Offer caregiver Home health, she declined, they do therapy with the patient, and take care of his needs.   Transition of Care Department [facility]) has reviewed patient and no TOC needs have been identified at this time. We will continue to monitor patient advancement through interdisciplinary progression rounds. If new patient transition needs arise, please place a TOC consult.        Barriers to Discharge: Continued Medical Work up

## 2021-09-29 NOTE — Progress Notes (Signed)
Pt's HR noted to be as low as 30's on Tele while asleep. Checked up on pt, vitals stable and no complains at this time. MD informed and noted to have hx of AVB. Lear Ng, RN

## 2021-09-30 ENCOUNTER — Observation Stay (HOSPITAL_BASED_OUTPATIENT_CLINIC_OR_DEPARTMENT_OTHER): Payer: Medicare Other

## 2021-09-30 DIAGNOSIS — R5381 Other malaise: Secondary | ICD-10-CM | POA: Diagnosis not present

## 2021-09-30 DIAGNOSIS — R55 Syncope and collapse: Secondary | ICD-10-CM

## 2021-09-30 DIAGNOSIS — Z7401 Bed confinement status: Secondary | ICD-10-CM | POA: Diagnosis not present

## 2021-09-30 LAB — ECHOCARDIOGRAM COMPLETE
AR max vel: 2.61 cm2
AV Area VTI: 2.62 cm2
AV Area mean vel: 2.42 cm2
AV Mean grad: 2 mmHg
AV Peak grad: 3.7 mmHg
Ao pk vel: 0.97 m/s
Area-P 1/2: 5.66 cm2
Height: 69 in
MV VTI: 1.89 cm2
S' Lateral: 2.6 cm
Weight: 2610.25 oz

## 2021-09-30 NOTE — Discharge Summary (Signed)
Physician Discharge Summary  [name] YNW:[id number] DOB: 14-Jul-1934 DOA: 09/28/2021  PCP: Ivy Lynn, NP  Admit date: 09/28/2021 Discharge date: 09/30/2021  Admitted From: Home Disposition:  Home  Discharge Condition:Stable CODE STATUS: DNR Diet recommendation: Heart Healthy   Brief/Interim Summary:  Patient is a 87 year old male with history of hypertension, dementia, pulmonary fibrosis, CVA, seizure, OSA who presented here for further evaluation of syncopal episode.  As per the report he passed out while on toilet.  During presentation, he was awake, alert and mostly oriented.  He has remained hemodynamically stable.  Completed  syncopal work-up.  Echo showed same as before, EF of 60 to 65%, no valvular abnormalities.PT did not recommend any follow-up.  Medically stable for discharge back to home.  Following problems were addressed during his hospitalization:   Episode of syncope: Passed out while on the toilet.  Likely vasovagal. Remains hemodynamically stable.  CT head, cervical CT did not show any acute abnormality.  Shows atrophy, ventriculomegaly, remote cerebral infarct.  EKG showed sinus rhythm, normal Qtc Echocardiogram  showed same as before, EF of 60 to 65%, no valvular abnormalities. PT evaluation done.  No follow-up recommended Started on gentle IV fluids, now discontinued.  Troponins negative.  No chest pain.   History of seizure: Continue Keppra   Dementia: Currently at baseline.  During this my evaluation this morning, he was alert and oriented.  Knew the current date and month Continue supportive care.  Has caregiver at home  Hypertension: Apparently he was in amlodipine, lisinopril at home.  But his blood pressure stable without any medications.  We will discontinue this medications   Debility/deconditioning: Patient is not much ambulatory.  Ambulates with the help of wheelchair.   TOC consulted    Discharge Diagnoses:  Principal Problem:    Pre-syncope Active Problems:   IPF (idiopathic pulmonary fibrosis) (HCC)   Essential hypertension   Dementia (HCC)   Seizure (HCC)   Cerebrovascular accident (CVA) due to embolism of left middle cerebral artery [facility])    Discharge Instructions  Discharge Instructions     Diet - low sodium heart healthy   Complete by: As directed    Discharge instructions   Complete by: As directed    1)Please take your medications as instructed 2)Your blood pressure is stable without any medications.  We recommend to hold blood pressure pills.  Monitor blood pressure at home.  Need to restart if your blood pressure trends up   Increase activity slowly   Complete by: As directed       Allergies as of 09/30/2021       Reactions   Sulfa Antibiotics         Medication List     STOP taking these medications    amLODipine 5 MG tablet Commonly known as: NORVASC   lisinopril 20 MG tablet Commonly known as: ZESTRIL       TAKE these medications    albuterol (2.5 MG/3ML) 0.083% nebulizer solution Commonly known as: PROVENTIL Take 2.5 mg by nebulization daily as needed for wheezing or shortness of breath.   aspirin EC 325 MG tablet TAKE 1 TABLET BY MOUTH ONCE A DAY.   atorvastatin 40 MG tablet Commonly known as: LIPITOR TAKE (1) TABLET BY MOUTH AT BEDTIME. What changed: See the new instructions.   cyanocobalamin 1000 MCG tablet Commonly known as: VITAMIN B12 Take one tablet by mouth once daily   levETIRAcetam 250 MG tablet Commonly known as: KEPPRA TAKE (1) TABLET BY MOUTH EACH  MORNING. What changed: See the new instructions.   LORazepam 1 MG tablet Commonly known as: ATIVAN Take 1 tablet (1 mg total) by mouth at bedtime.   lubiprostone 24 MCG capsule Commonly known as: AMITIZA TAKE 1 CAPSULE BY MOUTH DAILY What changed: when to take this   Myrbetriq 50 MG Tb24 tablet Generic drug: mirabegron ER Take 50 mg by mouth daily.   Nebulizer/Tubing/Mouthpiece Kit Disp one  nebulizer machine, tubing set and mouthpiece kit   Nitrostat 0.4 MG SL tablet Generic drug: nitroGLYCERIN PLACE 1 TAB UNDER TONGUE EVERY 5 MIN IF NEEDED FOR CHEST PAIN. MAY USE 3 TIMES.NO RELIEF CALL 911. What changed: See the new instructions.   omeprazole 20 MG capsule Commonly known as: PRILOSEC TAKE 1 CAPSULE BY MOUTH ONCE DAILY FOR RELFUX, ESOPHAGITIS, OR STOMACH ULCERS. What changed: See the new instructions.   PARoxetine 40 MG tablet Commonly known as: PAXIL TAKE 1 TABLET BY MOUTH ONCE A DAY.   QC Vitamin B1 100 MG tablet Generic drug: thiamine TAKE 1 TABLET BY MOUTH ONCE A DAY.   traZODone 100 MG tablet Commonly known as: DESYREL TAKE (1) TABLET BY MOUTH AT BEDTIME AS NEEDED FOR SLEEP What changed: See the new instructions.        Follow-up Information     Ivy Lynn, NP. Schedule an appointment as soon as possible for a visit in 1 week(s).   Specialty: Nurse Practitioner Contact information: [place] [address] [phone]                Allergies  Allergen Reactions   Sulfa Antibiotics     Consultations: None   Procedures/Studies: ECHOCARDIOGRAM COMPLETE  Result Date: 09/30/2021    ECHOCARDIOGRAM REPORT   Patient Name:   [name] Date of Exam: 09/30/2021 Medical Rec #:  [id number]  Height:       69.0 in Accession #:    [id number] Weight:       163.1 lb Date of Birth:  Nov 14, 1934  BSA:          1.895 m Patient Age:    3 years   BP:           138/66 mmHg Patient Gender: M          HR:           73 bpm. Exam Location:  [facility] Procedure: 2D Echo, Cardiac Doppler and Color Doppler Indications:    Pre-Syncope  History:        Patient has prior history of Echocardiogram examinations, most                 recent 03/03/2017. Stroke; Risk Factors:Hypertension and Current                 Smoker. Pre-syncope.  Sonographer:    Wenda Low Referring Phys: Liberal  1. Left ventricular ejection  fraction, by estimation, is 60 to 65%. The left ventricle has normal function. The left ventricle has no regional wall motion abnormalities. There is mild asymmetric left ventricular hypertrophy of the basal-septal segment. Left ventricular diastolic parameters were normal.  2. Right ventricular systolic function is hyperdynamic. The right ventricular size is normal. There is normal pulmonary artery systolic pressure.  3. The mitral valve is normal in structure. Mild mitral valve regurgitation. No evidence of mitral stenosis.  4. The aortic valve is tricuspid. Aortic valve regurgitation is not visualized. No aortic stenosis is present.  5. The inferior  vena cava is normal in size with greater than 50% respiratory variability, suggesting right atrial pressure of 3 mmHg. Comparison(s): No significant change from prior study. FINDINGS  Left Ventricle: Left ventricular ejection fraction, by estimation, is 60 to 65%. The left ventricle has normal function. The left ventricle has no regional wall motion abnormalities. The left ventricular internal cavity size was normal in size. There is  mild asymmetric left ventricular hypertrophy of the basal-septal segment. Left ventricular diastolic parameters were normal. Right Ventricle: The right ventricular size is normal. Right vetricular wall thickness was not well visualized. Right ventricular systolic function is hyperdynamic. There is normal pulmonary artery systolic pressure. The tricuspid regurgitant velocity is  2.84 m/s, and with an assumed right atrial pressure of 3 mmHg, the estimated right ventricular systolic pressure is 59.5 mmHg. Left Atrium: Left atrial size was normal in size. Right Atrium: Right atrial size was normal in size. Pericardium: Trivial pericardial effusion is present. The pericardial effusion is anterior to the right ventricle. Mitral Valve: The mitral valve is normal in structure. Mild mitral valve regurgitation. No evidence of mitral valve  stenosis. MV peak gradient, 6.0 mmHg. The mean mitral valve gradient is 2.0 mmHg. Tricuspid Valve: The tricuspid valve is normal in structure. Tricuspid valve regurgitation is trivial. No evidence of tricuspid stenosis. Aortic Valve: The aortic valve is tricuspid. Aortic valve regurgitation is not visualized. No aortic stenosis is present. Aortic valve mean gradient measures 2.0 mmHg. Aortic valve peak gradient measures 3.7 mmHg. Aortic valve area, by VTI measures 2.62 cm. Pulmonic Valve: The pulmonic valve was normal in structure. Pulmonic valve regurgitation is not visualized. No evidence of pulmonic stenosis. Aorta: The aortic root and ascending aorta are structurally normal, with no evidence of dilitation. Venous: The inferior vena cava is normal in size with greater than 50% respiratory variability, suggesting right atrial pressure of 3 mmHg. IAS/Shunts: No atrial level shunt detected by color flow Doppler.  LEFT VENTRICLE PLAX 2D LVIDd:         4.60 cm   Diastology LVIDs:         2.60 cm   LV e' medial:    15.45 cm/s LV PW:         0.90 cm   LV E/e' medial:  4.2 LV IVS:        1.10 cm   LV e' lateral:   17.20 cm/s LVOT diam:     2.00 cm   LV E/e' lateral: 3.8 LV SV:         53 LV SV Index:   28 LVOT Area:     3.14 cm  RIGHT VENTRICLE RV Basal diam:  3.05 cm RV Mid diam:    2.60 cm TAPSE (M-mode): 2.2 cm LEFT ATRIUM           Index        RIGHT ATRIUM           Index LA diam:      3.40 cm 1.79 cm/m   RA Area:     15.60 cm LA Vol (A2C): 47.3 ml 24.96 ml/m  RA Volume:   38.30 ml  20.21 ml/m LA Vol (A4C): 33.3 ml 17.58 ml/m  AORTIC VALVE                    PULMONIC VALVE AV Area (Vmax):    2.61 cm     PV Vmax:       1.18 m/s AV Area (Vmean):   2.42  cm     PV Peak grad:  5.6 mmHg AV Area (VTI):     2.62 cm AV Vmax:           96.70 cm/s AV Vmean:          65.400 cm/s AV VTI:            0.204 m AV Peak Grad:      3.7 mmHg AV Mean Grad:      2.0 mmHg LVOT Vmax:         80.30 cm/s LVOT Vmean:        50.400  cm/s LVOT VTI:          0.170 m LVOT/AV VTI ratio: 0.83  AORTA Ao Root diam: 3.90 cm Ao Asc diam:  3.40 cm MITRAL VALVE                TRICUSPID VALVE MV Area (PHT): 5.66 cm     TR Peak grad:   32.3 mmHg MV Area VTI:   1.89 cm     TR Vmax:        284.00 cm/s MV Peak grad:  6.0 mmHg MV Mean grad:  2.0 mmHg     SHUNTS MV Vmax:       1.22 m/s     Systemic VTI:  0.17 m MV Vmean:      65.3 cm/s    Systemic Diam: 2.00 cm MV Decel Time: 134 msec MV E velocity: 65.60 cm/s MV A velocity: 126.00 cm/s MV E/A ratio:  0.52 Rudean Haskell MD Electronically signed by Rudean Haskell MD Signature Date/Time: 09/30/2021/12:51:20 PM    Final    DG CHEST PORT 1 VIEW  Result Date: 09/28/2021 CLINICAL DATA:  Syncope EXAM: PORTABLE CHEST 1 VIEW COMPARISON:  Chest x-ray 08/24/2021.  CT of the chest 10/22/2018 FINDINGS: Fibrotic changes are seen in the lung bases, left greater than right, similar to prior. Mild increase in patchy airspace opacities in the left lung base. No pleural effusion or pneumothorax. Cardiomediastinal silhouette is within normal limits. Calcified left mediastinal lymph nodes are again seen. No acute fractures. IMPRESSION: 1. Mild increase in patchy airspace opacities in the left lung base, which could represent atelectasis or pneumonia. 2. Chronic fibrotic changes in the lung bases. Electronically Signed   By: Ronney Asters M.D.   On: 09/28/2021 21:06   CT Head Wo Contrast  Result Date: 09/28/2021 CLINICAL DATA:  Syncopal episode. EXAM: CT HEAD WITHOUT CONTRAST CT CERVICAL SPINE WITHOUT CONTRAST TECHNIQUE: Multidetector CT imaging of the head and cervical spine was performed following the standard protocol without intravenous contrast. Multiplanar CT image reconstructions of the cervical spine were also generated. RADIATION DOSE REDUCTION: This exam was performed according to the departmental dose-optimization program which includes automated exposure control, adjustment of the mA and/or kV  according to patient size and/or use of iterative reconstruction technique. COMPARISON:  08/20/2021 FINDINGS: CT HEAD FINDINGS Brain: Stable age related cerebral atrophy, ventriculomegaly and periventricular white matter disease. No extra-axial fluid collections are identified. No CT findings for acute hemispheric infarction or intracranial hemorrhage. No mass lesions. Stable cervical lower atrophy and evidence of remote right-sided cerebellar infarct with encephalomalacia. Vascular: Stable vascular calcifications. No aneurysm or hyperdense vessels. Skull: No skull fracture or bone lesions. Sinuses/Orbits: The paranasal sinuses and mastoid air cells are clear. The globes intact. Other: No scalp lesions or scalp hematoma. CT CERVICAL SPINE FINDINGS Alignment: Normal Skull base and vertebrae: No acute cervical spine fracture. Remote anterior and interbody  fusion changes at C5-6 and remote interbody fusion changes at C6-7. Soft tissues and spinal canal: No prevertebral fluid or swelling. No visible canal hematoma. Disc levels: No significant disc protrusions or spinal stenosis. Multilevel foraminal stenosis is noted largely due to uncinate spurring and facet disease. Upper chest: The lung apices are grossly clear. Scarring changes are noted. Other: Advanced bilateral carotid artery calcifications. IMPRESSION: 1. Stable age related cerebral atrophy, ventriculomegaly and periventricular white matter disease. 2. Remote right-sided cerebellar infarct with encephalomalacia. 3. No acute intracranial findings or skull fracture. 4. Normal alignment of the cervical spine without acute fracture. 5. Remote anterior and interbody fusion changes at C5-6 and C6-7. 6. Multilevel foraminal stenosis largely due to uncinate spurring and facet disease. Electronically Signed   By: Marijo Sanes M.D.   On: 09/28/2021 15:35   CT Cervical Spine Wo Contrast  Result Date: 09/28/2021 CLINICAL DATA:  Syncopal episode. EXAM: CT HEAD WITHOUT  CONTRAST CT CERVICAL SPINE WITHOUT CONTRAST TECHNIQUE: Multidetector CT imaging of the head and cervical spine was performed following the standard protocol without intravenous contrast. Multiplanar CT image reconstructions of the cervical spine were also generated. RADIATION DOSE REDUCTION: This exam was performed according to the departmental dose-optimization program which includes automated exposure control, adjustment of the mA and/or kV according to patient size and/or use of iterative reconstruction technique. COMPARISON:  08/20/2021 FINDINGS: CT HEAD FINDINGS Brain: Stable age related cerebral atrophy, ventriculomegaly and periventricular white matter disease. No extra-axial fluid collections are identified. No CT findings for acute hemispheric infarction or intracranial hemorrhage. No mass lesions. Stable cervical lower atrophy and evidence of remote right-sided cerebellar infarct with encephalomalacia. Vascular: Stable vascular calcifications. No aneurysm or hyperdense vessels. Skull: No skull fracture or bone lesions. Sinuses/Orbits: The paranasal sinuses and mastoid air cells are clear. The globes intact. Other: No scalp lesions or scalp hematoma. CT CERVICAL SPINE FINDINGS Alignment: Normal Skull base and vertebrae: No acute cervical spine fracture. Remote anterior and interbody fusion changes at C5-6 and remote interbody fusion changes at C6-7. Soft tissues and spinal canal: No prevertebral fluid or swelling. No visible canal hematoma. Disc levels: No significant disc protrusions or spinal stenosis. Multilevel foraminal stenosis is noted largely due to uncinate spurring and facet disease. Upper chest: The lung apices are grossly clear. Scarring changes are noted. Other: Advanced bilateral carotid artery calcifications. IMPRESSION: 1. Stable age related cerebral atrophy, ventriculomegaly and periventricular white matter disease. 2. Remote right-sided cerebellar infarct with encephalomalacia. 3. No acute  intracranial findings or skull fracture. 4. Normal alignment of the cervical spine without acute fracture. 5. Remote anterior and interbody fusion changes at C5-6 and C6-7. 6. Multilevel foraminal stenosis largely due to uncinate spurring and facet disease. Electronically Signed   By: Marijo Sanes M.D.   On: 09/28/2021 15:35      Subjective: Patient seen and examined at bedside today.  Hemodynamically stable for discharge.  I called the sister and updated about the discharge planning  Discharge Exam: Vitals:   09/29/21 2118 09/30/21 0418  BP: 120/71 138/66  Pulse: 77 73  Resp: 18 16  Temp: 97.7 F (36.5 C) 98.3 F (36.8 C)  SpO2: 100% 96%   Vitals:   09/29/21 0531 09/29/21 1404 09/29/21 2118 09/30/21 0418  BP: (!) 147/91 120/85 120/71 138/66  Pulse: 72 90 77 73  Resp: _0 Temp: 98 F (36.7 C) 98.1 F (36.7 C) 97.7 F (36.5 C) 98.3 F (36.8 C)  TempSrc:  Oral Oral  SpO2: 96% 94% 100% 96%  Weight:      Height:        General: Pt is alert, awake, not in acute distress Cardiovascular: RRR, S1/S2 +, no rubs, no gallops Respiratory: CTA bilaterally, no wheezing, no rhonchi Abdominal: Soft, NT, ND, bowel sounds + Extremities: no edema, no cyanosis    The results of significant diagnostics from this hospitalization (including imaging, microbiology, ancillary and laboratory) are listed below for reference.     Microbiology: No results found for this or any previous visit (from the past 240 hour(s)).   Labs: BNP (last 3 results) Recent Labs    08/20/21 1126 08/24/21 1521  BNP 27.0 18.9   Basic Metabolic Panel: Recent Labs  Lab 09/28/21 1507  NA 139  K 4.3  CL 107  CO2 25  GLUCOSE 106*  BUN 14  CREATININE 0.88  CALCIUM 8.8*   Liver Function Tests: No results for input(s): "AST", "ALT", "ALKPHOS", "BILITOT", "PROT", "ALBUMIN" in the last 168 hours. No results for input(s): "LIPASE", "AMYLASE" in the last 168 hours. No results for input(s):  "AMMONIA" in the last 168 hours. CBC: Recent Labs  Lab 09/28/21 1507  WBC 8.1  NEUTROABS 5.7  HGB 13.2  HCT 40.9  MCV 95.3  PLT 220   Cardiac Enzymes: No results for input(s): "CKTOTAL", "CKMB", "CKMBINDEX", "TROPONINI" in the last 168 hours. BNP: Invalid input(s): "POCBNP" CBG: No results for input(s): "GLUCAP" in the last 168 hours. D-Dimer No results for input(s): "DDIMER" in the last 72 hours. Hgb A1c No results for input(s): "HGBA1C" in the last 72 hours. Lipid Profile No results for input(s): "CHOL", "HDL", "LDLCALC", "TRIG", "CHOLHDL", "LDLDIRECT" in the last 72 hours. Thyroid function studies No results for input(s): "TSH", "T4TOTAL", "T3FREE", "THYROIDAB" in the last 72 hours.  Invalid input(s): "FREET3" Anemia work up No results for input(s): "VITAMINB12", "FOLATE", "FERRITIN", "TIBC", "IRON", "RETICCTPCT" in the last 72 hours. Urinalysis    Component Value Date/Time   COLORURINE YELLOW 08/24/2021 1921   APPEARANCEUR CLEAR 08/24/2021 1921   APPEARANCEUR Clear 08/12/2021 1236   LABSPEC 1.020 08/24/2021 1921   PHURINE 6.0 08/24/2021 1921   GLUCOSEU NEGATIVE 08/24/2021 1921   HGBUR NEGATIVE 08/24/2021 1921   BILIRUBINUR NEGATIVE 08/24/2021 1921   BILIRUBINUR Negative 08/12/2021 1236   KETONESUR NEGATIVE 08/24/2021 1921   PROTEINUR 30 (A) 08/24/2021 1921   UROBILINOGEN 0.2 04/26/2014 0930   NITRITE NEGATIVE 08/24/2021 1921   LEUKOCYTESUR NEGATIVE 08/24/2021 1921   Sepsis Labs Recent Labs  Lab 09/28/21 1507  WBC 8.1   Microbiology No results found for this or any previous visit (from the past 240 hour(s)).  Please note: You were cared for by a hospitalist during your hospital stay. Once you are discharged, your primary care physician will handle any further medical issues. Please note that NO REFILLS for any discharge medications will be authorized once you are discharged, as it is imperative that you return to your primary care physician (or establish a  relationship with a primary care physician if you do not have one) for your post hospital discharge needs so that they can reassess your need for medications and monitor your lab values.    Time coordinating discharge: 40 minutes  SIGNED:   Shelly Coss, MD  [facility] 09/30/2021, 12:59 PM Pager [phone]  If 7PM-7AM, please contact night-coverage [email or web address] Password [email or web address]

## 2021-09-30 NOTE — Progress Notes (Signed)
*  PRELIMINARY RESULTS* Echocardiogram 2D Echocardiogram has been performed.  [name] 09/30/2021, 9:24 AM

## 2021-09-30 NOTE — Progress Notes (Signed)
Pt discharged to home via [facility].  All LDAs removed.  Belongings returned to patient.  Copy of AVS printed and given to pt, advised to have sister call if any questions over appts, meds, etc.  Pt sister Barnetta Chapel notified that transport was here and would be leaving soon with pt, Barnetta Chapel verbalized understanding.  Pt A/O per baseline on discharge.

## 2021-10-14 ENCOUNTER — Ambulatory Visit (INDEPENDENT_AMBULATORY_CARE_PROVIDER_SITE_OTHER): Payer: Medicare Other | Admitting: Nurse Practitioner

## 2021-10-14 ENCOUNTER — Encounter: Payer: Self-pay | Admitting: Nurse Practitioner

## 2021-10-14 VITALS — BP 107/66 | HR 88 | Temp 98.6°F | Ht 69.0 in | Wt 125.0 lb

## 2021-10-14 DIAGNOSIS — I9589 Other hypotension: Secondary | ICD-10-CM

## 2021-10-14 DIAGNOSIS — I10 Essential (primary) hypertension: Secondary | ICD-10-CM | POA: Diagnosis not present

## 2021-10-14 DIAGNOSIS — R21 Rash and other nonspecific skin eruption: Secondary | ICD-10-CM | POA: Diagnosis not present

## 2021-10-14 DIAGNOSIS — E861 Hypovolemia: Secondary | ICD-10-CM | POA: Diagnosis not present

## 2021-10-14 DIAGNOSIS — K2100 Gastro-esophageal reflux disease with esophagitis, without bleeding: Secondary | ICD-10-CM | POA: Diagnosis not present

## 2021-10-14 MED ORDER — CLOTRIMAZOLE-BETAMETHASONE 1-0.05 % EX CREA: 1.0000 | TOPICAL_CREAM | Freq: Every day | CUTANEOUS | 0 refills | Status: DC

## 2021-10-14 NOTE — Patient Instructions (Signed)
Syncope, Adult  Syncope is when you pass out or faint for a short time. It is caused by a sudden decrease in blood flow to the brain. This can happen for many reasons. It can sometimes happen when seeing blood, getting a shot (injection), or having pain or strong emotions. Most causes of fainting are not dangerous, but in some cases it can be a sign of a serious medical problem. If you faint, get help right away. Call your local emergency services (911 in the [place] Follow these instructions at home: Watch for any changes in your symptoms. Take these actions to stay safe and help with your symptoms: Knowing when you may be about to faint Signs that you may be about to faint include: Feeling dizzy or light-headed. It may feel like the room is spinning. Feeling weak. Feeling like you may vomit (nauseous). Seeing spots or seeing all white or all black. Having cold, clammy skin. Feeling warm and sweaty. Hearing ringing in the ears. If you start to feel like you might faint, sit or lie down right away. If sitting, lower your head down between your legs. If lying down, raise (elevate) your feet above the level of your heart. Breathe deeply and steadily. Wait until all of the symptoms are gone. Have someone stay with you until you feel better. Medicines Take over-the-counter and prescription medicines only as told by your doctor. If you are taking blood pressure or heart medicine, sit up and stand up slowly. Spend a few minutes getting ready to sit and then stand. This can help you feel less dizzy. Lifestyle Do not drive, use machinery, or play sports until your doctor says it is okay. Do not drink alcohol. Do not smoke or use any products that contain nicotine or tobacco. If you need help quitting, ask your doctor. Avoid hot tubs and saunas. General instructions Talk with your doctor about your symptoms. You may need to have testing to help find the cause. Drink enough fluid to keep your pee  (urine) pale yellow. Avoid standing for a long time. If you must stand for a long time, do movements such as: Moving your legs. Crossing your legs. Flexing and stretching your leg muscles. Squatting. Keep all follow-up visits. Contact a doctor if: You have episodes of near fainting. Get help right away if: You pass out or faint. You hit your head or are injured after fainting. You have any of these symptoms: Fast or uneven heartbeats (palpitations). Pain in your chest, belly, or back. Shortness of breath. You have jerky movements that you cannot control (seizure). You have a very bad headache. You are confused. You have problems with how you see (vision). You are very weak. You have trouble walking. You are bleeding from your mouth or your butt (rectum). You have black or tarry poop (stool). These symptoms may be an emergency. Get help right away. Call your local emergency services (911 in the [place] Do not wait to see if the symptoms will go away. Do not drive yourself to the hospital. Summary Syncope is when you pass out or faint for a short time. It is caused by a sudden decrease in blood flow to the brain. Signs that you may be about to faint include feeling dizzy or light-headed, feeling like you may vomit, seeing all white or all black, or having cold, clammy skin. If you start to feel like you might faint, sit or lie down right away. Lower your head if sitting, or raise (elevate)   your feet if lying down. Breathe deeply and steadily. Wait until all of the symptoms are gone. This information is not intended to replace advice given to you by your health care provider. Make sure you discuss any questions you have with your health care provider. Document Revised: 04/29/2020 Document Reviewed: 04/29/2020 Elsevier Patient Education  2023 [employer]  

## 2021-10-14 NOTE — Progress Notes (Addendum)
Established Patient Office Visit  Subjective   Patient ID: [name], male    DOB: 12-Apr-1934  Age: 87 y.o. MRN: [id number]  Chief Complaint  Patient presents with   Medical Management of Chronic Issues    3 month   Wheezing    Diaper Rash This is a new (bilateral inner thigh) problem. The current episode started in the past 7 days. The problem occurs constantly. The problem has been gradually improving. Associated symptoms include congestion and coughing. Pertinent negatives include no abdominal pain, chills, fever or rash. Exacerbated by: moisture. Treatments tried: clotrimazole. The treatment provided moderate relief.   Patient is following up after syncopal episode.  Symptoms started in the past few days with hypotension.  No fever, body ache or headache associated with current symptoms.  Patient was recently seen in the emergency department for a near syncopal episode.  Caregivers reported that he did not pass out 1 more time after being discharged from the hospital.  Caregiver was advised to discontinue blood pressure medication.  Which she did 2 days ago after the second episode of syncope.  Patient Active Problem List   Diagnosis Date Noted   Pre-syncope 09/28/2021   Acute respiratory failure with hypoxia ([place]) 08/24/2021   AKI (acute kidney injury) ([place]) 80/32/1224   Acute metabolic encephalopathy 82/50/0370   Anxiety 01/07/2021   Elevated PSA 10/28/2020   Urinary retention 10/25/2020   Abnormal urine odor 04/21/2020   Depression, recurrent ([place]) 04/21/2020   Cerebrovascular accident (CVA) due to embolism of left middle cerebral artery ([place]) 12/14/2018   Abnormal findings on diagnostic imaging of lung 08/19/2018   IPF (idiopathic pulmonary fibrosis) ([place]) 08/19/2018   OSA (obstructive sleep apnea) 08/19/2018   Carotid artery disease ([place]) 03/26/2017   Internal carotid artery stenosis, left 03/06/2017   Dysphagia 03/06/2017   Pulmonary nodules 03/06/2017   Seizure ([place])  03/03/2017   Protein-calorie malnutrition ([employer]) 01/10/2017   Chronic pain 10/02/2016   Depression, major, single episode, moderate ([place]) 08/31/2016   Chronic insomnia 08/31/2016   Lumbar spinal stenosis 08/18/2016   Wheelchair dependence 08/18/2016   Dementia ([place]) 08/18/2016   Urinary incontinence 08/18/2016   Polypharmacy 08/18/2016   Laryngopharyngeal reflux (LPR) 08/12/2015   Memory difficulty 08/04/2014   Numbness and tingling of right arm 08/04/2014   Abnormality of gait 08/01/2013   Anemia 12/05/2012   Early satiety 12/03/2012   History of colon polyps 12/03/2012   Facial droop 10/12/2012   Spinal stenosis of cervical region 07/01/2012   GERD 05/27/2009   DYSPHAGIA UNSPECIFIED 05/27/2009   CHANGE IN BOWELS 05/27/2009   SMOKER 09/09/2008   Essential hypertension 09/09/2008   History of stroke 09/09/2008   Constipation 09/09/2008   HIGH BLOOD PRESSURE 07/25/2006   Past Medical History:  Diagnosis Date   Abnormality of gait 08/01/2013   Arthritis    Blindness of left eye    Posttraumatic   Carotid artery disease (HCC)    Colon polyp    Constipation    Diaphragmatic hernia    Disc disease, degenerative, cervical    Diverticulitis    ED (erectile dysfunction)    Gastroparesis    GERD (gastroesophageal reflux disease)    Glaucoma    Heart disease    Hyperlipidemia    Hypertension    Insomnia    Memory difficulty 08/04/2014   Nicotine dependence    Numbness and tingling of right arm 08/04/2014   Osteoarthritis    Overactive bladder    Radiculopathy  Shortness of breath    with activity   Sleep apnea    does not use CPAP.  Tested maybe 5- 10 years ago   Stroke [facility])    Left side weakness.   Unilateral inguinal hernia    Past Surgical History:  Procedure Laterality Date   ANTERIOR CERVICAL DECOMP/DISCECTOMY FUSION N/A 07/01/2012   Procedure: ANTERIOR CERVICAL DECOMPRESSION/DISCECTOMY FUSION CERVICAL FIVE-SIX [name] REMOVAL CERVICAL SIX-SEVEN;  Surgeon:  Benjamin Pitter, Vaughan;  Location: [facility];  Service: Neurosurgery;  Laterality: N/A;   CERVICAL FUSION     COLONOSCOPY   08/04/2002     RMR: Normal rectum/Pancolonic diverticula/Colonic polyps as described above, biopsied and/or snared   COLONOSCOPY  60/08/2009   RMR: normal rectum/left and right sided diverticula/multiple colonic polyps. tubular adenomas. surveillance due 2014   COLONOSCOPY WITH ESOPHAGOGASTRODUODENOSCOPY (EGD) N/A 12/18/2012   Dr. Gala Vaughan:  Colonic diverticulosis. Multiple colonic polyps-hyperplastic polyps and tubular adenoma. Friable anal canal hemorrhoids. EGD with chronic atrophic gastritis   ESOPHAGOGASTRODUODENOSCOPY  04/14/2002   YFV:[id number]'F' ring, status post dilation as described above/Hiatal hernia, focal antral erosions of uncertain clinical significance   ESOPHAGOGASTRODUODENOSCOPY  06/09/2009   RMR: normal esophagus s/p dilator/small hiatal hernia otherwise normal   HERNIA REPAIR Right    inguinal   I & D EXTREMITY Right 04/23/2017   Procedure: [place] HAND;  Surgeon: Benjamin Butters, Vaughan;  Location: [facility];  Service: Orthopedics;  Laterality: Right;   INGUINAL HERNIA REPAIR Left 04/15/2014   Procedure: LEFT INGUINAL HERNIORRHAPHY WITH MESH;  Surgeon: Benjamin Signs Md, Vaughan;  Location: [facility];  Service: General;  Laterality: Left;   INSERTION OF MESH Left 04/15/2014   Procedure: INSERTION OF MESH;  Surgeon: Benjamin Signs Md, Vaughan;  Location: [facility];  Service: General;  Laterality: Left;   Social History   Tobacco Use   Smoking status: Every Day    Packs/day: 0.25    Years: 60.00    Total pack years: 15.00    Types: Cigarettes   Smokeless tobacco: Never   Tobacco comments:    using patch  Vaping Use   Vaping Use: Never used  Substance Use Topics   Alcohol use: No   Drug use: No   Social History   Socioeconomic History   Marital status: Widowed    Spouse name: Not on file   Number of children: 1   Years of education: hs   Highest  education level: Not on file  Occupational History   Occupation: Retired    Fish farm manager: RETIRED  Tobacco Use   Smoking status: Every Day    Packs/day: 0.25    Years: 60.00    Total pack years: 15.00    Types: Cigarettes   Smokeless tobacco: Never   Tobacco comments:    using patch  Vaping Use   Vaping Use: Never used  Substance and Sexual Activity   Alcohol use: No   Drug use: No   Sexual activity: Not Currently  Other Topics Concern   Not on file  Social History Narrative   Patient drinks 1-2 cups of caffeine daily.   Patient is right handed.   Lives with his sister [name]   Has a nurse/ Caregiver, Archer to help with bed baths, dressing, meals, etc 5 days per week   Social Determinants of Health   Financial Resource Strain: Low Risk  (08/31/2020)   Overall Financial Resource Strain (CARDIA)    Difficulty of Paying Living Expenses: Not very hard  Food Insecurity: No Food Insecurity (08/31/2020)   Hunger Vital Sign    Worried About Running Out of Food in the Last Year: Never true    Ran Out of Food in the Last Year: Never true  Transportation Needs: No Transportation Needs (08/31/2020)   PRAPARE - Hydrologist (Medical): No    Lack of Transportation (Non-Medical): No  Physical Activity: Inactive (08/31/2020)   Exercise Vital Sign    Days of Exercise per Week: 0 days    Minutes of Exercise per Session: 0 min  Stress: No Stress Concern Present (08/31/2020)   [place]    Feeling of Stress : Not at all  Social Connections: Moderately Isolated (08/31/2020)   Social Connection and Isolation Panel [NHANES]    Frequency of Communication with Friends and Family: Twice a week    Frequency of Social Gatherings with Friends and Family: More than three times a week    Attends Religious Services: More than 4 times per year    Active Member of [employer] or Organizations: No     Attends Archivist Meetings: Never    Marital Status: Widowed  Intimate Partner Violence: Not At Risk (08/31/2020)   Humiliation, Afraid, Rape, and Kick questionnaire    Fear of Current or Ex-Partner: No    Emotionally Abused: No    Physically Abused: No    Sexually Abused: No   Family Status  Relation Name Status   Mother  Deceased at age 46   Father  Deceased at age 30       unknown   Sister  Alive   Brother  Alive   Brother  Alive   Neg Hx  (Not Specified)   Family History  Problem Relation Age of Onset   Cancer Mother    Dementia Father    Colon cancer Neg Hx    Allergies  Allergen Reactions   Sulfa Antibiotics       Review of Systems  Constitutional: Negative.  Negative for chills and fever.  HENT:  Positive for congestion.   Eyes: Negative.   Respiratory:  Positive for cough.   Cardiovascular: Negative.   Gastrointestinal:  Negative for abdominal pain.  Genitourinary: Negative.   Musculoskeletal:        Patient is dependent on wheel chair  Skin: Negative.  Negative for itching and rash.  All other systems reviewed and are negative.     Objective:     BP 107/66   Pulse 88   Temp 98.6 F (37 C)   Ht '5\' 9"'$  (1.753 m)   Wt 125 lb (56.7 kg)   SpO2 95%   BMI 18.46 kg/m  BP Readings from Last 3 Encounters:  10/14/21 107/66  09/30/21 130/61  09/14/21 129/74   Wt Readings from Last 3 Encounters:  10/14/21 125 lb (56.7 kg)  09/28/21 163 lb 2.3 oz (74 kg)  09/14/21 162 lb (73.5 kg)      Physical Exam Vitals and nursing note reviewed.  Constitutional:      Appearance: Normal appearance.  HENT:     Head: Normocephalic.     Right Ear: External ear normal.     Left Ear: External ear normal.     Nose: Congestion present.  Eyes:     Pupils: Pupils are equal, round, and reactive to light.  Cardiovascular:     Rate and Rhythm: Normal rate and regular rhythm.  Pulses: Normal pulses.     Heart sounds: Normal heart sounds.  Pulmonary:      Effort: Pulmonary effort is normal.     Breath sounds: Wheezing present.  Abdominal:     General: Bowel sounds are normal.  Skin:    General: Skin is warm.     Findings: Rash present. No erythema.          Comments: Bilateral inner thigh rash  Neurological:     General: No focal deficit present.     Mental Status: He is alert.  Psychiatric:        Mood and Affect: Mood normal.        Behavior: Behavior normal.      No results found for any visits on 10/14/21.  Last CBC Lab Results  Component Value Date   WBC 8.1 09/28/2021   HGB 13.2 09/28/2021   HCT 40.9 09/28/2021   MCV 95.3 09/28/2021   MCH 30.8 09/28/2021   RDW 14.4 09/28/2021   PLT 220 81/82/9937   Last metabolic panel Lab Results  Component Value Date   GLUCOSE 106 (H) 09/28/2021   NA 139 09/28/2021   K 4.3 09/28/2021   CL 107 09/28/2021   CO2 25 09/28/2021   BUN 14 09/28/2021   CREATININE 0.88 09/28/2021   GFRNONAA >60 09/28/2021   CALCIUM 8.8 (L) 09/28/2021   PROT 7.5 08/24/2021   ALBUMIN 3.1 (L) 08/24/2021   LABGLOB 3.1 07/12/2021   AGRATIO 1.3 07/12/2021   BILITOT 0.7 08/24/2021   ALKPHOS 63 08/24/2021   AST 39 08/24/2021   ALT 17 08/24/2021   ANIONGAP 7 09/28/2021   Last lipids Lab Results  Component Value Date   CHOL 113 07/12/2021   HDL 56 07/12/2021   LDLCALC 41 07/12/2021   TRIG 83 07/12/2021   CHOLHDL 2.0 07/12/2021   Last hemoglobin A1c Lab Results  Component Value Date   HGBA1C 5.9 (H) 12/23/2019   Last thyroid functions Lab Results  Component Value Date   TSH 1.53 11/20/2018   Last vitamin D Lab Results  Component Value Date   VD25OH 38.0 07/12/2021   Last vitamin B12 and Folate Lab Results  Component Value Date   VITAMINB12 1,807 (H) 12/23/2019      The ASCVD Risk score (Arnett DK, et al., 2019) failed to calculate for the following reasons:   The 2019 ASCVD risk score is only valid for ages 57 to 37   The patient has a prior MI or stroke diagnosis     Assessment & Plan:  Provided education to patient and caregiver to please increase hydration, reduce sodium in diet as bacon is a huge part of patient's meal.  Discontinue blood pressure medication as advised from the hospital.  Cut Ativan to half tablet by mouth at bedtime.  Keep a blood pressure log daily.  Follow-up with unresolved symptoms. Problem List Items Addressed This Visit       Cardiovascular and Mediastinum   Essential hypertension    Patient continues to experience hypotension.  Continue to hold lisinopril and Norvasc.  Decrease in diet, increase hydration, follow-up with unresolved symptoms.        Digestive   GERD - Primary    Symptoms well controlled no changes necessary.      Other Visit Diagnoses     Hypotension due to hypovolemia       Rash       Relevant Medications   clotrimazole-betamethasone (LOTRISONE) cream  Return if symptoms worsen or fail to improve.    Ivy Lynn, NP

## 2021-10-14 NOTE — Assessment & Plan Note (Signed)
Patient continues to experience hypotension.  Continue to hold lisinopril and Norvasc.  Decrease in diet, increase hydration, follow-up with unresolved symptoms.

## 2021-10-14 NOTE — Addendum Note (Signed)
Addended by: Ivy Lynn on: 10/14/2021 03:03 PM   Modules accepted: Orders

## 2021-10-14 NOTE — Assessment & Plan Note (Signed)
Symptoms well controlled no changes necessary.

## 2021-10-21 ENCOUNTER — Other Ambulatory Visit: Payer: Self-pay | Admitting: Nurse Practitioner

## 2021-10-21 DIAGNOSIS — F5104 Psychophysiologic insomnia: Secondary | ICD-10-CM

## 2021-11-10 ENCOUNTER — Telehealth: Payer: Self-pay | Admitting: Nurse Practitioner

## 2021-11-10 ENCOUNTER — Other Ambulatory Visit: Payer: Self-pay | Admitting: Nurse Practitioner

## 2021-11-10 DIAGNOSIS — R300 Dysuria: Secondary | ICD-10-CM

## 2021-11-10 NOTE — Telephone Encounter (Signed)
Patient can leave urine sample for UTI, I will add orders.

## 2021-11-11 NOTE — Telephone Encounter (Signed)
Called and left vm for cb

## 2021-11-14 ENCOUNTER — Ambulatory Visit (INDEPENDENT_AMBULATORY_CARE_PROVIDER_SITE_OTHER): Payer: Medicare Other | Admitting: Nurse Practitioner

## 2021-11-14 ENCOUNTER — Encounter: Payer: Self-pay | Admitting: Nurse Practitioner

## 2021-11-14 VITALS — BP 142/75 | HR 77 | Temp 98.7°F | Ht 69.0 in | Wt 120.0 lb

## 2021-11-14 DIAGNOSIS — R300 Dysuria: Secondary | ICD-10-CM

## 2021-11-14 DIAGNOSIS — Z23 Encounter for immunization: Secondary | ICD-10-CM

## 2021-11-14 LAB — URINALYSIS, ROUTINE W REFLEX MICROSCOPIC
Bilirubin, UA: NEGATIVE
Glucose, UA: NEGATIVE
Ketones, UA: NEGATIVE
Leukocytes,UA: NEGATIVE
Nitrite, UA: NEGATIVE
RBC, UA: NEGATIVE
Specific Gravity, UA: 1.02 (ref 1.005–1.030)
Urobilinogen, Ur: 1 mg/dL (ref 0.2–1.0)
pH, UA: 6 (ref 5.0–7.5)

## 2021-11-14 LAB — MICROSCOPIC EXAMINATION: Renal Epithel, UA: NONE SEEN /hpf

## 2021-11-14 NOTE — Patient Instructions (Signed)

## 2021-11-14 NOTE — Progress Notes (Signed)
Acute Office Visit  Subjective:     Patient ID: [name], male    DOB: December 10, 1934, 87 y.o.   MRN: [id number]  No chief complaint on file.   Dysuria  This is a new problem. The current episode started in the past 7 days. The problem occurs every urination. The problem has been unchanged. The quality of the pain is described as aching and burning. The pain is moderate. There has been no fever. He is Not sexually active. Associated symptoms include hesitancy. Pertinent negatives include no chills, discharge, flank pain, hematuria or sweats. He has tried nothing for the symptoms. His past medical history is significant for recurrent UTIs.     Review of Systems  Constitutional: Negative.  Negative for chills and fever.  HENT: Negative.    Respiratory: Negative.    Cardiovascular: Negative.   Genitourinary:  Positive for dysuria and hesitancy. Negative for flank pain and hematuria.  Skin: Negative.  Negative for itching and rash.  Neurological: Negative.   All other systems reviewed and are negative.       Objective:    BP (!) 142/75   Pulse 77   Temp 98.7 F (37.1 C)   Ht _0  (1.753 m)   Wt 120 lb (54.4 kg)   SpO2 99%   BMI 17.72 kg/m  Wt Readings from Last 3 Encounters:  11/14/21 120 lb (54.4 kg)  10/14/21 125 lb (56.7 kg)  09/28/21 163 lb 2.3 oz (74 kg)      Physical Exam Vitals and nursing note reviewed.  Constitutional:      Appearance: Normal appearance.  HENT:     Head: Normocephalic.     Right Ear: External ear normal.     Left Ear: External ear normal.     Nose: Nose normal.     Mouth/Throat:     Mouth: Mucous membranes are moist.     Pharynx: Oropharynx is clear.  Eyes:     Conjunctiva/sclera: Conjunctivae normal.     Pupils: Pupils are equal, round, and reactive to light.  Cardiovascular:     Rate and Rhythm: Normal rate and regular rhythm.     Pulses: Normal pulses.     Heart sounds: Normal heart sounds.  Pulmonary:     Effort: Pulmonary  effort is normal.     Breath sounds: Normal breath sounds.  Abdominal:     General: Bowel sounds are normal.     Tenderness: There is abdominal tenderness. There is no right CVA tenderness or left CVA tenderness.  Neurological:     Mental Status: He is alert and oriented to person, place, and time.     No results found for any visits on 11/14/21.      Assessment & Plan:  Patient presents with decreased urination and dysuria in the past 7 days.  I will complete GFR to assess patient's kidney functions. UTI  vs  interstitial cystitis -urinalysis completed results pending -pyridium for pain Precaution and education provided -All questions answered -follow up with unresolved symptoms    Problem List Items Addressed This Visit   None Visit Diagnoses     Dysuria    -  Primary   Relevant Orders   Urinalysis, Routine w reflex microscopic   CULTURE, URINE COMPREHENSIVE   CMP14+EGFR   Need for immunization against influenza       Relevant Orders   Flu Vaccine QUAD High Dose(Fluad) (Completed)       No orders of the defined types  were placed in this encounter.   Return if symptoms worsen or fail to improve.   Ivy Lynn, NP

## 2021-11-15 LAB — CMP14+EGFR
ALT: 9 IU/L (ref 0–44)
AST: 18 IU/L (ref 0–40)
Albumin/Globulin Ratio: 1.2 (ref 1.2–2.2)
Albumin: 3.9 g/dL (ref 3.7–4.7)
Alkaline Phosphatase: 94 IU/L (ref 44–121)
BUN/Creatinine Ratio: 10 (ref 10–24)
BUN: 9 mg/dL (ref 8–27)
Bilirubin Total: 0.5 mg/dL (ref 0.0–1.2)
CO2: 24 mmol/L (ref 20–29)
Calcium: 9.1 mg/dL (ref 8.6–10.2)
Chloride: 103 mmol/L (ref 96–106)
Creatinine, Ser: 0.9 mg/dL (ref 0.76–1.27)
Globulin, Total: 3.3 g/dL (ref 1.5–4.5)
Glucose: 80 mg/dL (ref 70–99)
Potassium: 3.9 mmol/L (ref 3.5–5.2)
Sodium: 140 mmol/L (ref 134–144)
Total Protein: 7.2 g/dL (ref 6.0–8.5)
eGFR: 84 mL/min/{1.73_m2} (ref >59)

## 2021-11-16 LAB — CULTURE, URINE COMPREHENSIVE

## 2021-11-17 ENCOUNTER — Other Ambulatory Visit: Payer: Self-pay | Admitting: Nurse Practitioner

## 2021-11-17 DIAGNOSIS — N342 Other urethritis: Secondary | ICD-10-CM

## 2021-11-17 MED ORDER — NITROFURANTOIN MONOHYD MACRO 100 MG PO CAPS: 100.0000 mg | ORAL_CAPSULE | Freq: Two times a day (BID) | ORAL | 0 refills | Status: DC

## 2021-11-17 NOTE — Progress Notes (Signed)
Returning call. Please call back [phone]

## 2021-11-22 ENCOUNTER — Other Ambulatory Visit: Payer: Self-pay | Admitting: Nurse Practitioner

## 2021-11-23 ENCOUNTER — Other Ambulatory Visit: Payer: Self-pay | Admitting: Nurse Practitioner

## 2021-11-23 NOTE — Telephone Encounter (Signed)
Visit has been completed

## 2021-12-20 DIAGNOSIS — B351 Tinea unguium: Secondary | ICD-10-CM | POA: Diagnosis not present

## 2021-12-20 DIAGNOSIS — L110 Acquired keratosis follicularis: Secondary | ICD-10-CM | POA: Diagnosis not present

## 2021-12-20 DIAGNOSIS — I739 Peripheral vascular disease, unspecified: Secondary | ICD-10-CM | POA: Diagnosis not present

## 2021-12-22 ENCOUNTER — Other Ambulatory Visit: Payer: Self-pay | Admitting: Nurse Practitioner

## 2021-12-22 DIAGNOSIS — F419 Anxiety disorder, unspecified: Secondary | ICD-10-CM

## 2021-12-23 NOTE — Telephone Encounter (Signed)
Lorazepam refill denied due to controlled substance. Schedule pt in person office visit for refills.

## 2021-12-30 ENCOUNTER — Encounter: Payer: Self-pay | Admitting: Nurse Practitioner

## 2021-12-30 ENCOUNTER — Ambulatory Visit (INDEPENDENT_AMBULATORY_CARE_PROVIDER_SITE_OTHER): Payer: Medicare Other | Admitting: Nurse Practitioner

## 2021-12-30 VITALS — BP 132/77 | HR 68 | Temp 98.7°F | Ht 69.0 in | Wt 120.0 lb

## 2021-12-30 DIAGNOSIS — R21 Rash and other nonspecific skin eruption: Secondary | ICD-10-CM | POA: Diagnosis not present

## 2021-12-30 DIAGNOSIS — F419 Anxiety disorder, unspecified: Secondary | ICD-10-CM | POA: Diagnosis not present

## 2021-12-30 DIAGNOSIS — R300 Dysuria: Secondary | ICD-10-CM

## 2021-12-30 LAB — URINALYSIS, ROUTINE W REFLEX MICROSCOPIC
Bilirubin, UA: NEGATIVE
Glucose, UA: NEGATIVE
Ketones, UA: NEGATIVE
Leukocytes,UA: NEGATIVE
Nitrite, UA: NEGATIVE
Protein,UA: NEGATIVE
Specific Gravity, UA: 1.015 (ref 1.005–1.030)
Urobilinogen, Ur: 2 mg/dL — ABNORMAL HIGH (ref 0.2–1.0)
pH, UA: 7 (ref 5.0–7.5)

## 2021-12-30 LAB — MICROSCOPIC EXAMINATION
Bacteria, UA: NONE SEEN
Epithelial Cells (non renal): NONE SEEN /hpf (ref 0–10)
Renal Epithel, UA: NONE SEEN /hpf
WBC, UA: NONE SEEN /hpf (ref 0–5)

## 2021-12-30 MED ORDER — LORAZEPAM 1 MG PO TABS: 1.0000 mg | ORAL_TABLET | Freq: Every day | ORAL | 0 refills | Status: DC

## 2021-12-30 MED ORDER — CLOTRIMAZOLE-BETAMETHASONE 1-0.05 % EX CREA: 1.0000 | TOPICAL_CREAM | Freq: Every day | CUTANEOUS | 0 refills | Status: DC

## 2021-12-30 NOTE — Assessment & Plan Note (Addendum)
Symptoms are well controlled with current medication, no changes needed. Continue on current medication. Follow with unresolved symptoms

## 2021-12-30 NOTE — Patient Instructions (Signed)

## 2021-12-30 NOTE — Progress Notes (Signed)
Established Patient Office Visit  Subjective   Patient ID: [name], male    DOB: [birth year]  Age: 87 y.o. MRN: [id number]  Chief Complaint  Patient presents with   Medication Refill   Dysuria    Dysuria  This is a new problem. The current episode started yesterday. The problem occurs every urination. The problem has been unchanged. The quality of the pain is described as aching. The pain is mild. There has been no fever. He is Not sexually active. There is No history of pyelonephritis. Pertinent negatives include no chills, flank pain, hematuria, nausea or vomiting. He has tried nothing for the symptoms.  Anxiety Presents for follow-up visit. Symptoms include decreased concentration and depressed mood. Patient reports no chest pain, compulsions, confusion, feeling of choking, irritability, nausea or nervous/anxious behavior. Symptoms occur constantly. The severity of symptoms is moderate. The quality of sleep is fair.      Patient Active Problem List   Diagnosis Date Noted   Pre-syncope 09/28/2021   Acute respiratory failure with hypoxia ([place]) 08/24/2021   AKI (acute kidney injury) ([place]) 62/95/2841   Acute metabolic encephalopathy 32/44/0102   Anxiety 01/07/2021   Elevated PSA 10/28/2020   Urinary retention 10/25/2020   Abnormal urine odor 04/21/2020   Depression, recurrent ([place]) 04/21/2020   Cerebrovascular accident (CVA) due to embolism of left middle cerebral artery ([place]) 12/14/2018   Abnormal findings on diagnostic imaging of lung 08/19/2018   IPF (idiopathic pulmonary fibrosis) ([place]) 08/19/2018   OSA (obstructive sleep apnea) 08/19/2018   Carotid artery disease ([place]) 03/26/2017   Internal carotid artery stenosis, left 03/06/2017   Dysphagia 03/06/2017   Pulmonary nodules 03/06/2017   Seizure ([place]) 03/03/2017   Protein-calorie malnutrition ([place]) 01/10/2017   Chronic pain 10/02/2016   Depression, major, single episode, moderate ([place]) 08/31/2016   Chronic insomnia  08/31/2016   Lumbar spinal stenosis 08/18/2016   Wheelchair dependence 08/18/2016   Dementia ([place]) 08/18/2016   Urinary incontinence 08/18/2016   Polypharmacy 08/18/2016   Laryngopharyngeal reflux (LPR) 08/12/2015   Memory difficulty 08/04/2014   Numbness and tingling of right arm 08/04/2014   Abnormality of gait 08/01/2013   Anemia 12/05/2012   Early satiety 12/03/2012   History of colon polyps 12/03/2012   Facial droop 10/12/2012   Spinal stenosis of cervical region 07/01/2012   GERD 05/27/2009   DYSPHAGIA UNSPECIFIED 05/27/2009   CHANGE IN BOWELS 05/27/2009   SMOKER 09/09/2008   Essential hypertension 09/09/2008   History of stroke 09/09/2008   Constipation 09/09/2008   HIGH BLOOD PRESSURE 07/25/2006   Past Medical History:  Diagnosis Date   Abnormality of gait 08/01/2013   Arthritis    Blindness of left eye    Posttraumatic   Carotid artery disease (H[facility])    Colon polyp    Constipation    Diaphragmatic hernia    Disc disease, degenerative, cervical    Diverticulitis    ED (erectile dysfunction)    Gastroparesis    GERD (gastroesophageal reflux disease)    Glaucoma    Heart disease    Hyperlipidemia    Hypertension    Insomnia    Memory difficulty 08/04/2014   Nicotine dependence    Numbness and tingling of right arm 08/04/2014   Osteoarthritis    Overactive bladder    Radiculopathy    Shortness of breath    with activity   Sleep apnea    does not use CPAP.  Tested maybe 5- 10 years ago   Stroke [facility])  Left side weakness.   Unilateral inguinal hernia    Past Surgical History:  Procedure Laterality Date   ANTERIOR CERVICAL DECOMP/DISCECTOMY FUSION N/A 07/01/2012   Procedure: ANTERIOR CERVICAL DECOMPRESSION/DISCECTOMY FUSION CERVICAL FIVE-SIX [name] REMOVAL CERVICAL SIX-SEVEN;  Surgeon: [name], MD;  Location: [place] [facility];  Service: Neurosurgery;  Laterality: N/A;   CERVICAL FUSION     COLONOSCOPY   08/04/2002     RMR: Normal rectum/Pancolonic  diverticula/Colonic polyps as described above, biopsied and/or snared   COLONOSCOPY  60/08/2009   RMR: normal rectum/left and right sided diverticula/multiple colonic polyps. tubular adenomas. surveillance due 2014   COLONOSCOPY WITH ESOPHAGOGASTRODUODENOSCOPY (EGD) N/A 12/18/2012   Dr. [name]:  Colonic diverticulosis. Multiple colonic polyps-hyperplastic polyps and tubular adenoma. Friable anal canal hemorrhoids. EGD with chronic atrophic gastritis   ESOPHAGOGASTRODUODENOSCOPY  04/14/2002   UPJ:SRPRXYVO'P' ring, status post dilation as described above/Hiatal hernia, focal antral erosions of uncertain clinical significance   ESOPHAGOGASTRODUODENOSCOPY  06/09/2009   RMR: normal esophagus s/p dilator/small hiatal hernia otherwise normal   HERNIA REPAIR Right    inguinal   I & D EXTREMITY Right 04/23/2017   Procedure: [place] HAND;  Surgeon: [name], MD;  Location: [facility];  Service: Orthopedics;  Laterality: Right;   INGUINAL HERNIA REPAIR Left 04/15/2014   Procedure: LEFT INGUINAL HERNIORRHAPHY WITH MESH;  Surgeon: [name], MD;  Location: [facility];  Service: General;  Laterality: Left;   INSERTION OF MESH Left 04/15/2014   Procedure: INSERTION OF MESH;  Surgeon: [name], MD;  Location: [facility];  Service: General;  Laterality: Left;   Social History   Tobacco Use   Smoking status: Every Day    Packs/day: 0.25    Years: 60.00    Total pack years: 15.00    Types: Cigarettes   Smokeless tobacco: Never   Tobacco comments:    using patch  Vaping Use   Vaping Use: Never used  Substance Use Topics   Alcohol use: No   Drug use: No   Social History   Socioeconomic History   Marital status: Widowed    Spouse name: Not on file   Number of children: 1   Years of education: hs   Highest education level: Not on file  Occupational History   Occupation: Retired    Fish farm manager: RETIRED  Tobacco Use   Smoking status: Every Day    Packs/day: 0.25    Years:  60.00    Total pack years: 15.00    Types: Cigarettes   Smokeless tobacco: Never   Tobacco comments:    using patch  Vaping Use   Vaping Use: Never used  Substance and Sexual Activity   Alcohol use: No   Drug use: No   Sexual activity: Not Currently  Other Topics Concern   Not on file  Social History Narrative   Patient drinks 1-2 cups of caffeine daily.   Patient is right handed.   Lives with his sister [name]   Has a nurse/ Caregiver, [name] Scales to help with bed baths, dressing, meals, etc 5 days per week   Social Determinants of Health   Financial Resource Strain: Low Risk  (08/31/2020)   Overall Financial Resource Strain (CARDIA)    Difficulty of Paying Living Expenses: Not very hard  Food Insecurity: No Food Insecurity (08/31/2020)   Hunger Vital Sign    Worried About Running Out of Food in the Last Year: Never true    Ran Out of Food  in the Last Year: Never true  Transportation Needs: No Transportation Needs (08/31/2020)   PRAPARE - Hydrologist (Medical): No    Lack of Transportation (Non-Medical): No  Physical Activity: Inactive (08/31/2020)   Exercise Vital Sign    Days of Exercise per Week: 0 days    Minutes of Exercise per Session: 0 min  Stress: No Stress Concern Present (08/31/2020)   [place]    Feeling of Stress : Not at all  Social Connections: Moderately Isolated (08/31/2020)   Social Connection and Isolation Panel [NHANES]    Frequency of Communication with Friends and Family: Twice a week    Frequency of Social Gatherings with Friends and Family: More than three times a week    Attends Religious Services: More than 4 times per year    Active Member of [employer] or Organizations: No    Attends Archivist Meetings: Never    Marital Status: Widowed  Intimate Partner Violence: Not At Risk (08/31/2020)   Humiliation, Afraid, Rape, and Kick  questionnaire    Fear of Current or Ex-Partner: No    Emotionally Abused: No    Physically Abused: No    Sexually Abused: No   Family Status  Relation Name Status   Mother  Deceased at age 79   Father  Deceased at age 66       unknown   Sister  Alive   Brother  Alive   Brother  Alive   Neg Hx  (Not Specified)   Family History  Problem Relation Age of Onset   Cancer Mother    Dementia Father    Colon cancer Neg Hx    Allergies  Allergen Reactions   Sulfa Antibiotics       Review of Systems  Constitutional: Negative.  Negative for chills and irritability.  HENT: Negative.  Negative for ear pain.   Respiratory: Negative.    Cardiovascular:  Negative for chest pain.  Gastrointestinal:  Negative for nausea and vomiting.  Genitourinary:  Positive for dysuria. Negative for flank pain and hematuria.  Skin:  Positive for rash.  Neurological: Negative.   Psychiatric/Behavioral:  Positive for decreased concentration. Negative for confusion. The patient is not nervous/anxious.   All other systems reviewed and are negative.     Objective:     BP 132/77   Pulse 68   Temp 98.7 F (37.1 C)   Ht _0  (1.753 m)   Wt 120 lb (54.4 kg)   SpO2 98%   BMI 17.72 kg/m  BP Readings from Last 3 Encounters:  12/30/21 132/77  11/14/21 (!) 142/75  10/14/21 107/66   Wt Readings from Last 3 Encounters:  12/30/21 120 lb (54.4 kg)  11/14/21 120 lb (54.4 kg)  10/14/21 125 lb (56.7 kg)      Physical Exam Vitals and nursing note reviewed.  Constitutional:      Appearance: Normal appearance.  HENT:     Head: Normocephalic.     Right Ear: External ear normal.     Left Ear: External ear normal.     Nose: Nose normal.     Mouth/Throat:     Mouth: Mucous membranes are moist.     Pharynx: Oropharynx is clear.  Eyes:     Conjunctiva/sclera: Conjunctivae normal.  Cardiovascular:     Rate and Rhythm: Normal rate and regular rhythm.     Pulses: Normal pulses.     Heart  sounds:  Normal heart sounds.  Pulmonary:     Effort: Pulmonary effort is normal.     Breath sounds: Normal breath sounds.  Abdominal:     General: Bowel sounds are normal.     Tenderness: There is no right CVA tenderness or left CVA tenderness.  Musculoskeletal:     Comments: Patient is wheel chair dependent  Skin:    General: Skin is warm.     Findings: No erythema or rash.  Neurological:     General: No focal deficit present.     Mental Status: He is alert and oriented to person, place, and time.      No results found for any visits on 12/30/21.  Last CBC Lab Results  Component Value Date   WBC 8.1 09/28/2021   HGB 13.2 09/28/2021   HCT 40.9 09/28/2021   MCV 95.3 09/28/2021   MCH 30.8 09/28/2021   RDW 14.4 09/28/2021   PLT 220 76/16/0737   Last metabolic panel Lab Results  Component Value Date   GLUCOSE 80 11/14/2021   NA 140 11/14/2021   K 3.9 11/14/2021   CL 103 11/14/2021   CO2 24 11/14/2021   BUN 9 11/14/2021   CREATININE 0.90 11/14/2021   EGFR 84 11/14/2021   CALCIUM 9.1 11/14/2021   PROT 7.2 11/14/2021   ALBUMIN 3.9 11/14/2021   LABGLOB 3.3 11/14/2021   AGRATIO 1.2 11/14/2021   BILITOT 0.5 11/14/2021   ALKPHOS 94 11/14/2021   AST 18 11/14/2021   ALT 9 11/14/2021   ANIONGAP 7 09/28/2021   Last lipids Lab Results  Component Value Date   CHOL 113 07/12/2021   HDL 56 07/12/2021   LDLCALC 41 07/12/2021   TRIG 83 07/12/2021   CHOLHDL 2.0 07/12/2021   Last hemoglobin A1c Lab Results  Component Value Date   HGBA1C 5.9 (H) 12/23/2019   Last thyroid functions Lab Results  Component Value Date   TSH 1.53 11/20/2018   Last vitamin D Lab Results  Component Value Date   VD25OH 38.0 07/12/2021   Last vitamin B12 and Folate Lab Results  Component Value Date   VITAMINB12 1,807 (H) 12/23/2019      The ASCVD Risk score (Arnett DK, et al., 2019) failed to calculate for the following reasons:   The 2019 ASCVD risk score is only valid for ages 17 to  49   The patient has a prior MI or stroke diagnosis    Assessment & Plan:   UTI  vs  interstitial cystitis -urinalysis completed results pending -pyridium for pain Precaution and education provided -All questions answered -follow up with unresolved symptoms   Problem List Items Addressed This Visit       Other   Anxiety    Symptoms are well controlled with current medication, no changes needed. Continue on current medication. Follow with unresolved symptoms      Relevant Medications   LORazepam (ATIVAN) 1 MG tablet   Other Visit Diagnoses     Dysuria    -  Primary   Relevant Orders   Urinalysis, Routine w reflex microscopic   CULTURE, URINE COMPREHENSIVE   Rash       Relevant Medications   clotrimazole-betamethasone (LOTRISONE) cream       Return in about 3 months (around 03/31/2022) for chronic disease management.    Ivy Lynn, NP

## 2022-01-04 LAB — CULTURE, URINE COMPREHENSIVE

## 2022-01-20 DIAGNOSIS — I10 Essential (primary) hypertension: Secondary | ICD-10-CM | POA: Diagnosis not present

## 2022-01-20 DIAGNOSIS — J449 Chronic obstructive pulmonary disease, unspecified: Secondary | ICD-10-CM | POA: Diagnosis not present

## 2022-02-20 DIAGNOSIS — J449 Chronic obstructive pulmonary disease, unspecified: Secondary | ICD-10-CM | POA: Diagnosis not present

## 2022-02-20 DIAGNOSIS — I10 Essential (primary) hypertension: Secondary | ICD-10-CM | POA: Diagnosis not present

## 2022-02-22 ENCOUNTER — Other Ambulatory Visit: Payer: Self-pay | Admitting: Urology

## 2022-02-28 ENCOUNTER — Encounter: Payer: Self-pay | Admitting: Family Medicine

## 2022-02-28 ENCOUNTER — Ambulatory Visit (INDEPENDENT_AMBULATORY_CARE_PROVIDER_SITE_OTHER): Payer: 59 | Admitting: Family Medicine

## 2022-02-28 VITALS — BP 125/74 | HR 73 | Temp 98.4°F | Ht 69.0 in | Wt 120.0 lb

## 2022-02-28 DIAGNOSIS — F339 Major depressive disorder, recurrent, unspecified: Secondary | ICD-10-CM

## 2022-02-28 DIAGNOSIS — R569 Unspecified convulsions: Secondary | ICD-10-CM | POA: Diagnosis not present

## 2022-02-28 DIAGNOSIS — F17200 Nicotine dependence, unspecified, uncomplicated: Secondary | ICD-10-CM

## 2022-02-28 DIAGNOSIS — G959 Disease of spinal cord, unspecified: Secondary | ICD-10-CM | POA: Insufficient documentation

## 2022-02-28 DIAGNOSIS — J84112 Idiopathic pulmonary fibrosis: Secondary | ICD-10-CM

## 2022-02-28 DIAGNOSIS — E639 Nutritional deficiency, unspecified: Secondary | ICD-10-CM | POA: Diagnosis not present

## 2022-02-28 DIAGNOSIS — R239 Unspecified skin changes: Secondary | ICD-10-CM | POA: Diagnosis not present

## 2022-02-28 DIAGNOSIS — Z79899 Other long term (current) drug therapy: Secondary | ICD-10-CM

## 2022-02-28 DIAGNOSIS — J449 Chronic obstructive pulmonary disease, unspecified: Secondary | ICD-10-CM | POA: Insufficient documentation

## 2022-02-28 DIAGNOSIS — Z136 Encounter for screening for cardiovascular disorders: Secondary | ICD-10-CM | POA: Diagnosis not present

## 2022-02-28 DIAGNOSIS — N3944 Nocturnal enuresis: Secondary | ICD-10-CM

## 2022-02-28 DIAGNOSIS — F0390 Unspecified dementia without behavioral disturbance: Secondary | ICD-10-CM | POA: Diagnosis not present

## 2022-02-28 DIAGNOSIS — F419 Anxiety disorder, unspecified: Secondary | ICD-10-CM

## 2022-02-28 DIAGNOSIS — R6889 Other general symptoms and signs: Secondary | ICD-10-CM | POA: Diagnosis not present

## 2022-02-28 DIAGNOSIS — K2100 Gastro-esophageal reflux disease with esophagitis, without bleeding: Secondary | ICD-10-CM

## 2022-02-28 DIAGNOSIS — R269 Unspecified abnormalities of gait and mobility: Secondary | ICD-10-CM

## 2022-02-28 DIAGNOSIS — E46 Unspecified protein-calorie malnutrition: Secondary | ICD-10-CM

## 2022-02-28 DIAGNOSIS — F5104 Psychophysiologic insomnia: Secondary | ICD-10-CM

## 2022-02-28 MED ORDER — LORAZEPAM 1 MG PO TABS: 1.0000 mg | ORAL_TABLET | Freq: Every day | ORAL | 0 refills | Status: DC

## 2022-02-28 NOTE — Progress Notes (Signed)
New Patient Office Visit  Subjective   Patient ID: [name], male    DOB: 1934-07-13  Age: 88 y.o. MRN: [id number]  CC:  Chief Complaint  Patient presents with   Establish Care    Lorazepam refill Pressure ulcer    HPI [name] presents to establish care Would like refill of medications and examination of pressure ulcer.  Presents with CNA who has been caring for him for 10 years.  Lives with sister and nephew, CNA states sister is not well either.   Pressure sore CNA has been trying A&D ointment and cream with iodine. Pt does not walk. Pt stand and pivots from wheelchair to recliner and to bed.  Noticed it 2 weeks ago. States it is small right now. On the side of the left buttock cheek.. uses pillow in wheelchair and in recliner, but does not have one in car.   Pulmonary Fibrosis  Currently uses inhaler and completes breathing treatments. Continues to smoke. Has not seen pulmonology since 2021. Per 2021 note, pt has pulmonary fibrosis and severe OSA. Intolerant of BiPaP. Per note, CT in October of 2020 showed progression of fibrosis. Per note, requested he follow up with ILD and OSA clinic.   History of CVA 12/2018 CNA states they do not know why they are taking ASA 325 mg. Pt is not on DAPT or other antiplatelet.   Seizures  Had seizures years ago, but has not had any since he has been on medication per CNA.   GERD/Dysphagia  Takes Prilosec. Does not complain of heart burn. Only complains of not going to the bathroom.  EGD in 2012 showing Schatzki's ring s/p dilation, hiatal hernia, focal antral erosions.  Swallow study completed in   Nocturnal enuresis  Last seen urology 09/2021 - recommended d/c myrbetriq due to lack of efficacy  Does not see them often.  Still urinating at night. Has been taking the increased dose of Myrbetriq. Is trying to increase hydration during the day.   Depression/Anxiety Gets anxious. Sits at the edge of the chair and blows. Likes to go  outside and smoke a cigarette. At one time he wanted to leave his sister's house, then would change his mind.  Takes Lorazepam at 4 pm daily for anxiety. Takes Trazodone at night  ~7 pm for sleep. Goes to sleep around 7 pm. Gets up in the middle of the night and lays there. Usually wakes up around 2-3 in the morning. Per CNA, the nephew states he hears the patient talking in the night usually to his wife's picture (wife passed away 3 years ago). Unable to complete PHQ9 due to cognitive ability  Poor Nutrition Previously taking Vitamin B12 and Vitamin B. Per CNA she believes he is still taking these. Does not have BM daily. Does not drink a lot of water per CNA. States that she tries to encourage hydration, but that he does not partake. Drinks boost once a day. Pt is concerned that he does not have daily BM. He takes Amitiza daily.   Hypertension Previous provider discontinued BP medications due to syncopal episodes.   Tobacco Use Disorder  Pt continues to smoke. States that it helps with his anxiety. Does not wish to quit at this time. Does not have oxygen at home per CNA   Abnormal Gait  Pt is currently wheelchair bound. Requires assistance with all ADLs. Pt requires assistance with transfers. Is able to stand & pivot to bed and recliner with significant assistance per CNA  with pt.    Chronic Care Coordination  Receives medications through delivery service with [facility]  Referral previously placed for palliative care, but they have not contacted palliative care. Per CNA, pt and family weary of new people coming into the home. States he is more comfortable with ambulatory care.  Current supports include CNA: [name] [phone] Sister and nephew whom he lives with Niece is the POA: [name] [id number]   Outpatient Encounter Medications as of 02/28/2022  Medication Sig   albuterol (PROVENTIL) (2.5 MG/3ML) 0.083% nebulizer solution Take 2.5 mg by nebulization daily as needed for  wheezing or shortness of breath.   amLODipine (NORVASC) 5 MG tablet Take 5 mg by mouth daily.   aspirin EC 325 MG tablet TAKE 1 TABLET BY MOUTH ONCE A DAY.   atorvastatin (LIPITOR) 40 MG tablet TAKE (1) TABLET BY MOUTH AT BEDTIME.   cholecalciferol 25 MCG (1000 UT) tablet Take by mouth.   citalopram (CELEXA) 20 MG tablet Take by mouth.   clotrimazole-betamethasone (LOTRISONE) cream Apply 1 Application topically daily.   donepezil (ARICEPT) 10 MG tablet    econazole nitrate 1 % cream SMARTSIG:1 Topical Daily   fluticasone (FLONASE) 50 MCG/ACT nasal spray 2 sprays.   levETIRAcetam (KEPPRA) 250 MG tablet TAKE (1) TABLET BY MOUTH EACH MORNING.   lisinopril (ZESTRIL) 10 MG tablet Take 10 mg by mouth daily.   lisinopril (ZESTRIL) 20 MG tablet Take 20 mg by mouth daily.   LORazepam (ATIVAN) 1 MG tablet Take 1 tablet (1 mg total) by mouth at bedtime.   lubiprostone (AMITIZA) 24 MCG capsule TAKE 1 CAPSULE BY MOUTH DAILY   Multiple Vitamin (MULTIVITAMIN ADULT PO) Take by mouth.   MYRBETRIQ 50 MG TB24 tablet Take 50 mg by mouth daily.   nitrofurantoin, macrocrystal-monohydrate, (MACROBID) 100 MG capsule Take 1 capsule (100 mg total) by mouth 2 (two) times daily. 1 po BId   NITROSTAT 0.4 MG SL tablet PLACE 1 TAB UNDER TONGUE EVERY 5 MIN IF NEEDED FOR CHEST PAIN. MAY USE 3 TIMES.NO RELIEF CALL 911. (Patient taking differently: Place 0.4 mg under the tongue every 5 (five) minutes as needed for chest pain.)   omeprazole (PRILOSEC) 20 MG capsule TAKE 1 CAPSULE BY MOUTH ONCE DAILY FOR RELFUX, ESOPHAGITIS, OR STOMACH ULCERS.   PARoxetine (PAXIL) 40 MG tablet TAKE 1 TABLET BY MOUTH ONCE A DAY.   QC VITAMIN B1 100 MG tablet TAKE 1 TABLET BY MOUTH ONCE A DAY.   Respiratory Therapy Supplies (NEBULIZER/TUBING/MOUTHPIECE) KIT Disp one nebulizer machine, tubing set and mouthpiece kit   traZODone (DESYREL) 100 MG tablet TAKE (1) TABLET BY MOUTH AT BEDTIME AS NEEDED FOR SLEEP   vitamin B-12 (CYANOCOBALAMIN) 1000 MCG  tablet Take one tablet by mouth once daily (Patient taking differently: Take 1,000 mcg by mouth daily.)   No facility-administered encounter medications on file as of 02/28/2022.    Past Medical History:  Diagnosis Date   Abnormality of gait 08/01/2013   Arthritis    Blindness of left eye    Posttraumatic   Carotid artery disease (HCC)    Colon polyp    Constipation    Diaphragmatic hernia    Disc disease, degenerative, cervical    Diverticulitis    ED (erectile dysfunction)    Gastroparesis    GERD (gastroesophageal reflux disease)    Glaucoma    Heart disease    Hyperlipidemia    Hypertension    Insomnia    Memory difficulty 08/04/2014   Nicotine dependence  Numbness and tingling of right arm 08/04/2014   Osteoarthritis    Overactive bladder    Radiculopathy    Shortness of breath    with activity   Sleep apnea    does not use CPAP.  Tested maybe 5- 10 years ago   Stroke [facility])    Left side weakness.   Unilateral inguinal hernia     Past Surgical History:  Procedure Laterality Date   ANTERIOR CERVICAL DECOMP/DISCECTOMY FUSION N/A 07/01/2012   Procedure: ANTERIOR CERVICAL DECOMPRESSION/DISCECTOMY FUSION CERVICAL FIVE-SIX [name] REMOVAL CERVICAL SIX-SEVEN;  Surgeon: [name], MD;  Location: [facility];  Service: Neurosurgery;  Laterality: N/A;   CERVICAL FUSION     COLONOSCOPY   08/04/2002     RMR: Normal rectum/Pancolonic diverticula/Colonic polyps as described above, biopsied and/or snared   COLONOSCOPY  60/08/2009   RMR: normal rectum/left and right sided diverticula/multiple colonic polyps. tubular adenomas. surveillance due 2014   COLONOSCOPY WITH ESOPHAGOGASTRODUODENOSCOPY (EGD) N/A 12/18/2012   Dr. [name]:  Colonic diverticulosis. Multiple colonic polyps-hyperplastic polyps and tubular adenoma. Friable anal canal hemorrhoids. EGD with chronic atrophic gastritis   ESOPHAGOGASTRODUODENOSCOPY  04/14/2002   [id number] ring, status post dilation as  described above/Hiatal hernia, focal antral erosions of uncertain clinical significance   ESOPHAGOGASTRODUODENOSCOPY  06/09/2009   RMR: normal esophagus s/p dilator/small hiatal hernia otherwise normal   HERNIA REPAIR Right    inguinal   I & D EXTREMITY Right 04/23/2017   Procedure: [place] HAND;  Surgeon: [name], MD;  Location: [facility];  Service: Orthopedics;  Laterality: Right;   INGUINAL HERNIA REPAIR Left 04/15/2014   Procedure: LEFT INGUINAL HERNIORRHAPHY WITH MESH;  Surgeon: [name], MD;  Location: [facility];  Service: General;  Laterality: Left;   INSERTION OF MESH Left 04/15/2014   Procedure: INSERTION OF MESH;  Surgeon: [name], MD;  Location: [facility];  Service: General;  Laterality: Left;    Family History  Problem Relation Age of Onset   Cancer Mother    Dementia Father    Colon cancer Neg Hx     Social History   Socioeconomic History   Marital status: Widowed    Spouse name: Not on file   Number of children: 1   Years of education: hs   Highest education level: Not on file  Occupational History   Occupation: Retired    Fish farm manager: RETIRED  Tobacco Use   Smoking status: Every Day    Packs/day: 0.25    Years: 60.00    Total pack years: 15.00    Types: Cigarettes   Smokeless tobacco: Never   Tobacco comments:    using patch  Vaping Use   Vaping Use: Never used  Substance and Sexual Activity   Alcohol use: No   Drug use: No   Sexual activity: Not Currently  Other Topics Concern   Not on file  Social History Narrative   Patient drinks 1-2 cups of caffeine daily.   Patient is right handed.   Lives with his sister [name]   Has a nurse/ Caregiver, [name] Scales to help with bed baths, dressing, meals, etc 5 days per week   Social Determinants of Health   Financial Resource Strain: Low Risk  (08/31/2020)   Overall Financial Resource Strain (CARDIA)    Difficulty of Paying Living Expenses: Not very hard  Food  Insecurity: No Food Insecurity (08/31/2020)   Hunger Vital Sign    Worried About Running Out of Food in  the Last Year: Never true    [place] in the Last Year: Never true  Transportation Needs: No Transportation Needs (08/31/2020)   PRAPARE - Hydrologist (Medical): No    Lack of Transportation (Non-Medical): No  Physical Activity: Inactive (08/31/2020)   Exercise Vital Sign    Days of Exercise per Week: 0 days    Minutes of Exercise per Session: 0 min  Stress: No Stress Concern Present (08/31/2020)   [place]    Feeling of Stress : Not at all  Social Connections: Moderately Isolated (08/31/2020)   Social Connection and Isolation Panel [NHANES]    Frequency of Communication with Friends and Family: Twice a week    Frequency of Social Gatherings with Friends and Family: More than three times a week    Attends Religious Services: More than 4 times per year    Active Member of [employer] or Organizations: No    Attends Archivist Meetings: Never    Marital Status: Widowed  Intimate Partner Violence: Not At Risk (08/31/2020)   Humiliation, Afraid, Rape, and Kick questionnaire    Fear of Current or Ex-Partner: No    Emotionally Abused: No    Physically Abused: No    Sexually Abused: No    ROS As per HPI    Objective   BP 125/74   Pulse 73   Temp 98.4 F (36.9 C)   Ht '5\' 9"'$  (1.753 m)   Wt 120 lb (54.4 kg) Comment: previous, unable to stand  SpO2 97%   BMI 17.72 kg/m   Physical Exam Constitutional:      General: He is not in acute distress.    Appearance: He is not ill-appearing or toxic-appearing.  Cardiovascular:     Rate and Rhythm: Normal rate and regular rhythm.     Pulses: Normal pulses.  Pulmonary:     Breath sounds: Decreased air movement present. Examination of the right-upper field reveals wheezing. Examination of the left-upper field reveals wheezing.  Examination of the right-middle field reveals wheezing. Examination of the left-middle field reveals wheezing. Examination of the right-lower field reveals wheezing. Examination of the left-lower field reveals wheezing. Wheezing present.  Abdominal:     General: Abdomen is flat. Bowel sounds are normal.     Palpations: Abdomen is soft.  Skin:    General: Skin is warm.     Capillary Refill: Capillary refill takes less than 2 seconds.     Comments: Excoriation along bilateral inner buttocks   Neurological:     Mental Status: He is alert. He is disoriented.     Motor: Weakness present.     Coordination: Coordination abnormal.     Gait: Gait abnormal.  Psychiatric:        Mood and Affect: Mood is anxious and depressed.        Speech: Speech is delayed and slurred.        Behavior: Behavior is cooperative.        Cognition and Memory: Cognition is impaired. Memory is impaired. He exhibits impaired recent memory and impaired remote memory.        Judgment: Judgment is not impulsive or inappropriate.    Assessment & Plan:  1. Dementia, unspecified dementia severity, unspecified dementia type, unspecified whether behavioral, psychotic, or mood disturbance or anxiety ([place]) Referrals as below. Pt previously taking Aricept for memory, following with neurology, and receiving PT. Unsure if pt is  currently taking Aricept. Will continue to monitor in chronic care follow up.  - AMB Referral to Chronic Care Management Services - Ambulatory referral to Neurology  2. Seizure [facility]) - Ambulatory referral to Neurology Referral to Neuro as above. Pt has been on Keppra for several years without monitoring. Will await recommendations from neuro to determine plan of care.   3. Abnormality of gait Per Neurology note 08/01/2013, pt experienced sudden change in ability to ambulate on 08/10/2011. Had spinal fusion in June of 2014, no significant cord compression was noted at that time. Provider at that time concerned  for severe dysmetria. Per chart review, neurology attributed symptoms to medullary stroke. Neurology referral place as above. Pt not currently participating in PT. Will continue to monitor at chronic care follow up and discuss with patient and family desire to resume PT.   4. Alteration in skin integrity due to moisture Discussed continued use of barrier cream in areas prone to breakdown due to moisture. Discussed methods to decrease the risk of pressure injury. Provided pt and caregiver with mepilex pads to protect bony prominences of sacrum.   5. IPF (idiopathic pulmonary fibrosis) ([place]) Per caregiver, pt continues to complete "breathing treatments". Per chart review has not been followed by Pulm. Last note, 10/20/2019, recommended follow up with Dr. Chase Caller in ILD clinic and Dr. Gala Murdoch within 6-8 weeks. Per my knowledge, pt has not received follow up. Will place referral as below and await recommendations.  - Ambulatory referral to Pulmonology  6. Protein-calorie malnutrition, unspecified severity ([place]) Labs as below. Will communicate results to patient once available. Pt previously treated for Vitamin B12 and Vitamin B1 deficiencies. Will monitor as below and determine course of treatment. Pt currently receiving boost daily. Continue boost. Discussed with caregiver methods to increase calorie intake.  - CMP14+EGFR - CBC with Differential/Platelet - Vitamin B12 - VITAMIN D 25 Hydroxy (Vit-D Deficiency, Fractures) - TSH - Vitamin B1  7. Depression, recurrent ([place]) 8. Anxiety Referral as below. Discussed discontinuing ativan given concern with age and syncopal episodes. CCM referral placed. Pt currently on max dose of Paxil. Symptoms not well controled. Pt continues to have prolonged grief over loss of wife.  - Ambulatory referral to Psychology - LORazepam (ATIVAN) 1 MG tablet; Take 1 tablet (1 mg total) by mouth at bedtime.  Dispense: 30 tablet; Refill: 0  9. Chronic insomnia  Not well  controlled. Pt currently takes Ativan at 4 pm and Trazodone at 7 pm to go to sleep. Pt is still waking at 2-3 am and is unable to return to sleep easily. Consult placed to psychology and CCM to assist. Discuss caution with increasing trazodone due to age, syncopal episodes, polypharmacy, and nocturnal enuresis.   10. Long-term current use of benzodiazepine Labs as below. Will communicate results to patient once available. Discussed with patient and caregiver that Ativan can be dangerous for pt at his age especially given that he is up at night with nocturnal enuresis and has a history of syncopal episodes. Consulted CCM to assist with polypharmacy and potential discontinuation of Lorazepam.  - Drug Screen 10 W/Conf, Se  11. Gastroesophageal reflux disease with esophagitis without hemorrhage Well controlled. Continue current regimen.   12. Nocturnal enuresis Last seen urology 09/2021 - recommended d/c myrbetriq due to lack of efficacy. Pt instructed by urology to increase hydration during the day. Discussed with CNA methods to increase hydration such as popsicles , soups, jello cups. Will discuss with caregivers and patient at next follow up discontinuing  myrbetriq    13. Encounter for screening for cardiovascular disorders Labs as below. Will communicate results to patient once available. Hx of stroke, pt currently on Lipitor. Well controlled per labs on 07/12/2021.  - Lipid panel  14. Tobacco use disorder Counseled on continued tobacco use. Does not wish to stop at this time. Will continue to monitor.    Pt has palliative care referral active, but pt/family has not contacted palliative care at this time. Will determine plan based on shared-decision making with patient and family.   The above assessment and management plan was discussed with the patient. The patient verbalized understanding of and has agreed to the management plan using shared-decision making. Patient is aware to call the clinic  if they develop any new symptoms or if symptoms fail to improve or worsen. Patient is aware when to return to the clinic for a follow-up visit. Patient educated on when it is appropriate to go to the emergency department.   Follow up 4 weeks for chronic condition management   Donzetta Kohut, DNP-FNP [place] [address] [place], [place] [address] [phone]

## 2022-03-01 LAB — CBC WITH DIFFERENTIAL/PLATELET
Basophils Absolute: 0.1 10*3/uL (ref 0.0–0.2)
Basos: 1 %
EOS (ABSOLUTE): 0.7 10*3/uL — ABNORMAL HIGH (ref 0.0–0.4)
Eos: 12 %
Hematocrit: 40.1 % (ref 37.5–51.0)
Hemoglobin: 13.2 g/dL (ref 13.0–17.7)
Immature Grans (Abs): 0 10*3/uL (ref 0.0–0.1)
Immature Granulocytes: 0 %
Lymphocytes Absolute: 2.5 10*3/uL (ref 0.7–3.1)
Lymphs: 40 %
MCH: 29.6 pg (ref 26.6–33.0)
MCHC: 32.9 g/dL (ref 31.5–35.7)
MCV: 90 fL (ref 79–97)
Monocytes Absolute: 0.4 10*3/uL (ref 0.1–0.9)
Monocytes: 7 %
Neutrophils Absolute: 2.6 10*3/uL (ref 1.4–7.0)
Neutrophils: 40 %
Platelets: 186 10*3/uL (ref 150–450)
RBC: 4.46 x10E6/uL (ref 4.14–5.80)
RDW: 13.1 % (ref 11.6–15.4)
WBC: 6.3 10*3/uL (ref 3.4–10.8)

## 2022-03-01 LAB — VITAMIN D 25 HYDROXY (VIT D DEFICIENCY, FRACTURES): Vit D, 25-Hydroxy: 27.4 ng/mL — ABNORMAL LOW (ref 30.0–100.0)

## 2022-03-01 LAB — LIPID PANEL
Chol/HDL Ratio: 1.9 ratio (ref 0.0–5.0)
Cholesterol, Total: 113 mg/dL (ref 100–199)
HDL: 58 mg/dL (ref >39)
LDL Chol Calc (NIH): 40 mg/dL (ref 0–99)
Triglycerides: 71 mg/dL (ref 0–149)
VLDL Cholesterol Cal: 15 mg/dL (ref 5–40)

## 2022-03-01 LAB — CMP14+EGFR
ALT: 16 IU/L (ref 0–44)
AST: 29 IU/L (ref 0–40)
Albumin/Globulin Ratio: 1.4 (ref 1.2–2.2)
Albumin: 4 g/dL (ref 3.7–4.7)
Alkaline Phosphatase: 103 IU/L (ref 44–121)
BUN/Creatinine Ratio: 12 (ref 10–24)
BUN: 12 mg/dL (ref 8–27)
Bilirubin Total: 0.3 mg/dL (ref 0.0–1.2)
CO2: 25 mmol/L (ref 20–29)
Calcium: 9.2 mg/dL (ref 8.6–10.2)
Chloride: 107 mmol/L — ABNORMAL HIGH (ref 96–106)
Creatinine, Ser: 1.04 mg/dL (ref 0.76–1.27)
Globulin, Total: 2.9 g/dL (ref 1.5–4.5)
Glucose: 86 mg/dL (ref 70–99)
Potassium: 4.4 mmol/L (ref 3.5–5.2)
Sodium: 145 mmol/L — ABNORMAL HIGH (ref 134–144)
Total Protein: 6.9 g/dL (ref 6.0–8.5)
eGFR: 70 mL/min/{1.73_m2} (ref >59)

## 2022-03-01 LAB — TSH: TSH: 1.58 u[IU]/mL (ref 0.450–4.500)

## 2022-03-01 LAB — VITAMIN B12: Vitamin B-12: 1882 pg/mL — ABNORMAL HIGH (ref 232–1245)

## 2022-03-03 LAB — DRUG SCREEN 10 W/CONF, SERUM
Amphetamines, IA: NEGATIVE ng/mL
Barbiturates, IA: NEGATIVE ug/mL
Benzodiazepines, IA: NEGATIVE ng/mL
Cocaine & Metabolite, IA: NEGATIVE ng/mL
Methadone, IA: NEGATIVE ng/mL
Opiates, IA: NEGATIVE ng/mL
Oxycodones, IA: NEGATIVE ng/mL
Phencyclidine, IA: NEGATIVE ng/mL
Propoxyphene, IA: NEGATIVE ng/mL
THC(Marijuana) Metabolite, IA: NEGATIVE ng/mL

## 2022-03-06 ENCOUNTER — Telehealth: Payer: Self-pay

## 2022-03-06 LAB — VITAMIN B1: Thiamine: 213.7 nmol/L — ABNORMAL HIGH (ref 66.5–200.0)

## 2022-03-06 NOTE — Progress Notes (Signed)
  Chronic Care Management   Note  03/06/2022 Name: [name] MRN: [id number] DOB: [birth year]  [name] is a 88 y.o. year old male who is a primary care patient of Jeneen Montgomery, [place] [place], [place]. I reached out to [name] by phone today in response to a referral sent by [name].  [name] was given information about Chronic Care Management services today including:  CCM service includes personalized support from designated clinical staff supervised by the physician, including individualized plan of care and coordination with other care providers 24/7 contact phone numbers for assistance for urgent and routine care needs. Service will only be billed when office clinical staff spend 20 minutes or more in a month to coordinate care. Only one practitioner may furnish and bill the service in a calendar month. The patient may stop CCM services at amy time (effective at the end of the month) by phone call to the office staff. The patient will be responsible for cost sharing (co-pay) or up to 20% of the service fee (after annual deductible is met)  [name]'s caregiver  declinedto scheduling an appointment with the CCM RN Case Manager   Follow up plan: Patient's caregiver [name] did not agree to scheduling an appointment with the RN Case Manager. The ordering provider has been notified.   Noreene Larsson, [place], [place] [address] Direct Dial: [phone] [email or web address]

## 2022-03-22 ENCOUNTER — Telehealth: Payer: Self-pay | Admitting: Family Medicine

## 2022-03-22 NOTE — Telephone Encounter (Signed)
Calling for clarification on discontinuing a vitamin for the patient and if LORazepam (ATIVAN) 1 MG tablet will be called in before his appt on 3/26. Please call back.

## 2022-03-22 NOTE — Telephone Encounter (Signed)
Care giver thought patient was discontinued on vit b please advise. Also last Ativan was sent in for 30 days with 0 refills on it but not to the pharmacy said it said to refill 1 more time after 1st refill of 30 days please advise?

## 2022-03-22 NOTE — Telephone Encounter (Signed)
Informed pharmacy that b1 and b12 discontinued because of elevated lab values. Vitamin D recommended because of low lab values.  Benzodiazapine discontinued because last 2 drug screens indicated that no substance was present in his system and it is a violation of controlled substance agreement

## 2022-03-23 NOTE — Telephone Encounter (Signed)
Spoke with home health nurse. She is aware of changes in patients care. She reports that patient's sister recently passed. She is going to try to get patient's niece to next appointment. She says it might prove to be difficult as niece is not in the greatest health as well

## 2022-03-23 NOTE — Telephone Encounter (Signed)
CNA [employer] CNA calling back about this message. Please call back

## 2022-03-28 ENCOUNTER — Ambulatory Visit (INDEPENDENT_AMBULATORY_CARE_PROVIDER_SITE_OTHER): Payer: 59 | Admitting: Family Medicine

## 2022-03-28 ENCOUNTER — Encounter: Payer: Self-pay | Admitting: Family Medicine

## 2022-03-28 VITALS — BP 159/76 | HR 88 | Temp 98.0°F | Ht 69.0 in | Wt 120.0 lb

## 2022-03-28 DIAGNOSIS — E46 Unspecified protein-calorie malnutrition: Secondary | ICD-10-CM | POA: Diagnosis not present

## 2022-03-28 DIAGNOSIS — R569 Unspecified convulsions: Secondary | ICD-10-CM | POA: Diagnosis not present

## 2022-03-28 DIAGNOSIS — N3944 Nocturnal enuresis: Secondary | ICD-10-CM

## 2022-03-28 DIAGNOSIS — Z8673 Personal history of transient ischemic attack (TIA), and cerebral infarction without residual deficits: Secondary | ICD-10-CM

## 2022-03-28 DIAGNOSIS — F0390 Unspecified dementia without behavioral disturbance: Secondary | ICD-10-CM | POA: Diagnosis not present

## 2022-03-28 DIAGNOSIS — I6523 Occlusion and stenosis of bilateral carotid arteries: Secondary | ICD-10-CM

## 2022-03-28 DIAGNOSIS — Z5989 Other problems related to housing and economic circumstances: Secondary | ICD-10-CM

## 2022-03-28 DIAGNOSIS — J302 Other seasonal allergic rhinitis: Secondary | ICD-10-CM

## 2022-03-28 DIAGNOSIS — K5901 Slow transit constipation: Secondary | ICD-10-CM

## 2022-03-28 DIAGNOSIS — F339 Major depressive disorder, recurrent, unspecified: Secondary | ICD-10-CM | POA: Diagnosis not present

## 2022-03-28 DIAGNOSIS — K2100 Gastro-esophageal reflux disease with esophagitis, without bleeding: Secondary | ICD-10-CM

## 2022-03-28 DIAGNOSIS — J84112 Idiopathic pulmonary fibrosis: Secondary | ICD-10-CM

## 2022-03-28 DIAGNOSIS — I10 Essential (primary) hypertension: Secondary | ICD-10-CM

## 2022-03-28 DIAGNOSIS — F5104 Psychophysiologic insomnia: Secondary | ICD-10-CM

## 2022-03-28 MED ORDER — LEVETIRACETAM 250 MG PO TABS: ORAL_TABLET | ORAL | 3 refills | Status: AC

## 2022-03-28 MED ORDER — ATORVASTATIN CALCIUM 40 MG PO TABS: ORAL_TABLET | ORAL | 3 refills | Status: DC

## 2022-03-28 MED ORDER — ALBUTEROL SULFATE (2.5 MG/3ML) 0.083% IN NEBU: 2.5000 mg | INHALATION_SOLUTION | Freq: Every day | RESPIRATORY_TRACT | 0 refills | Status: AC | PRN

## 2022-03-28 MED ORDER — NITROGLYCERIN 0.4 MG SL SUBL: 0.4000 mg | SUBLINGUAL_TABLET | SUBLINGUAL | 0 refills | Status: DC | PRN

## 2022-03-28 MED ORDER — DONEPEZIL HCL 10 MG PO TABS: ORAL_TABLET | ORAL | 0 refills | Status: DC

## 2022-03-28 MED ORDER — LUBIPROSTONE 24 MCG PO CAPS: 24.0000 ug | ORAL_CAPSULE | Freq: Every day | ORAL | 2 refills | Status: AC

## 2022-03-28 MED ORDER — PAROXETINE HCL 40 MG PO TABS: 40.0000 mg | ORAL_TABLET | Freq: Every day | ORAL | 2 refills | Status: AC

## 2022-03-28 MED ORDER — TRAZODONE HCL 100 MG PO TABS: ORAL_TABLET | ORAL | 2 refills | Status: AC

## 2022-03-28 MED ORDER — OMEPRAZOLE 20 MG PO CPDR: DELAYED_RELEASE_CAPSULE | ORAL | 2 refills | Status: AC

## 2022-03-28 MED ORDER — FLUTICASONE PROPIONATE 50 MCG/ACT NA SUSP: 2.0000 | Freq: Every day | NASAL | 0 refills | Status: AC

## 2022-03-28 MED ORDER — ASPIRIN 325 MG PO TBEC: 325.0000 mg | DELAYED_RELEASE_TABLET | Freq: Every day | ORAL | 2 refills | Status: DC

## 2022-03-28 NOTE — Progress Notes (Signed)
Acute Office Visit  Subjective:  Patient ID: [name], male    DOB: 21-Oct-1934, 88 y.o.   MRN: [id number]  Chief Complaint  Patient presents with   Medical Management of Chronic Issues    Discuss medications   Patient is in today for follow up of chronic conditions  HPI  1. Dementia, unspecified dementia severity, unspecified dementia type, unspecified whether behavioral, psychotic, or mood disturbance or anxiety ([place]) 2. Seizure ([place]) Has not had a seizure in years. Continues to take Keppra. Has not made an appointment with neuro yet.  Per CMA, continues to wake in the middle of the night. Takes trazodone at 5:30 pm.    3. Protein-calorie malnutrition, unspecified severity ([place]) States that he has had an increased appetite. Drinks boost at breakfast. Drinks juice throughout the day.  Continues to urinate at night.  Does not urinate during the day.   4. Depression, recurrent ([place]) States that he continues to not sleep. Has lack of motivation. Has lack of interest in activities. Wakes up in the middle of the night to talk to his deceased wife's picture.   5. Primary hypertension CMA is taking BP daily. States that BP are averaging 130/80.  6. IPF (idiopathic pulmonary fibrosis) (HCC) Continues to complete breathing treatments daily which significantly helps with breathing and wheezing per CMA. Is taking Mucinex DM regularly to assist with PND.   ROS As per HPI Objective:  BP (!) 159/76   Pulse 88   Temp 98 F (36.7 C)   Ht 5\' 9"  (1.753 m)   Wt 120 lb (54.4 kg)   SpO2 98%   BMI 17.72 kg/m    Physical Exam Constitutional:      General: He is not in acute distress.    Appearance: Normal appearance. He is not ill-appearing, toxic-appearing or diaphoretic.  Cardiovascular:     Rate and Rhythm: Normal rate.     Pulses: Normal pulses.     Heart sounds: Normal heart sounds. No murmur heard.    No gallop.  Pulmonary:     Effort: Pulmonary effort is normal. No  respiratory distress.     Breath sounds: Normal breath sounds. No stridor. No wheezing, rhonchi or rales.  Skin:    General: Skin is warm.     Capillary Refill: Capillary refill takes less than 2 seconds.  Neurological:     Mental Status: He is alert. Mental status is at baseline. He is disoriented.     Motor: No weakness.     Comments: Disoriented to time and situation Oriented to self and place    Psychiatric:        Attention and Perception: He is inattentive.        Mood and Affect: Mood is depressed. Affect is labile.        Behavior: Behavior is cooperative.        Cognition and Memory: Cognition is impaired. Memory is impaired.    Assessment & Plan:  1. Dementia, unspecified dementia severity, unspecified dementia type, unspecified whether behavioral, psychotic, or mood disturbance or anxiety ([place]) Refill placed as below.  Pt has active referral for Neurology. Instructed caregiver to follow up with them for appropriateness of medication.  - donepezil (ARICEPT) 10 MG tablet; Take Daily  Dispense: 90 tablet; Refill: 0  2. Seizure ([place]) Refill placed as below.  Pt has active referral for Neurology. Instructed caregiver to follow up with them for appropriateness of medication. Will refill medication as below to bridge until they  can be seen by neuro.  - levETIRAcetam (KEPPRA) 250 MG tablet; Needs to be seen by neuro  Dispense: 30 tablet; Refill: 3  3. Protein-calorie malnutrition, unspecified severity ([place]) Encouraged pt to continue boost intake, continue water intake, try to increase fiber and protein in diet.   4. Depression, recurrent ([place]) Refill placed as below.  Pt has active psychology referral. Discussed with caregiver that if they would like to restart benzodiazepine or do not feel that things are well controlled with paxil and trazodone, that I would be happy to adjust the referral from psychology to psychiatry.  - PARoxetine (PAXIL) 40 MG tablet; Take 1 tablet (40 mg  total) by mouth daily.  Dispense: 30 tablet; Refill: 2  5. Primary hypertension Elevated BP today in office. Discussed with patient caregiver monitoring BP at home. Provided  Instructed caregiver to take BP first thing in the morning after sitting for 5 minutes with feet flat on the floor, arm at heart level. Caregiver verbalized that they have access to BP monitor at home. Will review measurements with patient at follow up and determine plan for BP management. Pt previously on pharmacologic therapy for BP,  discontinued due to hypotension per caregiver.   6. IPF (idiopathic pulmonary fibrosis) ([place]) Refill placed as below. Pt has appointment with Pulmonology in April.  - albuterol (PROVENTIL) (2.5 MG/3ML) 0.083% nebulizer solution; Take 3 mLs (2.5 mg total) by nebulization daily as needed for wheezing or shortness of breath.  Dispense: 75 mL; Refill: 0  7. Chronic insomnia Refill placed as below. Discussed that patient was taking medication early in the evening. Could try to push medication back to assist with patient sleep/wake cycle.  - traZODone (DESYREL) 100 MG tablet; TAKE (1) TABLET BY MOUTH AT BEDTIME AS NEEDED FOR SLEEP  Dispense: 30 tablet; Refill: 2  8. Nocturnal enuresis Declined refill on medication at this time. Previously on pharmacologic therapy by urology. Discontinued by urology due to lack of efficacy. Discussed with caregiver that he could present back to urology without a referral to discuss additional treatment options given that he has been seen in the last 3 years.   9. Gastroesophageal reflux disease with esophagitis without hemorrhage Well controlled on current regimen. Refill placed as below.  - omeprazole (PRILOSEC) 20 MG capsule; Take daily  Dispense: 30 capsule; Refill: 2  10. Seasonal allergies Well controlled on current regimen. Refill placed as below.  - fluticasone (FLONASE) 50 MCG/ACT nasal spray; Place 2 sprays into both nostrils daily.  Dispense: 1 g;  Refill: 0  11. Bilateral carotid artery stenosis 12. History of stroke  Refills placed as below. Lipids within normal limits with last evaluation 02/28/22 - atorvastatin (LIPITOR) 40 MG tablet; Take daily in the evening  Dispense: 30 tablet; Refill: 3 - aspirin EC 325 MG tablet; Take 1 tablet (325 mg total) by mouth daily.  Dispense: 30 tablet; Refill: 2  13. Slow transit constipation Refill placed as below. Pt does not have BM every day which concerns the patient. Discussed that if he is not taking in enough water, food, especially fiber, then he will not produce a BM.  - lubiprostone (AMITIZA) 24 MCG capsule; Take 1 capsule (24 mcg total) by mouth daily.  Dispense: 30 capsule; Refill: 2  14. Other problems related to housing and economic circumstances Patient caregiver states that niece, POA, is working on having the pt placed in a home for complete care. They are working with Marliss Coots, caseworker of [facility], to coordinate placement  for patient. According to caregiver, pt is unable to be placed at this time due to the inability to cover life insurance policy. Offered referral to CCM, declined at this time.   Instructed pt and caregiver to follow up in 1 month for BP.  Niece is the POA: [name] [id number]   The above assessment and management plan was discussed with the patient. The patient verbalized understanding of and has agreed to the management plan using shared-decision making. Patient is aware to call the clinic if they develop any new symptoms or if symptoms fail to improve or worsen. Patient is aware when to return to the clinic for a follow-up visit. Patient educated on when it is appropriate to go to the emergency department.   Donzetta Kohut, DNP-FNP [facility] [address] [place], [place] [address] [phone]

## 2022-03-29 NOTE — Progress Notes (Signed)
TC from [facility] about Keppra script w/ the note about needs to see Neuro, clarification from PCP is that she is giving the qty & refills till he sees neuro then they will take over after his appt, [facility] aware and making note in pt's chart.

## 2022-03-30 ENCOUNTER — Ambulatory Visit: Payer: Medicare Other | Admitting: Family Medicine

## 2022-04-06 ENCOUNTER — Telehealth: Payer: Self-pay | Admitting: Family Medicine

## 2022-04-06 DIAGNOSIS — F0390 Unspecified dementia without behavioral disturbance: Secondary | ICD-10-CM

## 2022-04-06 NOTE — Telephone Encounter (Signed)
[name] is his CNA do not see on HIPAA states his lorazepam was taking away and he really needs this but only wants to speak with Alvie Heidelberg or her nurse.

## 2022-04-06 NOTE — Telephone Encounter (Signed)
Calling to discuss a medication that the patient was taken off of. Please call back and advise.

## 2022-04-08 ENCOUNTER — Emergency Department (HOSPITAL_COMMUNITY)
Admission: EM | Admit: 2022-04-08 | Discharge: 2022-04-08 | Disposition: A | Discharge status: Home / Self Care | Payer: 59 | Attending: Student | Admitting: Student

## 2022-04-08 ENCOUNTER — Emergency Department (HOSPITAL_COMMUNITY): Payer: 59

## 2022-04-08 DIAGNOSIS — K5900 Constipation, unspecified: Secondary | ICD-10-CM | POA: Insufficient documentation

## 2022-04-08 DIAGNOSIS — F13930 Sedative, hypnotic or anxiolytic use, unspecified with withdrawal, uncomplicated: Secondary | ICD-10-CM

## 2022-04-08 DIAGNOSIS — I10 Essential (primary) hypertension: Secondary | ICD-10-CM | POA: Insufficient documentation

## 2022-04-08 DIAGNOSIS — F17210 Nicotine dependence, cigarettes, uncomplicated: Secondary | ICD-10-CM | POA: Insufficient documentation

## 2022-04-08 DIAGNOSIS — F0390 Unspecified dementia without behavioral disturbance: Secondary | ICD-10-CM | POA: Diagnosis not present

## 2022-04-08 DIAGNOSIS — R451 Restlessness and agitation: Secondary | ICD-10-CM | POA: Diagnosis not present

## 2022-04-08 DIAGNOSIS — K5730 Diverticulosis of large intestine without perforation or abscess without bleeding: Secondary | ICD-10-CM | POA: Diagnosis not present

## 2022-04-08 DIAGNOSIS — R450 Nervousness: Secondary | ICD-10-CM | POA: Diagnosis not present

## 2022-04-08 MED ORDER — LACTULOSE 10 GM/15ML PO SOLN: 30.0000 g | Freq: Once | ORAL | Status: DC

## 2022-04-08 MED ORDER — CHLORDIAZEPOXIDE HCL 25 MG PO CAPS: ORAL_CAPSULE | ORAL | 0 refills | Status: DC

## 2022-04-08 MED ORDER — CHLORDIAZEPOXIDE HCL 25 MG PO CAPS
25.0000 mg | ORAL_CAPSULE | Freq: Once | ORAL | Status: AC
  Administered 2022-04-08: 25 mg via ORAL
  Filled 2022-04-08: qty 1

## 2022-04-08 MED ORDER — GLYCERIN (LAXATIVE) 1 G RE SUPP: 1.0000 | RECTAL | Status: DC | PRN

## 2022-04-08 NOTE — ED Provider Notes (Signed)
[place] EMERGENCY DEPARTMENT AT [facility] Provider Note  CSN: [id number] Arrival date & time: 04/08/22 1131  Chief Complaint(s) Tremors and Constipation  HPI [name] is a 88 y.o. male with PMH dementia, seizure disorder, HTN, idiopathic pulmonary fibrosis, gastroparesis, previous CVA with left-sided weakness, posttraumatic left eye blindness who presents emergency department for evaluation of tremors and constipation.  Patient states that he was recently taken off of his lorazepam by his primary physician over the last 1 week and started to develop progressive worsening tremors.  He also states he has not had an appropriate bowel movement in 2 days.  Denies abdominal pain, nausea, vomiting, chest pain\, shortness of breath or other systemic symptoms.   Past Medical History Past Medical History:  Diagnosis Date   Abnormality of gait 08/01/2013   Arthritis    Blindness of left eye    Posttraumatic   Carotid artery disease (HCC)    Colon polyp    Constipation    Diaphragmatic hernia    Disc disease, degenerative, cervical    Diverticulitis    ED (erectile dysfunction)    Gastroparesis    GERD (gastroesophageal reflux disease)    Glaucoma    Heart disease    Hyperlipidemia    Hypertension    Insomnia    Memory difficulty 08/04/2014   Nicotine dependence    Numbness and tingling of right arm 08/04/2014   Osteoarthritis    Overactive bladder    Radiculopathy    Shortness of breath    with activity   Sleep apnea    does not use CPAP.  Tested maybe 5- 10 years ago   Stroke [facility])    Left side weakness.   Unilateral inguinal hernia    Patient Active Problem List   Diagnosis Date Noted   Cervical myelopathy 02/28/2022   Chronic obstructive pulmonary disease, unspecified COPD type 02/28/2022   Pre-syncope 09/28/2021   AKI (acute kidney injury) 08/24/2021   Acute metabolic encephalopathy 08/24/2021   Anxiety 01/07/2021   Elevated PSA 10/28/2020   Urinary  retention 10/25/2020   Abnormal urine odor 04/21/2020   Depression, recurrent 04/21/2020   Cerebrovascular accident (CVA) due to embolism of left middle cerebral artery 12/14/2018   Abnormal findings on diagnostic imaging of lung 08/19/2018   IPF (idiopathic pulmonary fibrosis) 08/19/2018   OSA (obstructive sleep apnea) 08/19/2018   Carotid artery disease 03/26/2017   Internal carotid artery stenosis, left 03/06/2017   Dysphagia 03/06/2017   Pulmonary nodules 03/06/2017   Seizure 03/03/2017   Protein-calorie malnutrition 01/10/2017   Chronic pain 10/02/2016   Chronic insomnia 08/31/2016   Lumbar spinal stenosis 08/18/2016   Wheelchair dependence 08/18/2016   Dementia 08/18/2016   Urinary incontinence 08/18/2016   Polypharmacy 08/18/2016   Laryngopharyngeal reflux (LPR) 08/12/2015   Memory difficulty 08/04/2014   Numbness and tingling of right arm 08/04/2014   Abnormality of gait 08/01/2013   Anemia 12/05/2012   Early satiety 12/03/2012   History of colon polyps 12/03/2012   Facial droop 10/12/2012   Spinal stenosis of cervical region 07/01/2012   GERD 05/27/2009   DYSPHAGIA UNSPECIFIED 05/27/2009   CHANGE IN BOWELS 05/27/2009   SMOKER 09/09/2008   Primary hypertension 09/09/2008   History of stroke 09/09/2008   Constipation 09/09/2008   HIGH BLOOD PRESSURE 07/25/2006   Home Medication(s) Prior to Admission medications   Medication Sig Start Date End Date Taking? Authorizing Provider  albuterol (PROVENTIL) (2.5 MG/3ML) 0.083% nebulizer solution Take 3 mLs (2.5 mg total) by  nebulization daily as needed for wheezing or shortness of breath. 03/28/22 06/26/22  Milian, Aleen Campi, FNP  aspirin EC 325 MG tablet Take 1 tablet (325 mg total) by mouth daily. 03/28/22   Arrie Senate, FNP  atorvastatin (LIPITOR) 40 MG tablet Take daily in the evening 03/28/22   Milian, Aleen Campi, FNP  cholecalciferol 25 MCG (1000 UT) tablet Take by mouth. 08/12/15   [provider]  donepezil (ARICEPT) 10 MG tablet Take Daily 03/28/22   Arrie Senate, FNP  fluticasone [facility]) 50 MCG/ACT nasal spray Place 2 sprays into both nostrils daily. 03/28/22 06/26/22  Arrie Senate, FNP  levETIRAcetam (KEPPRA) 250 MG tablet Needs to be seen by neuro 03/28/22   Arrie Senate, FNP  lubiprostone (AMITIZA) 24 MCG capsule Take 1 capsule (24 mcg total) by mouth daily. 03/28/22   Milian, Aleen Campi, FNP  nitroGLYCERIN (NITROSTAT) 0.4 MG SL tablet Place 1 tablet (0.4 mg total) under the tongue every 5 (five) minutes as needed for chest pain (Max of 3 doses at one time). PLACE 1 TAB UNDER TONGUE EVERY 5 MIN IF NEEDED FOR CHEST PAIN. MAY USE 3 TIMES If NO RELIEF CALL 911. 03/28/22   Arrie Senate, FNP  omeprazole (PRILOSEC) 20 MG capsule Take daily 03/28/22   Milian, Aleen Campi, FNP  PARoxetine (PAXIL) 40 MG tablet Take 1 tablet (40 mg total) by mouth daily. 03/28/22 06/26/22  Arrie Senate, FNP  Respiratory Therapy Supplies (NEBULIZER/TUBING/MOUTHPIECE) KIT Disp one nebulizer machine, tubing set and mouthpiece kit 08/17/17   Danelle Berry, PA-C  traZODone (DESYREL) 100 MG tablet TAKE (1) TABLET BY MOUTH AT BEDTIME AS NEEDED FOR SLEEP 03/28/22   Arrie Senate, FNP                                                                                                                                    Past Surgical History Past Surgical History:  Procedure Laterality Date   ANTERIOR CERVICAL DECOMP/DISCECTOMY FUSION N/A 07/01/2012   Procedure: ANTERIOR CERVICAL DECOMPRESSION/DISCECTOMY FUSION CERVICAL FIVE-SIX [name] REMOVAL CERVICAL SIX-SEVEN;  Surgeon: Temple Pacini, MD;  Location: [facility];  Service: Neurosurgery;  Laterality: N/A;   CERVICAL FUSION     COLONOSCOPY   08/04/2002     RMR: Normal rectum/Pancolonic diverticula/Colonic polyps as described above, biopsied and/or snared   COLONOSCOPY  60/08/2009   RMR: normal  rectum/left and right sided diverticula/multiple colonic polyps. tubular adenomas. surveillance due 2014   COLONOSCOPY WITH ESOPHAGOGASTRODUODENOSCOPY (EGD) N/A 12/18/2012   Dr. Jena Gauss:  Colonic diverticulosis. Multiple colonic polyps-hyperplastic polyps and tubular adenoma. Friable anal canal hemorrhoids. EGD with chronic atrophic gastritis   ESOPHAGOGASTRODUODENOSCOPY  04/14/2002   ZOX:[id number]' ring, status post dilation as described above/Hiatal hernia, focal antral erosions of uncertain clinical significance   ESOPHAGOGASTRODUODENOSCOPY  06/09/2009   RMR: normal esophagus s/p dilator/small hiatal hernia otherwise normal   HERNIA REPAIR Right  inguinal   I & D EXTREMITY Right 04/23/2017   Procedure: IRRIGATION AND DEBRIDEMENT HAND;  Surgeon: Sheral Apley, MD;  Location: [facility] OR;  Service: Orthopedics;  Laterality: Right;   INGUINAL HERNIA REPAIR Left 04/15/2014   Procedure: LEFT INGUINAL HERNIORRHAPHY WITH MESH;  Surgeon: Franky Macho Md, MD;  Location: [facility] ORS;  Service: General;  Laterality: Left;   INSERTION OF MESH Left 04/15/2014   Procedure: INSERTION OF MESH;  Surgeon: Franky Macho Md, MD;  Location: [facility] ORS;  Service: General;  Laterality: Left;   Family History Family History  Problem Relation Age of Onset   Cancer Mother    Dementia Father    Colon cancer Neg Hx     Social History Social History   Tobacco Use   Smoking status: Every Day    Packs/day: 0.25    Years: 60.00    Additional pack years: 0.00    Total pack years: 15.00    Types: Cigarettes   Smokeless tobacco: Never   Tobacco comments:    using patch  Vaping Use   Vaping Use: Never used  Substance Use Topics   Alcohol use: No   Drug use: No   Allergies Sulfa antibiotics  Review of Systems Review of Systems  Gastrointestinal:  Positive for abdominal distention and constipation.  Neurological:  Positive for tremors.    Physical Exam Vital Signs  I have reviewed the triage vital signs BP (!)  182/99   Pulse 94   Temp 97.7 F (36.5 C) (Oral)   Resp 17   SpO2 100%   Physical Exam Constitutional:      General: He is not in acute distress.    Appearance: Normal appearance.  HENT:     Head: Normocephalic and atraumatic.     Nose: No congestion or rhinorrhea.  Eyes:     General:        Right eye: No discharge.        Left eye: No discharge.     Extraocular Movements: Extraocular movements intact.     Pupils: Pupils are equal, round, and reactive to light.  Cardiovascular:     Rate and Rhythm: Normal rate and regular rhythm.     Heart sounds: No murmur heard. Pulmonary:     Effort: No respiratory distress.     Breath sounds: No wheezing or rales.  Abdominal:     General: There is distension.     Tenderness: There is no abdominal tenderness.  Musculoskeletal:        General: Normal range of motion.     Cervical back: Normal range of motion.  Skin:    General: Skin is warm and dry.  Neurological:     General: No focal deficit present.     Mental Status: He is alert.     Comments: tremors     ED Results and Treatments Labs (all labs ordered are listed, but only abnormal results are displayed) Labs Reviewed  URINALYSIS, ROUTINE W REFLEX MICROSCOPIC  Radiology DG Abdomen 1 View  Result Date: 04/08/2022 CLINICAL DATA:  Constipation EXAM: ABDOMEN - 1 VIEW COMPARISON:  11/04/2012 FINDINGS: Nonobstructive bowel gas pattern. No evidence of abnormal fecal retention. Abdominal aortic atherosclerosis. No acute bony abnormality. IMPRESSION: Nonobstructive bowel gas pattern. Electronically Signed   By: Duanne Guess D.O.   On: 04/08/2022 12:07    Pertinent labs & imaging results that were available during my care of the patient were reviewed by me and considered in my medical decision making (see MDM for details).  Medications Ordered in  ED Medications  lactulose (CHRONULAC) 10 GM/15ML solution 30 g (has no administration in time range)  glycerin (Pediatric) 1 g suppository 1 g (has no administration in time range)  chlordiazePOXIDE (LIBRIUM) capsule 25 mg (25 mg Oral Given 04/08/22 1200)                                                                                                                                     Procedures Procedures  (including critical care time)  Medical Decision Making / ED Course   This patient presents to the ED for concern of tremors, constipation, this involves an extensive number of treatment options, and is a complaint that carries with it a high risk of complications and morbidity.  The differential diagnosis includes benzodiazepine withdrawal, electrolyte abnormality, obstruction infection  MDM: Patient seen the emergency room for evaluation of tremors and constipation.  Physical exam with mild abdominal distention but no abdominal tenderness to palpation and tremoring of both hands.  Given that symptoms occurred soon after discontinuing his longstanding lorazepam, suspect underlying benzodiazepine withdrawal and patient given additional Librium here in the emergency department with significant improvement of his symptoms.  Imaging of the abdomen does not appear to show a large solid stool burden and with symptoms improved, patient discharged with Librium taper.   Additional history obtained: -Additional history obtained from friend -External records from outside source obtained and reviewed including: Chart review including previous notes, labs, imaging, consultation notes   Lab Tests: -I ordered, reviewed, and interpreted labs.   The pertinent results include:   Labs Reviewed  URINALYSIS, ROUTINE W REFLEX MICROSCOPIC         Imaging Studies ordered: I ordered imaging studies including KUB, CTAP I independently visualized and interpreted imaging. I agree with the radiologist  interpretation   Medicines ordered and prescription drug management: Meds ordered this encounter  Medications   chlordiazePOXIDE (LIBRIUM) capsule 25 mg   lactulose (CHRONULAC) 10 GM/15ML solution 30 g   glycerin (Pediatric) 1 g suppository 1 g    -I have reviewed the patients home medicines and have made adjustments as needed  Critical interventions none   Cardiac Monitoring: The patient was maintained on a cardiac monitor.  I personally viewed and interpreted the cardiac monitored which showed an underlying rhythm of: NSR  Social Determinants of Health:  Factors impacting patients care include: none  Reevaluation: After the interventions noted above, I reevaluated the patient and found that they have :improved  Co morbidities that complicate the patient evaluation  Past Medical History:  Diagnosis Date   Abnormality of gait 08/01/2013   Arthritis    Blindness of left eye    Posttraumatic   Carotid artery disease (HCC)    Colon polyp    Constipation    Diaphragmatic hernia    Disc disease, degenerative, cervical    Diverticulitis    ED (erectile dysfunction)    Gastroparesis    GERD (gastroesophageal reflux disease)    Glaucoma    Heart disease    Hyperlipidemia    Hypertension    Insomnia    Memory difficulty 08/04/2014   Nicotine dependence    Numbness and tingling of right arm 08/04/2014   Osteoarthritis    Overactive bladder    Radiculopathy    Shortness of breath    with activity   Sleep apnea    does not use CPAP.  Tested maybe 5- 10 years ago   Stroke [facility](HCC)    Left side weakness.   Unilateral inguinal hernia       Dispostion: I considered admission for this patient, but at this time he does not meet inpatient criteria for admission he is safe for discharge with outpatient follow-up     Final Clinical Impression(s) / ED Diagnoses Final diagnoses:  None     @PCDICTATION @    Glendora ScoreKommor, Nemiah Kissner, MD 04/08/22 1815

## 2022-04-08 NOTE — ED Triage Notes (Addendum)
Patient BIB by EMS for tremors, was taking 1mg  Lorazepam QD, and was taken off a week ago, since being off of meds, he's been having tremors. Pt also c/o constipation, nurse aide reports pt has not had BM for 2-3 days.

## 2022-04-08 NOTE — ED Notes (Signed)
[facility] called to transport patient back home via ambulance. Nurse notified.

## 2022-04-10 NOTE — Telephone Encounter (Signed)
[facility] went to [facility] over the [facility] due to withdraws from d/c from lorazepam. Darel Hong aware that Jerrel Ivory is not here today and will have to address tomorrow when she is back.

## 2022-04-11 ENCOUNTER — Telehealth: Payer: Self-pay | Admitting: *Deleted

## 2022-04-11 NOTE — Telephone Encounter (Signed)
Darel Hong (patients caregiver) calling again requesting to speak with pts PCP or nurse.

## 2022-04-11 NOTE — Telephone Encounter (Signed)
Patients caregiver, Sharmaine Base says Patient was a nervous wreck on Saturday.  She had to take him to the ED Saturday. She states patient was suffering from benzodiazapine withdrawal. She says the ED provider concluded as well. I asked how the patient was doing now. She says he is doing good right now because he was given medication to help with withdrawal (librium taper) She was adamant that we start patient back on benzodiazapine. She says we should have never taken him off of it in the first place. I explained that the last 2 drug screens indicated that there was no benzodiazapine in his system and that it a major concern.  She defended that she has been giving him his medication on a daily basis. I tried to reiterate that all the same the last 2 drug screens were negative for benzodiazapine. I explained that the providers plan is referral to psychiatry.  Caregiver went on saying that maybe she is going to have to find a new provider, that there was no way pt did not having medication in his symptom. I asked when was lorazepam given last, she said the day before we last saw him. According to medical record, that would be March 25th, 2024. Patient was seen in ED on 04/08/2022. That equals an 11 day window before patient presented with benzodiazapine withdrawal symptoms.

## 2022-04-11 NOTE — Telephone Encounter (Signed)
        Patient  visited [facility] on 04/08/2022  for treatment    Telephone encounter attempt :  1st  A HIPAA compliant voice message was left requesting a return call.  Instructed patient to call back at [phone].  Herminia Warren Greenauer -Mor[facility], Population Health [phone] [address] , [place] [address] Email : Azarel Banner. [email or web address]        

## 2022-04-14 ENCOUNTER — Emergency Department (HOSPITAL_COMMUNITY): Payer: 59

## 2022-04-14 ENCOUNTER — Encounter: Payer: Self-pay | Admitting: *Deleted

## 2022-04-14 ENCOUNTER — Inpatient Hospital Stay (HOSPITAL_COMMUNITY)
Admission: EM | Admit: 2022-04-14 | Discharge: 2022-04-18 | DRG: 308 | Disposition: A | Discharge status: Hospice | Payer: 59 | Attending: Internal Medicine | Admitting: Internal Medicine

## 2022-04-14 ENCOUNTER — Encounter (HOSPITAL_COMMUNITY): Payer: Self-pay | Admitting: *Deleted

## 2022-04-14 ENCOUNTER — Other Ambulatory Visit: Payer: Self-pay

## 2022-04-14 DIAGNOSIS — F5104 Psychophysiologic insomnia: Secondary | ICD-10-CM | POA: Diagnosis not present

## 2022-04-14 DIAGNOSIS — Z681 Body mass index (BMI) 19 or less, adult: Secondary | ICD-10-CM

## 2022-04-14 DIAGNOSIS — R001 Bradycardia, unspecified: Secondary | ICD-10-CM | POA: Diagnosis not present

## 2022-04-14 DIAGNOSIS — K2100 Gastro-esophageal reflux disease with esophagitis, without bleeding: Secondary | ICD-10-CM

## 2022-04-14 DIAGNOSIS — Z66 Do not resuscitate: Secondary | ICD-10-CM | POA: Diagnosis present

## 2022-04-14 DIAGNOSIS — I69354 Hemiplegia and hemiparesis following cerebral infarction affecting left non-dominant side: Secondary | ICD-10-CM

## 2022-04-14 DIAGNOSIS — F419 Anxiety disorder, unspecified: Secondary | ICD-10-CM | POA: Diagnosis present

## 2022-04-14 DIAGNOSIS — K219 Gastro-esophageal reflux disease without esophagitis: Secondary | ICD-10-CM | POA: Diagnosis present

## 2022-04-14 DIAGNOSIS — E876 Hypokalemia: Secondary | ICD-10-CM | POA: Diagnosis not present

## 2022-04-14 DIAGNOSIS — E46 Unspecified protein-calorie malnutrition: Secondary | ICD-10-CM | POA: Diagnosis not present

## 2022-04-14 DIAGNOSIS — F03B4 Unspecified dementia, moderate, with anxiety: Secondary | ICD-10-CM | POA: Diagnosis present

## 2022-04-14 DIAGNOSIS — H5462 Unqualified visual loss, left eye, normal vision right eye: Secondary | ICD-10-CM | POA: Diagnosis present

## 2022-04-14 DIAGNOSIS — F17210 Nicotine dependence, cigarettes, uncomplicated: Secondary | ICD-10-CM | POA: Diagnosis not present

## 2022-04-14 DIAGNOSIS — J449 Chronic obstructive pulmonary disease, unspecified: Secondary | ICD-10-CM | POA: Diagnosis not present

## 2022-04-14 DIAGNOSIS — Z79899 Other long term (current) drug therapy: Secondary | ICD-10-CM

## 2022-04-14 DIAGNOSIS — Z634 Disappearance and death of family member: Secondary | ICD-10-CM

## 2022-04-14 DIAGNOSIS — F03B18 Unspecified dementia, moderate, with other behavioral disturbance: Secondary | ICD-10-CM | POA: Diagnosis not present

## 2022-04-14 DIAGNOSIS — Z515 Encounter for palliative care: Secondary | ICD-10-CM

## 2022-04-14 DIAGNOSIS — I459 Conduction disorder, unspecified: Secondary | ICD-10-CM | POA: Diagnosis present

## 2022-04-14 DIAGNOSIS — Z1152 Encounter for screening for COVID-19: Secondary | ICD-10-CM

## 2022-04-14 DIAGNOSIS — Z882 Allergy status to sulfonamides status: Secondary | ICD-10-CM

## 2022-04-14 DIAGNOSIS — E785 Hyperlipidemia, unspecified: Secondary | ICD-10-CM | POA: Diagnosis present

## 2022-04-14 DIAGNOSIS — Z993 Dependence on wheelchair: Secondary | ICD-10-CM | POA: Diagnosis not present

## 2022-04-14 DIAGNOSIS — E43 Unspecified severe protein-calorie malnutrition: Secondary | ICD-10-CM | POA: Diagnosis not present

## 2022-04-14 DIAGNOSIS — R569 Unspecified convulsions: Secondary | ICD-10-CM | POA: Diagnosis not present

## 2022-04-14 DIAGNOSIS — I2510 Atherosclerotic heart disease of native coronary artery without angina pectoris: Secondary | ICD-10-CM | POA: Diagnosis present

## 2022-04-14 DIAGNOSIS — R531 Weakness: Secondary | ICD-10-CM | POA: Diagnosis not present

## 2022-04-14 DIAGNOSIS — Z981 Arthrodesis status: Secondary | ICD-10-CM

## 2022-04-14 DIAGNOSIS — R0602 Shortness of breath: Secondary | ICD-10-CM | POA: Diagnosis not present

## 2022-04-14 DIAGNOSIS — K5901 Slow transit constipation: Secondary | ICD-10-CM | POA: Diagnosis not present

## 2022-04-14 DIAGNOSIS — E860 Dehydration: Secondary | ICD-10-CM | POA: Diagnosis not present

## 2022-04-14 DIAGNOSIS — F03B0 Unspecified dementia, moderate, without behavioral disturbance, psychotic disturbance, mood disturbance, and anxiety: Secondary | ICD-10-CM | POA: Diagnosis not present

## 2022-04-14 DIAGNOSIS — R0902 Hypoxemia: Secondary | ICD-10-CM | POA: Diagnosis not present

## 2022-04-14 DIAGNOSIS — Z7982 Long term (current) use of aspirin: Secondary | ICD-10-CM

## 2022-04-14 DIAGNOSIS — I10 Essential (primary) hypertension: Secondary | ICD-10-CM | POA: Diagnosis not present

## 2022-04-14 DIAGNOSIS — H409 Unspecified glaucoma: Secondary | ICD-10-CM | POA: Diagnosis present

## 2022-04-14 DIAGNOSIS — Z86010 Personal history of colonic polyps: Secondary | ICD-10-CM

## 2022-04-14 DIAGNOSIS — K5900 Constipation, unspecified: Secondary | ICD-10-CM | POA: Diagnosis not present

## 2022-04-14 DIAGNOSIS — Z7189 Other specified counseling: Secondary | ICD-10-CM | POA: Diagnosis not present

## 2022-04-14 DIAGNOSIS — I63412 Cerebral infarction due to embolism of left middle cerebral artery: Secondary | ICD-10-CM | POA: Diagnosis present

## 2022-04-14 DIAGNOSIS — G459 Transient cerebral ischemic attack, unspecified: Secondary | ICD-10-CM | POA: Diagnosis not present

## 2022-04-14 DIAGNOSIS — G319 Degenerative disease of nervous system, unspecified: Secondary | ICD-10-CM | POA: Diagnosis not present

## 2022-04-14 LAB — TSH
TSH: 0.653 u[IU]/mL (ref 0.350–4.500)
TSH: 0.578 u[IU]/mL (ref 0.350–4.500)

## 2022-04-14 LAB — DIFFERENTIAL
Abs Immature Granulocytes: 0.02 10*3/uL (ref 0.00–0.07)
Basophils Absolute: 0.1 10*3/uL (ref 0.0–0.1)
Basophils Relative: 1 %
Eosinophils Absolute: 0.8 10*3/uL — ABNORMAL HIGH (ref 0.0–0.5)
Eosinophils Relative: 10 %
Immature Granulocytes: 0 %
Lymphocytes Relative: 25 %
Lymphs Abs: 1.9 10*3/uL (ref 0.7–4.0)
Monocytes Absolute: 0.7 10*3/uL (ref 0.1–1.0)
Monocytes Relative: 9 %
Neutro Abs: 4.2 10*3/uL (ref 1.7–7.7)
Neutrophils Relative %: 55 %

## 2022-04-14 LAB — RAPID URINE DRUG SCREEN, HOSP PERFORMED
Amphetamines: NOT DETECTED
Barbiturates: NOT DETECTED
Benzodiazepines: POSITIVE — AB
Cocaine: NOT DETECTED
Opiates: NOT DETECTED
Tetrahydrocannabinol: NOT DETECTED

## 2022-04-14 LAB — CBC
HCT: 40.1 % (ref 39.0–52.0)
Hemoglobin: 13 g/dL (ref 13.0–17.0)
MCH: 31 pg (ref 26.0–34.0)
MCHC: 32.4 g/dL (ref 30.0–36.0)
MCV: 95.5 fL (ref 80.0–100.0)
Platelets: 174 10*3/uL (ref 150–400)
RBC: 4.2 MIL/uL — ABNORMAL LOW (ref 4.22–5.81)
RDW: 14.8 % (ref 11.5–15.5)
WBC: 7.6 10*3/uL (ref 4.0–10.5)
nRBC: 0 % (ref 0.0–0.2)

## 2022-04-14 LAB — URINALYSIS, ROUTINE W REFLEX MICROSCOPIC
Bacteria, UA: NONE SEEN
Bilirubin Urine: NEGATIVE
Glucose, UA: NEGATIVE mg/dL
Hgb urine dipstick: NEGATIVE
Ketones, ur: 5 mg/dL — AB
Leukocytes,Ua: NEGATIVE
Nitrite: NEGATIVE
Protein, ur: 30 mg/dL — AB
Specific Gravity, Urine: 1.024 (ref 1.005–1.030)
pH: 5 (ref 5.0–8.0)

## 2022-04-14 LAB — COMPREHENSIVE METABOLIC PANEL
ALT: 45 U/L — ABNORMAL HIGH (ref 0–44)
AST: 73 U/L — ABNORMAL HIGH (ref 15–41)
Albumin: 3.1 g/dL — ABNORMAL LOW (ref 3.5–5.0)
Alkaline Phosphatase: 79 U/L (ref 38–126)
Anion gap: 6 (ref 5–15)
BUN: 14 mg/dL (ref 8–23)
CO2: 27 mmol/L (ref 22–32)
Calcium: 8.4 mg/dL — ABNORMAL LOW (ref 8.9–10.3)
Chloride: 107 mmol/L (ref 98–111)
Creatinine, Ser: 0.91 mg/dL (ref 0.61–1.24)
GFR, Estimated: >60 mL/min (ref >60)
Glucose, Bld: 98 mg/dL (ref 70–99)
Potassium: 3.6 mmol/L (ref 3.5–5.1)
Sodium: 140 mmol/L (ref 135–145)
Total Bilirubin: 0.9 mg/dL (ref 0.3–1.2)
Total Protein: 6.9 g/dL (ref 6.5–8.1)

## 2022-04-14 LAB — PROTIME-INR
INR: 1.1 (ref 0.8–1.2)
Prothrombin Time: 14 seconds (ref 11.4–15.2)

## 2022-04-14 LAB — SARS CORONAVIRUS 2 BY RT PCR: SARS Coronavirus 2 by RT PCR: NEGATIVE

## 2022-04-14 LAB — ETHANOL: Alcohol, Ethyl (B): <10 mg/dL (ref <10)

## 2022-04-14 LAB — VITAMIN B12: Vitamin B-12: 1819 pg/mL — ABNORMAL HIGH (ref 180–914)

## 2022-04-14 LAB — MAGNESIUM: Magnesium: 1.7 mg/dL (ref 1.7–2.4)

## 2022-04-14 LAB — CBG MONITORING, ED: Glucose-Capillary: 82 mg/dL (ref 70–99)

## 2022-04-14 MED ORDER — SODIUM CHLORIDE 0.9 % IV BOLUS
500.0000 mL | Freq: Once | INTRAVENOUS | Status: AC
  Administered 2022-04-14: 500 mL via INTRAVENOUS

## 2022-04-14 MED ORDER — SODIUM CHLORIDE 0.9 % IV SOLN
100.0000 mL/h | INTRAVENOUS | Status: DC
  Administered 2022-04-17: 100 mL/h via INTRAVENOUS

## 2022-04-14 MED ORDER — ONDANSETRON HCL 4 MG PO TABS: 4.0000 mg | ORAL_TABLET | Freq: Four times a day (QID) | ORAL | Status: DC | PRN

## 2022-04-14 MED ORDER — TRAZODONE HCL 50 MG PO TABS: 100.0000 mg | ORAL_TABLET | Freq: Every evening | ORAL | Status: DC | PRN

## 2022-04-14 MED ORDER — PANTOPRAZOLE SODIUM 40 MG PO TBEC
40.0000 mg | DELAYED_RELEASE_TABLET | Freq: Every day | ORAL | Status: DC
  Administered 2022-04-14 – 2022-04-16 (×3): 40 mg via ORAL
  Filled 2022-04-14 (×4): qty 1

## 2022-04-14 MED ORDER — LEVETIRACETAM 250 MG PO TABS
250.0000 mg | ORAL_TABLET | Freq: Every day | ORAL | Status: DC
  Administered 2022-04-14 – 2022-04-16 (×3): 250 mg via ORAL
  Filled 2022-04-14 (×4): qty 1

## 2022-04-14 MED ORDER — ALBUTEROL SULFATE (2.5 MG/3ML) 0.083% IN NEBU: 2.5000 mg | INHALATION_SOLUTION | Freq: Every day | RESPIRATORY_TRACT | Status: DC | PRN

## 2022-04-14 MED ORDER — HEPARIN SODIUM (PORCINE) 5000 UNIT/ML IJ SOLN
5000.0000 [IU] | Freq: Three times a day (TID) | INTRAMUSCULAR | Status: DC
  Administered 2022-04-14 – 2022-04-17 (×9): 5000 [IU] via SUBCUTANEOUS
  Filled 2022-04-14 (×9): qty 1

## 2022-04-14 MED ORDER — ACETAMINOPHEN 650 MG RE SUPP: 650.0000 mg | Freq: Four times a day (QID) | RECTAL | Status: DC | PRN

## 2022-04-14 MED ORDER — PAROXETINE HCL 20 MG PO TABS
40.0000 mg | ORAL_TABLET | Freq: Every day | ORAL | Status: DC
  Administered 2022-04-15 – 2022-04-16 (×2): 40 mg via ORAL
  Filled 2022-04-14 (×3): qty 2

## 2022-04-14 MED ORDER — ONDANSETRON HCL 4 MG/2ML IJ SOLN: 4.0000 mg | Freq: Four times a day (QID) | INTRAMUSCULAR | Status: DC | PRN

## 2022-04-14 MED ORDER — ASPIRIN 325 MG PO TBEC
325.0000 mg | DELAYED_RELEASE_TABLET | Freq: Every day | ORAL | Status: DC
  Administered 2022-04-14 – 2022-04-16 (×3): 325 mg via ORAL
  Filled 2022-04-14 (×4): qty 1

## 2022-04-14 MED ORDER — SODIUM CHLORIDE 0.9 % IV SOLN: INTRAVENOUS | Status: AC

## 2022-04-14 MED ORDER — ACETAMINOPHEN 325 MG PO TABS: 650.0000 mg | ORAL_TABLET | Freq: Four times a day (QID) | ORAL | Status: DC | PRN

## 2022-04-14 MED ORDER — FLUTICASONE PROPIONATE 50 MCG/ACT NA SUSP
2.0000 | Freq: Every day | NASAL | Status: DC
  Administered 2022-04-14 – 2022-04-18 (×5): 2 via NASAL
  Filled 2022-04-14: qty 16

## 2022-04-14 MED ORDER — ATORVASTATIN CALCIUM 40 MG PO TABS
40.0000 mg | ORAL_TABLET | Freq: Every day | ORAL | Status: DC
  Administered 2022-04-15 – 2022-04-16 (×2): 40 mg via ORAL
  Filled 2022-04-14 (×3): qty 1

## 2022-04-14 MED ORDER — SODIUM CHLORIDE 0.9 % IV BOLUS
1000.0000 mL | Freq: Once | INTRAVENOUS | Status: AC
  Administered 2022-04-14: 1000 mL via INTRAVENOUS

## 2022-04-14 MED ORDER — LUBIPROSTONE 24 MCG PO CAPS
24.0000 ug | ORAL_CAPSULE | Freq: Every day | ORAL | Status: DC
  Administered 2022-04-15 – 2022-04-16 (×2): 24 ug via ORAL
  Filled 2022-04-14 (×3): qty 1

## 2022-04-14 NOTE — ED Triage Notes (Signed)
Pt brought in by [facility] for c/o possible code stroke  When pt arrived pt was found to have weakness with increased weakness to right side. Caregiver reports pt has had steady decline in health x one week since being taken off benzos  Pt poor historian and unable to answer questions but does follow commands  Ems states pt was found to be at 84% on RA, when O2 applied at 2L via  pt increased to 95%  BP 154/72 HR 87

## 2022-04-14 NOTE — ED Notes (Signed)
Spoke with Philoma Benton (POA) who states she's coming to see pt after her appt.. Talked to Tommas Olp) - daughter , Talked to Blenda Peals- He has pt's "pocketbook".

## 2022-04-14 NOTE — ED Provider Notes (Signed)
[facility] EMERGENCY DEPARTMENT AT [facility] Provider Note   CSN: [id number] Arrival date & time: 04/14/22  6283     History  Chief Complaint  Patient presents with   Weakness    [name] is a 88 y.o. male.  Pt is a 88 yo male with pmhx significant for CVA, dementia, htn, sleep apnea, gerd, glaucoma, arthritis, hld, and cad.  Pt was taken off ativan by pcp about a week ago.  Pt has been worsening since then.  Pt was seen in the ED on 4/6 and was d/c with a librium taper.  Family said sx are not improving, so family called EMS.  EMS called a code stroke b/c he has weakness in his right arm.  However, sx have been going on for a week (according to [name], friend), so code stroke not called upon arrival here.  EMS also reports O2 sats on RA at 84%.  They applied 2L.  O2 sat now 91% on RA.  Pt is unable to give a good hx.       Home Medications Prior to Admission medications   Medication Sig Start Date End Date Taking? Authorizing Provider  albuterol (PROVENTIL) (2.5 MG/3ML) 0.083% nebulizer solution Take 3 mLs (2.5 mg total) by nebulization daily as needed for wheezing or shortness of breath. 03/28/22 06/26/22  Milian, Aleen Campi, FNP  aspirin EC 325 MG tablet Take 1 tablet (325 mg total) by mouth daily. 03/28/22   Milian, Aleen Campi, FNP  atorvastatin (LIPITOR) 40 MG tablet Take daily in the evening 03/28/22   Milian, Aleen Campi, FNP  chlordiazePOXIDE (LIBRIUM) 25 MG capsule  PO TID x 1D, then 25-50mg  PO BID X 1D, then 25-50mg  PO QD X 1D 04/08/22   Kommor, Madison, MD  cholecalciferol 25 MCG (1000 UT) tablet Take by mouth. 08/12/15   [provider]  donepezil (ARICEPT) 10 MG tablet Take Daily 03/28/22   Arrie Senate, FNP  fluticasone [facility]) 50 MCG/ACT nasal spray Place 2 sprays into both nostrils daily. 03/28/22 06/26/22  Arrie Senate, FNP  levETIRAcetam (KEPPRA) 250 MG tablet Needs to be seen by neuro 03/28/22   Arrie Senate, FNP  lubiprostone (AMITIZA) 24 MCG capsule Take 1 capsule (24 mcg total) by mouth daily. 03/28/22   Milian, Aleen Campi, FNP  nitroGLYCERIN (NITROSTAT) 0.4 MG SL tablet Place 1 tablet (0.4 mg total) under the tongue every 5 (five) minutes as needed for chest pain (Max of 3 doses at one time). PLACE 1 TAB UNDER TONGUE EVERY 5 MIN IF NEEDED FOR CHEST PAIN. MAY USE 3 TIMES If NO RELIEF CALL 911. 03/28/22   Arrie Senate, FNP  omeprazole (PRILOSEC) 20 MG capsule Take daily 03/28/22   Milian, Aleen Campi, FNP  PARoxetine (PAXIL) 40 MG tablet Take 1 tablet (40 mg total) by mouth daily. 03/28/22 06/26/22  Arrie Senate, FNP  Respiratory Therapy Supplies (NEBULIZER/TUBING/MOUTHPIECE) KIT Disp one nebulizer machine, tubing set and mouthpiece kit 08/17/17   Danelle Berry, PA-C  traZODone (DESYREL) 100 MG tablet TAKE (1) TABLET BY MOUTH AT BEDTIME AS NEEDED FOR SLEEP 03/28/22   Milian, Aleen Campi, FNP      Allergies    Sulfa antibiotics    Review of Systems   Review of Systems  Neurological:  Positive for weakness.  All other systems reviewed and are negative.   Physical Exam Updated Vital Signs BP (!) 187/79 (BP Location: Left Arm)   Pulse 94   Temp 97.6  F (36.4 C) (Oral)   Resp (!) 22   Ht 5\' 9"  (1.753 m)   Wt 54 kg   SpO2 99%   BMI 17.58 kg/m  Physical Exam Vitals and nursing note reviewed.  Constitutional:      Appearance: Normal appearance.  HENT:     Head: Normocephalic and atraumatic.     Right Ear: External ear normal.     Left Ear: External ear normal.     Nose: Nose normal.     Mouth/Throat:     Mouth: Mucous membranes are dry.  Eyes:     Extraocular Movements: Extraocular movements intact.     Conjunctiva/sclera: Conjunctivae normal.     Pupils: Pupils are equal, round, and reactive to light.  Cardiovascular:     Rate and Rhythm: Normal rate and regular rhythm.     Pulses: Normal pulses.     Heart sounds: Normal heart sounds.   Pulmonary:     Effort: Pulmonary effort is normal.     Breath sounds: Normal breath sounds.  Abdominal:     General: Abdomen is flat. Bowel sounds are normal.     Palpations: Abdomen is soft.  Musculoskeletal:        General: Normal range of motion.     Cervical back: Normal range of motion and neck supple.  Skin:    General: Skin is warm.     Capillary Refill: Capillary refill takes less than 2 seconds.  Neurological:     Mental Status: He is alert. He is confused.     Comments: Right arm weakness (pt has to pick up arm with left hand to lift it); Pt following all simple commands.   Psychiatric:        Mood and Affect: Mood normal.        Behavior: Behavior normal.     ED Results / Procedures / Treatments   Labs (all labs ordered are listed, but only abnormal results are displayed) Labs Reviewed  CBC - Abnormal; Notable for the following components:      Result Value   RBC 4.20 (*)    All other components within normal limits  DIFFERENTIAL - Abnormal; Notable for the following components:   Eosinophils Absolute 0.8 (*)    All other components within normal limits  COMPREHENSIVE METABOLIC PANEL - Abnormal; Notable for the following components:   Calcium 8.4 (*)    Albumin 3.1 (*)    AST 73 (*)    ALT 45 (*)    All other components within normal limits  RAPID URINE DRUG SCREEN, HOSP PERFORMED - Abnormal; Notable for the following components:   Benzodiazepines POSITIVE (*)    All other components within normal limits  URINALYSIS, ROUTINE W REFLEX MICROSCOPIC - Abnormal; Notable for the following components:   Ketones, ur 5 (*)    Protein, ur 30 (*)    All other components within normal limits  SARS CORONAVIRUS 2 BY RT PCR  ETHANOL  PROTIME-INR  MAGNESIUM  TSH  TSH  VITAMIN B12  VITAMIN D 25 HYDROXY (VIT D DEFICIENCY, FRACTURES)  CBG MONITORING, ED    EKG EKG Interpretation  Date/Time:  Friday April 14 2022 14:25:48 EDT Ventricular Rate:  54 PR  Interval:  302 QRS Duration: 90 QT Interval:  506 QTC Calculation: 480 R Axis:   77 Text Interpretation: Second degree AV block, Mobitz II Borderline prolonged QT interval new from earlier Confirmed by Jacalyn Lefevre [phone]) on 04/14/2022 2:32:43 PM  Radiology MR BRAIN  WO CONTRAST  Result Date: 04/14/2022 CLINICAL DATA:  Neuro deficit, acute, stroke suspected EXAM: MRI HEAD WITHOUT CONTRAST TECHNIQUE: Multiplanar, multiecho pulse sequences of the brain and surrounding structures were obtained without intravenous contrast. COMPARISON:  Same day CT head. FINDINGS: Brain: No acute infarction, acute hemorrhage, hydrocephalus, extra-axial collection or mass lesion. Many remote infarcts in the cerebellum. Remote left frontal infarct. Patchy T2/FLAIR hyperintensities in the white matter, compatible with chronic microvascular ischemic disease. Cerebral atrophy. Vascular: Major arterial flow voids are maintained at the skull base. Skull and upper cervical spine: Normal marrow signal. Sinuses/Orbits: Clear sinuses.  No acute orbital findings. Other: No mastoid effusions. IMPRESSION: 1. No evidence of acute abnormality. 2. Remote infarcts. Electronically Signed   By: Feliberto Harts M.D.   On: 04/14/2022 11:46   DG Chest Portable 1 View  Result Date: 04/14/2022 CLINICAL DATA:  Shortness of breath. EXAM: PORTABLE CHEST 1 VIEW COMPARISON:  Chest radiographs 09/28/2021 and 08/24/2021. Chest CT 10/22/2018. FINDINGS: The cardiomediastinal silhouette is unchanged with normal heart size. A calcified lymph node is again noted in the AP window. Background fibrotic interstitial lung disease is again noted. Right upper lobe opacity appears increased compared to the 09/28/2021 radiograph but is more similar to the 08/24/2021 study, while densities elsewhere bilaterally are similar to the prior radiograph. No sizable pleural effusion or pneumothorax is identified. No acute osseous abnormality is seen. IMPRESSION: Chronic  fibrotic interstitial lung disease. Mildly increased right upper lobe opacity could reflect differences in technique versus superimposed infection. Electronically Signed   By: Sebastian Ache M.D.   On: 04/14/2022 10:46   CT HEAD WO CONTRAST  Result Date: 04/14/2022 CLINICAL DATA:  Acute neuro deficit, stroke suspected. Weakness on the right side. EXAM: CT HEAD WITHOUT CONTRAST TECHNIQUE: Contiguous axial images were obtained from the base of the skull through the vertex without intravenous contrast. RADIATION DOSE REDUCTION: This exam was performed according to the departmental dose-optimization program which includes automated exposure control, adjustment of the mA and/or kV according to patient size and/or use of iterative reconstruction technique. COMPARISON:  CT examination dated September 28, 2021 FINDINGS: Brain: No evidence of acute infarction, hemorrhage, hydrocephalus, extra-axial collection or mass lesion/mass effect. Moderate generalized cerebral atrophy and advanced chronic microvascular ischemic changes of the white matter, unchanged. Encephalomalacia of the right cerebellum suggesting chronic infarcts is also unchanged. Vascular: No hyperdense vessel or unexpected calcification. Skull: Normal. Negative for fracture or focal lesion. Sinuses/Orbits: No acute finding. Other: None. IMPRESSION: 1. No acute intracranial abnormality. 2. Moderate generalized cerebral and cerebellar atrophy and advanced chronic microvascular ischemic changes of the white matter, unchanged. 3. Chronic right cerebellar infarcts, unchanged. Electronically Signed   By: Larose Hires D.O.   On: 04/14/2022 10:34    Procedures Procedures    Medications Ordered in ED Medications  sodium chloride 0.9 % bolus 500 mL (500 mLs Intravenous Bolus 04/14/22 1015)    Followed by  0.9 %  sodium chloride infusion (0 mL/hr Intravenous Hold 04/14/22 1015)  sodium chloride 0.9 % bolus 1,000 mL (1,000 mLs Intravenous New Bag/Given 04/14/22  1230)    ED Course/ Medical Decision Making/ A&P                             Medical Decision Making Amount and/or Complexity of Data Reviewed Labs: ordered. Radiology: ordered.  Risk Prescription drug management. Decision regarding hospitalization.   This patient presents to the ED for concern of ams, this involves  an extensive number of treatment options, and is a complaint that carries with it a high risk of complications and morbidity.  The differential diagnosis includes cva, infection, electrolyte abn   Co morbidities that complicate the patient evaluation   CVA, dementia, htn, sleep apnea, gerd, glaucoma, arthritis, hld, and cad.   Additional history obtained:  Additional history obtained from epic chart review External records from outside source obtained and reviewed including EMS report, family   Lab Tests:  I Ordered, and personally interpreted labs.  The pertinent results include:  cbc nl, cmp nl, ua nl other than ketones and protein, uds + bzd   Imaging Studies ordered:  I ordered imaging studies including cxr, ct head, mri  I independently visualized and interpreted imaging which showed  CXR: Chronic fibrotic interstitial lung disease. Mildly increased right  upper lobe opacity could reflect differences in technique versus  superimposed infection.  CT head:  No acute intracranial abnormality.  2. Moderate generalized cerebral and cerebellar atrophy and advanced  chronic microvascular ischemic changes of the white matter,  unchanged.  3. Chronic right cerebellar infarcts, unchanged.  MRI: No evidence of acute abnormality.  2. Remote infarcts.   I agree with the radiologist interpretation   Cardiac Monitoring:  The patient was maintained on a cardiac monitor.  I personally viewed and interpreted the cardiac monitored which showed an underlying rhythm of: nsr   Medicines ordered and prescription drug management:  I ordered medication  including ivfs  for sx  Reevaluation of the patient after these medicines showed that the patient improved I have reviewed the patients home medicines and have made adjustments as needed   Test Considered:  mri   Critical Interventions:  ivfs   Consultations Obtained:  I requested consultation with the cardiologist (Dr. Wyline Mood),  and discussed lab and imaging findings as well as pertinent plan - he will see pt in consult Pt d/w Dr. Gwenlyn Perking (triad) who will admit   Problem List / ED Course:  AMS:  he is more alert after IVFs, but is still weak and confused.  Unfortunately, family has not been answering their phones and they are not here.  It is somewhat unclear his normal status.   Heart block:  Dr. Wyline Mood (cards consulted).  He looked at telemetry and does not think he has a high grade heart block.    Reevaluation:  After the interventions noted above, I reevaluated the patient and found that they have :improved   Social Determinants of Health:  Lives at home   Dispostion:  After consideration of the diagnostic results and the patients response to treatment, I feel that the patent would benefit from admission.          Final Clinical Impression(s) / ED Diagnoses Final diagnoses:  Dehydration  Moderate dementia without behavioral disturbance, psychotic disturbance, mood disturbance, or anxiety, unspecified dementia type  Bradycardia    Rx / DC Orders ED Discharge Orders     None         Jacalyn Lefevre, MD 04/14/22 1515

## 2022-04-14 NOTE — Assessment & Plan Note (Signed)
-  No significant abnormalities or concerns as per cardiology evaluation/inputs. -Closely monitoring electrolytes, and TSH -Aricept will be discontinued at the moment.. -Continue telemetry monitoring -Maintain adequate hydration response.

## 2022-04-14 NOTE — Assessment & Plan Note (Addendum)
-  CT scan of the head and MRI negative -Patient confusion status.  Associated with further deconditioning, dehydration and decline with underlying progression of his dementia. -Lasix will be discontinued -Continue supportive care, assistance and fluid resuscitation -Will check TSH, B12 and vitamin D. -Continue aspirin for secondary prevention.

## 2022-04-14 NOTE — Progress Notes (Signed)
Asked to review telemetry by ER staff regarding bradycardia, concerns for possible high grade heart blocker. On review sinus rhythm with fairly frequent nonconducted PACs (noted irregularity in the Twave followed by pause) with compensatory pauses, there is also some mobtiz I block at times, rare junctional escape beats. Patient has been sleeping on and off in the ER which may be playing some role, on my briefly speaking with him awake he is in SR in the 60s to 70s. No evidence of high degree heart block. Not on any av nodal agents, is on aricept which is associated with bradycardia  Essentially no worrisome findings on tele review. Just in general poor functional status and dementia would be a fairly high threshold to ever consider a pacemaker. If issues with ongoing bradycardia during admission may have to come off aricept. F/u electrolytes, TSH. No further cardiac testing or interventions are indicated at this time   Dina Rich MD

## 2022-04-14 NOTE — Assessment & Plan Note (Addendum)
-  Continue as needed bronchodilator management and the use of Flonase -No acute exacerbation currently appreciated. -Follow clinical status.

## 2022-04-14 NOTE — Progress Notes (Signed)
Admitted from ED.  Alert and oriented x 2 or 3.  New name , birth date and that was at [facility] but when asked what year it is stated birthday. Speech is unclear. Dressing applied to sacrum and no skin breakdown noted. Some small abrasions to lower legs and corn on second left toe. Gown changed and tele box applied. Sinus brady

## 2022-04-14 NOTE — Assessment & Plan Note (Signed)
-  Vital signs stable -Prior to admission no using any antihypertensive agents -Continue monitoring.

## 2022-04-14 NOTE — H&P (Signed)
History and Physical    Patient: [name] EXH:[id number] DOB: March 09, 1934 DOA: 04/14/2022 DOS: the patient was seen and examined on 04/14/2022 PCP: Arrie Senate, FNP  Patient coming from: Home  Chief Complaint:  Chief Complaint  Patient presents with   Weakness   HPI: [name] is a 88 y.o. male with medical history significant of advanced dementia, gastroesophageal flux disease, hypertension, prior history of stroke, COPD, hyperlipidemia and constipation; who presented to the hospital secondary to worsening confusion and concern for acute stroke. At home patient was demonstrating right-sided weakness; he is mainly wheelchair dependent and have baseline left-sided hemiplegia from previous stroke.  Per family he has been struggling with mentation and decreased oral intake for the last 2 weeks or so after he was taken off benzodiazepine therapy.  Patient with recent visit to the emergency department given concern for benzo withdrawal treated with Librium tapering; no signs of withdrawal on examination. CT scan negative for acute abnormality and MRI negative for acute stroke.  Remote infarcts appreciated. There has not been complaints of seizure activity and there is no signs of acute infection. Mild dehydration suspected and while in the ED telemetry was able to capture some abnormal rate and pulses. Patient seen by cardiology and no concerns for further workup or intervention at the moment.  Recommendations given to stop Aricept.  -[facility] has been contacted to place patient in the hospital for further evaluation and management.  Review of Systems: unable to review all systems due to the inability of the patient to answer questions. Past Medical History:  Diagnosis Date   Abnormality of gait 08/01/2013   Arthritis    Blindness of left eye    Posttraumatic   Carotid artery disease    Colon polyp    Constipation    Diaphragmatic hernia    Disc disease, degenerative, cervical     Diverticulitis    ED (erectile dysfunction)    Gastroparesis    GERD (gastroesophageal reflux disease)    Glaucoma    Heart disease    Hyperlipidemia    Hypertension    Insomnia    Memory difficulty 08/04/2014   Nicotine dependence    Numbness and tingling of right arm 08/04/2014   Osteoarthritis    Overactive bladder    Radiculopathy    Shortness of breath    with activity   Sleep apnea    does not use CPAP.  Tested maybe 5- 10 years ago   Stroke    Left side weakness.   Unilateral inguinal hernia    Past Surgical History:  Procedure Laterality Date   ANTERIOR CERVICAL DECOMP/DISCECTOMY FUSION N/A 07/01/2012   Procedure: ANTERIOR CERVICAL DECOMPRESSION/DISCECTOMY FUSION CERVICAL FIVE-SIX [name] REMOVAL CERVICAL SIX-SEVEN;  Surgeon: Temple Pacini, MD;  Location: [facility];  Service: Neurosurgery;  Laterality: N/A;   CERVICAL FUSION     COLONOSCOPY   08/04/2002     RMR: Normal rectum/Pancolonic diverticula/Colonic polyps as described above, biopsied and/or snared   COLONOSCOPY  60/08/2009   RMR: normal rectum/left and right sided diverticula/multiple colonic polyps. tubular adenomas. surveillance due 2014   COLONOSCOPY WITH ESOPHAGOGASTRODUODENOSCOPY (EGD) N/A 12/18/2012   Dr. Jena Gauss:  Colonic diverticulosis. Multiple colonic polyps-hyperplastic polyps and tubular adenoma. Friable anal canal hemorrhoids. EGD with chronic atrophic gastritis   ESOPHAGOGASTRODUODENOSCOPY  04/14/2002   FYB:OFBPZWCH'E' ring, status post dilation as described above/Hiatal hernia, focal antral erosions of uncertain clinical significance   ESOPHAGOGASTRODUODENOSCOPY  06/09/2009   RMR: normal esophagus s/p dilator/small hiatal hernia  otherwise normal   HERNIA REPAIR Right    inguinal   I & D EXTREMITY Right 04/23/2017   Procedure: IRRIGATION AND DEBRIDEMENT HAND;  Surgeon: Sheral Apley, MD;  Location: [facility] OR;  Service: Orthopedics;  Laterality: Right;   INGUINAL HERNIA REPAIR Left 04/15/2014    Procedure: LEFT INGUINAL HERNIORRHAPHY WITH MESH;  Surgeon: Franky Macho Md, MD;  Location: [facility];  Service: General;  Laterality: Left;   INSERTION OF MESH Left 04/15/2014   Procedure: INSERTION OF MESH;  Surgeon: Franky Macho Md, MD;  Location: [facility];  Service: General;  Laterality: Left;   Social History:  reports that he has been smoking cigarettes. He has a 15.00 pack-year smoking history. He has never used smokeless tobacco. He reports that he does not drink alcohol and does not use drugs.  Allergies  Allergen Reactions   Sulfa Antibiotics     Family History  Problem Relation Age of Onset   Cancer Mother    Dementia Father    Colon cancer Neg Hx     Prior to Admission medications   Medication Sig Start Date End Date Taking? Authorizing Provider  albuterol (PROVENTIL) (2.5 MG/3ML) 0.083% nebulizer solution Take 3 mLs (2.5 mg total) by nebulization daily as needed for wheezing or shortness of breath. 03/28/22 06/26/22  Milian, Aleen Campi, FNP  aspirin EC 325 MG tablet Take 1 tablet (325 mg total) by mouth daily. 03/28/22   Milian, Aleen Campi, FNP  atorvastatin (LIPITOR) 40 MG tablet Take daily in the evening 03/28/22   Milian, Aleen Campi, FNP  chlordiazePOXIDE (LIBRIUM) 25 MG capsule  PO TID x 1D, then 25-50mg  PO BID X 1D, then 25-50mg  PO QD X 1D 04/08/22   Kommor, Madison, MD  cholecalciferol 25 MCG (1000 UT) tablet Take by mouth. 08/12/15   [provider]  donepezil (ARICEPT) 10 MG tablet Take Daily 03/28/22   Arrie Senate, FNP  fluticasone [facility]) 50 MCG/ACT nasal spray Place 2 sprays into both nostrils daily. 03/28/22 06/26/22  Arrie Senate, FNP  levETIRAcetam (KEPPRA) 250 MG tablet Needs to be seen by neuro 03/28/22   Arrie Senate, FNP  lubiprostone (AMITIZA) 24 MCG capsule Take 1 capsule (24 mcg total) by mouth daily. 03/28/22   Milian, Aleen Campi, FNP  nitroGLYCERIN (NITROSTAT) 0.4 MG SL tablet Place 1 tablet (0.4 mg  total) under the tongue every 5 (five) minutes as needed for chest pain (Max of 3 doses at one time). PLACE 1 TAB UNDER TONGUE EVERY 5 MIN IF NEEDED FOR CHEST PAIN. MAY USE 3 TIMES If NO RELIEF CALL 911. 03/28/22   Arrie Senate, FNP  omeprazole (PRILOSEC) 20 MG capsule Take daily 03/28/22   Milian, Aleen Campi, FNP  PARoxetine (PAXIL) 40 MG tablet Take 1 tablet (40 mg total) by mouth daily. 03/28/22 06/26/22  Arrie Senate, FNP  Respiratory Therapy Supplies (NEBULIZER/TUBING/MOUTHPIECE) KIT Disp one nebulizer machine, tubing set and mouthpiece kit 08/17/17   Danelle Berry, PA-C  traZODone (DESYREL) 100 MG tablet TAKE (1) TABLET BY MOUTH AT BEDTIME AS NEEDED FOR SLEEP 03/28/22   Arrie Senate, FNP    Physical Exam: Vitals:   04/14/22 1500 04/14/22 1630 04/14/22 1700 04/14/22 1851  BP: (!) 172/76 (!) 172/82 (!) 176/75 (!) 171/73  Pulse: (!) 55 62 (!) 55 (!) 57  Resp: Temp:    (!) 97.5 F (36.4 C)  TempSrc:      SpO2: 96% 96% 91% 100%  Weight:  Height:       General exam: Chronically ill in appearance, overweight and deconditioned.  Alert, awake, oriented x 1 intermittently; advanced dementia has been documented on his records.  Patient is afebrile, no chest pain, no nausea or vomiting. Respiratory system: Clear to auscultation. Respiratory effort normal.  Good saturation on room air at time of examination. Cardiovascular system: Rate controlled (with episodes of bradycardia while falling asleep); no rubs, no gallops,. Gastrointestinal system: Abdomen is nondistended, soft and nontender. No organomegaly or masses felt. Normal bowel sounds heard. Central nervous system: No new focal deficits appreciated on exam. Extremities: No cyanosis or clubbing. Skin: No petechiae. Psychiatry: Judgement and insight appear impaired secondary to dementia.  Flat affect appreciated.  Data Reviewed: CBG: 82 Alcohol level: Less than 10 INR: 1.1; prothrombin time  14 CBC: WBC 7.6, hemoglobin 13, platelet count 124. Comprehensive metabolic panel: Sodium 140, potassium 3.6, chloride 107, BUN 14, creatinine 0.91, AST 73, ALT 45 and GFR more than 60. Urine drug screen: Positive for benzos; patient has just finished Librium tapering. Urinalysis: Specific gravity 1.24, pH 5.0 no nitrite, no leukocytes, protein 30, no bacteria. SARS coronavirus by PCR: Negative  Assessment and Plan: * Bradycardia -No significant abnormalities or concerns as per cardiology evaluation/inputs. -Closely monitoring electrolytes, and TSH -Aricept will be discontinued at the moment.. -Continue telemetry monitoring -Maintain adequate hydration response.  Chronic obstructive pulmonary disease, unspecified COPD type -As needed bronchodilator management and the use of Flonase -No acute exacerbation currently appreciated. -Good saturation appreciated on room air.  Anxiety -Continue the use of Paxil  Cerebrovascular accident (CVA) due to embolism of left middle cerebral artery -CT scan of the head and MRI negative -Patient confusion status.  Associated with further deconditioning, dehydration and decline with underlying progression of his dementia. -Lasix will be discontinued -Continue supportive care, assistance and fluid resuscitation -Will check TSH, B12 and vitamin D. -Continue aspirin for secondary prevention.  Seizure -Resume the use of Keppra.  Protein-calorie malnutrition -Body mass index is 17.58 kg/m. -Severe protein calorie malnutrition -Will liberalize diet -Encourage oral intake and use as needed feeding supplements.  Chronic insomnia -Continue as needed trazodone.  Constipation -Continue the use of Amitiza -Maintain adequate hydration -Follow response and follow up response and further add bowel regimen meds as needed.  GERD -Continue PPI.  Primary hypertension -Vital signs stable -Prior to admission no using any antihypertensive  agents -Continue monitoring.      Advance Care Planning:   Code Status: DNR   Consults: cardiology service  Family Communication: No family at bedside.  Severity of Illness: The appropriate patient status for this patient is OBSERVATION. Observation status is judged to be reasonable and necessary in order to provide the required intensity of service to ensure the patient's safety. The patient's presenting symptoms, physical exam findings, and initial radiographic and laboratory data in the context of their medical condition is felt to place them at decreased risk for further clinical deterioration. Furthermore, it is anticipated that the patient will be medically stable for discharge from the hospital within 2 midnights of admission.   Author: Vassie Loll, MD 04/14/2022 7:13 PM  For on call review [email or web address].

## 2022-04-14 NOTE — Assessment & Plan Note (Signed)
Continue PPI ?

## 2022-04-14 NOTE — Assessment & Plan Note (Signed)
-  Continue the use of Paxil

## 2022-04-14 NOTE — Assessment & Plan Note (Signed)
-  Resume the use of Keppra.

## 2022-04-14 NOTE — Assessment & Plan Note (Signed)
Continue as needed trazodone. 

## 2022-04-14 NOTE — Assessment & Plan Note (Signed)
-  Body mass index is 17.58 kg/m. -Severe protein calorie malnutrition -Will liberalize diet -Encourage oral intake and use as needed feeding supplements.

## 2022-04-14 NOTE — Transitions of Care (Post Inpatient/ED Visit) (Unsigned)
   04/14/2022  Name: [name] MRN: [id number] DOB: Feb 21, 1934  Chart review for ED EMMI Flag for discharge on 04/09/22. Patient is currently in the ED. No follow-up call made at this time.   Demetrios Loll, BSN, RN-BC RN Care Coordinator [facility]  [facility] Direct Dial: [phone] Main #: [phone]

## 2022-04-14 NOTE — ED Notes (Signed)
Patient transported to MRI 

## 2022-04-14 NOTE — Assessment & Plan Note (Signed)
-  Continue the use of Amitiza -Maintain adequate hydration -Follow response and follow up response and further add bowel regimen meds as needed.

## 2022-04-15 DIAGNOSIS — E860 Dehydration: Secondary | ICD-10-CM | POA: Diagnosis not present

## 2022-04-15 DIAGNOSIS — R001 Bradycardia, unspecified: Secondary | ICD-10-CM | POA: Diagnosis not present

## 2022-04-15 DIAGNOSIS — E43 Unspecified severe protein-calorie malnutrition: Secondary | ICD-10-CM | POA: Diagnosis not present

## 2022-04-15 DIAGNOSIS — K2100 Gastro-esophageal reflux disease with esophagitis, without bleeding: Secondary | ICD-10-CM | POA: Diagnosis not present

## 2022-04-15 LAB — BASIC METABOLIC PANEL
Anion gap: 6 (ref 5–15)
BUN: 9 mg/dL (ref 8–23)
CO2: 24 mmol/L (ref 22–32)
Calcium: 8.1 mg/dL — ABNORMAL LOW (ref 8.9–10.3)
Chloride: 107 mmol/L (ref 98–111)
Creatinine, Ser: 0.72 mg/dL (ref 0.61–1.24)
GFR, Estimated: >60 mL/min (ref >60)
Glucose, Bld: 81 mg/dL (ref 70–99)
Potassium: 3.2 mmol/L — ABNORMAL LOW (ref 3.5–5.1)
Sodium: 137 mmol/L (ref 135–145)

## 2022-04-15 LAB — CBC
HCT: 41.5 % (ref 39.0–52.0)
Hemoglobin: 13.5 g/dL (ref 13.0–17.0)
MCH: 30.9 pg (ref 26.0–34.0)
MCHC: 32.5 g/dL (ref 30.0–36.0)
MCV: 95 fL (ref 80.0–100.0)
Platelets: 154 10*3/uL (ref 150–400)
RBC: 4.37 MIL/uL (ref 4.22–5.81)
RDW: 14.1 % (ref 11.5–15.5)
WBC: 6.5 10*3/uL (ref 4.0–10.5)
nRBC: 0 % (ref 0.0–0.2)

## 2022-04-15 LAB — VITAMIN D 25 HYDROXY (VIT D DEFICIENCY, FRACTURES): Vit D, 25-Hydroxy: 40.51 ng/mL (ref 30–100)

## 2022-04-15 LAB — MAGNESIUM: Magnesium: 1.6 mg/dL — ABNORMAL LOW (ref 1.7–2.4)

## 2022-04-15 LAB — PHOSPHORUS: Phosphorus: 2.7 mg/dL (ref 2.5–4.6)

## 2022-04-15 MED ORDER — MAGNESIUM SULFATE IN D5W 1-5 GM/100ML-% IV SOLN
1.0000 g | Freq: Once | INTRAVENOUS | Status: AC
  Administered 2022-04-15: 1 g via INTRAVENOUS
  Filled 2022-04-15: qty 100

## 2022-04-15 MED ORDER — POTASSIUM CHLORIDE 10 MEQ/100ML IV SOLN
10.0000 meq | INTRAVENOUS | Status: AC
  Administered 2022-04-15 (×2): 10 meq via INTRAVENOUS
  Filled 2022-04-15 (×2): qty 100

## 2022-04-15 NOTE — Evaluation (Signed)
Physical Therapy Evaluation Patient Details Name: [name] MRN: [id number] DOB: [birth year] Today's Date: 04/15/2022  History of Present Illness  Platon Arocho is a 88 y.o. male with medical history significant of advanced dementia, gastroesophageal flux disease, hypertension, prior history of stroke, COPD, hyperlipidemia and constipation; who presented to the hospital secondary to worsening confusion and concern for acute stroke.  At home patient was demonstrating right-sided weakness; he is mainly wheelchair dependent and have baseline left-sided hemiplegia from previous stroke.  Per family he has been struggling with mentation and decreased oral intake for the last 2 weeks or so after he was taken off benzodiazepine therapy.  Patient with recent visit to the emergency department given concern for benzo withdrawal treated with Librium tapering; no signs of withdrawal on examination.  CT scan negative for acute abnormality and MRI negative for acute stroke.  Remote infarcts appreciated.  There has not been complaints of seizure activity and there is no signs of acute infection.   Clinical Impression  Patient demonstrates slow labored movement for sitting up at bedside with limited use of BUE due to weakness, once seated frequent leaning/falling backwards that worsened after fatigued, limited to partial standing using RW, but unable to fully extend knees due to BLE weakness.  Patient required Max assist to reposition when put back to bed.  Patient will benefit from continued skilled physical therapy in hospital and recommended venue below to increase strength, balance, endurance for safe ADLs and gait.         Recommendations for follow up therapy are one component of a multi-disciplinary discharge planning process, led by the attending physician.  Recommendations may be updated based on patient status, additional functional criteria and insurance authorization.  Follow Up Recommendations Can patient  physically be transported by private vehicle: No     Assistance Recommended at Discharge Intermittent Supervision/Assistance  Patient can return home with the following  A lot of help with bathing/dressing/bathroom;A lot of help with walking and/or transfers;Help with stairs or ramp for entrance;Assistance with cooking/housework    Equipment Recommendations None recommended by PT  Recommendations for Other Services       Functional Status Assessment Patient has had a recent decline in their functional status and demonstrates the ability to make significant improvements in function in a reasonable and predictable amount of time.     Precautions / Restrictions Precautions Precautions: Fall Restrictions Weight Bearing Restrictions: No      Mobility  Bed Mobility Overal bed mobility: Needs Assistance Bed Mobility: Supine to Sit, Sit to Supine     Supine to sit: Mod assist, Max assist Sit to supine: Mod assist, Max assist   General bed mobility comments: slow labored movement    Transfers Overall transfer level: Needs assistance Equipment used: Rolling walker (2 wheels) Transfers: Sit to/from Stand Sit to Stand: Max assist           General transfer comment: unable to maintain standing balance due to BLE weakness with knees buckling    Ambulation/Gait                  Stairs            Wheelchair Mobility    Modified Rankin (Stroke Patients Only)       Balance Overall balance assessment: Needs assistance Sitting-balance support: Feet supported, No upper extremity supported Sitting balance-Leahy Scale: Poor Sitting balance - Comments: seated at EOB Postural control: Posterior lean Standing balance support: Reliant on assistive device for balance, During  functional activity, Bilateral upper extremity supported Standing balance-Leahy Scale: Poor Standing balance comment: using RW                             Pertinent Vitals/Pain  Pain Assessment Pain Assessment: No/denies pain    Home Living Family/patient expects to be discharged to:: Private residence Living Arrangements: Other relatives Available Help at Discharge: Family;Available 24 hours/day Type of Home: House Home Access: Level entry       Home Layout: One level Home Equipment: Wheelchair - manual;Rollator (4 wheels);Grab bars - tub/shower;Shower seat;BSC/3in1      Prior Function Prior Level of Function : Needs assist       Physical Assist : Mobility (physical);ADLs (physical) Mobility (physical): Bed mobility;Transfers;Gait;Stairs   Mobility Comments: Assisted transfers, mostly wheelchair bound, patient states he could walk short distances using RW ADLs Comments: assisted by family and home aide     Hand Dominance   Dominant Hand: Right    Extremity/Trunk Assessment   Upper Extremity Assessment Upper Extremity Assessment: Generalized weakness    Lower Extremity Assessment Lower Extremity Assessment: Generalized weakness;RLE deficits/detail;LLE deficits/detail RLE Deficits / Details: grossly -3/5 except ankle dorsiflexion 1/5 RLE Sensation: decreased proprioception RLE Coordination: decreased gross motor;decreased fine motor LLE Deficits / Details: grossly 2+/5 except ankle dorsiflexion 0/5 LLE Sensation: decreased proprioception;decreased light touch LLE Coordination: decreased gross motor;decreased fine motor    Cervical / Trunk Assessment Cervical / Trunk Assessment: Kyphotic  Communication   Communication: No difficulties  Cognition Arousal/Alertness: Awake/alert Behavior During Therapy: WFL for tasks assessed/performed Overall Cognitive Status: History of cognitive impairments - at baseline                                          General Comments      Exercises     Assessment/Plan    PT Assessment Patient needs continued PT services  PT Problem List Decreased strength;Decreased activity  tolerance;Decreased balance;Decreased mobility       PT Treatment Interventions DME instruction;Gait training;Stair training;Functional mobility training;Therapeutic activities;Therapeutic exercise;Balance training;Patient/family education    PT Goals (Current goals can be found in the Care Plan section)  Acute Rehab PT Goals Patient Stated Goal: return home PT Goal Formulation: With patient Time For Goal Achievement: 04/29/22 Potential to Achieve Goals: Fair    Frequency Min 3X/week     Co-evaluation               AM-PAC PT "6 Clicks" Mobility  Outcome Measure Help needed turning from your back to your side while in a flat bed without using bedrails?: A Lot Help needed moving from lying on your back to sitting on the side of a flat bed without using bedrails?: A Lot Help needed moving to and from a bed to a chair (including a wheelchair)?: Total Help needed standing up from a chair using your arms (e.g., wheelchair or bedside chair)?: A Lot Help needed to walk in hospital room?: Total Help needed climbing 3-5 steps with a railing? : Total 6 Click Score: 9    End of Session   Activity Tolerance: Patient tolerated treatment well;Patient limited by fatigue Patient left: in bed;with call bell/phone within reach Nurse Communication: Mobility status PT Visit Diagnosis: Unsteadiness on feet (R26.81);Other abnormalities of gait and mobility (R26.89);Muscle weakness (generalized) (M62.81)    Time: 9741-6384 PT Time Calculation (min) (  ACUTE ONLY): 22 min   Charges:   PT Evaluation $PT Eval Moderate Complexity: 1 Mod PT Treatments $Therapeutic Activity: 8-22 mins        12:43 PM, 04/15/22 Ocie Bob, MPT Physical Therapist with [facility] [phone] office [phone] mobile phone

## 2022-04-15 NOTE — Care Management Obs Status (Signed)
MEDICARE OBSERVATION STATUS NOTIFICATION   Patient Details  Name: [name] MRN: [id number] Date of Birth: 05/19/1934   Medicare Observation Status Notification Given:  Yes    [name], LCSW 04/15/2022, 2:05 PM

## 2022-04-15 NOTE — TOC Initial Note (Addendum)
Transition of Care [facility]) - Initial/Assessment Note    Patient Details  Name: [name] MRN: [id number] Date of Birth: 1934/12/05  Transition of Care (TOC) CM/SW Contact:    Catalina Gravel, LCSW Phone Number: 04/15/2022, 10:38 AM  Clinical Narrative:                 .. Transition of Care [facility]) Screening Note   Patient Details  Name: [name] Date of Birth: 10-Jan-1934   Transition of Care (TOC) CM/SW Contact:    Catalina Gravel, LCSW Phone Number: 04/15/2022, 10:39 AM   PT recommended SNF CSW spoke with , guardian and she is in agreement. PASSR completed and FL2.  Family does not want [facility]. Prior to hospitalization pt had a home RN M-S through [facility].      Barriers to Discharge: No Barriers Identified   Patient Goals and CMS Choice            Expected Discharge Plan and Services                                              Prior Living Arrangements/Services                       Activities of Daily Living      Permission Sought/Granted                  Emotional Assessment              Admission diagnosis:  Dehydration [E86.0] Bradycardia [R00.1] Moderate dementia without behavioral disturbance, psychotic disturbance, mood disturbance, or anxiety, unspecified dementia type [F03.B0] Patient Active Problem List   Diagnosis Date Noted   Bradycardia 04/14/2022   Cervical myelopathy 02/28/2022   Chronic obstructive pulmonary disease, unspecified COPD type 02/28/2022   Pre-syncope 09/28/2021   AKI (acute kidney injury) 08/24/2021   Acute metabolic encephalopathy 08/24/2021   Anxiety 01/07/2021   Elevated PSA 10/28/2020   Urinary retention 10/25/2020   Abnormal urine odor 04/21/2020   Depression, recurrent 04/21/2020   Cerebrovascular accident (CVA) due to embolism of left middle cerebral artery 12/14/2018   Abnormal findings on diagnostic imaging of lung 08/19/2018   IPF (idiopathic pulmonary fibrosis) 08/19/2018    OSA (obstructive sleep apnea) 08/19/2018   Carotid artery disease 03/26/2017   Internal carotid artery stenosis, left 03/06/2017   Dysphagia 03/06/2017   Pulmonary nodules 03/06/2017   Seizure 03/03/2017   Protein-calorie malnutrition 01/10/2017   Chronic pain 10/02/2016   Chronic insomnia 08/31/2016   Lumbar spinal stenosis 08/18/2016   Wheelchair dependence 08/18/2016   Dementia 08/18/2016   Urinary incontinence 08/18/2016   Polypharmacy 08/18/2016   Laryngopharyngeal reflux (LPR) 08/12/2015   Memory difficulty 08/04/2014   Numbness and tingling of right arm 08/04/2014   Abnormality of gait 08/01/2013   Anemia 12/05/2012   Early satiety 12/03/2012   History of colon polyps 12/03/2012   Facial droop 10/12/2012   Spinal stenosis of cervical region 07/01/2012   GERD 05/27/2009   DYSPHAGIA UNSPECIFIED 05/27/2009   CHANGE IN BOWELS 05/27/2009   SMOKER 09/09/2008   Primary hypertension 09/09/2008   History of stroke 09/09/2008   Constipation 09/09/2008   HIGH BLOOD PRESSURE 07/25/2006   PCP:  Arrie Senate, FNP Pharmacy:   [facility], [place] - [address] [place] [address] Phone:  [phone] Fax: [phone]     Social Determinants of Health (SDOH) Social History: SDOH Screenings   Food Insecurity: No Food Insecurity (08/31/2020)  Housing: Low Risk  (08/31/2020)  Transportation Needs: No Transportation Needs (08/31/2020)  Alcohol Screen: Low Risk  (08/31/2020)  Depression (PHQ2-9): Low Risk  (12/30/2021)  Financial Resource Strain: Low Risk  (08/31/2020)  Physical Activity: Inactive (08/31/2020)  Social Connections: Moderately Isolated (08/31/2020)  Stress: No Stress Concern Present (08/31/2020)  Tobacco Use: High Risk (04/14/2022)   SDOH Interventions:     Readmission Risk Interventions     No data to display

## 2022-04-15 NOTE — Plan of Care (Signed)
  Problem: Acute Rehab PT Goals(only PT should resolve) Goal: Pt Will Go Supine/Side To Sit Outcome: Progressing Flowsheets (Taken 04/15/2022 1245) Pt will go Supine/Side to Sit: with moderate assist Goal: Patient Will Transfer Sit To/From Stand Outcome: Progressing Flowsheets (Taken 04/15/2022 1245) Patient will transfer sit to/from stand: with moderate assist Goal: Pt Will Transfer Bed To Chair/Chair To Bed Outcome: Progressing Flowsheets (Taken 04/15/2022 1245) Pt will Transfer Bed to Chair/Chair to Bed: with mod assist Goal: Pt Will Ambulate Outcome: Progressing Flowsheets (Taken 04/15/2022 1245) Pt will Ambulate:  10 feet  with moderate assist  with rolling walker   12:45 PM, 04/15/22 Ocie Bob, MPT Physical Therapist with [facility] [phone] office [phone] mobile phone

## 2022-04-15 NOTE — Progress Notes (Signed)
Progress Note   Patient: [name] WUJ:[id number] DOB: 1934/07/14 DOA: 04/14/2022     0 DOS: the patient was seen and examined on 04/15/2022   Brief hospital course: Kenyatte Chatmon is a 88 y.o. male with medical history significant of advanced dementia, gastroesophageal flux disease, hypertension, prior history of stroke, COPD, hyperlipidemia and constipation; who presented to the hospital secondary to worsening confusion and concern for acute stroke. At home patient was demonstrating right-sided weakness; he is mainly wheelchair dependent and have baseline left-sided hemiplegia from previous stroke.  Per family he has been struggling with mentation and decreased oral intake for the last 2 weeks or so after he was taken off benzodiazepine therapy.  Patient with recent visit to the emergency department given concern for benzo withdrawal treated with Librium tapering; no signs of withdrawal on examination. CT scan negative for acute abnormality and MRI negative for acute stroke.  Remote infarcts appreciated. There has not been complaints of seizure activity and there is no signs of acute infection. Mild dehydration suspected and while in the ED telemetry was able to capture some abnormal rate and pulses. Patient seen by cardiology and no concerns for further workup or intervention at the moment.  Recommendations given to stop Aricept.    Assessment and Plan: * Bradycardia -No significant abnormalities or concerns as per cardiology evaluation/inputs at time of admission -Telemetry demonstrating sinus rhythm. -Continue to replete electrolytes as needed -TSH within normal limits -Aricept has been discontinued.  Chronic obstructive pulmonary disease, unspecified COPD type -Continue as needed bronchodilator management and the use of Flonase -No acute exacerbation currently appreciated. -Follow clinical status.  Anxiety -Continue the use of Paxil  Cerebrovascular accident (CVA) due to embolism of  left middle cerebral artery -CT scan of the head and MRI negative -Patient confusion status appears to be most likely secondary to further deconditioning and advance of his underlying dementia along with dehydration at time of presentation. -Overall prognosis poor/guarded.   -After discussing with patient's brother he expressed overwhelming situation caring for him at home at this time as he used to live with his sister who passed away 3 weeks ago.   -Despite having assistance from a caregiver Monday through Friday there is still difficult to properly care for him. -There has not been any signs of acute infection; patient remains at high risk for aspiration due to underlying dementia and his diet has been downgraded.  Speech therapy requested. -Follow clinical response.  Seizure -Resume the use of Keppra.  Protein-calorie malnutrition -Body mass index is 17.58 kg/m. -Severe protein calorie malnutrition -Will liberalize diet -Encourage oral intake and use as needed feeding supplements.  Chronic insomnia -Continue as needed trazodone.  Constipation -Continue the use of Amitiza -Maintain adequate hydration -Follow response and follow up response and further add bowel regimen meds as needed.  GERD -Continue PPI.  Primary hypertension -Vital signs stable -Prior to admission no using any antihypertensive agents -Continue monitoring vital signs..  Hypokalemia/hypomagnesemia -Will provide electrolyte repletion and follow trend.  Subjective:  Afebrile, intermittently able to follow simple commands and oriented x 1 only.  Demonstrating inability to safely eat or drink AND A HIGH RISK FOR ASPIRATION.  Physical Exam: Vitals:   04/15/22 0310 04/15/22 0322 04/15/22 0642 04/15/22 1354  BP: (!) 158/78  (!) 163/74 119/60  Pulse: 72 84 64 86  Resp: Temp: (!) 97.5 F (36.4 C)   99.3 F (37.4 C)  TempSrc:    Oral  SpO2: (!) 89%  90% (!) 86% (!) 89%  Weight:      Height:        General exam: Alert, awake, oriented x 1, able to follow very simple commands intermittently; no fever, no chest pain, no nausea or vomiting.  2 L nasal cannula in place. Respiratory system: Positive rhonchi, increased secretions sounds in his upper airways.  No wheezing, no frank crackles. Cardiovascular system:RRR. No rubs or gallops. Gastrointestinal system: Abdomen is nondistended, soft and nontender. No organomegaly or masses felt. Normal bowel sounds heard. Central nervous system: Unable to properly assess secondary to underlying dementia. Extremities: No cyanosis or clubbing. Skin: No petechiae. Psychiatry: Judgement and insight appear impaired secondary to dementia.  Data Reviewed: TSH 0.578 B12 1819 Vitamin D: 40.51 Magnesium: 1.6 Phosphorus: 2.7 Basic metabolic panel: Sodium 137, potassium 3.2, chloride 107, bicarb 24, BUN 9, creatinine 0.72 and GFR more than 60 CBC: WBC 6.5, hemoglobin 13.5 and platelet count 154 K   Family Communication: Brother updated over the phone.  No family members at bedside.  Disposition: Status is: Observation The patient will require care spanning > 2 midnights and should be moved to inpatient because: Received information by brother of overwhelming situation caring for Mr. Reichert at home; asking for him to be placed.  Mr. Omana requiring maximum assistance 2+ at time of physical therapy evaluation; due to his dementia very likely unable to rehabilitate or participate into short-term rehabilitation programs.  Given further progression of his dementia next possible options will include long-term placement/memory unit versus hospice.   Planned Discharge Destination: to be determined; inpatient hospice Vs. Long term SNF  Time spent: 35 minutes  Author: Vassie Loll, MD 04/15/2022 4:47 PM  For on call review [email or web address].

## 2022-04-15 NOTE — NC FL2 (Signed)
[place] MEDICAID FL2 LEVEL OF CARE FORM     IDENTIFICATION  Patient Name: [name] Birthdate: February 28, 1934 Sex: male Admission Date (Current Location): 04/14/2022  [facility] and [place] Number:  [employer] and Address:  [facility],  [address], [place] [address]      Provider Number:    Attending Physician Name and Address:  Vassie Loll, MD  Relative Name and Phone Number:  [name] (Niece) [phone] (Mobile)    Current Level of Care: Hospital Recommended Level of Care: Skilled Nursing Facility Prior Approval Number:    Date Approved/Denied:   PASRR Number: [id number]  Discharge Plan: SNF    Current Diagnoses: Patient Active Problem List   Diagnosis Date Noted   Bradycardia 04/14/2022   Cervical myelopathy 02/28/2022   Chronic obstructive pulmonary disease, unspecified COPD type 02/28/2022   Pre-syncope 09/28/2021   AKI (acute kidney injury) 08/24/2021   Acute metabolic encephalopathy 08/24/2021   Anxiety 01/07/2021   Elevated PSA 10/28/2020   Urinary retention 10/25/2020   Abnormal urine odor 04/21/2020   Depression, recurrent 04/21/2020   Cerebrovascular accident (CVA) due to embolism of left middle cerebral artery 12/14/2018   Abnormal findings on diagnostic imaging of lung 08/19/2018   IPF (idiopathic pulmonary fibrosis) 08/19/2018   OSA (obstructive sleep apnea) 08/19/2018   Carotid artery disease 03/26/2017   Internal carotid artery stenosis, left 03/06/2017   Dysphagia 03/06/2017   Pulmonary nodules 03/06/2017   Seizure 03/03/2017   Protein-calorie malnutrition 01/10/2017   Chronic pain 10/02/2016   Chronic insomnia 08/31/2016   Lumbar spinal stenosis 08/18/2016   Wheelchair dependence 08/18/2016   Dementia 08/18/2016   Urinary incontinence 08/18/2016   Polypharmacy 08/18/2016   Laryngopharyngeal reflux (LPR) 08/12/2015   Memory difficulty 08/04/2014   Numbness and tingling of right arm 08/04/2014    Abnormality of gait 08/01/2013   Anemia 12/05/2012   Early satiety 12/03/2012   History of colon polyps 12/03/2012   Facial droop 10/12/2012   Spinal stenosis of cervical region 07/01/2012   GERD 05/27/2009   DYSPHAGIA UNSPECIFIED 05/27/2009   CHANGE IN BOWELS 05/27/2009   SMOKER 09/09/2008   Primary hypertension 09/09/2008   History of stroke 09/09/2008   Constipation 09/09/2008   HIGH BLOOD PRESSURE 07/25/2006    Orientation RESPIRATION BLADDER Height & Weight     Self, Place  Normal, Other (Comment) External catheter Weight: 119 lb 0.8 oz (54 kg) Height:   (175.3 cm)  BEHAVIORAL SYMPTOMS/MOOD NEUROLOGICAL BOWEL NUTRITION STATUS      Continent    AMBULATORY STATUS COMMUNICATION OF NEEDS Skin   Extensive Assist Verbally (Can get confused) Normal                       Personal Care Assistance Level of Assistance  Bathing, Feeding, Dressing Bathing Assistance: Maximum assistance Feeding assistance: Limited assistance Dressing Assistance: Maximum assistance     Functional Limitations Info  Speech, Sight (Slurred) Sight Info: Impaired   Speech Info: Impaired    SPECIAL CARE FACTORS FREQUENCY  PT (By licensed PT), OT (By licensed OT)     PT Frequency: 5 X a week OT Frequency: 5 X a week            Contractures Contractures Info: Not present    Additional Factors Info  Code Status Code Status Info: DNR             Current Medications (04/15/2022):  This is the current hospital active medication list  Current Facility-Administered Medications  Medication Dose Route Frequency Provider Last Rate Last Admin   0.9 %  sodium chloride infusion  100 mL/hr Intravenous Continuous Jacalyn Lefevre, MD   Held at 04/14/22 1015   acetaminophen (TYLENOL) tablet 650 mg  650 mg Oral Q6H PRN Vassie Loll, MD       Or   acetaminophen (TYLENOL) suppository 650 mg  650 mg Rectal Q6H PRN Vassie Loll, MD       albuterol (PROVENTIL) (2.5 MG/3ML) 0.083% nebulizer  solution 2.5 mg  2.5 mg Nebulization Daily PRN Vassie Loll, MD       aspirin EC tablet 325 mg  325 mg Oral Daily Vassie Loll, MD   325 mg at 04/15/22 0914   atorvastatin (LIPITOR) tablet 40 mg  40 mg Oral Daily Vassie Loll, MD   40 mg at 04/15/22 0914   fluticasone (FLONASE) 50 MCG/ACT nasal spray 2 spray  2 spray Each Nare Daily Vassie Loll, MD   2 spray at 04/15/22 0914   heparin injection 5,000 Units  5,000 Units Subcutaneous Q8H Vassie Loll, MD   5,000 Units at 04/15/22 0546   levETIRAcetam (KEPPRA) tablet 250 mg  250 mg Oral Daily Vassie Loll, MD   250 mg at 04/15/22 0914   lubiprostone (AMITIZA) capsule 24 mcg  24 mcg Oral Daily Vassie Loll, MD   24 mcg at 04/15/22 0914   ondansetron (ZOFRAN) tablet 4 mg  4 mg Oral Q6H PRN Vassie Loll, MD       Or   ondansetron [facility]) injection 4 mg  4 mg Intravenous Q6H PRN Vassie Loll, MD       pantoprazole (PROTONIX) EC tablet 40 mg  40 mg Oral Daily Vassie Loll, MD   40 mg at 04/15/22 0914   PARoxetine (PAXIL) tablet 40 mg  40 mg Oral Daily Vassie Loll, MD   40 mg at 04/15/22 0914   traZODone (DESYREL) tablet 100 mg  100 mg Oral QHS PRN Vassie Loll, MD         Discharge Medications: Please see discharge summary for a list of discharge medications.  Relevant Imaging Results:  Relevant Lab Results:   Additional Information    Arvilla Salada I Ninetta Lights, LCSW

## 2022-04-15 NOTE — Progress Notes (Signed)
Patient had mostly uneventful night last night, O2 sats dropped to 86% 2L via N/c returned sats to 92%, patient had trouble clearing secretions and Y[place]auer suction was set up bedside and patient was assisted with deep breathing and coughing but needed to be suctioned x2 returning thick clear secretions.

## 2022-04-16 DIAGNOSIS — Z993 Dependence on wheelchair: Secondary | ICD-10-CM | POA: Diagnosis not present

## 2022-04-16 DIAGNOSIS — I10 Essential (primary) hypertension: Secondary | ICD-10-CM | POA: Diagnosis not present

## 2022-04-16 DIAGNOSIS — Z882 Allergy status to sulfonamides status: Secondary | ICD-10-CM | POA: Diagnosis not present

## 2022-04-16 DIAGNOSIS — Z515 Encounter for palliative care: Secondary | ICD-10-CM | POA: Diagnosis not present

## 2022-04-16 DIAGNOSIS — I69354 Hemiplegia and hemiparesis following cerebral infarction affecting left non-dominant side: Secondary | ICD-10-CM | POA: Diagnosis not present

## 2022-04-16 DIAGNOSIS — E876 Hypokalemia: Secondary | ICD-10-CM | POA: Diagnosis present

## 2022-04-16 DIAGNOSIS — Z634 Disappearance and death of family member: Secondary | ICD-10-CM | POA: Diagnosis not present

## 2022-04-16 DIAGNOSIS — K5900 Constipation, unspecified: Secondary | ICD-10-CM | POA: Diagnosis present

## 2022-04-16 DIAGNOSIS — J449 Chronic obstructive pulmonary disease, unspecified: Secondary | ICD-10-CM | POA: Diagnosis not present

## 2022-04-16 DIAGNOSIS — I2510 Atherosclerotic heart disease of native coronary artery without angina pectoris: Secondary | ICD-10-CM | POA: Diagnosis present

## 2022-04-16 DIAGNOSIS — E860 Dehydration: Secondary | ICD-10-CM | POA: Diagnosis not present

## 2022-04-16 DIAGNOSIS — Z7189 Other specified counseling: Secondary | ICD-10-CM | POA: Diagnosis not present

## 2022-04-16 DIAGNOSIS — I63412 Cerebral infarction due to embolism of left middle cerebral artery: Secondary | ICD-10-CM | POA: Diagnosis not present

## 2022-04-16 DIAGNOSIS — R001 Bradycardia, unspecified: Secondary | ICD-10-CM | POA: Diagnosis not present

## 2022-04-16 DIAGNOSIS — Z681 Body mass index (BMI) 19 or less, adult: Secondary | ICD-10-CM | POA: Diagnosis not present

## 2022-04-16 DIAGNOSIS — E785 Hyperlipidemia, unspecified: Secondary | ICD-10-CM | POA: Diagnosis present

## 2022-04-16 DIAGNOSIS — K219 Gastro-esophageal reflux disease without esophagitis: Secondary | ICD-10-CM | POA: Diagnosis present

## 2022-04-16 DIAGNOSIS — F5104 Psychophysiologic insomnia: Secondary | ICD-10-CM | POA: Diagnosis present

## 2022-04-16 DIAGNOSIS — Z1152 Encounter for screening for COVID-19: Secondary | ICD-10-CM | POA: Diagnosis not present

## 2022-04-16 DIAGNOSIS — F17210 Nicotine dependence, cigarettes, uncomplicated: Secondary | ICD-10-CM | POA: Diagnosis present

## 2022-04-16 DIAGNOSIS — E43 Unspecified severe protein-calorie malnutrition: Secondary | ICD-10-CM | POA: Diagnosis not present

## 2022-04-16 DIAGNOSIS — F03B4 Unspecified dementia, moderate, with anxiety: Secondary | ICD-10-CM | POA: Diagnosis present

## 2022-04-16 DIAGNOSIS — F03B18 Unspecified dementia, moderate, with other behavioral disturbance: Secondary | ICD-10-CM | POA: Diagnosis present

## 2022-04-16 DIAGNOSIS — K2100 Gastro-esophageal reflux disease with esophagitis, without bleeding: Secondary | ICD-10-CM | POA: Diagnosis not present

## 2022-04-16 DIAGNOSIS — R569 Unspecified convulsions: Secondary | ICD-10-CM | POA: Diagnosis not present

## 2022-04-16 DIAGNOSIS — Z66 Do not resuscitate: Secondary | ICD-10-CM | POA: Diagnosis present

## 2022-04-16 DIAGNOSIS — F03B0 Unspecified dementia, moderate, without behavioral disturbance, psychotic disturbance, mood disturbance, and anxiety: Secondary | ICD-10-CM | POA: Diagnosis not present

## 2022-04-16 LAB — BASIC METABOLIC PANEL
Anion gap: 12 (ref 5–15)
BUN: 15 mg/dL (ref 8–23)
CO2: 23 mmol/L (ref 22–32)
Calcium: 8.5 mg/dL — ABNORMAL LOW (ref 8.9–10.3)
Chloride: 105 mmol/L (ref 98–111)
Creatinine, Ser: 0.82 mg/dL (ref 0.61–1.24)
GFR, Estimated: >60 mL/min (ref >60)
Glucose, Bld: 95 mg/dL (ref 70–99)
Potassium: 3.6 mmol/L (ref 3.5–5.1)
Sodium: 140 mmol/L (ref 135–145)

## 2022-04-16 LAB — MAGNESIUM: Magnesium: 2 mg/dL (ref 1.7–2.4)

## 2022-04-16 MED ORDER — SCOPOLAMINE 1 MG/3DAYS TD PT72
1.0000 | MEDICATED_PATCH | TRANSDERMAL | Status: DC
  Administered 2022-04-16: 1.5 mg via TRANSDERMAL
  Filled 2022-04-16: qty 1

## 2022-04-16 NOTE — Evaluation (Addendum)
Clinical/Bedside Swallow Evaluation Patient Details  Name: [name] MRN: [id number] Date of Birth: 25-Mar-1934  Today's Date: 04/16/2022 Time: SLP Start Time (ACUTE ONLY): 0901 SLP Stop Time (ACUTE ONLY): 0925 SLP Time Calculation (min) (ACUTE ONLY): 24 min  Past Medical History:  Past Medical History:  Diagnosis Date   Abnormality of gait 08/01/2013   Arthritis    Blindness of left eye    Posttraumatic   Carotid artery disease    Colon polyp    Constipation    Diaphragmatic hernia    Disc disease, degenerative, cervical    Diverticulitis    ED (erectile dysfunction)    Gastroparesis    GERD (gastroesophageal reflux disease)    Glaucoma    Heart disease    Hyperlipidemia    Hypertension    Insomnia    Memory difficulty 08/04/2014   Nicotine dependence    Numbness and tingling of right arm 08/04/2014   Osteoarthritis    Overactive bladder    Radiculopathy    Shortness of breath    with activity   Sleep apnea    does not use CPAP.  Tested maybe 5- 10 years ago   Stroke    Left side weakness.   Unilateral inguinal hernia    Past Surgical History:  Past Surgical History:  Procedure Laterality Date   ANTERIOR CERVICAL DECOMP/DISCECTOMY FUSION N/A 07/01/2012   Procedure: ANTERIOR CERVICAL DECOMPRESSION/DISCECTOMY FUSION CERVICAL FIVE-SIX [name] REMOVAL CERVICAL SIX-SEVEN;  Surgeon: Temple Pacini, MD;  Location: [facility];  Service: Neurosurgery;  Laterality: N/A;   CERVICAL FUSION     COLONOSCOPY   08/04/2002     RMR: Normal rectum/Pancolonic diverticula/Colonic polyps as described above, biopsied and/or snared   COLONOSCOPY  60/08/2009   RMR: normal rectum/left and right sided diverticula/multiple colonic polyps. tubular adenomas. surveillance due 2014   COLONOSCOPY WITH ESOPHAGOGASTRODUODENOSCOPY (EGD) N/A 12/18/2012   Dr. Jena Gauss:  Colonic diverticulosis. Multiple colonic polyps-hyperplastic polyps and tubular adenoma. Friable anal canal hemorrhoids. EGD with chronic  atrophic gastritis   ESOPHAGOGASTRODUODENOSCOPY  04/14/2002   OFB:PZWCHENI'D' ring, status post dilation as described above/Hiatal hernia, focal antral erosions of uncertain clinical significance   ESOPHAGOGASTRODUODENOSCOPY  06/09/2009   RMR: normal esophagus s/p dilator/small hiatal hernia otherwise normal   HERNIA REPAIR Right    inguinal   I & D EXTREMITY Right 04/23/2017   Procedure: IRRIGATION AND DEBRIDEMENT HAND;  Surgeon: Sheral Apley, MD;  Location: [facility] OR;  Service: Orthopedics;  Laterality: Right;   INGUINAL HERNIA REPAIR Left 04/15/2014   Procedure: LEFT INGUINAL HERNIORRHAPHY WITH MESH;  Surgeon: Franky Macho Md, MD;  Location: [facility];  Service: General;  Laterality: Left;   INSERTION OF MESH Left 04/15/2014   Procedure: INSERTION OF MESH;  Surgeon: Franky Macho Md, MD;  Location: [facility];  Service: General;  Laterality: Left;   HPI:  [name] is a 88 y.o. male with medical history significant of advanced dementia, gastroesophageal flux disease, hypertension, prior history of stroke, COPD, hyperlipidemia and constipation; who presented to the hospital secondary to worsening confusion and concern for acute stroke.  At home patient was demonstrating right-sided weakness; he is mainly wheelchair dependent and have baseline left-sided hemiplegia from previous stroke.  Per family he has been struggling with mentation and decreased oral intake for the last 2 weeks or so after he was taken off benzodiazepine therapy.  Patient with recent visit to the emergency department given concern for benzo withdrawal treated with Librium tapering; no signs of withdrawal on  examination.  CT scan negative for acute abnormality and MRI negative for acute stroke.  Remote infarcts appreciated.  There has not been complaints of seizure activity and there is no signs of acute infection.  Mild dehydration suspected and while in the ED telemetry was able to capture some abnormal rate and pulses.  Patient seen by  cardiology and no concerns for further workup or intervention at the moment. Chest xray:Chronic fibrotic interstitial lung disease. Mildly increased right  upper lobe opacity could reflect differences in technique versus  superimposed infection.   Recommendations given to stop Aricept. Pt had MBSS 03/05/2017 with recommendation for D3/thin (no nuts, seeds due to pharyngocele). BSE requested.   Clinical Impression from MBSS 2019            << Pt presents with mild oropharyngeal phase dysphagia characterized by decreased oral bolus cohesiveness with piecemeal deglutition, min premature spillage and delay in swallow initiation with swallow trigger after filling valleculae and spilling to pyriforms with straw sips of thin. The above results in min lingual residue which trickles to valleculae post swallow and is generally cleared with a spontaneous repeat/dry swallow. 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 Pt with min pyriform residue post swallow with puree and solids near the entrance to UES and also at the level of previous ACDF (~C5/6). Pt with trace, flash penetration of thins when taking sequential straw sips, other wise no penetration or aspiration observed during the study.                Pt with a subjective c/o coughing when eating nuts and chips at home. When Pt was given the barium tablet, he swallowed it without liquid and it became delayed in the pyriforms and did not pass through UES until he followed with liquid wash. Suspect that this may also occur with nut pieces. Esophageal sweep was unremarkable, pill traversed to stomach after liquid wash. Pt was assessed with nectar thick liquids merely to ascertain which side of pharynx showed greater retention of bolus and it was shown to be the right side. After reviewing the study with Pt and his wife, the patient shared that he  frequently eats peanuts throughout the day. Given the retention of pill in pyriforms during this study and Pt's subjective report of "getting choked" on nuts and chips in setting of PNA, recommend D3/mech soft with thin liquids with use of aspiration precautions reviewed with Pt/family. Pt can resume a self regulated "regular texture" diet once at home with avoidance of nuts, cornbread, chips, and seeds. It was suggested that he try eating creamy peanut butter to replace the peanuts. Pt and wife in agreement. Pt to sit fully upright for all eating/drinking and remain upright for at least 30 minutes following, take small bites/sips, and swallow 2-3 times for each bite/sip. No further SLP services indicated at this time. >>   Assessment / Plan / Recommendation  Clinical Impression  Clinical swallow evaluation completed at bedside in a limited capacity due to Pt lethargy with variable alertness. Pt with audible secretions and thick oral secretions. Oral care completed with suction. Pt attempts to follow commands for coughing and swallowing with intermittent success. Pt accepted a couple of ice chips with reduced lingual movement and delay in swallow trigger. Frequent suctioning completed and no food trials given (only tried ice chips and thin water)  and Pt with signs of reduced airway protection. He is able to produce a weak cough. He reports that his spouse passed away and has one son. He denies being hungry, but does state he is thirsty. Recommend NPO including medications and offer ice chips prn after oral care. SLP discussed with RN and MD and will follow during acute stay. Pt had MBSS in 2019 (see results within this report). SLP Visit Diagnosis: Dysphagia, unspecified (R13.10)    Aspiration Risk  Moderate aspiration risk;Risk for inadequate nutrition/hydration;Severe aspiration risk    Diet Recommendation NPO;Ice chips PRN after oral care   Medication Administration: Via alternative means    Other   Recommendations Oral Care Recommendations: Oral care prior to ice chip/H20;Staff/trained caregiver to provide oral care Caregiver Recommendations: Have oral suction available    Recommendations for follow up therapy are one component of a multi-disciplinary discharge planning process, led by the attending physician.  Recommendations may be updated based on patient status, additional functional criteria and insurance authorization.  Follow up Recommendations Skilled nursing-short term rehab (<3 hours/day)      Assistance Recommended at Discharge    Functional Status Assessment Patient has had a recent decline in their functional status and demonstrates the ability to make significant improvements in function in a reasonable and predictable amount of time.  Frequency and Duration min 2x/week  1 week       Prognosis Prognosis for improved oropharyngeal function: Guarded Barriers to Reach Goals: Severity of deficits      Swallow Study   General Date of Onset: 04/14/22 HPI: [name] is a 88 y.o. male with medical history significant of advanced dementia, gastroesophageal flux disease, hypertension, prior history of stroke, COPD, hyperlipidemia and constipation; who presented to the hospital secondary to worsening confusion and concern for acute stroke.  At home patient was demonstrating right-sided weakness; he is mainly wheelchair dependent and have baseline left-sided hemiplegia from previous stroke.  Per family he has been struggling with mentation and decreased oral intake for the last 2 weeks or so after he was taken off benzodiazepine therapy.  Patient with recent visit to the emergency department given concern for benzo withdrawal treated with Librium tapering; no signs of withdrawal on examination.  CT scan negative for acute abnormality and MRI negative for acute stroke.  Remote infarcts appreciated.  There has not been complaints of seizure activity and there is no signs of acute  infection.  Mild dehydration suspected and while in the ED telemetry was able to capture some abnormal rate and pulses.  Patient seen by cardiology and no concerns for further workup or intervention at the moment. Chest xray:Chronic fibrotic interstitial lung disease. Mildly increased right  upper lobe opacity could reflect differences in technique versus  superimposed infection.   Recommendations given to stop Aricept. Pt had MBSS 03/05/2017 with recommendation for D3/thin (no nuts, seeds due to pharyngocele). BSE requested. Type of Study: Bedside Swallow Evaluation Previous Swallow Assessment: 03/05/17 MBSS D3/thin, pharyngocele Diet Prior to this Study: Dysphagia 2 (finely chopped);Thin liquids (Level 0) Temperature Spikes Noted: No Respiratory Status: Nasal cannula History of Recent Intubation: No Behavior/Cognition: Cooperative;Pleasant mood;Lethargic/Drowsy;Requires cueing Oral Cavity Assessment: Excessive secretions Oral Care Completed by SLP: Yes Oral Cavity - Dentition: Poor condition;Missing dentition Vision: Impaired for self-feeding Self-Feeding Abilities: Total assist Patient Positioning: Upright in bed Baseline Vocal Quality: Wet Volitional Cough: Congested;Weak Volitional Swallow: Able to elicit    Oral/Motor/Sensory Function Overall Oral Motor/Sensory Function: Generalized oral weakness   Ice Chips Ice chips:  Impaired Presentation: Spoon Oral Phase Impairments: Reduced labial seal;Reduced lingual movement/coordination Oral Phase Functional Implications: Prolonged oral transit Pharyngeal Phase Impairments: Suspected delayed Swallow   Thin Liquid Thin Liquid: Impaired Presentation: Spoon;Straw Oral Phase Impairments: Reduced labial seal Pharyngeal  Phase Impairments: Unable to trigger swallow    Nectar Thick Nectar Thick Liquid: Not tested   Honey Thick Honey Thick Liquid: Not tested   Puree Puree: Not tested   Solid     Solid: Not tested     Thank you,  Havery Moros, CCC-SLP [phone]  [name] 04/16/2022,10:18 AM

## 2022-04-16 NOTE — Progress Notes (Signed)
Pt is coherant but could barley stay awake. Crushed pills in applesauce except two and he held in his mouth. I had to end up suctioning. Patient has a lot of secretion built up and he needs to be suctioned often. MD put in order for scopolamine patch. Patch applied behind right ear.

## 2022-04-16 NOTE — Progress Notes (Signed)
Progress Note   Patient: [name] ZOX:[id number] DOB: 14-Jan-1934 DOA: 04/14/2022     0 DOS: the patient was seen and examined on 04/16/2022   Brief hospital course: [name] is a 88 y.o. male with medical history significant of advanced dementia, gastroesophageal flux disease, hypertension, prior history of stroke, COPD, hyperlipidemia and constipation; who presented to the hospital secondary to worsening confusion and concern for acute stroke. At home patient was demonstrating right-sided weakness; he is mainly wheelchair dependent and have baseline left-sided hemiplegia from previous stroke.  Per family he has been struggling with mentation and decreased oral intake for the last 2 weeks or so after he was taken off benzodiazepine therapy.  Patient with recent visit to the emergency department given concern for benzo withdrawal treated with Librium tapering; no signs of withdrawal on examination. CT scan negative for acute abnormality and MRI negative for acute stroke.  Remote infarcts appreciated. There has not been complaints of seizure activity and there is no signs of acute infection. Mild dehydration suspected and while in the ED telemetry was able to capture some abnormal rate and pulses. Patient seen by cardiology and no concerns for further workup or intervention at the moment.  Recommendations given to stop Aricept.  Patient actively declining from further progression of underlying dementia; at high risk for aspiration and with very poor/guarded prognosis.  Palliative care has been consulted for assistant determining candidacy for inpatient hospice and symptomatic management.  TOC has been made aware.  Assessment and Plan: * Bradycardia -No significant abnormalities or concerns as per cardiology evaluation/inputs at time of admission -Telemetry demonstrating sinus rhythm. -Continue to replete electrolytes as needed -TSH within normal limits -Aricept has been discontinued.  Chronic  obstructive pulmonary disease, unspecified COPD type -Continue as needed bronchodilator management and the use of Flonase -No acute exacerbation currently appreciated. -Follow clinical status.  Anxiety -Continue the use of Paxil  Cerebrovascular accident (CVA) due to embolism of left middle cerebral artery -CT scan of the head and MRI negative -Patient confusion status appears to be most likely secondary to further deconditioning and advance of his underlying dementia along with dehydration at time of presentation. -Overall prognosis poor/guarded.   -After discussing with patient's brother he expressed overwhelming situation caring for him at home at this time as he used to live with his sister who passed away 3 weeks ago.   -Despite having assistance from a caregiver Monday through Friday there is still difficult to properly care for him. -There has not been any signs of acute infection; patient remains at high risk for aspiration due to underlying dementia and his diet has been downgraded.  Speech therapy requested. -Follow clinical response.  Seizure -Resume the use of Keppra.  Protein-calorie malnutrition -Body mass index is 17.58 kg/m. -Severe protein calorie malnutrition -Will liberalize diet -Encourage oral intake and use as needed feeding supplements.  Chronic insomnia -Continue as needed trazodone.  Constipation -Continue the use of Amitiza -Maintain adequate hydration -Follow response and follow up response and further add bowel regimen meds as needed.  GERD -Continue PPI.  Primary hypertension -Vital signs stable -Prior to admission no using any antihypertensive agents -Continue monitoring vital signs..  Hypokalemia/hypomagnesemia -Will continue to provide electrolyte repletion and follow trend.  Subjective:  Sleepy/somnolent at time of evaluation; intermittently oriented x 1.  Demonstrating increased upper secretions, unable to safely swallow dysphagia to  diet at the moment.  No fever, no chest pain, no nausea or vomiting reported.  Physical Exam: Vitals:  04/15/22 0642 04/15/22 1354 04/16/22 0411 04/16/22 1430  BP: (!) 163/74 119/60 (!) 176/62 (!) 160/64  Pulse: 64 86 65 65  Resp: 16 18 18 16   Temp:  99.3 F (37.4 C) 98.7 F (37.1 C) (!) 97.5 F (36.4 C)  TempSrc:  Oral Axillary Oral  SpO2: (!) 86% (!) 89% 93% 94%  Weight:      Height:       General exam: Sleepy/somnolent intermittently able to follow simple commands of being oriented x 1.  No chest pain, increased upper airway secretions or gargling cough. Respiratory system: No wheezing, no crackles, no using accessory muscle.  Good saturation on room air. Cardiovascular system: No rubs, no gallops. Gastrointestinal system: Abdomen is nondistended, soft and with positive bowel sounds.  No guarding appreciated. Central nervous system: Moving limbs advantageously; no new focal deficit.  Limited examination secondary to underlying mentation. Extremities: No cyanosis, clubbing or edema. Skin: No petechiae. Psychiatry: Judgement and insight appear impaired secondary to underlying dementia.  Data Reviewed: TSH 0.578 B12 1819 Vitamin D: 40.51 Magnesium: 1.6 Phosphorus: 2.7 Basic metabolic panel: Sodium 137, potassium 3.2, chloride 107, bicarb 24, BUN 9, creatinine 0.72 and GFR more than 60 CBC: WBC 6.5, hemoglobin 13.5 and platelet count 154 K   Family Communication: Brother updated over the phone.  No family members at bedside.  Disposition: Status is: Observation The patient will require care spanning > 2 midnights and should be moved to inpatient because: Received information by brother of overwhelming situation caring for [name] at home; asking for him to be placed.  [name] requiring maximum assistance 2+ at time of physical therapy evaluation; due to his dementia very likely unable to rehabilitate or participate into short-term rehabilitation programs.  Given further  progression of his dementia next possible options will include long-term placement/memory unit versus hospice.   Planned Discharge Destination: to be determined; inpatient hospice Vs. Long term SNF  Time spent: 35 minutes  Author: Vassie Loll, MD 04/16/2022 3:28 PM  For on call review [email or web address].

## 2022-04-16 NOTE — TOC Progression Note (Addendum)
Transition of Care [facility]) - Progression Note    Patient Details  Name: [name] MRN: [id number] Date of Birth: 02/04/1934  [facility] CM/SW Contact  Benjamin Gravel, LCSW Phone Number: 04/16/2022, 10:56 AM  Clinical Narrative:    MD states that pt is showing some decline. MD reports that he spoke to brother yesterday regarding Hospice vs SNF to be determined by pt progress. Hospice referral may be requested. SNF Auth has been started, 1 bed offer at [facility] is available.  DC 1-2 days. TOC to follow.  Addendum: CSW contacted niece who report she is guardian updated and advised waiting bed offers and determine further recommendations.   Expected Discharge Plan: Skilled Nursing Facility Barriers to Discharge: Continued Medical Work up  Expected Discharge Plan and Services In-house Referral: Clinical Social Work   Post Acute Care Choice: Skilled Nursing Facility Living arrangements for the past 2 months: Single Family Home                                       Social Determinants of Health (SDOH) Interventions SDOH Screenings   Food Insecurity: No Food Insecurity (08/31/2020)  Housing: Low Risk  (08/31/2020)  Transportation Needs: No Transportation Needs (08/31/2020)  Alcohol Screen: Low Risk  (08/31/2020)  Depression (PHQ2-9): Low Risk  (12/30/2021)  Financial Resource Strain: Low Risk  (08/31/2020)  Physical Activity: Inactive (08/31/2020)  Social Connections: Moderately Isolated (08/31/2020)  Stress: No Stress Concern Present (08/31/2020)  Tobacco Use: High Risk (04/14/2022)    Readmission Risk Interventions     No data to display

## 2022-04-17 ENCOUNTER — Encounter (HOSPITAL_COMMUNITY): Payer: Self-pay | Admitting: Internal Medicine

## 2022-04-17 DIAGNOSIS — E860 Dehydration: Secondary | ICD-10-CM | POA: Diagnosis not present

## 2022-04-17 DIAGNOSIS — Z7189 Other specified counseling: Secondary | ICD-10-CM

## 2022-04-17 DIAGNOSIS — R001 Bradycardia, unspecified: Secondary | ICD-10-CM | POA: Diagnosis not present

## 2022-04-17 DIAGNOSIS — Z515 Encounter for palliative care: Secondary | ICD-10-CM

## 2022-04-17 DIAGNOSIS — K2100 Gastro-esophageal reflux disease with esophagitis, without bleeding: Secondary | ICD-10-CM | POA: Diagnosis not present

## 2022-04-17 DIAGNOSIS — E43 Unspecified severe protein-calorie malnutrition: Secondary | ICD-10-CM | POA: Diagnosis not present

## 2022-04-17 LAB — MRSA NEXT GEN BY PCR, NASAL: MRSA by PCR Next Gen: DETECTED — AB

## 2022-04-17 NOTE — Progress Notes (Signed)
SLP Cancellation Note  Patient Details Name: Josian Moreno MRN: [id number] DOB: 13-Nov-1934   Cancelled treatment:       Reason Eval/Treat Not Completed: Fatigue/lethargy limiting ability to participate; Acknowledge likely transfer to [facility], consider liberalizing PO if Pt requests, otherwise continue oral care and ice chips.  Thank you,  Havery Moros, CCC-SLP [phone]   Camey Edell 04/17/2022, 2:35 PM

## 2022-04-17 NOTE — Progress Notes (Signed)
Progress Note   Patient: [name] MHD:[id number] DOB: 04-03-1934 DOA: 04/14/2022     1 DOS: the patient was seen and examined on 04/17/2022   Brief hospital course: Lelan Janeczek is a 88 y.o. male with medical history significant of advanced dementia, gastroesophageal flux disease, hypertension, prior history of stroke, COPD, hyperlipidemia and constipation; who presented to the hospital secondary to worsening confusion and concern for acute stroke. At home patient was demonstrating right-sided weakness; he is mainly wheelchair dependent and have baseline left-sided hemiplegia from previous stroke.  Per family he has been struggling with mentation and decreased oral intake for the last 2 weeks or so after he was taken off benzodiazepine therapy.  Patient with recent visit to the emergency department given concern for benzo withdrawal treated with Librium tapering; no signs of withdrawal on examination. CT scan negative for acute abnormality and MRI negative for acute stroke.  Remote infarcts appreciated. There has not been complaints of seizure activity and there is no signs of acute infection. Mild dehydration suspected and while in the ED telemetry was able to capture some abnormal rate and pulses. Patient seen by cardiology and no concerns for further workup or intervention at the moment.  Recommendations given to stop Aricept.  Patient actively declining from further progression of underlying dementia; at high risk for aspiration and with very poor/guarded prognosis.  Palliative care has been consulted for assistant determining candidacy for inpatient hospice and symptomatic management.  TOC has been made aware.  Assessment and Plan: * Bradycardia -No significant abnormalities or concerns as per cardiology evaluation/inputs at time of admission -Telemetry demonstrating sinus rhythm. -Continue to replete electrolytes as needed -TSH within normal limits -Aricept has been discontinued.  Chronic  obstructive pulmonary disease, unspecified COPD type -Continue as needed bronchodilator management and the use of Flonase -No acute exacerbation currently appreciated. -Follow clinical status.  Anxiety -Continue the use of Paxil  Cerebrovascular accident (CVA) due to embolism of left middle cerebral artery -CT scan of the head and MRI negative -Patient confusion status appears to be most likely secondary to further deconditioning and advance of his underlying dementia along with dehydration at time of presentation. -Overall prognosis poor/guarded.   -After discussing with patient's brother he expressed overwhelming situation caring for him at home at this time as he used to live with his sister who passed away 3 weeks ago.   -Despite having assistance from a caregiver Monday through Friday there is still difficult to properly care for him. -There has not been any signs of acute infection; patient remains at high risk for aspiration due to underlying dementia and his diet has been downgraded.  Speech therapy requested. -Follow clinical response.  Seizure -Resume the use of Keppra.  Protein-calorie malnutrition -Body mass index is 17.58 kg/m. -Severe protein calorie malnutrition -Will liberalize diet -Encourage oral intake and use as needed feeding supplements.  Chronic insomnia -Continue as needed trazodone.  Constipation -Continue the use of Amitiza -Maintain adequate hydration -Follow response and follow up response and further add bowel regimen meds as needed.  GERD -Continue PPI.  Primary hypertension -Vital signs stable -Prior to admission no using any antihypertensive agents -Continue monitoring vital signs..  Hypokalemia/hypomagnesemia -Will continue to provide electrolyte repletion and follow trend.  Subjective:  Sleepy at time of evaluation but able to follow simple commands intermittently.  Expressing no pain.  Chronically ill and underweight on exam.   Afebrile.  Increase secretion sounds in his upper airways appreciated on examination.  Physical Exam: Vitals:  04/16/22 0411 04/16/22 1430 04/16/22 2147 04/17/22 0514  BP: (!) 176/62 (!) 160/64 (!) 152/70 (!) 155/64  Pulse: 65 65 60 67  Resp: Temp: 98.7 F (37.1 C) (!) 97.5 F (36.4 C)    TempSrc: Axillary Oral    SpO2: 93% 94%  94%  Weight:      Height:       General exam: Sleepy, no eating, not drinking, expressing no pain.  No fever. Respiratory system: Increased upper secretions sounds appreciated; No using accessory muscles. Cardiovascular system: Rate controlled, no rubs, no gallops. Gastrointestinal system: Abdomen is nondistended, soft and nontender. No organomegaly or masses felt. Normal bowel sounds heard. Central nervous system: Limited examination with current mentation. Extremities: No cyanosis or clubbing. Skin: No petechiae. Psychiatry: Judgement and insight appear impaired secondary to underlying dementia.  Data Reviewed: TSH 0.578 B12 1819 Vitamin D: 40.51 Magnesium: 1.6 Phosphorus: 2.7 Basic metabolic panel: Sodium 137, potassium 3.2, chloride 107, bicarb 24, BUN 9, creatinine 0.72 and GFR more than 60 CBC: WBC 6.5, hemoglobin 13.5 and platelet count 154 K  No further new labs ordered.  Family Communication: No family at bedside.  Disposition: Status is: Observation The patient will require care spanning > 2 midnights and should be moved to inpatient because: Received information by brother of overwhelming situation caring for Mr. Maden at home; asking for him to be placed.  Mr. Munyon requiring maximum assistance 2+ at time of physical therapy evaluation; due to his dementia very likely unable to rehabilitate or participate into short-term rehabilitation programs.  Given further progression of his dementia next possible options will include long-term placement/memory unit versus hospice.   Planned Discharge Destination: to be determined;  inpatient hospice Vs. Long term SNF  Time spent: 35 minutes  Author: Vassie Loll, MD 04/17/2022 5:15 PM  For on call review [email or web address].

## 2022-04-17 NOTE — Consult Note (Signed)
Consultation Note Date: 04/17/2022   Patient Name: [name]  DOB: 12-24-1934  MRN: [id number]  Age / Sex: 88 y.o., male  PCP: Arrie Senate, FNP Referring Physician: Vassie Loll, MD  Reason for Consultation: Establishing goals of care  HPI/Patient Profile: 88 y.o. male  with past medical history of advanced dementia, GERD, HTN/HLD, COPD, prior history of a stroke, OA, cervical disc disease, diverticulitis, sleep apnea no CPAP, hernia diaphragm and inguinal, admitted on 04/14/2022 with bradycardia, COPD.   Clinical Assessment and Goals of Care: I have reviewed medical records including EPIC notes, labs and imaging, received report from RN, assessed the patient.  [name] is lying quietly in bed.  He appears acutely/chronically ill and very frail.  He is resting comfortably.  He has known dementia.  I am not sure that he can make his basic needs known.  There is no family at bedside at this time.  He does deny pain. Call to brother, [name], left somewhat generic voicemail message as this seems to be the answering machine for an alteration shock.  Call to niece, [name], to discuss diagnosis prognosis, GOC, EOL wishes, disposition and options.  I introduced Palliative Medicine as specialized medical care for people living with serious illness. It focuses on providing relief from the symptoms and stress of a serious illness. The goal is to improve quality of life for both the patient and the family.  We discussed a brief life review of the patient. Sister died 3 weeks ago, Benjamin tells me thar she is now his guardian. Wife died 3 years ago, they live near one another on grandfathers native [place] land. Denies memory loss. 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 They did things together, such as banking and bill pay.   We then focused on their current illness. Shares that "terminal  secretions", not eating, unable to swallow, crushed meds in applesauce.  The natural disease trajectory and expectations at EOL were discussed.  The difference between aggressive medical intervention and comfort care was considered in light of the patient's goals of care.   Advanced directives, concepts specific to code status, artifical feeding and hydration, and rehospitalization were considered and discussed.  DNR verified.  Niece shares that the patient's lived them and making choices for her mother.  She heard his voice about how he wanted to be cared for also.  Hospice Care services outpatient were explained and offered.  Niece shares that she would like comfort and dignity for [name], she is agreeable to residential hospice.  Provider choice offered.  They request [facility].  She tells me that [name]'s brother is at [employer] house at this time with prostate cancer.  Her mother, [name] sister died about 3 weeks ago.  Discussed the importance of continued conversation with family and the medical providers regarding overall plan of care and treatment options, ensuring decisions are within the context of the patient's values and GOCs.  Questions and concerns were addressed.  Hard Choices booklet left for  review. The family was encouraged to call with questions or concerns.  PMT will continue to support holistically.  Conference with attending, bedside nursing staff, transition of care team related to patient condition, needs, goals of care, disposition.   HCPOA HCPOA -niece, [name].    SUMMARY OF RECOMMENDATIONS   Requesting comfort and dignity at end-of-life, residential hospice at [facility].    Code Status/Advance Care Planning: DNR  Symptom Management:  Per hospitalist, no additional needs at this time.  Palliative Prophylaxis:  Frequent Pain Assessment, Oral Care, and Turn Reposition  Additional Recommendations (Limitations, Scope, Preferences): Full Comfort  Care  Psycho-social/Spiritual:  Desire for further Chaplaincy support:no Additional Recommendations: Caregiving  Support/Resources and Education on Hospice  Prognosis:  < 2 weeks anticipated based on advanced dementia, deconditioning and dehydration, n.p.o. status,  Discharge Planning: Hospice facility      Primary Diagnoses: Present on Admission:  Bradycardia  Primary hypertension  GERD  Constipation  Chronic insomnia  Protein-calorie malnutrition  Cerebrovascular accident (CVA) due to embolism of left middle cerebral artery  Anxiety  Chronic obstructive pulmonary disease, unspecified COPD type   I have reviewed the medical record, interviewed the patient and family, and examined the patient. The following aspects are pertinent.  Past Medical History:  Diagnosis Date   Abnormality of gait 08/01/2013   Arthritis    Blindness of left eye    Posttraumatic   Carotid artery disease    Colon polyp    Constipation    Diaphragmatic hernia    Disc disease, degenerative, cervical    Diverticulitis    ED (erectile dysfunction)    Gastroparesis    GERD (gastroesophageal reflux disease)    Glaucoma    Heart disease    Hyperlipidemia    Hypertension    Insomnia    Memory difficulty 08/04/2014   Nicotine dependence    Numbness and tingling of right arm 08/04/2014   Osteoarthritis    Overactive bladder    Radiculopathy    Shortness of breath    with activity   Sleep apnea    does not use CPAP.  Tested maybe 5- 10 years ago   Stroke    Left side weakness.   Unilateral inguinal hernia    Social History   Socioeconomic History   Marital status: Widowed    Spouse name: Not on file   Number of children: 1   Years of education: hs   Highest education level: Not on file  Occupational History   Occupation: Retired    Associate Professor: RETIRED  Tobacco Use   Smoking status: Every Day    Packs/day: 0.25    Years: 60.00    Additional pack years: 0.00    Total pack years: 15.00     Types: Cigarettes   Smokeless tobacco: Never   Tobacco comments:    using patch  Vaping Use   Vaping Use: Never used  Substance and Sexual Activity   Alcohol use: No   Drug use: No   Sexual activity: Not Currently  Other Topics Concern   Not on file  Social History Narrative   Patient drinks 1-2 cups of caffeine daily.   Patient is right handed.   Lives with his sister [name]   Has a nurse/ Caregiver, [name] to help with bed baths, dressing, meals, etc 5 days per week   Social Determinants of Health   Financial Resource Strain: Low Risk  (08/31/2020)   Overall Financial Resource Strain (CARDIA)  Difficulty of Paying Living Expenses: Not very hard  Food Insecurity: No Food Insecurity (08/31/2020)   Hunger Vital Sign    Worried About Running Out of Food in the Last Year: Never true    Ran Out of Food in the Last Year: Never true  Transportation Needs: No Transportation Needs (08/31/2020)   PRAPARE - Administrator, Civil Service (Medical): No    Lack of Transportation (Non-Medical): No  Physical Activity: Inactive (08/31/2020)   Exercise Vital Sign    Days of Exercise per Week: 0 days    Minutes of Exercise per Session: 0 min  Stress: No Stress Concern Present (08/31/2020)   [employer] of Occupational Health - Occupational Stress Questionnaire    Feeling of Stress : Not at all  Social Connections: Moderately Isolated (08/31/2020)   Social Connection and Isolation Panel [NHANES]    Frequency of Communication with Friends and Family: Twice a week    Frequency of Social Gatherings with Friends and Family: More than three times a week    Attends Religious Services: More than 4 times per year    Active Member of [employer] or Organizations: No    Attends Banker Meetings: Never    Marital Status: Widowed   Family History  Problem Relation Age of Onset   Cancer Mother    Dementia Father    Colon cancer Neg Hx    Scheduled Meds:   aspirin EC  325 mg Oral Daily   atorvastatin  40 mg Oral Daily   fluticasone  2 spray Each Nare Daily   heparin  5,000 Units Subcutaneous Q8H   levETIRAcetam  250 mg Oral Daily   lubiprostone  24 mcg Oral Daily   pantoprazole  40 mg Oral Daily   PARoxetine  40 mg Oral Daily   scopolamine  1 patch Transdermal Q72H   Continuous Infusions:  sodium chloride Stopped (04/14/22 1015)   PRN Meds:.acetaminophen **OR** acetaminophen, albuterol, ondansetron **OR** ondansetron (ZOFRAN) IV, traZODone Medications Prior to Admission:  Prior to Admission medications   Medication Sig Start Date End Date Taking? Authorizing Provider  albuterol (PROVENTIL) (2.5 MG/3ML) 0.083% nebulizer solution Take 3 mLs (2.5 mg total) by nebulization daily as needed for wheezing or shortness of breath. 03/28/22 06/26/22  Milian, Aleen Campi, FNP  aspirin EC 325 MG tablet Take 1 tablet (325 mg total) by mouth daily. 03/28/22   Milian, Aleen Campi, FNP  atorvastatin (LIPITOR) 40 MG tablet Take daily in the evening 03/28/22   Milian, Aleen Campi, FNP  chlordiazePOXIDE (LIBRIUM) 25 MG capsule 50mg  PO TID x 1D, then 25-50mg  PO BID X 1D, then 25-50mg  PO QD X 1D 04/08/22   Kommor, [place], MD  cholecalciferol 25 MCG (1000 UT) tablet Take by mouth. 08/12/15   [provider]  donepezil (ARICEPT) 10 MG tablet Take Daily 03/28/22   Arrie Senate, FNP  fluticasone [facility]) 50 MCG/ACT nasal spray Place 2 sprays into both nostrils daily. 03/28/22 06/26/22  Arrie Senate, FNP  levETIRAcetam (KEPPRA) 250 MG tablet Needs to be seen by neuro 03/28/22   Arrie Senate, FNP  lubiprostone (AMITIZA) 24 MCG capsule Take 1 capsule (24 mcg total) by mouth daily. 03/28/22   Milian, Aleen Campi, FNP  nitroGLYCERIN (NITROSTAT) 0.4 MG SL tablet Place 1 tablet (0.4 mg total) under the tongue every 5 (five) minutes as needed for chest pain (Max of 3 doses at one time). PLACE 1 TAB UNDER TONGUE EVERY 5 MIN IF  NEEDED  FOR CHEST PAIN. MAY USE 3 TIMES If NO RELIEF CALL 911. 03/28/22   Arrie Senate, FNP  omeprazole (PRILOSEC) 20 MG capsule Take daily 03/28/22   Milian, Aleen Campi, FNP  PARoxetine (PAXIL) 40 MG tablet Take 1 tablet (40 mg total) by mouth daily. 03/28/22 06/26/22  Arrie Senate, FNP  Respiratory Therapy Supplies (NEBULIZER/TUBING/MOUTHPIECE) KIT Disp one nebulizer machine, tubing set and mouthpiece kit 08/17/17   Danelle Berry, PA-C  traZODone (DESYREL) 100 MG tablet TAKE (1) TABLET BY MOUTH AT BEDTIME AS NEEDED FOR SLEEP 03/28/22   Milian, Aleen Campi, FNP   Allergies  Allergen Reactions   Sulfa Antibiotics    Review of Systems  Unable to perform ROS: Dementia    Physical Exam Vitals and nursing note reviewed.  Constitutional:      General: He is not in acute distress.    Appearance: He is ill-appearing.  HENT:     Mouth/Throat:     Mouth: Mucous membranes are moist.     Comments: Poor dentition Cardiovascular:     Rate and Rhythm: Normal rate.  Pulmonary:     Effort: Pulmonary effort is normal. No respiratory distress.  Skin:    General: Skin is warm and dry.  Neurological:     Mental Status: He is alert.     Comments: Known dementia  Psychiatric:        Mood and Affect: Mood normal.        Behavior: Behavior normal.     Comments: Calm and cooperative, not fearful     Vital Signs: BP (!) 155/64   Pulse 67   Temp (!) 97.5 F (36.4 C) (Oral)   Resp 16   Ht  (1.753 m)   Wt 54 kg   SpO2 94%   BMI 17.58 kg/m  Pain Scale: 0-10   Pain Score: Asleep   SpO2: SpO2: 94 % O2 Device:SpO2: 94 % O2 Flow Rate: .O2 Flow Rate (L/min): 2 L/min  IO: Intake/output summary:  Intake/Output Summary (Last 24 hours) at 04/17/2022 1303 Last data filed at 04/17/2022 0300 Gross per 24 hour  Intake --  Output 300 ml  Net -300 ml    LBM: Last BM Date : 04/12/22 Baseline Weight: Weight: 54 kg Most recent weight: Weight: 54 kg     Palliative  Assessment/Data:     Time In: 1110 Time Out: 1225 Time Total: 75 minutes  Greater than 50%  of this time was spent counseling and coordinating care related to the above assessment and plan.  Signed by: Katheran Awe, NP   Please contact Palliative Medicine Team phone at [phone] for questions and concerns.  For individual provider: See Loretha Stapler

## 2022-04-17 NOTE — TOC Progression Note (Signed)
Transition of Care [facility]) - Progression Note    Patient Details  Name: [name] MRN: [id number] Date of Birth: 04/24/1934  [facility]) CM/SW Contact  Elliot Gault, LCSW Phone Number: 04/17/2022, 1:51 PM  Clinical Narrative:     TOC following. Per Palliative APNP, family requesting [facility] referral.  Referral made to [facility] at [facility]. Awaiting evaluation and determination. Will follow and assist.  Expected Discharge Plan: Hospice Medical Facility Barriers to Discharge: Continued Medical Work up  Expected Discharge Plan and Services In-house Referral: Clinical Social Work   Post Acute Care Choice: Skilled Nursing Facility Living arrangements for the past 2 months: Single Family Home                                       Social Determinants of Health (SDOH) Interventions SDOH Screenings   Food Insecurity: No Food Insecurity (08/31/2020)  Housing: Low Risk  (08/31/2020)  Transportation Needs: No Transportation Needs (08/31/2020)  Alcohol Screen: Low Risk  (08/31/2020)  Depression (PHQ2-9): Low Risk  (12/30/2021)  Financial Resource Strain: Low Risk  (08/31/2020)  Physical Activity: Inactive (08/31/2020)  Social Connections: Moderately Isolated (08/31/2020)  Stress: No Stress Concern Present (08/31/2020)  Tobacco Use: High Risk (04/17/2022)    Readmission Risk Interventions     No data to display

## 2022-04-18 DIAGNOSIS — F419 Anxiety disorder, unspecified: Secondary | ICD-10-CM

## 2022-04-18 DIAGNOSIS — R001 Bradycardia, unspecified: Secondary | ICD-10-CM | POA: Diagnosis not present

## 2022-04-18 DIAGNOSIS — E860 Dehydration: Principal | ICD-10-CM

## 2022-04-18 DIAGNOSIS — F03B0 Unspecified dementia, moderate, without behavioral disturbance, psychotic disturbance, mood disturbance, and anxiety: Secondary | ICD-10-CM

## 2022-04-18 DIAGNOSIS — R569 Unspecified convulsions: Secondary | ICD-10-CM | POA: Diagnosis not present

## 2022-04-18 DIAGNOSIS — K2100 Gastro-esophageal reflux disease with esophagitis, without bleeding: Secondary | ICD-10-CM | POA: Diagnosis not present

## 2022-04-18 MED ORDER — SCOPOLAMINE 1 MG/3DAYS TD PT72: 1.0000 | MEDICATED_PATCH | TRANSDERMAL | Status: AC

## 2022-04-18 MED ORDER — ACETAMINOPHEN 325 MG PO TABS: 650.0000 mg | ORAL_TABLET | Freq: Four times a day (QID) | ORAL | Status: AC | PRN

## 2022-04-18 NOTE — Progress Notes (Signed)
Palliative: [name] is sitting up in bed.  He appears acutely/chronically ill and very frail.  He is alert, making and somewhat keeping eye contact.  I do not ask orientation questions.  I am not sure that he can make his basic needs known.  There is no family at bedside at this time.  [name] has been accepted with [facility] hospice services to their residential hospice, [facility].  Conference with attending, bedside nursing staff, transition of care team related to patient condition, needs, goals of care, disposition.  Plan:   Comfort and dignity at end-of-life, residential hospice at [facility]. DNR/goldenrod form completed and placed on chart.          35 minutes  Lillia Carmel, NP Palliative medicine team Team phone [phone] Greater than 50% of this time was spent counseling and coordinating care related to the above assessment and plan.

## 2022-04-18 NOTE — TOC Transition Note (Signed)
Transition of Care [facility]) - CM/SW Discharge Note   Patient Details  Name: Stpehen Petitjean MRN: [id number] Date of Birth: 04/17/1934  Transition of Care [facility]) CM/SW Contact:  Elliot Gault, LCSW Phone Number: 04/18/2022, 1:18 PM   Clinical Narrative:     TOC following. Pt accepted for hosp[facility] care at [facility] and they can admit today. Updated MD and RN. Niece aware and in agreement with transfer by EMS.  RN to call report. EMS arranged.   No further TOC needs for dc.  Final next level of care: Hospice Medical Facility Barriers to Discharge: Barriers Resolved   Patient Goals and CMS Choice CMS Medicare.gov Compare Post Acute Care list provided to:: Patient Represenative (must comment) Choice offered to / list presented to : Adult Children  Discharge Placement                         Discharge Plan and Services Additional resources added to the After Visit Summary for   In-house Referral: Clinical Social Work   Post Acute Care Choice: Skilled Nursing Facility                               Social Determinants of Health (SDOH) Interventions SDOH Screenings   Food Insecurity: No Food Insecurity (08/31/2020)  Housing: Low Risk  (08/31/2020)  Transportation Needs: No Transportation Needs (08/31/2020)  Alcohol Screen: Low Risk  (08/31/2020)  Depression (PHQ2-9): Low Risk  (12/30/2021)  Financial Resource Strain: Low Risk  (08/31/2020)  Physical Activity: Inactive (08/31/2020)  Social Connections: Moderately Isolated (08/31/2020)  Stress: No Stress Concern Present (08/31/2020)  Tobacco Use: High Risk (04/17/2022)     Readmission Risk Interventions     No data to display

## 2022-04-18 NOTE — TOC Progression Note (Signed)
Transition of Care [facility]) - Progression Note    Patient Details  Name: [name] MRN: [id number] Date of Birth: 21-Oct-1934  Transition of Care [facility]) CM/SW Contact  Leitha Bleak, RN Phone Number: 04/18/2022, 10:46 AM  Clinical Narrative:   Team updated, [name] will send an RN to assess patient around 10am. TOC following.    Expected Discharge Plan: Hospice Medical Facility Barriers to Discharge: Continued Medical Work up  Expected Discharge Plan and Services In-house Referral: Clinical Social Work   Post Acute Care Choice: Skilled Nursing Facility Living arrangements for the past 2 months: Single Family Home                    Social Determinants of Health (SDOH) Interventions SDOH Screenings   Food Insecurity: No Food Insecurity (08/31/2020)  Housing: Low Risk  (08/31/2020)  Transportation Needs: No Transportation Needs (08/31/2020)  Alcohol Screen: Low Risk  (08/31/2020)  Depression (PHQ2-9): Low Risk  (12/30/2021)  Financial Resource Strain: Low Risk  (08/31/2020)  Physical Activity: Inactive (08/31/2020)  Social Connections: Moderately Isolated (08/31/2020)  Stress: No Stress Concern Present (08/31/2020)  Tobacco Use: High Risk (04/17/2022)    Readmission Risk Interventions     No data to display

## 2022-04-18 NOTE — Progress Notes (Signed)
Called report to [place] to nurse Marylene Land at 1330. Pt resting in room awaiting transport.

## 2022-04-18 NOTE — Discharge Summary (Signed)
Physician Discharge Summary   Patient: [name] MRN: [id number] DOB: 03/29/1934  Admit date:     04/14/2022  Discharge date: 04/18/22  Discharge Physician: Vassie Loll   PCP: Arrie Senate, FNP   Recommendations at discharge:  Full comfort care and symptomatic management.  Discharge Diagnoses: Principal Problem:   Bradycardia Active Problems:   Primary hypertension   GERD   Constipation   Chronic insomnia   Protein-calorie malnutrition   Seizure   Cerebrovascular accident (CVA) due to embolism of left middle cerebral artery   Anxiety   Chronic obstructive pulmonary disease, unspecified COPD type   Dehydration  Resolved Problems:   * No resolved hospital problems. *  Hospital Course: [name] is a 88 y.o. male with medical history significant of advanced dementia, gastroesophageal flux disease, hypertension, prior history of stroke, COPD, hyperlipidemia and constipation; who presented to the hospital secondary to worsening confusion and concern for acute stroke. At home patient was demonstrating right-sided weakness; he is mainly wheelchair dependent and have baseline left-sided hemiplegia from previous stroke.  Per family he has been struggling with mentation and decreased oral intake for the last 2 weeks or so after he was taken off benzodiazepine therapy.  Patient with recent visit to the emergency department given concern for benzo withdrawal treated with Librium tapering; no signs of withdrawal on examination. CT scan negative for acute abnormality and MRI negative for acute stroke.  Remote infarcts appreciated. There has not been complaints of seizure activity and there is no signs of acute infection. Mild dehydration suspected and while in the ED telemetry was able to capture some abnormal rate and pulses. Patient seen by cardiology and no concerns for further workup or intervention at the moment.  Recommendations given to stop Aricept.   Patient actively  declining from further progression of underlying dementia; at high risk for aspiration and with very poor/guarded prognosis.  Palliative care has been consulted for assistant determining candidacy for inpatient hospice and symptomatic management.  TOC has been made aware.  Assessment and Plan: * Bradycardia -No significant abnormalities or concerns as per cardiology evaluation/inputs at time of admission -Telemetry demonstrating sinus rhythm. -TSH within normal limits -Aricept has been discontinued. -Now transitioned to full comfort care and symptomatic management only.   Chronic obstructive pulmonary disease, unspecified COPD type -Continue as needed bronchodilator management and the use of Flonase -No acute exacerbation currently appreciated. -Focusing on comfort care.   Anxiety -Continue the use of Paxil.   Cerebrovascular accident (CVA) due to embolism of left middle cerebral artery -CT scan of the head and MRI negative -Patient confusion status appears to be most likely secondary to further deconditioning and advance of his underlying dementia along with dehydration at time of presentation. -Overall prognosis poor/guarded.   -After discussing with patient's brother he expressed overwhelming situation caring for him at home at this time as he used to live with his sister who passed away 3 weeks ago.   -Despite having assistance from a caregiver Monday through Friday there is still difficult to properly care for him. -There has not been any signs of acute infection; patient remains at high risk for aspiration due to underlying dementia and his diet has been downgraded.   -Appreciate recommendation by speech therapy and further discussion of goals of care by palliative service. -Patient has been transitioned to full comfort. -Discharge to residential hospice for further care, dignity and end-of-life.   Seizure -Resume the use of Keppra. -No seizure activity appreciated.  Protein-calorie malnutrition -Body mass index is 17.58 kg/m. -Severe protein calorie malnutrition -Now transition to full comfort care; comfort feeding can be allowed with supervision.   Chronic insomnia -Continue as needed trazodone.   Constipation -Continue the use of Amitiza -Follow response and follow up response and further add bowel regimen meds as needed.   GERD -Continue PPI.   Primary hypertension -Vital signs stable -Prior to admission no using any antihypertensive agents -Transitioning to full comfort care at the moment.   Hypokalemia/hypomagnesemia -Repleted. -Patient now transition to full comfort; no further anticipated blood draws or following labs expected. -Continue symptomatic management only.  Consultants: Cardiology service, palliative care and hospice. Procedures performed: See below for x-ray reports. Disposition: Hospice care Diet recommendation: Comfort feeding.  DISCHARGE MEDICATION: Allergies as of 04/18/2022       Reactions   Sulfa Antibiotics         Medication List     STOP taking these medications    aspirin EC 325 MG tablet   atorvastatin 40 MG tablet Commonly known as: LIPITOR   chlordiazePOXIDE 25 MG capsule Commonly known as: LIBRIUM   cholecalciferol 25 MCG (1000 UNIT) tablet Commonly known as: VITAMIN D3   donepezil 10 MG tablet Commonly known as: ARICEPT   Nebulizer/Tubing/Mouthpiece Kit   nitroGLYCERIN 0.4 MG SL tablet Commonly known as: Nitrostat       TAKE these medications    acetaminophen 325 MG tablet Commonly known as: TYLENOL Take 2 tablets (650 mg total) by mouth every 6 (six) hours as needed for mild pain (or Fever >/= 101).   albuterol (2.5 MG/3ML) 0.083% nebulizer solution Commonly known as: PROVENTIL Take 3 mLs (2.5 mg total) by nebulization daily as needed for wheezing or shortness of breath.   fluticasone 50 MCG/ACT nasal spray Commonly known as: FLONASE Place 2 sprays into both  nostrils daily.   levETIRAcetam 250 MG tablet Commonly known as: KEPPRA Needs to be seen by neuro   lubiprostone 24 MCG capsule Commonly known as: AMITIZA Take 1 capsule (24 mcg total) by mouth daily.   omeprazole 20 MG capsule Commonly known as: PRILOSEC Take daily   PARoxetine 40 MG tablet Commonly known as: PAXIL Take 1 tablet (40 mg total) by mouth daily.   scopolamine 1 MG/3DAYS Commonly known as: TRANSDERM-SCOP Place 1 patch (1.5 mg total) onto the skin every 3 (three) days. Start taking on: April 19, 2022   traZODone 100 MG tablet Commonly known as: DESYREL TAKE (1) TABLET BY MOUTH AT BEDTIME AS NEEDED FOR SLEEP        Discharge Exam: Filed Weights   04/14/22 1006  Weight: 54 kg   General exam: Sleepy, no eating, not drinking, expressing no pain.  No fever. Respiratory system: Increased upper secretions sounds appreciated; No using accessory muscles. Cardiovascular system: Rate controlled, no rubs, no gallops. Gastrointestinal system: Abdomen is nondistended, soft and nontender. No organomegaly or masses felt. Normal bowel sounds heard. Central nervous system: Limited examination with current mentation. Extremities: No cyanosis or clubbing. Skin: No petechiae. Psychiatry: Judgement and insight appear impaired secondary to underlying dementia.  Condition at discharge: in not major distress.  The results of significant diagnostics from this hospitalization (including imaging, microbiology, ancillary and laboratory) are listed below for reference.   Imaging Studies: MR BRAIN WO CONTRAST  Result Date: 04/14/2022 CLINICAL DATA:  Neuro deficit, acute, stroke suspected EXAM: MRI HEAD WITHOUT CONTRAST TECHNIQUE: Multiplanar, multiecho pulse sequences of the brain and surrounding structures were obtained without intravenous contrast. COMPARISON:  Same day CT head. FINDINGS: Brain: No acute infarction, acute hemorrhage, hydrocephalus, extra-axial collection or mass  lesion. Many remote infarcts in the cerebellum. Remote left frontal infarct. Patchy T2/FLAIR hyperintensities in the white matter, compatible with chronic microvascular ischemic disease. Cerebral atrophy. Vascular: Major arterial flow voids are maintained at the skull base. Skull and upper cervical spine: Normal marrow signal. Sinuses/Orbits: Clear sinuses.  No acute orbital findings. Other: No mastoid effusions. IMPRESSION: 1. No evidence of acute abnormality. 2. Remote infarcts. Electronically Signed   By: Feliberto Harts M.D.   On: 04/14/2022 11:46   DG Chest Portable 1 View  Result Date: 04/14/2022 CLINICAL DATA:  Shortness of breath. EXAM: PORTABLE CHEST 1 VIEW COMPARISON:  Chest radiographs 09/28/2021 and 08/24/2021. Chest CT 10/22/2018. FINDINGS: The cardiomediastinal silhouette is unchanged with normal heart size. A calcified lymph node is again noted in the AP window. Background fibrotic interstitial lung disease is again noted. Right upper lobe opacity appears increased compared to the 09/28/2021 radiograph but is more similar to the 08/24/2021 study, while densities elsewhere bilaterally are similar to the prior radiograph. No sizable pleural effusion or pneumothorax is identified. No acute osseous abnormality is seen. IMPRESSION: Chronic fibrotic interstitial lung disease. Mildly increased right upper lobe opacity could reflect differences in technique versus superimposed infection. Electronically Signed   By: Sebastian Ache M.D.   On: 04/14/2022 10:46   CT HEAD WO CONTRAST  Result Date: 04/14/2022 CLINICAL DATA:  Acute neuro deficit, stroke suspected. Weakness on the right side. EXAM: CT HEAD WITHOUT CONTRAST TECHNIQUE: Contiguous axial images were obtained from the base of the skull through the vertex without intravenous contrast. RADIATION DOSE REDUCTION: This exam was performed according to the departmental dose-optimization program which includes automated exposure control, adjustment of the  mA and/or kV according to patient size and/or use of iterative reconstruction technique. COMPARISON:  CT examination dated September 28, 2021 FINDINGS: Brain: No evidence of acute infarction, hemorrhage, hydrocephalus, extra-axial collection or mass lesion/mass effect. Moderate generalized cerebral atrophy and advanced chronic microvascular ischemic changes of the white matter, unchanged. Encephalomalacia of the right cerebellum suggesting chronic infarcts is also unchanged. Vascular: No hyperdense vessel or unexpected calcification. Skull: Normal. Negative for fracture or focal lesion. Sinuses/Orbits: No acute finding. Other: None. IMPRESSION: 1. No acute intracranial abnormality. 2. Moderate generalized cerebral and cerebellar atrophy and advanced chronic microvascular ischemic changes of the white matter, unchanged. 3. Chronic right cerebellar infarcts, unchanged. Electronically Signed   By: Larose Hires D.O.   On: 04/14/2022 10:34   CT ABDOMEN PELVIS WO CONTRAST  Result Date: 04/08/2022 CLINICAL DATA:  Constipation EXAM: CT ABDOMEN AND PELVIS WITHOUT CONTRAST TECHNIQUE: Multidetector CT imaging of the abdomen and pelvis was performed following the standard protocol without IV contrast. RADIATION DOSE REDUCTION: This exam was performed according to the departmental dose-optimization program which includes automated exposure control, adjustment of the mA and/or kV according to patient size and/or use of iterative reconstruction technique. COMPARISON:  11/04/2012 FINDINGS: Lower chest: Fibrotic changes are noted in the lower lung fields. Coronary artery calcifications are seen. Minimal pericardial effusion is present. Hepatobiliary: There are small scattered calcified granulomas. Gallbladder is not distended. There is no dilation of bile ducts. Pancreas: No focal abnormalities are seen. Spleen: Spleen is not enlarged. There are small calcified granulomas. Adrenals/Urinary Tract: There is mild hyperplasia of left  adrenal with no significant change. There is no hydronephrosis. There are no renal or ureteral stones. Urinary bladder is unremarkable. Stomach/Bowel: Small hiatal hernia is seen. Stomach  is unremarkable. There is 1 cm high density structure in the lumen of the duodenum, possibly oral medication. There is no significant small bowel dilation. Appendix is not dilated. Moderate amount of liquid stool is seen in colon and rectum. There is no evidence of any significant amount of solid stool in the colon. Scattered diverticula are seen in colon without signs of focal acute diverticulitis. Vascular/Lymphatic: Extensive arterial calcifications are seen in aorta and its major branches. Reproductive: Coarse calcifications are seen in prostate. Other: There is no ascites or pneumoperitoneum. Left inguinal hernia is seen. In the previous study, portion of colon was noted within the hernia which is not evident in the current study. There are linear densities in the left inguinal hernia, possibly suggesting interval surgery. There is a radiopaque mesh in the posterior aspect of right inguinal region, suggesting previous surgical repair of right inguinal hernia. Musculoskeletal: No acute findings are seen. IMPRESSION: Moderate to large amount of liquid stool is seen in the lumen of the colon and rectum. This may suggest nonspecific enterocolitis. There is no demonstrable solid appearing stool in the colon. There is no significant wall thickening in colon. There is no pericolic stranding or fluid collection. Scattered diverticula are seen in the colon without signs of focal acute diverticulitis. There is no evidence of intestinal obstruction or pneumoperitoneum. There is no hydronephrosis. Coronary artery disease. Arteriosclerosis. Scarring is seen in the lower lung fields. Electronically Signed   By: Ernie Avena M.D.   On: 04/08/2022 14:23   DG Abdomen 1 View  Result Date: 04/08/2022 CLINICAL DATA:  Constipation EXAM:  ABDOMEN - 1 VIEW COMPARISON:  11/04/2012 FINDINGS: Nonobstructive bowel gas pattern. No evidence of abnormal fecal retention. Abdominal aortic atherosclerosis. No acute bony abnormality. IMPRESSION: Nonobstructive bowel gas pattern. Electronically Signed   By: Duanne Guess D.O.   On: 04/08/2022 12:07    Microbiology: Results for orders placed or performed during the hospital encounter of 04/14/22  SARS Coronavirus 2 by RT PCR (hospital order, performed in [facility] hospital lab) *cepheid single result test* Anterior Nasal Swab     Status: None   Collection Time: 04/14/22 10:47 AM   Specimen: Anterior Nasal Swab  Result Value Ref Range Status   SARS Coronavirus 2 by RT PCR NEGATIVE NEGATIVE Final    Comment: (NOTE) SARS-CoV-2 target nucleic acids are NOT DETECTED.  The SARS-CoV-2 RNA is generally detectable in upper and lower respiratory specimens during the acute phase of infection. The lowest concentration of SARS-CoV-2 viral copies this assay can detect is 250 copies / mL. A negative result does not preclude SARS-CoV-2 infection and should not be used as the sole basis for treatment or other patient management decisions.  A negative result may occur with improper specimen collection / handling, submission of specimen other than nasopharyngeal swab, presence of viral mutation(s) within the areas targeted by this assay, and inadequate number of viral copies (<250 copies / mL). A negative result must be combined with clinical observations, patient history, and epidemiological information.  Fact Sheet for Patients:   RoadLapTop.co.za  Fact Sheet for Healthcare Providers: http://kim-miller.com/  This test is not yet approved or  cleared by the [place] FDA and has been authorized for detection and/or diagnosis of SARS-CoV-2 by FDA under an Emergency Use Authorization (EUA).  This EUA will remain in effect (meaning this test can be  used) for the duration of the COVID-19 declaration under Section 564(b)(1) of the Act, 21 U.S.C. section 360bbb-3(b)(1), unless the authorization is  terminated or revoked sooner.  Performed at [facility], [address] [place], [place] [address]   MRSA Next Gen by PCR, Nasal     Status: Abnormal   Collection Time: 04/17/22 12:22 PM   Specimen: Nasal Mucosa; Nasal Swab  Result Value Ref Range Status   MRSA by PCR Next Gen DETECTED (A) NOT DETECTED Final    Comment: RESULT CALLED TO, READ BACK BY AND VERIFIED WITH: S BLACKWELL AT 1414 ON [id number] BY S DALTON (NOTE) The GeneXpert MRSA Assay (FDA approved for NASAL specimens only), is one component of a comprehensive MRSA colonization surveillance program. It is not intended to diagnose MRSA infection nor to guide or monitor treatment for MRSA infections. Test performance is not FDA approved in patients less than 42 years old. Performed at [facility], [address] [place], [place] [address]     Labs: CBC: Recent Labs  Lab 04/14/22 1039 04/15/22 0539  WBC 7.6 6.5  NEUTROABS 4.2  --   HGB 13.0 13.5  HCT 40.1 41.5  MCV 95.5 95.0  PLT 174 154   Basic Metabolic Panel: Recent Labs  Lab 04/14/22 1039 04/14/22 1433 04/15/22 0539 04/16/22 0408  NA 140  --  137 140  K 3.6  --  3.2* 3.6  CL 107  --  107 105  CO2 27  --  24 23  GLUCOSE 98  --  81 95  BUN 14  --  9 15  CREATININE 0.91  --  0.72 0.82  CALCIUM 8.4*  --  8.1* 8.5*  MG  --  1.7 1.6* 2.0  PHOS  --   --  2.7  --    Liver Function Tests: Recent Labs  Lab 04/14/22 1039  AST 73*  ALT 45*  ALKPHOS 79  BILITOT 0.9  PROT 6.9  ALBUMIN 3.1*   CBG: Recent Labs  Lab 04/14/22 0958  GLUCAP 82    Discharge time spent: greater than 30 minutes.  Signed: Vassie Loll, MD [facility] 04/18/2022

## 2022-04-19 ENCOUNTER — Telehealth: Payer: Self-pay | Admitting: Family Medicine

## 2022-04-19 NOTE — Telephone Encounter (Signed)
Provider aware

## 2022-04-24 ENCOUNTER — Institutional Professional Consult (permissible substitution): Payer: 59 | Admitting: Internal Medicine

## 2022-04-27 ENCOUNTER — Ambulatory Visit: Payer: 59 | Admitting: Family Medicine

## 2022-05-30 DIAGNOSIS — D649 Anemia, unspecified: Secondary | ICD-10-CM | POA: Diagnosis not present

## 2022-05-30 DIAGNOSIS — F0284 Dementia in other diseases classified elsewhere, unspecified severity, with anxiety: Secondary | ICD-10-CM | POA: Diagnosis not present

## 2022-05-30 DIAGNOSIS — Z79899 Other long term (current) drug therapy: Secondary | ICD-10-CM | POA: Diagnosis not present

## 2022-05-30 DIAGNOSIS — E559 Vitamin D deficiency, unspecified: Secondary | ICD-10-CM | POA: Diagnosis not present

## 2022-05-30 DIAGNOSIS — E119 Type 2 diabetes mellitus without complications: Secondary | ICD-10-CM | POA: Diagnosis not present

## 2022-05-30 DIAGNOSIS — E539 Vitamin B deficiency, unspecified: Secondary | ICD-10-CM | POA: Diagnosis not present

## 2022-08-09 DIAGNOSIS — R52 Pain, unspecified: Secondary | ICD-10-CM | POA: Diagnosis not present

## 2022-08-09 DIAGNOSIS — R531 Weakness: Secondary | ICD-10-CM | POA: Diagnosis not present

## 2022-08-09 DIAGNOSIS — Z79899 Other long term (current) drug therapy: Secondary | ICD-10-CM | POA: Diagnosis not present

## 2022-08-09 DIAGNOSIS — E46 Unspecified protein-calorie malnutrition: Secondary | ICD-10-CM | POA: Diagnosis not present

## 2022-09-14 DIAGNOSIS — E46 Unspecified protein-calorie malnutrition: Secondary | ICD-10-CM | POA: Diagnosis not present

## 2022-09-14 DIAGNOSIS — R531 Weakness: Secondary | ICD-10-CM | POA: Diagnosis not present

## 2022-09-14 DIAGNOSIS — R52 Pain, unspecified: Secondary | ICD-10-CM | POA: Diagnosis not present

## 2022-09-14 DIAGNOSIS — Z79899 Other long term (current) drug therapy: Secondary | ICD-10-CM | POA: Diagnosis not present

## 2022-10-02 DIAGNOSIS — R52 Pain, unspecified: Secondary | ICD-10-CM | POA: Diagnosis not present

## 2022-10-02 DIAGNOSIS — Z9181 History of falling: Secondary | ICD-10-CM | POA: Diagnosis not present

## 2022-10-02 DIAGNOSIS — Z79899 Other long term (current) drug therapy: Secondary | ICD-10-CM | POA: Diagnosis not present

## 2022-10-02 DIAGNOSIS — R531 Weakness: Secondary | ICD-10-CM | POA: Diagnosis not present

## 2022-10-19 DIAGNOSIS — M6281 Muscle weakness (generalized): Secondary | ICD-10-CM | POA: Diagnosis not present

## 2022-12-08 DIAGNOSIS — M6281 Muscle weakness (generalized): Secondary | ICD-10-CM | POA: Diagnosis not present

## 2022-12-09 DIAGNOSIS — M6281 Muscle weakness (generalized): Secondary | ICD-10-CM | POA: Diagnosis not present

## 2022-12-10 DIAGNOSIS — M6281 Muscle weakness (generalized): Secondary | ICD-10-CM | POA: Diagnosis not present

## 2022-12-11 DIAGNOSIS — M6281 Muscle weakness (generalized): Secondary | ICD-10-CM | POA: Diagnosis not present

## 2022-12-12 DIAGNOSIS — M6281 Muscle weakness (generalized): Secondary | ICD-10-CM | POA: Diagnosis not present

## 2022-12-13 DIAGNOSIS — M6281 Muscle weakness (generalized): Secondary | ICD-10-CM | POA: Diagnosis not present

## 2022-12-18 DIAGNOSIS — M6281 Muscle weakness (generalized): Secondary | ICD-10-CM | POA: Diagnosis not present

## 2022-12-19 DIAGNOSIS — M6281 Muscle weakness (generalized): Secondary | ICD-10-CM | POA: Diagnosis not present

## 2022-12-20 DIAGNOSIS — M6281 Muscle weakness (generalized): Secondary | ICD-10-CM | POA: Diagnosis not present

## 2022-12-21 DIAGNOSIS — M6281 Muscle weakness (generalized): Secondary | ICD-10-CM | POA: Diagnosis not present

## 2022-12-23 DIAGNOSIS — M6281 Muscle weakness (generalized): Secondary | ICD-10-CM | POA: Diagnosis not present

## 2022-12-24 DIAGNOSIS — M6281 Muscle weakness (generalized): Secondary | ICD-10-CM | POA: Diagnosis not present

## 2022-12-25 DIAGNOSIS — M6281 Muscle weakness (generalized): Secondary | ICD-10-CM | POA: Diagnosis not present

## 2022-12-26 DIAGNOSIS — M6281 Muscle weakness (generalized): Secondary | ICD-10-CM | POA: Diagnosis not present

## 2023-01-25 DIAGNOSIS — B351 Tinea unguium: Secondary | ICD-10-CM | POA: Diagnosis not present

## 2023-01-25 DIAGNOSIS — M79676 Pain in unspecified toe(s): Secondary | ICD-10-CM | POA: Diagnosis not present

## 2023-02-22 DIAGNOSIS — M6281 Muscle weakness (generalized): Secondary | ICD-10-CM | POA: Diagnosis not present

## 2023-02-22 DIAGNOSIS — R2689 Other abnormalities of gait and mobility: Secondary | ICD-10-CM | POA: Diagnosis not present

## 2023-02-23 DIAGNOSIS — R2689 Other abnormalities of gait and mobility: Secondary | ICD-10-CM | POA: Diagnosis not present

## 2023-02-23 DIAGNOSIS — M6281 Muscle weakness (generalized): Secondary | ICD-10-CM | POA: Diagnosis not present

## 2023-02-25 DIAGNOSIS — M6281 Muscle weakness (generalized): Secondary | ICD-10-CM | POA: Diagnosis not present

## 2023-02-25 DIAGNOSIS — R2689 Other abnormalities of gait and mobility: Secondary | ICD-10-CM | POA: Diagnosis not present

## 2023-02-26 DIAGNOSIS — M6281 Muscle weakness (generalized): Secondary | ICD-10-CM | POA: Diagnosis not present

## 2023-02-26 DIAGNOSIS — R2689 Other abnormalities of gait and mobility: Secondary | ICD-10-CM | POA: Diagnosis not present

## 2023-02-27 DIAGNOSIS — M6281 Muscle weakness (generalized): Secondary | ICD-10-CM | POA: Diagnosis not present

## 2023-02-27 DIAGNOSIS — R2689 Other abnormalities of gait and mobility: Secondary | ICD-10-CM | POA: Diagnosis not present

## 2023-02-28 DIAGNOSIS — M6281 Muscle weakness (generalized): Secondary | ICD-10-CM | POA: Diagnosis not present

## 2023-02-28 DIAGNOSIS — R2689 Other abnormalities of gait and mobility: Secondary | ICD-10-CM | POA: Diagnosis not present

## 2023-03-01 DIAGNOSIS — R2689 Other abnormalities of gait and mobility: Secondary | ICD-10-CM | POA: Diagnosis not present

## 2023-03-01 DIAGNOSIS — M6281 Muscle weakness (generalized): Secondary | ICD-10-CM | POA: Diagnosis not present

## 2023-03-02 DIAGNOSIS — M6281 Muscle weakness (generalized): Secondary | ICD-10-CM | POA: Diagnosis not present

## 2023-03-02 DIAGNOSIS — R2689 Other abnormalities of gait and mobility: Secondary | ICD-10-CM | POA: Diagnosis not present

## 2023-03-05 DIAGNOSIS — R2689 Other abnormalities of gait and mobility: Secondary | ICD-10-CM | POA: Diagnosis not present

## 2023-03-05 DIAGNOSIS — M6281 Muscle weakness (generalized): Secondary | ICD-10-CM | POA: Diagnosis not present

## 2023-03-07 DIAGNOSIS — M6281 Muscle weakness (generalized): Secondary | ICD-10-CM | POA: Diagnosis not present

## 2023-03-07 DIAGNOSIS — R2689 Other abnormalities of gait and mobility: Secondary | ICD-10-CM | POA: Diagnosis not present

## 2023-03-08 DIAGNOSIS — R2689 Other abnormalities of gait and mobility: Secondary | ICD-10-CM | POA: Diagnosis not present

## 2023-03-08 DIAGNOSIS — M6281 Muscle weakness (generalized): Secondary | ICD-10-CM | POA: Diagnosis not present

## 2023-03-09 DIAGNOSIS — R2689 Other abnormalities of gait and mobility: Secondary | ICD-10-CM | POA: Diagnosis not present

## 2023-03-09 DIAGNOSIS — M6281 Muscle weakness (generalized): Secondary | ICD-10-CM | POA: Diagnosis not present

## 2023-03-10 DIAGNOSIS — M6281 Muscle weakness (generalized): Secondary | ICD-10-CM | POA: Diagnosis not present

## 2023-03-10 DIAGNOSIS — R2689 Other abnormalities of gait and mobility: Secondary | ICD-10-CM | POA: Diagnosis not present

## 2023-03-13 DIAGNOSIS — M6281 Muscle weakness (generalized): Secondary | ICD-10-CM | POA: Diagnosis not present

## 2023-03-13 DIAGNOSIS — R2689 Other abnormalities of gait and mobility: Secondary | ICD-10-CM | POA: Diagnosis not present

## 2023-03-14 DIAGNOSIS — M6281 Muscle weakness (generalized): Secondary | ICD-10-CM | POA: Diagnosis not present

## 2023-03-14 DIAGNOSIS — R2689 Other abnormalities of gait and mobility: Secondary | ICD-10-CM | POA: Diagnosis not present

## 2023-03-15 DIAGNOSIS — M6281 Muscle weakness (generalized): Secondary | ICD-10-CM | POA: Diagnosis not present

## 2023-03-15 DIAGNOSIS — R2689 Other abnormalities of gait and mobility: Secondary | ICD-10-CM | POA: Diagnosis not present

## 2023-03-16 DIAGNOSIS — M6281 Muscle weakness (generalized): Secondary | ICD-10-CM | POA: Diagnosis not present

## 2023-03-16 DIAGNOSIS — R2689 Other abnormalities of gait and mobility: Secondary | ICD-10-CM | POA: Diagnosis not present

## 2023-03-17 DIAGNOSIS — R2689 Other abnormalities of gait and mobility: Secondary | ICD-10-CM | POA: Diagnosis not present

## 2023-03-17 DIAGNOSIS — M6281 Muscle weakness (generalized): Secondary | ICD-10-CM | POA: Diagnosis not present

## 2023-03-20 DIAGNOSIS — M6281 Muscle weakness (generalized): Secondary | ICD-10-CM | POA: Diagnosis not present

## 2023-03-20 DIAGNOSIS — R2689 Other abnormalities of gait and mobility: Secondary | ICD-10-CM | POA: Diagnosis not present

## 2023-03-22 DIAGNOSIS — M6281 Muscle weakness (generalized): Secondary | ICD-10-CM | POA: Diagnosis not present

## 2023-03-22 DIAGNOSIS — R2689 Other abnormalities of gait and mobility: Secondary | ICD-10-CM | POA: Diagnosis not present

## 2023-03-23 DIAGNOSIS — M6281 Muscle weakness (generalized): Secondary | ICD-10-CM | POA: Diagnosis not present

## 2023-03-23 DIAGNOSIS — R2689 Other abnormalities of gait and mobility: Secondary | ICD-10-CM | POA: Diagnosis not present

## 2023-03-24 DIAGNOSIS — M6281 Muscle weakness (generalized): Secondary | ICD-10-CM | POA: Diagnosis not present

## 2023-03-24 DIAGNOSIS — R2689 Other abnormalities of gait and mobility: Secondary | ICD-10-CM | POA: Diagnosis not present

## 2023-03-25 DIAGNOSIS — R2689 Other abnormalities of gait and mobility: Secondary | ICD-10-CM | POA: Diagnosis not present

## 2023-03-25 DIAGNOSIS — M6281 Muscle weakness (generalized): Secondary | ICD-10-CM | POA: Diagnosis not present

## 2023-03-26 DIAGNOSIS — M6281 Muscle weakness (generalized): Secondary | ICD-10-CM | POA: Diagnosis not present

## 2023-03-26 DIAGNOSIS — R2689 Other abnormalities of gait and mobility: Secondary | ICD-10-CM | POA: Diagnosis not present

## 2023-03-27 DIAGNOSIS — M6281 Muscle weakness (generalized): Secondary | ICD-10-CM | POA: Diagnosis not present

## 2023-03-27 DIAGNOSIS — R2689 Other abnormalities of gait and mobility: Secondary | ICD-10-CM | POA: Diagnosis not present

## 2023-03-29 DIAGNOSIS — M6281 Muscle weakness (generalized): Secondary | ICD-10-CM | POA: Diagnosis not present

## 2023-03-29 DIAGNOSIS — R2689 Other abnormalities of gait and mobility: Secondary | ICD-10-CM | POA: Diagnosis not present

## 2023-03-30 DIAGNOSIS — R2689 Other abnormalities of gait and mobility: Secondary | ICD-10-CM | POA: Diagnosis not present

## 2023-03-30 DIAGNOSIS — M6281 Muscle weakness (generalized): Secondary | ICD-10-CM | POA: Diagnosis not present

## 2023-03-31 DIAGNOSIS — H524 Presbyopia: Secondary | ICD-10-CM | POA: Diagnosis not present

## 2023-03-31 DIAGNOSIS — H5213 Myopia, bilateral: Secondary | ICD-10-CM | POA: Diagnosis not present

## 2023-04-02 DIAGNOSIS — M6281 Muscle weakness (generalized): Secondary | ICD-10-CM | POA: Diagnosis not present

## 2023-04-02 DIAGNOSIS — R2689 Other abnormalities of gait and mobility: Secondary | ICD-10-CM | POA: Diagnosis not present

## 2023-04-03 DIAGNOSIS — M6281 Muscle weakness (generalized): Secondary | ICD-10-CM | POA: Diagnosis not present

## 2023-04-03 DIAGNOSIS — R2689 Other abnormalities of gait and mobility: Secondary | ICD-10-CM | POA: Diagnosis not present

## 2023-04-05 DIAGNOSIS — R2689 Other abnormalities of gait and mobility: Secondary | ICD-10-CM | POA: Diagnosis not present

## 2023-04-05 DIAGNOSIS — M6281 Muscle weakness (generalized): Secondary | ICD-10-CM | POA: Diagnosis not present

## 2023-04-06 DIAGNOSIS — R2689 Other abnormalities of gait and mobility: Secondary | ICD-10-CM | POA: Diagnosis not present

## 2023-04-06 DIAGNOSIS — M6281 Muscle weakness (generalized): Secondary | ICD-10-CM | POA: Diagnosis not present

## 2023-04-06 DIAGNOSIS — I69811 Memory deficit following other cerebrovascular disease: Secondary | ICD-10-CM | POA: Diagnosis not present

## 2023-04-07 DIAGNOSIS — R2689 Other abnormalities of gait and mobility: Secondary | ICD-10-CM | POA: Diagnosis not present

## 2023-04-07 DIAGNOSIS — M6281 Muscle weakness (generalized): Secondary | ICD-10-CM | POA: Diagnosis not present

## 2023-04-09 DIAGNOSIS — R2689 Other abnormalities of gait and mobility: Secondary | ICD-10-CM | POA: Diagnosis not present

## 2023-04-09 DIAGNOSIS — M6281 Muscle weakness (generalized): Secondary | ICD-10-CM | POA: Diagnosis not present

## 2023-04-10 DIAGNOSIS — M6281 Muscle weakness (generalized): Secondary | ICD-10-CM | POA: Diagnosis not present

## 2023-04-10 DIAGNOSIS — R2689 Other abnormalities of gait and mobility: Secondary | ICD-10-CM | POA: Diagnosis not present

## 2023-04-11 DIAGNOSIS — R2689 Other abnormalities of gait and mobility: Secondary | ICD-10-CM | POA: Diagnosis not present

## 2023-04-11 DIAGNOSIS — M6281 Muscle weakness (generalized): Secondary | ICD-10-CM | POA: Diagnosis not present

## 2023-04-12 DIAGNOSIS — R0989 Other specified symptoms and signs involving the circulatory and respiratory systems: Secondary | ICD-10-CM | POA: Diagnosis not present

## 2023-04-12 DIAGNOSIS — R059 Cough, unspecified: Secondary | ICD-10-CM | POA: Diagnosis not present

## 2023-04-30 DIAGNOSIS — H47212 Primary optic atrophy, left eye: Secondary | ICD-10-CM | POA: Diagnosis not present

## 2023-05-17 DIAGNOSIS — Z23 Encounter for immunization: Secondary | ICD-10-CM | POA: Diagnosis not present

## 2023-05-24 DIAGNOSIS — I739 Peripheral vascular disease, unspecified: Secondary | ICD-10-CM | POA: Diagnosis not present

## 2023-05-24 DIAGNOSIS — L603 Nail dystrophy: Secondary | ICD-10-CM | POA: Diagnosis not present

## 2023-05-24 DIAGNOSIS — L602 Onychogryphosis: Secondary | ICD-10-CM | POA: Diagnosis not present

## 2023-06-07 DIAGNOSIS — R2689 Other abnormalities of gait and mobility: Secondary | ICD-10-CM | POA: Diagnosis not present

## 2023-06-07 DIAGNOSIS — R296 Repeated falls: Secondary | ICD-10-CM | POA: Diagnosis not present

## 2023-06-07 DIAGNOSIS — M6281 Muscle weakness (generalized): Secondary | ICD-10-CM | POA: Diagnosis not present

## 2023-06-07 DIAGNOSIS — R269 Unspecified abnormalities of gait and mobility: Secondary | ICD-10-CM | POA: Diagnosis not present

## 2023-06-08 DIAGNOSIS — R269 Unspecified abnormalities of gait and mobility: Secondary | ICD-10-CM | POA: Diagnosis not present

## 2023-06-08 DIAGNOSIS — R2689 Other abnormalities of gait and mobility: Secondary | ICD-10-CM | POA: Diagnosis not present

## 2023-06-08 DIAGNOSIS — R296 Repeated falls: Secondary | ICD-10-CM | POA: Diagnosis not present

## 2023-06-08 DIAGNOSIS — M6281 Muscle weakness (generalized): Secondary | ICD-10-CM | POA: Diagnosis not present

## 2023-06-11 DIAGNOSIS — R2689 Other abnormalities of gait and mobility: Secondary | ICD-10-CM | POA: Diagnosis not present

## 2023-06-11 DIAGNOSIS — M6281 Muscle weakness (generalized): Secondary | ICD-10-CM | POA: Diagnosis not present

## 2023-06-11 DIAGNOSIS — R269 Unspecified abnormalities of gait and mobility: Secondary | ICD-10-CM | POA: Diagnosis not present

## 2023-06-11 DIAGNOSIS — R296 Repeated falls: Secondary | ICD-10-CM | POA: Diagnosis not present

## 2023-06-12 DIAGNOSIS — M6281 Muscle weakness (generalized): Secondary | ICD-10-CM | POA: Diagnosis not present

## 2023-06-12 DIAGNOSIS — R2689 Other abnormalities of gait and mobility: Secondary | ICD-10-CM | POA: Diagnosis not present

## 2023-06-12 DIAGNOSIS — R296 Repeated falls: Secondary | ICD-10-CM | POA: Diagnosis not present

## 2023-06-12 DIAGNOSIS — R269 Unspecified abnormalities of gait and mobility: Secondary | ICD-10-CM | POA: Diagnosis not present

## 2023-06-13 DIAGNOSIS — R296 Repeated falls: Secondary | ICD-10-CM | POA: Diagnosis not present

## 2023-06-13 DIAGNOSIS — R269 Unspecified abnormalities of gait and mobility: Secondary | ICD-10-CM | POA: Diagnosis not present

## 2023-06-13 DIAGNOSIS — M6281 Muscle weakness (generalized): Secondary | ICD-10-CM | POA: Diagnosis not present

## 2023-06-13 DIAGNOSIS — R2689 Other abnormalities of gait and mobility: Secondary | ICD-10-CM | POA: Diagnosis not present

## 2023-06-14 DIAGNOSIS — R269 Unspecified abnormalities of gait and mobility: Secondary | ICD-10-CM | POA: Diagnosis not present

## 2023-06-14 DIAGNOSIS — R296 Repeated falls: Secondary | ICD-10-CM | POA: Diagnosis not present

## 2023-06-14 DIAGNOSIS — R2689 Other abnormalities of gait and mobility: Secondary | ICD-10-CM | POA: Diagnosis not present

## 2023-06-14 DIAGNOSIS — M6281 Muscle weakness (generalized): Secondary | ICD-10-CM | POA: Diagnosis not present

## 2023-06-15 DIAGNOSIS — R2689 Other abnormalities of gait and mobility: Secondary | ICD-10-CM | POA: Diagnosis not present

## 2023-06-15 DIAGNOSIS — M6281 Muscle weakness (generalized): Secondary | ICD-10-CM | POA: Diagnosis not present

## 2023-06-15 DIAGNOSIS — R269 Unspecified abnormalities of gait and mobility: Secondary | ICD-10-CM | POA: Diagnosis not present

## 2023-06-15 DIAGNOSIS — R296 Repeated falls: Secondary | ICD-10-CM | POA: Diagnosis not present

## 2023-06-18 DIAGNOSIS — R2689 Other abnormalities of gait and mobility: Secondary | ICD-10-CM | POA: Diagnosis not present

## 2023-06-18 DIAGNOSIS — M6281 Muscle weakness (generalized): Secondary | ICD-10-CM | POA: Diagnosis not present

## 2023-06-18 DIAGNOSIS — R269 Unspecified abnormalities of gait and mobility: Secondary | ICD-10-CM | POA: Diagnosis not present

## 2023-06-18 DIAGNOSIS — R296 Repeated falls: Secondary | ICD-10-CM | POA: Diagnosis not present

## 2023-06-19 DIAGNOSIS — R269 Unspecified abnormalities of gait and mobility: Secondary | ICD-10-CM | POA: Diagnosis not present

## 2023-06-19 DIAGNOSIS — R296 Repeated falls: Secondary | ICD-10-CM | POA: Diagnosis not present

## 2023-06-19 DIAGNOSIS — M6281 Muscle weakness (generalized): Secondary | ICD-10-CM | POA: Diagnosis not present

## 2023-06-19 DIAGNOSIS — R2689 Other abnormalities of gait and mobility: Secondary | ICD-10-CM | POA: Diagnosis not present

## 2023-06-20 DIAGNOSIS — M6281 Muscle weakness (generalized): Secondary | ICD-10-CM | POA: Diagnosis not present

## 2023-06-20 DIAGNOSIS — R2689 Other abnormalities of gait and mobility: Secondary | ICD-10-CM | POA: Diagnosis not present

## 2023-06-20 DIAGNOSIS — R269 Unspecified abnormalities of gait and mobility: Secondary | ICD-10-CM | POA: Diagnosis not present

## 2023-06-20 DIAGNOSIS — R296 Repeated falls: Secondary | ICD-10-CM | POA: Diagnosis not present

## 2023-06-21 DIAGNOSIS — R269 Unspecified abnormalities of gait and mobility: Secondary | ICD-10-CM | POA: Diagnosis not present

## 2023-06-21 DIAGNOSIS — R2689 Other abnormalities of gait and mobility: Secondary | ICD-10-CM | POA: Diagnosis not present

## 2023-06-21 DIAGNOSIS — M6281 Muscle weakness (generalized): Secondary | ICD-10-CM | POA: Diagnosis not present

## 2023-06-21 DIAGNOSIS — R296 Repeated falls: Secondary | ICD-10-CM | POA: Diagnosis not present

## 2023-06-23 DIAGNOSIS — R296 Repeated falls: Secondary | ICD-10-CM | POA: Diagnosis not present

## 2023-06-23 DIAGNOSIS — R2689 Other abnormalities of gait and mobility: Secondary | ICD-10-CM | POA: Diagnosis not present

## 2023-06-23 DIAGNOSIS — R269 Unspecified abnormalities of gait and mobility: Secondary | ICD-10-CM | POA: Diagnosis not present

## 2023-06-23 DIAGNOSIS — M6281 Muscle weakness (generalized): Secondary | ICD-10-CM | POA: Diagnosis not present

## 2023-06-25 DIAGNOSIS — M6281 Muscle weakness (generalized): Secondary | ICD-10-CM | POA: Diagnosis not present

## 2023-06-25 DIAGNOSIS — R269 Unspecified abnormalities of gait and mobility: Secondary | ICD-10-CM | POA: Diagnosis not present

## 2023-06-25 DIAGNOSIS — R296 Repeated falls: Secondary | ICD-10-CM | POA: Diagnosis not present

## 2023-06-25 DIAGNOSIS — R2689 Other abnormalities of gait and mobility: Secondary | ICD-10-CM | POA: Diagnosis not present

## 2023-06-26 DIAGNOSIS — R2689 Other abnormalities of gait and mobility: Secondary | ICD-10-CM | POA: Diagnosis not present

## 2023-06-26 DIAGNOSIS — R296 Repeated falls: Secondary | ICD-10-CM | POA: Diagnosis not present

## 2023-06-26 DIAGNOSIS — R269 Unspecified abnormalities of gait and mobility: Secondary | ICD-10-CM | POA: Diagnosis not present

## 2023-06-26 DIAGNOSIS — M6281 Muscle weakness (generalized): Secondary | ICD-10-CM | POA: Diagnosis not present

## 2023-06-27 DIAGNOSIS — R2689 Other abnormalities of gait and mobility: Secondary | ICD-10-CM | POA: Diagnosis not present

## 2023-06-27 DIAGNOSIS — R269 Unspecified abnormalities of gait and mobility: Secondary | ICD-10-CM | POA: Diagnosis not present

## 2023-06-27 DIAGNOSIS — R296 Repeated falls: Secondary | ICD-10-CM | POA: Diagnosis not present

## 2023-06-27 DIAGNOSIS — M6281 Muscle weakness (generalized): Secondary | ICD-10-CM | POA: Diagnosis not present

## 2023-06-28 DIAGNOSIS — M6281 Muscle weakness (generalized): Secondary | ICD-10-CM | POA: Diagnosis not present

## 2023-06-28 DIAGNOSIS — R296 Repeated falls: Secondary | ICD-10-CM | POA: Diagnosis not present

## 2023-06-28 DIAGNOSIS — R269 Unspecified abnormalities of gait and mobility: Secondary | ICD-10-CM | POA: Diagnosis not present

## 2023-06-28 DIAGNOSIS — R2689 Other abnormalities of gait and mobility: Secondary | ICD-10-CM | POA: Diagnosis not present

## 2023-06-29 DIAGNOSIS — R296 Repeated falls: Secondary | ICD-10-CM | POA: Diagnosis not present

## 2023-06-29 DIAGNOSIS — R269 Unspecified abnormalities of gait and mobility: Secondary | ICD-10-CM | POA: Diagnosis not present

## 2023-06-29 DIAGNOSIS — R2689 Other abnormalities of gait and mobility: Secondary | ICD-10-CM | POA: Diagnosis not present

## 2023-06-29 DIAGNOSIS — M6281 Muscle weakness (generalized): Secondary | ICD-10-CM | POA: Diagnosis not present

## 2023-07-31 DIAGNOSIS — H47212 Primary optic atrophy, left eye: Secondary | ICD-10-CM | POA: Diagnosis not present

## 2023-07-31 DIAGNOSIS — H401134 Primary open-angle glaucoma, bilateral, indeterminate stage: Secondary | ICD-10-CM | POA: Diagnosis not present

## 2023-09-04 DIAGNOSIS — R296 Repeated falls: Secondary | ICD-10-CM | POA: Diagnosis not present

## 2023-09-04 DIAGNOSIS — M6281 Muscle weakness (generalized): Secondary | ICD-10-CM | POA: Diagnosis not present

## 2023-09-04 DIAGNOSIS — R269 Unspecified abnormalities of gait and mobility: Secondary | ICD-10-CM | POA: Diagnosis not present

## 2023-09-04 DIAGNOSIS — R2689 Other abnormalities of gait and mobility: Secondary | ICD-10-CM | POA: Diagnosis not present

## 2023-09-05 DIAGNOSIS — M6281 Muscle weakness (generalized): Secondary | ICD-10-CM | POA: Diagnosis not present

## 2023-09-05 DIAGNOSIS — R269 Unspecified abnormalities of gait and mobility: Secondary | ICD-10-CM | POA: Diagnosis not present

## 2023-09-05 DIAGNOSIS — R296 Repeated falls: Secondary | ICD-10-CM | POA: Diagnosis not present

## 2023-09-05 DIAGNOSIS — R2689 Other abnormalities of gait and mobility: Secondary | ICD-10-CM | POA: Diagnosis not present

## 2023-09-06 DIAGNOSIS — R2689 Other abnormalities of gait and mobility: Secondary | ICD-10-CM | POA: Diagnosis not present

## 2023-09-06 DIAGNOSIS — R269 Unspecified abnormalities of gait and mobility: Secondary | ICD-10-CM | POA: Diagnosis not present

## 2023-09-06 DIAGNOSIS — R296 Repeated falls: Secondary | ICD-10-CM | POA: Diagnosis not present

## 2023-09-06 DIAGNOSIS — M6281 Muscle weakness (generalized): Secondary | ICD-10-CM | POA: Diagnosis not present

## 2023-09-07 DIAGNOSIS — R296 Repeated falls: Secondary | ICD-10-CM | POA: Diagnosis not present

## 2023-09-07 DIAGNOSIS — M6281 Muscle weakness (generalized): Secondary | ICD-10-CM | POA: Diagnosis not present

## 2023-09-07 DIAGNOSIS — R2689 Other abnormalities of gait and mobility: Secondary | ICD-10-CM | POA: Diagnosis not present

## 2023-09-07 DIAGNOSIS — R269 Unspecified abnormalities of gait and mobility: Secondary | ICD-10-CM | POA: Diagnosis not present

## 2023-09-10 DIAGNOSIS — L603 Nail dystrophy: Secondary | ICD-10-CM | POA: Diagnosis not present

## 2023-09-10 DIAGNOSIS — L602 Onychogryphosis: Secondary | ICD-10-CM | POA: Diagnosis not present

## 2023-09-10 DIAGNOSIS — R296 Repeated falls: Secondary | ICD-10-CM | POA: Diagnosis not present

## 2023-09-10 DIAGNOSIS — M6281 Muscle weakness (generalized): Secondary | ICD-10-CM | POA: Diagnosis not present

## 2023-09-10 DIAGNOSIS — R269 Unspecified abnormalities of gait and mobility: Secondary | ICD-10-CM | POA: Diagnosis not present

## 2023-09-10 DIAGNOSIS — R2689 Other abnormalities of gait and mobility: Secondary | ICD-10-CM | POA: Diagnosis not present

## 2023-09-11 DIAGNOSIS — R2689 Other abnormalities of gait and mobility: Secondary | ICD-10-CM | POA: Diagnosis not present

## 2023-09-11 DIAGNOSIS — R269 Unspecified abnormalities of gait and mobility: Secondary | ICD-10-CM | POA: Diagnosis not present

## 2023-09-11 DIAGNOSIS — M6281 Muscle weakness (generalized): Secondary | ICD-10-CM | POA: Diagnosis not present

## 2023-09-11 DIAGNOSIS — R296 Repeated falls: Secondary | ICD-10-CM | POA: Diagnosis not present

## 2023-09-13 DIAGNOSIS — R2689 Other abnormalities of gait and mobility: Secondary | ICD-10-CM | POA: Diagnosis not present

## 2023-09-13 DIAGNOSIS — R296 Repeated falls: Secondary | ICD-10-CM | POA: Diagnosis not present

## 2023-09-13 DIAGNOSIS — M6281 Muscle weakness (generalized): Secondary | ICD-10-CM | POA: Diagnosis not present

## 2023-09-13 DIAGNOSIS — R269 Unspecified abnormalities of gait and mobility: Secondary | ICD-10-CM | POA: Diagnosis not present

## 2023-09-14 DIAGNOSIS — R269 Unspecified abnormalities of gait and mobility: Secondary | ICD-10-CM | POA: Diagnosis not present

## 2023-09-14 DIAGNOSIS — M6281 Muscle weakness (generalized): Secondary | ICD-10-CM | POA: Diagnosis not present

## 2023-09-14 DIAGNOSIS — R296 Repeated falls: Secondary | ICD-10-CM | POA: Diagnosis not present

## 2023-09-14 DIAGNOSIS — R2689 Other abnormalities of gait and mobility: Secondary | ICD-10-CM | POA: Diagnosis not present

## 2023-09-15 DIAGNOSIS — M6281 Muscle weakness (generalized): Secondary | ICD-10-CM | POA: Diagnosis not present

## 2023-09-15 DIAGNOSIS — R296 Repeated falls: Secondary | ICD-10-CM | POA: Diagnosis not present

## 2023-09-15 DIAGNOSIS — R2689 Other abnormalities of gait and mobility: Secondary | ICD-10-CM | POA: Diagnosis not present

## 2023-09-15 DIAGNOSIS — R269 Unspecified abnormalities of gait and mobility: Secondary | ICD-10-CM | POA: Diagnosis not present

## 2023-09-16 DIAGNOSIS — R269 Unspecified abnormalities of gait and mobility: Secondary | ICD-10-CM | POA: Diagnosis not present

## 2023-09-16 DIAGNOSIS — R296 Repeated falls: Secondary | ICD-10-CM | POA: Diagnosis not present

## 2023-09-16 DIAGNOSIS — R2689 Other abnormalities of gait and mobility: Secondary | ICD-10-CM | POA: Diagnosis not present

## 2023-09-16 DIAGNOSIS — M6281 Muscle weakness (generalized): Secondary | ICD-10-CM | POA: Diagnosis not present

## 2023-09-17 DIAGNOSIS — R296 Repeated falls: Secondary | ICD-10-CM | POA: Diagnosis not present

## 2023-09-17 DIAGNOSIS — R2689 Other abnormalities of gait and mobility: Secondary | ICD-10-CM | POA: Diagnosis not present

## 2023-09-17 DIAGNOSIS — R269 Unspecified abnormalities of gait and mobility: Secondary | ICD-10-CM | POA: Diagnosis not present

## 2023-09-17 DIAGNOSIS — M6281 Muscle weakness (generalized): Secondary | ICD-10-CM | POA: Diagnosis not present

## 2023-09-18 DIAGNOSIS — R296 Repeated falls: Secondary | ICD-10-CM | POA: Diagnosis not present

## 2023-09-18 DIAGNOSIS — R269 Unspecified abnormalities of gait and mobility: Secondary | ICD-10-CM | POA: Diagnosis not present

## 2023-09-18 DIAGNOSIS — R2689 Other abnormalities of gait and mobility: Secondary | ICD-10-CM | POA: Diagnosis not present

## 2023-09-18 DIAGNOSIS — M6281 Muscle weakness (generalized): Secondary | ICD-10-CM | POA: Diagnosis not present

## 2023-09-20 DIAGNOSIS — R2689 Other abnormalities of gait and mobility: Secondary | ICD-10-CM | POA: Diagnosis not present

## 2023-09-20 DIAGNOSIS — M6281 Muscle weakness (generalized): Secondary | ICD-10-CM | POA: Diagnosis not present

## 2023-09-20 DIAGNOSIS — R269 Unspecified abnormalities of gait and mobility: Secondary | ICD-10-CM | POA: Diagnosis not present

## 2023-09-20 DIAGNOSIS — R296 Repeated falls: Secondary | ICD-10-CM | POA: Diagnosis not present

## 2023-09-21 DIAGNOSIS — R296 Repeated falls: Secondary | ICD-10-CM | POA: Diagnosis not present

## 2023-09-21 DIAGNOSIS — M6281 Muscle weakness (generalized): Secondary | ICD-10-CM | POA: Diagnosis not present

## 2023-09-21 DIAGNOSIS — R269 Unspecified abnormalities of gait and mobility: Secondary | ICD-10-CM | POA: Diagnosis not present

## 2023-09-21 DIAGNOSIS — R2689 Other abnormalities of gait and mobility: Secondary | ICD-10-CM | POA: Diagnosis not present

## 2023-09-24 DIAGNOSIS — R2689 Other abnormalities of gait and mobility: Secondary | ICD-10-CM | POA: Diagnosis not present

## 2023-09-24 DIAGNOSIS — R296 Repeated falls: Secondary | ICD-10-CM | POA: Diagnosis not present

## 2023-09-24 DIAGNOSIS — M6281 Muscle weakness (generalized): Secondary | ICD-10-CM | POA: Diagnosis not present

## 2023-09-24 DIAGNOSIS — R269 Unspecified abnormalities of gait and mobility: Secondary | ICD-10-CM | POA: Diagnosis not present

## 2023-09-25 DIAGNOSIS — M6281 Muscle weakness (generalized): Secondary | ICD-10-CM | POA: Diagnosis not present

## 2023-09-25 DIAGNOSIS — R269 Unspecified abnormalities of gait and mobility: Secondary | ICD-10-CM | POA: Diagnosis not present

## 2023-09-25 DIAGNOSIS — R2689 Other abnormalities of gait and mobility: Secondary | ICD-10-CM | POA: Diagnosis not present

## 2023-09-25 DIAGNOSIS — R296 Repeated falls: Secondary | ICD-10-CM | POA: Diagnosis not present

## 2023-09-26 DIAGNOSIS — R296 Repeated falls: Secondary | ICD-10-CM | POA: Diagnosis not present

## 2023-09-26 DIAGNOSIS — R2689 Other abnormalities of gait and mobility: Secondary | ICD-10-CM | POA: Diagnosis not present

## 2023-09-26 DIAGNOSIS — R269 Unspecified abnormalities of gait and mobility: Secondary | ICD-10-CM | POA: Diagnosis not present

## 2023-09-26 DIAGNOSIS — M6281 Muscle weakness (generalized): Secondary | ICD-10-CM | POA: Diagnosis not present

## 2023-09-27 DIAGNOSIS — R2689 Other abnormalities of gait and mobility: Secondary | ICD-10-CM | POA: Diagnosis not present

## 2023-09-27 DIAGNOSIS — M6281 Muscle weakness (generalized): Secondary | ICD-10-CM | POA: Diagnosis not present

## 2023-09-27 DIAGNOSIS — R296 Repeated falls: Secondary | ICD-10-CM | POA: Diagnosis not present

## 2023-09-27 DIAGNOSIS — R269 Unspecified abnormalities of gait and mobility: Secondary | ICD-10-CM | POA: Diagnosis not present

## 2023-09-28 DIAGNOSIS — R296 Repeated falls: Secondary | ICD-10-CM | POA: Diagnosis not present

## 2023-09-28 DIAGNOSIS — R2689 Other abnormalities of gait and mobility: Secondary | ICD-10-CM | POA: Diagnosis not present

## 2023-09-28 DIAGNOSIS — R269 Unspecified abnormalities of gait and mobility: Secondary | ICD-10-CM | POA: Diagnosis not present

## 2023-09-28 DIAGNOSIS — M6281 Muscle weakness (generalized): Secondary | ICD-10-CM | POA: Diagnosis not present

## 2023-10-01 DIAGNOSIS — R296 Repeated falls: Secondary | ICD-10-CM | POA: Diagnosis not present

## 2023-10-01 DIAGNOSIS — M6281 Muscle weakness (generalized): Secondary | ICD-10-CM | POA: Diagnosis not present

## 2023-10-01 DIAGNOSIS — R269 Unspecified abnormalities of gait and mobility: Secondary | ICD-10-CM | POA: Diagnosis not present

## 2023-10-01 DIAGNOSIS — R2689 Other abnormalities of gait and mobility: Secondary | ICD-10-CM | POA: Diagnosis not present

## 2023-10-02 DIAGNOSIS — M6281 Muscle weakness (generalized): Secondary | ICD-10-CM | POA: Diagnosis not present

## 2023-10-02 DIAGNOSIS — R2689 Other abnormalities of gait and mobility: Secondary | ICD-10-CM | POA: Diagnosis not present

## 2023-10-02 DIAGNOSIS — R296 Repeated falls: Secondary | ICD-10-CM | POA: Diagnosis not present

## 2023-10-02 DIAGNOSIS — R269 Unspecified abnormalities of gait and mobility: Secondary | ICD-10-CM | POA: Diagnosis not present

## 2023-10-31 ENCOUNTER — Telehealth: Payer: Self-pay

## 2023-10-31 NOTE — Telephone Encounter (Signed)
 I have left a message on their machine to have them return call to make an appointment or let us  know where they have their PCP.

## 2023-11-15 NOTE — Telephone Encounter (Signed)
 Per [facility] , patient is living in a nursing home [place]. He sees PCP at [facility]ucility.   Julian Lemmings LPN
